# Patient Record
Sex: Female | Born: 1960
Health system: Southern US, Community
[De-identification: ages and names within clinical notes are randomized; demographics above are authoritative.]

## PROBLEM LIST (undated history)

## (undated) DIAGNOSIS — F419 Anxiety disorder, unspecified: Secondary | ICD-10-CM

## (undated) DIAGNOSIS — J45909 Unspecified asthma, uncomplicated: Secondary | ICD-10-CM

## (undated) DIAGNOSIS — I6350 Cerebral infarction due to unspecified occlusion or stenosis of unspecified cerebral artery: Secondary | ICD-10-CM

## (undated) DIAGNOSIS — E559 Vitamin D deficiency, unspecified: Secondary | ICD-10-CM

## (undated) DIAGNOSIS — I63239 Cerebral infarction due to unspecified occlusion or stenosis of unspecified carotid arteries: Secondary | ICD-10-CM

## (undated) DIAGNOSIS — I10 Essential (primary) hypertension: Secondary | ICD-10-CM

## (undated) DIAGNOSIS — R51 Headache: Secondary | ICD-10-CM

## (undated) DIAGNOSIS — D649 Anemia, unspecified: Secondary | ICD-10-CM

## (undated) DIAGNOSIS — M62838 Other muscle spasm: Secondary | ICD-10-CM

## (undated) DIAGNOSIS — F329 Major depressive disorder, single episode, unspecified: Secondary | ICD-10-CM

## (undated) DIAGNOSIS — R12 Heartburn: Secondary | ICD-10-CM

## (undated) DIAGNOSIS — E039 Hypothyroidism, unspecified: Secondary | ICD-10-CM

## (undated) DIAGNOSIS — F32A Depression, unspecified: Secondary | ICD-10-CM

## (undated) DIAGNOSIS — G43909 Migraine, unspecified, not intractable, without status migrainosus: Secondary | ICD-10-CM

## (undated) DIAGNOSIS — E7800 Pure hypercholesterolemia, unspecified: Secondary | ICD-10-CM

## (undated) HISTORY — DX: Other muscle spasm: M62.838

## (undated) HISTORY — DX: Cerebral infarction due to unspecified occlusion or stenosis of unspecified carotid artery: I63.239

## (undated) HISTORY — DX: Migraine, unspecified, not intractable, without status migrainosus: G43.909

## (undated) HISTORY — PX: VAGINAL HYSTERECTOMY: SUR661

## (undated) HISTORY — DX: Anxiety disorder, unspecified: F41.9

## (undated) HISTORY — DX: Cerebral infarction due to unspecified occlusion or stenosis of unspecified cerebral artery: I63.50

## (undated) HISTORY — PX: COSMETIC SURGERY: SHX468

## (undated) HISTORY — DX: Essential (primary) hypertension: I10

## (undated) HISTORY — DX: Vitamin D deficiency, unspecified: E55.9

## (undated) HISTORY — PX: NASAL SINUS SURGERY: SHX719

## (undated) HISTORY — DX: Anemia, unspecified: D64.9

---

## 1997-06-24 ENCOUNTER — Other Ambulatory Visit: Admission: RE | Admit: 1997-06-24 | Discharge: 1997-06-24 | Payer: Self-pay | Admitting: Obstetrics and Gynecology

## 1998-03-29 HISTORY — PX: AUGMENTATION MAMMAPLASTY: SUR837

## 1998-08-20 ENCOUNTER — Encounter: Payer: Self-pay | Admitting: Internal Medicine

## 1998-08-20 ENCOUNTER — Ambulatory Visit (HOSPITAL_COMMUNITY): Admission: RE | Admit: 1998-08-20 | Discharge: 1998-08-20 | Payer: Self-pay | Admitting: Internal Medicine

## 2000-01-08 ENCOUNTER — Other Ambulatory Visit: Admission: RE | Admit: 2000-01-08 | Discharge: 2000-01-08 | Payer: Self-pay | Admitting: Obstetrics and Gynecology

## 2000-02-17 ENCOUNTER — Ambulatory Visit (HOSPITAL_COMMUNITY): Admission: RE | Admit: 2000-02-17 | Discharge: 2000-02-17 | Payer: Self-pay | Admitting: Internal Medicine

## 2000-02-17 ENCOUNTER — Encounter: Payer: Self-pay | Admitting: Internal Medicine

## 2000-06-10 ENCOUNTER — Encounter: Payer: Self-pay | Admitting: Internal Medicine

## 2000-06-10 ENCOUNTER — Ambulatory Visit (HOSPITAL_COMMUNITY): Admission: RE | Admit: 2000-06-10 | Discharge: 2000-06-10 | Payer: Self-pay | Admitting: Internal Medicine

## 2000-06-13 ENCOUNTER — Encounter: Admission: RE | Admit: 2000-06-13 | Discharge: 2000-06-13 | Payer: Self-pay | Admitting: Internal Medicine

## 2000-06-13 ENCOUNTER — Encounter: Payer: Self-pay | Admitting: Internal Medicine

## 2000-08-31 ENCOUNTER — Ambulatory Visit (HOSPITAL_COMMUNITY): Admission: RE | Admit: 2000-08-31 | Discharge: 2000-08-31 | Payer: Self-pay | Admitting: Internal Medicine

## 2000-08-31 ENCOUNTER — Encounter: Payer: Self-pay | Admitting: Internal Medicine

## 2001-06-15 ENCOUNTER — Encounter: Payer: Self-pay | Admitting: Obstetrics and Gynecology

## 2001-06-15 ENCOUNTER — Ambulatory Visit (HOSPITAL_COMMUNITY): Admission: RE | Admit: 2001-06-15 | Discharge: 2001-06-15 | Payer: Self-pay | Admitting: Obstetrics and Gynecology

## 2001-06-20 ENCOUNTER — Encounter
Admission: RE | Admit: 2001-06-20 | Discharge: 2001-06-20 | Payer: Self-pay | Admitting: Physical Medicine & Rehabilitation

## 2001-06-20 ENCOUNTER — Encounter: Payer: Self-pay | Admitting: Physical Medicine & Rehabilitation

## 2002-01-23 ENCOUNTER — Other Ambulatory Visit: Admission: RE | Admit: 2002-01-23 | Discharge: 2002-01-23 | Payer: Self-pay | Admitting: Internal Medicine

## 2002-02-20 ENCOUNTER — Ambulatory Visit (HOSPITAL_BASED_OUTPATIENT_CLINIC_OR_DEPARTMENT_OTHER): Admission: RE | Admit: 2002-02-20 | Discharge: 2002-02-20 | Payer: Self-pay | Admitting: Orthopedic Surgery

## 2002-10-03 ENCOUNTER — Ambulatory Visit (HOSPITAL_COMMUNITY): Admission: RE | Admit: 2002-10-03 | Discharge: 2002-10-03 | Payer: Self-pay | Admitting: Obstetrics and Gynecology

## 2002-10-03 ENCOUNTER — Encounter: Payer: Self-pay | Admitting: Obstetrics and Gynecology

## 2002-11-08 ENCOUNTER — Emergency Department (HOSPITAL_COMMUNITY): Admission: EM | Admit: 2002-11-08 | Discharge: 2002-11-08 | Payer: Self-pay | Admitting: Emergency Medicine

## 2002-11-08 ENCOUNTER — Encounter: Payer: Self-pay | Admitting: Emergency Medicine

## 2002-11-20 ENCOUNTER — Encounter: Admission: RE | Admit: 2002-11-20 | Discharge: 2002-11-20 | Payer: Self-pay | Admitting: *Deleted

## 2002-11-20 ENCOUNTER — Encounter: Payer: Self-pay | Admitting: *Deleted

## 2002-12-27 ENCOUNTER — Other Ambulatory Visit: Admission: RE | Admit: 2002-12-27 | Discharge: 2002-12-27 | Payer: Self-pay | Admitting: Internal Medicine

## 2003-09-19 ENCOUNTER — Encounter: Admission: RE | Admit: 2003-09-19 | Discharge: 2003-09-19 | Payer: Self-pay | Admitting: Orthopedic Surgery

## 2003-10-11 ENCOUNTER — Encounter: Admission: RE | Admit: 2003-10-11 | Discharge: 2003-10-11 | Payer: Self-pay | Admitting: Orthopedic Surgery

## 2004-03-31 ENCOUNTER — Ambulatory Visit (HOSPITAL_COMMUNITY): Admission: RE | Admit: 2004-03-31 | Discharge: 2004-03-31 | Payer: Self-pay | Admitting: Internal Medicine

## 2004-11-09 ENCOUNTER — Ambulatory Visit (HOSPITAL_COMMUNITY): Admission: RE | Admit: 2004-11-09 | Discharge: 2004-11-09 | Payer: Self-pay | Admitting: Internal Medicine

## 2005-01-06 ENCOUNTER — Other Ambulatory Visit: Admission: RE | Admit: 2005-01-06 | Discharge: 2005-01-06 | Payer: Self-pay | Admitting: Internal Medicine

## 2005-02-25 ENCOUNTER — Ambulatory Visit (HOSPITAL_COMMUNITY): Admission: RE | Admit: 2005-02-25 | Discharge: 2005-02-25 | Payer: Self-pay | Admitting: Internal Medicine

## 2005-05-05 ENCOUNTER — Ambulatory Visit (HOSPITAL_COMMUNITY): Admission: RE | Admit: 2005-05-05 | Discharge: 2005-05-06 | Payer: Self-pay | Admitting: Surgery

## 2005-05-05 ENCOUNTER — Encounter (INDEPENDENT_AMBULATORY_CARE_PROVIDER_SITE_OTHER): Payer: Self-pay | Admitting: *Deleted

## 2005-05-31 ENCOUNTER — Ambulatory Visit (HOSPITAL_COMMUNITY): Admission: RE | Admit: 2005-05-31 | Discharge: 2005-05-31 | Payer: Self-pay | Admitting: Internal Medicine

## 2005-09-14 ENCOUNTER — Ambulatory Visit (HOSPITAL_COMMUNITY): Admission: RE | Admit: 2005-09-14 | Discharge: 2005-09-14 | Payer: Self-pay | Admitting: Internal Medicine

## 2007-06-23 ENCOUNTER — Ambulatory Visit (HOSPITAL_COMMUNITY): Admission: RE | Admit: 2007-06-23 | Discharge: 2007-06-23 | Payer: Self-pay | Admitting: Internal Medicine

## 2007-07-20 ENCOUNTER — Other Ambulatory Visit: Admission: RE | Admit: 2007-07-20 | Discharge: 2007-07-20 | Payer: Self-pay | Admitting: Obstetrics and Gynecology

## 2007-08-29 ENCOUNTER — Ambulatory Visit: Payer: Self-pay | Admitting: Internal Medicine

## 2007-08-29 ENCOUNTER — Inpatient Hospital Stay (HOSPITAL_COMMUNITY): Admission: EM | Admit: 2007-08-29 | Discharge: 2007-09-04 | Payer: Self-pay | Admitting: Emergency Medicine

## 2007-08-29 ENCOUNTER — Encounter (INDEPENDENT_AMBULATORY_CARE_PROVIDER_SITE_OTHER): Payer: Self-pay | Admitting: Pediatrics

## 2007-09-01 ENCOUNTER — Encounter (INDEPENDENT_AMBULATORY_CARE_PROVIDER_SITE_OTHER): Payer: Self-pay | Admitting: Pediatrics

## 2007-09-01 ENCOUNTER — Ambulatory Visit: Payer: Self-pay | Admitting: Physical Medicine & Rehabilitation

## 2007-09-04 ENCOUNTER — Inpatient Hospital Stay (HOSPITAL_COMMUNITY)
Admission: RE | Admit: 2007-09-04 | Discharge: 2007-09-21 | Payer: Self-pay | Admitting: Physical Medicine & Rehabilitation

## 2007-09-25 ENCOUNTER — Encounter
Admission: RE | Admit: 2007-09-25 | Discharge: 2007-12-24 | Payer: Self-pay | Admitting: Physical Medicine & Rehabilitation

## 2007-10-24 ENCOUNTER — Encounter: Admission: RE | Admit: 2007-10-24 | Discharge: 2007-10-24 | Payer: Self-pay | Admitting: Orthopedic Surgery

## 2007-10-26 ENCOUNTER — Ambulatory Visit: Payer: Self-pay | Admitting: Physical Medicine & Rehabilitation

## 2007-10-26 ENCOUNTER — Encounter
Admission: RE | Admit: 2007-10-26 | Discharge: 2008-01-01 | Payer: Self-pay | Admitting: Physical Medicine & Rehabilitation

## 2007-11-09 ENCOUNTER — Encounter: Admission: RE | Admit: 2007-11-09 | Discharge: 2007-11-09 | Payer: Self-pay | Admitting: Orthopedic Surgery

## 2007-12-01 ENCOUNTER — Encounter: Admission: RE | Admit: 2007-12-01 | Discharge: 2007-12-01 | Payer: Self-pay | Admitting: Orthopedic Surgery

## 2007-12-25 ENCOUNTER — Encounter
Admission: RE | Admit: 2007-12-25 | Discharge: 2008-02-19 | Payer: Self-pay | Admitting: Physical Medicine & Rehabilitation

## 2008-01-03 ENCOUNTER — Encounter
Admission: RE | Admit: 2008-01-03 | Discharge: 2008-01-10 | Payer: Self-pay | Admitting: Physical Medicine & Rehabilitation

## 2008-01-04 ENCOUNTER — Ambulatory Visit: Payer: Self-pay | Admitting: Physical Medicine & Rehabilitation

## 2008-01-04 ENCOUNTER — Encounter
Admission: RE | Admit: 2008-01-04 | Discharge: 2008-01-04 | Payer: Self-pay | Admitting: Physical Medicine & Rehabilitation

## 2008-01-16 ENCOUNTER — Encounter: Admission: RE | Admit: 2008-01-16 | Discharge: 2008-02-13 | Payer: Self-pay | Admitting: Psychology

## 2008-04-19 ENCOUNTER — Encounter
Admission: RE | Admit: 2008-04-19 | Discharge: 2008-07-18 | Payer: Self-pay | Admitting: Physical Medicine & Rehabilitation

## 2008-04-25 ENCOUNTER — Ambulatory Visit: Payer: Self-pay | Admitting: Physical Medicine & Rehabilitation

## 2008-07-22 ENCOUNTER — Encounter
Admission: RE | Admit: 2008-07-22 | Discharge: 2008-07-24 | Payer: Self-pay | Admitting: Physical Medicine & Rehabilitation

## 2008-07-24 ENCOUNTER — Ambulatory Visit: Payer: Self-pay | Admitting: Physical Medicine & Rehabilitation

## 2008-11-13 ENCOUNTER — Ambulatory Visit: Payer: Self-pay | Admitting: Physical Medicine & Rehabilitation

## 2008-11-13 ENCOUNTER — Encounter
Admission: RE | Admit: 2008-11-13 | Discharge: 2009-02-11 | Payer: Self-pay | Admitting: Physical Medicine & Rehabilitation

## 2008-11-27 ENCOUNTER — Ambulatory Visit (HOSPITAL_COMMUNITY): Admission: RE | Admit: 2008-11-27 | Discharge: 2008-11-27 | Payer: Self-pay | Admitting: Obstetrics and Gynecology

## 2009-01-17 ENCOUNTER — Ambulatory Visit: Payer: Self-pay | Admitting: Physical Medicine & Rehabilitation

## 2009-07-11 ENCOUNTER — Encounter
Admission: RE | Admit: 2009-07-11 | Discharge: 2009-07-15 | Payer: Self-pay | Admitting: Physical Medicine & Rehabilitation

## 2009-07-15 ENCOUNTER — Ambulatory Visit: Payer: Self-pay | Admitting: Physical Medicine & Rehabilitation

## 2009-10-28 ENCOUNTER — Other Ambulatory Visit: Admission: RE | Admit: 2009-10-28 | Discharge: 2009-10-28 | Payer: Self-pay | Admitting: Internal Medicine

## 2009-11-03 ENCOUNTER — Encounter (INDEPENDENT_AMBULATORY_CARE_PROVIDER_SITE_OTHER): Payer: Self-pay | Admitting: *Deleted

## 2009-11-28 ENCOUNTER — Ambulatory Visit (HOSPITAL_COMMUNITY): Admission: RE | Admit: 2009-11-28 | Discharge: 2009-11-28 | Payer: Self-pay | Admitting: Internal Medicine

## 2010-03-04 ENCOUNTER — Encounter (INDEPENDENT_AMBULATORY_CARE_PROVIDER_SITE_OTHER): Payer: Self-pay | Admitting: Obstetrics and Gynecology

## 2010-03-04 ENCOUNTER — Ambulatory Visit (HOSPITAL_COMMUNITY)
Admission: RE | Admit: 2010-03-04 | Discharge: 2010-03-05 | Payer: Self-pay | Source: Home / Self Care | Attending: Obstetrics and Gynecology | Admitting: Obstetrics and Gynecology

## 2010-04-30 NOTE — Letter (Signed)
Summary: Previsit letter  [facility]  [address] [place], [place] [address]   Phone: [phone]  Fax: [phone]       11/03/2009 MRN: [id number]  Ruth Gregory [address] [place], [place]  [address]  Dear Ms. Burrous,  Welcome to the Gastroenterology Division at [facility].    You are scheduled to see a nurse for your pre-procedure visit on 12-03-09 at 11AM on the 3rd floor at [facility], [address].  We ask that you try to arrive at our office 15 minutes prior to your appointment time to allow for check-in.  Your nurse visit will consist of discussing your medical and surgical history, your immediate family medical history, and your medications.    Please bring a complete list of all your medications or, if you prefer, bring the medication bottles and we will list them.  We will need to be aware of both prescribed and over the counter drugs.  We will need to know exact dosage information as well.  If you are on blood thinners (Coumadin, Plavix, Aggrenox, Ticlid, etc.) please call our office today/prior to your appointment, as we need to consult with your physician about holding your medication.   Please be prepared to read and sign documents such as consent forms, a financial agreement, and acknowledgement forms.  If necessary, and with your consent, a friend or relative is welcome to sit-in on the nurse visit with you.  Please bring your insurance card so that we may make a copy of it.  If your insurance requires a referral to see a specialist, please bring your referral form from your primary care physician.  No co-pay is required for this nurse visit.     If you cannot keep your appointment, please call [phone] to cancel or reschedule prior to your appointment date.  This allows [place] the opportunity to schedule an appointment for another patient in need of care.    Thank you for choosing [facility] for your medical needs.  We  appreciate the opportunity to care for you.  Please visit [place] at our website  to learn more about our practice.                     Sincerely.                                                                                                                   The Gastroenterology Division

## 2010-06-09 LAB — PREGNANCY, URINE: Preg Test, Ur: NEGATIVE

## 2010-06-09 LAB — BASIC METABOLIC PANEL
BUN: 19 mg/dL (ref 6–23)
CO2: 26 mEq/L (ref 19–32)
Calcium: 9.7 mg/dL (ref 8.4–10.5)
Chloride: 106 mEq/L (ref 96–112)
Creatinine, Ser: 1.12 mg/dL (ref 0.4–1.2)
GFR calc Af Amer: >60 mL/min (ref >60)
GFR calc non Af Amer: 51 mL/min — ABNORMAL LOW (ref >60)
Glucose, Bld: 66 mg/dL — ABNORMAL LOW (ref 70–99)
Potassium: 4.2 mEq/L (ref 3.5–5.1)
Sodium: 139 mEq/L (ref 135–145)

## 2010-06-09 LAB — CBC
HCT: 32.2 % — ABNORMAL LOW (ref 36.0–46.0)
HCT: 37.8 % (ref 36.0–46.0)
Hemoglobin: 11.4 g/dL — ABNORMAL LOW (ref 12.0–15.0)
Hemoglobin: 13.2 g/dL (ref 12.0–15.0)
MCH: 33.3 pg (ref 26.0–34.0)
MCH: 33.8 pg (ref 26.0–34.0)
MCHC: 34.8 g/dL (ref 30.0–36.0)
MCHC: 35.3 g/dL (ref 30.0–36.0)
MCV: 95.6 fL (ref 78.0–100.0)
MCV: 95.7 fL (ref 78.0–100.0)
Platelets: 164 10*3/uL (ref 150–400)
Platelets: 176 10*3/uL (ref 150–400)
RBC: 3.36 MIL/uL — ABNORMAL LOW (ref 3.87–5.11)
RBC: 3.96 MIL/uL (ref 3.87–5.11)
RDW: 12.3 % (ref 11.5–15.5)
RDW: 12.4 % (ref 11.5–15.5)
WBC: 10 10*3/uL (ref 4.0–10.5)
WBC: 4.6 10*3/uL (ref 4.0–10.5)

## 2010-06-09 LAB — SURGICAL PCR SCREEN
MRSA, PCR: NEGATIVE
Staphylococcus aureus: NEGATIVE

## 2010-08-11 NOTE — H&P (Signed)
NAME:  Ruth Gregory, Ruth Gregory NO.:  [id number]   MEDICAL RECORD NO.:  [id number]          PATIENT TYPE:  INP   LOCATION:  3107                         FACILITY:  [facility]   PHYSICIAN:  Deanna Artis. Hickling, M.D.DATE OF BIRTH:  Dec 20, 1960   DATE OF ADMISSION:  08/28/2007  DATE OF DISCHARGE:                              HISTORY & PHYSICAL   CHIEF COMPLAINT:  Left hemiparesis.   HISTORY OF PRESENT CONDITION:  This is a 50 year old woman last known  normal at 2145 hours.  Presented for her fall when she got out of her  chair around at 2230 hours.  She arrived as a code stroke, time of onset  unknown (at 2345 hours).  She had a CT scan at 1231 and this was  reported at 1250 to Dr. Rubin Payor who called me at 1254.  The patient  had a dense right middle cerebral artery sign that was evident without  evidence of cerebral edema.   PAST MEDICAL HISTORY:  Migraine headaches.  The patient had a migraine  this morning and required treatment at [facility].  She  recovered completely.   PAST SURGICAL HISTORY:  Breast implants and goiter removed.   PAST MEDICAL HISTORY:  Also remarkable for hypothyroidism.   CURRENT MEDICATIONS:  Synthroid.  The patient was on prophylactic  migraine medicine.   DRUG ALLERGIES:  PENICILLIN.   FAMILY HISTORY:  Negative for migraines and stroke.   SOCIAL HISTORY:  The patient has been married for 18 years.  She has 2  children.  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  She has had 1 year of college.  She does not use  tobacco.  Drinks alcohol sparingly.  Does not use drugs.   REVIEW OF SYSTEMS:  Twelve systems review is remarkable only for her  migraine and her hypothyroidism.   PHYSICAL EXAMINATION:  VITAL SIGNS:  Temperature 97.1, blood pressure  119/77, heart rate 77, respirations 20, oxygen saturation 97%, height 5  feet 10 inches, weight 157 pounds.  The patient has no cranial or  cervical bruits.   No signs of infection.  LUNGS:  Clear.  HEART:  No murmurs.  Pulses normal.  ABDOMEN:  Bowel sounds normal.  No hepatosplenomegaly.  NEUROLOGIC:  The patient is awake and alert.  She has no aphasia.  She  has dysarthria.  She has a right gaze preference and left gaze paresis,  but not forced duction of her eyes.  She has a dense left homonymous  hemianopsia.  She has a dense left central seventh.  She can protrude  her tongue at midline.  She is plegic on the left side.  She has a  flaccid left arm and an increased tone in the left leg.  She has a left  extensor plantar response.  Reflexes show a left reflex predominance.  She does not have dense neglect of the left arm but neglected to double  simultaneous stimuli.   Her NIH stroke scale was 19.  Her premorbid modified Rankin score was  0.   TEST RESULTS:  Sodium 139, potassium 3.9, chloride 106, CO2 22, BUN 13,  creatinine 1.0, glucose 130.  Hemoglobin 14.1, hematocrit 40.3, white  count 8400, platelets 141,000, MCV 95.3.  PT/PTT is drawn and is pending  at this time.   IMPRESSION:  Embolic infarction to the Right Middle Cerebral Artery  evaluate for right internal carotid disease such as fibromuscular  dysplasia or carotid dissection.  (434.11)   PLAN:  The patient received a bolus dose of t-PA at 1:50 a.m. and two-  third t-PA is being drifting over an hour.  She will have an angiogram  and then proceed to either into extraction device, either Merci or  Penumbra.  Her condition is serious at this time.  I have informed her  husband that the risks of bleeding and possible death as a result of  progression of her stroke with herniation.  She will be placed in  intensive care unit after her procedure.      Deanna Artis. Sharene Skeans, M.D.  Electronically Signed     [facility]/MEDQ  D:  08/29/2007  T:  08/29/2007  Job:  [id number]

## 2010-08-11 NOTE — Discharge Summary (Signed)
NAME:  Ruth Gregory, Ruth Gregory NO.:  [id number]   MEDICAL RECORD NO.:  [id number]          PATIENT TYPE:  IPS   LOCATION:  4033                         FACILITY:  [facility]   PHYSICIAN:  Ellwood Dense, M.D.   DATE OF BIRTH:  September 08, 1960   DATE OF ADMISSION:  09/04/2007  DATE OF DISCHARGE:  09/21/2007                               DISCHARGE SUMMARY   DISCHARGE DIAGNOSES:  1. 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  2. Dysphagia.  3. History of migraine headaches.  4. Hyperlipidemia.  5. Hypothyroidism.  6. Subcutaneous Lovenox for deep vein thrombosis prophylaxis.   HISTORY OF PRESENT ILLNESS:  This is a 50 year old female with a history  of migraine headaches, admitted on August 29, 2007 with left-sided  weakness.  MRI showed right middle cerebral artery infarction.  Angiogram showed occluded right internal carotid artery and large clot  to the right internal carotid artery.  Underwent stenting and  revascularization per Dr. Corliss Skains.  Neurology services, Dr. Sharene Skeans.  The patient did receive TPA.  Remained intubated with a slow wean.  Maintained on aspirin and subcutaneous Lovenox.  No Plavix or Coumadin  secondary to a small hemorrhagic bleed identified on follow up cranial  CT scan after stenting on August 31, 2007.  Modified barium swallow placed  on a dysphagia one nectar thick liquid diet.  Depakote added for severe  migraine headaches.   PAST MEDICAL HISTORY:  See discharge diagnoses.  Occasional alcohol.  No  tobacco.   ALLERGIES:  PENICILLIN AND SULFA.   SOCIAL HISTORY:  Married.  She works as a Contractor at [employer].  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  Spouse can assist on discharge in a one  level home with two steps to entry.   MEDICATIONS PRIOR TO ADMISSION:  1. Synthroid.  2. Zomig for migraine headaches.  3. Lexapro.  4. Xanax.  5. Wellbutrin.  6. Birth control pills.  7. Oxycodone immediate release  as needed for migraine headaches.   REHABILITATION HOSPITAL COURSE:  The patient was admitted to Inpatient  Rehab Services with therapies initiated on a 3-hour daily basis  consisting of physical therapy, occupational therapy, speech therapy,  and rehabilitation nursing.  The following issues were addressed during  the patient's rehabilitation stay.  Pertaining to Ms. Macha's right  middle cerebral artery infarction with right internal carotid artery  dissection.  She remained stable participating in therapy.  She would  follow up with Dr. Sharene Skeans of neurology services as well as Dr.  Corliss Skains on discharge.  She was minimal assist for transfers, minimum-  to-moderate assist for ambulation, minimal assist with standing balance,  minimum-to-moderate assist for stairs, minimal assist for bathing,  minimal assist upper body dressing as well as lower body dressing.  Home  health therapies would be ongoing as per [employer].  She was  continent of bowel and bladder.  She was followed by speech therapy with  her diet status slowly advanced to a regular consistency, which she  tolerated well.  She remained on  Depakote at bedtime as well as Percocet  as needed for migraine headaches.  TriCor for hyperlipidemia with latest  cholesterol of 158 and triglycerides of 572.  She remained on Synthroid  for hypothyroidism.  Subcutaneous Lovenox for deep vein thrombosis  prophylaxis was discontinued time of discharge.  She would remain on  aspirin therapy for cerebrovascular accident prophylaxis.   Latest labs showed hemoglobin 14, hematocrit of 40, and platelet  243,000.  Sodium 140, potassium 4.3, BUN 6, and creatinine 0.8.  Liver  function studies were within normal limits.  Full family teaching was  completed and she was discharged to home with her family.   DISCHARGE MEDICATIONS AT TIME OF DICTATION:  1. TriCor 145 mg daily.  2. Synthroid 100 mcg daily.  3. Protonix 40 mg daily.  4. Aspirin  325 mg daily.  5. Depakote 500 mg at bedtime.  6. Magnesium oxide 400 mg daily.  7. Vitamin D 2000 international units daily.  8. Wellbutrin XL 300 mg daily.  9. Percocet 5/325 mg 1 or 2 tablets every 4 hours as needed pain      dispense of 60 tablets.   DIET:  Regular.   SPECIAL INSTRUCTIONS:  No smoking, no driving, and no alcohol.  Follow  up with Dr. Ellwood Dense at the Outpatient Rehab Service Office as  advised, Dr. Sharene Skeans neurology services [phone] call for appointment,  Dr. Corliss Skains interventional radiology, and Dr. Oneta Rack Medical  Management.      Mariam Dollar, P.A.    ______________________________  Ellwood Dense, M.D.    DA/MEDQ  D:  09/20/2007  T:  09/21/2007  Job:  [id number]   cc:   Ellwood Dense, M.D.  Lucky Cowboy, M.D.  Deanna Artis. Sharene Skeans, M.D.  Sanjeev K. Corliss Skains, M.D.

## 2010-08-11 NOTE — Assessment & Plan Note (Signed)
[name] returns to the clinic today for followup evaluation  accompanied by her husband.  The patient is a 50 year old female with  history of migraine headaches.  She was admitted on August 29, 2007, with  left-sided weakness.  MRI study showed a right middle cerebral artery  infarction.  Angiography showed an occluded right internal carotid  artery and a large clot in the right internal carotid artery.  She  underwent stenting and revascularization by Dr. Corliss Skains.  Dr. Sharene Skeans  saw the patient and she received TPA.  She remained intubated with a  slow gradual wean from the ventilator.   The patient eventually stabilized and was moved to the rehabilitation on  September 04, 2007.  She remained there through discharge on September 21, 2007.   Since discharge, the patient has been attending outpatient physical,  occupational, and speech therapy 3 times per week at the [facility]  address.  She has been making good progress and actually has already met  her 8-week goals at a 4 week duration.  She ambulates with a single-  point cane only when she is in crowds and otherwise is without any  assistive device.  She is independent with bathing and dressing,  incontinent of bowel and bladder.  She does report occasional nausea and  occasional headaches.  She does have some pain in the left shoulder and  arm along with her wrist and takes Percocet for that along with her  headache pain.   The patient did undergo epidural steroid injection in her lumbar spine  with Dr. Rose Fillers this week.  She is hoping for improvement in her chronic  back pain.   The patient has not returned to work and is doing officially no work at  home at this point.  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   MEDICATIONS:  1. TriCor 134 mg daily.  2. Synthroid 100 mcg p.o. daily.  3. Depakote 250 mg b.i.d.  4. Wellbutrin XL 300 mg  daily.  5. Oxycodone 5/325 mg one to two tablets b.i.d. p.r.n.  6. Aspirin 325 mg daily.  7. Magnesium oxide 400 mg daily.  8. Vitamin D 6000 mg daily.  9. Alprazolam 1 mg one quarter to one half tablets p.r.n.   REVIEW OF SYSTEMS:  Positive for limb swelling over her left arm.   PHYSICAL EXAMINATION:  A well-appearing adult female in mild acute  discomfort involving her left upper extremity.  Blood pressure and  vitals were not obtained in the office today.  She has 5/5 strength in  the right upper and right lower extremity.  Left upper extremity  strength was 4-/5 in shoulder abduction and 4-/5 in elbow flexion and  3+/5 in wrist extension with grip of 3/5.  Left lower extremity strength  otherwise generally 4-/5 throughout.  Sensation was intact to light  touch throughout the left upper and left lower extremity.   IMPRESSION:  1. Status post right middle cerebral artery infarct and right internal      carotid artery dissection with subsequent stenting.  2. Left hemiparesis related to status post right middle cerebral      artery infarct and right internal carotid artery dissection with      subsequent stenting.  3. Dyslipidemia.  4. Hypothyroidism.  5. Chronic migraine headache pain.  6. Left shoulder pain related to stroke.   In  the office today, no refill on medication is necessary.  We will plan  on seeing the patient in followup in approximately 2 months' time to see  how she is progressing in therapy.  At this point, she is not interested  in starting any official work, but I have encouraged her to go onto the  website at her employer to at least keep in contact with what is going  on in the industry.  In the meantime, we will continue to fill the  Percocet for her as necessary for her left shoulder pain.  I have asked  her to use that only if absolutely necessary, and she is already doing  so.  We will plan seeing her in followup as noted above.            ______________________________  Ellwood Dense, M.D.     DC/MedQ  D:  10/27/2007 11:33:14  T:  10/28/2007 02:46:07  Job #:  [id number]

## 2010-08-11 NOTE — Assessment & Plan Note (Signed)
HISTORY OF PRESENT ILLNESS:  Ruth Gregory is back regarding her right MCA stroke.  She is still having some issues with her left hand and anything she  feels that she is regressed since I last saw her.  She was getting bored  with home exercises.  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  Husband states that she is really not putting in the  time that she once did.  She is not taking any medications.  She is  wearing a splint at night for her hand.  She asks me if there are any  other options for her hands in general.  Her gait is generally stable.  She has denies any other complaints.  She denies depression and anxiety.  Husband does note that she is having more problems with attention and  focus than she did prior to the stroke which was an issue premorbidly as  well.   REVIEW OF SYSTEMS:  Notable for the above.  Full 14 point review is in  the written health and history section of the chart.   SOCIAL HISTORY:  The patient is married and husband is with her today.   PHYSICAL EXAMINATION:  VITAL SIGNS:  Blood pressure is 139/88, pulse is  83, respiratory rate 16.  She is sating 99% on room air.  GENERAL:  The patient is pleasant, alert and oriented x3.  NEURO:  She is still a bit flat, has a mild left central VII but speech  is clear with minimal to no dysarthria.  Intention is fair to the left.  She has 1/4 tone in the right thumb flexors as well as the finger  flexors of the left hand.  She has difficulty with abduction of the  fingers and intrinsic movements in a fluid manner.  Wrist extension is a  little weak as well.  I grade her intrinsic muscle movement at 2-2+/5,  at the wrist at 3/5.  Biceps, triceps as well as shoulder strength is  4/5 if more.  Sensation still may be a bit decreased in the left upper  extremity 1+/2.  Cognitively, she has good insight and awareness.  Memory was functional.   ASSESSMENT:  1. Right middle cerebral artery stroke with left  spastic hemiparesis,      hemisensory loss with inattention.  2. History of migraine headaches.   PLAN:  1. We will start a trial of baclofen 5 mg b.i.d. titrating up to 10 mg      t.i.d. over 3 weeks' time.  If she has any intolerance or any lack      of progress with the baclofen, we can look at Botox to her hand      intrinsic muscles.  2. Discussed particular exercises that she may employ to work on range      of motion and use of the left hand.  She will need to also come up      with some on her own.  3. We will schedule followup with her in about 6 weeks' time.      Ranelle Oyster, M.D.  Electronically Signed     ZTS/MedQ  D:  11/13/2008 15:16:00  T:  11/14/2008 06:26:19  Job #:  [id number]

## 2010-08-11 NOTE — Assessment & Plan Note (Signed)
Ruth Gregory returns to the clinic today for followup evaluation.  We last  saw her in this office on January 04, 2008, after a right middle cerebral  artery infarct with right internal carotid artery dissection and  subsequent stenting.  She has completed all therapy back in December,  but now is attending outpatient therapy [facility] approximately  2 times per week for her left upper extremity.  She has started back to  work in January 2010 and is now working approximately one-half days.  She reports that her motor skills had not completely returned in her  left hand.  She does have trouble fully extending her left thumb, and  her left index finger, but can make a reasonable fist with strength of  approximately 3+/5.  Otherwise, the patient is doing well and reports  minimal pain and is not taking oxycodone at all at this point.   MEDICATIONS:  1. TriCor 134 mg daily.  2. Synthroid 100 mcg p.o. daily.  3. Wellbutrin XL 300 mg daily.  4. Aspirin 325 mg daily.  5. Magnesium oxide 400 mg daily.  6. Vitamin D 6000 mg daily.  7. Alprazolam 1 mg one-quarter to one-half tablet daily p.r.n.   REVIEW OF SYSTEMS:  Noncontributory.   The patient reports that pain is interfering with traveling, social  life, and homemaking along with some personal care.   PHYSICAL EXAMINATION:  GENERAL:  Well-appearing adult female in mild-to-  no acute discomfort.  VITAL SIGNS:  Blood pressure 122/79 with pulse of 100, respiratory rate  18, and O2 saturation 100% on room air.  EXTREMITIES:  Right upper and right lower extremity strength were 5/5.  Left lower extremity strength was 5-/5, and left upper extremity  strength was generally 4-/5 throughout.  The patient has slightly  decreased full extension of her left distal thumb and left index finger.   IMPRESSION:  1. Status post right middle cerebral artery infarct and right internal      carotid artery dissection with subsequent stenting.  2.  Dyslipidemia.  3. Hypertension.  4. Chronic migraine headaches.   In the office today, we did discuss at her request of possible Botox  injections of her left medial forearm.  We will be seeking approval for  that injection of 200 IU through her insurance company.  Ideally, at  this point, we would aim for the flexor pollicis longus and flexor  indices to try to weaken those, so that she can fully extend those 2  fingers.  Hopefully, we will get approval for the Botox injections over  the next several days.  We will call in for an appointment for the  injections.  She will be continuing therapy at this point and has made  excellent progress overall.           ______________________________  Ellwood Dense, M.D.     DC/MedQ  D:  04/25/2008 11:30:18  T:  04/26/2008 01:15:44  Job #:  [id number]

## 2010-08-11 NOTE — Assessment & Plan Note (Signed)
Ruth Gregory returns to the clinic today for followup evaluation.  She is a  50 year old female with history of migraine headaches, who was admitted  with left-sided weakness in June 2009.  MRI study showed an acute right  middle cerebral artery infarct.  She subsequently underwent stenting of  the right internal carotid artery.  She received tPA during the  procedure.  She was eventually moved to Rehabilitation Unit on September 04, 2007 and remained there until discharge on September 21, 2007.   At this point, the patient continues in occupational therapy for the  left upper extremity.  She has completed physical therapy.  She feels  she may have plateaued in terms of cognitive progress with speech  therapy, but there is a cognitive test planned with Dr. Leonides Cave for  January 17, 2008.  They may resume therapy after that testing is  completed.   The patient reports that she continue to use the Percocet approximately  3-4 tablets per week in 1/2-tablet doses.  She reports that the pain is  essentially less in her left upper extremity, where she had extreme  sensitivity to even light touch.   In terms of work, she went into the office last Friday, but generally  works from home.  She plans to start going to the office possibly 2 days  per week in December 2009.  She is concerned about her driving ability  and plans to see an ophthalmologist for evaluation of visual fields.  She reports that therapist had told her that they no longer have the  driving simulator in the therapy office.  I wonder where the simulation  driving abouts are done at this time.   The patient did see Dr. Pearlean Brownie, who apparently stopped her Depakote,  which she was using for headaches.  She has been using Xanax along with  Trileptal and occasional doses of Prednisone with good relief.  She  still reports decreased sensation of her left face and her entire left  arm.  She complains of a sore throat, which she feels may have been  related to vocal cord trauma during intubation after her surgery.  She  plans to see an Ear, Nose, and Throat specialist on January 28, 2008.   MEDICATIONS:  1. TriCor 134 mg daily.  2. Synthroid 100 mcg p.o. daily.  3. Wellbutrin XL 300 mg daily.  4. Oxycodone 5/325 one to two tablets p.o. b.i.d. p.r.n. (3-4 tablets      per week).  5. Aspirin 325 mg daily.  6. Magnesium oxide 400 mg daily.  7. Vitamin D 6000 mg daily.  8. Alprazolam 1 mg one-quarter to one-half tablet daily p.r.n.   REVIEW OF SYSTEMS:  Positive for weight gain.   PHYSICAL EXAMINATION:  GENERAL:  Well-appearing adult female in mild  acute discomfort.  VITAL SIGNS:  Blood pressure is 135/87 with a pulse of 80, respiratory  rate, and O2 saturation 98% on room air.  MUSCULOSKELETAL:  She has 5/5 strength in the right upper and right  lower extremity.  Left upper extremity strength was 4-/5 in shoulder  abduction and elbow flexion and extension, and 3+/5 in wrist extension  with a grip of 3/5.  Left lower extremity strength was generally 4-/5 in  hip flexion and knee extension.   IMPRESSION:  1. Status post right middle cerebral artery infarct and right internal      carotid artery dissection with subsequent stenting.  2. Left hemiparesis related to right middle cerebral  artery infarct      and right internal carotid artery dissection with stenting.  3. Dyslipidemia.  4. Hypertension.  5. Chronic migraine headaches.  6. Left shoulder pain related to stroke.   In the office today, no adjustment of medication is necessary.  We did  ask her to ask the therapist about the location of the driving simulator  as that may be beneficial for her.  She plans to see an Ear, Nose, and  Throat doctor for her vocal cord weakness and occasionally hoarse  speech.  She will be continuing in occupational therapy with possible  continuation of speech therapy for cognition improvement depending on  the results of the above-noted  exam.  We will plan on seeing the patient  in followup in approximately 4 months' time.  She will be contacting us  if she needs a referal to a plastic surgeon related to prior breast  augmentation surgery.  We will plan on seeing the patient in followup as  noted above.           ______________________________  Ellwood Dense, M.D.     DC/MedQ  D:  01/04/2008 10:23:23  T:  01/05/2008 00:19:40  Job #:  [id number]

## 2010-08-11 NOTE — Discharge Summary (Signed)
NAME:  Ruth, Gregory NO.:  [id number]   MEDICAL RECORD NO.:  [id number]           PATIENT TYPE:   LOCATION:                                 FACILITY:   PHYSICIAN:  Casimiro Needle L. Reynolds, M.D.DATE OF BIRTH:  [birth year]   DATE OF ADMISSION:  DATE OF DISCHARGE:                               DISCHARGE SUMMARY   DIAGNOSES AT THE TIME OF DISCHARGE:  1. Right internal carotid artery dissection secondary to vomiting      after migraine status post intravenous tissue plasminogen      activator, cerebral angiogram, and 2 right internal carotid artery      stents.  2. Headache, severe post stroke with history of migraine headaches      with overall poor control.  3. Breast implants.  4. Goiter removed.  5. Hypothyroidism.   MEDICINES AT THE TIME OF DISCHARGE:  1. Synthroid 100 mcg a day.  2. Lovenox 40 mg subcu a day.  3. Depakote 500 mg ER nightly.   STUDIES PERFORMED:  1. CT of the brain on admission showed dense right MCA sign.  Follow      up CT shows a right basal ganglia hyperdensity following angiogram      and stents, question contrast versus hemorrhage.  2  Chest x-ray verifies ET tube placement and NG tube placement.  No  acute disease.  1. MRI of the brain shows right MCA anterior sylvian fissure and      lenticulostriate territory infarcts.  Small volume of hemorrhage      associated with a right basal ganglia and also small volume of      subarachnoid hemorrhage suspected in the right sylvian fissure.      Associated mass effect of the right hemisphere with left forehead      midline shift to 6-7 mm.  No ventriculomegaly.  2. MRA of the brain study degraded by motion, artifact, antegrade flow      and signal in all major vessels of the circle of Willis including      the right MCA suggesting interval recannulization of the proximal      right MCA occlusion.  Susceptibility artifact from a distal      cervical right ICA stent which appears patent.   Follow up CT in 24      hours shows evolutionary changes of moderate to large right MCA      infarct with increasing edema and mass effect and a new 5-mm right-      to-left midline shift.  3. Cervical spine x-ray shows no acute abnormality.  CT of the head 48      hours unchanged.  No hydrocephalus.  Last chest x-ray showed stable      mild bibasilar atelectasis.  4. Carotid Doppler shows no ICA stenosis.  ICA stent appears to be      patent.  Vertebral artery flow was antegrade.  No significant left      ICA stenosis.  A 2-D echocardiogram shows EF of 55%-65% with no      diagnostic  left ventricular regional wall motion abnormalities.      PFO cannot be excluded.  5. EKG shows normal sinus rhythm.  6. Transcranial Doppler completed, results pending.   LABORATORY STUDIES:  CBC with hemoglobin 11.1, hematocrit 31.1, and  platelets 121,000, otherwise normal chemistry with 0.5, creatinine 0.75,  and glucose 107.  Coags normal.  Liver function tests with alkaline  phosphatase 33 and albumin 3.2, otherwise normal.  Cholesterol 158,  triglycerides 572, HDL 26, and LDL unable to be calculated.  Calcium was  8.2.  Urinalysis was negative.  Hemoglobin A1c was 5.3 and homocystine  was 8.8   HISTORY OF PRESENT ILLNESS:  Ruth Gregory is a 50 year old right-handed  Caucasian female with a history of hypothyroidism following goiter  removal.  She presented to [facility] with a dense left  homonymous hemianopsia and right gaze preference and left hemiparesis.  Hemiparesis had suddenly started when she fell out of her chair around  10:30 p.m.  She was picked up by EMS and brought to [facility] has a  code stroke.  CT in the emergency room showed a dense right middle  cerebral sign that was evident without evidence of cerebral edema.  She  was given two-third IV t-PA and sent to the angiogram suite for  potential clot extraction.  She was found to have a dissected right  internal carotid  artery which was opened utilizing 2 stents.  By the  time they got to the MCA, the clot had dissipated and the procedure was  stopped.  She was then admitted to the Neuro ICU for further stroke  evaluation.   HOSPITAL COURSE:  The patient was intubated post stenting.  MRI  confirmed presence of a right MCA stroke and also presence of hemorrhage  following ICA stenting and attempt at Merci Retriever device.  She was  started on aspirin secondary to the stent and dissection.  Both Coumadin  and/or Plavix are not acceptable at this time secondary to presence of  hemorrhage.  The patient was eventually extubated after she was removed.  A swallow evaluation was done which allowed her to take a dysphasia one  nectar-thick liquid diet.  She was evaluated by PT and OT after she was  cleared from her C-spine standpoint secondary to fall prior to admission  and was felt to be a good rehab candidate.  Arrangements were made, and  the patient is to be transferred to rehab for continuation of therapies.  Of note, she continues with significant migraine in hospital.  She was  given Tylenol with Codeine No. 3 without much success.  They have  discharged.  She was started on Depakene for a prophylactic treatment as  well as headache as well as Percocet p.r.n.   CONDITION ON DISCHARGE:  The patient was alert in no acute distress,  normal speech.  Chest was clear to auscultation.  Heart rate is regular.  Extraocular movements are full.  Visual fields are full.  She has a left  upper extremity that is flaccid.  Left lower extremity is 2-3/5.   DISCHARGE PLAN:  1. Transfer to rehab for continuation of PT, OT, and speech therapy.  2. Aspirin for stroke prevention as well as post stent past treatment.      Preference would be to have aspirin and Plavix with Coumadin though      this is not possible secondary to hemorrhagic transformation.      Consider treatment adjustments at follow up with  neurologist.   3. Follow up primary care physician within 1 month of discharge from      rehab.  4. Follow up Dr. Delia Heady in 2-3 months.  5. Follow migraine progression, adjust meds as needed.      Ruth Gregory, N.P.      Marolyn Hammock. Thad Ranger, M.D.  Electronically Signed    SB/MEDQ  D:  09/04/2007  T:  09/05/2007  Job:  [id number]   cc:   Pramod P. Pearlean Brownie, MD

## 2010-08-11 NOTE — Assessment & Plan Note (Signed)
Ruth Gregory is back regarding her right MCA stroke.  She is still having some  frustration over lack of progress in her left hand and some issues with  occasional spasm.  She had seen Dr. Thomasena Edis most recently in January and  they were looking at the possibility of Botox injections for the left  wrist and fingers.  She was not real sure that she wanted to proceed  with this, although she did have approval for the injections.  She has  been receiving some extra therapy through [facility], which is  apparently associated with her work place.  She has been encouraged by  some of the progress there.  She tries using left hand as much as she  can.  She is driving,  but occasionally notes some issues with alertness  while on the road.  There is some associated anxiety as well.   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  She is married and has husband and 2  children.   REVIEW OF SYSTEMS:  Notable for some numbness in the left hand and some  pain in the left shoulder.  Otherwise pertinent positives are above and  full review is in the written health and history section of the chart.   PHYSICAL EXAMINATION:  Blood pressure is 130/78, pulse is 92,  respiratory rate 18, and she is sating 98% on room air.  The patient is  pleasant, alert and oriented x3.  She has mild left central VII that  tends fairly well over the left foot.  There is a bit slower with left  forward attention than into the right.  Strength in the left upper  extremity is 3+ to 4/5 proximal, 3+/5 distally at the hand.  She has  occasional catch in the thumb and index finger and flexion, particularly  when she is bearing down and trying to push herself with an activity.  Gait is notable for rightward elevation of the pelvis.  She is able to  correct herself somewhat with cuing.  Left leg is quite strong at 5/5.  She might have mildly decreased sensation in the left upper and  lower  extremity at 1+/2.  Cognitively, she is appropriate with good insight  and awareness.   ASSESSMENT:  1. Right middle cerebral artery infarct with associated right internal      carotid artery dissection.  2. Mild spastic left hemiparesis.  3. History of left inattention  4. History of migraine headaches.   PLAN:  1. I do not think the patient needs Botox at this point.  If anything,      we would weaken her flexors which are very functional at this      point.  I did recommend PIP/DIP splints for the first and second      fingers of the left hand.  She can use it at night and p.r.n.      during the day.  Continue physical therapy as tolerated for range      of motion.  We discussed integration of activities using the left      hand as much as possible.  She has really done quite a good job      overall.  We also discussed working on posture with gait.  She      might do well with a treadmill and a meter to help give her self      visual  cues with appropriate technique.  2. I will see her back in about 6 months' time.  If she has any      problems, she is welcome to call us or to see [place] here earlier.      Ranelle Oyster, M.D.  Electronically Signed     ZTS/MedQ  D:  07/24/2008 14:51:28  T:  07/25/2008 02:52:03  Job #:  [id number]

## 2010-08-11 NOTE — H&P (Signed)
NAME:  Ruth Gregory, Ruth Gregory NO.:  [id number]   MEDICAL RECORD NO.:  [id number]          PATIENT TYPE:  IPS   LOCATION:  4033                         FACILITY:  [facility]   PHYSICIAN:  Ranelle Oyster, M.D.DATE OF BIRTH:  03-30-1960   DATE OF ADMISSION:  09/04/2007  DATE OF DISCHARGE:                              HISTORY & PHYSICAL   CHIEF COMPLAINT:  Left-sided weakness after stroke.   HISTORY OF PRESENT ILLNESS:  This is a 50 year old white female with  history of migraine headaches who was admitted on August 29, 2007, with  left-sided weakness.  MRI was positive for right MCA infarct.  Angiogram  showed occluded right ICA with large clots.  The patient underwent  stenting and revascularization by Dr. Corliss Skains.  The patient had  received tPA as well.  She remain intubated for a period of time and  slow to wean.  The patient was also on aspirin and subcu Lovenox for  anticoagulation.  No aspirin or Coumadin ultimately were recommended due  to the fact that a small hemorrhagic lesion was identified on CT, post  stenting August 31, 2007.  The patient continues to have dysphagia and most  recent MBS cleared her for D1 nectar-thick liquid diet.  Depakote was  added for migraine headache treatment, as the headache had been  persistent during this stay.  Because of the above medical and physical  issues, the patient was admitted to the inpatient rehab unit today.   REVIEW OF SYSTEMS:  Notable for depression and migraine headaches.  Bowel and bladder functions have been fair.  Appetite has been  reasonable.  She has been drinking quite a bit.  Mood has been a bit  flat.  Otherwise, pertinent positives are as listed above.  Full review  is in the written H&P.   PAST MEDICAL HISTORY:  Positive for hypothyroidism, depression, breast  implants, and migraine headaches.  She occasionally drinks but does not  smoke.   FAMILY HISTORY:  Positive for CAD.   SOCIAL HISTORY:  The patient  is married and works as a Contractor at [employer].  She has 52 year old and 64 year old children.  Spouse  can assist upon discharge.  They have 1-level house with 2 steps to  enter.   FUNCTIONAL HISTORY:  The patient was independent prior to her arrival.  Currently, she is max total assist for transfers, mobility, and self-  care.   ALLERGIES:  None.   HOME MEDICATIONS:  Synthroid, Zomig, Lexapro, Xanax, birth control  pills, and oxycodone immediate release p.r.n. migraine headache.   LABORATORY DATA:  Hemoglobin 11.1, white count 7.4, and platelets 121.  Sodium 141, potassium 3.5, BUN and creatinine 5 and 0.7.   PHYSICAL EXAMINATION:  VITAL SIGNS:  Blood pressure is 120/80, pulse 80,  respiratory rate 18, and temperature 99.  GENERAL:  The patient is generally pleasant.  Mood is flat.  No acute  distress.  HEENT:  Pupils are equally round and reactive to light.  Ear, nose, and  throat exam is notable for fair to good dentition.  Oral mucosa is pink  and moist.  NECK:  Supple without JVD or lymphadenopathy.  CHEST:  Clear to auscultation bilaterally without wheezes, rales, or  rhonchi.  HEART:  Regular rate and rhythm without murmurs, rubs, or gallops.  ABDOMEN:  Soft, nontender.  Bowel sounds are positive.  SKIN:  Intact throughout.  No signs of breakdown noted.  NEUROLOGIC:  Cranial nerves II through XII revealed left central VII and  tongue deviation.  She had good visual tracking to all fields.  Reflexes  are 1+ in all 4 limbs.  Sensation was 1+/2 on the left side.  She had a  mild inattention to left.  Motor function is notable for trace to 1/5  movement to shoulder and biceps, 0 to trace distally.  Left lower  extremity is 1+ to 2- proximal to 1+ distally.  Judgment and orientation  were fair.  Memory and mood were appropriate although she was flat as  stated above.   ASSESSMENT AND PLAN:  1. Functional deficits secondary to right middle cerebral artery       stroke/right internal carotid artery dissection.  The patient is      with left hemiparesis, mild hemisensory loss, inattention as well      as dysphagia.  The patient is admitted to inpatient rehab unit here      to receive interdisciplinary medical care and rehab, which cannot      be provided at lesser intensity in service.  The patient will      receive 3 hours to 4 hours a day of therapy at least 5 days a week      to consist of physical therapy, occupational therapy, speech      language pathology, 24-hour rehab nursing, and appropriate case      management/social work involvement.  Physical therapy will assess      and treat the patient for range of motion, appropriate      transferring, gait, and equipment as needed.  Occupational therapy      will assess and treat for range of motion, strengthening/perceptual      training and appropriate equipment.  Speech language pathology will      assess and treat for significant dysphagia.  Rehab nurse will      follow on 24-hour basis for bowel, bladder, skin, medication      safety, and skin care issues.  Rehab case manager/social worker      will assess for psychosocial needs and discharge planning.      Estimated length of stay is 2 weeks.  Goals:  Supervision to      modified independent for ambulation and mobility and supervision to      minimal assist for basic self-care needs.  Prognosis is fair to      good.  2. Migraine headaches:  Continue Percocet and Depakote.  The patient      seems to have benefited from Depakote trial thus far.  Observe      impact of pain as it translates to her therapy, performance, etc.  3. Anticoagulation with subcutaneous Lovenox and aspirin.  Consider      followup CT imaging based on clinical course.  4. Hyperlipidemia, on Tricor.  5. Hypothyroid, continue Synthroid.      Ranelle Oyster, M.D.  Electronically Signed     ZTS/MEDQ  D:  09/04/2007  T:  09/05/2007  Job:  [id number]

## 2010-08-14 NOTE — Op Note (Signed)
NAME:  CORIANA, ANGELLO NO.:  [id number]   MEDICAL RECORD NO.:  [id number]          PATIENT TYPE:  OIB   LOCATION:  0098                         FACILITY:  [facility]   PHYSICIAN:  Thornton Park. Daphine Deutscher, MD  DATE OF BIRTH:  09/10/1960   DATE OF PROCEDURE:  DATE OF DISCHARGE:                                 OPERATIVE REPORT   PREOPERATIVE DIAGNOSIS:  Right thyroid nodule.   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   PROCEDURE:  Right thyroid lobectomy.   SURGEON:  Thornton Park. Daphine Deutscher, MD.   ASSISTANTWilmon Arms. Tsuei, MD.   ANESTHESIA:  General endotracheal.   DESCRIPTION OF PROCEDURE:  Morris Markham was taken to room 11 on May 05, 2005, given general anesthesia. The neck was prepped with and chlorhexidine  and draped sterilely. A transverse incision was made about two  fingerbreadth's from the sternal notch between the strap muscles favoring  the right side since this is where the mass was. After the platysma were  divided, I elevated the flaps underneath the platysma with scissors and then  put in the [place] retractor. I then cut beneath the strap muscles on the  right side, retracting the those laterally. I mobilized the right thyroid  lobe staying right on the lobe itself and teasing it away from the  surrounding tissue. I stayed right on the thyroid lobe and then went up to  the superior pole and divided those using ties and clips to ligate the  vascular supply of the thyroid right superior pole. I then was able to  deliver the thyroid into the wound. I then stayed right on the capsule  leaving the right parathyroid glands and the inferior thyroid artery taking  the branches as they went up onto the gland. I was able to get through this  and completely mobilize this. I stayed away from recurrent nerve which was  undisturbed. The specimen was sent along with a little nodule from the  thyroid isthmus. These were sent  for frozen section which returned a  diagnosis by Dr. Jonny Ruiz __________ of benign papillary neoplasm. I then  irrigated. No bleeding was noted. I did put a piece of Surgicel in that area  on the right side and closed the midline with 4-0 Vicryl's. The wound was  then closed in layers with 4-0 Vicryl in the platysma and a running  subcuticular 5-0 Vicryl with Benzoin and Steri-Strips on the skin. The  patient tolerated the procedure well and was taken to the recovery room in  satisfactory condition.      Thornton Park Daphine Deutscher, MD  Electronically Signed    MBM/MEDQ  D:  05/05/2005  T:  05/06/2005  Job:  [id number]   cc:   Lucky Cowboy, M.D.  Fax: [phone]

## 2010-11-18 ENCOUNTER — Other Ambulatory Visit (HOSPITAL_COMMUNITY): Payer: Self-pay | Admitting: Internal Medicine

## 2010-11-18 DIAGNOSIS — Z1231 Encounter for screening mammogram for malignant neoplasm of breast: Secondary | ICD-10-CM

## 2010-12-03 ENCOUNTER — Ambulatory Visit (HOSPITAL_COMMUNITY)
Admission: RE | Admit: 2010-12-03 | Discharge: 2010-12-03 | Disposition: A | Discharge status: Home / Self Care | Payer: BC Managed Care – PPO | Source: Ambulatory Visit | Attending: Internal Medicine | Admitting: Internal Medicine

## 2010-12-03 DIAGNOSIS — Z1231 Encounter for screening mammogram for malignant neoplasm of breast: Secondary | ICD-10-CM | POA: Insufficient documentation

## 2010-12-24 LAB — COMPREHENSIVE METABOLIC PANEL
ALT: 15
ALT: 20
AST: 25
AST: 26
Albumin: 3.2 — ABNORMAL LOW
Albumin: 3.5
Alkaline Phosphatase: 33 — ABNORMAL LOW
Alkaline Phosphatase: 39
BUN: 6
BUN: 8
CO2: 21
CO2: 25
Calcium: 8.4
Calcium: 9.6
Chloride: 104
Chloride: 108
Creatinine, Ser: 0.82
Creatinine, Ser: 0.86
GFR calc Af Amer: >60
GFR calc Af Amer: >60
GFR calc non Af Amer: >60
GFR calc non Af Amer: >60
Glucose, Bld: 109 — ABNORMAL HIGH
Glucose, Bld: 148 — ABNORMAL HIGH
Potassium: 3.8
Potassium: 4.3
Sodium: 140
Sodium: 141
Total Bilirubin: 0.7
Total Bilirubin: 0.9
Total Protein: 5.5 — ABNORMAL LOW
Total Protein: 6.4

## 2010-12-24 LAB — CBC
HCT: 31.1 — ABNORMAL LOW
HCT: 32 — ABNORMAL LOW
HCT: 35.7 — ABNORMAL LOW
HCT: 36.5
HCT: 40
HCT: 40.3
HCT: 35.8 — ABNORMAL LOW
Hemoglobin: 11.1 — ABNORMAL LOW
Hemoglobin: 11.4 — ABNORMAL LOW
Hemoglobin: 12.8
Hemoglobin: 13
Hemoglobin: 14
Hemoglobin: 14.1
Hemoglobin: 12.8
MCHC: 34.9
MCHC: 35
MCHC: 35.5
MCHC: 35.7
MCHC: 35.7
MCHC: 35.8
MCHC: 35.8
MCV: 93.7
MCV: 93.8
MCV: 94.7
MCV: 94.8
MCV: 95
MCV: 95.3
MCV: 94.5
Platelets: 121 — ABNORMAL LOW
Platelets: 126 — ABNORMAL LOW
Platelets: 141 — ABNORMAL LOW
Platelets: 210
Platelets: 243
Platelets: 98 — ABNORMAL LOW
Platelets: 191
RBC: 3.32 — ABNORMAL LOW
RBC: 3.42 — ABNORMAL LOW
RBC: 3.76 — ABNORMAL LOW
RBC: 3.85 — ABNORMAL LOW
RBC: 4.22
RBC: 4.24
RBC: 3.79 — ABNORMAL LOW
RDW: 11.7
RDW: 11.7
RDW: 12
RDW: 12.1
RDW: 12.1
RDW: 12.1
RDW: 12.3
WBC: 5.4
WBC: 5.6
WBC: 7.4
WBC: 8.4
WBC: 8.5
WBC: 8.6
WBC: 3.5 — ABNORMAL LOW

## 2010-12-24 LAB — BASIC METABOLIC PANEL
BUN: 5 — ABNORMAL LOW
BUN: 5 — ABNORMAL LOW
BUN: 7
BUN: 13
CO2: 24
CO2: 24
CO2: 25
CO2: 29
Calcium: 7.7 — ABNORMAL LOW
Calcium: 8 — ABNORMAL LOW
Calcium: 8.2 — ABNORMAL LOW
Calcium: 9.1
Chloride: 105
Chloride: 114 — ABNORMAL HIGH
Chloride: 116 — ABNORMAL HIGH
Chloride: 105
Creatinine, Ser: 0.75
Creatinine, Ser: 0.82
Creatinine, Ser: 0.88
Creatinine, Ser: 0.92
GFR calc Af Amer: >60
GFR calc Af Amer: >60
GFR calc Af Amer: >60
GFR calc Af Amer: >60
GFR calc non Af Amer: >60
GFR calc non Af Amer: >60
GFR calc non Af Amer: >60
GFR calc non Af Amer: >60
Glucose, Bld: 106 — ABNORMAL HIGH
Glucose, Bld: 107 — ABNORMAL HIGH
Glucose, Bld: 112 — ABNORMAL HIGH
Glucose, Bld: 111 — ABNORMAL HIGH
Potassium: 3.1 — ABNORMAL LOW
Potassium: 3.3 — ABNORMAL LOW
Potassium: 3.5
Potassium: 4
Sodium: 141
Sodium: 144
Sodium: 144
Sodium: 139

## 2010-12-24 LAB — CULTURE, BLOOD (ROUTINE X 2)
Culture: NO GROWTH
Culture: NO GROWTH

## 2010-12-24 LAB — DIFFERENTIAL
Basophils Absolute: 0
Basophils Absolute: 0
Basophils Relative: 0
Basophils Relative: 0
Eosinophils Absolute: 0
Eosinophils Absolute: 0.2
Eosinophils Relative: 0
Eosinophils Relative: 3
Lymphocytes Relative: 10 — ABNORMAL LOW
Lymphocytes Relative: 21
Lymphs Abs: 0.9
Lymphs Abs: 1.2
Monocytes Absolute: 0.3
Monocytes Absolute: 0.5
Monocytes Relative: 4
Monocytes Relative: 8
Neutro Abs: 3.8
Neutro Abs: 7.2
Neutrophils Relative %: 67
Neutrophils Relative %: 86 — ABNORMAL HIGH

## 2010-12-24 LAB — BLOOD GAS, ARTERIAL
Acid-base deficit: 0.1
Acid-base deficit: 4 — ABNORMAL HIGH
Bicarbonate: 18.7 — ABNORMAL LOW
Bicarbonate: 23.3
Drawn by: 296031
FIO2: 0.3
FIO2: 0.5
MECHVT: 500
MECHVT: 600
O2 Saturation: 97.8
O2 Saturation: 99.8
PEEP: 5
PEEP: 5
Patient temperature: 98.1
Patient temperature: 98.6
RATE: 16
RATE: 16
TCO2: 19.4
TCO2: 24.4
pCO2 arterial: 23.6 — ABNORMAL LOW
pCO2 arterial: 33.9 — ABNORMAL LOW
pH, Arterial: 7.453 — ABNORMAL HIGH
pH, Arterial: 7.509 — ABNORMAL HIGH
pO2, Arterial: 195 — ABNORMAL HIGH
pO2, Arterial: 90.9

## 2010-12-24 LAB — PHOSPHORUS
Phosphorus: 1.8 — ABNORMAL LOW
Phosphorus: 2.3

## 2010-12-24 LAB — LIPID PANEL
Cholesterol: 158
HDL: 26 — ABNORMAL LOW
LDL Cholesterol: UNDETERMINED
Total CHOL/HDL Ratio: 6.1
Triglycerides: 572 — ABNORMAL HIGH
VLDL: UNDETERMINED

## 2010-12-24 LAB — HEMOGLOBIN A1C
Hgb A1c MFr Bld: 5.3
Mean Plasma Glucose: 111

## 2010-12-24 LAB — MAGNESIUM
Magnesium: 2.1
Magnesium: 2.2

## 2010-12-24 LAB — URINALYSIS, ROUTINE W REFLEX MICROSCOPIC
Bilirubin Urine: NEGATIVE
Glucose, UA: NEGATIVE
Hgb urine dipstick: NEGATIVE
Ketones, ur: NEGATIVE
Nitrite: NEGATIVE
Protein, ur: NEGATIVE
Specific Gravity, Urine: 1.017
Urobilinogen, UA: 0.2
pH: 5.5

## 2010-12-24 LAB — POCT I-STAT, CHEM 8
BUN: 13
Calcium, Ion: 1.16
Chloride: 106
Creatinine, Ser: 1
Glucose, Bld: 130 — ABNORMAL HIGH
HCT: 41
Hemoglobin: 13.9
Potassium: 3.9
Sodium: 139
TCO2: 22

## 2010-12-24 LAB — PROTIME-INR
INR: 1
Prothrombin Time: 13.8

## 2010-12-24 LAB — CULTURE, BAL-QUANTITATIVE W GRAM STAIN
Colony Count: NO GROWTH
Culture: NO GROWTH
Gram Stain: NONE SEEN

## 2010-12-24 LAB — HEPARIN LEVEL (UNFRACTIONATED): Heparin Unfractionated: <0.1 — ABNORMAL LOW

## 2010-12-24 LAB — HOMOCYSTEINE: Homocysteine: 8.8

## 2010-12-24 LAB — APTT: aPTT: 31

## 2011-11-15 ENCOUNTER — Other Ambulatory Visit (HOSPITAL_COMMUNITY): Payer: Self-pay | Admitting: Internal Medicine

## 2011-11-15 DIAGNOSIS — Z1231 Encounter for screening mammogram for malignant neoplasm of breast: Secondary | ICD-10-CM

## 2011-11-15 DIAGNOSIS — Z1239 Encounter for other screening for malignant neoplasm of breast: Secondary | ICD-10-CM

## 2011-12-02 ENCOUNTER — Other Ambulatory Visit: Payer: Self-pay | Admitting: Internal Medicine

## 2011-12-02 DIAGNOSIS — R1011 Right upper quadrant pain: Secondary | ICD-10-CM

## 2011-12-02 DIAGNOSIS — R110 Nausea: Secondary | ICD-10-CM

## 2011-12-07 ENCOUNTER — Ambulatory Visit
Admission: RE | Admit: 2011-12-07 | Discharge: 2011-12-07 | Disposition: A | Discharge status: Home / Self Care | Payer: BC Managed Care – PPO | Source: Ambulatory Visit | Attending: Internal Medicine | Admitting: Internal Medicine

## 2011-12-07 DIAGNOSIS — R1011 Right upper quadrant pain: Secondary | ICD-10-CM

## 2011-12-07 DIAGNOSIS — R110 Nausea: Secondary | ICD-10-CM

## 2011-12-21 ENCOUNTER — Ambulatory Visit (HOSPITAL_COMMUNITY)
Admission: RE | Admit: 2011-12-21 | Discharge: 2011-12-21 | Disposition: A | Discharge status: Home / Self Care | Payer: BC Managed Care – PPO | Source: Ambulatory Visit | Attending: Internal Medicine | Admitting: Internal Medicine

## 2011-12-21 DIAGNOSIS — Z1231 Encounter for screening mammogram for malignant neoplasm of breast: Secondary | ICD-10-CM | POA: Insufficient documentation

## 2012-09-05 ENCOUNTER — Encounter: Payer: Self-pay | Admitting: Neurology

## 2012-09-05 DIAGNOSIS — I63239 Cerebral infarction due to unspecified occlusion or stenosis of unspecified carotid arteries: Secondary | ICD-10-CM

## 2012-09-05 DIAGNOSIS — M62838 Other muscle spasm: Secondary | ICD-10-CM

## 2012-09-05 DIAGNOSIS — Z8673 Personal history of transient ischemic attack (TIA), and cerebral infarction without residual deficits: Secondary | ICD-10-CM | POA: Insufficient documentation

## 2012-09-06 ENCOUNTER — Ambulatory Visit (INDEPENDENT_AMBULATORY_CARE_PROVIDER_SITE_OTHER): Payer: BC Managed Care – PPO | Admitting: Neurology

## 2012-09-06 ENCOUNTER — Encounter: Payer: Self-pay | Admitting: Neurology

## 2012-09-06 VITALS — BP 124/86 | HR 94 | Ht 70.0 in | Wt 152.0 lb

## 2012-09-06 DIAGNOSIS — G8110 Spastic hemiplegia affecting unspecified side: Secondary | ICD-10-CM

## 2012-09-06 DIAGNOSIS — G8114 Spastic hemiplegia affecting left nondominant side: Secondary | ICD-10-CM | POA: Insufficient documentation

## 2012-09-06 DIAGNOSIS — G43909 Migraine, unspecified, not intractable, without status migrainosus: Secondary | ICD-10-CM | POA: Insufficient documentation

## 2012-09-06 DIAGNOSIS — I639 Cerebral infarction, unspecified: Secondary | ICD-10-CM

## 2012-09-06 DIAGNOSIS — IMO0002 Reserved for concepts with insufficient information to code with codable children: Secondary | ICD-10-CM | POA: Insufficient documentation

## 2012-09-06 DIAGNOSIS — G43709 Chronic migraine without aura, not intractable, without status migrainosus: Secondary | ICD-10-CM | POA: Insufficient documentation

## 2012-09-06 MED ORDER — ONABOTULINUMTOXINA 100 UNITS IJ SOLR
300.0000 [IU] | Freq: Once | INTRAMUSCULAR | Status: AC
  Administered 2012-09-06: 300 [IU] via INTRAMUSCULAR

## 2012-09-06 MED ORDER — DICLOFENAC POTASSIUM(MIGRAINE) 50 MG PO PACK: 50.0000 mg | PACK | Freq: Every day | ORAL | Status: DC

## 2012-09-06 NOTE — Progress Notes (Signed)
History of Present Illness: [name] is here for EMG guided BOTOX injection.  She has history of right internal carotid artery dissection following an episode of vomiting from migraine headache in mid June 2009. She was given IV TPA and found to have right MCA occlusion. She underwent emergent right carotid artery stent with distal endovascular recanalization of the middle cerebral artery.  She had small hemorrhagic transformation of the right basal ganglia with mild left hemiparesis, but has done well since then and has been independent.  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  She has been receiving BOTOX injection since 01/2009 q3 months, to her left upper extremity and also left lower extremity, responded well.  Repeat US carotid were normal in October 2013  UPDATE June 11th 2014: Last EMG guided injection was in March 2014, she responded very well, but complains of  left hand weakness, no pain at his left ankle, shoulder, elbow, or wrist,  She also complains of increased a migraine headache over the past few months, about couple times a week, she been taking prednisone as needed for abortive treatment, in addition to Ultram, she is taking topiramate for preventive medications, Physical Exam  General: Pleasant Caucasian lady , in no distress.  Afebrile.   Head: nontraumatic Musculoskeletal: no deformity Skin: no rash, petechiae  Neurologic Exam  Mental Status: pleasant, awake,  alert, cooperative to history, talking, and casual conversation. Cranial Nerves: CN II-XII pupils were equal round reactive to light.   Extraocular movements were full.  Visual fields were full on confrontational test.  slight shallow left nasolabial fold.  Hearing was intact to finger rubbing bilaterally.  Uvula tongue were midline.  Head turning and shoulder shrugging were normal and symmetric.  Tongue protrusion into the cheeks strength were normal.  Motor: mild spastic hemiparesis of left arm, tends to stay at left elbow flexion, pronation, mild left shoulder droop, decreased arm swing when ambulating. Mild left finger flexion, weak grip 4, left thumb tends to stay in flexed position.  Left ankle slight limited ROM, tends to stay in left ankle plantar inversion. Sensory: Normal to light touch, pinprick, proprioception, and vibratory sensation. Coordination: There was no dysmetria noticed. Gait and Station: Narrow based and steady, mild left ankle plantar inversion, decreased left arm swing, slight left shoulder droop Reflexes: Deep tendon reflexes are 2+, hyper reflexia on left side  Plantars are downgoing.     Assessment and Plan:   Spastic left hemiparesis, due to right MCA stroke in June 2009, right internal carotid artery dissection, s/p right IC stent.   under EMG guidance 300units of botox (Lot No.  [id number] exp Dec 2016), 100 units dissolved in 2cc of NS.  Left brachialis 50 Left biceps 25   Left flexor digitorum profundi 12.5 Left pronator 12.5 Left flexor digitorum superficialis 12.5 Left palmaris longus 12. 5  Left Levator scapular 25 Left teres major 25 Left pectoralis major 25  Left tibialis posterior 50 units left medial gastrocnemius 50 units   She will  return in 3 months for repeat injection, She also complains of frequent migraine headaches, I have suggested cambian as needed. Keep current preventive medication topiramate, and also suggested magnesium oxide, and  riboflavin

## 2012-09-06 NOTE — Addendum Note (Signed)
Addended by: Levert Feinstein on: 09/06/2012 05:38 PM   Modules accepted: Orders

## 2012-09-11 ENCOUNTER — Other Ambulatory Visit: Payer: Self-pay

## 2012-09-11 MED ORDER — DICLOFENAC POTASSIUM(MIGRAINE) 50 MG PO PACK: 50.0000 mg | PACK | Freq: Every day | ORAL | Status: DC

## 2012-12-13 ENCOUNTER — Encounter: Payer: Self-pay | Admitting: Neurology

## 2012-12-13 ENCOUNTER — Ambulatory Visit (INDEPENDENT_AMBULATORY_CARE_PROVIDER_SITE_OTHER): Payer: BC Managed Care – PPO | Admitting: Neurology

## 2012-12-13 VITALS — BP 129/89 | HR 93 | Ht 68.0 in | Wt 152.0 lb

## 2012-12-13 DIAGNOSIS — M62838 Other muscle spasm: Secondary | ICD-10-CM

## 2012-12-13 DIAGNOSIS — G8114 Spastic hemiplegia affecting left nondominant side: Secondary | ICD-10-CM

## 2012-12-13 DIAGNOSIS — I6350 Cerebral infarction due to unspecified occlusion or stenosis of unspecified cerebral artery: Secondary | ICD-10-CM

## 2012-12-13 DIAGNOSIS — G43909 Migraine, unspecified, not intractable, without status migrainosus: Secondary | ICD-10-CM

## 2012-12-13 DIAGNOSIS — G8110 Spastic hemiplegia affecting unspecified side: Secondary | ICD-10-CM

## 2012-12-13 DIAGNOSIS — I639 Cerebral infarction, unspecified: Secondary | ICD-10-CM

## 2012-12-13 DIAGNOSIS — I63239 Cerebral infarction due to unspecified occlusion or stenosis of unspecified carotid arteries: Secondary | ICD-10-CM

## 2012-12-13 MED ORDER — ONABOTULINUMTOXINA 100 UNITS IJ SOLR
300.0000 [IU] | Freq: Once | INTRAMUSCULAR | Status: AC
  Administered 2012-12-13: 300 [IU] via INTRAMUSCULAR

## 2012-12-13 NOTE — Progress Notes (Signed)
  History of Present Illness: Ruth Gregory is here for EMG guided BOTOX injection.  She has history of right internal carotid artery dissection following an episode of vomiting from migraine headache in mid June 2009. She was given IV TPA and found to have right MCA occlusion. She underwent emergent right carotid artery stent with distal endovascular recanalization of the middle cerebral artery.  She had small hemorrhagic transformation of the right basal ganglia with mild left hemiparesis, but has done well since then and has been independent.  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  She has been receiving BOTOX injection since 01/2009 q3 months, to her left upper extremity and also left lower extremity, responded well.  Repeat US carotid were normal in October 2013  UPDATE September seventeenth 2014: Last EMG guided injection was in June 2014, she responded very well, especially her left leg, her mother passed away in 2012-11-21, she is dealing with the grieving. There is no significant left hand weakness, no left shoulder pain  Physical Exam  General: Pleasant Caucasian lady , in no distress.  Afebrile.   Head: nontraumatic Musculoskeletal: no deformity Skin: no rash, petechiae  Neurologic Exam  Mental Status: pleasant, awake, alert, cooperative to history, talking, and casual conversation. Cranial Nerves: CN II-XII pupils were equal round reactive to light.   Extraocular movements  were full.  Visual fields were full on confrontational test.  slight shallow left nasolabial fold.  Hearing was intact to finger rubbing bilaterally.  Uvula tongue were midline.  Head turning and shoulder shrugging were normal and symmetric.  Tongue protrusion into the cheeks strength were normal.  Motor: mild spastic hemiparesis of left arm, tends to stay at left elbow flexion, pronation, mild left shoulder droop, decreased arm swing when ambulating. Mild left finger flexion, weak grip 4, left thumb tends to stay in flexed position.  Left ankle slight limited ROM, tends to stay in left ankle plantar inversion. Sensory: Normal to light touch, pinprick, proprioception, and vibratory sensation. Coordination: There was no dysmetria noticed. Gait and Station: Narrow based and steady, mild left ankle plantar inversion, decreased left arm swing, slight left shoulder droop Reflexes: Deep tendon reflexes are 2+, hyper reflexia on left side  Plantars are downgoing.     Assessment and Plan:  Spastic left hemiparesis, due to right MCA stroke in June 2009, right internal carotid artery dissection, s/p right IC stent.   under EMG guidance 300units of botox (Lot No.  [id number]  exp Feb 2017), 100 units dissolved in 2cc of NS.  Left brachialis 50 Left biceps 25   Left flexor digitorum profundi 25 Left pronator 25 Left flexor digitorum superficialis 12.5 Left palmaris longus 12.5     Left flexor digitorum longus 25 units Left tibialis posterior 75 units left medial gastrocnemius 50 units   She will return in 3 months for repeat injection,

## 2012-12-22 ENCOUNTER — Encounter (HOSPITAL_COMMUNITY): Payer: Self-pay | Admitting: Pharmacist

## 2012-12-22 NOTE — Progress Notes (Signed)
Heather called from Dr [facility] office.  Per Dr Marcelle Overlie, patient needs to see anesthesia during PAT appt.

## 2012-12-22 NOTE — Progress Notes (Signed)
Patient has history of stroke and stents in carotid artery per Dr Marcelle Overlie.

## 2012-12-26 ENCOUNTER — Other Ambulatory Visit (HOSPITAL_COMMUNITY): Payer: Self-pay | Admitting: Internal Medicine

## 2012-12-26 DIAGNOSIS — Z1231 Encounter for screening mammogram for malignant neoplasm of breast: Secondary | ICD-10-CM

## 2013-01-01 ENCOUNTER — Encounter (HOSPITAL_COMMUNITY): Payer: Self-pay

## 2013-01-01 ENCOUNTER — Encounter (HOSPITAL_COMMUNITY)
Admission: RE | Admit: 2013-01-01 | Discharge: 2013-01-01 | Disposition: A | Discharge status: Home / Self Care | Payer: BC Managed Care – PPO | Source: Ambulatory Visit | Attending: Obstetrics and Gynecology | Admitting: Obstetrics and Gynecology

## 2013-01-01 HISTORY — DX: Unspecified asthma, uncomplicated: J45.909

## 2013-01-01 HISTORY — DX: Headache: R51

## 2013-01-01 HISTORY — DX: Pure hypercholesterolemia, unspecified: E78.00

## 2013-01-01 HISTORY — DX: Hypothyroidism, unspecified: E03.9

## 2013-01-01 HISTORY — DX: Heartburn: R12

## 2013-01-01 HISTORY — DX: Depression, unspecified: F32.A

## 2013-01-01 HISTORY — DX: Major depressive disorder, single episode, unspecified: F32.9

## 2013-01-01 LAB — CBC
HCT: 40.5 % (ref 36.0–46.0)
Hemoglobin: 13.8 g/dL (ref 12.0–15.0)
MCH: 30.7 pg (ref 26.0–34.0)
MCHC: 34.1 g/dL (ref 30.0–36.0)
MCV: 90.2 fL (ref 78.0–100.0)
Platelets: 204 10*3/uL (ref 150–400)
RBC: 4.49 MIL/uL (ref 3.87–5.11)
RDW: 12.4 % (ref 11.5–15.5)
WBC: 5.6 10*3/uL (ref 4.0–10.5)

## 2013-01-01 NOTE — Patient Instructions (Addendum)
Your procedure is scheduled on: 01/08/13  Enter through the Main Entrance at :6am Pick up desk phone and dial [phone] and inform us of your arrival.  Please call [phone] if you have any problems the morning of surgery.  Remember: Do not eat food or drink liquids, including water, after midnight:SUNDAY   You may brush your teeth the morning of surgery.  Take these meds the morning of surgery with a sip of water:Synthroid- bring inhaler to hospital  DO NOT wear jewelry, eye make-up, lipstick,body lotion, or dark fingernail polish.  (Polished toes are ok) You may wear deodorant.  If you are to be admitted after surgery, leave suitcase in car until your room has been assigned. Patients discharged on the day of surgery will not be allowed to drive home. Wear loose fitting, comfortable clothes for your ride home.

## 2013-01-01 NOTE — Pre-Procedure Instructions (Signed)
Dr. Sherron Ales made aware of pt's medical history, ie stroke.

## 2013-01-01 NOTE — H&P (Signed)
Ruth Gregory  DICTATION # [id number] CSN# [id number]   Meriel Pica, MD 01/01/2013 1:47 PM

## 2013-01-03 ENCOUNTER — Ambulatory Visit (HOSPITAL_COMMUNITY)
Admission: RE | Admit: 2013-01-03 | Discharge: 2013-01-03 | Disposition: A | Discharge status: Home / Self Care | Payer: BC Managed Care – PPO | Source: Ambulatory Visit | Attending: Internal Medicine | Admitting: Internal Medicine

## 2013-01-03 DIAGNOSIS — Z01812 Encounter for preprocedural laboratory examination: Secondary | ICD-10-CM | POA: Insufficient documentation

## 2013-01-03 DIAGNOSIS — Z1231 Encounter for screening mammogram for malignant neoplasm of breast: Secondary | ICD-10-CM

## 2013-01-07 MED ORDER — GENTAMICIN SULFATE 40 MG/ML IJ SOLN
INTRAVENOUS | Status: AC
  Administered 2013-01-08: 100 mL via INTRAVENOUS
  Filled 2013-01-07: qty 8.75

## 2013-01-08 ENCOUNTER — Encounter (HOSPITAL_COMMUNITY): Payer: Self-pay | Admitting: Anesthesiology

## 2013-01-08 ENCOUNTER — Encounter (HOSPITAL_COMMUNITY): Payer: BC Managed Care – PPO | Admitting: Anesthesiology

## 2013-01-08 ENCOUNTER — Encounter (HOSPITAL_COMMUNITY)
Admission: RE | Disposition: A | Discharge status: Home / Self Care | Payer: Self-pay | Source: Ambulatory Visit | Attending: Obstetrics and Gynecology

## 2013-01-08 ENCOUNTER — Observation Stay (HOSPITAL_COMMUNITY)
Admission: RE | Admit: 2013-01-08 | Discharge: 2013-01-08 | Disposition: A | Discharge status: Home / Self Care | Payer: BC Managed Care – PPO | Source: Ambulatory Visit | Attending: Obstetrics and Gynecology | Admitting: Obstetrics and Gynecology

## 2013-01-08 ENCOUNTER — Ambulatory Visit (HOSPITAL_COMMUNITY): Payer: BC Managed Care – PPO | Admitting: Anesthesiology

## 2013-01-08 DIAGNOSIS — E039 Hypothyroidism, unspecified: Secondary | ICD-10-CM | POA: Insufficient documentation

## 2013-01-08 DIAGNOSIS — Z8673 Personal history of transient ischemic attack (TIA), and cerebral infarction without residual deficits: Secondary | ICD-10-CM | POA: Insufficient documentation

## 2013-01-08 DIAGNOSIS — N993 Prolapse of vaginal vault after hysterectomy: Principal | ICD-10-CM | POA: Insufficient documentation

## 2013-01-08 HISTORY — PX: CYSTOCELE REPAIR: SHX163

## 2013-01-08 SURGERY — COLPORRHAPHY, ANTERIOR, FOR CYSTOCELE REPAIR
Anesthesia: General | Site: Vagina | Wound class: Clean Contaminated

## 2013-01-08 MED ORDER — ESTRADIOL 0.1 MG/GM VA CREA
TOPICAL_CREAM | VAGINAL | Status: AC
  Filled 2013-01-08: qty 42.5

## 2013-01-08 MED ORDER — BUTORPHANOL TARTRATE 1 MG/ML IJ SOLN: 1.0000 mg | INTRAMUSCULAR | Status: DC | PRN

## 2013-01-08 MED ORDER — MIDAZOLAM HCL 2 MG/2ML IJ SOLN
INTRAMUSCULAR | Status: AC
  Filled 2013-01-08: qty 2

## 2013-01-08 MED ORDER — FAMOTIDINE 20 MG PO TABS: 20.0000 mg | ORAL_TABLET | Freq: Two times a day (BID) | ORAL | Status: DC

## 2013-01-08 MED ORDER — LEVOTHYROXINE SODIUM 100 MCG PO TABS
100.0000 ug | ORAL_TABLET | Freq: Every day | ORAL | Status: DC
  Filled 2013-01-08: qty 1

## 2013-01-08 MED ORDER — LORATADINE 10 MG PO TABS
10.0000 mg | ORAL_TABLET | Freq: Every day | ORAL | Status: DC
  Filled 2013-01-08 (×2): qty 1

## 2013-01-08 MED ORDER — ZOLPIDEM TARTRATE 5 MG PO TABS: 5.0000 mg | ORAL_TABLET | Freq: Every evening | ORAL | Status: DC | PRN

## 2013-01-08 MED ORDER — FENOFIBRATE 160 MG PO TABS
160.0000 mg | ORAL_TABLET | Freq: Every day | ORAL | Status: DC
  Filled 2013-01-08 (×2): qty 1

## 2013-01-08 MED ORDER — DEXAMETHASONE SODIUM PHOSPHATE 10 MG/ML IJ SOLN
INTRAMUSCULAR | Status: AC
  Filled 2013-01-08: qty 1

## 2013-01-08 MED ORDER — FENTANYL CITRATE 0.05 MG/ML IJ SOLN: 25.0000 ug | INTRAMUSCULAR | Status: DC | PRN

## 2013-01-08 MED ORDER — LACTATED RINGERS IV SOLN
INTRAVENOUS | Status: DC
  Administered 2013-01-08: 08:00:00 via INTRAVENOUS

## 2013-01-08 MED ORDER — LIDOCAINE-EPINEPHRINE 1 %-1:100000 IJ SOLN
INTRAMUSCULAR | Status: AC
  Filled 2013-01-08: qty 1

## 2013-01-08 MED ORDER — PROPOFOL 10 MG/ML IV BOLUS
INTRAVENOUS | Status: DC | PRN
  Administered 2013-01-08: 150 mg via INTRAVENOUS
  Administered 2013-01-08: 50 mg via INTRAVENOUS

## 2013-01-08 MED ORDER — ALPRAZOLAM 0.5 MG PO TABS: 0.5000 mg | ORAL_TABLET | Freq: Every day | ORAL | Status: DC | PRN

## 2013-01-08 MED ORDER — SODIUM CHLORIDE 0.9 % IJ SOLN
INTRAMUSCULAR | Status: AC
  Filled 2013-01-08: qty 50

## 2013-01-08 MED ORDER — LIDOCAINE HCL (CARDIAC) 20 MG/ML IV SOLN
INTRAVENOUS | Status: DC | PRN
  Administered 2013-01-08: 50 mg via INTRAVENOUS

## 2013-01-08 MED ORDER — ESTRADIOL 0.1 MG/GM VA CREA
TOPICAL_CREAM | VAGINAL | Status: DC | PRN
  Administered 2013-01-08: 1 via VAGINAL

## 2013-01-08 MED ORDER — PHENYLEPHRINE HCL 10 MG/ML IJ SOLN
10.0000 mg | INTRAVENOUS | Status: DC | PRN
  Administered 2013-01-08: 20 ug/min via INTRAVENOUS

## 2013-01-08 MED ORDER — MEPERIDINE HCL 25 MG/ML IJ SOLN: 6.2500 mg | INTRAMUSCULAR | Status: DC | PRN

## 2013-01-08 MED ORDER — ONDANSETRON HCL 4 MG/2ML IJ SOLN
INTRAMUSCULAR | Status: AC
  Filled 2013-01-08: qty 2

## 2013-01-08 MED ORDER — FENTANYL CITRATE 0.05 MG/ML IJ SOLN
INTRAMUSCULAR | Status: DC | PRN
  Administered 2013-01-08: 100 ug via INTRAVENOUS

## 2013-01-08 MED ORDER — OXYCODONE-ACETAMINOPHEN 5-325 MG PO TABS
1.0000 | ORAL_TABLET | ORAL | Status: DC | PRN
  Administered 2013-01-08 (×2): 1 via ORAL
  Filled 2013-01-08 (×2): qty 1

## 2013-01-08 MED ORDER — IBUPROFEN 800 MG PO TABS
800.0000 mg | ORAL_TABLET | Freq: Three times a day (TID) | ORAL | Status: DC | PRN
  Administered 2013-01-08: 800 mg via ORAL
  Filled 2013-01-08: qty 1

## 2013-01-08 MED ORDER — MENTHOL 3 MG MT LOZG: 1.0000 | LOZENGE | OROMUCOSAL | Status: DC | PRN

## 2013-01-08 MED ORDER — ASPIRIN EC 325 MG PO TBEC
325.0000 mg | DELAYED_RELEASE_TABLET | Freq: Every day | ORAL | Status: DC
  Filled 2013-01-08 (×2): qty 1

## 2013-01-08 MED ORDER — SCOPOLAMINE 1 MG/3DAYS TD PT72
MEDICATED_PATCH | TRANSDERMAL | Status: AC
  Administered 2013-01-08: 1.5 mg via TRANSDERMAL
  Filled 2013-01-08: qty 1

## 2013-01-08 MED ORDER — PROPOFOL 10 MG/ML IV EMUL
INTRAVENOUS | Status: AC
  Filled 2013-01-08: qty 20

## 2013-01-08 MED ORDER — LIDOCAINE-EPINEPHRINE 1 %-1:100000 IJ SOLN
INTRAMUSCULAR | Status: DC | PRN
  Administered 2013-01-08: 8 mL

## 2013-01-08 MED ORDER — ALBUTEROL SULFATE HFA 108 (90 BASE) MCG/ACT IN AERS: 2.0000 | INHALATION_SPRAY | Freq: Four times a day (QID) | RESPIRATORY_TRACT | Status: DC | PRN

## 2013-01-08 MED ORDER — ONDANSETRON HCL 4 MG/2ML IJ SOLN
INTRAMUSCULAR | Status: DC | PRN
  Administered 2013-01-08: 4 mg via INTRAMUSCULAR

## 2013-01-08 MED ORDER — KETOROLAC TROMETHAMINE 30 MG/ML IJ SOLN: 15.0000 mg | Freq: Once | INTRAMUSCULAR | Status: DC | PRN

## 2013-01-08 MED ORDER — SCOPOLAMINE 1 MG/3DAYS TD PT72
1.0000 | MEDICATED_PATCH | TRANSDERMAL | Status: DC
  Administered 2013-01-08: 1.5 mg via TRANSDERMAL

## 2013-01-08 MED ORDER — TOPIRAMATE 25 MG PO TABS
150.0000 mg | ORAL_TABLET | Freq: Every day | ORAL | Status: DC
  Filled 2013-01-08 (×2): qty 2

## 2013-01-08 MED ORDER — LIDOCAINE HCL (CARDIAC) 20 MG/ML IV SOLN
INTRAVENOUS | Status: AC
  Filled 2013-01-08: qty 5

## 2013-01-08 MED ORDER — BUPROPION HCL ER (XL) 300 MG PO TB24
300.0000 mg | ORAL_TABLET | Freq: Every day | ORAL | Status: DC
  Filled 2013-01-08 (×2): qty 1

## 2013-01-08 MED ORDER — SODIUM CHLORIDE 0.9 % IJ SOLN
INTRAMUSCULAR | Status: DC | PRN
  Administered 2013-01-08: 50 mL

## 2013-01-08 MED ORDER — MIDAZOLAM HCL 2 MG/2ML IJ SOLN
INTRAMUSCULAR | Status: DC | PRN
  Administered 2013-01-08: 1 mg via INTRAVENOUS

## 2013-01-08 MED ORDER — ONDANSETRON HCL 4 MG PO TABS: 4.0000 mg | ORAL_TABLET | Freq: Four times a day (QID) | ORAL | Status: DC | PRN

## 2013-01-08 MED ORDER — DEXTROSE IN LACTATED RINGERS 5 % IV SOLN
INTRAVENOUS | Status: DC
  Administered 2013-01-08: 09:00:00 via INTRAVENOUS

## 2013-01-08 MED ORDER — FENTANYL CITRATE 0.05 MG/ML IJ SOLN
INTRAMUSCULAR | Status: AC
  Filled 2013-01-08: qty 5

## 2013-01-08 MED ORDER — PROMETHAZINE HCL 25 MG/ML IJ SOLN: 6.2500 mg | INTRAMUSCULAR | Status: DC | PRN

## 2013-01-08 MED ORDER — DEXAMETHASONE SODIUM PHOSPHATE 10 MG/ML IJ SOLN
INTRAMUSCULAR | Status: DC | PRN
  Administered 2013-01-08: 10 mg via INTRAVENOUS

## 2013-01-08 MED ORDER — PHENYLEPHRINE HCL 10 MG/ML IJ SOLN
INTRAMUSCULAR | Status: AC
  Filled 2013-01-08: qty 1

## 2013-01-08 MED ORDER — ONDANSETRON HCL 4 MG/2ML IJ SOLN: 4.0000 mg | Freq: Four times a day (QID) | INTRAMUSCULAR | Status: DC | PRN

## 2013-01-08 MED ORDER — INDIGOTINDISULFONATE SODIUM 8 MG/ML IJ SOLN
INTRAMUSCULAR | Status: AC
  Filled 2013-01-08: qty 5

## 2013-01-08 SURGICAL SUPPLY — 25 items
CANISTER SUCTION 2500CC (MISCELLANEOUS) ×2 IMPLANT
CLOTH BEACON ORANGE TIMEOUT ST (SAFETY) ×2 IMPLANT
CONT PATH 16OZ SNAP LID 3702 (MISCELLANEOUS) IMPLANT
DECANTER SPIKE VIAL GLASS SM (MISCELLANEOUS) ×2 IMPLANT
GAUZE PACKING 1 X5 YD ST (GAUZE/BANDAGES/DRESSINGS) ×1 IMPLANT
GLOVE BIO SURGEON STRL SZ7 (GLOVE) ×2 IMPLANT
GOWN PREVENTION PLUS LG XLONG (DISPOSABLE) ×9 IMPLANT
NDL SPNL 22GX3.5 QUINCKE BK (NEEDLE) IMPLANT
NEEDLE HYPO 22GX1.5 SAFETY (NEEDLE) ×1 IMPLANT
NEEDLE SPNL 22GX3.5 QUINCKE BK (NEEDLE) IMPLANT
NS IRRIG 1000ML POUR BTL (IV SOLUTION) ×2 IMPLANT
PACK VAGINAL WOMENS (CUSTOM PROCEDURE TRAY) ×2 IMPLANT
SET CYSTO W/LG BORE CLAMP LF (SET/KITS/TRAYS/PACK) IMPLANT
SUT CHROMIC 2 0 CT 1 (SUTURE) IMPLANT
SUT SILK 2 0 FSL 18 (SUTURE) IMPLANT
SUT VIC AB 2-0 CT1 27 (SUTURE)
SUT VIC AB 2-0 CT1 TAPERPNT 27 (SUTURE) IMPLANT
SUT VIC AB 2-0 SH 27 (SUTURE)
SUT VIC AB 2-0 SH 27XBRD (SUTURE) IMPLANT
SUT VIC AB 2-0 UR6 27 (SUTURE) ×6 IMPLANT
SUT VICRYL RAPIDE 3-0 36IN (SUTURE) ×4 IMPLANT
SYR CONTROL 10ML LL (SYRINGE) ×2 IMPLANT
TOWEL OR 17X24 6PK STRL BLUE (TOWEL DISPOSABLE) ×4 IMPLANT
TRAY FOLEY CATH 14FR (SET/KITS/TRAYS/PACK) ×2 IMPLANT
WATER STERILE IRR 1000ML POUR (IV SOLUTION) ×1 IMPLANT

## 2013-01-08 NOTE — Progress Notes (Signed)
The patient was re-examined with no change in status 

## 2013-01-08 NOTE — Anesthesia Postprocedure Evaluation (Signed)
  Anesthesia Post-op Note  Patient: Ruth Gregory  Procedure(s) Performed: Procedure(s): ANTERIOR REPAIR (CYSTOCELE) (N/A)  Patient Location: [facility]  Anesthesia Type:General  Level of Consciousness: awake, alert  and oriented  Airway and Oxygen Therapy: Patient Spontanous Breathing  Post-op Pain: mild  Post-op Assessment: Post-op Vital signs reviewed and Patient's Cardiovascular Status Stable  Post-op Vital Signs: Reviewed and stable  Complications: No apparent anesthesia complications

## 2013-01-08 NOTE — H&P (Signed)
NAME:  Ruth Gregory, PRISK NO.:  [id number]  MEDICAL RECORD NO.:  [id number]  LOCATION:                                 FACILITY:  PHYSICIAN:  Duke Salvia. Marcelle Overlie, M.D.DATE OF BIRTH:  [birth year]  DATE OF ADMISSION: DATE OF DISCHARGE:                             HISTORY & PHYSICAL   CHIEF COMPLAINT:  Cystocele, vaginal pressure.  HISTORY OF PRESENT ILLNESS:  This is a 52 year old, G3, P2.  In 2012, this patient had a vaginal hysterectomy with mid urethral sling done by Dr. Morrell Riddle at [facility].  I first met her 7/14 with concerns about pelvic pressure from cystocele noted.  At that time, we performed [facility], which did return in the menopausal range.  She has a history of a placement of a carotid stent, and her neurologist had recommended again systemic ERT, although they did approve vaginal estrogen for local symptoms.  She has had increasing problems related to discomfort during sex and pelvic pressure related to the cystocele and now requests surgical repair.  Her exam showed normal cuff support with normal posterior wall support and she has remained continent.  The specific risks related to the procedure including risk of bleeding, infection, other complications that may require additional surgery along with her expected recovery time reviewed with her, which she understands and accepts.  She has met with anesthesia preoperatively and has been off of her daily adult baby aspirin in preparation for surgery.  PAST MEDICAL HISTORY:  ALLERGIES:  Penicillin and sulfa.  CURRENT MEDICATIONS:  Synthroid, Wellbutrin, fenofibrate, vitamin D, adult aspirin daily which is currently on hold, Zyrtec p.r.n., she is also getting Botox treatments by her neurologist.  PAST SURGICAL HISTORY:  Vaginal hysterectomy with mid urethral sling in 2012, thyroid surgery in 2008, stent placement for carotid partial occlusion in 2009.  REVIEW OF SYSTEMS:  Significant for history of  headache, thyroid disease.  FAMILY HISTORY:  Otherwise unremarkable.  Her mother died recently of pancreatic and gallbladder cancer.  Also, history of heart disease, osteoporosis, and diverticulosis.  SOCIAL HISTORY:  Denies alcohol, tobacco, or drug use.  She is married. Dr. Yates Decamp is her medical doctor.  She also has a cardiologist and a neurologist that she sees.  PHYSICAL EXAMINATION:  VITAL SIGNS:  Temp 98.2, blood pressure 120/78. HEENT:  Unremarkable. NECK:  Supple without masses. LUNGS:  Cleared. CARDIOVASCULAR:  Regular rhythm without murmurs, rubs, gallops. BREASTS:  Without masses.  She does have implants. ABDOMEN:  Soft, flat, nontender. GU:  Vulva vagina reveals a moderate cystocele.  Cuff support is good. Posterior support is normal.  Bimanual otherwise negative.  IMPRESSION:  Symptomatic cystocele.  PLAN:  Anterior repair.  Procedure and risks discussed as above.     Gila Lauf M. Marcelle Overlie, M.D.     [facility]/MEDQ  D:  01/01/2013  T:  01/02/2013  Job:  [id number]

## 2013-01-08 NOTE — Progress Notes (Signed)
Pt asking about going home this evening.  VSS, tolerating PO fluids, UO in 3 hours.  Phoned Dr. Marcelle Overlie with above information.  See orders to remove Foley catheter and vaginal packing.  He requested that RN phone in a few hours with update of patient's progress.

## 2013-01-08 NOTE — Op Note (Signed)
Preoperative diagnosis: Symptomatic cystocele  Postoperative diagnosis: Same  Procedure: Cystocele repair  Surgeon: Marcelle Overlie  Anesthesia: Gen.  EBL: Less than 50 cc  Procedure and findings:  Patient taken the operating room after an adequate level of general anesthesia was obtained with the patient's legs in stirrups the perineum and vagina were prepped and draped in usual fashion for vaginal procedures. Foley catheter was positioned. Appropriate timeout for taken at that point. Weighted speculum was positioned the UV angle was grasped with an Allis clamp, the vaginal cuff was identified in the midline was infiltrated with dilute 1% Xylocaine with epi. A midline incision was made from the cuff to just below the UV angle, sharp and blunt dissection was then used to separate the perivesical fascia on each side. The fascia was then plicated in the midline with 2-0 Vicryl interrupted sutures. Excess vaginal mucosa was trimmed and then reapproximated with 2-0 Vicryl interrupted sutures. There was minimal bleeding she tolerated this well clear urine noted at the end of the case went to PACU in good condition.  Dictated with [facility]  Evee Liska M. Milana Obey.D.

## 2013-01-08 NOTE — Progress Notes (Signed)
Pt discharged to home with son, Alycia Rossetti.  Condition stable.  Pt to car via wheelchair with RN.  No equipment for home ordered at discharge.

## 2013-01-08 NOTE — Progress Notes (Signed)
Pt has eaten a meal. Ambulated in the hall twice. Voided twice 200 ml and then 400 ml.  Pain well controlled with Ibuprofen and Percocet.  Phoned Dr. Marcelle Overlie with this update.  He stated he would write a discharge from the office.

## 2013-01-08 NOTE — Transfer of Care (Signed)
Immediate Anesthesia Transfer of Care Note  Patient: Ruth Gregory  Procedure(s) Performed: Procedure(s): ANTERIOR REPAIR (CYSTOCELE) (N/A)  Patient Location: PACU  Anesthesia Type:General  Level of Consciousness: awake, alert  and oriented  Airway & Oxygen Therapy: Patient Spontanous Breathing and Patient connected to nasal cannula oxygen  Post-op Assessment: Report given to PACU RN and Post -op Vital signs reviewed and stable  Post vital signs: Reviewed and stable  Complications: No apparent anesthesia complications

## 2013-01-08 NOTE — Anesthesia Postprocedure Evaluation (Signed)
  Anesthesia Post-op Note  Anesthesia Post Note  Patient: Ruth Gregory  Procedure(s) Performed: Procedure(s) (LRB): ANTERIOR REPAIR (CYSTOCELE) (N/A)  Anesthesia type: General  Patient location: PACU  Post pain: Pain level controlled  Post assessment: Post-op Vital signs reviewed  Last Vitals:  Filed Vitals:   01/08/13 0915  BP: 121/79  Pulse: 83  Temp:   Resp: 14    Post vital signs: Reviewed  Level of consciousness: sedated  Complications: No apparent anesthesia complications

## 2013-01-08 NOTE — Anesthesia Preprocedure Evaluation (Signed)
Anesthesia Evaluation  Patient identified by MRN, date of birth, ID band Patient awake    Reviewed: Allergy & Precautions, H&P , NPO status , Patient's Chart, lab work & pertinent test results, reviewed documented beta blocker date and time   History of Anesthesia Complications Negative for: history of anesthetic complications  Airway Mallampati: III TM Distance: >3 FB Neck ROM: full    Dental  (+) Teeth Intact   Pulmonary asthma (uses inhaler in hot and humid weather) ,  breath sounds clear to auscultation        Cardiovascular + Peripheral Vascular Disease (carotid artery dissection 2009, residual left hemiparesis) Rhythm:regular Rate:Normal     Neuro/Psych  Headaches (weekly migraines - on topamax daily for prevention, takes vicodin and sometimes prednisone with HAs), PSYCHIATRIC DISORDERS (anxiety, depression - reports recent social stressors) CVA (carotid artery dissection in 2009, residual left hemiparesis), Residual Symptoms    GI/Hepatic Neg liver ROS, GERD-  Medicated,  Endo/Other  Hypothyroidism   Renal/GU negative Renal ROS Bladder dysfunction Female GU complaint     Musculoskeletal   Abdominal   Peds  Hematology negative hematology ROS (+)   Anesthesia Other Findings   Reproductive/Obstetrics (+) Pregnancy                           Anesthesia Physical Anesthesia Plan  ASA: III  Anesthesia Plan: General LMA   Post-op Pain Management:    Induction:   Airway Management Planned:   Additional Equipment:   Intra-op Plan:   Post-operative Plan:   Informed Consent: I have reviewed the patients History and Physical, chart, labs and discussed the procedure including the risks, benefits and alternatives for the proposed anesthesia with the patient or authorized representative who has indicated his/her understanding and acceptance.     Plan Discussed with: Surgeon and  CRNA  Anesthesia Plan Comments: (Patient very concerned about PONV (has not had with preventative treatment with last two surgeries).  Discussed options of spinal vs. GA - pt refuses spinal.)        Anesthesia Quick Evaluation

## 2013-01-09 ENCOUNTER — Encounter (HOSPITAL_COMMUNITY): Payer: Self-pay | Admitting: Obstetrics and Gynecology

## 2013-01-16 ENCOUNTER — Telehealth: Payer: Self-pay | Admitting: *Deleted

## 2013-01-16 NOTE — Telephone Encounter (Signed)
I have called her, she can take magnesium oxide 400mg  bid, riboflavin 100mg  bid.  She has  Bladder tack surgery a week ago.  She is taking cambien as needed for migraine abortion.

## 2013-01-18 NOTE — Discharge Summary (Signed)
Physician Discharge Summary  Patient ID: GIULLIANA MCROBERTS MRN: [id number] DOB/AGE: 09-27-1960 52 y.o.  Admit date: 01/08/2013 Discharge date: 01/18/2013  Admission Diagnoses:  Discharge Diagnoses:  Active Problems:   * No active hospital problems. *   Discharged Condition: good  Hospital Course: adm for ant repair, obsv overnight , cath DC, able to void OK, afeb, ready for DC  Consults: None  Significant Diagnostic Studies: labs:  CBC    Component Value Date/Time   WBC 5.6 01/01/2013 0940   RBC 4.49 01/01/2013 0940   HGB 13.8 01/01/2013 0940   HCT 40.5 01/01/2013 0940   PLT 204 01/01/2013 0940   MCV 90.2 01/01/2013 0940   MCH 30.7 01/01/2013 0940   MCHC 34.1 01/01/2013 0940   RDW 12.4 01/01/2013 0940   LYMPHSABS 1.2 09/05/2007 0812   MONOABS 0.5 09/05/2007 0812   EOSABS 0.2 09/05/2007 0812   BASOSABS 0.0 09/05/2007 0812      Treatments: surgery: cystocele repair  Discharge Exam: Blood pressure 117/72, pulse 88, temperature 98.1 F (36.7 C), temperature source Oral, resp. rate 17, height 5' 8.5" (1.74 m), weight 155 lb (70.308 kg), SpO2 98.00%. abd soft, + BS  Disposition: 01-Home or Self Care     Medication List         ALPRAZolam 1 MG tablet  Commonly known as:  XANAX  Take 0.5 mg by mouth daily as needed for anxiety (took 0.5 mgm at 0115 this Am).     aspirin 325 MG EC tablet  Take 325 mg by mouth daily.     buPROPion 300 MG 24 hr tablet  Commonly known as:  WELLBUTRIN XL  Take 300 mg by mouth daily.     cetirizine 10 MG tablet  Commonly known as:  ZYRTEC  Take 10 mg by mouth daily.     Diclofenac Potassium 50 MG Pack  Commonly known as:  CAMBIA  Take 50 mg by mouth daily.     fenofibrate micronized 134 MG capsule  Commonly known as:  LOFIBRA  Take 134 mg by mouth daily.     FLAX SEEDS PO  Take 1 capsule by mouth daily.     HYDROcodone-acetaminophen 5-325 MG per tablet  Commonly known as:  NORCO/VICODIN  Take 325 tablets by mouth every 6 (six) hours as  needed (migraine).     levothyroxine 100 MCG tablet  Commonly known as:  SYNTHROID, LEVOTHROID  Take 100 mcg by mouth daily before breakfast.     multivitamin with minerals Tabs tablet  Take 1 tablet by mouth daily.     predniSONE 20 MG tablet  Commonly known as:  DELTASONE  Take 20 mg by mouth daily as needed (migraine).     prochlorperazine 5 MG tablet  Commonly known as:  COMPAZINE  Take 5 mg by mouth daily as needed for nausea.     PROVENTIL HFA 108 (90 BASE) MCG/ACT inhaler  Generic drug:  albuterol  Inhale 2 puffs into the lungs every 6 (six) hours as needed for wheezing or shortness of breath.     ranitidine 300 MG tablet  Commonly known as:  ZANTAC  Take 300 mg by mouth daily as needed for heartburn.     rosuvastatin 20 MG tablet  Commonly known as:  CRESTOR  Take 20 mg by mouth every other day.     topiramate 50 MG tablet  Commonly known as:  TOPAMAX  Take 150 mg by mouth daily.     traMADol 50 MG tablet  Commonly known as:  ULTRAM  Take 50 mg by mouth every 6 (six) hours as needed for pain.     vitamin B-12 1000 MCG tablet  Commonly known as:  CYANOCOBALAMIN  Take 1,000 mcg by mouth daily.     Vitamin D (Ergocalciferol) 50000 UNITS Caps capsule  Commonly known as:  DRISDOL  Take 50,000 Units by mouth every other day. 1.25 mg           Follow-up Information   Follow up with Meriel Pica, MD. Schedule an appointment as soon as possible for a visit in 10 days.   Specialty:  Obstetrics and Gynecology   Contact information:   [address] [place] [address] [phone]       Signed: Meriel Pica 01/18/2013, 7:12 AM

## 2013-01-31 ENCOUNTER — Encounter: Payer: Self-pay | Admitting: Internal Medicine

## 2013-02-01 ENCOUNTER — Ambulatory Visit: Payer: BC Managed Care – PPO | Admitting: Emergency Medicine

## 2013-02-01 ENCOUNTER — Encounter: Payer: Self-pay | Admitting: Emergency Medicine

## 2013-02-01 ENCOUNTER — Ambulatory Visit (HOSPITAL_COMMUNITY)
Admission: RE | Admit: 2013-02-01 | Discharge: 2013-02-01 | Disposition: A | Discharge status: Home / Self Care | Payer: BC Managed Care – PPO | Source: Ambulatory Visit | Attending: Emergency Medicine | Admitting: Emergency Medicine

## 2013-02-01 VITALS — BP 116/80 | HR 96 | Temp 98.0°F | Resp 20 | Ht 68.25 in | Wt 158.0 lb

## 2013-02-01 DIAGNOSIS — J209 Acute bronchitis, unspecified: Secondary | ICD-10-CM

## 2013-02-01 DIAGNOSIS — R0602 Shortness of breath: Secondary | ICD-10-CM

## 2013-02-01 DIAGNOSIS — J309 Allergic rhinitis, unspecified: Secondary | ICD-10-CM

## 2013-02-01 DIAGNOSIS — I10 Essential (primary) hypertension: Secondary | ICD-10-CM | POA: Insufficient documentation

## 2013-02-01 DIAGNOSIS — G43909 Migraine, unspecified, not intractable, without status migrainosus: Secondary | ICD-10-CM

## 2013-02-01 LAB — CBC WITH DIFFERENTIAL/PLATELET
Basophils Absolute: 0 10*3/uL (ref 0.0–0.1)
Basophils Relative: 0 % (ref 0–1)
Eosinophils Absolute: 0.2 10*3/uL (ref 0.0–0.7)
Eosinophils Relative: 3 % (ref 0–5)
HCT: 36.8 % (ref 36.0–46.0)
Hemoglobin: 13.3 g/dL (ref 12.0–15.0)
Lymphocytes Relative: 25 % (ref 12–46)
Lymphs Abs: 1.4 10*3/uL (ref 0.7–4.0)
MCH: 33.1 pg (ref 26.0–34.0)
MCHC: 36.1 g/dL — ABNORMAL HIGH (ref 30.0–36.0)
MCV: 91.5 fL (ref 78.0–100.0)
Monocytes Absolute: 0.4 10*3/uL (ref 0.1–1.0)
Monocytes Relative: 7 % (ref 3–12)
Neutro Abs: 3.6 10*3/uL (ref 1.7–7.7)
Neutrophils Relative %: 65 % (ref 43–77)
Platelets: 225 10*3/uL (ref 150–400)
RBC: 4.02 MIL/uL (ref 3.87–5.11)
RDW: 13.5 % (ref 11.5–15.5)
WBC: 5.6 10*3/uL (ref 4.0–10.5)

## 2013-02-01 MED ORDER — TRAMADOL HCL 50 MG PO TABS: 50.0000 mg | ORAL_TABLET | Freq: Four times a day (QID) | ORAL | Status: DC | PRN

## 2013-02-01 MED ORDER — BENZONATATE 100 MG PO CAPS: 100.0000 mg | ORAL_CAPSULE | Freq: Three times a day (TID) | ORAL | Status: DC | PRN

## 2013-02-01 MED ORDER — PREDNISONE 10 MG PO TABS: ORAL_TABLET | ORAL | Status: DC

## 2013-02-01 MED ORDER — AZITHROMYCIN 250 MG PO TABS: ORAL_TABLET | ORAL | Status: DC

## 2013-02-01 NOTE — Patient Instructions (Signed)
Allergic Rhinitis Allergic rhinitis is when the mucous membranes in the nose respond to allergens. Allergens are particles in the air that cause your body to have an allergic reaction. This causes you to release allergic antibodies. Through a chain of events, these eventually cause you to release histamine into the blood stream (hence the use of antihistamines). Although meant to be protective to the body, it is this release that causes your discomfort, such as frequent sneezing, congestion and an itchy runny nose.  CAUSES  The pollen allergens may come from grasses, trees, and weeds. This is seasonal allergic rhinitis, or "hay fever." Other allergens cause year-round allergic rhinitis (perennial allergic rhinitis) such as house dust mite allergen, pet dander and mold spores.  SYMPTOMS   Nasal stuffiness (congestion).  Runny, itchy nose with sneezing and tearing of the eyes.  There is often an itching of the mouth, eyes and ears. It cannot be cured, but it can be controlled with medications. DIAGNOSIS  If you are unable to determine the offending allergen, skin or blood testing may find it. TREATMENT   Avoid the allergen.  Medications and allergy shots (immunotherapy) can help.  Hay fever may often be treated with antihistamines in pill or nasal spray forms. Antihistamines block the effects of histamine. There are over-the-counter medicines that may help with nasal congestion and swelling around the eyes. Check with your caregiver before taking or giving this medicine. If the treatment above does not work, there are many new medications your caregiver can prescribe. Stronger medications may be used if initial measures are ineffective. Desensitizing injections can be used if medications and avoidance fails. Desensitization is when a patient is given ongoing shots until the body becomes less sensitive to the allergen. Make sure you follow up with your caregiver if problems continue. SEEK MEDICAL  CARE IF:   You develop fever (more than 100.5 F (38.1 C).  You develop a cough that does not stop easily (persistent).  You have shortness of breath.  You start wheezing.  Symptoms interfere with normal daily activities. Document Released: 12/08/2000 Document Revised: 06/07/2011 Document Reviewed: 06/19/2008 [facility] Patient Information 2014 [place], [place]. Acute Bronchitis Bronchitis is when the airways that extend from the windpipe into the lungs get red, puffy, and painful (inflamed). Bronchitis often causes thick spit (mucus) to develop. This leads to a cough. A cough is the most common symptom of bronchitis. In acute bronchitis, the condition usually begins suddenly and goes away over time (usually in 2 weeks). Smoking, allergies, and asthma can make bronchitis worse. Repeated episodes of bronchitis may cause more lung problems. HOME CARE  Rest.  Drink enough fluids to keep your pee (urine) clear or pale yellow (unless you need to limit fluids as told by your doctor).  Only take over-the-counter or prescription medicines as told by your doctor.  Avoid smoking and secondhand smoke. These can make bronchitis worse. If you are a smoker, think about using nicotine gum or skin patches. Quitting smoking will help your lungs heal faster.  Reduce the chance of getting bronchitis again by:  Washing your hands often.  Avoiding people with cold symptoms.  Trying not to touch your hands to your mouth, nose, or eyes.  Follow up with your doctor as told. GET HELP IF: Your symptoms do not improve after 1 week of treatment. Symptoms include:  Cough.  Fever.  Coughing up thick spit.  Body aches.  Chest congestion.  Chills.  Shortness of breath.  Sore throat. GET HELP RIGHT  AWAY IF:   You have an increased fever.  You have chills.  You have severe shortness of breath.  You have bloody thick spit (sputum).  You throw up (vomit) often.  You lose too much body  fluid (dehydration).  You have a severe headache.  You faint. MAKE SURE YOU:   Understand these instructions.  Will watch your condition.  Will get help right away if you are not doing well or get worse. Document Released: 09/01/2007 Document Revised: 11/15/2012 Document Reviewed: 09/05/2012 [facility] Patient Information 2014 [place], [place].

## 2013-02-02 ENCOUNTER — Telehealth: Payer: Self-pay | Admitting: *Deleted

## 2013-02-02 LAB — BRAIN NATRIURETIC PEPTIDE: Brain Natriuretic Peptide: 6.4 pg/mL (ref 0.0–100.0)

## 2013-02-02 NOTE — Telephone Encounter (Signed)
Spoke with pt.  Informed her Chest Xray WNL per Loree Fee, PA-C.  Pt to call if no relief with SOB symptoms.

## 2013-02-02 NOTE — Telephone Encounter (Signed)
Spoke with pt about lab results from 02/01/13. Advised pt to call if no relief with SOB symptoms.  Pt states feeling better today and positive improvement with Tessalon Perles Rx for cough.  WCB if no relief with symptoms.

## 2013-02-02 NOTE — Progress Notes (Signed)
Subjective:    Patient ID: Ruth Gregory, female    DOB: [birth year], 52 y.o.   MRN: [id number]  HPI Comments: 52 yo female with history of recent surgery 3 weeks ago to repair bladder positioning presents with 2-3 weeks of mild SOB, tight chest, and cough that is now productive green. She has tried using her inhaler with minimal relief. She also wants to D/C Topamax due to questionable mental fog with medicine. She has not tried any OTC.  Asthma She complains of cough and shortness of breath. Associated symptoms include postnasal drip. Her past medical history is significant for asthma.    Current Outpatient Prescriptions on File Prior to Visit  Medication Sig Dispense Refill  . ALPRAZolam (XANAX) 1 MG tablet Take 0.5 mg by mouth daily as needed for anxiety (took 0.5 mgm at 0115 this Am).       Marland Kitchen aspirin 325 MG EC tablet Take 325 mg by mouth daily.      Marland Kitchen buPROPion (WELLBUTRIN XL) 300 MG 24 hr tablet Take 300 mg by mouth daily.      . cetirizine (ZYRTEC) 10 MG tablet Take 10 mg by mouth daily.      . fenofibrate micronized (LOFIBRA) 134 MG capsule Take 134 mg by mouth daily.      . Flaxseed, Linseed, (FLAX SEEDS PO) Take 1 capsule by mouth daily.      Marland Kitchen HYDROcodone-acetaminophen (NORCO/VICODIN) 5-325 MG per tablet Take 325 tablets by mouth every 6 (six) hours as needed (migraine).       Marland Kitchen levothyroxine (SYNTHROID, LEVOTHROID) 100 MCG tablet Take 100 mcg by mouth daily before breakfast.      . Multiple Vitamin (MULTIVITAMIN WITH MINERALS) TABS tablet Take 1 tablet by mouth daily.      . prochlorperazine (COMPAZINE) 5 MG tablet Take 5 mg by mouth daily as needed for nausea.       Marland Kitchen PROVENTIL HFA 108 (90 BASE) MCG/ACT inhaler Inhale 2 puffs into the lungs every 6 (six) hours as needed for wheezing or shortness of breath.       . ranitidine (ZANTAC) 300 MG tablet Take 300 mg by mouth daily as needed for heartburn.       . rosuvastatin (CRESTOR) 20 MG tablet Take 20 mg by mouth every other day.        . topiramate (TOPAMAX) 50 MG tablet Take 150 mg by mouth daily.       . vitamin B-12 (CYANOCOBALAMIN) 1000 MCG tablet Take 1,000 mcg by mouth daily.      . Vitamin D, Ergocalciferol, (DRISDOL) 50000 UNITS CAPS capsule Take 50,000 Units by mouth every other day. 1.25 mg      . predniSONE (DELTASONE) 20 MG tablet Take 20 mg by mouth daily as needed (migraine).        No current facility-administered medications on file prior to visit.  ALLergies Penicillins; Imitrex; Other; Sulfa antibiotics; and Zomig  Past Medical History  Diagnosis Date  . Anxiety   . Spasm of muscle   . Occlusion and stenosis of carotid artery with cerebral infarction   . Headache(784.0)   . Unspecified cerebral artery occlusion with cerebral infarction   . Elevated cholesterol     on Crestor  . Depression   . Hypothyroidism     S/P thyroidectomy  . Asthma   . Chronic heartburn   . Hypertension   . Anemia   . Migraine   . Vitamin D deficiency     Review of  Systems  Constitutional: Positive for fatigue.  HENT: Positive for postnasal drip.   Eyes: Negative.   Respiratory: Positive for cough, chest tightness and shortness of breath.   Cardiovascular: Negative.   Gastrointestinal: Negative.   Endocrine: Negative.   Genitourinary: Negative.   Musculoskeletal: Negative.   Allergic/Immunologic: Negative.   Neurological: Negative.   Hematological: Negative.   Psychiatric/Behavioral: Positive for confusion.      BP 116/80  Pulse 96  Temp(Src) 98 F (36.7 C) (Temporal)  Resp 20  Ht 5' 8.25" (1.734 m)  Wt 158 lb (71.668 kg)  BMI 23.84 kg/m2   Objective:   Physical Exam  Nursing note and vitals reviewed. Constitutional: She appears well-developed and well-nourished.  HENT:  Head: Normocephalic and atraumatic.  Right Ear: External ear normal.  Left Ear: External ear normal.  Mouth/Throat: Oropharynx is clear and moist.  Bilateral TM yellow   Eyes: Pupils are equal, round, and reactive to light.   Neck: Normal range of motion. Neck supple.  Cardiovascular: Normal rate, regular rhythm, normal heart sounds and intact distal pulses.   Pulmonary/Chest: Effort normal.  Barky cough  Abdominal: Soft.  Musculoskeletal: Normal range of motion.  Neurological: She is alert.  Skin: Skin is warm and dry.  Psychiatric: She has a normal mood and affect. Judgment normal.          Assessment & Plan:  Cough probable Bronchitis but with PMH and recent surgery will get CXR and Labs. Advised PT ER if any increased symptoms. Zpak#1 AD, T. Perles 100mg  AD, increase inhaler to TID x 3days, Pred 10 mg DP AD. Topamax can try slowly to decrease dosage and monitor for any migraine increase and mental status change.

## 2013-02-02 NOTE — Telephone Encounter (Signed)
Message copied by Nicholaus Corolla A on Fri Feb 02, 2013 11:16 AM ------      Message from: [place], [place] R      Created: Fri Feb 02, 2013 10:55 AM       CXR WNL please advise patient ------

## 2013-02-02 NOTE — Telephone Encounter (Signed)
Message copied by Nicholaus Corolla A on Fri Feb 02, 2013  1:56 PM ------      Message from: [place], [place] R      Created: Fri Feb 02, 2013  1:44 PM       No reason for SOB probable bronchitis, no sign of PE with CXR or labs. ER if symptoms increase. Call if no change with current treatment. ------

## 2013-02-12 ENCOUNTER — Other Ambulatory Visit: Payer: Self-pay | Admitting: Emergency Medicine

## 2013-02-12 ENCOUNTER — Telehealth: Payer: Self-pay | Admitting: *Deleted

## 2013-02-12 MED ORDER — AZITHROMYCIN 250 MG PO TABS: ORAL_TABLET | ORAL | Status: AC

## 2013-02-12 NOTE — Telephone Encounter (Signed)
PT HAS FINISHED ANTIBIOTICS FEELS  BETTER  BREATHING &  ECT.  BUT STILL HAVING DISCOLORED MUCUS . SAID SHE WAS TOLD TO CALL IF NOT COMPLETELY CLEARED FOR A REFILL. [facility] [address]   [id number] FAX [phone]

## 2013-03-14 ENCOUNTER — Encounter: Payer: Self-pay | Admitting: Neurology

## 2013-03-14 ENCOUNTER — Encounter (INDEPENDENT_AMBULATORY_CARE_PROVIDER_SITE_OTHER): Payer: Self-pay

## 2013-03-14 ENCOUNTER — Ambulatory Visit (INDEPENDENT_AMBULATORY_CARE_PROVIDER_SITE_OTHER): Payer: BC Managed Care – PPO | Admitting: Neurology

## 2013-03-14 ENCOUNTER — Other Ambulatory Visit: Payer: Self-pay | Admitting: Internal Medicine

## 2013-03-14 VITALS — BP 139/87 | HR 103 | Ht 67.75 in | Wt 157.0 lb

## 2013-03-14 DIAGNOSIS — G8110 Spastic hemiplegia affecting unspecified side: Secondary | ICD-10-CM

## 2013-03-14 DIAGNOSIS — G8114 Spastic hemiplegia affecting left nondominant side: Secondary | ICD-10-CM

## 2013-03-15 MED ORDER — ONABOTULINUMTOXINA 100 UNITS IJ SOLR
300.0000 [IU] | Freq: Once | INTRAMUSCULAR | Status: AC
  Administered 2013-03-15: 300 [IU] via INTRAMUSCULAR

## 2013-03-15 NOTE — Progress Notes (Signed)
  History of Present Illness: [name] is here for EMG guided BOTOX injection.  She has history of right internal carotid artery dissection following an episode of vomiting from migraine headache in mid June 2009. She was given IV TPA and found to have right MCA occlusion. She underwent emergent right carotid artery stent with distal endovascular recanalization of the middle cerebral artery.  She had small hemorrhagic transformation of the right basal ganglia with mild left hemiparesis, but has done well since then and has been independent.  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  She has been receiving BOTOX injection since 01/2009 q3 months, to her left upper extremity and also left lower extremity, responded well.  Repeat US carotid were normal in October 2013  UPDATE 03/15/2013:  Last EMG guided injection was in Sep 2014, she responded very well, especially her left leg, she complains of left hand finger stiffness and flexion, especially left thumb.   Physical Exam  General: Pleasant Caucasian lady , in no distress.  Afebrile.   Head: nontraumatic Musculoskeletal: no deformity Skin: no rash, petechiae  Neurologic Exam  Mental Status: pleasant, awake, alert, cooperative to history, talking, and casual conversation. Cranial Nerves: CN II-XII pupils were equal round reactive to light.   Extraocular movements were full.  Visual fields were full on confrontational test.  slight shallow  left nasolabial fold.  Hearing was intact to finger rubbing bilaterally.  Uvula tongue were midline.  Head turning and shoulder shrugging were normal and symmetric.  Tongue protrusion into the cheeks strength were normal.  Motor: mild spastic hemiparesis of left arm, tends to stay at left elbow flexion, pronation, mild left shoulder droop, decreased arm swing when ambulating. Mild left finger flexion, weak grip 4, left thumb tends to stay in flexed position.  Left ankle slight limited range of motion, tends to stay in left ankle plantar inversion. Sensory: Normal to light touch, pinprick, proprioception, and vibratory sensation. Coordination: There was no dysmetria noticed. Gait and Station: Narrow based and steady, mild left ankle plantar inversion, decreased left arm swing, slight left shoulder droop Reflexes: Deep tendon reflexes are 2+, hyper reflexia on left side  Plantars are downgoing.     Assessment and Plan:  Spastic left hemiparesis, due to right MCA stroke in June 2009, right internal carotid artery dissection, s/p right IC stent.   under EMG guidance 300units of botox (Lot No.  [id number]  Exp May 2017), 100 units dissolved in 2cc of NS.  Left brachialis 50  Left flexor digitorum profundi 25 Left pronator 25 Left flexor digitorum superficialis 25 Left palmaris longus  25 Left flexor carpi ulnaris 25  Left pectoralis major 25 units Left teres major 25 units   Left flexor digitorum longus 25 units Left tibialis posterior 50 units  She will return in 3 months for repeat injection,

## 2013-03-23 ENCOUNTER — Other Ambulatory Visit: Payer: Self-pay | Admitting: Internal Medicine

## 2013-04-04 ENCOUNTER — Encounter: Payer: Self-pay | Admitting: Internal Medicine

## 2013-04-04 ENCOUNTER — Ambulatory Visit (INDEPENDENT_AMBULATORY_CARE_PROVIDER_SITE_OTHER): Payer: BC Managed Care – PPO | Admitting: Internal Medicine

## 2013-04-04 VITALS — BP 134/86 | HR 88 | Temp 97.5°F | Resp 16 | Ht 68.5 in | Wt 157.6 lb

## 2013-04-04 DIAGNOSIS — R740 Nonspecific elevation of levels of transaminase and lactic acid dehydrogenase [LDH]: Secondary | ICD-10-CM

## 2013-04-04 DIAGNOSIS — R7402 Elevation of levels of lactic acid dehydrogenase (LDH): Secondary | ICD-10-CM

## 2013-04-04 DIAGNOSIS — Z113 Encounter for screening for infections with a predominantly sexual mode of transmission: Secondary | ICD-10-CM

## 2013-04-04 DIAGNOSIS — R7401 Elevation of levels of liver transaminase levels: Secondary | ICD-10-CM

## 2013-04-04 DIAGNOSIS — R51 Headache: Secondary | ICD-10-CM

## 2013-04-04 DIAGNOSIS — Z1212 Encounter for screening for malignant neoplasm of rectum: Secondary | ICD-10-CM

## 2013-04-04 DIAGNOSIS — I10 Essential (primary) hypertension: Secondary | ICD-10-CM

## 2013-04-04 DIAGNOSIS — K2100 Gastro-esophageal reflux disease with esophagitis, without bleeding: Secondary | ICD-10-CM

## 2013-04-04 DIAGNOSIS — E559 Vitamin D deficiency, unspecified: Secondary | ICD-10-CM

## 2013-04-04 DIAGNOSIS — Z0000 Encounter for general adult medical examination without abnormal findings: Secondary | ICD-10-CM

## 2013-04-04 DIAGNOSIS — Z111 Encounter for screening for respiratory tuberculosis: Secondary | ICD-10-CM

## 2013-04-04 DIAGNOSIS — E782 Mixed hyperlipidemia: Secondary | ICD-10-CM | POA: Insufficient documentation

## 2013-04-04 DIAGNOSIS — Z79899 Other long term (current) drug therapy: Secondary | ICD-10-CM

## 2013-04-04 DIAGNOSIS — J329 Chronic sinusitis, unspecified: Secondary | ICD-10-CM | POA: Insufficient documentation

## 2013-04-04 LAB — CBC WITH DIFFERENTIAL/PLATELET
Basophils Absolute: 0 10*3/uL (ref 0.0–0.1)
Basophils Relative: 1 % (ref 0–1)
Eosinophils Absolute: 0.1 10*3/uL (ref 0.0–0.7)
Eosinophils Relative: 2 % (ref 0–5)
HCT: 40.8 % (ref 36.0–46.0)
Hemoglobin: 14.4 g/dL (ref 12.0–15.0)
Lymphocytes Relative: 34 % (ref 12–46)
Lymphs Abs: 1.8 10*3/uL (ref 0.7–4.0)
MCH: 31.6 pg (ref 26.0–34.0)
MCHC: 35.3 g/dL (ref 30.0–36.0)
MCV: 89.7 fL (ref 78.0–100.0)
Monocytes Absolute: 0.4 10*3/uL (ref 0.1–1.0)
Monocytes Relative: 6 % (ref 3–12)
Neutro Abs: 3.1 10*3/uL (ref 1.7–7.7)
Neutrophils Relative %: 57 % (ref 43–77)
Platelets: 211 10*3/uL (ref 150–400)
RBC: 4.55 MIL/uL (ref 3.87–5.11)
RDW: 13.3 % (ref 11.5–15.5)
WBC: 5.5 10*3/uL (ref 4.0–10.5)

## 2013-04-04 MED ORDER — OMEPRAZOLE 40 MG PO CPDR: 40.0000 mg | DELAYED_RELEASE_CAPSULE | Freq: Every day | ORAL | Status: DC

## 2013-04-04 MED ORDER — PREDNISONE 20 MG PO TABS: ORAL_TABLET | ORAL | Status: DC

## 2013-04-04 NOTE — Progress Notes (Signed)
Patient ID: Ruth Gregory, female   DOB: [birth year], 53 y.o.   MRN: [id number]  Annual Screening Comprehensive Examination  This very nice 53 y.o. MWF presents for screening physical.  Patient has been followed for Migraine and Rt Brain CVA attributed to Rt Carotid Artery dissection with consequent mild Lt spastic hemi-paresis, compensated Hypothyroidism, Hyperlipidemia, and Vitamin D Deficiency.    Labile HTN predates since 2008 and is monitored expectantly. Patient's BP has been normal to high normal at [facility] over the last year. Today's BP: 134/86 mmHg. Patient denies any cardiac symptoms as chest pain, palpitations, shortness of breath, dizziness or ankle swelling.Her stroke in June 2009 was felt resultant from severe vomiting/retching associated with a migraine and she underwent stenting of the Rt Internal Carotid Artery. It was speculated this dissection may be due to fibromuscular dysplasia. Nonetheless, she was left with a mildly spastic Lt hemiparesis affecting upper greater than lower extremities. Sh undergoes quarterly Botox injections by Dr Terrace Arabia to help with the spasticity in her LUE and hand. She also has had cognitive deficits with decreaced short term memory and poor recall as well as difficulties with insight and judgement. She has been unable to work and on disability.   Patient's hyperlipidemia is controlled with diet and supplements. Patient denies myalgias or other medication SE's. Last cholesterol last visit was  153, triglycerides 59, HDL 65 and LDL 76 - all at goal.     Patient is being monitored for abnormal glucose and prediabetes with last A1c 5.4% in Mar 2014 abd 5.6% in Dec 2013. Patient denies reactive hypoglycemic symptoms, visual blurring, diabetic polys, or paresthesias. She also has history of chronic and recurrent sinusitis, asthma, and mood disorders with both anxiety and depression - the latter two worse since her stroke.   Patient has Hypothyroidism related to multinodular  goiter and in 2007 had excision of a large Rt thyroid nodule.    Finally, patient has history of Vitamin D Deficiency of 23 in 2008 and with last Vitamin D 85 in Oct 2014.     Current Outpatient Prescriptions on File Prior to Visit  Medication Sig Dispense Refill  . ALPRAZolam (XANAX) 1 MG tablet TAKE 1/2 TO 1 TABLET BY MOUTH 3 TIMES A DAY AS NEEDED FOR ANXIETY  90 tablet  0  . aspirin 325 MG EC tablet Take 325 mg by mouth daily.      Marland Kitchen buPROPion (WELLBUTRIN XL) 300 MG 24 hr tablet Take 300 mg by mouth daily.      . cetirizine (ZYRTEC) 10 MG tablet Take 10 mg by mouth daily.      Marland Kitchen ESTRING 2 MG vaginal ring Place 2 mg vaginally every 3 (three) months.       . fenofibrate micronized (LOFIBRA) 134 MG capsule Take 134 mg by mouth daily.      . Flaxseed, Linseed, (FLAX SEEDS PO) Take 1 capsule by mouth daily.      Marland Kitchen HYDROcodone-acetaminophen (NORCO/VICODIN) 5-325 MG per tablet Take 325 tablets by mouth every 6 (six) hours as needed (migraine).       Marland Kitchen levothyroxine (SYNTHROID, LEVOTHROID) 100 MCG tablet Take 100 mcg by mouth daily before breakfast.      . magnesium oxide (MAG-OX) 400 MG tablet Take 400 mg by mouth 2 (two) times daily.       . Multiple Vitamin (MULTIVITAMIN WITH MINERALS) TABS tablet Take 1 tablet by mouth daily.      . prochlorperazine (COMPAZINE) 5 MG tablet Take 5 mg by  mouth daily as needed for nausea.       Marland Kitchen PROVENTIL HFA 108 (90 BASE) MCG/ACT inhaler Inhale 2 puffs into the lungs every 6 (six) hours as needed for wheezing or shortness of breath.       . ranitidine (ZANTAC) 300 MG tablet Take 300 mg by mouth daily as needed for heartburn.       . rosuvastatin (CRESTOR) 20 MG tablet Take 20 mg by mouth every other day.       . topiramate (TOPAMAX) 50 MG tablet Take 150 mg by mouth daily.       . traMADol (ULTRAM) 50 MG tablet Take 1 tablet (50 mg total) by mouth every 6 (six) hours as needed.  50 tablet  0  . vitamin B-12 (CYANOCOBALAMIN) 1000 MCG tablet Take 1,000 mcg by  mouth daily.      . Vitamin D, Ergocalciferol, (DRISDOL) 50000 UNITS CAPS capsule Take 50,000 Units by mouth every other day. 1.25 mg       No current facility-administered medications on file prior to visit.    Allergies  Allergen Reactions  . Penicillins Anaphylaxis and Hives  . Imitrex [Sumatriptan]   . Other Swelling    kiwi  . Sulfa Antibiotics Hives  . Zomig [Zolmitriptan]     Past Medical History  Diagnosis Date  . Anxiety   . Spasm of muscle   . Occlusion and stenosis of carotid artery with cerebral infarction   . Headache(784.0)   . Unspecified cerebral artery occlusion with cerebral infarction   . Elevated cholesterol     on Crestor  . Depression   . Hypothyroidism     S/P thyroidectomy  . Asthma   . Chronic heartburn   . Hypertension   . Anemia   . Migraine   . Vitamin D deficiency     Past Surgical History  Procedure Laterality Date  . Vaginal hysterectomy    . Nasal sinus surgery    . Cystocele repair N/A 01/08/2013    Procedure: ANTERIOR REPAIR (CYSTOCELE);  Surgeon: Meriel Pica, MD;  Location: [facility];  Service: Gynecology;  Laterality: N/A;  . Cosmetic surgery      Family History  Problem Relation Age of Onset  . Heart disease Mother   . Peptic Ulcer Disease Mother   . Heart disease Father   . Parkinson's disease Father     History  Substance Use Topics  . Smoking status: Never Smoker   . Smokeless tobacco: Not on file  . Alcohol Use: 0.0 oz/week     Comment: occassionally drinks a glass of wine    ROS Constitutional: Denies fever, chills, weight loss/gain, headaches, insomnia, fatigue, night sweats, and change in appetite. Eyes: Denies redness, blurred vision, diplopia, discharge, itchy, watery eyes.  ENT: Denies discharge, congestion, post nasal drip, epistaxis, sore throat, earache, hearing loss, dental pain, Tinnitus, Vertigo, Sinus pain, snoring.  Cardio: Denies chest pain, palpitations, irregular heartbeat, syncope, dyspnea,  diaphoresis, orthopnea, PND, claudication, edema Respiratory: denies cough, dyspnea, DOE, pleurisy, hoarseness, laryngitis, wheezing.  Gastrointestinal: Denies dysphagia, heartburn, reflux, water brash, pain, cramps, nausea, vomiting, bloating, diarrhea, constipation, hematemesis, melena, hematochezia, jaundice, hemorrhoids Genitourinary: Denies dysuria, frequency, urgency, nocturia, hesitancy, discharge, hematuria, flank pain Breast:Breast lumps, nipple discharge, bleeding.  Musculoskeletal: Denies arthralgia, myalgia, stiffness, Jt. Swelling, pain, limp, and strain/sprain. Skin: Denies puritis, rash, hives, warts, acne, eczema, changing in skin lesion Neuro: No weakness, tremor, incoordination, spasms, paresthesia, pain Psychiatric: Denies confusion, memory loss, sensory loss Endocrine: Denies change  in weight, skin, hair change, nocturia, and paresthesia, diabetic polys, visual blurring, hyper / hypo glycemic episodes.  Heme/Lymph: No excessive bleeding, bruising, enlarged lymph nodes.  Filed Vitals:   04/04/13 1539  BP: 134/86  Pulse: 88  Temp: 97.5 F (36.4 C)  Resp: 16    Estimated body mass index is 23.61 kg/(m^2) as calculated from the following:   Height as of this encounter: 5' 8.5" (1.74 m).   Weight as of this encounter: 157 lb 9.6 oz (71.487 kg).  Physical Exam General Appearance: Well nourished, in no apparent distress. Eyes: PERRLA, EOMs, conjunctiva no swelling or erythema, normal fundi and vessels. Sinuses: No frontal/maxillary tenderness ENT/Mouth: EACs patent / TMs  nl. Nares clear without erythema, swelling, mucoid exudates. Oral hygiene is good. No erythema, swelling, or exudate. Tongue normal, non-obstructing. Tonsils not swollen or erythematous. Hearing normal.  Neck: Supple, thyroid normal. No bruits, nodes or JVD. Respiratory: Respiratory effort normal.  BS equal and clear bilateral without rales, rhonci, wheezing or stridor. Cardio: Heart sounds are normal  with regular rate and rhythm and no murmurs, rubs or gallops. Peripheral pulses are normal and equal bilaterally without edema. No aortic or femoral bruits. Chest: symmetric with normal excursions and percussion. Breasts: Symmetric, without lumps, nipple discharge, retractions, or fibrocystic changes.  Abdomen: Flat, soft, with bowl sounds. Nontender, no guarding, rebound, hernias, masses, or organomegaly.  Lymphatics: Non tender without lymphadenopathy.  Musculoskeletal:  Mild spastic Lt HP and flexion type contracturing of the Lt wrist and hand. Otherwise full ROM of the Rt extremities, with normal joint stability, strength on the Rt  and  gait is normal . Skin: Warm and dry without rashes, lesions, cyanosis, clubbing or  ecchymosis.  Neuro: Cranial nerves intact, reflexes slightly increased on the Left and muscle tone likewise sl. Increased on the Left. No cerebellar symptoms. Sensation intact.  Pysch: Alert and oriented X 3, normal affect.   Assessment and Plan  1. Annual Screening Examination 2. Hypertension, labile - monitoring expectantly  3. Hyperlipidemia - controlled with diet and supplements 4. Pre Diabetes, monitoring 5. Vitamin D Deficiency - corrected 6. Migraine 7. Rt Brain CVA 8. Recurrent Sinusitis 9. Depression   Continue prudent diet as discussed, weight control, BP monitoring, regular exercise, and medications. Discussed med's effects and SE's. Screening labs and tests as requested with regular follow-up as recommended.

## 2013-04-04 NOTE — Patient Instructions (Addendum)
Diet for Gastroesophageal Reflux Disease, Adult Reflux is when stomach acid flows up into the esophagus. The esophagus becomes irritated and sore (inflammation). When reflux happens often and is severe, it is called gastroesophageal reflux disease (GERD). What you eat can help ease any discomfort caused by GERD. FOODS OR DRINKS TO AVOID OR LIMIT  Coffee and black tea, with or without caffeine.  Bubbly (carbonated) drinks with caffeine or energy drinks.  Strong spices, such as pepper, cayenne pepper, curry, or chili powder.  Peppermint or spearmint.  Chocolate.  High-fat foods, such as meats, fried food, oils, butter, or nuts.  Fruits and vegetables that cause discomfort. This includes citrus fruits and tomatoes.  Alcohol. If a certain food or drink irritates your GERD, avoid eating or drinking it. THINGS THAT MAY HELP GERD INCLUDE:  Eat meals slowly.  Eat 5 to 6 small meals a day, not 3 large meals.  Do not eat food for a certain amount of time if it causes discomfort.  Wait 3 hours after eating before lying down.  Keep the head of your bed raised 6 to 9 inches (15 23 centimeters). Put a foam wedge or blocks under the legs of the bed.  Stay active. Weight loss, if needed, may help ease your discomfort.  Wear loose-fitting clothing.  Do not smoke or chew tobacco. Document Released: 09/14/2011 Document Reviewed: 09/14/2011 [facility] Patient Information 2014 [place], [place].  Diet for Gastroesophageal Reflux Disease, Adult Reflux (acid reflux) is when acid from your stomach flows up into the esophagus. When acid comes in contact with the esophagus, the acid causes irritation and soreness (inflammation) in the esophagus. When reflux happens often or so severely that it causes damage to the esophagus, it is called gastroesophageal reflux disease (GERD). Nutrition therapy can help ease the discomfort of GERD. FOODS OR DRINKS TO AVOID OR LIMIT  Smoking or chewing tobacco. Nicotine  is one of the most potent stimulants to acid production in the gastrointestinal tract.  Caffeinated and decaffeinated coffee and black tea.  Regular or low-calorie carbonated beverages or energy drinks (caffeine-free carbonated beverages are allowed).   Strong spices, such as black pepper, white pepper, red pepper, cayenne, curry powder, and chili powder.  Peppermint or spearmint.  Chocolate.  High-fat foods, including meats and fried foods. Extra added fats including oils, butter, salad dressings, and nuts. Limit these to less than 8 tsp per day.  Fruits and vegetables if they are not tolerated, such as citrus fruits or tomatoes.  Alcohol.  Any food that seems to aggravate your condition. If you have questions regarding your diet, call your caregiver or a registered dietitian. OTHER THINGS THAT MAY HELP GERD INCLUDE:   Eating your meals slowly, in a relaxed setting.  Eating 5 to 6 small meals per day instead of 3 large meals.  Eliminating food for a period of time if it causes distress.  Not lying down until 3 hours after eating a meal.  Keeping the head of your bed raised 6 to 9 inches (15 to 23 cm) by using a foam wedge or blocks under the legs of the bed. Lying flat may make symptoms worse.  Being physically active. Weight loss may be helpful in reducing reflux in overweight or obese adults.  Wear loose fitting clothing EXAMPLE MEAL PLAN This meal plan is approximately 2,000 calories based on https://www.bernard.org/ meal planning guidelines. Breakfast   cup cooked oatmeal.  1 cup strawberries.  1 cup low-fat milk.  1 oz almonds. Snack  1  cup cucumber slices.  6 oz yogurt (made from low-fat or fat-free milk). Lunch  2 slice whole-wheat bread.  2 oz sliced [place].  2 tsp mayonnaise.  1 cup blueberries.  1 cup snap peas. Snack  6 whole-wheat crackers.  1 oz string cheese. Dinner   cup brown rice.  1 cup mixed veggies.  1 tsp olive oil.  3 oz  grilled fish. Document Released: 03/15/2005 Document Revised: 06/07/2011 Document Reviewed: 01/29/2011 [facility] Patient Information 2014 [place], [place].  Migraine Headache A migraine headache is an intense, throbbing pain on one or both sides of your head. A migraine can last for 30 minutes to several hours. CAUSES  The exact cause of a migraine headache is not always known. However, a migraine may be caused when nerves in the brain become irritated and release chemicals that cause inflammation. This causes pain. SYMPTOMS  Pain on one or both sides of your head.  Pulsating or throbbing pain.  Severe pain that prevents daily activities.  Pain that is aggravated by any physical activity.  Nausea, vomiting, or both.  Dizziness.  Pain with exposure to bright lights, loud noises, or activity.  General sensitivity to bright lights, loud noises, or smells. Before you get a migraine, you may get warning signs that a migraine is coming (aura). An aura may include:  Seeing flashing lights.  Seeing bright spots, halos, or zig-zag lines.  Having tunnel vision or blurred vision.  Having feelings of numbness or tingling.  Having trouble talking.  Having muscle weakness. MIGRAINE TRIGGERS  Alcohol.  Smoking.  Stress.  Menstruation.  Aged cheeses.  Foods or drinks that contain nitrates, glutamate, aspartame, or tyramine.  Lack of sleep.  Chocolate.  Caffeine.  Hunger.  Physical exertion.  Fatigue.  Medicines used to treat chest pain (nitroglycerine), birth control pills, estrogen, and some blood pressure medicines. DIAGNOSIS  A migraine headache is often diagnosed based on:  Symptoms.  Physical examination.  A CT scan or MRI of your head. TREATMENT Medicines may be given for pain and nausea. Medicines can also be given to help prevent recurrent migraines.  HOME CARE INSTRUCTIONS  Only take over-the-counter or prescription medicines for pain or discomfort  as directed by your caregiver. The use of long-term narcotics is not recommended.  Lie down in a dark, quiet room when you have a migraine.  Keep a journal to find out what may trigger your migraine headaches. For example, write down:  What you eat and drink.  How much sleep you get.  Any change to your diet or medicines.  Limit alcohol consumption.  Quit smoking if you smoke.  Get 7 to 9 hours of sleep, or as recommended by your caregiver.  Limit stress.  Keep lights dim if bright lights bother you and make your migraines worse. SEEK IMMEDIATE MEDICAL CARE IF:   Your migraine becomes severe.  You have a fever.  You have a stiff neck.  You have vision loss.  You have muscular weakness or loss of muscle control.  You start losing your balance or have trouble walking.  You feel faint or pass out.  You have severe symptoms that are different from your first symptoms. MAKE SURE YOU:   Understand these instructions.  Will watch your condition.  Will get help right away if you are not doing well or get worse. Document Released: 03/15/2005 Document Revised: 06/07/2011 Document Reviewed: 03/05/2011 [facility] Patient Information 2014 [place], [place].  Hypothyroidism The thyroid is a large gland located in  the lower front of your neck. The thyroid gland helps control metabolism. Metabolism is how your body handles food. It controls metabolism with the hormone thyroxine. When this gland is underactive (hypothyroid), it produces too little hormone.  CAUSES These include:   Absence or destruction of thyroid tissue.  Goiter due to iodine deficiency.  Goiter due to medications.  Congenital defects (since birth).  Problems with the pituitary. This causes a lack of TSH (thyroid stimulating hormone). This hormone tells the thyroid to turn out more hormone. SYMPTOMS  Lethargy (feeling as though you have no energy)  Cold intolerance  Weight gain (in spite of normal food  intake)  Dry skin  Coarse hair  Menstrual irregularity (if severe, may lead to infertility)  Slowing of thought processes Cardiac problems are also caused by insufficient amounts of thyroid hormone. Hypothyroidism in the newborn is cretinism, and is an extreme form. It is important that this form be treated adequately and immediately or it will lead rapidly to retarded physical and mental development. DIAGNOSIS  To prove hypothyroidism, your caregiver may do blood tests and ultrasound tests. Sometimes the signs are hidden. It may be necessary for your caregiver to watch this illness with blood tests either before or after diagnosis and treatment. TREATMENT  Low levels of thyroid hormone are increased by using synthetic thyroid hormone. This is a safe, effective treatment. It usually takes about four weeks to gain the full effects of the medication. After you have the full effect of the medication, it will generally take another four weeks for problems to leave. Your caregiver may start you on low doses. If you have had heart problems the dose may be gradually increased. It is generally not an emergency to get rapidly to normal. HOME CARE INSTRUCTIONS   Take your medications as your caregiver suggests. Let your caregiver know of any medications you are taking or start taking. Your caregiver will help you with dosage schedules.  As your condition improves, your dosage needs may increase. It will be necessary to have continuing blood tests as suggested by your caregiver.  Report all suspected medication side effects to your caregiver. SEEK MEDICAL CARE IF: Seek medical care if you develop:  Sweating.  Tremulousness (tremors).  Anxiety.  Rapid weight loss.  Heat intolerance.  Emotional swings.  Diarrhea.  Weakness. SEEK IMMEDIATE MEDICAL CARE IF:  You develop chest pain, an irregular heart beat (palpitations), or a rapid heart beat. MAKE SURE YOU:   Understand these  instructions.  Will watch your condition.  Will get help right away if you are not doing well or get worse. Document Released: 03/15/2005 Document Revised: 06/07/2011 Document Reviewed: 11/03/2007 [facility] Patient Information 2014 [place], [place].  Hypertension As your heart beats, it forces blood through your arteries. This force is your blood pressure. If the pressure is too high, it is called hypertension (HTN) or high blood pressure. HTN is dangerous because you may have it and not know it. High blood pressure may mean that your heart has to work harder to pump blood. Your arteries may be narrow or stiff. The extra work puts you at risk for heart disease, stroke, and other problems.  Blood pressure consists of two numbers, a higher number over a lower, 110/72, for example. It is stated as "110 over 72." The ideal is below 120 for the top number (systolic) and under 80 for the bottom (diastolic). Write down your blood pressure today. You should pay close attention to your blood pressure if  you have certain conditions such as:  Heart failure.  Prior heart attack.  Diabetes  Chronic kidney disease.  Prior stroke.  Multiple risk factors for heart disease. To see if you have HTN, your blood pressure should be measured while you are seated with your arm held at the level of the heart. It should be measured at least twice. A one-time elevated blood pressure reading (especially in the Emergency Department) does not mean that you need treatment. There may be conditions in which the blood pressure is different between your right and left arms. It is important to see your caregiver soon for a recheck. Most people have essential hypertension which means that there is not a specific cause. This type of high blood pressure may be lowered by changing lifestyle factors such as:  Stress.  Smoking.  Lack of exercise.  Excessive weight.  Drug/tobacco/alcohol use.  Eating less salt. Most people  do not have symptoms from high blood pressure until it has caused damage to the body. Effective treatment can often prevent, delay or reduce that damage. TREATMENT  When a cause has been identified, treatment for high blood pressure is directed at the cause. There are a large number of medications to treat HTN. These fall into several categories, and your caregiver will help you select the medicines that are best for you. Medications may have side effects. You should review side effects with your caregiver. If your blood pressure stays high after you have made lifestyle changes or started on medicines,   Your medication(s) may need to be changed.  Other problems may need to be addressed.  Be certain you understand your prescriptions, and know how and when to take your medicine.  Be sure to follow up with your caregiver within the time frame advised (usually within two weeks) to have your blood pressure rechecked and to review your medications.  If you are taking more than one medicine to lower your blood pressure, make sure you know how and at what times they should be taken. Taking two medicines at the same time can result in blood pressure that is too low. SEEK IMMEDIATE MEDICAL CARE IF:  You develop a severe headache, blurred or changing vision, or confusion.  You have unusual weakness or numbness, or a faint feeling.  You have severe chest or abdominal pain, vomiting, or breathing problems. MAKE SURE YOU:   Understand these instructions.  Will watch your condition.  Will get help right away if you are not doing well or get worse. Document Released: 03/15/2005 Document Revised: 06/07/2011 Document Reviewed: 11/03/2007 [facility] Patient Information 2014 [place], [place].  Diabetes and Exercise Exercising regularly is important. It is not just about losing weight. It has many health benefits, such as:  Improving your overall fitness, flexibility, and endurance.  Increasing your  bone density.  Helping with weight control.  Decreasing your body fat.  Increasing your muscle strength.  Reducing stress and tension.  Improving your overall health. People with diabetes who exercise gain additional benefits because exercise:  Reduces appetite.  Improves the body's use of blood sugar (glucose).  Helps lower or control blood glucose.  Decreases blood pressure.  Helps control blood lipids (such as cholesterol and triglycerides).  Improves the body's use of the hormone insulin by:  Increasing the body's insulin sensitivity.  Reducing the body's insulin needs.  Decreases the risk for heart disease because exercising:  Lowers cholesterol and triglycerides levels.  Increases the levels of good cholesterol (such as high-density lipoproteins [  HDL]) in the body.  Lowers blood glucose levels. YOUR ACTIVITY PLAN  Choose an activity that you enjoy and set realistic goals. Your health care provider or diabetes educator can help you make an activity plan that works for you. You can break activities into 2 or 3 sessions throughout the day. Doing so is as good as one long session. Exercise ideas include:  Taking the dog for a walk.  Taking the stairs instead of the elevator.  Dancing to your favorite song.  Doing your favorite exercise with a friend. RECOMMENDATIONS FOR EXERCISING WITH TYPE 1 OR TYPE 2 DIABETES   Check your blood glucose before exercising. If blood glucose levels are greater than 240 mg/dL, check for urine ketones. Do not exercise if ketones are present.  Avoid injecting insulin into areas of the body that are going to be exercised. For example, avoid injecting insulin into:  The arms when playing tennis.  The legs when jogging.  Keep a record of:  Food intake before and after you exercise.  Expected peak times of insulin action.  Blood glucose levels before and after you exercise.  The type and amount of exercise you have  done.  Review your records with your health care provider. Your health care provider will help you to develop guidelines for adjusting food intake and insulin amounts before and after exercising.  If you take insulin or oral hypoglycemic agents, watch for signs and symptoms of hypoglycemia. They include:  Dizziness.  Shaking.  Sweating.  Chills.  Confusion.  Drink plenty of water while you exercise to prevent dehydration or heat stroke. Body water is lost during exercise and must be replaced.  Talk to your health care provider before starting an exercise program to make sure it is safe for you. Remember, almost any type of activity is better than none. Document Released: 06/05/2003 Document Revised: 11/15/2012 Document Reviewed: 08/22/2012 [facility] Patient Information 2014 [place], [place].  Cholesterol Cholesterol is a white, waxy, fat-like protein needed by your body in small amounts. The liver makes all the cholesterol you need. It is carried from the liver by the blood through the blood vessels. Deposits (plaque) may build up on blood vessel walls. This makes the arteries narrower and stiffer. Plaque increases the risk for heart attack and stroke. You cannot feel your cholesterol level even if it is very high. The only way to know is by a blood test to check your lipid (fats) levels. Once you know your cholesterol levels, you should keep a record of the test results. Work with your caregiver to to keep your levels in the desired range. WHAT THE RESULTS MEAN:  Total cholesterol is a rough measure of all the cholesterol in your blood.  LDL is the so-called bad cholesterol. This is the type that deposits cholesterol in the walls of the arteries. You want this level to be low.  HDL is the good cholesterol because it cleans the arteries and carries the LDL away. You want this level to be high.  Triglycerides are fat that the body can either burn for energy or store. High levels are  closely linked to heart disease. DESIRED LEVELS:  Total cholesterol below 200.  LDL below 100 for people at risk, below 70 for very high risk.  HDL above 50 is good, above 60 is best.  Triglycerides below 150. HOW TO LOWER YOUR CHOLESTEROL:  Diet.  Choose fish or white meat chicken and [place], roasted or baked. Limit fatty cuts of red meat,  fried foods, and processed meats, such as sausage and lunch meat.  Eat lots of fresh fruits and vegetables. Choose whole grains, beans, pasta, potatoes and cereals.  Use only small amounts of olive, corn or canola oils. Avoid butter, mayonnaise, shortening or palm kernel oils. Avoid foods with trans-fats.  Use skim/nonfat milk and low-fat/nonfat yogurt and cheeses. Avoid whole milk, cream, ice cream, egg yolks and cheeses. Healthy desserts include angel food cake, ginger snaps, animal crackers, hard candy, popsicles, and low-fat/nonfat frozen yogurt. Avoid pastries, cakes, pies and cookies.  Exercise.  A regular program helps decrease LDL and raises HDL.  Helps with weight control.  Do things that increase your activity level like gardening, walking, or taking the stairs.  Medication.  May be prescribed by your caregiver to help lowering cholesterol and the risk for heart disease.  You may need medicine even if your levels are normal if you have several risk factors. HOME CARE INSTRUCTIONS   Follow your diet and exercise programs as suggested by your caregiver.  Take medications as directed.  Have blood work done when your caregiver feels it is necessary. MAKE SURE YOU:   Understand these instructions.  Will watch your condition.  Will get help right away if you are not doing well or get worse. Document Released: 12/08/2000 Document Revised: 06/07/2011 Document Reviewed: 05/31/2007 [facility] Patient Information 2014 [place], [place].  Vitamin D Deficiency Vitamin D is an important vitamin that your body needs. Having too little  of it in your body is called a deficiency. A very bad deficiency can make your bones soft and can cause a condition called rickets.  Vitamin D is important to your body for different reasons, such as:   It helps your body absorb 2 minerals called calcium and phosphorus.  It helps make your bones healthy.  It may prevent some diseases, such as diabetes and multiple sclerosis.  It helps your muscles and heart. You can get vitamin D in several ways. It is a natural part of some foods. The vitamin is also added to some dairy products and cereals. Some people take vitamin D supplements. Also, your body makes vitamin D when you are in the sun. It changes the sun's rays into a form of the vitamin that your body can use. CAUSES   Not eating enough foods that contain vitamin D.  Not getting enough sunlight.  Having certain digestive system diseases that make it hard to absorb vitamin D. These diseases include Crohn's disease, chronic pancreatitis, and cystic fibrosis.  Having a surgery in which part of the stomach or small intestine is removed.  Being obese. Fat cells pull vitamin D out of your blood. That means that obese people may not have enough vitamin D left in their blood and in other body tissues.  Having chronic kidney or liver disease. RISK FACTORS Risk factors are things that make you more likely to develop a vitamin D deficiency. They include:  Being older.  Not being able to get outside very much.  Living in a nursing home.  Having had broken bones.  Having weak or thin bones (osteoporosis).  Having a disease or condition that changes how your body absorbs vitamin D.  Having dark skin.  Some medicines such as seizure medicines or steroids.  Being overweight or obese. SYMPTOMS Mild cases of vitamin D deficiency may not have any symptoms. If you have a very bad case, symptoms may include:  Bone pain.  Muscle pain.  Falling often.  Broken  bones caused by a minor  injury, due to osteoporosis. DIAGNOSIS A blood test is the best way to tell if you have a vitamin D deficiency. TREATMENT Vitamin D deficiency can be treated in different ways. Treatment for vitamin D deficiency depends on what is causing it. Options include:  Taking vitamin D supplements.  Taking a calcium supplement. Your caregiver will suggest what dose is best for you. HOME CARE INSTRUCTIONS  Take any supplements that your caregiver prescribes. Follow the directions carefully. Take only the suggested amount.  Have your blood tested 2 months after you start taking supplements.  Eat foods that contain vitamin D. Healthy choices include:  Fortified dairy products, cereals, or juices. Fortified means vitamin D has been added to the food. Check the label on the package to be sure.  Fatty fish like salmon or trout.  Eggs.  Oysters.  Do not use a tanning bed.  Keep your weight at a healthy level. Lose weight if you need to.  Keep all follow-up appointments. Your caregiver will need to perform blood tests to make sure your vitamin D deficiency is going away. SEEK MEDICAL CARE IF:  You have any questions about your treatment.  You continue to have symptoms of vitamin D deficiency.  You have nausea or vomiting.  You are constipated.  You feel confused.  You have severe abdominal or back pain. MAKE SURE YOU:  Understand these instructions.  Will watch your condition.  Will get help right away if you are not doing well or get worse. Document Released: 06/07/2011 Document Revised: 07/10/2012 Document Reviewed: 06/07/2011 [facility] Patient Information 2014 [place], [place].

## 2013-04-05 LAB — MICROALBUMIN / CREATININE URINE RATIO
Creatinine, Urine: 66.6 mg/dL
Microalb Creat Ratio: 7.5 mg/g (ref 0.0–30.0)
Microalb, Ur: 0.5 mg/dL (ref 0.00–1.89)

## 2013-04-05 LAB — HEPATITIS C ANTIBODY: HCV Ab: NEGATIVE

## 2013-04-05 LAB — HEPATIC FUNCTION PANEL
ALT: 35 U/L (ref 0–35)
AST: 25 U/L (ref 0–37)
Albumin: 4.9 g/dL (ref 3.5–5.2)
Alkaline Phosphatase: 61 U/L (ref 39–117)
Bilirubin, Direct: 0.1 mg/dL (ref 0.0–0.3)
Indirect Bilirubin: 0.5 mg/dL (ref 0.0–0.9)
Total Bilirubin: 0.6 mg/dL (ref 0.3–1.2)
Total Protein: 7.6 g/dL (ref 6.0–8.3)

## 2013-04-05 LAB — URINALYSIS, MICROSCOPIC ONLY
Bacteria, UA: NONE SEEN
Casts: NONE SEEN
Crystals: NONE SEEN
Squamous Epithelial / LPF: NONE SEEN

## 2013-04-05 LAB — BASIC METABOLIC PANEL WITH GFR
BUN: 17 mg/dL (ref 6–23)
CO2: 26 mEq/L (ref 19–32)
Calcium: 9.8 mg/dL (ref 8.4–10.5)
Chloride: 106 mEq/L (ref 96–112)
Creat: 1.14 mg/dL — ABNORMAL HIGH (ref 0.50–1.10)
GFR, Est African American: 63 mL/min
GFR, Est Non African American: 55 mL/min — ABNORMAL LOW
Glucose, Bld: 77 mg/dL (ref 70–99)
Potassium: 3.9 mEq/L (ref 3.5–5.3)
Sodium: 140 mEq/L (ref 135–145)

## 2013-04-05 LAB — LIPID PANEL
Cholesterol: 153 mg/dL (ref 0–200)
HDL: 65 mg/dL (ref >39)
LDL Cholesterol: 74 mg/dL (ref 0–99)
Total CHOL/HDL Ratio: 2.4 Ratio
Triglycerides: 72 mg/dL (ref <150)
VLDL: 14 mg/dL (ref 0–40)

## 2013-04-05 LAB — HIV ANTIBODY (ROUTINE TESTING W REFLEX): HIV: NONREACTIVE

## 2013-04-05 LAB — HEMOGLOBIN A1C
Hgb A1c MFr Bld: 5.6 % (ref <5.7)
Mean Plasma Glucose: 114 mg/dL (ref <117)

## 2013-04-05 LAB — HEPATITIS B CORE ANTIBODY, TOTAL: Hep B Core Total Ab: NONREACTIVE

## 2013-04-05 LAB — MAGNESIUM: Magnesium: 2 mg/dL (ref 1.5–2.5)

## 2013-04-05 LAB — RPR

## 2013-04-05 LAB — VITAMIN D 25 HYDROXY (VIT D DEFICIENCY, FRACTURES): Vit D, 25-Hydroxy: 101 ng/mL — ABNORMAL HIGH (ref 30–89)

## 2013-04-05 LAB — INSULIN, FASTING: Insulin fasting, serum: 15 u[IU]/mL (ref 3–28)

## 2013-04-05 LAB — HEPATITIS B E ANTIBODY: Hepatitis Be Antibody: NEGATIVE

## 2013-04-05 LAB — HEPATITIS A ANTIBODY, TOTAL: Hep A Total Ab: NONREACTIVE

## 2013-04-05 LAB — TSH: TSH: 0.545 u[IU]/mL (ref 0.350–4.500)

## 2013-04-05 LAB — HEPATITIS B SURFACE ANTIBODY,QUALITATIVE: Hep B S Ab: NEGATIVE

## 2013-04-05 LAB — VITAMIN B12: Vitamin B-12: 484 pg/mL (ref 211–911)

## 2013-04-06 LAB — TB SKIN TEST
Induration: 0 mm
TB Skin Test: NEGATIVE

## 2013-04-27 ENCOUNTER — Other Ambulatory Visit (INDEPENDENT_AMBULATORY_CARE_PROVIDER_SITE_OTHER): Payer: BC Managed Care – PPO | Admitting: *Deleted

## 2013-04-27 DIAGNOSIS — Z1212 Encounter for screening for malignant neoplasm of rectum: Secondary | ICD-10-CM

## 2013-04-27 LAB — POC HEMOCCULT BLD/STL (HOME/3-CARD/SCREEN)
Card #2 Fecal Occult Blod, POC: NEGATIVE
Card #3 Fecal Occult Blood, POC: NEGATIVE
Fecal Occult Blood, POC: NEGATIVE

## 2013-06-10 ENCOUNTER — Other Ambulatory Visit: Payer: Self-pay | Admitting: Physician Assistant

## 2013-06-13 ENCOUNTER — Encounter: Payer: Self-pay | Admitting: Neurology

## 2013-06-13 ENCOUNTER — Ambulatory Visit (INDEPENDENT_AMBULATORY_CARE_PROVIDER_SITE_OTHER): Payer: BC Managed Care – PPO | Admitting: Neurology

## 2013-06-13 VITALS — BP 117/75 | HR 87 | Ht 65.5 in | Wt 158.0 lb

## 2013-06-13 DIAGNOSIS — G8114 Spastic hemiplegia affecting left nondominant side: Secondary | ICD-10-CM

## 2013-06-13 DIAGNOSIS — G8110 Spastic hemiplegia affecting unspecified side: Secondary | ICD-10-CM

## 2013-06-14 MED ORDER — ONABOTULINUMTOXINA 100 UNITS IJ SOLR
300.0000 [IU] | Freq: Once | INTRAMUSCULAR | Status: AC
  Administered 2013-06-14: 300 [IU] via INTRAMUSCULAR

## 2013-06-14 NOTE — Progress Notes (Signed)
History of Present Illness: [name] is here for EMG guided BOTOX injection.  She has history of right internal carotid artery dissection following an episode of vomiting from migraine headache in mid June 2009. She was given IV TPA and found to have right MCA occlusion. She underwent emergent right carotid artery stent with distal endovascular recanalization of the middle cerebral artery.  She had small hemorrhagic transformation of the right basal ganglia with mild left hemiparesis, but has done well since then and has been independent.  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  She has made marked recovery, ambulating only with very mild difficulty,  maintain majority of her left arm function,  there is mild limitation in the range of motion in her left shoulder,  mild left shoulder stiffness and pain, most bothersome symptoms is her left elbow discomfort, left wrist achy pain, difficulty releasing left hand flexion, left arm persistent flexion,  left ankle plantar inversion, increased gait difficulty after prolonged walking, This has all been helped by Botox injection.  She has been receiving BOTOX injection since 01/2009 q3 months, to her left upper extremity and also left lower extremity, responded well.  Repeat US carotid were normal in October 2013  UPDATE March 19th 2015:  Last EMG guided injection was in Dec 2014, she responded very well, she received total of 300 units, there was no significant side effect  Physical Exam  General: Pleasant Caucasian lady , in no distress.  Afebrile.   Head: nontraumatic Musculoskeletal: no deformity Skin: no rash, petechiae  Neurologic Exam  Mental Status: pleasant, awake, alert, cooperative to history, talking, and casual conversation. Cranial Nerves: CN II-XII pupils were equal round reactive to light.   Extraocular movements were full.  Visual fields were full on confrontational test.  slight shallow left nasolabial fold.  Hearing was  intact to finger rubbing bilaterally.  Uvula tongue were midline.  Head turning and shoulder shrugging were normal and symmetric.  Tongue protrusion into the cheeks strength were normal.  Motor: mild spastic hemiparesis of left arm, tends to stay at left elbow flexion, pronation, mild left shoulder droop, decreased arm swing when ambulating. Mild left finger flexion, weak grip 4, left thumb tends to stay in flexed position.  Left ankle slight limited range of motion, tends to stay in left ankle plantar inversion. Sensory: Normal to light touch, pinprick, proprioception, and vibratory sensation. Coordination: There was no dysmetria noticed. Gait and Station: Narrow based and steady, mild left ankle plantar inversion, decreased left arm swing, slight left shoulder droop Reflexes: Deep tendon reflexes are 2+, hyper reflexia on left side  Plantars are downgoing.     Assessment and Plan:  Spastic left hemiparesis, due to right MCA stroke in June 2009, right internal carotid artery dissection, s/p right IC stent.   under EMG guidance 300units of botox (Lot No.  [id number]  Exp March 2017), 100 units dissolved in 2cc of NS.  Left brachialis 25 Left biceps 25  Left flexor digitorum profundi 12.5 Left pronator 25 Left flexor digitorum superficialis 12.5  Left flexor carpi ulnaris 25  Left pectoralis major 25 units Left teres major 25 units Left latissimus dorsi 25   Left flexor digitorum longus 50 units Left tibialis posterior 50 units  She will return in 3 months for repeat injection,

## 2013-06-18 ENCOUNTER — Encounter: Payer: Self-pay | Admitting: Physician Assistant

## 2013-06-18 ENCOUNTER — Ambulatory Visit (INDEPENDENT_AMBULATORY_CARE_PROVIDER_SITE_OTHER): Payer: BC Managed Care – PPO | Admitting: Physician Assistant

## 2013-06-18 VITALS — BP 112/68 | HR 88 | Temp 97.7°F | Resp 16 | Ht 68.0 in | Wt 158.0 lb

## 2013-06-18 DIAGNOSIS — R51 Headache: Secondary | ICD-10-CM

## 2013-06-18 DIAGNOSIS — G43909 Migraine, unspecified, not intractable, without status migrainosus: Secondary | ICD-10-CM

## 2013-06-18 NOTE — Patient Instructions (Signed)
Trigeminal Neuralgia Trigeminal neuralgia is a nerve disorder that causes sudden attacks of severe facial pain. It is caused by damage to the trigeminal nerve, a major nerve in the face. It is more common in women and in the elderly, although it can also happen in younger patients. Attacks last from a few seconds to several minutes and can occur from a couple of times per year to several times per day. Trigeminal neuralgia can be a very distressing and disabling condition. Surgery may be needed in very severe cases if medical treatment does not give relief. HOME CARE INSTRUCTIONS   If your caregiver prescribed medication to help prevent attacks, take as directed.  To help prevent attacks:  Chew on the unaffected side of the mouth.  Avoid touching your face.  Avoid blasts of hot or cold air.  Men may wish to grow a beard to avoid having to shave. SEEK IMMEDIATE MEDICAL CARE IF:  Pain is unbearable and your medicine does not help.  You develop new, unexplained symptoms (problems).  You have problems that may be related to a medication you are taking. Document Released: 03/12/2000 Document Revised: 06/07/2011 Document Reviewed: 01/10/2009 [employer] Patient Information 2014 [employer].  

## 2013-06-18 NOTE — Progress Notes (Signed)
   Subjective:    Patient ID: Ruth Gregory, female    DOB: 12-20-1960, 53 y.o.   MRN: [id number]  HPI 53 y.o. female with history of stoke in 2009, and shingles presents for possible shingles. She states she has had shingles on her right leg and right face before. This past week, Wednesday, Thursday and friday she had a very bad headache, and felt along hair line burning/sore sensation. Denies changes in vision, speech, weakness   Review of Systems  Respiratory: Negative.   Cardiovascular: Negative.   Gastrointestinal: Negative.   Genitourinary: Negative.   Musculoskeletal: Negative.   Neurological: Positive for numbness and headaches. Negative for dizziness, tremors, seizures, syncope, facial asymmetry, speech difficulty, weakness and light-headedness.       Objective:   Physical Exam  Constitutional: She is oriented to person, place, and time. She appears well-developed and well-nourished.  HENT:  Head: Normocephalic and atraumatic.  Right Ear: External ear normal.  Left Ear: External ear normal.  Eyes: Conjunctivae and EOM are normal. Pupils are equal, round, and reactive to light.  Neck: Normal range of motion. Neck supple.  Cardiovascular: Normal rate and regular rhythm.   Pulmonary/Chest: Effort normal and breath sounds normal.  Abdominal: Soft. Bowel sounds are normal.  Musculoskeletal: Normal range of motion.  Lymphadenopathy:    She has no cervical adenopathy.  Neurological: She is alert and oriented to person, place, and time. She has normal reflexes. No cranial nerve deficit.  Skin: Skin is warm and dry.  On center of chest, erythematous scaly 0.5x 0.6 inch area getting larger      Assessment & Plan:  Right sided headache, likely not shingles, ? TMJ, vs migraine, vs trigeminal neuralgia- check ESR, declines lyrica, continue topamax Shingles- check titer Area on chest- schedule appointment for removal

## 2013-06-19 LAB — SEDIMENTATION RATE: Sed Rate: 1 mm/hr (ref 0–22)

## 2013-06-19 LAB — VARICELLA ZOSTER ANTIBODY, IGG: Varicella IgG: 1354 Index — ABNORMAL HIGH (ref <135.00)

## 2013-06-28 ENCOUNTER — Other Ambulatory Visit: Payer: Self-pay | Admitting: Internal Medicine

## 2013-07-02 ENCOUNTER — Telehealth: Payer: Self-pay | Admitting: Neurology

## 2013-07-02 NOTE — Telephone Encounter (Signed)
Patient f/u on paperwork she left here to be filled out--please call.

## 2013-07-02 NOTE — Telephone Encounter (Signed)
Patient said that she brought paperwork in about 2 wks ago, paid a fee, wants to check the status and will pick up when ready, does not want it mailed.

## 2013-07-04 ENCOUNTER — Ambulatory Visit: Payer: Self-pay | Admitting: Physician Assistant

## 2013-07-13 ENCOUNTER — Encounter: Payer: Self-pay | Admitting: Physician Assistant

## 2013-07-13 ENCOUNTER — Ambulatory Visit (INDEPENDENT_AMBULATORY_CARE_PROVIDER_SITE_OTHER): Payer: BC Managed Care – PPO | Admitting: Physician Assistant

## 2013-07-13 VITALS — BP 120/80 | HR 84 | Temp 98.0°F | Resp 16 | Wt 161.0 lb

## 2013-07-13 DIAGNOSIS — E039 Hypothyroidism, unspecified: Secondary | ICD-10-CM

## 2013-07-13 DIAGNOSIS — I10 Essential (primary) hypertension: Secondary | ICD-10-CM

## 2013-07-13 LAB — BASIC METABOLIC PANEL WITH GFR
BUN: 16 mg/dL (ref 6–23)
CO2: 23 mEq/L (ref 19–32)
Calcium: 9.6 mg/dL (ref 8.4–10.5)
Chloride: 107 mEq/L (ref 96–112)
Creat: 1.13 mg/dL — ABNORMAL HIGH (ref 0.50–1.10)
GFR, Est African American: 64 mL/min
GFR, Est Non African American: 55 mL/min — ABNORMAL LOW
Glucose, Bld: 90 mg/dL (ref 70–99)
Potassium: 4.2 mEq/L (ref 3.5–5.3)
Sodium: 141 mEq/L (ref 135–145)

## 2013-07-13 LAB — HEPATIC FUNCTION PANEL
ALT: 54 U/L — ABNORMAL HIGH (ref 0–35)
AST: 31 U/L (ref 0–37)
Albumin: 4.5 g/dL (ref 3.5–5.2)
Alkaline Phosphatase: 66 U/L (ref 39–117)
Bilirubin, Direct: 0.1 mg/dL (ref 0.0–0.3)
Indirect Bilirubin: 0.5 mg/dL (ref 0.2–1.2)
Total Bilirubin: 0.6 mg/dL (ref 0.2–1.2)
Total Protein: 7.1 g/dL (ref 6.0–8.3)

## 2013-07-13 LAB — CBC WITH DIFFERENTIAL/PLATELET
Basophils Absolute: 0 10*3/uL (ref 0.0–0.1)
Basophils Relative: 0 % (ref 0–1)
Eosinophils Absolute: 0.1 10*3/uL (ref 0.0–0.7)
Eosinophils Relative: 3 % (ref 0–5)
HCT: 41.8 % (ref 36.0–46.0)
Hemoglobin: 14.5 g/dL (ref 12.0–15.0)
Lymphocytes Relative: 40 % (ref 12–46)
Lymphs Abs: 1.4 10*3/uL (ref 0.7–4.0)
MCH: 31 pg (ref 26.0–34.0)
MCHC: 34.7 g/dL (ref 30.0–36.0)
MCV: 89.3 fL (ref 78.0–100.0)
Monocytes Absolute: 0.3 10*3/uL (ref 0.1–1.0)
Monocytes Relative: 9 % (ref 3–12)
Neutro Abs: 1.7 10*3/uL (ref 1.7–7.7)
Neutrophils Relative %: 48 % (ref 43–77)
Platelets: 223 10*3/uL (ref 150–400)
RBC: 4.68 MIL/uL (ref 3.87–5.11)
RDW: 13.6 % (ref 11.5–15.5)
WBC: 3.6 10*3/uL — ABNORMAL LOW (ref 4.0–10.5)

## 2013-07-13 MED ORDER — AMPHETAMINE-DEXTROAMPHETAMINE 10 MG PO TABS: 10.0000 mg | ORAL_TABLET | Freq: Two times a day (BID) | ORAL | Status: DC

## 2013-07-13 MED ORDER — DICLOFENAC 35 MG PO CAPS: ORAL_CAPSULE | ORAL | Status: DC

## 2013-07-13 NOTE — Patient Instructions (Signed)

## 2013-07-13 NOTE — Progress Notes (Signed)
   Subjective:    Patient ID: Ruth Gregory, female    DOB: 11/26/1960, 53 y.o.   MRN: [id number]  HPI 53 y.o. female presents for follow up.  Patient has been followed for Migraine and Rt Brain CVA attributed to Rt Carotid Artery dissection with consequent mild Lt spastic hemi-paresis, she sees Dr. Terrace ArabiaYan and is in study currently for pain and is getting botox injection in her left hand and left leg. Currently she is complaining of left leg pain more so than her left hand. The botox is helps significantly with her pain. She is not on a regular antiinflammatory and only takes aleve PRN. She states that she has a very difficult time concentrating and after preforming a test about focus, it is possible that she has had ADD for a long time. Her son was also diagnosed and especially with her stroke and possible ADD diagnosis we are going to try a very low dose of adderall to take as needed.   She states that she is gaining weight, she is taking 1 pill and 1/2 pill every other day and she would like this tested. She states she has hot flashes during the day and a night from menopause, she is on estring for vaginal dryness.     Review of Systems  Respiratory: Negative.   Cardiovascular: Negative.   Gastrointestinal: Negative.   Genitourinary: Negative.   Musculoskeletal: Positive for arthralgias, gait problem and myalgias.  Skin: Negative.   Neurological: Positive for facial asymmetry, speech difficulty, weakness, numbness and headaches.  Psychiatric/Behavioral: Positive for decreased concentration. Negative for suicidal ideas, sleep disturbance and self-injury. The patient is nervous/anxious.        Objective:   Physical Exam General Appearance: Well nourished, in no apparent distress. Eyes: PERRLA, EOMs, conjunctiva no swelling or erythema, normal fundi and vessels. Sinuses: No frontal/maxillary tenderness ENT/Mouth: EACs patent / TMs  nl. Nares clear without erythema, swelling, mucoid exudates. Oral  hygiene is good. No erythema, swelling, or exudate. Tongue normal, non-obstructing. Tonsils not swollen or erythematous. Hearing normal.  Neck: Supple, thyroid normal. No bruits, nodes or JVD. Respiratory: Respiratory effort normal.  BS equal and clear bilateral without rales, rhonci, wheezing or stridor. Cardio: Heart sounds are normal with regular rate and rhythm and no murmurs, rubs or gallops. Peripheral pulses are normal and equal bilaterally without edema. No aortic or femoral bruits. Chest: symmetric with normal excursions and percussion. Breasts: Symmetric, without lumps, nipple discharge, retractions, or fibrocystic changes.  Abdomen: Flat, soft, with bowl sounds. Nontender, no guarding, rebound, hernias, masses, or organomegaly.  Lymphatics: Non tender without lymphadenopathy.  Musculoskeletal:  Mild spastic Lt HP and flexion type contracturing of the Lt wrist and hand. Otherwise full ROM of the Rt extremities, with normal joint stability, strength on the Rt  and  gait is normal . Skin: Warm and dry without rashes, lesions, cyanosis, clubbing or  ecchymosis.  Neuro: Cranial nerves intact, reflexes slightly increased on the Left and muscle tone likewise sl. Increased on the Left. No cerebellar symptoms. Sensation intact.  Pysch: Alert and oriented X 3, normal affect.      Assessment & Plan:   Migraines- cont magnesium, stress reduction, continue to follow Dr. Terrace ArabiaYan Arm/leg pain- she is hesistant to be on full dose NSAID, will try new zovirax ADD/trouble focus- Adderall 10mg  1/2-1 pill BID PRN.

## 2013-07-14 LAB — TSH: TSH: 1.899 u[IU]/mL (ref 0.350–4.500)

## 2013-07-16 ENCOUNTER — Ambulatory Visit: Payer: Self-pay | Admitting: Physician Assistant

## 2013-07-25 ENCOUNTER — Emergency Department (HOSPITAL_BASED_OUTPATIENT_CLINIC_OR_DEPARTMENT_OTHER)
Admission: EM | Admit: 2013-07-25 | Discharge: 2013-07-25 | Disposition: A | Discharge status: Home / Self Care | Payer: BC Managed Care – PPO | Attending: Emergency Medicine | Admitting: Emergency Medicine

## 2013-07-25 ENCOUNTER — Encounter (HOSPITAL_BASED_OUTPATIENT_CLINIC_OR_DEPARTMENT_OTHER): Payer: Self-pay | Admitting: Emergency Medicine

## 2013-07-25 ENCOUNTER — Encounter: Payer: Self-pay | Admitting: Emergency Medicine

## 2013-07-25 ENCOUNTER — Ambulatory Visit (INDEPENDENT_AMBULATORY_CARE_PROVIDER_SITE_OTHER): Payer: BC Managed Care – PPO | Admitting: Emergency Medicine

## 2013-07-25 VITALS — BP 108/66 | HR 120 | Temp 99.2°F | Resp 18 | Ht 68.25 in | Wt 158.0 lb

## 2013-07-25 DIAGNOSIS — Z7982 Long term (current) use of aspirin: Secondary | ICD-10-CM | POA: Insufficient documentation

## 2013-07-25 DIAGNOSIS — R509 Fever, unspecified: Secondary | ICD-10-CM

## 2013-07-25 DIAGNOSIS — R110 Nausea: Secondary | ICD-10-CM | POA: Insufficient documentation

## 2013-07-25 DIAGNOSIS — Z8673 Personal history of transient ischemic attack (TIA), and cerebral infarction without residual deficits: Secondary | ICD-10-CM | POA: Insufficient documentation

## 2013-07-25 DIAGNOSIS — F3289 Other specified depressive episodes: Secondary | ICD-10-CM | POA: Insufficient documentation

## 2013-07-25 DIAGNOSIS — M62838 Other muscle spasm: Secondary | ICD-10-CM | POA: Insufficient documentation

## 2013-07-25 DIAGNOSIS — R109 Unspecified abdominal pain: Secondary | ICD-10-CM | POA: Insufficient documentation

## 2013-07-25 DIAGNOSIS — R197 Diarrhea, unspecified: Secondary | ICD-10-CM | POA: Insufficient documentation

## 2013-07-25 DIAGNOSIS — Z9089 Acquired absence of other organs: Secondary | ICD-10-CM | POA: Insufficient documentation

## 2013-07-25 DIAGNOSIS — F329 Major depressive disorder, single episode, unspecified: Secondary | ICD-10-CM | POA: Insufficient documentation

## 2013-07-25 DIAGNOSIS — E039 Hypothyroidism, unspecified: Secondary | ICD-10-CM | POA: Insufficient documentation

## 2013-07-25 DIAGNOSIS — R5381 Other malaise: Secondary | ICD-10-CM

## 2013-07-25 DIAGNOSIS — R5383 Other fatigue: Secondary | ICD-10-CM

## 2013-07-25 DIAGNOSIS — G43909 Migraine, unspecified, not intractable, without status migrainosus: Secondary | ICD-10-CM | POA: Insufficient documentation

## 2013-07-25 DIAGNOSIS — F411 Generalized anxiety disorder: Secondary | ICD-10-CM | POA: Insufficient documentation

## 2013-07-25 DIAGNOSIS — E559 Vitamin D deficiency, unspecified: Secondary | ICD-10-CM | POA: Insufficient documentation

## 2013-07-25 DIAGNOSIS — E860 Dehydration: Secondary | ICD-10-CM | POA: Insufficient documentation

## 2013-07-25 DIAGNOSIS — IMO0001 Reserved for inherently not codable concepts without codable children: Secondary | ICD-10-CM | POA: Insufficient documentation

## 2013-07-25 DIAGNOSIS — Z880 Allergy status to penicillin: Secondary | ICD-10-CM | POA: Insufficient documentation

## 2013-07-25 DIAGNOSIS — J45909 Unspecified asthma, uncomplicated: Secondary | ICD-10-CM | POA: Insufficient documentation

## 2013-07-25 DIAGNOSIS — E7800 Pure hypercholesterolemia, unspecified: Secondary | ICD-10-CM | POA: Insufficient documentation

## 2013-07-25 DIAGNOSIS — Z862 Personal history of diseases of the blood and blood-forming organs and certain disorders involving the immune mechanism: Secondary | ICD-10-CM | POA: Insufficient documentation

## 2013-07-25 DIAGNOSIS — Z79899 Other long term (current) drug therapy: Secondary | ICD-10-CM | POA: Insufficient documentation

## 2013-07-25 DIAGNOSIS — I10 Essential (primary) hypertension: Secondary | ICD-10-CM | POA: Insufficient documentation

## 2013-07-25 LAB — CBC WITH DIFFERENTIAL/PLATELET
Basophils Absolute: 0 10*3/uL (ref 0.0–0.1)
Basophils Relative: 0 % (ref 0–1)
Eosinophils Absolute: 0 10*3/uL (ref 0.0–0.7)
Eosinophils Relative: 0 % (ref 0–5)
HCT: 40 % (ref 36.0–46.0)
Hemoglobin: 13.9 g/dL (ref 12.0–15.0)
Lymphocytes Relative: 10 % — ABNORMAL LOW (ref 12–46)
Lymphs Abs: 0.9 10*3/uL (ref 0.7–4.0)
MCH: 32.4 pg (ref 26.0–34.0)
MCHC: 34.8 g/dL (ref 30.0–36.0)
MCV: 93.2 fL (ref 78.0–100.0)
Monocytes Absolute: 0.8 10*3/uL (ref 0.1–1.0)
Monocytes Relative: 9 % (ref 3–12)
Neutro Abs: 7 10*3/uL (ref 1.7–7.7)
Neutrophils Relative %: 81 % — ABNORMAL HIGH (ref 43–77)
Platelets: 151 10*3/uL (ref 150–400)
RBC: 4.29 MIL/uL (ref 3.87–5.11)
RDW: 12.5 % (ref 11.5–15.5)
WBC: 8.7 10*3/uL (ref 4.0–10.5)

## 2013-07-25 LAB — COMPREHENSIVE METABOLIC PANEL
ALT: 30 U/L (ref 0–35)
AST: 21 U/L (ref 0–37)
Albumin: 3.9 g/dL (ref 3.5–5.2)
Alkaline Phosphatase: 80 U/L (ref 39–117)
BUN: 21 mg/dL (ref 6–23)
CO2: 23 mEq/L (ref 19–32)
Calcium: 9.6 mg/dL (ref 8.4–10.5)
Chloride: 101 mEq/L (ref 96–112)
Creatinine, Ser: 1.3 mg/dL — ABNORMAL HIGH (ref 0.50–1.10)
GFR calc Af Amer: 53 mL/min — ABNORMAL LOW (ref >90)
GFR calc non Af Amer: 46 mL/min — ABNORMAL LOW (ref >90)
Glucose, Bld: 115 mg/dL — ABNORMAL HIGH (ref 70–99)
Potassium: 4.3 mEq/L (ref 3.7–5.3)
Sodium: 138 mEq/L (ref 137–147)
Total Bilirubin: 0.6 mg/dL (ref 0.3–1.2)
Total Protein: 7 g/dL (ref 6.0–8.3)

## 2013-07-25 LAB — URINALYSIS, ROUTINE W REFLEX MICROSCOPIC
Bilirubin Urine: NEGATIVE
Glucose, UA: NEGATIVE mg/dL
Hgb urine dipstick: NEGATIVE
Ketones, ur: NEGATIVE mg/dL
Leukocytes, UA: NEGATIVE
Nitrite: NEGATIVE
Protein, ur: NEGATIVE mg/dL
Specific Gravity, Urine: 1.005 (ref 1.005–1.030)
Urobilinogen, UA: 0.2 mg/dL (ref 0.0–1.0)
pH: 6 (ref 5.0–8.0)

## 2013-07-25 MED ORDER — SODIUM CHLORIDE 0.9 % IV BOLUS (SEPSIS)
1000.0000 mL | Freq: Once | INTRAVENOUS | Status: AC
  Administered 2013-07-25: 1000 mL via INTRAVENOUS

## 2013-07-25 MED ORDER — ONDANSETRON HCL 4 MG/2ML IJ SOLN
4.0000 mg | Freq: Once | INTRAMUSCULAR | Status: AC
  Administered 2013-07-25: 4 mg via INTRAVENOUS

## 2013-07-25 MED ORDER — KETOROLAC TROMETHAMINE 30 MG/ML IJ SOLN
30.0000 mg | Freq: Once | INTRAMUSCULAR | Status: AC
  Administered 2013-07-25: 30 mg via INTRAVENOUS

## 2013-07-25 MED ORDER — ONDANSETRON 8 MG PO TBDP: ORAL_TABLET | ORAL | Status: DC

## 2013-07-25 MED ORDER — KETOROLAC TROMETHAMINE 30 MG/ML IJ SOLN
INTRAMUSCULAR | Status: AC
  Administered 2013-07-25: 30 mg via INTRAVENOUS
  Filled 2013-07-25: qty 1

## 2013-07-25 MED ORDER — ONDANSETRON HCL 4 MG/2ML IJ SOLN
INTRAMUSCULAR | Status: AC
  Administered 2013-07-25: 4 mg via INTRAVENOUS
  Filled 2013-07-25: qty 2

## 2013-07-25 NOTE — Patient Instructions (Signed)
Dehydration, Adult Dehydration means your body does not have as much fluid as it needs. Your kidneys, brain, and heart will not work properly without the right amount of fluids and salt.  HOME CARE  Ask your doctor how to replace body fluid losses (rehydrate).  Drink enough fluids to keep your pee (urine) clear or pale yellow.  Drink small amounts of fluids often if you feel sick to your stomach (nauseous) or throw up (vomit).  Eat like you normally do.  Avoid:  Foods or drinks high in sugar.  Bubbly (carbonated) drinks.  Juice.  Very hot or cold fluids.  Drinks with caffeine.  Fatty, greasy foods.  Alcohol.  Tobacco.  Eating too much.  Gelatin desserts.  Wash your hands to avoid spreading germs (bacteria, viruses).  Only take medicine as told by your doctor.  Keep all doctor visits as told. GET HELP RIGHT AWAY IF:   You cannot drink something without throwing up.  You get worse even with treatment.  Your vomit has blood in it or looks greenish.  Your poop (stool) has blood in it or looks black and tarry.  You have not peed in 6 to 8 hours.  You pee a small amount of very dark pee.  You have a fever.  You pass out (faint).  You have belly (abdominal) pain that gets worse or stays in one spot (localizes).  You have a rash, stiff neck, or bad headache.  You get easily annoyed, sleepy, or are hard to wake up.  You feel weak, dizzy, or very thirsty. MAKE SURE YOU:   Understand these instructions.  Will watch your condition.  Will get help right away if you are not doing well or get worse. Document Released: 01/09/2009 Document Revised: 06/07/2011 Document Reviewed: 11/02/2010 [employer] Patient Information 2014 [employer]. Diarrhea Diarrhea is watery poop (stool). It can make you feel weak, tired, thirsty, or give you a dry mouth (signs of dehydration). Watery poop is a sign of another problem, most often an infection. It often lasts 2 3 days.  It can last longer if it is a sign of something serious. Take care of yourself as told by your doctor. HOME CARE   Drink 1 cup (8 ounces) of fluid each time you have watery poop.  Do not drink the following fluids:  Those that contain simple sugars (fructose, glucose, galactose, lactose, sucrose, maltose).  Sports drinks.  Fruit juices.  Whole milk products.  Sodas.  Drinks with caffeine (coffee, tea, soda) or alcohol.  Oral rehydration solution may be used if the doctor says it is okay. You may make your own solution. Follow this recipe:    teaspoon table salt.   teaspoon baking soda.   teaspoon salt substitute containing potassium chloride.  1 tablespoons sugar.  1 liter (34 ounces) of water.  Avoid the following foods:  High fiber foods, such as raw fruits and vegetables.  Nuts, seeds, and whole grain breads and cereals.   Those that are sweetened with sugar alcohols (xylitol, sorbitol, mannitol).  Try eating the following foods:  Starchy foods, such as rice, toast, pasta, low-sugar cereal, oatmeal, baked potatoes, crackers, and bagels.  Bananas.  Applesauce.  Eat probiotic-rich foods, such as yogurt and milk products that are fermented.  Wash your hands well after each time you have watery poop.  Only take medicine as told by your doctor.  Take a warm bath to help lessen burning or pain from having watery poop. GET HELP RIGHT AWAY   IF:   You cannot drink fluids without throwing up (vomiting).  You keep throwing up.  You have blood in your poop, or your poop looks black and tarry.  You do not pee (urinate) in 6 8 hours, or there is only a small amount of very dark pee.  You have belly (abdominal) pain that gets worse or stays in the same spot (localizes).  You are weak, dizzy, confused, or lightheaded.  You have a very bad headache.  Your watery poop gets worse or does not get better.  You have a fever or lasting symptoms for more than 2 3  days.  You have a fever and your symptoms suddenly get worse. MAKE SURE YOU:   Understand these instructions.  Will watch your condition.  Will get help right away if you are not doing well or get worse. Document Released: 09/01/2007 Document Revised: 12/08/2011 Document Reviewed: 11/21/2011 [employer] Patient Information 2014 [employer].  

## 2013-07-25 NOTE — Progress Notes (Signed)
Subjective:    Patient ID: Ruth Gregory, female    DOB: 03/02/1961, 53 y.o.   MRN: [id number]  HPI Comments: 53 yo with fever and diarrhea x 1 day with increased body aches. She notes temp was 103 this a.m. She is feeling nauseated. Her last Diarrhea was this a.m. She has had left abdomen pain on/ off x several months. She notes pulse is high and mild SOB.      Medication List       This list is accurate as of: 07/25/13  5:03 PM.  Always use your most recent med list.               ALPRAZolam 1 MG tablet  Commonly known as:  XANAX  TAKE 1/2 TO 1 TABLET BY MOUTH 3 TIMES A DAY AS NEEDED FOR ANXIETY     amphetamine-dextroamphetamine 10 MG tablet  Commonly known as:  ADDERALL  Take 1 tablet (10 mg total) by mouth 2 (two) times daily with a meal.     aspirin 325 MG EC tablet  Take 325 mg by mouth daily.     buPROPion 300 MG 24 hr tablet  Commonly known as:  WELLBUTRIN XL  TAKE 1 TABLET BY MOUTH EVERY DAY WITH FOOD     cetirizine 10 MG tablet  Commonly known as:  ZYRTEC  Take 10 mg by mouth daily.     Diclofenac 35 MG Caps  Commonly known as:  ZORVOLEX  1 pill three times daily as needed for pain     ESTRING 2 MG vaginal ring  Generic drug:  estradiol  Place 2 mg vaginally every 3 (three) months.     fenofibrate micronized 134 MG capsule  Commonly known as:  LOFIBRA  Take 134 mg by mouth daily.     FLAX SEEDS PO  Take 1 capsule by mouth daily.     HYDROcodone-acetaminophen 5-325 MG per tablet  Commonly known as:  NORCO/VICODIN  Take 325 tablets by mouth every 6 (six) hours as needed (migraine).     levothyroxine 100 MCG tablet  Commonly known as:  SYNTHROID, LEVOTHROID  Take 100 mcg by mouth daily before breakfast.     magnesium oxide 400 MG tablet  Commonly known as:  MAG-OX  Take 400 mg by mouth 2 (two) times daily.     multivitamin with minerals Tabs tablet  Take 1 tablet by mouth daily.     omeprazole 40 MG capsule  Commonly known as:  PRILOSEC  Take 1  capsule (40 mg total) by mouth daily. For acid indigestion and reflux     predniSONE 20 MG tablet  Commonly known as:  DELTASONE  TAKE 1 TABLET BY MOUTH EVERY DAY AS NEEDED     prochlorperazine 5 MG tablet  Commonly known as:  COMPAZINE  Take 5 mg by mouth daily as needed for nausea.     PROVENTIL HFA 108 (90 BASE) MCG/ACT inhaler  Generic drug:  albuterol  Inhale 2 puffs into the lungs every 6 (six) hours as needed for wheezing or shortness of breath.     rosuvastatin 20 MG tablet  Commonly known as:  CRESTOR  Take 20 mg by mouth every other day.     topiramate 50 MG tablet  Commonly known as:  TOPAMAX  Take 150 mg by mouth daily.     traMADol 50 MG tablet  Commonly known as:  ULTRAM  Take 1 tablet (50 mg total) by mouth every 6 (six) hours as  needed.     vitamin B-12 1000 MCG tablet  Commonly known as:  CYANOCOBALAMIN  Take 1,000 mcg by mouth daily.     Vitamin D (Ergocalciferol) 50000 UNITS Caps capsule  Commonly known as:  DRISDOL  Take 50,000 Units by mouth every other day. 1.25 mg       Allergies  Allergen Reactions  . Penicillins Anaphylaxis and Hives  . Imitrex [Sumatriptan]   . Other Swelling    kiwi  . Sulfa Antibiotics Hives  . Zomig [Zolmitriptan]    Past Medical History  Diagnosis Date  . Anxiety   . Spasm of muscle   . Occlusion and stenosis of carotid artery with cerebral infarction   . Headache(784.0)   . Unspecified cerebral artery occlusion with cerebral infarction   . Elevated cholesterol     on Crestor  . Depression   . Hypothyroidism     S/P thyroidectomy  . Asthma   . Chronic heartburn   . Hypertension   . Anemia   . Migraine   . Vitamin D deficiency      Review of Systems  Constitutional: Positive for fever and fatigue.  Respiratory: Positive for shortness of breath.   Gastrointestinal: Positive for nausea and diarrhea.  All other systems reviewed and are negative.  BP 108/66  Pulse 120  Temp(Src) 99.2 F (37.3 C)  (Oral)  Resp 18  Ht 5' 8.25" (1.734 m)  Wt 158 lb (71.668 kg)  BMI 23.84 kg/m2     Objective:   Physical Exam  Nursing note and vitals reviewed. Constitutional: She is oriented to person, place, and time. She appears well-developed and well-nourished. No distress.  HENT:  Head: Normocephalic and atraumatic.  Right Ear: External ear normal.  Left Ear: External ear normal.  Nose: Nose normal.  Mouth/Throat: Oropharynx is clear and moist.  Eyes: Conjunctivae and EOM are normal.  Neck: Normal range of motion. Neck supple. No JVD present. No thyromegaly present.  Cardiovascular: Normal rate, regular rhythm, normal heart sounds and intact distal pulses.   Pulmonary/Chest: Effort normal and breath sounds normal.  Abdominal: Soft. Bowel sounds are normal. She exhibits no distension. There is no tenderness.  Musculoskeletal: Normal range of motion. She exhibits no edema and no tenderness.  Lymphadenopathy:    She has no cervical adenopathy.  Neurological: She is alert and oriented to person, place, and time. No cranial nerve deficit.  Skin: Skin is warm and dry. No rash noted. No erythema. No pallor.  Psychiatric: She has a normal mood and affect. Her behavior is normal. Judgment and thought content normal.          Assessment & Plan:  Probable dehydration vs viral colitis vs bacterial- Refer to ER for further evaluation

## 2013-07-25 NOTE — Discharge Instructions (Signed)
Zofran as prescribed as needed for nausea.  Clear liquids as tolerated for the next 12 hours, then slowly advance your diet.  Return to the emergency department if you develop bloody stool, severe abdominal pain, or any new or concerning symptoms.   Dehydration, Adult Dehydration is when you lose more fluids from the body than you take in. Vital organs like the kidneys, brain, and heart cannot function without a proper amount of fluids and salt. Any loss of fluids from the body can cause dehydration.  CAUSES   Vomiting.  Diarrhea.  Excessive sweating.  Excessive urine output.  Fever. SYMPTOMS  Mild dehydration  Thirst.  Dry lips.  Slightly dry mouth. Moderate dehydration  Very dry mouth.  Sunken eyes.  Skin does not bounce back quickly when lightly pinched and released.  Dark urine and decreased urine production.  Decreased tear production.  Headache. Severe dehydration  Very dry mouth.  Extreme thirst.  Rapid, weak pulse (more than 100 beats per minute at rest).  Cold hands and feet.  Not able to sweat in spite of heat and temperature.  Rapid breathing.  Blue lips.  Confusion and lethargy.  Difficulty being awakened.  Minimal urine production.  No tears. DIAGNOSIS  Your caregiver will diagnose dehydration based on your symptoms and your exam. Blood and urine tests will help confirm the diagnosis. The diagnostic evaluation should also identify the cause of dehydration. TREATMENT  Treatment of mild or moderate dehydration can often be done at home by increasing the amount of fluids that you drink. It is best to drink small amounts of fluid more often. Drinking too much at one time can make vomiting worse. Refer to the home care instructions below. Severe dehydration needs to be treated at the hospital where you will probably be given intravenous (IV) fluids that contain water and electrolytes. HOME CARE INSTRUCTIONS   Ask your caregiver about  specific rehydration instructions.  Drink enough fluids to keep your urine clear or pale yellow.  Drink small amounts frequently if you have nausea and vomiting.  Eat as you normally do.  Avoid:  Foods or drinks high in sugar.  Carbonated drinks.  Juice.  Extremely hot or cold fluids.  Drinks with caffeine.  Fatty, greasy foods.  Alcohol.  Tobacco.  Overeating.  Gelatin desserts.  Wash your hands well to avoid spreading bacteria and viruses.  Only take over-the-counter or prescription medicines for pain, discomfort, or fever as directed by your caregiver.  Ask your caregiver if you should continue all prescribed and over-the-counter medicines.  Keep all follow-up appointments with your caregiver. SEEK MEDICAL CARE IF:  You have abdominal pain and it increases or stays in one area (localizes).  You have a rash, stiff neck, or severe headache.  You are irritable, sleepy, or difficult to awaken.  You are weak, dizzy, or extremely thirsty. SEEK IMMEDIATE MEDICAL CARE IF:   You are unable to keep fluids down or you get worse despite treatment.  You have frequent episodes of vomiting or diarrhea.  You have blood or green matter (bile) in your vomit.  You have blood in your stool or your stool looks black and tarry.  You have not urinated in 6 to 8 hours, or you have only urinated a small amount of very dark urine.  You have a fever.  You faint. MAKE SURE YOU:   Understand these instructions.  Will watch your condition.  Will get help right away if you are not doing well or get  worse. Document Released: 03/15/2005 Document Revised: 06/07/2011 Document Reviewed: 11/02/2010 [facility] Patient Information 2014 [place], [place].  Diarrhea Diarrhea is frequent loose and watery bowel movements. It can cause you to feel weak and dehydrated. Dehydration can cause you to become tired and thirsty, have a dry mouth, and have decreased urination that often is dark  yellow. Diarrhea is a sign of another problem, most often an infection that will not last long. In most cases, diarrhea typically lasts 2 3 days. However, it can last longer if it is a sign of something more serious. It is important to treat your diarrhea as directed by your caregive to lessen or prevent future episodes of diarrhea. CAUSES  Some common causes include:  Gastrointestinal infections caused by viruses, bacteria, or parasites.  Food poisoning or food allergies.  Certain medicines, such as antibiotics, chemotherapy, and laxatives.  Artificial sweeteners and fructose.  Digestive disorders. HOME CARE INSTRUCTIONS  Ensure adequate fluid intake (hydration): have 1 cup (8 oz) of fluid for each diarrhea episode. Avoid fluids that contain simple sugars or sports drinks, fruit juices, whole milk products, and sodas. Your urine should be clear or pale yellow if you are drinking enough fluids. Hydrate with an oral rehydration solution that you can purchase at pharmacies, retail stores, and online. You can prepare an oral rehydration solution at home by mixing the following ingredients together:    tsp table salt.   tsp baking soda.   tsp salt substitute containing potassium chloride.  1  tablespoons sugar.  1 L (34 oz) of water.  Certain foods and beverages may increase the speed at which food moves through the gastrointestinal (GI) tract. These foods and beverages should be avoided and include:  Caffeinated and alcoholic beverages.  High-fiber foods, such as raw fruits and vegetables, nuts, seeds, and whole grain breads and cereals.  Foods and beverages sweetened with sugar alcohols, such as xylitol, sorbitol, and mannitol.  Some foods may be well tolerated and may help thicken stool including:  Starchy foods, such as rice, toast, pasta, low-sugar cereal, oatmeal, grits, baked potatoes, crackers, and bagels.  Bananas.  Applesauce.  Add probiotic-rich foods to help  increase healthy bacteria in the GI tract, such as yogurt and fermented milk products.  Wash your hands well after each diarrhea episode.  Only take over-the-counter or prescription medicines as directed by your caregiver.  Take a warm bath to relieve any burning or pain from frequent diarrhea episodes. SEEK IMMEDIATE MEDICAL CARE IF:   You are unable to keep fluids down.  You have persistent vomiting.  You have blood in your stool, or your stools are black and tarry.  You do not urinate in 6 8 hours, or there is only a small amount of very dark urine.  You have abdominal pain that increases or localizes.  You have weakness, dizziness, confusion, or lightheadedness.  You have a severe headache.  Your diarrhea gets worse or does not get better.  You have a fever or persistent symptoms for more than 2 3 days.  You have a fever and your symptoms suddenly get worse. MAKE SURE YOU:   Understand these instructions.  Will watch your condition.  Will get help right away if you are not doing well or get worse. Document Released: 03/05/2002 Document Revised: 03/01/2012 Document Reviewed: 11/21/2011 [facility] Patient Information 2014 [place]ExitCare, [place].

## 2013-07-25 NOTE — ED Provider Notes (Signed)
CSN: [id number]     Arrival date & time 07/25/13  1810 History  This chart was scribed for Geoffery Lyonsouglas Chester Sibert, MD by Luisa DagoPriscilla Tutu, ED Scribe. This patient was seen in room MH04/MH04 and the patient's care was started at 6:41 PM.    Chief Complaint  Patient presents with  . Diarrhea   The history is provided by the patient. No language interpreter was used.   HPI Comments: Ruth Gregory is a 53 y.o. female who presents to the Emergency Department complaining of diarrhea that started yesterday. She states that when the symptoms started she was experiencing nausea, chills, abdominal pain, fever, and left sided myalgias. Pt states that she had multiple diarrhea episodes since this morning. She states that last night her measured TMAX was 102. Current ED temperature is 99.2. Pt reports a history of hysterectomy. Pt also has a history of CVA in 2009. She states that the neurologist believes that the stroke may have been caused by her throwing up. She denies being on any blood thinners. Pt states that she is supposed to take an Asprin everyday but she hasn't been doing so in about a week. She denies any back pain or taking any antibiotics.    Pt states that the CVA left her paralyzed on her left side, left foot nimbleness, and limited sensation to there left hand.   Past Medical History  Diagnosis Date  . Anxiety   . Spasm of muscle   . Occlusion and stenosis of carotid artery with cerebral infarction   . Headache(784.0)   . Unspecified cerebral artery occlusion with cerebral infarction   . Elevated cholesterol     on Crestor  . Depression   . Hypothyroidism     S/P thyroidectomy  . Asthma   . Chronic heartburn   . Hypertension   . Anemia   . Migraine   . Vitamin D deficiency    Past Surgical History  Procedure Laterality Date  . Vaginal hysterectomy    . Nasal sinus surgery    . Cystocele repair N/A 01/08/2013    Procedure: ANTERIOR REPAIR (CYSTOCELE);  Surgeon: Meriel Picaichard M Holland, MD;   Location: [facility];  Service: Gynecology;  Laterality: N/A;  . Cosmetic surgery     Family History  Problem Relation Age of Onset  . Heart disease Mother   . Peptic Ulcer Disease Mother   . Heart disease Father   . Parkinson's disease Father    History  Substance Use Topics  . Smoking status: Never Smoker   . Smokeless tobacco: Never Used  . Alcohol Use: 0.0 oz/week     Comment: occassionally drinks a glass of wine   OB History   Grav Para Term Preterm Abortions TAB SAB Ect Mult Living                 Review of Systems A complete 10 system review of systems was obtained and all systems are negative except as noted in the HPI and PMH.     Allergies  Penicillins; Imitrex; Other; Sulfa antibiotics; and Zomig  Home Medications   Prior to Admission medications   Medication Sig Start Date End Date Taking? Authorizing Provider  ALPRAZolam (XANAX) 1 MG tablet TAKE 1/2 TO 1 TABLET BY MOUTH 3 TIMES A DAY AS NEEDED FOR ANXIETY    Melissa R Shepperson, PA-C  amphetamine-dextroamphetamine (ADDERALL) 10 MG tablet Take 1 tablet (10 mg total) by mouth 2 (two) times daily with a meal. 07/13/13 07/13/14  Quentin MullingAmanda Collier,  PA-C  aspirin 325 MG EC tablet Take 325 mg by mouth daily.    Historical Provider, MD  buPROPion (WELLBUTRIN XL) 300 MG 24 hr tablet TAKE 1 TABLET BY MOUTH EVERY DAY WITH FOOD 06/28/13   Melissa R Graca, PA-C  cetirizine (ZYRTEC) 10 MG tablet Take 10 mg by mouth daily.    Historical Provider, MD  Diclofenac (ZORVOLEX) 35 MG CAPS 1 pill three times daily as needed for pain 07/13/13   Quentin MullingAmanda Collier, PA-C  ESTRING 2 MG vaginal ring Place 2 mg vaginally every 3 (three) months.  02/08/13   Historical Provider, MD  fenofibrate micronized (LOFIBRA) 134 MG capsule Take 134 mg by mouth daily. 12/06/12   Historical Provider, MD  Flaxseed, Linseed, (FLAX SEEDS PO) Take 1 capsule by mouth daily.    Historical Provider, MD  HYDROcodone-acetaminophen (NORCO/VICODIN) 5-325 MG per tablet Take 325  tablets by mouth every 6 (six) hours as needed (migraine).  10/11/12   Historical Provider, MD  levothyroxine (SYNTHROID, LEVOTHROID) 100 MCG tablet Take 100 mcg by mouth daily before breakfast.    Historical Provider, MD  magnesium oxide (MAG-OX) 400 MG tablet Take 400 mg by mouth 2 (two) times daily.     Historical Provider, MD  Multiple Vitamin (MULTIVITAMIN WITH MINERALS) TABS tablet Take 1 tablet by mouth daily.    Historical Provider, MD  omeprazole (PRILOSEC) 40 MG capsule Take 1 capsule (40 mg total) by mouth daily. For acid indigestion and reflux 04/04/13 04/04/14  Lucky CowboyWilliam McKeown, MD  predniSONE (DELTASONE) 20 MG tablet TAKE 1 TABLET BY MOUTH EVERY DAY AS NEEDED 04/04/13   Lucky CowboyWilliam McKeown, MD  prochlorperazine (COMPAZINE) 5 MG tablet Take 5 mg by mouth daily as needed for nausea.  11/30/12   Historical Provider, MD  PROVENTIL HFA 108 (90 BASE) MCG/ACT inhaler Inhale 2 puffs into the lungs every 6 (six) hours as needed for wheezing or shortness of breath.  09/27/12   Historical Provider, MD  rosuvastatin (CRESTOR) 20 MG tablet Take 20 mg by mouth every other day.     Historical Provider, MD  topiramate (TOPAMAX) 50 MG tablet Take 150 mg by mouth daily.     Historical Provider, MD  traMADol (ULTRAM) 50 MG tablet Take 1 tablet (50 mg total) by mouth every 6 (six) hours as needed. 02/01/13   Melissa R Shimkus, PA-C  vitamin B-12 (CYANOCOBALAMIN) 1000 MCG tablet Take 1,000 mcg by mouth daily.    Historical Provider, MD  Vitamin D, Ergocalciferol, (DRISDOL) 50000 UNITS CAPS capsule Take 50,000 Units by mouth every other day. 1.25 mg    Historical Provider, MD   Pulse 96  Resp 16  SpO2 98%  Physical Exam  Nursing note and vitals reviewed. Constitutional: She appears well-developed and well-nourished. No distress.  HENT:  Head: Normocephalic and atraumatic.  Mouth/Throat: Oropharynx is clear and moist.  Eyes: Conjunctivae are normal. Right eye exhibits no discharge. Left eye exhibits no discharge.   Neck: Neck supple.  Cardiovascular: Regular rhythm and normal heart sounds.  Exam reveals no gallop and no friction rub.   No murmur heard. HR is somewhat rapid but regular.   Pulmonary/Chest: Effort normal and breath sounds normal. No respiratory distress.  Abdominal: Soft. Bowel sounds are normal. She exhibits no distension and no mass. There is no tenderness. There is no rebound and no guarding.  Musculoskeletal: She exhibits no edema and no tenderness.  Neurological: She is alert.  Skin: Skin is warm and dry.  Psychiatric: She has a normal mood  and affect. Her behavior is normal. Thought content normal.    ED Course  Procedures (including critical care time)  DIAGNOSTIC STUDIES: Oxygen Saturation is 98% on RA, normal by my interpretation.    COORDINATION OF CARE: 6:57 PM- Will order fluids and nausea medication. Pt advised of plan for treatment and pt agrees.  Medications  sodium chloride 0.9 % bolus 1,000 mL (1,000 mLs Intravenous New Bag/Given 07/25/13 1906)  sodium chloride 0.9 % bolus 1,000 mL (not administered)  ondansetron (ZOFRAN) injection 4 mg (4 mg Intravenous Given 07/25/13 1907)  ketorolac (TORADOL) 30 MG/ML injection 30 mg (30 mg Intravenous Given 07/25/13 1906)   Labs Review Labs Reviewed - No data to display  Imaging Review No results found.   EKG Interpretation None      MDM   Final diagnoses:  None    Patient is a 53 year old female with history of CVA, hypothyroidism, hypertension. She presents today with complaints of nausea and diarrhea since yesterday. She was seen at the primary care office and sent here for hydration as her heart rate was 120. Her abdominal exam is benign, she is afebrile, and has no white count. She was given 2 L of normal saline along with Toradol and Zofran and is feeling markedly improved. Her tachycardia has resolved and she appears appropriate for discharge. She will be prescribed ODT Zofran and advised to return if her  symptoms substantially worsen or change.  I personally performed the services described in this documentation, which was scribed in my presence. The recorded information has been reviewed and is accurate.    Geoffery Lyons, MD 07/25/13 2118

## 2013-07-25 NOTE — ED Notes (Addendum)
Pt c/o generalized weakness with diarrhea x 2 days   Sent here by PMD for ? dehydration

## 2013-07-27 ENCOUNTER — Other Ambulatory Visit: Payer: Self-pay | Admitting: Internal Medicine

## 2013-08-02 ENCOUNTER — Other Ambulatory Visit: Payer: Self-pay | Admitting: Internal Medicine

## 2013-08-02 MED ORDER — HYDROCODONE-ACETAMINOPHEN 5-325 MG PO TABS: 1.0000 | ORAL_TABLET | ORAL | Status: AC | PRN

## 2013-08-28 ENCOUNTER — Telehealth: Payer: Self-pay | Admitting: Neurology

## 2013-08-28 DIAGNOSIS — G43909 Migraine, unspecified, not intractable, without status migrainosus: Secondary | ICD-10-CM

## 2013-08-28 DIAGNOSIS — I639 Cerebral infarction, unspecified: Secondary | ICD-10-CM

## 2013-08-28 NOTE — Telephone Encounter (Signed)
Calling to see if she is due for her Ultrasound on her Stent

## 2013-08-28 NOTE — Telephone Encounter (Signed)
Last carotid doppler study in centricity 01-05-12.  (normal study).  Stent in R ICA from the bulb to the distal portions.  The L ICA was tortuous.

## 2013-09-07 ENCOUNTER — Ambulatory Visit (INDEPENDENT_AMBULATORY_CARE_PROVIDER_SITE_OTHER): Payer: BC Managed Care – PPO

## 2013-09-07 DIAGNOSIS — I639 Cerebral infarction, unspecified: Secondary | ICD-10-CM

## 2013-09-07 DIAGNOSIS — G43909 Migraine, unspecified, not intractable, without status migrainosus: Secondary | ICD-10-CM

## 2013-09-07 DIAGNOSIS — I6350 Cerebral infarction due to unspecified occlusion or stenosis of unspecified cerebral artery: Secondary | ICD-10-CM

## 2013-09-14 ENCOUNTER — Telehealth: Payer: Self-pay | Admitting: Radiology

## 2013-09-17 ENCOUNTER — Other Ambulatory Visit: Payer: Self-pay | Admitting: Emergency Medicine

## 2013-10-03 ENCOUNTER — Encounter: Payer: Self-pay | Admitting: Internal Medicine

## 2013-10-03 ENCOUNTER — Encounter: Payer: Self-pay | Admitting: Neurology

## 2013-10-03 ENCOUNTER — Encounter (INDEPENDENT_AMBULATORY_CARE_PROVIDER_SITE_OTHER): Payer: Self-pay

## 2013-10-03 ENCOUNTER — Ambulatory Visit (INDEPENDENT_AMBULATORY_CARE_PROVIDER_SITE_OTHER): Payer: BC Managed Care – PPO | Admitting: Internal Medicine

## 2013-10-03 ENCOUNTER — Other Ambulatory Visit: Payer: Self-pay | Admitting: Internal Medicine

## 2013-10-03 ENCOUNTER — Ambulatory Visit (INDEPENDENT_AMBULATORY_CARE_PROVIDER_SITE_OTHER): Payer: BC Managed Care – PPO | Admitting: Neurology

## 2013-10-03 VITALS — BP 118/84 | HR 76 | Temp 97.9°F | Resp 16 | Ht 68.5 in | Wt 160.4 lb

## 2013-10-03 DIAGNOSIS — I63239 Cerebral infarction due to unspecified occlusion or stenosis of unspecified carotid arteries: Secondary | ICD-10-CM

## 2013-10-03 DIAGNOSIS — F988 Other specified behavioral and emotional disorders with onset usually occurring in childhood and adolescence: Secondary | ICD-10-CM

## 2013-10-03 DIAGNOSIS — N3000 Acute cystitis without hematuria: Secondary | ICD-10-CM

## 2013-10-03 DIAGNOSIS — M62838 Other muscle spasm: Secondary | ICD-10-CM

## 2013-10-03 DIAGNOSIS — E782 Mixed hyperlipidemia: Secondary | ICD-10-CM

## 2013-10-03 DIAGNOSIS — G8114 Spastic hemiplegia affecting left nondominant side: Secondary | ICD-10-CM

## 2013-10-03 DIAGNOSIS — R7309 Other abnormal glucose: Secondary | ICD-10-CM

## 2013-10-03 DIAGNOSIS — G8110 Spastic hemiplegia affecting unspecified side: Secondary | ICD-10-CM

## 2013-10-03 DIAGNOSIS — R7303 Prediabetes: Secondary | ICD-10-CM

## 2013-10-03 DIAGNOSIS — R030 Elevated blood-pressure reading, without diagnosis of hypertension: Secondary | ICD-10-CM

## 2013-10-03 DIAGNOSIS — G43909 Migraine, unspecified, not intractable, without status migrainosus: Secondary | ICD-10-CM

## 2013-10-03 DIAGNOSIS — I639 Cerebral infarction, unspecified: Secondary | ICD-10-CM

## 2013-10-03 DIAGNOSIS — E559 Vitamin D deficiency, unspecified: Secondary | ICD-10-CM

## 2013-10-03 DIAGNOSIS — Z87898 Personal history of other specified conditions: Secondary | ICD-10-CM | POA: Insufficient documentation

## 2013-10-03 DIAGNOSIS — Z79899 Other long term (current) drug therapy: Secondary | ICD-10-CM | POA: Insufficient documentation

## 2013-10-03 LAB — CBC WITH DIFFERENTIAL/PLATELET
Basophils Absolute: 0 10*3/uL (ref 0.0–0.1)
Basophils Relative: 0 % (ref 0–1)
Eosinophils Absolute: 0.3 10*3/uL (ref 0.0–0.7)
Eosinophils Relative: 6 % — ABNORMAL HIGH (ref 0–5)
HCT: 41.8 % (ref 36.0–46.0)
Hemoglobin: 14.8 g/dL (ref 12.0–15.0)
Lymphocytes Relative: 28 % (ref 12–46)
Lymphs Abs: 1.5 10*3/uL (ref 0.7–4.0)
MCH: 31.2 pg (ref 26.0–34.0)
MCHC: 35.4 g/dL (ref 30.0–36.0)
MCV: 88.2 fL (ref 78.0–100.0)
Monocytes Absolute: 0.4 10*3/uL (ref 0.1–1.0)
Monocytes Relative: 7 % (ref 3–12)
Neutro Abs: 3.2 10*3/uL (ref 1.7–7.7)
Neutrophils Relative %: 59 % (ref 43–77)
Platelets: 218 10*3/uL (ref 150–400)
RBC: 4.74 MIL/uL (ref 3.87–5.11)
RDW: 13.4 % (ref 11.5–15.5)
WBC: 5.5 10*3/uL (ref 4.0–10.5)

## 2013-10-03 LAB — HEMOGLOBIN A1C
Hgb A1c MFr Bld: 5.7 % — ABNORMAL HIGH (ref <5.7)
Mean Plasma Glucose: 117 mg/dL — ABNORMAL HIGH (ref <117)

## 2013-10-03 MED ORDER — AMPHETAMINE-DEXTROAMPHETAMINE 10 MG PO TABS: ORAL_TABLET | ORAL | Status: DC

## 2013-10-03 MED ORDER — ONABOTULINUMTOXINA 100 UNITS IJ SOLR
300.0000 [IU] | Freq: Once | INTRAMUSCULAR | Status: AC
  Administered 2013-10-03: 300 [IU] via INTRAMUSCULAR

## 2013-10-03 MED ORDER — BUTALBITAL-APAP-CAFFEINE 50-325-40 MG PO TABS: 1.0000 | ORAL_TABLET | Freq: Four times a day (QID) | ORAL | Status: DC | PRN

## 2013-10-03 NOTE — Progress Notes (Signed)
History of Present Illness: [name] is here for EMG guided BOTOX injection.  She has history of right internal carotid artery dissection following an episode of vomiting from migraine headache in mid June 2009. She was given IV TPA and found to have right MCA occlusion. She underwent emergent right carotid artery stent with distal endovascular recanalization of the middle cerebral artery.  She had small hemorrhagic transformation of the right basal ganglia with mild left hemiparesis, but has done well since then and has been independent.  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  She has made marked recovery, ambulating only with very mild difficulty,  maintain majority of her left arm function,  there is mild limitation in the range of motion in her left shoulder,  mild left shoulder stiffness and pain, most bothersome symptoms is her left elbow discomfort, left wrist achy pain, difficulty releasing left hand flexion, left arm persistent flexion,  left ankle plantar inversion, increased gait difficulty after prolonged walking, This has all been helped by Botox injection.  She has been receiving BOTOX injection since 01/2009 q3 months, to her left upper extremity and also left lower extremity, responded well.  Repeat US carotid were normal in October 2013  UPDATE March 19th 2015:  Last EMG guided injection was in Dec 2014, she responded very well, she received total of 300 units, there was no significant side effect  Physical Exam  General: Pleasant Caucasian lady , in no distress.  Afebrile.   Head: nontraumatic Musculoskeletal: no deformity Skin: no rash, petechiae  Neurologic Exam  Mental Status: pleasant, awake, alert, cooperative to history, talking, and casual conversation. Cranial Nerves: CN II-XII pupils were equal round reactive to light.   Extraocular movements were full.  Visual fields were full on confrontational test.  slight shallow left nasolabial fold.  Hearing was  intact to finger rubbing bilaterally.  Uvula tongue were midline.  Head turning and shoulder shrugging were normal and symmetric.  Tongue protrusion into the cheeks strength were normal.  Motor: mild spastic hemiparesis of left arm, tends to stay at left elbow flexion, pronation, mild left shoulder droop, decreased arm swing when ambulating. Mild left finger flexion, weak grip 4, left thumb tends to stay in flexed position.  Left ankle slight limited range of motion, tends to stay in left ankle plantar inversion. Sensory: Normal to light touch, pinprick, proprioception, and vibratory sensation. Coordination: There was no dysmetria noticed. Gait and Station: Narrow based and steady, mild left ankle plantar inversion, decreased left arm swing, slight left shoulder droop Reflexes: Deep tendon reflexes are 2+, hyper reflexia on left side  Plantars are downgoing.     Assessment and Plan:  Spastic left hemiparesis, due to right MCA stroke in June 2009, right internal carotid artery dissection, s/p right IC stent.   Under electrical stimulations,  300units of BOTOX was used.  Left brachialis 25 Left biceps 25  Left flexor digitorum profundi 12.5 Left pronator 25 Left flexor digitorum superficialis 12.5   Left pectoralis major 25 units Left teres major 25 units Left latissimus dorsi 25 Left levator scapula 25 untis   Left flexor digitorum longus 50 units Left tibialis posterior 50 units  She will return in 3 months for repeat injection,

## 2013-10-03 NOTE — Progress Notes (Signed)
Patient ID: Ruth Gregory, female   DOB: 08/31/1960, 53 y.o.   MRN: [id number]   This very nice 52 y.o.MWF presents for 3 month follow up with Hypertension, Hyperlipidemia, Pre-Diabetes and Vitamin D Deficiency.  Patient was rrecenrly started on Adderall for ADD Sx's and she's taking a very low dose and report marked improvement in her concentration, focusing and executive thought processes.   HTN predates since 2008 . BP has been controlled and today's BP: 118/84 mmHg. Patient denies any cardiac type chest pain, palpitations, dyspnea/orthopnea/PND, dizziness, claudication, or dependent edema.   In 2009 patient had a Right MCA dissection felt due to fibromuscular Dysplasia causing CVA with a mild residual Left HP and is followed by Dr Terrace ArabiaYan who's currently treating her with Botox injections with improvement in her spasticity.   Hyperlipidemia is controlled with diet & meds. Patient denies myalgias or other med SE's. Last Lipids were at goal. In Jan 2015. Lab Results  Component Value Date   CHOL 153 04/04/2013   HDL 65 04/04/2013   LDLCALC 74 04/04/2013   TRIG 72 04/04/2013   CHOLHDL 2.4 04/04/2013     Also, the patient has history of PreDiabetes since     and last A1c was     Lab Results  Component Value Date   HGBA1C 5.6 04/04/2013   Patient denies any symptoms of reactive hypoglycemia, diabetic polys, paresthesias or visual blurring.   Further, Patient has history of Vitamin D Deficiency   and last vitamin D was Lab Results  Component Value Date   VD25OH 101* 04/04/2013   Patient supplements vitamin D without any suspected side-effects.    Medication List       This list is accurate as of: 10/03/13  8:01 PM.  Always use your most recent med list.               ALPRAZolam 1 MG tablet  Commonly known as:  XANAX  TAKE 1/2 - 1 TABLET BY MOUTH 3 TIMES DAILY AS NEEDED FOR ANXIETY     amphetamine-dextroamphetamine 10 MG tablet  Commonly known as:  ADDERALL  Take 1/2 to 1 tablet 1 or 2 x daily for ADD      aspirin 325 MG EC tablet  Take 325 mg by mouth daily.     buPROPion 300 MG 24 hr tablet  Commonly known as:  WELLBUTRIN XL  TAKE 1 TABLET BY MOUTH EVERY DAY WITH FOOD     butalbital-acetaminophen-caffeine 50-325-40 MG per tablet  Commonly known as:  FIORICET, ESGIC  Take 1 tablet by mouth every 6 (six) hours as needed for headache.     cetirizine 10 MG tablet  Commonly known as:  ZYRTEC  Take 10 mg by mouth daily.     Diclofenac 35 MG Caps  Commonly known as:  ZORVOLEX  1 pill three times daily as needed for pain     ESTRING 2 MG vaginal ring  Generic drug:  estradiol  Place 2 mg vaginally every 3 (three) months.     fenofibrate micronized 134 MG capsule  Commonly known as:  LOFIBRA  Take 134 mg by mouth daily.     FLAX SEEDS PO  Take 1 capsule by mouth daily.     HYDROcodone-acetaminophen 5-325 MG per tablet  Commonly known as:  NORCO/VICODIN  325 tablets daily.     levothyroxine 100 MCG tablet  Commonly known as:  SYNTHROID, LEVOTHROID  Take 100 mcg by mouth daily before breakfast.     magnesium  oxide 400 MG tablet  Commonly known as:  MAG-OX  Take 400 mg by mouth 2 (two) times daily.     multivitamin with minerals Tabs tablet  Take 1 tablet by mouth daily.     omeprazole 40 MG capsule  Commonly known as:  PRILOSEC  Take 1 capsule (40 mg total) by mouth daily. For acid indigestion and reflux     predniSONE 20 MG tablet  Commonly known as:  DELTASONE  TAKE 1 TABLET BY MOUTH EVERY DAY AS NEEDED     prochlorperazine 5 MG tablet  Commonly known as:  COMPAZINE  TAKE 1 TABLET 3 TIMES A DAY AS NEEDED FOR NAUSEA     PROVENTIL HFA 108 (90 BASE) MCG/ACT inhaler  Generic drug:  albuterol  Inhale 2 puffs into the lungs every 6 (six) hours as needed for wheezing or shortness of breath.     rosuvastatin 20 MG tablet  Commonly known as:  CRESTOR  Take 20 mg by mouth every other day.     topiramate 50 MG tablet  Commonly known as:  TOPAMAX  Take 150 mg by  mouth daily.     traMADol 50 MG tablet  Commonly known as:  ULTRAM  Take 1 tablet (50 mg total) by mouth every 6 (six) hours as needed.     vitamin B-12 1000 MCG tablet  Commonly known as:  CYANOCOBALAMIN  Take 1,000 mcg by mouth daily.     Vitamin D (Ergocalciferol) 50000 UNITS Caps capsule  Commonly known as:  DRISDOL  TAKE ONE CAPSULE BY MOUTH DAILY       Allergies  Allergen Reactions  . Penicillins Anaphylaxis and Hives  . Imitrex [Sumatriptan]   . Other Swelling    kiwi  . Sulfa Antibiotics Hives  . Zomig [Zolmitriptan]    PMHx:   Past Medical History  Diagnosis Date  . Anxiety   . Spasm of muscle   . Occlusion and stenosis of carotid artery with cerebral infarction   . Headache(784.0)   . Unspecified cerebral artery occlusion with cerebral infarction   . Elevated cholesterol     on Crestor  . Depression   . Hypothyroidism     S/P thyroidectomy  . Asthma   . Chronic heartburn   . Hypertension   . Anemia   . Migraine   . Vitamin D deficiency    FHx:    Reviewed / unchanged  SHx:    Reviewed / unchanged  Systems Review:  Constitutional: Denies fever, chills, wt changes, headaches, insomnia, fatigue, night sweats, change in appetite. Eyes: Denies redness, blurred vision, diplopia, discharge, itchy, watery eyes.  ENT: Denies discharge, congestion, post nasal drip, epistaxis, sore throat, earache, hearing loss, dental pain, tinnitus, vertigo, sinus pain, snoring.  CV: Denies chest pain, palpitations, irregular heartbeat, syncope, dyspnea, diaphoresis, orthopnea, PND, claudication or edema. Respiratory: denies cough, dyspnea, DOE, pleurisy, hoarseness, laryngitis, wheezing.  Gastrointestinal: Denies dysphagia, odynophagia, heartburn, reflux, water brash, abdominal pain or cramps, nausea, vomiting, bloating, diarrhea, constipation, hematemesis, melena, hematochezia  or hemorrhoids. Genitourinary: Denies dysuria, frequency, urgency, nocturia, hesitancy, discharge,  hematuria or flank pain. Musculoskeletal: Denies arthralgias, myalgias, stiffness, jt. swelling, pain, limping or strain/sprain.  Skin: Denies pruritus, rash, hives, warts, acne, eczema or change in skin lesion(s). Neuro: No weakness, tremor, incoordination, spasms, paresthesia or pain. Psychiatric: Denies confusion, memory loss or sensory loss. Endo: Denies change in weight, skin or hair change.  Heme/Lymph: No excessive bleeding, bruising or enlarged lymph nodes.  Exam:  BP 118/84  Pulse 76  Temp 97.9 F  Resp 16  Ht 5' 8.5"   Wt 160 lb 6.4 oz   BMI 24.03 kg/m2  Appears well nourished and in no distress. Eyes: PERRLA, EOMs, conjunctiva no swelling or erythema. Sinuses: No frontal/maxillary tenderness ENT/Mouth: EAC's clear, TM's nl w/o erythema, bulging. Nares clear w/o erythema, swelling, exudates. Oropharynx clear without erythema or exudates. Oral hygiene is good. Tongue normal, non obstructing. Hearing intact.  Neck: Supple. Thyroid nl. Car 2+/2+ without bruits, nodes or JVD. Chest: Respirations nl with BS clear & equal w/o rales, rhonchi, wheezing or stridor.  Cor: Heart sounds normal w/ regular rate and rhythm without sig. murmurs, gallops, clicks, or rubs. Peripheral pulses normal and equal  without edema.  Abdomen: Soft & bowel sounds normal. Non-tender w/o guarding, rebound, hernias, masses, or organomegaly.  Lymphatics: Unremarkable.  Musculoskeletal:Very mild spastic left HP with mild flexion contracturing of the left hand. Gait appears normal. Skin: Warm, dry without exposed rashes, lesions or ecchymosis apparent.  Neuro: Cranial nerves intact, reflexes equal bilaterally. Sensory-motor testing grossly intact. Tendon reflexes grossly intact.  Pysch: Alert & oriented x 3. Insight and judgement nl & appropriate. No ideations.  Assessment and Plan:  1. Hypertension - Continue monitor blood pressure at home. Continue diet/meds same.  2. Hyperlipidemia - Continue  diet/meds, exercise,& lifestyle modifications. Continue monitor periodic cholesterol/liver & renal functions   3. Pre-Diabetes - Continue diet, exercise, lifestyle modifications. Monitor appropriate labs.  4. Vitamin D Deficiency - Continue supplementation.  5. Migraine  6. ADD  7. Right Brain CVA suspect due to fibromuscular dysplasia -     Recommended regular exercise, BP monitoring, weight control, and discussed med and SE's. Recommended labs to assess and monitor clinical status. Further disposition pending results of labs.

## 2013-10-04 LAB — LIPID PANEL
Cholesterol: 181 mg/dL (ref 0–200)
HDL: 66 mg/dL (ref >39)
LDL Cholesterol: 91 mg/dL (ref 0–99)
Total CHOL/HDL Ratio: 2.7 Ratio
Triglycerides: 120 mg/dL (ref <150)
VLDL: 24 mg/dL (ref 0–40)

## 2013-10-04 LAB — URINE CULTURE: Colony Count: 40000

## 2013-10-04 LAB — URINALYSIS, MICROSCOPIC ONLY
Bacteria, UA: NONE SEEN
Casts: NONE SEEN

## 2013-10-04 LAB — HEPATIC FUNCTION PANEL
ALT: 55 U/L — ABNORMAL HIGH (ref 0–35)
AST: 33 U/L (ref 0–37)
Albumin: 4.5 g/dL (ref 3.5–5.2)
Alkaline Phosphatase: 81 U/L (ref 39–117)
Bilirubin, Direct: 0.1 mg/dL (ref 0.0–0.3)
Indirect Bilirubin: 0.4 mg/dL (ref 0.2–1.2)
Total Bilirubin: 0.5 mg/dL (ref 0.2–1.2)
Total Protein: 6.8 g/dL (ref 6.0–8.3)

## 2013-10-04 LAB — BASIC METABOLIC PANEL WITH GFR
BUN: 21 mg/dL (ref 6–23)
CO2: 27 mEq/L (ref 19–32)
Calcium: 9.7 mg/dL (ref 8.4–10.5)
Chloride: 103 mEq/L (ref 96–112)
Creat: 0.83 mg/dL (ref 0.50–1.10)
GFR, Est African American: >89 mL/min
GFR, Est Non African American: 80 mL/min
Glucose, Bld: 90 mg/dL (ref 70–99)
Potassium: 4.3 mEq/L (ref 3.5–5.3)
Sodium: 138 mEq/L (ref 135–145)

## 2013-10-04 LAB — TSH: TSH: 0.289 u[IU]/mL — ABNORMAL LOW (ref 0.350–4.500)

## 2013-10-04 LAB — INSULIN, FASTING: Insulin fasting, serum: 22 u[IU]/mL (ref 3–28)

## 2013-10-04 LAB — MAGNESIUM: Magnesium: 2.1 mg/dL (ref 1.5–2.5)

## 2013-10-04 LAB — VITAMIN D 25 HYDROXY (VIT D DEFICIENCY, FRACTURES): Vit D, 25-Hydroxy: 74 ng/mL (ref 30–89)

## 2013-10-25 ENCOUNTER — Other Ambulatory Visit: Payer: Self-pay | Admitting: Internal Medicine

## 2013-10-25 MED ORDER — HYDROCODONE-ACETAMINOPHEN 5-325 MG PO TABS: ORAL_TABLET | ORAL | Status: AC

## 2013-11-22 ENCOUNTER — Other Ambulatory Visit: Payer: Self-pay | Admitting: Physician Assistant

## 2013-11-25 ENCOUNTER — Other Ambulatory Visit: Payer: Self-pay | Admitting: Emergency Medicine

## 2013-11-26 ENCOUNTER — Telehealth: Payer: Self-pay | Admitting: Neurology

## 2013-11-26 NOTE — Telephone Encounter (Signed)
Patient calling wanting to set up an appointment with Dr. Terrace Arabia to discuss her migraines worsening and coming frequently. Please return call and advise.

## 2013-11-26 NOTE — Telephone Encounter (Signed)
Called patient and left her message asking her to call me back Dr.Yan has opening 11-27-2013 she can go in any slot we have open on 11-27-2013.

## 2013-11-26 NOTE — Telephone Encounter (Signed)
Patient called me back she is on Dr.Yan schedule for 11-27-2013.

## 2013-11-27 ENCOUNTER — Encounter: Payer: Self-pay | Admitting: Neurology

## 2013-11-27 ENCOUNTER — Ambulatory Visit (INDEPENDENT_AMBULATORY_CARE_PROVIDER_SITE_OTHER): Payer: BC Managed Care – PPO | Admitting: Neurology

## 2013-11-27 VITALS — Ht 70.0 in | Wt 160.0 lb

## 2013-11-27 DIAGNOSIS — G8110 Spastic hemiplegia affecting unspecified side: Secondary | ICD-10-CM

## 2013-11-27 DIAGNOSIS — G8114 Spastic hemiplegia affecting left nondominant side: Secondary | ICD-10-CM

## 2013-11-27 DIAGNOSIS — G43909 Migraine, unspecified, not intractable, without status migrainosus: Secondary | ICD-10-CM

## 2013-11-27 MED ORDER — TOPIRAMATE ER 100 MG PO CAP24: 100.0000 mg | ORAL_CAPSULE | Freq: Every day | ORAL | Status: DC

## 2013-11-27 MED ORDER — TROKENDI XR 50 MG PO CP24: 50.0000 mg | ORAL_CAPSULE | Freq: Every day | ORAL | Status: DC

## 2013-11-27 MED ORDER — DICLOFENAC POTASSIUM(MIGRAINE) 50 MG PO PACK: 50.0000 mg | PACK | ORAL | Status: DC | PRN

## 2013-11-27 NOTE — Progress Notes (Signed)
History of Present Illness: [name] is here for EMG guided BOTOX injection.  She has history of right internal carotid artery dissection following an episode of vomiting from migraine headache in mid June 2009. She was given IV TPA and found to have right MCA occlusion. She underwent emergent right carotid artery stent with distal endovascular recanalization of the middle cerebral artery.  She had small hemorrhagic transformation of the right basal ganglia with mild left hemiparesis, but has done well since then and has been independent.  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  She has been receiving BOTOX injection since 01/2009 q3 months, to her left upper extremity and also left lower extremity, responded well.  Repeat US carotid were normal in October 2013  UPDATE Sep 1st 2015: Previous Botox injection to her left upper, and lower extremity has been very helpful in July 2015,  she came in early today, for evaluation of more frequent headaches, she used to have about one severe migraine each month, in July 2015, she had some major medication changes, she was put on Adderall, which has helped her focusing, she take 10 mg one to one and half tablets each day, in the meantime, she has weaned herself off Topamax 150 mg daily, which did help her headache, but  feeling sluggish  Over the past couple months, she has been having 2-3 major migraine each month, lasting 2-3 days, she has tried  tramadol, Fioricet, Xanax, Vicodin, extra dose of magnesium oxide, riboflavin, even prednisone 20 mg one to one and half tablets as needed for headaches,  Physical Exam  General: Pleasant Caucasian lady , in no distress.  Afebrile.   Head: nontraumatic Musculoskeletal: no deformity Skin: no rash, petechiae  Neurologic Exam  Mental Status: pleasant, awake, alert, cooperative to history, talking, and casual conversation. Cranial Nerves: CN II-XII pupils were equal round reactive to light.   Extraocular movements were full.  Visual fields were full on confrontational test.  slight shallow left nasolabial fold.  Hearing was intact to finger rubbing bilaterally.  Uvula tongue were midline.  Head turning and shoulder shrugging were normal and symmetric.  Tongue protrusion into the cheeks strength were normal.  Motor: mild spastic hemiparesis of left arm, tends to stay at left elbow flexion, pronation, mild left shoulder droop, decreased arm swing when ambulating. Mild left finger flexion, weak grip 4, left thumb tends to stay in flexed position.  Left ankle slight limited range of motion, tends to stay in left ankle plantar inversion. Sensory: Normal to light touch, pinprick, proprioception, and vibratory sensation. Coordination: There was no dysmetria noticed. Gait and Station: Narrow based and steady, mild left ankle plantar inversion, decreased left arm swing, slight left shoulder droop Reflexes: Deep tendon reflexes are 2+, hyper reflexia on left side  Plantars are downgoing.     Assessment and Plan:  Spastic left hemiparesis, due to right MCA stroke in June 2009, right internal carotid artery dissection, from violent vomiting from her migraine, s/p right IC stent.  Continued to suffer from frequent migraine headaches,  Try preventive medications, Trokendi 100 mg daily, 50 mg samples given As needed Fioricet, Cambia, Compazine Return to clinic for repeat EMG guided Botox injection

## 2013-11-29 ENCOUNTER — Telehealth: Payer: Self-pay | Admitting: Neurology

## 2013-11-29 NOTE — Telephone Encounter (Signed)
I called back.

## 2013-11-29 NOTE — Telephone Encounter (Signed)
(  Previous portion of this note closed in error) I spoke with Ruth Gregory.  They reprocessed Rx, and it went through without any issues.  Nothing further is needed at this time.

## 2013-11-29 NOTE — Telephone Encounter (Signed)
Pharmacist Temika at [facility] @ [phone], inquiring about authorization for Cambia.  Please call and advise.

## 2013-12-11 ENCOUNTER — Other Ambulatory Visit: Payer: Self-pay | Admitting: Internal Medicine

## 2013-12-18 ENCOUNTER — Other Ambulatory Visit: Payer: Self-pay | Admitting: Internal Medicine

## 2013-12-18 ENCOUNTER — Telehealth: Payer: Self-pay | Admitting: Neurology

## 2013-12-18 NOTE — Telephone Encounter (Signed)
Patient broke a bone in L hand after fall and may have to get a cast.  Scheduled for Botox injection on 10/7 and gets botox on Left arm.   Questioning if she should get cast.  Please call and advise.

## 2013-12-18 NOTE — Telephone Encounter (Signed)
I have called Ruth Gregory, she has left hand small bone fracture, it is ok for her to keep appt in Oct 7th, 2015,

## 2013-12-18 NOTE — Telephone Encounter (Signed)
Dr. Terrace Arabia please advise about the cast and Botox appointment.

## 2014-01-01 ENCOUNTER — Telehealth: Payer: Self-pay | Admitting: Neurology

## 2014-01-01 ENCOUNTER — Other Ambulatory Visit: Payer: Self-pay | Admitting: Internal Medicine

## 2014-01-01 DIAGNOSIS — Z1231 Encounter for screening mammogram for malignant neoplasm of breast: Secondary | ICD-10-CM

## 2014-01-02 ENCOUNTER — Ambulatory Visit (INDEPENDENT_AMBULATORY_CARE_PROVIDER_SITE_OTHER): Payer: BC Managed Care – PPO | Admitting: Neurology

## 2014-01-02 DIAGNOSIS — G8114 Spastic hemiplegia affecting left nondominant side: Secondary | ICD-10-CM

## 2014-01-02 DIAGNOSIS — G8110 Spastic hemiplegia affecting unspecified side: Secondary | ICD-10-CM

## 2014-01-02 DIAGNOSIS — G43009 Migraine without aura, not intractable, without status migrainosus: Secondary | ICD-10-CM

## 2014-01-02 MED ORDER — ONABOTULINUMTOXINA 100 UNITS IJ SOLR
300.0000 [IU] | Freq: Once | INTRAMUSCULAR | Status: AC
  Administered 2014-01-02: 300 [IU] via INTRAMUSCULAR

## 2014-01-02 NOTE — Progress Notes (Signed)
History of Present Illness: Ruth Gregory is here for EMG guided BOTOX injection.  She has history of right internal carotid artery dissection following an episode of vomiting from migraine headache in mid June 2009. She was given IV TPA and found to have right MCA occlusion. She underwent emergent right carotid artery stent with distal endovascular recanalization of the middle cerebral artery.  She had small hemorrhagic transformation of the right basal ganglia with mild left hemiparesis, but has done well since then and has been independent.  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  She has made marked recovery, ambulating only with very mild difficulty,  maintain majority of her left arm function,  there is mild limitation in the range of motion in her left shoulder,  mild left shoulder stiffness and pain, most bothersome symptoms is her left elbow discomfort, left wrist achy pain, difficulty releasing left hand flexion, left arm persistent flexion,  left ankle plantar inversion, increased gait difficulty after prolonged walking, This has all been helped by Botox injection.  She has been receiving BOTOX injection since 01/2009 q3 months, to her left upper extremity and also left lower extremity, responded well.  Repeat US carotid were normal in October 2013  UPDATE Oct 7th 2015: Previous Botox injection to her left upper, and lower extremity has been very helpful in July 8th 2015,  She responded very well to last BOTOX injection in July 2015, but she fell in December 16 2013, with left hand fracture,require left hand, wrist splint, now has recovered, she also had left ankle sprain, now is recovering,  Her headache has much improved, she responded very well to [employer]  Physical Exam  General: Pleasant Caucasian lady , in no distress.  Afebrile.   Head: nontraumatic Musculoskeletal: no deformity Skin: no rash, petechiae  Neurologic Exam  Mental Status: pleasant, awake, alert,  cooperative to history, talking, and casual conversation. Cranial Nerves: CN II-XII pupils were equal round reactive to light.   Extraocular movements were full.  Visual fields were full on confrontational test.  slight shallow left nasolabial fold.  Hearing was intact to finger rubbing bilaterally.  Uvula tongue were midline.  Head turning and shoulder shrugging were normal and symmetric.  Tongue protrusion into the cheeks strength were normal.  Motor: mild spastic hemiparesis of left arm, tends to stay at left elbow flexion, pronation, mild left shoulder droop, decreased arm swing when ambulating. Mild left finger flexion, weak grip 4, left thumb tends to stay in flexed position.  Left ankle slight limited range of motion, tends to stay in left ankle plantar inversion. Sensory: Normal to light touch, pinprick, proprioception, and vibratory sensation. Coordination: There was no dysmetria noticed. Gait and Station: Narrow based and steady, mild left ankle plantar inversion, decreased left arm swing, slight left shoulder droop Reflexes: Deep tendon reflexes are 2+, hyper reflexia on left side  Plantars are downgoing.     Assessment and Plan:  53 years old female, Spastic left hemiparesis, due to right MCA stroke in June 2009, right internal carotid artery dissection, from violent vomiting from her migraine, s/p right IC stent.   Under electrical stimulations, 300units of BOTOX was used.   Left brachialis 50 Left biceps 50   Left flexor digitorum profundi 12.5  Left pronator 25  Left flexor digitorum superficialis 12.5   Left pectoralis major 25 units  Left teres major 25 units    Left flexor digitorum longus 50 units  Left tibialis posterior 50 units  She will return in 3 months for repeat injection,

## 2014-01-04 ENCOUNTER — Ambulatory Visit: Payer: Self-pay | Admitting: Physician Assistant

## 2014-01-07 ENCOUNTER — Ambulatory Visit: Payer: Self-pay | Admitting: Physician Assistant

## 2014-01-09 ENCOUNTER — Other Ambulatory Visit: Payer: Self-pay | Admitting: *Deleted

## 2014-01-09 MED ORDER — HYDROCODONE-ACETAMINOPHEN 5-325 MG PO TABS: 1.0000 | ORAL_TABLET | ORAL | Status: DC | PRN

## 2014-01-17 ENCOUNTER — Ambulatory Visit (HOSPITAL_COMMUNITY)
Admission: RE | Admit: 2014-01-17 | Discharge: 2014-01-17 | Disposition: A | Discharge status: Home / Self Care | Payer: BC Managed Care – PPO | Source: Ambulatory Visit | Attending: Internal Medicine | Admitting: Internal Medicine

## 2014-01-17 ENCOUNTER — Other Ambulatory Visit: Payer: Self-pay | Admitting: Internal Medicine

## 2014-01-17 DIAGNOSIS — Z1231 Encounter for screening mammogram for malignant neoplasm of breast: Secondary | ICD-10-CM | POA: Diagnosis present

## 2014-01-17 DIAGNOSIS — Z9882 Breast implant status: Secondary | ICD-10-CM | POA: Insufficient documentation

## 2014-01-24 ENCOUNTER — Ambulatory Visit (INDEPENDENT_AMBULATORY_CARE_PROVIDER_SITE_OTHER): Payer: BC Managed Care – PPO | Admitting: Physician Assistant

## 2014-01-24 ENCOUNTER — Encounter: Payer: Self-pay | Admitting: Physician Assistant

## 2014-01-24 VITALS — BP 130/78 | HR 76 | Temp 98.1°F | Resp 16 | Ht 68.5 in | Wt 160.0 lb

## 2014-01-24 DIAGNOSIS — I639 Cerebral infarction, unspecified: Secondary | ICD-10-CM

## 2014-01-24 DIAGNOSIS — E559 Vitamin D deficiency, unspecified: Secondary | ICD-10-CM

## 2014-01-24 DIAGNOSIS — R030 Elevated blood-pressure reading, without diagnosis of hypertension: Secondary | ICD-10-CM

## 2014-01-24 DIAGNOSIS — F909 Attention-deficit hyperactivity disorder, unspecified type: Secondary | ICD-10-CM

## 2014-01-24 DIAGNOSIS — R7303 Prediabetes: Secondary | ICD-10-CM

## 2014-01-24 DIAGNOSIS — Z79899 Other long term (current) drug therapy: Secondary | ICD-10-CM

## 2014-01-24 DIAGNOSIS — Z23 Encounter for immunization: Secondary | ICD-10-CM

## 2014-01-24 DIAGNOSIS — R7309 Other abnormal glucose: Secondary | ICD-10-CM

## 2014-01-24 DIAGNOSIS — E782 Mixed hyperlipidemia: Secondary | ICD-10-CM

## 2014-01-24 DIAGNOSIS — F988 Other specified behavioral and emotional disorders with onset usually occurring in childhood and adolescence: Secondary | ICD-10-CM

## 2014-01-24 LAB — BASIC METABOLIC PANEL WITH GFR
BUN: 17 mg/dL (ref 6–23)
CO2: 22 mEq/L (ref 19–32)
Calcium: 9.3 mg/dL (ref 8.4–10.5)
Chloride: 110 mEq/L (ref 96–112)
Creat: 1.04 mg/dL (ref 0.50–1.10)
GFR, Est African American: 70 mL/min
GFR, Est Non African American: 61 mL/min
Glucose, Bld: 94 mg/dL (ref 70–99)
Potassium: 4.2 mEq/L (ref 3.5–5.3)
Sodium: 142 mEq/L (ref 135–145)

## 2014-01-24 LAB — CBC WITH DIFFERENTIAL/PLATELET
Basophils Absolute: 0 10*3/uL (ref 0.0–0.1)
Basophils Relative: 0 % (ref 0–1)
Eosinophils Absolute: 0.1 10*3/uL (ref 0.0–0.7)
Eosinophils Relative: 2 % (ref 0–5)
HCT: 41.7 % (ref 36.0–46.0)
Hemoglobin: 14.7 g/dL (ref 12.0–15.0)
Lymphocytes Relative: 42 % (ref 12–46)
Lymphs Abs: 1.6 10*3/uL (ref 0.7–4.0)
MCH: 32.2 pg (ref 26.0–34.0)
MCHC: 35.3 g/dL (ref 30.0–36.0)
MCV: 91.2 fL (ref 78.0–100.0)
Monocytes Absolute: 0.3 10*3/uL (ref 0.1–1.0)
Monocytes Relative: 8 % (ref 3–12)
Neutro Abs: 1.8 10*3/uL (ref 1.7–7.7)
Neutrophils Relative %: 48 % (ref 43–77)
Platelets: 204 10*3/uL (ref 150–400)
RBC: 4.57 MIL/uL (ref 3.87–5.11)
RDW: 13.2 % (ref 11.5–15.5)
WBC: 3.7 10*3/uL — ABNORMAL LOW (ref 4.0–10.5)

## 2014-01-24 LAB — LIPID PANEL
Cholesterol: 143 mg/dL (ref 0–200)
HDL: 44 mg/dL (ref >39)
LDL Cholesterol: 62 mg/dL (ref 0–99)
Total CHOL/HDL Ratio: 3.3 Ratio
Triglycerides: 185 mg/dL — ABNORMAL HIGH (ref <150)
VLDL: 37 mg/dL (ref 0–40)

## 2014-01-24 LAB — HEPATIC FUNCTION PANEL
ALT: 40 U/L — ABNORMAL HIGH (ref 0–35)
AST: 29 U/L (ref 0–37)
Albumin: 4.5 g/dL (ref 3.5–5.2)
Alkaline Phosphatase: 85 U/L (ref 39–117)
Bilirubin, Direct: 0.1 mg/dL (ref 0.0–0.3)
Indirect Bilirubin: 0.2 mg/dL (ref 0.2–1.2)
Total Bilirubin: 0.3 mg/dL (ref 0.2–1.2)
Total Protein: 6.7 g/dL (ref 6.0–8.3)

## 2014-01-24 LAB — MAGNESIUM: Magnesium: 1.9 mg/dL (ref 1.5–2.5)

## 2014-01-24 LAB — HEMOGLOBIN A1C
Hgb A1c MFr Bld: 5.3 % (ref <5.7)
Mean Plasma Glucose: 105 mg/dL (ref <117)

## 2014-01-24 LAB — TSH: TSH: 0.55 u[IU]/mL (ref 0.350–4.500)

## 2014-01-24 MED ORDER — AMPHETAMINE-DEXTROAMPHETAMINE 10 MG PO TABS: ORAL_TABLET | ORAL | Status: DC

## 2014-01-24 NOTE — Patient Instructions (Signed)
   Benefiber is good for constipation/diarrhea/irritable bowel syndrome, it helps with weight loss and can help lower your bad cholesterol. Please do 1-2 TBSP in the morning in water, coffee, or tea. It can take up to a month before you can see a difference with your bowel movements. It is cheapest from [employer], [employer], [employer].    

## 2014-01-24 NOTE — Progress Notes (Signed)
Assessment and Plan:  Hypertension: Continue medication, monitor blood pressure at home. Continue DASH diet.  Reminder to go to the ER if any CP, SOB, nausea, dizziness, severe HA, changes vision/speech, left arm numbness and tingling, and jaw pain. Cholesterol: Continue diet and exercise. Check cholesterol.  Pre-diabetes-Continue diet and exercise. Check A1C Vitamin D Def- check level and continue medications. Hypogonadism- continue replacement therapy, check testosterone levels as needed.  ADD-  Continue ADD medication, helps with focus, no AE's. The patient was counseled on the addictive nature of the medication and was encouraged to take drug holidays when not needed.  CVA- Control blood pressure, cholesterol, glucose, increase exercise. Continue follow up with Dr. Terrace ArabiaYan Fracture: last DEXA 2007, suggest getting DEXA, declines at this time, [facility]  Continue diet and meds as discussed. Further disposition pending results of labs.  HPI 53 y.o. female  presents for 3 month follow up with hypertension, hyperlipidemia, prediabetes and vitamin D. Patient has been followed for Migraine and Rt Brain CVA attributed to Rt Carotid Artery dissection with consequent mild Lt spastic hemi-paresis, she sees Dr. Terrace ArabiaYan and is in study currently for pain and continue to get botox injection in her left hand and left leg which she states is helping. She was switched from topamax to topamax ER and she states she is doing better with this, she uses the adderall 10mg  and takes 1-2 a day and it helps with energy/concentration.  She reports that she fell the end of Sept and had a fracture of left MCP, last DEXA was 2007.  Her blood pressure has been controlled at home, today their BP is BP: 130/78 mmHg She does workout. She denies chest pain, shortness of breath, dizziness.  She is on cholesterol medication, crestor 1 tablet every other day and denies myalgias. Her cholesterol is at goal. The  cholesterol last visit was:   Lab Results  Component Value Date   CHOL 181 10/03/2013   HDL 66 10/03/2013   LDLCALC 91 10/03/2013   TRIG 120 10/03/2013   CHOLHDL 2.7 10/03/2013   She has been working on diet and exercise for prediabetes, and denies paresthesia of the feet, polydipsia and polyuria. Last A1C in the office was:  Lab Results  Component Value Date   HGBA1C 5.7* 10/03/2013   Patient is on Vitamin D supplement.   Lab Results  Component Value Date   VD25OH 74 10/03/2013     Wt Readings from Last 3 Encounters:  01/24/14 160 lb (72.576 kg)  11/27/13 160 lb (72.576 kg)  10/03/13 160 lb 6.4 oz (72.757 kg)    Current Medications:  Current Outpatient Prescriptions on File Prior to Visit  Medication Sig Dispense Refill  . ALPRAZolam (XANAX) 1 MG tablet TAKE 1/2 - 1 TABLET BY MOUTH 3 TIMES DAILY AS NEEDED FOR ANXIETY  90 tablet  0  . amphetamine-dextroamphetamine (ADDERALL) 10 MG tablet Take 1/2 to 1 tablet 1 or 2 x daily for ADD  90 tablet  0  . aspirin 325 MG EC tablet Take 325 mg by mouth daily.      Marland Kitchen. buPROPion (WELLBUTRIN XL) 300 MG 24 hr tablet TAKE 1 TABLET BY MOUTH EVERY DAY WITH FOOD  90 tablet  1  . butalbital-acetaminophen-caffeine (FIORICET, ESGIC) 50-325-40 MG per tablet Take 1 tablet by mouth every 6 (six) hours as needed for headache.  30 tablet  3  . cetirizine (ZYRTEC) 10 MG tablet Take 10 mg by mouth daily.      .Marland Kitchen  clindamycin (CLEOCIN) 300 MG capsule 300 mg daily.      . Diclofenac Potassium 50 MG PACK Take 50 mg by mouth as needed.  30 each  11  . ESTRING 2 MG vaginal ring Place 2 mg vaginally every 3 (three) months.       . fenofibrate micronized (LOFIBRA) 134 MG capsule Take 134 mg by mouth daily.      . Flaxseed, Linseed, (FLAX SEEDS PO) Take 1 capsule by mouth daily.      Marland Kitchen. HYDROcodone-acetaminophen (NORCO/VICODIN) 5-325 MG per tablet Take 1 tablet by mouth every 4 (four) hours as needed for moderate pain.  30 tablet  0  . levothyroxine (SYNTHROID, LEVOTHROID) 100  MCG tablet TAKE 1 TABLET BY MOUTH EVERY DAY  90 tablet  3  . magnesium oxide (MAG-OX) 400 MG tablet Take 400 mg by mouth 2 (two) times daily.       . Multiple Vitamin (MULTIVITAMIN WITH MINERALS) TABS tablet Take 1 tablet by mouth daily.      Marland Kitchen. omeprazole (PRILOSEC) 40 MG capsule Take 1 capsule (40 mg total) by mouth daily. For acid indigestion and reflux  30 capsule  99  . predniSONE (DELTASONE) 20 MG tablet TAKE 1 TABLET BY MOUTH EVERY DAY AS NEEDED  30 tablet  1  . prochlorperazine (COMPAZINE) 5 MG tablet TAKE 1 TABLET 3 TIMES A DAY AS NEEDED FOR NAUSEA  50 tablet  1  . PROVENTIL HFA 108 (90 BASE) MCG/ACT inhaler Inhale 2 puffs into the lungs every 6 (six) hours as needed for wheezing or shortness of breath.       . rosuvastatin (CRESTOR) 20 MG tablet Take 20 mg by mouth every other day.       . topiramate (TOPAMAX) 50 MG tablet Take 150 mg by mouth daily.       . Topiramate ER (TROKENDI XR) 100 MG CP24 Take 100 mg by mouth at bedtime.  30 capsule  11  . traMADol (ULTRAM) 50 MG tablet Take 1 tablet (50 mg total) by mouth every 6 (six) hours as needed.  50 tablet  0  . TROKENDI XR 50 MG CP24 Take 50 mg by mouth at bedtime.  7 capsule  0  . vitamin B-12 (CYANOCOBALAMIN) 1000 MCG tablet Take 1,000 mcg by mouth daily.      . Vitamin D, Ergocalciferol, (DRISDOL) 50000 UNITS CAPS capsule TAKE ONE CAPSULE BY MOUTH DAILY  30 capsule  PRN   No current facility-administered medications on file prior to visit.   Medical History:  Past Medical History  Diagnosis Date  . Anxiety   . Spasm of muscle   . Occlusion and stenosis of carotid artery with cerebral infarction   . Headache(784.0)   . Unspecified cerebral artery occlusion with cerebral infarction   . Elevated cholesterol     on Crestor  . Depression   . Hypothyroidism     S/P thyroidectomy  . Asthma   . Chronic heartburn   . Hypertension   . Anemia   . Migraine   . Vitamin D deficiency    Allergies:  Allergies  Allergen Reactions   . Penicillins Anaphylaxis and Hives  . Imitrex [Sumatriptan]   . Other Swelling    kiwi  . Sulfa Antibiotics Hives  . Zomig [Zolmitriptan]      Review of Systems: see HPI Family history- Review and unchanged Social history- Review and unchanged Physical Exam: BP 130/78  Pulse 76  Temp(Src) 98.1 F (36.7 C)  Resp  16  Wt 160 lb (72.576 kg) Wt Readings from Last 3 Encounters:  01/24/14 160 lb (72.576 kg)  11/27/13 160 lb (72.576 kg)  10/03/13 160 lb 6.4 oz (72.757 kg)   General Appearance: Well nourished, in no apparent distress. Eyes: PERRLA, EOMs, conjunctiva no swelling or erythema, normal fundi and vessels. Sinuses: No frontal/maxillary tenderness ENT/Mouth: EACs patent / TMs  nl. Nares clear without erythema, swelling, mucoid exudates. Oral hygiene is good. No erythema, swelling, or exudate. Tongue normal, non-obstructing. Tonsils not swollen or erythematous. Hearing normal.  Neck: Supple, thyroid normal. No bruits, nodes or JVD. Respiratory: Respiratory effort normal.  BS equal and clear bilateral without rales, rhonci, wheezing or stridor. Cardio: Heart sounds are normal with regular rate and rhythm and no murmurs, rubs or gallops. Peripheral pulses are normal and equal bilaterally without edema. No aortic or femoral bruits. Chest: symmetric with normal excursions and percussion. Breasts: Symmetric, without lumps, nipple discharge, retractions, or fibrocystic changes.  Abdomen: Flat, soft, with bowl sounds. Nontender, no guarding, rebound, hernias, masses, or organomegaly.  Lymphatics: Non tender without lymphadenopathy.  Musculoskeletal:  Mild spastic Lt HP and flexion type contracturing of the Lt wrist and hand. Otherwise full ROM of the Rt extremities, with normal joint stability, strength on the Rt  and  gait is normal  Skin: Warm and dry without rashes, lesions, cyanosis, clubbing or  ecchymosis.  Neuro: Cranial nerves intact, reflexes slightly increased on the Left and  muscle tone likewise sl. Increased on the Left. No cerebellar symptoms. Sensation intact.  Pysch: Alert and oriented X 3, normal affect.    Quentin Mulling, PA-C 11:07 AM [facility]

## 2014-01-25 LAB — VITAMIN D 25 HYDROXY (VIT D DEFICIENCY, FRACTURES): Vit D, 25-Hydroxy: 116 ng/mL — ABNORMAL HIGH (ref 30–89)

## 2014-01-29 ENCOUNTER — Other Ambulatory Visit: Payer: Self-pay | Admitting: Physician Assistant

## 2014-02-26 ENCOUNTER — Other Ambulatory Visit: Payer: Self-pay

## 2014-02-26 MED ORDER — BUPROPION HCL ER (XL) 300 MG PO TB24: 300.0000 mg | ORAL_TABLET | Freq: Every day | ORAL | Status: DC

## 2014-03-11 ENCOUNTER — Encounter: Payer: Self-pay | Admitting: Physician Assistant

## 2014-03-11 ENCOUNTER — Ambulatory Visit (INDEPENDENT_AMBULATORY_CARE_PROVIDER_SITE_OTHER): Payer: BC Managed Care – PPO | Admitting: Physician Assistant

## 2014-03-11 VITALS — BP 140/88 | HR 72 | Temp 98.2°F | Resp 16 | Ht 68.5 in | Wt 155.0 lb

## 2014-03-11 DIAGNOSIS — G43009 Migraine without aura, not intractable, without status migrainosus: Secondary | ICD-10-CM

## 2014-03-11 DIAGNOSIS — G4700 Insomnia, unspecified: Secondary | ICD-10-CM

## 2014-03-11 DIAGNOSIS — R0683 Snoring: Secondary | ICD-10-CM

## 2014-03-11 DIAGNOSIS — J320 Chronic maxillary sinusitis: Secondary | ICD-10-CM

## 2014-03-11 MED ORDER — PREDNISONE 20 MG PO TABS: ORAL_TABLET | ORAL | Status: DC

## 2014-03-11 MED ORDER — HYDROCODONE-ACETAMINOPHEN 5-325 MG PO TABS: 1.0000 | ORAL_TABLET | ORAL | Status: DC | PRN

## 2014-03-11 NOTE — Progress Notes (Signed)
Subjective:    Patient ID: Ruth Gregory, female    DOB: 1960-08-26, 53 y.o.   MRN: [id number]  HPI  A Caucasian 53yo female presents to office due to going to ENT doctor recently and them prescribing Clindamycin 300mg - 1 tablet PO TID for 10 days and take with food for chronic sinusitis.  Patient states last Friday she was having migraine and dark green mucus and sinus pressure.  She states she also went to the eye doctor and per patient "has scratches on both corneas from allergies" and is taking prednisone drops for her eyes.  She states she only took 11 out of 30 capsules of Clindamycin.  She states she wanted our opinion and if she should be taking Clindamycin.  She states she has been rubbing her nose especially the left nostril due to the ENT putting a hole in her left maxillary sinus to help it drain.  She currently has a sore on her left outer nostril and she has been putting lotion on it.  She states she was never told about the side effects of Clindamycin with CDiff.  She has been eating yogurt with honey every day.  Patient states that her neurologist told her to not take prednisone for headaches and sinuses.  Patient was given Cambia 50mg  (diclofenac potassium for oral solution) for headaches.  Patient states this does not work for severe headaches.  She states Dr. Kathryne Sharper treatment works the best for severe headaches.  Patient states she only take Xanax, Adderall, Prednisone and Vicodin when needed.  She states the Adderall is helping her focus since her stroke.  She states she only needs a refill of Vicodin.   Last seen by Neurologist on 11/27/13 and was given Botox injection for spastic left hemiparesis due to stroke in June 2009.  Also taking Fioricet, Cambia and Compazine per Dr. Zannie Cove note.  GFR= 61 on 01/24/14 Review of Systems  Constitutional: Negative.   HENT: Positive for congestion, postnasal drip, rhinorrhea and sinus pressure. Negative for ear discharge, ear pain, sore throat,  trouble swallowing and voice change.   Eyes: Negative.   Respiratory: Negative.   Cardiovascular: Negative.   Gastrointestinal: Negative.   Genitourinary: Negative.   Skin: Negative.        Except for sore on left nostril due to using tissues.  Allergic/Immunologic: Positive for environmental allergies.  Neurological: Positive for headaches. Negative for dizziness and light-headedness.  Psychiatric/Behavioral: Positive for sleep disturbance. The patient is nervous/anxious.        Stress due to mother-in-law in hospital.   Past Medical History  Diagnosis Date  . Anxiety   . Spasm of muscle   . Occlusion and stenosis of carotid artery with cerebral infarction   . Headache(784.0)   . Unspecified cerebral artery occlusion with cerebral infarction   . Elevated cholesterol     on Crestor  . Depression   . Hypothyroidism     S/P thyroidectomy  . Asthma   . Chronic heartburn   . Hypertension   . Anemia   . Migraine   . Vitamin D deficiency    Current Outpatient Prescriptions on File Prior to Visit  Medication Sig Dispense Refill  . ALPRAZolam (XANAX) 1 MG tablet TAKE 1/2 - 1 TABLET BY MOUTH 3 TIMES DAILY AS NEEDED FOR ANXIETY 90 tablet 0  . amphetamine-dextroamphetamine (ADDERALL) 10 MG tablet Take 1/2 to 1 tablet 1 or 2 x daily for ADD 90 tablet 0  . aspirin 325 MG EC tablet Take  325 mg by mouth daily.    Marland Kitchen. buPROPion (WELLBUTRIN XL) 300 MG 24 hr tablet Take 1 tablet (300 mg total) by mouth daily. with food 90 tablet 3  . butalbital-acetaminophen-caffeine (FIORICET, ESGIC) 50-325-40 MG per tablet Take 1 tablet by mouth every 6 (six) hours as needed for headache. 30 tablet 3  . cetirizine (ZYRTEC) 10 MG tablet Take 10 mg by mouth daily.    . Diclofenac Potassium 50 MG PACK Take 50 mg by mouth as needed. 30 each 11  . ESTRING 2 MG vaginal ring Place 2 mg vaginally every 3 (three) months.     . fenofibrate micronized (LOFIBRA) 134 MG capsule Take 134 mg by mouth daily.    . Flaxseed,  Linseed, (FLAX SEEDS PO) Take 1 capsule by mouth daily.    Marland Kitchen. HYDROcodone-acetaminophen (NORCO/VICODIN) 5-325 MG per tablet Take 1 tablet by mouth every 4 (four) hours as needed for moderate pain. 30 tablet 0  . levothyroxine (SYNTHROID, LEVOTHROID) 100 MCG tablet TAKE 1 TABLET BY MOUTH EVERY DAY 90 tablet 3  . magnesium oxide (MAG-OX) 400 MG tablet Take 400 mg by mouth 2 (two) times daily.     . Multiple Vitamin (MULTIVITAMIN WITH MINERALS) TABS tablet Take 1 tablet by mouth daily.    Marland Kitchen. omeprazole (PRILOSEC) 40 MG capsule Take 1 capsule (40 mg total) by mouth daily. For acid indigestion and reflux 30 capsule 99  . predniSONE (DELTASONE) 20 MG tablet TAKE 1 TABLET BY MOUTH EVERY DAY AS NEEDED 30 tablet 1  . prochlorperazine (COMPAZINE) 5 MG tablet TAKE 1 TABLET 3 TIMES A DAY AS NEEDED FOR NAUSEA 50 tablet 1  . PROVENTIL HFA 108 (90 BASE) MCG/ACT inhaler Inhale 2 puffs into the lungs every 6 (six) hours as needed for wheezing or shortness of breath.     . rosuvastatin (CRESTOR) 20 MG tablet Take 20 mg by mouth every other day.     . Topiramate ER (TROKENDI XR) 100 MG CP24 Take 100 mg by mouth at bedtime. 30 capsule 11  . traMADol (ULTRAM) 50 MG tablet Take 1 tablet (50 mg total) by mouth every 6 (six) hours as needed. 50 tablet 0  . vitamin B-12 (CYANOCOBALAMIN) 1000 MCG tablet Take 1,000 mcg by mouth daily.    . Vitamin D, Ergocalciferol, (DRISDOL) 50000 UNITS CAPS capsule TAKE ONE CAPSULE BY MOUTH DAILY 30 capsule PRN  . clindamycin (CLEOCIN) 300 MG capsule 300 mg daily.     No current facility-administered medications on file prior to visit.   Allergies  Allergen Reactions  . Penicillins Anaphylaxis and Hives  . Imitrex [Sumatriptan]   . Other Swelling    kiwi  . Sulfa Antibiotics Hives  . Zomig [Zolmitriptan]      BP 140/88 mmHg  Pulse 72  Temp(Src) 98.2 F (36.8 C) (Temporal)  Resp 16  Ht 5' 8.5" (1.74 m)  Wt 155 lb (70.308 kg)  BMI 23.22 kg/m2  SpO2 98% Wt Readings from Last 3  Encounters:  03/11/14 155 lb (70.308 kg)  01/24/14 160 lb (72.576 kg)  11/27/13 160 lb (72.576 kg)   Objective:   Physical Exam  Constitutional: She is oriented to person, place, and time. She appears well-developed and well-nourished. She has a sickly appearance. No distress.  HENT:  Head: Normocephalic.  Right Ear: Tympanic membrane, external ear and ear canal normal.  Left Ear: Tympanic membrane, external ear and ear canal normal.  Nose: Mucosal edema and rhinorrhea present. Right sinus exhibits maxillary sinus tenderness and  frontal sinus tenderness. Left sinus exhibits maxillary sinus tenderness and frontal sinus tenderness.  Mouth/Throat: Uvula is midline, oropharynx is clear and moist and mucous membranes are normal. Mucous membranes are not pale and not dry. No trismus in the jaw. No uvula swelling. No oropharyngeal exudate, posterior oropharyngeal edema, posterior oropharyngeal erythema or tonsillar abscesses.    Turbinates erythematous bilaterally.   Eyes: Conjunctivae and lids are normal. Pupils are equal, round, and reactive to light. Right eye exhibits no discharge. Left eye exhibits no discharge. No scleral icterus.  Neck: Trachea normal, normal range of motion and phonation normal. Neck supple. No tracheal tenderness present. No tracheal deviation present.  Cardiovascular: Normal rate, regular rhythm, S1 normal, S2 normal, normal heart sounds and normal pulses.  Exam reveals no gallop, no distant heart sounds and no friction rub.   No murmur heard. Pulmonary/Chest: Effort normal and breath sounds normal. No stridor. No respiratory distress. She has no decreased breath sounds. She has no wheezes. She has no rhonchi. She has no rales. She exhibits no tenderness.  Abdominal: Soft. Bowel sounds are normal. There is no tenderness. There is no rebound and no guarding.  Lymphadenopathy:  No tenderness or LAD.  Neurological: She is alert and oriented to person, place, and time. Gait  normal.  Skin: Skin is warm, dry and intact. No rash noted. She is not diaphoretic.  Psychiatric: She has a normal mood and affect. Her speech is normal and behavior is normal. Judgment and thought content normal. Cognition and memory are normal.  Vitals reviewed.  Assessment & Plan:  1. Nonintractable migraine, unspecified migraine type Gave patient refill of Norco/Vicodin today as needed for chronic migraines.  2. Chronic maxillary sinusitis -Continue Clindamycin 300mg  - 1 tablet TID for 10 days as prescribed by ENT doctor.  Take with Probiotic and yogurt daily.  Told patient to go to ED immediately if she starts having diarrhea. -Take Prednisone as prescribed for inflammation- predniSONE (DELTASONE) 20 MG tablet; Take 3 tablets PO QDaily for 3 days, then take 2 tablets PO QDaily for 3 days, then take 1 tablet PO QDaily for 3 days  Dispense: 18 tablet; Refill: 1 -Use Vaseline for nose sore daily.  3. Insomnia and Snoring - Ambulatory referral to Sleep Studies   Discussed medication effects and SE's.  Pt agreed to treatment plan. If you are not feeling better in 10-14 days, then please call the office. Please keep your physical appt on 04/08/14.  Setareh Rom, Lise AuerJennifer L, PA-C 9:51 AM [facility]

## 2014-03-11 NOTE — Patient Instructions (Signed)
-Take clindamycin as prescribed for sinus infection.  Take with food and take probiotic OTC daily and yogurt. -Continue Zyrtec daily. -Use Vaseline on left nostril.  If you are not feeling better in 10-14 days, then please call office.  Please keep your physical on 04/08/14.   Clostridium Difficile Infection Clostridium difficile (C. difficile) is a bacteria found in the intestinal tract or colon. Under certain conditions, it causes diarrhea and sometimes severe disease. The severe form of the disease is known as pseudomembranous colitis (often called C. difficile colitis). This disease can damage the lining of the colon or cause the colon to become enlarged (toxic megacolon). CAUSES Your colon normally contains many different bacteria, including C. difficile. The balance of bacteria in your colon can change during illness. This is especially true when you take antibiotic medicine. Taking antibiotics may allow the C. difficile to grow, multiply excessively, and make a toxin that then causes illness. The elderly and people with certain medical conditions have a greater risk of getting C. difficile infections. SYMPTOMS  Watery diarrhea.  Fever.  Fatigue.  Loss of appetite.  Nausea.  Abdominal swelling, pain, or tenderness.  Dehydration. DIAGNOSIS Your symptoms may make your caregiver suspect a C. difficile infection, especially if you have used antibiotics in the preceding weeks. However, there are only 2 ways to know for certain whether you have a C. difficile infection:  A lab test that finds the toxin in your stool.  The specific appearance of an abnormality (pseudomembrane) in your colon. This can only be seen by doing a sigmoidoscopy or colonoscopy. These procedures involve passing an instrument through your rectum to look at the inside of your colon. Your caregiver will help determine if these tests are necessary. TREATMENT  Most people are successfully treated with one of two  specific antibiotics, usually given by mouth. Other antibiotics you are receiving are stopped if possible.  Intravenous (IV) fluids and correction of electrolyte imbalance may be necessary.  Rarely, surgery may be needed to remove the infected part of the intestines.  Careful hand washing by you and your caregivers is important to prevent the spread of infection. In the hospital, your caregivers may also put on gowns and gloves to prevent the spread of the C. difficile bacteria. Your room is also cleaned regularly with a solution containing bleach or a product that is known to kill C. difficile. HOME CARE INSTRUCTIONS  Drink enough fluids to keep your urine clear or pale yellow. Avoid milk, caffeine, and alcohol.  Ask your caregiver for specific rehydration instructions.  Try eating small, frequent meals rather than large meals.  Take your antibiotics as directed. Finish them even if you start to feel better.  Do not use medicines to slow diarrhea. This could delay healing or cause complications.  Wash your hands thoroughly after using the bathroom and before preparing food.  Make sure people who live with you wash their hands often, too.  Carefully disinfect all surfaces with a product that contains chlorine bleach. SEEK MEDICAL CARE IF:  Diarrhea persists longer than expected or recurs after completing your course of antibiotic treatment for the C. difficile infection.  You have trouble staying hydrated. SEEK IMMEDIATE MEDICAL CARE IF:  You develop a new fever.  You have increasing abdominal pain or tenderness.  There is blood in your stools, or your stools are dark black and tarry.  You cannot hold down food or liquids. MAKE SURE YOU:  Understand these instructions.  Will watch your condition.  Will get help right away if you are not doing well or get worse. Document Released: 12/23/2004 Document Revised: 07/30/2013 Document Reviewed: 08/21/2010 [facility] Patient  Information 2015 [place], [place]. This information is not intended to replace advice given to you by your health care provider. Make sure you discuss any questions you have with your health care provider.    Sinusitis Sinusitis is redness, soreness, and inflammation of the paranasal sinuses. Paranasal sinuses are air pockets within the bones of your face (beneath the eyes, the middle of the forehead, or above the eyes). In healthy paranasal sinuses, mucus is able to drain out, and air is able to circulate through them by way of your nose. However, when your paranasal sinuses are inflamed, mucus and air can become trapped. This can allow bacteria and other germs to grow and cause infection. Sinusitis can develop quickly and last only a short time (acute) or continue over a long period (chronic). Sinusitis that lasts for more than 12 weeks is considered chronic.  CAUSES  Causes of sinusitis include:  Allergies.  Structural abnormalities, such as displacement of the cartilage that separates your nostrils (deviated septum), which can decrease the air flow through your nose and sinuses and affect sinus drainage.  Functional abnormalities, such as when the small hairs (cilia) that line your sinuses and help remove mucus do not work properly or are not present. SIGNS AND SYMPTOMS  Symptoms of acute and chronic sinusitis are the same. The primary symptoms are pain and pressure around the affected sinuses. Other symptoms include:  Upper toothache.  Earache.  Headache.  Bad breath.  Decreased sense of smell and taste.  A cough, which worsens when you are lying flat.  Fatigue.  Fever.  Thick drainage from your nose, which often is green and may contain pus (purulent).  Swelling and warmth over the affected sinuses. DIAGNOSIS  Your health care provider will perform a physical exam. During the exam, your health care provider may:  Look in your nose for signs of abnormal growths in your  nostrils (nasal polyps).  Tap over the affected sinus to check for signs of infection.  View the inside of your sinuses (endoscopy) using an imaging device that has a light attached (endoscope). If your health care provider suspects that you have chronic sinusitis, one or more of the following tests may be recommended:  Allergy tests.  Nasal culture. A sample of mucus is taken from your nose, sent to a lab, and screened for bacteria.  Nasal cytology. A sample of mucus is taken from your nose and examined by your health care provider to determine if your sinusitis is related to an allergy. TREATMENT  Most cases of acute sinusitis are related to a viral infection and will resolve on their own within 10 days. Sometimes medicines are prescribed to help relieve symptoms (pain medicine, decongestants, nasal steroid sprays, or saline sprays).  However, for sinusitis related to a bacterial infection, your health care provider will prescribe antibiotic medicines. These are medicines that will help kill the bacteria causing the infection.  Rarely, sinusitis is caused by a fungal infection. In theses cases, your health care provider will prescribe antifungal medicine. For some cases of chronic sinusitis, surgery is needed. Generally, these are cases in which sinusitis recurs more than 3 times per year, despite other treatments. HOME CARE INSTRUCTIONS   Drink plenty of water. Water helps thin the mucus so your sinuses can drain more easily.  Use a humidifier.  Inhale steam 3  to 4 times a day (for example, sit in the bathroom with the shower running).  Apply a warm, moist washcloth to your face 3 to 4 times a day, or as directed by your health care provider.  Use saline nasal sprays to help moisten and clean your sinuses.  Take medicines only as directed by your health care provider.  If you were prescribed either an antibiotic or antifungal medicine, finish it all even if you start to feel  better. SEEK IMMEDIATE MEDICAL CARE IF:  You have increasing pain or severe headaches.  You have nausea, vomiting, or drowsiness.  You have swelling around your face.  You have vision problems.  You have a stiff neck.  You have difficulty breathing. MAKE SURE YOU:   Understand these instructions.  Will watch your condition.  Will get help right away if you are not doing well or get worse. Document Released: 03/15/2005 Document Revised: 07/30/2013 Document Reviewed: 03/30/2011 [facility] Patient Information 2015 [place], [place]. This information is not intended to replace advice given to you by your health care provider. Make sure you discuss any questions you have with your health care provider.

## 2014-03-26 ENCOUNTER — Other Ambulatory Visit: Payer: Self-pay | Admitting: Internal Medicine

## 2014-03-26 ENCOUNTER — Other Ambulatory Visit: Payer: Self-pay | Admitting: Physician Assistant

## 2014-04-01 ENCOUNTER — Other Ambulatory Visit: Payer: Self-pay | Admitting: Internal Medicine

## 2014-04-03 ENCOUNTER — Ambulatory Visit: Payer: BC Managed Care – PPO | Admitting: Neurology

## 2014-04-05 ENCOUNTER — Telehealth: Payer: Self-pay | Admitting: *Deleted

## 2014-04-05 NOTE — Telephone Encounter (Signed)
Patient would like to schedule a Botox appt.

## 2014-04-05 NOTE — Telephone Encounter (Signed)
LMVM pt needs to contact billing and pay No Show fee before rescheduling appointment

## 2014-04-06 ENCOUNTER — Other Ambulatory Visit: Payer: Self-pay | Admitting: Internal Medicine

## 2014-04-08 ENCOUNTER — Encounter: Payer: Self-pay | Admitting: Internal Medicine

## 2014-04-08 ENCOUNTER — Ambulatory Visit (INDEPENDENT_AMBULATORY_CARE_PROVIDER_SITE_OTHER): Payer: BLUE CROSS/BLUE SHIELD | Admitting: Internal Medicine

## 2014-04-08 ENCOUNTER — Other Ambulatory Visit: Payer: Self-pay | Admitting: Internal Medicine

## 2014-04-08 VITALS — BP 126/84 | HR 88 | Temp 97.7°F | Resp 16 | Ht 68.75 in | Wt 156.8 lb

## 2014-04-08 DIAGNOSIS — I639 Cerebral infarction, unspecified: Secondary | ICD-10-CM

## 2014-04-08 DIAGNOSIS — Z79899 Other long term (current) drug therapy: Secondary | ICD-10-CM

## 2014-04-08 DIAGNOSIS — Z1212 Encounter for screening for malignant neoplasm of rectum: Secondary | ICD-10-CM

## 2014-04-08 DIAGNOSIS — G8114 Spastic hemiplegia affecting left nondominant side: Secondary | ICD-10-CM

## 2014-04-08 DIAGNOSIS — R5383 Other fatigue: Secondary | ICD-10-CM

## 2014-04-08 DIAGNOSIS — G8110 Spastic hemiplegia affecting unspecified side: Secondary | ICD-10-CM

## 2014-04-08 DIAGNOSIS — E782 Mixed hyperlipidemia: Secondary | ICD-10-CM

## 2014-04-08 DIAGNOSIS — G43101 Migraine with aura, not intractable, with status migrainosus: Secondary | ICD-10-CM

## 2014-04-08 DIAGNOSIS — R7309 Other abnormal glucose: Secondary | ICD-10-CM

## 2014-04-08 DIAGNOSIS — F988 Other specified behavioral and emotional disorders with onset usually occurring in childhood and adolescence: Secondary | ICD-10-CM

## 2014-04-08 DIAGNOSIS — E559 Vitamin D deficiency, unspecified: Secondary | ICD-10-CM

## 2014-04-08 DIAGNOSIS — R7303 Prediabetes: Secondary | ICD-10-CM

## 2014-04-08 DIAGNOSIS — F909 Attention-deficit hyperactivity disorder, unspecified type: Secondary | ICD-10-CM

## 2014-04-08 DIAGNOSIS — R030 Elevated blood-pressure reading, without diagnosis of hypertension: Secondary | ICD-10-CM

## 2014-04-08 LAB — CBC WITH DIFFERENTIAL/PLATELET
Basophils Absolute: 0 10*3/uL (ref 0.0–0.1)
Basophils Relative: 0 % (ref 0–1)
Eosinophils Absolute: 0.1 10*3/uL (ref 0.0–0.7)
Eosinophils Relative: 2 % (ref 0–5)
HCT: 44.9 % (ref 36.0–46.0)
Hemoglobin: 15.2 g/dL — ABNORMAL HIGH (ref 12.0–15.0)
Lymphocytes Relative: 35 % (ref 12–46)
Lymphs Abs: 1.9 10*3/uL (ref 0.7–4.0)
MCH: 31.5 pg (ref 26.0–34.0)
MCHC: 33.9 g/dL (ref 30.0–36.0)
MCV: 93.2 fL (ref 78.0–100.0)
MPV: 10.6 fL (ref 8.6–12.4)
Monocytes Absolute: 0.4 10*3/uL (ref 0.1–1.0)
Monocytes Relative: 7 % (ref 3–12)
Neutro Abs: 3 10*3/uL (ref 1.7–7.7)
Neutrophils Relative %: 56 % (ref 43–77)
Platelets: 228 10*3/uL (ref 150–400)
RBC: 4.82 MIL/uL (ref 3.87–5.11)
RDW: 13.2 % (ref 11.5–15.5)
WBC: 5.3 10*3/uL (ref 4.0–10.5)

## 2014-04-08 NOTE — Progress Notes (Signed)
Patient ID: Ruth Gregory, female   DOB: 06/24/1960, 54 y.o.   MRN: [id number]  Annual Comprehensive Examination  This very nice 54 y.o.MWF presents for complete physical.  Patient has been followed for HTN, T2_NIDDM  Prediabetes, Hyperlipidemia, and Vitamin D Deficiency.   Labile  HTN predates since 2008. In June 2009 , she had a Right MCA CVA attributed to a ? 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 Marland Kitchen. Patient's BP has been controlled at home and she denies any cardiac symptoms as chest pain, palpitations, shortness of breath, dizziness or ankle swelling. Today's BP: 126/84 mmHg    Patient's hyperlipidemia has been controlled with diet and medications, but she relates being off of her Crestor x 3 weeks. Patient denies myalgias or other medication SE's. Last lipids were at goal - Total Chol 143; HDL  44; LDL  62; Trig 185 on 01/24/2014.   Patient is screened for prediabetes predating since July 2015 when her A1c was 5.7% in early prediabetic range and patient denies reactive hypoglycemic symptoms, visual blurring, diabetic polys, or paresthesias. Last A1c was 5.3% on 01/24/2014.   Finally, patient has history of Vitamin D Deficiency of 23 in July 2008 and last Vitamin D was 116 on 01/24/2014 and dose was tapered.  Medication Sig  . ALPRAZolam (XANAX) 1 MG tablet TAKE 1 TABLET BY MOUTH 3 TIMES A DAY AS NEEDED  . amphetamine-dextroamphetamine (ADDERALL) 10 MG tablet Take 1/2 to 1 tablet 1 or 2 x daily for ADD  . aspirin 325 MG EC tablet Take 325 mg by mouth daily.  Marland Kitchen. buPROPion (WELLBUTRIN XL) 300 MG 24 hr tablet Take 1 tablet (300 mg total) by mouth daily. with food  . butalbital-acetaminophen-caffeine (FIORICET, ESGIC) 50-325-40 MG per tablet Take 1 tablet by mouth every 6 (six) hours as needed for headache.  .  cetirizine (ZYRTEC) 10 MG tablet Take 10 mg by mouth daily.  . clindamycin (CLEOCIN) 300 MG capsule 300 mg daily.  . CRESTOR 40 MG tablet TAKE 1 TABLET BY MOUTH EVERY DAY  . Diclofenac Potassium 50 MG PACK Take 50 mg by mouth as needed.  Marland Kitchen. ESTRING 2 MG vaginal ring Place 2 mg vaginally every 3 (three) months.   . fenofibrate micronized (LOFIBRA) 134 MG capsule TAKE 1 CAPSULE BY MOUTH EVERY DAY  . Flaxseed, Linseed, (FLAX SEEDS PO) Take 1 capsule by mouth daily.  Marland Kitchen. HYDROcodone-acetaminophen (NORCO/VICODIN) 5-325 MG per tablet Take 1 tablet by mouth every 4 (four) hours as needed for moderate pain.  Marland Kitchen. levothyroxine (SYNTHROID, LEVOTHROID) 100 MCG tablet TAKE 1 TABLET BY MOUTH EVERY DAY  . magnesium oxide (MAG-OX) 400 MG tablet Take 400 mg by mouth 2 (two) times daily.   . Multiple Vitamin (MULTIVITAMIN WITH MINERALS) TABS tablet Take 1 tablet by mouth daily.  Marland Kitchen. omeprazole (PRILOSEC) 40 MG capsule TAKE 1 CAPSULE (40 MG TOTAL) BY MOUTH DAILY. FOR ACID INDIGESTION AND REFLUX  . prednisoLONE acetate (PRED FORTE) 1 % ophthalmic suspension   . predniSONE (DELTASONE) 20 MG tablet Take 3 tablets PO QDaily for 3 days, then take 2 tablets PO QDaily for 3 days, then take 1 tablet PO QDaily for 3 days  . prochlorperazine (COMPAZINE) 5 MG tablet TAKE 1 TABLET 3 TIMES A DAY AS NEEDED FOR NAUSEA  . PROVENTIL HFA 108 (90 BASE) MCG/ACT inhaler  Inhale 2 puffs into the lungs every 6 (six) hours as needed for wheezing or shortness of breath.   . rosuvastatin (CRESTOR) 20 MG tablet Take 20 mg by mouth every other day.   . Topiramate ER (TROKENDI XR) 100 MG CP24 Take 100 mg by mouth at bedtime.  . traMADol (ULTRAM) 50 MG tablet Take 1 tablet (50 mg total) by mouth every 6 (six) hours as needed.  . vitamin B-12 (CYANOCOBALAMIN) 1000 MCG tablet Take 1,000 mcg by mouth daily.  . Vitamin D, Ergocalciferol, (DRISDOL) 50000 UNITS CAPS capsule TAKE ONE CAPSULE BY MOUTH DAILY   Allergies  Allergen Reactions  . Penicillins  Anaphylaxis and Hives  . Imitrex [Sumatriptan]   . Other Swelling    kiwi  . Sulfa Antibiotics Hives  . Zomig [Zolmitriptan]    Past Medical History  Diagnosis Date  . Anxiety   . Spasm of muscle   . Occlusion and stenosis of carotid artery with cerebral infarction   . Headache(784.0)   . Unspecified cerebral artery occlusion with cerebral infarction   . Elevated cholesterol     on Crestor  . Depression   . Hypothyroidism     S/P thyroidectomy  . Asthma   . Chronic heartburn   . Hypertension   . Anemia   . Migraine   . Vitamin D deficiency    Health Maintenance  Topic Date Due  . PAP SMEAR  11/02/1976  . COLONOSCOPY  11/02/2008  . TETANUS/TDAP  03/29/2012  . INFLUENZA VACCINE  10/28/2014  . MAMMOGRAM  01/04/2015   Immunization History  Administered Date(s) Administered  . Influenza Split 01/24/2014  . Influenza-Unspecified 11/28/2011  . PPD Test 04/04/2013  . Pneumococcal Polysaccharide-23 03/29/1998  . Td 03/29/2002   Past Surgical History  Procedure Laterality Date  . Vaginal hysterectomy    . Nasal sinus surgery    . Cystocele repair N/A 01/08/2013    Procedure: ANTERIOR REPAIR (CYSTOCELE);  Surgeon: Meriel Pica, MD;  Location: [facility];  Service: Gynecology;  Laterality: N/A;  . Cosmetic surgery     Family History  Problem Relation Age of Onset  . Heart disease Mother   . Peptic Ulcer Disease Mother   . Heart disease Father   . Parkinson's disease Father    History  Substance Use Topics  . Smoking status: Never Smoker   . Smokeless tobacco: Never Used  . Alcohol Use: 0.0 oz/week     Comment: occassionally drinks a glass of wine    ROS Constitutional: Denies fever, chills, weight loss/gain, headaches, insomnia, fatigue, night sweats, and change in appetite. Eyes: Denies redness, blurred vision, diplopia, discharge, itchy, watery eyes.  ENT: Denies discharge, congestion, post nasal drip, epistaxis, sore throat, earache, hearing loss, dental  pain, Tinnitus, Vertigo, Sinus pain, snoring.  Cardio: Denies chest pain, palpitations, irregular heartbeat, syncope, dyspnea, diaphoresis, orthopnea, PND, claudication, edema Respiratory: denies cough, dyspnea, DOE, pleurisy, hoarseness, laryngitis, wheezing.  Gastrointestinal: Denies dysphagia, heartburn, reflux, water brash, pain, cramps, nausea, vomiting, bloating, diarrhea, constipation, hematemesis, melena, hematochezia, jaundice, hemorrhoids Genitourinary: Denies dysuria, frequency, urgency, nocturia, hesitancy, discharge, hematuria, flank pain Breast: Breast lumps, nipple discharge, bleeding.  Musculoskeletal: Denies arthralgia, myalgia, stiffness, Jt. Swelling, pain, limp, and strain/sprain. Denies falls. Skin: Denies puritis, rash, hives, warts, acne, eczema, changing in skin lesion Neuro: No weakness, tremor, incoordination, spasms, paresthesia, pain Psychiatric: Denies confusion, memory loss, sensory loss. Denies Depression. Endocrine: Denies change in weight, skin, hair change, nocturia, and paresthesia, diabetic polys, visual blurring, hyper / hypo  glycemic episodes.  Heme/Lymph: No excessive bleeding, bruising, enlarged lymph nodes.  Physical Exam  BP 126/84   Pulse 88  Temp 97.7 F   Resp 16  Ht 5' 8.75"   Wt 156 lb 12.8 oz         BMI 23.33   General Appearance: Well nourished and in no apparent distress. Eyes: PERRLA, EOMs, conjunctiva no swelling or erythema, normal fundi and vessels. Sinuses: No frontal/maxillary tenderness ENT/Mouth: EACs patent / TMs  nl. Nares clear without erythema, swelling, mucoid exudates. Oral hygiene is good. No erythema, swelling, or exudate. Tongue normal, non-obstructing. Tonsils not swollen or erythematous. Hearing normal.  Neck: Supple, thyroid normal. No bruits, nodes or JVD. Respiratory: Respiratory effort normal.  BS equal and clear bilateral without rales, rhonci, wheezing or stridor. Cardio: Heart sounds are normal with regular rate  and rhythm and no murmurs, rubs or gallops. Peripheral pulses are normal and equal bilaterally without edema. No aortic or femoral bruits. Chest: symmetric with normal excursions and percussion. Breasts: Deferred to GYN. Abdomen: Flat, soft, with bowl sounds. Nontender, no guarding, rebound, hernias, masses, or organomegaly.  Lymphatics: Non tender without lymphadenopathy.  Genitourinary: deferred to GYN. Musculoskeletal: Full ROM all peripheral extremities, joint stability, 5/5 strength and normal gait. Skin: Warm and dry without rashes, lesions, cyanosis, clubbing or  ecchymosis.  Neuro: Cranial nerves intact, mild LUE hyperreflexia and increased tone. No cerebellar symptoms. Sensation intact.  Pysch: Awake and oriented X 3, normal affect, Insight and Judgment appropriate.   Assessment and Plan  1. Elevated BP, hx/o 2. CVA w/spastic left HP 3. Migraine 4. Hyperlipidemia 5. Pre Diabetes 6. Vitamin D Deficiency   Continue prudent diet as discussed, weight control, BP monitoring, regular exercise, and medications. Discussed med's effects and SE's. Screening labs and tests as requested with regular follow-up as recommended.

## 2014-04-08 NOTE — Patient Instructions (Signed)
Recommend the book "The END of DIETING" by Dr Baker Janus   and the book "The END of DIABETES " by Dr Excell Seltzer  At [email or web address] - get book & Audio CD's      Being diabetic has a  300% increased risk for heart attack, stroke, cancer, and alzheimer- type vascular dementia. It is very important that you work harder with diet by avoiding all foods that are white except chicken & fish. Avoid white rice (brown & wild rice is OK), white potatoes (sweetpotatoes in moderation is OK), White bread or wheat bread or anything made out of white flour like bagels, donuts, rolls, buns, biscuits, cakes, pastries, cookies, pizza crust, and pasta (made from white flour & egg whites) - vegetarian pasta or spinach or wheat pasta is OK. Multigrain breads like Arnold's or [employer], or multigrain sandwich thins or flatbreads.  Diet, exercise and weight loss can reverse and cure diabetes in the early stages.  Diet, exercise and weight loss is very important in the control and prevention of complications of diabetes which affects every system in your body, ie. Brain - dementia/stroke, eyes - glaucoma/blindness, heart - heart attack/heart failure, kidneys - dialysis, stomach - gastric paralysis, intestines - malabsorption, nerves - severe painful neuritis, circulation - gangrene & loss of a leg(s), and finally cancer and Alzheimers.    I recommend avoid fried & greasy foods,  sweets/candy, white rice (brown or wild rice or Quinoa is OK), white potatoes (sweet potatoes are OK) - anything made from white flour - bagels, doughnuts, rolls, buns, biscuits,white and wheat breads, pizza crust and traditional pasta made of white flour & egg white(vegetarian pasta or spinach or wheat pasta is OK).  Multi-grain bread is OK - like multi-grain flat bread or sandwich thins. Avoid alcohol in excess. Exercise is also important.    Eat all the vegetables you want - avoid meat, especially red meat and dairy - especially cheese.  Cheese  is the most concentrated form of trans-fats which is the worst thing to clog up our arteries. Veggie cheese is OK which can be found in the fresh produce section at [employer] or [employer] or [employer]  Preventive Care for Adults A healthy lifestyle and preventive care can promote health and wellness. Preventive health guidelines for women include the following key practices.  A routine yearly physical is a good way to check with your health care provider about your health and preventive screening. It is a chance to share any concerns and updates on your health and to receive a thorough exam.  Visit your dentist for a routine exam and preventive care every 6 months. Brush your teeth twice a day and floss once a day. Good oral hygiene prevents tooth decay and gum disease.  The frequency of eye exams is based on your age, health, family medical history, use of contact lenses, and other factors. Follow your health care provider's recommendations for frequency of eye exams.  Eat a healthy diet. Foods like vegetables, fruits, whole grains, low-fat dairy products, and lean protein foods contain the nutrients you need without too many calories. Decrease your intake of foods high in solid fats, added sugars, and salt. Eat the right amount of calories for you.Get information about a proper diet from your health care provider, if necessary.  Regular physical exercise is one of the most important things you can do for your health. Most adults should get at least 150 minutes of moderate-intensity exercise (any activity that increases  your heart rate and causes you to sweat) each week. In addition, most adults need muscle-strengthening exercises on 2 or more days a week.  Maintain a healthy weight. The body mass index (BMI) is a screening tool to identify possible weight problems. It provides an estimate of body fat based on height and weight. Your health care provider can find your BMI and can help you  achieve or maintain a healthy weight.For adults 20 years and older:  A BMI below 18.5 is considered underweight.  A BMI of 18.5 to 24.9 is normal.  A BMI of 25 to 29.9 is considered overweight.  A BMI of 30 and above is considered obese.  Maintain normal blood lipids and cholesterol levels by exercising and minimizing your intake of saturated fat. Eat a balanced diet with plenty of fruit and vegetables. Blood tests for lipids and cholesterol should begin at age 38 and be repeated every 5 years. If your lipid or cholesterol levels are high, you are over 50, or you are at high risk for heart disease, you may need your cholesterol levels checked more frequently.Ongoing high lipid and cholesterol levels should be treated with medicines if diet and exercise are not working.  If you smoke, find out from your health care provider how to quit. If you do not use tobacco, do not start.  Lung cancer screening is recommended for adults aged 64-80 years who are at high risk for developing lung cancer because of a history of smoking. A yearly low-dose CT scan of the lungs is recommended for people who have at least a 30-pack-year history of smoking and are a current smoker or have quit within the past 15 years. A pack year of smoking is smoking an average of 1 pack of cigarettes a day for 1 year (for example: 1 pack a day for 30 years or 2 packs a day for 15 years). Yearly screening should continue until the smoker has stopped smoking for at least 15 years. Yearly screening should be stopped for people who develop a health problem that would prevent them from having lung cancer treatment.  High blood pressure causes heart disease and increases the risk of stroke. Your blood pressure should be checked at least every 1 to 2 years. Ongoing high blood pressure should be treated with medicines if weight loss and exercise do not work.  If you are 79-57 years old, ask your health care provider if you should take  aspirin to prevent strokes.  Diabetes screening involves taking a blood sample to check your fasting blood sugar level. This should be done once every 3 years, after age 37, if you are within normal weight and without risk factors for diabetes. Testing should be considered at a younger age or be carried out more frequently if you are overweight and have at least 1 risk factor for diabetes.  Breast cancer screening is essential preventive care for women. You should practice "breast self-awareness." This means understanding the normal appearance and feel of your breasts and may include breast self-examination. Any changes detected, no matter how small, should be reported to a health care provider. Women in their 76s and 30s should have a clinical breast exam (CBE) by a health care provider as part of a regular health exam every 1 to 3 years. After age 55, women should have a CBE every year. Starting at age 79, women should consider having a mammogram (breast X-ray test) every year. Women who have a family history of breast  cancer should talk to their health care provider about genetic screening. Women at a high risk of breast cancer should talk to their health care providers about having an MRI and a mammogram every year.  Breast cancer gene (BRCA)-related cancer risk assessment is recommended for women who have family members with BRCA-related cancers. BRCA-related cancers include breast, ovarian, tubal, and peritoneal cancers. Having family members with these cancers may be associated with an increased risk for harmful changes (mutations) in the breast cancer genes BRCA1 and BRCA2. Results of the assessment will determine the need for genetic counseling and BRCA1 and BRCA2 testing.  Routine pelvic exams to screen for cancer are no longer recommended for nonpregnant women who are considered low risk for cancer of the pelvic organs (ovaries, uterus, and vagina) and who do not have symptoms. Ask your health  care provider if a screening pelvic exam is right for you.  If you have had past treatment for cervical cancer or a condition that could lead to cancer, you need Pap tests and screening for cancer for at least 20 years after your treatment. If Pap tests have been discontinued, your risk factors (such as having a new sexual partner) need to be reassessed to determine if screening should be resumed. Some women have medical problems that increase the chance of getting cervical cancer. In these cases, your health care provider may recommend more frequent screening and Pap tests.  Colorectal cancer can be detected and often prevented. Most routine colorectal cancer screening begins at the age of 58 years and continues through age 44 years. However, your health care provider may recommend screening at an earlier age if you have risk factors for colon cancer. On a yearly basis, your health care provider may provide home test kits to check for hidden blood in the stool. Use of a small camera at the end of a tube, to directly examine the colon (sigmoidoscopy or colonoscopy), can detect the earliest forms of colorectal cancer. Talk to your health care provider about this at age 84, when routine screening begins. Direct exam of the colon should be repeated every 5-10 years through age 33 years, unless early forms of pre-cancerous polyps or small growths are found.  Hepatitis C blood testing is recommended for all people born from [birth year] through 1965 and any individual with known risks for hepatitis C.  Pra  Osteoporosis is a disease in which the bones lose minerals and strength with aging. This can result in serious bone fractures or breaks. The risk of osteoporosis can be identified using a bone density scan. Women ages 36 years and over and women at risk for fractures or osteoporosis should discuss screening with their health care providers. Ask your health care provider whether you should take a calcium supplement  or vitamin D to reduce the rate of osteoporosis.  Menopause can be associated with physical symptoms and risks. Hormone replacement therapy is available to decrease symptoms and risks. You should talk to your health care provider about whether hormone replacement therapy is right for you.  Use sunscreen. Apply sunscreen liberally and repeatedly throughout the day. You should seek shade when your shadow is shorter than you. Protect yourself by wearing long sleeves, pants, a wide-brimmed hat, and sunglasses year round, whenever you are outdoors.  Once a month, do a whole body skin exam, using a mirror to look at the skin on your back. Tell your health care provider of new moles, moles that have irregular borders, moles that are  larger than a pencil eraser, or moles that have changed in shape or color.  Stay current with required vaccines (immunizations).  Influenza vaccine. All adults should be immunized every year.  Tetanus, diphtheria, and acellular pertussis (Td, Tdap) vaccine. Pregnant women should receive 1 dose of Tdap vaccine during each pregnancy. The dose should be obtained regardless of the length of time since the last dose. Immunization is preferred during the 27th-36th week of gestation. An adult who has not previously received Tdap or who does not know her vaccine status should receive 1 dose of Tdap. This initial dose should be followed by tetanus and diphtheria toxoids (Td) booster doses every 10 years. Adults with an unknown or incomplete history of completing a 3-dose immunization series with Td-containing vaccines should begin or complete a primary immunization series including a Tdap dose. Adults should receive a Td booster every 10 years.  Varicella vaccine. An adult without evidence of immunity to varicella should receive 2 doses or a second dose if she has previously received 1 dose. Pregnant females who do not have evidence of immunity should receive the first dose after  pregnancy. This first dose should be obtained before leaving the health care facility. The second dose should be obtained 4-8 weeks after the first dose.  Human papillomavirus (HPV) vaccine. Females aged 13-26 years who have not received the vaccine previously should obtain the 3-dose series. The vaccine is not recommended for use in pregnant females. However, pregnancy testing is not needed before receiving a dose. If a female is found to be pregnant after receiving a dose, no treatment is needed. In that case, the remaining doses should be delayed until after the pregnancy. Immunization is recommended for any person with an immunocompromised condition through the age of 26 years if she did not get any or all doses earlier. During the 3-dose series, the second dose should be obtained 4-8 weeks after the first dose. The third dose should be obtained 24 weeks after the first dose and 16 weeks after the second dose.  Zoster vaccine. One dose is recommended for adults aged 60 years or older unless certain conditions are present.  Measles, mumps, and rubella (MMR) vaccine. Adults born before 1957 generally are considered immune to measles and mumps. Adults born in 1957 or later should have 1 or more doses of MMR vaccine unless there is a contraindication to the vaccine or there is laboratory evidence of immunity to each of the three diseases. A routine second dose of MMR vaccine should be obtained at least 28 days after the first dose for students attending postsecondary schools, health care workers, or international travelers. People who received inactivated measles vaccine or an unknown type of measles vaccine during 1963-1967 should receive 2 doses of MMR vaccine. People who received inactivated mumps vaccine or an unknown type of mumps vaccine before 1979 and are at high risk for mumps infection should consider immunization with 2 doses of MMR vaccine. For females of childbearing age, rubella immunity should  be determined. If there is no evidence of immunity, females who are not pregnant should be vaccinated. If there is no evidence of immunity, females who are pregnant should delay immunization until after pregnancy. Unvaccinated health care workers born before 1957 who lack laboratory evidence of measles, mumps, or rubella immunity or laboratory confirmation of disease should consider measles and mumps immunization with 2 doses of MMR vaccine or rubella immunization with 1 dose of MMR vaccine.  Pneumococcal 13-valent conjugate (PCV13) vaccine. When   indicated, a person who is uncertain of her immunization history and has no record of immunization should receive the PCV13 vaccine. An adult aged 73 years or older who has certain medical conditions and has not been previously immunized should receive 1 dose of PCV13 vaccine. This PCV13 should be followed with a dose of pneumococcal polysaccharide (PPSV23) vaccine. The PPSV23 vaccine dose should be obtained at least 8 weeks after the dose of PCV13 vaccine. An adult aged 81 years or older who has certain medical conditions and previously received 1 or more doses of PPSV23 vaccine should receive 1 dose of PCV13. The PCV13 vaccine dose should be obtained 1 or more years after the last PPSV23 vaccine dose.    Pneumococcal polysaccharide (PPSV23) vaccine. When PCV13 is also indicated, PCV13 should be obtained first. All adults aged 69 years and older should be immunized. An adult younger than age 35 years who has certain medical conditions should be immunized. Any person who resides in a nursing home or long-term care facility should be immunized. An adult smoker should be immunized. People with an immunocompromised condition and certain other conditions should receive both PCV13 and PPSV23 vaccines. People with human immunodeficiency virus (HIV) infection should be immunized as soon as possible after diagnosis. Immunization during chemotherapy or radiation therapy should  be avoided. Routine use of PPSV23 vaccine is not recommended for American Indians, [place] Natives, or people younger than 65 years unless there are medical conditions that require PPSV23 vaccine. When indicated, people who have unknown immunization and have no record of immunization should receive PPSV23 vaccine. One-time revaccination 5 years after the first dose of PPSV23 is recommended for people aged 19-64 years who have chronic kidney failure, nephrotic syndrome, asplenia, or immunocompromised conditions. People who received 1-2 doses of PPSV23 before age 79 years should receive another dose of PPSV23 vaccine at age 73 years or later if at least 5 years have passed since the previous dose. Doses of PPSV23 are not needed for people immunized with PPSV23 at or after age 19 years.  Preventive Services / Frequency   Ages 25 to 65 years  Blood pressure check.  Lipid and cholesterol check.  Lung cancer screening. / Every year if you are aged 8-80 years and have a 30-pack-year history of smoking and currently smoke or have quit within the past 15 years. Yearly screening is stopped once you have quit smoking for at least 15 years or develop a health problem that would prevent you from having lung cancer treatment.  Clinical breast exam.** / Every year after age 28 years.  BRCA-related cancer risk assessment.** / For women who have family members with a BRCA-related cancer (breast, ovarian, tubal, or peritoneal cancers).  Mammogram.** / Every year beginning at age 57 years and continuing for as long as you are in good health. Consult with your health care provider.  Pap test.** / Every 3 years starting at age 57 years through age 25 or 13 years with a history of 3 consecutive normal Pap tests.  HPV screening.** / Every 3 years from ages 21 years through ages 60 to 48 years with a history of 3 consecutive normal Pap tests.  Fecal occult blood test (FOBT) of stool. / Every year beginning at age 77  years and continuing until age 57 years. You may not need to do this test if you get a colonoscopy every 10 years.  Flexible sigmoidoscopy or colonoscopy.** / Every 5 years for a flexible sigmoidoscopy or every 10 years  for a colonoscopy beginning at age 104 years and continuing until age 39 years.  Hepatitis C blood test.** / For all people born from [birth year] through 1965 and any individual with known risks for hepatitis C.  Skin self-exam. / Monthly.  Influenza vaccine. / Every year.  Tetanus, diphtheria, and acellular pertussis (Tdap/Td) vaccine.** / Consult your health care provider. Pregnant women should receive 1 dose of Tdap vaccine during each pregnancy. 1 dose of Td every 10 years.  Varicella vaccine.** / Consult your health care provider. Pregnant females who do not have evidence of immunity should receive the first dose after pregnancy.  Zoster vaccine.** / 1 dose for adults aged 47 years or older.  Pneumococcal 13-valent conjugate (PCV13) vaccine.** / Consult your health care provider.  Pneumococcal polysaccharide (PPSV23) vaccine.** / 1 to 2 doses if you smoke cigarettes or if you have certain conditions.  Meningococcal vaccine.** / Consult your health care provider.  Hepatitis A vaccine.** / Consult your health care provider.  Hepatitis B vaccine.** / Consult your health care provider. Screening for abdominal aortic aneurysm (AAA)  by ultrasound is recommended for people over 50 who have history of high blood pressure or who are current or former smokers.

## 2014-04-09 DIAGNOSIS — Z0289 Encounter for other administrative examinations: Secondary | ICD-10-CM

## 2014-04-09 LAB — BASIC METABOLIC PANEL WITH GFR
BUN: 21 mg/dL (ref 6–23)
CO2: 26 mEq/L (ref 19–32)
Calcium: 10 mg/dL (ref 8.4–10.5)
Chloride: 106 mEq/L (ref 96–112)
Creat: 1.11 mg/dL — ABNORMAL HIGH (ref 0.50–1.10)
GFR, Est African American: 65 mL/min
GFR, Est Non African American: 56 mL/min — ABNORMAL LOW
Glucose, Bld: 78 mg/dL (ref 70–99)
Potassium: 4.1 mEq/L (ref 3.5–5.3)
Sodium: 140 mEq/L (ref 135–145)

## 2014-04-09 LAB — URINALYSIS, ROUTINE W REFLEX MICROSCOPIC
Bilirubin Urine: NEGATIVE
Glucose, UA: NEGATIVE mg/dL
Hgb urine dipstick: NEGATIVE
Ketones, ur: NEGATIVE mg/dL
Leukocytes, UA: NEGATIVE
Nitrite: NEGATIVE
Protein, ur: NEGATIVE mg/dL
Specific Gravity, Urine: 1.014 (ref 1.005–1.030)
Urobilinogen, UA: 0.2 mg/dL (ref 0.0–1.0)
pH: 7 (ref 5.0–8.0)

## 2014-04-09 LAB — VITAMIN B12: Vitamin B-12: 548 pg/mL (ref 211–911)

## 2014-04-09 LAB — HEPATIC FUNCTION PANEL
ALT: 26 U/L (ref 0–35)
AST: 19 U/L (ref 0–37)
Albumin: 4.3 g/dL (ref 3.5–5.2)
Alkaline Phosphatase: 73 U/L (ref 39–117)
Bilirubin, Direct: 0.1 mg/dL (ref 0.0–0.3)
Indirect Bilirubin: 0.5 mg/dL (ref 0.2–1.2)
Total Bilirubin: 0.6 mg/dL (ref 0.2–1.2)
Total Protein: 6.9 g/dL (ref 6.0–8.3)

## 2014-04-09 LAB — HEMOGLOBIN A1C
Hgb A1c MFr Bld: 5.7 % — ABNORMAL HIGH (ref <5.7)
Mean Plasma Glucose: 117 mg/dL — ABNORMAL HIGH (ref <117)

## 2014-04-09 LAB — MICROALBUMIN / CREATININE URINE RATIO
Creatinine, Urine: 78.8 mg/dL
Microalb Creat Ratio: 2.5 mg/g (ref 0.0–30.0)
Microalb, Ur: 0.2 mg/dL (ref <2.0)

## 2014-04-09 LAB — IRON AND TIBC
%SAT: 62 % — ABNORMAL HIGH (ref 20–55)
Iron: 176 ug/dL — ABNORMAL HIGH (ref 42–145)
TIBC: 285 ug/dL (ref 250–470)
UIBC: 109 ug/dL — ABNORMAL LOW (ref 125–400)

## 2014-04-09 LAB — LIPID PANEL
Cholesterol: 226 mg/dL — ABNORMAL HIGH (ref 0–200)
HDL: 57 mg/dL (ref >39)
LDL Cholesterol: 120 mg/dL — ABNORMAL HIGH (ref 0–99)
Total CHOL/HDL Ratio: 4 Ratio
Triglycerides: 243 mg/dL — ABNORMAL HIGH (ref <150)
VLDL: 49 mg/dL — ABNORMAL HIGH (ref 0–40)

## 2014-04-09 LAB — MAGNESIUM: Magnesium: 2 mg/dL (ref 1.5–2.5)

## 2014-04-09 LAB — VITAMIN D 25 HYDROXY (VIT D DEFICIENCY, FRACTURES): Vit D, 25-Hydroxy: 99 ng/mL (ref 30–100)

## 2014-04-09 LAB — INSULIN, FASTING: Insulin fasting, serum: 15.1 u[IU]/mL (ref 2.0–19.6)

## 2014-04-09 LAB — TSH: TSH: 1.581 u[IU]/mL (ref 0.350–4.500)

## 2014-04-10 ENCOUNTER — Encounter: Payer: Self-pay | Admitting: Neurology

## 2014-04-10 ENCOUNTER — Ambulatory Visit (INDEPENDENT_AMBULATORY_CARE_PROVIDER_SITE_OTHER): Payer: BLUE CROSS/BLUE SHIELD | Admitting: Neurology

## 2014-04-10 DIAGNOSIS — G8110 Spastic hemiplegia affecting unspecified side: Secondary | ICD-10-CM

## 2014-04-10 DIAGNOSIS — G8114 Spastic hemiplegia affecting left nondominant side: Secondary | ICD-10-CM

## 2014-04-10 DIAGNOSIS — G43101 Migraine with aura, not intractable, with status migrainosus: Secondary | ICD-10-CM

## 2014-04-10 NOTE — Progress Notes (Signed)
History of Present Illness: Ms Hiltz is here for EMG guided BOTOX injection.  She has history of right internal carotid artery dissection following an episode of vomiting from migraine headache in mid June 2009. She was given IV TPA and found to have right MCA occlusion. She underwent emergent right carotid artery stent with distal endovascular recanalization of the middle cerebral artery.  She had small hemorrhagic transformation of the right basal ganglia with mild left hemiparesis, but has done well since then and has been independent.  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  She has been receiving BOTOX injection since 01/2009 q3 months, to her left upper extremity and also left lower extremity, responded well.  Repeat US carotid were normal in October 2013  UPDATE January 2016: She responded very well to last BOTOX injection in October 2015, she now complains of increased left foot discomfort, left ankle plantar flexion, also left anterior shin tenderness upon deep palpation   Physical Exam  General: Pleasant Caucasian lady , in no distress.  Afebrile.   Head: nontraumatic Musculoskeletal: no deformity Skin: no rash, petechiae  Neurologic Exam  Mental Status: pleasant, awake, alert, cooperative to history, talking, and casual conversation. Cranial Nerves: CN II-XII pupils were equal round reactive to light.   Extraocular movements were full.  Visual fields were full on  confrontational test.  slight shallow left nasolabial fold.  Hearing was intact to finger rubbing bilaterally.  Uvula tongue were midline.  Head turning and shoulder shrugging were normal and symmetric.  Tongue protrusion into the cheeks strength were normal.  Motor: mild spastic hemiparesis of left arm, tends to stay at left elbow flexion, pronation, mild left shoulder droop, decreased arm swing when ambulating. Mild left finger flexion, weak grip 4, left thumb tends to stay in flexed position.  Left ankle slight limited range of motion, tends to stay in left ankle plantar inversion, with left toe flexion. Sensory: Normal to light touch, pinprick, proprioception, and vibratory sensation. Coordination: There was no dysmetria noticed. Gait and Station: Narrow based and steady, mild left ankle plantar inversion, decreased left arm swing, slight left shoulder droop Reflexes: Deep tendon reflexes are 2+, hyper reflexia on left side  Plantars are downgoing.     Assessment and Plan:  54 years old female, Spastic left hemiparesis, due to right MCA stroke in June 2009, right internal carotid artery dissection, from violent vomiting from her migraine, s/p right IC stent.   Under electrical stimulations, 300units of BOTOX was used.   Left brachialis 25  Left flexor digitorum profundi 12.5  Left pronator 25  Left flexor digitorum superficialis 12.5   Left pectoralis major 25 units  Left teres major 25 units  Left levator scapular 25 units   Left flexor digitorum longus 50 units  Left tibialis posterior 50 units  Left flexor digitorum brevis 25 units Left medial gastrocnemius 25 units  She will return in 3 months for repeat injection, she has mild tenderness alone left tibialis bone, I have advised her  hot compression, massage  Levert FeinsteinYijun Makeila Yamaguchi, M.D. Ph.D.  [facility] [address] [place], [place] [address] Phone: [phone] Fax:      [phone]

## 2014-04-11 MED ORDER — ONABOTULINUMTOXINA 100 UNITS IJ SOLR
300.0000 [IU] | Freq: Once | INTRAMUSCULAR | Status: AC
  Administered 2014-04-11: 300 [IU] via INTRAMUSCULAR

## 2014-05-07 ENCOUNTER — Other Ambulatory Visit: Payer: Self-pay | Admitting: Obstetrics and Gynecology

## 2014-05-08 ENCOUNTER — Other Ambulatory Visit: Payer: Self-pay | Admitting: *Deleted

## 2014-05-08 DIAGNOSIS — Z1212 Encounter for screening for malignant neoplasm of rectum: Secondary | ICD-10-CM

## 2014-05-08 LAB — POC HEMOCCULT BLD/STL (HOME/3-CARD/SCREEN)
Card #2 Fecal Occult Blod, POC: NEGATIVE
Card #3 Fecal Occult Blood, POC: NEGATIVE
Fecal Occult Blood, POC: NEGATIVE

## 2014-05-08 LAB — CYTOLOGY - PAP

## 2014-05-09 ENCOUNTER — Other Ambulatory Visit: Payer: Self-pay | Admitting: Physician Assistant

## 2014-05-09 MED ORDER — AMPHETAMINE-DEXTROAMPHETAMINE 10 MG PO TABS: ORAL_TABLET | ORAL | Status: DC

## 2014-05-10 ENCOUNTER — Other Ambulatory Visit: Payer: Self-pay | Admitting: Physician Assistant

## 2014-06-05 ENCOUNTER — Other Ambulatory Visit: Payer: Self-pay | Admitting: Internal Medicine

## 2014-06-05 MED ORDER — HYDROCODONE-ACETAMINOPHEN 5-325 MG PO TABS: 1.0000 | ORAL_TABLET | ORAL | Status: DC | PRN

## 2014-06-20 ENCOUNTER — Telehealth: Payer: Self-pay | Admitting: *Deleted

## 2014-06-20 DIAGNOSIS — Z0289 Encounter for other administrative examinations: Secondary | ICD-10-CM

## 2014-06-20 NOTE — Telephone Encounter (Signed)
[place] form from [employer] [place] [facility] [address].

## 2014-06-21 ENCOUNTER — Other Ambulatory Visit: Payer: Self-pay | Admitting: Physician Assistant

## 2014-07-03 ENCOUNTER — Telehealth: Payer: Self-pay | Admitting: *Deleted

## 2014-07-03 NOTE — Telephone Encounter (Signed)
Patient checking status of disability form. Please call and advise.

## 2014-07-03 NOTE — Telephone Encounter (Signed)
This form was just completed and returned to medical records in the afternoon on 07/02/14.  Need to make sure medical records has had the time to document and scan to chart.

## 2014-07-03 NOTE — Telephone Encounter (Signed)
R

## 2014-07-03 NOTE — Telephone Encounter (Signed)
Patient form from [employer] [place]  at the front desk ready for pickup.

## 2014-07-12 ENCOUNTER — Ambulatory Visit (INDEPENDENT_AMBULATORY_CARE_PROVIDER_SITE_OTHER): Payer: BLUE CROSS/BLUE SHIELD | Admitting: *Deleted

## 2014-07-12 DIAGNOSIS — R300 Dysuria: Secondary | ICD-10-CM

## 2014-07-12 DIAGNOSIS — R350 Frequency of micturition: Secondary | ICD-10-CM

## 2014-07-12 MED ORDER — CIPROFLOXACIN HCL 250 MG PO TABS: 250.0000 mg | ORAL_TABLET | Freq: Two times a day (BID) | ORAL | Status: AC

## 2014-07-12 NOTE — Progress Notes (Signed)
Patient ID: Ruth Gregory, female   DOB: 06/25/1960, 54 y.o.   MRN: [id number] Patient presents today with c/o dysuria, burning with urination and increased urine frequency times 1 day.  Per Dr. Kathryne SharperMckeown's orders UA and C&S will be checked today and Cipro rx sent into pharmacy for patient to start.  Advised we may need to change rx after receiving UC results.

## 2014-07-13 LAB — URINALYSIS, ROUTINE W REFLEX MICROSCOPIC
Bilirubin Urine: NEGATIVE
Glucose, UA: NEGATIVE mg/dL
Hgb urine dipstick: NEGATIVE
Ketones, ur: NEGATIVE mg/dL
Nitrite: NEGATIVE
Protein, ur: NEGATIVE mg/dL
Specific Gravity, Urine: 1.012 (ref 1.005–1.030)
Urobilinogen, UA: 0.2 mg/dL (ref 0.0–1.0)
pH: 7 (ref 5.0–8.0)

## 2014-07-13 LAB — URINALYSIS, MICROSCOPIC ONLY
Bacteria, UA: NONE SEEN
Casts: NONE SEEN
Crystals: NONE SEEN
Squamous Epithelial / LPF: NONE SEEN

## 2014-07-14 ENCOUNTER — Other Ambulatory Visit: Payer: Self-pay | Admitting: Internal Medicine

## 2014-07-14 LAB — URINE CULTURE: Colony Count: 35000

## 2014-07-22 ENCOUNTER — Encounter: Payer: Self-pay | Admitting: Physician Assistant

## 2014-07-22 ENCOUNTER — Ambulatory Visit (INDEPENDENT_AMBULATORY_CARE_PROVIDER_SITE_OTHER): Payer: BLUE CROSS/BLUE SHIELD | Admitting: Physician Assistant

## 2014-07-22 VITALS — BP 128/78 | HR 76 | Temp 97.9°F | Resp 16 | Ht 68.75 in | Wt 162.0 lb

## 2014-07-22 DIAGNOSIS — G43809 Other migraine, not intractable, without status migrainosus: Secondary | ICD-10-CM

## 2014-07-22 DIAGNOSIS — G43101 Migraine with aura, not intractable, with status migrainosus: Secondary | ICD-10-CM

## 2014-07-22 DIAGNOSIS — R7303 Prediabetes: Secondary | ICD-10-CM

## 2014-07-22 DIAGNOSIS — E039 Hypothyroidism, unspecified: Secondary | ICD-10-CM

## 2014-07-22 DIAGNOSIS — E782 Mixed hyperlipidemia: Secondary | ICD-10-CM

## 2014-07-22 DIAGNOSIS — R7309 Other abnormal glucose: Secondary | ICD-10-CM

## 2014-07-22 DIAGNOSIS — R030 Elevated blood-pressure reading, without diagnosis of hypertension: Secondary | ICD-10-CM

## 2014-07-22 DIAGNOSIS — J309 Allergic rhinitis, unspecified: Secondary | ICD-10-CM

## 2014-07-22 DIAGNOSIS — E559 Vitamin D deficiency, unspecified: Secondary | ICD-10-CM

## 2014-07-22 DIAGNOSIS — Z79899 Other long term (current) drug therapy: Secondary | ICD-10-CM

## 2014-07-22 LAB — CBC WITH DIFFERENTIAL/PLATELET
Basophils Absolute: 0 10*3/uL (ref 0.0–0.1)
Basophils Relative: 0 % (ref 0–1)
Eosinophils Absolute: 0.1 10*3/uL (ref 0.0–0.7)
Eosinophils Relative: 2 % (ref 0–5)
HCT: 40.2 % (ref 36.0–46.0)
Hemoglobin: 13.6 g/dL (ref 12.0–15.0)
Lymphocytes Relative: 35 % (ref 12–46)
Lymphs Abs: 2.1 10*3/uL (ref 0.7–4.0)
MCH: 31.4 pg (ref 26.0–34.0)
MCHC: 33.8 g/dL (ref 30.0–36.0)
MCV: 92.8 fL (ref 78.0–100.0)
MPV: 10.8 fL (ref 8.6–12.4)
Monocytes Absolute: 0.4 10*3/uL (ref 0.1–1.0)
Monocytes Relative: 7 % (ref 3–12)
Neutro Abs: 3.3 10*3/uL (ref 1.7–7.7)
Neutrophils Relative %: 56 % (ref 43–77)
Platelets: 183 10*3/uL (ref 150–400)
RBC: 4.33 MIL/uL (ref 3.87–5.11)
RDW: 13.3 % (ref 11.5–15.5)
WBC: 5.9 10*3/uL (ref 4.0–10.5)

## 2014-07-22 LAB — HEMOGLOBIN A1C
Hgb A1c MFr Bld: 5.8 % — ABNORMAL HIGH (ref <5.7)
Mean Plasma Glucose: 120 mg/dL — ABNORMAL HIGH (ref <117)

## 2014-07-22 MED ORDER — AMPHETAMINE-DEXTROAMPHETAMINE 10 MG PO TABS: ORAL_TABLET | ORAL | Status: DC

## 2014-07-22 MED ORDER — PROCHLORPERAZINE MALEATE 5 MG PO TABS: ORAL_TABLET | ORAL | Status: DC

## 2014-07-22 MED ORDER — MONTELUKAST SODIUM 10 MG PO TABS: 10.0000 mg | ORAL_TABLET | Freq: Every day | ORAL | Status: DC

## 2014-07-22 NOTE — Progress Notes (Signed)
Assessment and Plan:  1. Hypertension -Continue medication, monitor blood pressure at home. Continue DASH diet.  Reminder to go to the ER if any CP, SOB, nausea, dizziness, severe HA, changes vision/speech, left arm numbness and tingling and jaw pain.  2. Cholesterol -Continue diet and exercise. Check cholesterol.   3. Prediabetes  -Continue diet and exercise. Check A1C  4. Vitamin D Def - check level and continue medications.   5. Migraine ? Allergy related/TMJ- continue meds PRN, if continues ? Refer for botox/preventative  6. Hypothyroidism- check TSH level, continue medications the same, reminded to take on an empty stomach 30-81mins before food.   7. Allergies/asthma Continue zyrtec and start on Singulair. Continue albuterol PRN.   8. UTI Recheck 1 month  Continue diet and meds as discussed. Further disposition pending results of labs. Over 30 minutes of exam, counseling, chart review, and critical decision making was performed  HPI 54 y.o. female  presents for 3 month follow up on hypertension, cholesterol, prediabetes, and vitamin D deficiency.   Her blood pressure has been controlled at home, today their BP is BP: 128/78 mmHg  She does workout. She denies chest pain, shortness of breath, dizziness.  She had a Right MCA CVA attributed to a ? Rt Int Carotid Art Dissection suspect for Fibromuscular dysplasia and manifest at that time with a left spastic HP not totally resolved for which she still receives Botox injections by Dr Terrace Arabia preventing flexion contractures of the LUE. She does have migraines as well and has had headache for several days.   She is on cholesterol medication and denies myalgias. Her cholesterol is at goal. The cholesterol last visit was:   Lab Results  Component Value Date   CHOL 226* 04/08/2014   HDL 57 04/08/2014   LDLCALC 120* 04/08/2014   TRIG 243* 04/08/2014   CHOLHDL 4.0 04/08/2014    She has been working on diet and exercise for prediabetes,  and denies paresthesia of the feet, polydipsia, polyuria and visual disturbances. Last A1C in the office was:  Lab Results  Component Value Date   HGBA1C 5.7* 04/08/2014  Patient is on Vitamin D supplement.   Lab Results  Component Value Date   VD25OH 99 04/08/2014   Patient also complains of UTI symptoms. She has had symptoms for 1 week. complains of dysuria and urinary frequency. Patient denies chills. Patient does not have a history of recurrent UTI. Patient does not have a history of pyelonephritis. She came in for a lab only, had + UTI and started on cipro, has 2 pills left and is feeling better.  She is on thyroid medication. Her medication was not changed last visit.   Lab Results  Component Value Date   TSH 1.581 04/08/2014  .    Current Medications:  Current Outpatient Prescriptions on File Prior to Visit  Medication Sig Dispense Refill  . ALPRAZolam (XANAX) 1 MG tablet TAKE 1 TABLET BY MOUTH 3 TIMES A DAY AS NEEDED 90 tablet 0  . amphetamine-dextroamphetamine (ADDERALL) 10 MG tablet Take 1/2 to 1 tablet 1 or 2 x daily for ADD 90 tablet 0  . aspirin 325 MG EC tablet Take 325 mg by mouth daily.    Marland Kitchen buPROPion (WELLBUTRIN XL) 300 MG 24 hr tablet Take 1 tablet (300 mg total) by mouth daily. with food 90 tablet 3  . butalbital-acetaminophen-caffeine (FIORICET, ESGIC) 50-325-40 MG per tablet Take 1 tablet by mouth every 6 (six) hours as needed for headache. 30 tablet 3  .  cetirizine (ZYRTEC) 10 MG tablet Take 10 mg by mouth daily.    . ciprofloxacin (CIPRO) 250 MG tablet Take 1 tablet (250 mg total) by mouth 2 (two) times daily. 14 tablet 0  . clindamycin (CLEOCIN) 300 MG capsule 300 mg daily.    . CRESTOR 40 MG tablet TAKE 1 TABLET BY MOUTH EVERY DAY 30 tablet 3  . Diclofenac Potassium 50 MG PACK Take 50 mg by mouth as needed. 30 each 11  . ESTRING 2 MG vaginal ring Place 2 mg vaginally every 3 (three) months.     . fenofibrate micronized (LOFIBRA) 134 MG capsule TAKE 1 CAPSULE BY  MOUTH EVERY DAY 30 capsule 5  . Flaxseed, Linseed, (FLAX SEEDS PO) Take 1 capsule by mouth daily.    Marland Kitchen. HYDROcodone-acetaminophen (NORCO/VICODIN) 5-325 MG per tablet Take 1 tablet by mouth every 4 (four) hours as needed for moderate pain. 30 tablet 0  . levothyroxine (SYNTHROID, LEVOTHROID) 100 MCG tablet TAKE 1 TABLET BY MOUTH EVERY DAY 90 tablet 3  . magnesium oxide (MAG-OX) 400 MG tablet Take 400 mg by mouth 2 (two) times daily.     . Multiple Vitamin (MULTIVITAMIN WITH MINERALS) TABS tablet Take 1 tablet by mouth daily.    Marland Kitchen. omeprazole (PRILOSEC) 40 MG capsule TAKE 1 CAPSULE (40 MG TOTAL) BY MOUTH DAILY. FOR ACID INDIGESTION AND REFLUX 30 capsule 3  . prednisoLONE acetate (PRED FORTE) 1 % ophthalmic suspension   0  . PREDNISONE PO Take 20 mg by mouth. PRN for migraine headache    . prochlorperazine (COMPAZINE) 5 MG tablet TAKE 1 TABLET 3 TIMES A DAY AS NEEDED FOR NAUSEA 50 tablet 1  . PROVENTIL HFA 108 (90 BASE) MCG/ACT inhaler Inhale 2 puffs into the lungs every 6 (six) hours as needed for wheezing or shortness of breath.     . Topiramate ER 100 MG CS24 Take 1 capsule by mouth at bedtime.  11  . traMADol (ULTRAM) 50 MG tablet Take 1 tablet (50 mg total) by mouth every 6 (six) hours as needed. 50 tablet 0  . vitamin B-12 (CYANOCOBALAMIN) 1000 MCG tablet Take 1,000 mcg by mouth daily.    . Vitamin D, Ergocalciferol, (DRISDOL) 50000 UNITS CAPS capsule TAKE ONE CAPSULE BY MOUTH DAILY 30 capsule PRN   No current facility-administered medications on file prior to visit.   Medical History:  Past Medical History  Diagnosis Date  . Anxiety   . Spasm of muscle   . Occlusion and stenosis of carotid artery with cerebral infarction   . Headache(784.0)   . Unspecified cerebral artery occlusion with cerebral infarction   . Elevated cholesterol     on Crestor  . Depression   . Hypothyroidism     S/P thyroidectomy  . Asthma   . Chronic heartburn   . Hypertension   . Anemia   . Migraine   .  Vitamin D deficiency    Allergies:  Allergies  Allergen Reactions  . Penicillins Anaphylaxis and Hives  . Imitrex [Sumatriptan]   . Other Swelling    kiwi  . Sulfa Antibiotics Hives  . Zomig [Zolmitriptan]      Review of Systems:  Review of Systems  Constitutional: Positive for malaise/fatigue. Negative for fever, chills, weight loss and diaphoresis.  HENT: Positive for congestion. Negative for ear discharge, ear pain, hearing loss, nosebleeds, sore throat and tinnitus.   Eyes: Negative.   Respiratory: Positive for cough and wheezing. Negative for hemoptysis, sputum production, shortness of breath and stridor.  Cardiovascular: Negative.  Negative for chest pain and leg swelling.  Gastrointestinal: Positive for blood in stool.  Genitourinary: Negative.  Negative for dysuria, urgency and frequency.  Musculoskeletal: Positive for back pain, joint pain and neck pain. Negative for myalgias and falls.  Skin: Negative.   Neurological: Positive for headaches. Negative for dizziness, tingling, tremors, sensory change, speech change, focal weakness, seizures, loss of consciousness and weakness.  Psychiatric/Behavioral: Positive for depression and memory loss. Negative for suicidal ideas, hallucinations and substance abuse. The patient is nervous/anxious and has insomnia.     Family history- Review and unchanged Social history- Review and unchanged Physical Exam: BP 128/78 mmHg  Pulse 76  Temp(Src) 97.9 F (36.6 C)  Resp 16  Ht 5' 8.75" (1.746 m)  Wt 162 lb (73.483 kg)  BMI 24.10 kg/m2 Wt Readings from Last 3 Encounters:  07/22/14 162 lb (73.483 kg)  04/08/14 156 lb 12.8 oz (71.124 kg)  03/11/14 155 lb (70.308 kg)   General Appearance: Well nourished, in no apparent distress. Eyes: PERRLA, EOMs, conjunctiva no swelling or erythema, normal fundi and vessels. Sinuses: No frontal/maxillary tenderness ENT/Mouth: EACs patent / TMs  nl. Nares clear without erythema, swelling, mucoid  exudates. Oral hygiene is good. No erythema, swelling, or exudate. Tongue normal, non-obstructing. Tonsils not swollen or erythematous. Hearing normal. + TMJ tenderness Neck: Supple, thyroid normal. No bruits, nodes or JVD. Respiratory: Respiratory effort normal.  BS equal and clear bilateral without rales, rhonci, wheezing or stridor. Cardio: Heart sounds are normal with regular rate and rhythm and no murmurs, rubs or gallops. Peripheral pulses are normal and equal bilaterally without edema. No aortic or femoral bruits. Chest: symmetric with normal excursions and percussion. Breasts: Symmetric, without lumps, nipple discharge, retractions, or fibrocystic changes.  Abdomen: Flat, soft, with bowl sounds. Nontender, no guarding, rebound, hernias, masses, or organomegaly.  Lymphatics: Non tender without lymphadenopathy.  Musculoskeletal:  Mild spastic Lt HP and flexion type contracturing of the Lt wrist and hand. Otherwise full ROM of the Rt extremities, with normal joint stability, strength on the Rt  and  gait is normal  Skin: Warm and dry without rashes, lesions, cyanosis, clubbing or  ecchymosis.  Neuro: Cranial nerves intact, reflexes slightly increased on the Left and muscle tone likewise sl. Increased on the Left. No cerebellar symptoms. Sensation intact.  Pysch: Alert and oriented X 3, normal affect.    Quentin Mulling, PA-C 11:38 AM [facility]

## 2014-07-22 NOTE — Patient Instructions (Signed)
Allergic Rhinitis Allergic rhinitis is when the mucous membranes in the nose respond to allergens. Allergens are particles in the air that cause your body to have an allergic reaction. This causes you to release allergic antibodies. Through a chain of events, these eventually cause you to release histamine into the blood stream. Although meant to protect the body, it is this release of histamine that causes your discomfort, such as frequent sneezing, congestion, and an itchy, runny nose.  CAUSES  Seasonal allergic rhinitis (hay fever) is caused by pollen allergens that may come from grasses, trees, and weeds. Year-round allergic rhinitis (perennial allergic rhinitis) is caused by allergens such as house dust mites, pet dander, and mold spores.  SYMPTOMS   Nasal stuffiness (congestion).  Itchy, runny nose with sneezing and tearing of the eyes. DIAGNOSIS  Your health care provider can help you determine the allergen or allergens that trigger your symptoms. If you and your health care provider are unable to determine the allergen, skin or blood testing may be used. TREATMENT  Allergic rhinitis does not have a cure, but it can be controlled by:  Medicines and allergy shots (immunotherapy).  Avoiding the allergen. Hay fever may often be treated with antihistamines in pill or nasal spray forms. Antihistamines block the effects of histamine. There are over-the-counter medicines that may help with nasal congestion and swelling around the eyes. Check with your health care provider before taking or giving this medicine.  If avoiding the allergen or the medicine prescribed do not work, there are many new medicines your health care provider can prescribe. Stronger medicine may be used if initial measures are ineffective. Desensitizing injections can be used if medicine and avoidance does not work. Desensitization is when a patient is given ongoing shots until the body becomes less sensitive to the allergen.  Make sure you follow up with your health care provider if problems continue. HOME CARE INSTRUCTIONS It is not possible to completely avoid allergens, but you can reduce your symptoms by taking steps to limit your exposure to them. It helps to know exactly what you are allergic to so that you can avoid your specific triggers. SEEK MEDICAL CARE IF:   You have a fever.  You develop a cough that does not stop easily (persistent).  You have shortness of breath.  You start wheezing.  Symptoms interfere with normal daily activities. Document Released: 12/08/2000 Document Revised: 03/20/2013 Document Reviewed: 11/20/2012 [facility] Patient Information 2015 [place], [place]. This information is not intended to replace advice given to you by your health care provider. Make sure you discuss any questions you have with your health care provider.   What is the TMJ? The temporomandibular (tem-PUH-ro-man-DIB-yoo-ler) joint, or the TMJ, connects the upper and lower jawbones. This joint allows the jaw to open wide and move back and forth when you chew, talk, or yawn.There are also several muscles that help this joint move. There can be muscle tightness and pain in the muscle that can cause several symptoms.  What causes TMJ pain? There are many causes of TMJ pain. Repeated chewing (for example, chewing gum) and clenching your teeth can cause pain in the joint. Some TMJ pain has no obvious cause. What can I do to ease the pain? There are many things you can do to help your pain get better. When you have pain:  Eat soft foods and stay away from chewy foods (for example, taffy) Try to use both sides of your mouth to chew Don't chew gum Massage  Don't open your mouth wide (for example, during yawning or singing) Don't bite your cheeks or fingernails Lower your amount of stress and worry Applying a warm, damp washcloth to the joint may help. Over-the-counter pain medicines such as ibuprofen (one brand: Advil)  or acetaminophen (one brand: Tylenol) might also help. Do not use these medicines if you are allergic to them or if your doctor told you not to use them. How can I stop the pain from coming back? When your pain is better, you can do these exercises to make your muscles stronger and to keep the pain from coming back:  Resisted mouth opening: Place your thumb or two fingers under your chin and open your mouth slowly, pushing up lightly on your chin with your thumb. Hold for three to six seconds. Close your mouth slowly. Resisted mouth closing: Place your thumbs under your chin and your two index fingers on the ridge between your mouth and the bottom of your chin. Push down lightly on your chin as you close your mouth. Tongue up: Slowly open and close your mouth while keeping the tongue touching the roof of the mouth. Side-to-side jaw movement: Place an object about one fourth of an inch thick (for example, two tongue depressors) between your front teeth. Slowly move your jaw from side to side. Increase the thickness of the object as the exercise becomes easier Forward jaw movement: Place an object about one fourth of an inch thick between your front teeth and move the bottom jaw forward so that the bottom teeth are in front of the top teeth. Increase the thickness of the object as the exercise becomes easier. These exercises should not be painful. If it hurts to do these exercises, stop doing them and talk to your family doctor.

## 2014-07-23 LAB — VITAMIN D 25 HYDROXY (VIT D DEFICIENCY, FRACTURES): Vit D, 25-Hydroxy: 95 ng/mL (ref 30–100)

## 2014-07-23 LAB — INSULIN, FASTING: Insulin fasting, serum: 8.6 u[IU]/mL (ref 2.0–19.6)

## 2014-07-23 LAB — LIPID PANEL
Cholesterol: 120 mg/dL (ref 0–200)
HDL: 49 mg/dL (ref >=46)
LDL Cholesterol: 41 mg/dL (ref 0–99)
Total CHOL/HDL Ratio: 2.4 Ratio
Triglycerides: 150 mg/dL — ABNORMAL HIGH (ref <150)
VLDL: 30 mg/dL (ref 0–40)

## 2014-07-23 LAB — HEPATIC FUNCTION PANEL
ALT: 15 U/L (ref 0–35)
AST: 14 U/L (ref 0–37)
Albumin: 3.8 g/dL (ref 3.5–5.2)
Alkaline Phosphatase: 57 U/L (ref 39–117)
Bilirubin, Direct: 0.1 mg/dL (ref 0.0–0.3)
Indirect Bilirubin: 0.2 mg/dL (ref 0.2–1.2)
Total Bilirubin: 0.3 mg/dL (ref 0.2–1.2)
Total Protein: 6.3 g/dL (ref 6.0–8.3)

## 2014-07-23 LAB — BASIC METABOLIC PANEL WITH GFR
BUN: 20 mg/dL (ref 6–23)
CO2: 24 mEq/L (ref 19–32)
Calcium: 9.2 mg/dL (ref 8.4–10.5)
Chloride: 113 mEq/L — ABNORMAL HIGH (ref 96–112)
Creat: 1.3 mg/dL — ABNORMAL HIGH (ref 0.50–1.10)
GFR, Est African American: 53 mL/min — ABNORMAL LOW
GFR, Est Non African American: 46 mL/min — ABNORMAL LOW
Glucose, Bld: 80 mg/dL (ref 70–99)
Potassium: 4 mEq/L (ref 3.5–5.3)
Sodium: 146 mEq/L — ABNORMAL HIGH (ref 135–145)

## 2014-07-23 LAB — MAGNESIUM: Magnesium: 1.8 mg/dL (ref 1.5–2.5)

## 2014-07-23 LAB — TSH: TSH: 0.855 u[IU]/mL (ref 0.350–4.500)

## 2014-08-13 ENCOUNTER — Other Ambulatory Visit: Payer: Self-pay | Admitting: Internal Medicine

## 2014-08-14 ENCOUNTER — Ambulatory Visit: Payer: Self-pay

## 2014-08-15 ENCOUNTER — Telehealth: Payer: Self-pay | Admitting: *Deleted

## 2014-08-15 ENCOUNTER — Other Ambulatory Visit: Payer: Self-pay | Admitting: Neurology

## 2014-08-15 NOTE — Telephone Encounter (Signed)
RX for Fioricet faxed and confirmed to [facility] at [phone].

## 2014-08-19 ENCOUNTER — Ambulatory Visit (INDEPENDENT_AMBULATORY_CARE_PROVIDER_SITE_OTHER): Payer: BLUE CROSS/BLUE SHIELD

## 2014-08-19 DIAGNOSIS — N390 Urinary tract infection, site not specified: Secondary | ICD-10-CM

## 2014-08-19 NOTE — Progress Notes (Signed)
Patient ID: Ruth Gregory, female   DOB: 12/09/1960, 54 y.o.   MRN: [id number] Pt presents for 1 month f/u nurse visit to recheck urine.

## 2014-08-20 ENCOUNTER — Ambulatory Visit: Payer: Self-pay

## 2014-08-20 LAB — URINALYSIS, ROUTINE W REFLEX MICROSCOPIC
Bilirubin Urine: NEGATIVE
Glucose, UA: NEGATIVE mg/dL
Hgb urine dipstick: NEGATIVE
Ketones, ur: NEGATIVE mg/dL
Leukocytes, UA: NEGATIVE
Nitrite: NEGATIVE
Protein, ur: NEGATIVE mg/dL
Specific Gravity, Urine: <1.005 — ABNORMAL LOW (ref 1.005–1.030)
Urobilinogen, UA: 0.2 mg/dL (ref 0.0–1.0)
pH: 7 (ref 5.0–8.0)

## 2014-08-21 ENCOUNTER — Other Ambulatory Visit: Payer: Self-pay | Admitting: Internal Medicine

## 2014-08-21 DIAGNOSIS — G44039 Episodic paroxysmal hemicrania, not intractable: Secondary | ICD-10-CM

## 2014-08-21 LAB — URINE CULTURE
Colony Count: NO GROWTH
Organism ID, Bacteria: NO GROWTH

## 2014-08-21 MED ORDER — HYDROCODONE-ACETAMINOPHEN 5-325 MG PO TABS: 1.0000 | ORAL_TABLET | ORAL | Status: DC | PRN

## 2014-08-27 ENCOUNTER — Other Ambulatory Visit: Payer: Self-pay | Admitting: Internal Medicine

## 2014-09-02 ENCOUNTER — Telehealth: Payer: Self-pay | Admitting: Neurology

## 2014-09-02 NOTE — Telephone Encounter (Signed)
Daniel with [facility] called and stated after many failed attempts to contact the patient they were going to have to put her BOTOX injections on hold.

## 2014-09-02 NOTE — Telephone Encounter (Signed)
Spoke to Sprint Nextel CorporationKim - she did not return the pharmacy's call because she thought it was a Designer, multimediatelemarketer - she will call them today to request shipment.  She is not scheduled yet. I told her we would call her back once her insurance information has been checked.  Thanks.

## 2014-09-03 ENCOUNTER — Other Ambulatory Visit: Payer: Self-pay | Admitting: Physician Assistant

## 2014-09-05 NOTE — Progress Notes (Deleted)
Subjective:    Patient ID: Ruth Gregory is a 54 y.o. female.  HPI {Common ambulatory SmartLinks:[id number]}  Review of Systems  Objective:  Neurologic Exam  Physical Exam  Assessment:   ***  Plan:   ***  ***test***

## 2014-09-11 ENCOUNTER — Other Ambulatory Visit: Payer: Self-pay | Admitting: Internal Medicine

## 2014-09-17 ENCOUNTER — Encounter: Payer: Self-pay | Admitting: *Deleted

## 2014-09-17 DIAGNOSIS — G8114 Spastic hemiplegia affecting left nondominant side: Secondary | ICD-10-CM

## 2014-09-18 ENCOUNTER — Encounter: Payer: Self-pay | Admitting: Neurology

## 2014-09-18 ENCOUNTER — Ambulatory Visit (INDEPENDENT_AMBULATORY_CARE_PROVIDER_SITE_OTHER): Payer: BLUE CROSS/BLUE SHIELD | Admitting: Neurology

## 2014-09-18 VITALS — BP 122/76 | HR 76 | Ht 68.75 in | Wt 161.0 lb

## 2014-09-18 DIAGNOSIS — G8114 Spastic hemiplegia affecting left nondominant side: Secondary | ICD-10-CM | POA: Diagnosis not present

## 2014-09-18 NOTE — Progress Notes (Signed)
History of Present Illness: [name] is here for EMG guided BOTOX injection.  She has history of right internal carotid artery dissection following an episode of vomiting from migraine headache in mid June 2009. She was given IV TPA and found to have right MCA occlusion. She underwent emergent right carotid artery stent with distal endovascular recanalization of the middle cerebral artery.  She had small hemorrhagic transformation of the right basal ganglia with mild left hemiparesis, but has done well since then and has been independent.  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  She has been receiving BOTOX injection since 01/2009 q3 months, to her left upper extremity and also left lower extremity, responded well.  Repeat US carotid were normal in October 2013  UPDATE January 2016: She responded very well to last BOTOX injection in October 2015, she now complains of increased left foot discomfort, left ankle plantar flexion, also left anterior shin tenderness upon deep palpation  UPDATE June 22nd 2016:  She responded very well to previous injection in January 2016, she received 300 units of Botox total, injection was placed to her left arm, left lower extremity, this is longer stretch in between injection,she noticed wearing off of the benefit of Botox, increased left hand finger flexion, left elbow flexion pronation, left foot ankle plantarflexion, stiff gait  Physical Exam  General:  Pleasant Caucasian lady , in no distress.  Afebrile.   Head: nontraumatic Musculoskeletal: no deformity Skin: no rash, petechiae  Neurologic Exam  Mental Status: pleasant, awake, alert, cooperative to history, talking, and casual conversation. Cranial Nerves: CN II-XII pupils were equal round reactive to light.   Extraocular movements were full.  Visual fields were full on confrontational test.  slight shallow left nasolabial fold.  Hearing was intact to finger rubbing bilaterally.  Uvula tongue were midline.  Head turning and shoulder shrugging were normal and symmetric.  Tongue protrusion into the cheeks strength were normal.  Motor: mild spastic hemiparesis of left arm, tends to stay at left elbow flexion, pronation, mild left shoulder droop, decreased arm swing when ambulating. Mild left finger flexion, weak grip 4, left thumb tends to stay in distal flexed position.  Left ankle slight limited range of motion, tends to stay in left ankle plantar inversion, with left toe flexion. Sensory: Normal to light touch, pinprick, proprioception, and vibratory sensation. Coordination: There was no dysmetria noticed. Gait and Station: Narrow based and steady, mild left ankle plantar inversion, decreased left arm swing, slight left shoulder droop Reflexes: Deep tendon reflexes are 2+, hyper reflexia on left side  Plantars are downgoing.    Assessment and Plan:  54 years old female, Spastic left hemiparesis, due to right MCA stroke in June 2009, right internal carotid artery dissection, from violent vomiting during her migraine, s/p right IC stent.   Under electrical stimulations, 200units of BOTOX was used.   Left brachialis 25  Left flexor digitorum profundi 12.5  Left pronator 25  Left flexor digitorum superficialis 12.5 Left palmaris longus 12.5 Left  flexor carpi ulnaris 12.5   Left flexor digitorum longus 25x2=50 units  Left tibialis posterior 25 units  Left flexor digitorum brevis 25  units  She will return in 3 months for repeat injection  Levert Feinstein, M.D. Ph.D.  [facility] [address] [place], [place] [address] Phone: [phone] Fax:      [phone]

## 2014-09-18 NOTE — Progress Notes (Signed)
**  Botox - Lot [id number], Exp 03/2017**mck,rn

## 2014-10-02 ENCOUNTER — Other Ambulatory Visit: Payer: Self-pay | Admitting: Physician Assistant

## 2014-10-02 MED ORDER — AMPHETAMINE-DEXTROAMPHETAMINE 10 MG PO TABS: ORAL_TABLET | ORAL | Status: DC

## 2014-10-13 ENCOUNTER — Other Ambulatory Visit: Payer: Self-pay | Admitting: Physician Assistant

## 2014-10-24 ENCOUNTER — Ambulatory Visit (INDEPENDENT_AMBULATORY_CARE_PROVIDER_SITE_OTHER): Payer: BLUE CROSS/BLUE SHIELD | Admitting: Physician Assistant

## 2014-10-24 ENCOUNTER — Encounter: Payer: Self-pay | Admitting: Internal Medicine

## 2014-10-24 VITALS — BP 122/76 | HR 76 | Temp 97.3°F | Resp 16 | Ht 68.75 in | Wt 160.8 lb

## 2014-10-24 DIAGNOSIS — Z79899 Other long term (current) drug therapy: Secondary | ICD-10-CM

## 2014-10-24 DIAGNOSIS — E559 Vitamin D deficiency, unspecified: Secondary | ICD-10-CM

## 2014-10-24 DIAGNOSIS — R7309 Other abnormal glucose: Secondary | ICD-10-CM

## 2014-10-24 DIAGNOSIS — E782 Mixed hyperlipidemia: Secondary | ICD-10-CM

## 2014-10-24 DIAGNOSIS — E039 Hypothyroidism, unspecified: Secondary | ICD-10-CM

## 2014-10-24 DIAGNOSIS — R7303 Prediabetes: Secondary | ICD-10-CM

## 2014-10-24 DIAGNOSIS — R030 Elevated blood-pressure reading, without diagnosis of hypertension: Secondary | ICD-10-CM

## 2014-10-24 LAB — CBC WITH DIFFERENTIAL/PLATELET
Basophils Absolute: 0 10*3/uL (ref 0.0–0.1)
Basophils Relative: 1 % (ref 0–1)
Eosinophils Absolute: 0.1 10*3/uL (ref 0.0–0.7)
Eosinophils Relative: 2 % (ref 0–5)
HCT: 42.2 % (ref 36.0–46.0)
Hemoglobin: 14.4 g/dL (ref 12.0–15.0)
Lymphocytes Relative: 39 % (ref 12–46)
Lymphs Abs: 1.4 10*3/uL (ref 0.7–4.0)
MCH: 31.6 pg (ref 26.0–34.0)
MCHC: 34.1 g/dL (ref 30.0–36.0)
MCV: 92.7 fL (ref 78.0–100.0)
MPV: 10.8 fL (ref 8.6–12.4)
Monocytes Absolute: 0.4 10*3/uL (ref 0.1–1.0)
Monocytes Relative: 10 % (ref 3–12)
Neutro Abs: 1.8 10*3/uL (ref 1.7–7.7)
Neutrophils Relative %: 48 % (ref 43–77)
Platelets: 204 10*3/uL (ref 150–400)
RBC: 4.55 MIL/uL (ref 3.87–5.11)
RDW: 13.2 % (ref 11.5–15.5)
WBC: 3.7 10*3/uL — ABNORMAL LOW (ref 4.0–10.5)

## 2014-10-24 LAB — HEMOGLOBIN A1C
Hgb A1c MFr Bld: 5.8 % — ABNORMAL HIGH (ref <5.7)
Mean Plasma Glucose: 120 mg/dL — ABNORMAL HIGH (ref <117)

## 2014-10-24 MED ORDER — ALPRAZOLAM 1 MG PO TABS: ORAL_TABLET | ORAL | Status: DC

## 2014-10-24 NOTE — Progress Notes (Signed)
Assessment and Plan:  1. Hypertension -Continue medication, monitor blood pressure at home. Continue DASH diet.  Reminder to go to the ER if any CP, SOB, nausea, dizziness, severe HA, changes vision/speech, left arm numbness and tingling and jaw pain.  2. Cholesterol -Continue diet and exercise. Check cholesterol.   3. Prediabetes  -Continue diet and exercise. Check A1C  4. Vitamin D Def - check level and continue medications.   5. Migraine ? Allergy related/TMJ- continue meds PRN, discuss with Dr. Terrace Arabia  6. Hypothyroidism- check TSH level, continue medications the same, reminded to take on an empty stomach 30-4mins before food.   Continue diet and meds as discussed. Further disposition pending results of labs. Over 30 minutes of exam, counseling, chart review, and critical decision making was performed  HPI 54 y.o. female  presents for 3 month follow up on hypertension, cholesterol, prediabetes, and vitamin D deficiency.   Her blood pressure has been controlled at home, today their BP is BP: 122/76 mmHg  She does workout. She denies chest pain, shortness of breath, dizziness.  She had a Right MCA CVA attributed to a ? Rt Int Carotid Art Dissection suspect for Fibromuscular dysplasia and manifest at that time with a left spastic HP not totally resolved for which she still receives Botox injections by Dr Terrace Arabia preventing flexion contractures of the LUE, had recent injections 09/02/2014.Marland Kitchen She does have migraines as well.  She is on adderall which helps with concentration.   She is on cholesterol medication and denies myalgias. Her cholesterol is at goal. The cholesterol last visit was:   Lab Results  Component Value Date   CHOL 120 07/22/2014   HDL 49 07/22/2014   LDLCALC 41 07/22/2014   TRIG 150* 07/22/2014   CHOLHDL 2.4 07/22/2014    She has been working on diet and exercise for prediabetes, and denies paresthesia of the feet, polydipsia, polyuria and visual disturbances. Last A1C  in the office was:  Lab Results  Component Value Date   HGBA1C 5.8* 07/22/2014  Patient is on Vitamin D supplement.   Lab Results  Component Value Date   VD25OH 95 07/22/2014   She is on thyroid medication. Her medication was not changed last visit.   Lab Results  Component Value Date   TSH 0.855 07/22/2014  .    Current Medications:  Current Outpatient Prescriptions on File Prior to Visit  Medication Sig Dispense Refill  . ALPRAZolam (XANAX) 1 MG tablet TAKE 1 TABLET 3 TIMES A DAY AS NEEDED 90 tablet 1  . amphetamine-dextroamphetamine (ADDERALL) 10 MG tablet Take 1/2 to 1 tablet 1 or 2 x daily for ADD 60 tablet 0  . aspirin 325 MG EC tablet Take 325 mg by mouth daily.    . Botulinum Toxin Type A (BOTOX) 200 UNITS SOLR Inject as directed.    Marland Kitchen buPROPion (WELLBUTRIN XL) 300 MG 24 hr tablet Take 1 tablet (300 mg total) by mouth daily. with food 90 tablet 3  . butalbital-acetaminophen-caffeine (FIORICET, ESGIC) 50-325-40 MG per tablet TAKE 1 TABLET BY MOUTH EVERY 6 HOURS AS NEEDED FOR HEADACHE 30 tablet 1  . cetirizine (ZYRTEC) 10 MG tablet Take 10 mg by mouth daily.    . CRESTOR 40 MG tablet TAKE 1 TABLET BY MOUTH EVERY DAY (Patient taking differently: TAKE 1 TABLET BY MOUTH EVERY OTHER DAY) 30 tablet 2  . Diclofenac Potassium 50 MG PACK Take 50 mg by mouth as needed. 30 each 11  . ESTRING 2 MG vaginal ring  Place 2 mg vaginally every 3 (three) months.     . fenofibrate micronized (LOFIBRA) 134 MG capsule TAKE 1 CAPSULE BY MOUTH EVERY DAY 30 capsule 5  . Flaxseed, Linseed, (FLAX SEEDS PO) Take 1 capsule by mouth daily.    Marland Kitchen HYDROcodone-acetaminophen (NORCO/VICODIN) 5-325 MG per tablet Take 1 tablet by mouth every 4 (four) hours as needed for moderate pain. 30 tablet 0  . levothyroxine (SYNTHROID, LEVOTHROID) 100 MCG tablet TAKE 1 TABLET BY MOUTH EVERY DAY 90 tablet 3  . magnesium oxide (MAG-OX) 400 MG tablet Take 400 mg by mouth 2 (two) times daily.     . montelukast (SINGULAIR) 10 MG  tablet TAKE 1 TABLET BY MOUTH AT BEDTIME 30 tablet 99  . Multiple Vitamin (MULTIVITAMIN WITH MINERALS) TABS tablet Take 1 tablet by mouth daily.    Marland Kitchen omeprazole (PRILOSEC) 40 MG capsule TAKE 1 CAPSULE (40 MG TOTAL) BY MOUTH DAILY. FOR ACID INDIGESTION AND REFLUX 30 capsule 5  . prochlorperazine (COMPAZINE) 5 MG tablet TAKE 1 TABLET 3 TIMES A DAY AS NEEDED FOR NAUSEA 50 tablet 1  . PROVENTIL HFA 108 (90 BASE) MCG/ACT inhaler Inhale 2 puffs into the lungs every 6 (six) hours as needed for wheezing or shortness of breath.     . Topiramate ER 100 MG CS24 Take 1 capsule by mouth at bedtime.  11  . vitamin B-12 (CYANOCOBALAMIN) 1000 MCG tablet Take 1,000 mcg by mouth daily.     No current facility-administered medications on file prior to visit.   Medical History:  Past Medical History  Diagnosis Date  . Anxiety   . Spasm of muscle   . Occlusion and stenosis of carotid artery with cerebral infarction   . Headache(784.0)   . Unspecified cerebral artery occlusion with cerebral infarction   . Elevated cholesterol     on Crestor  . Depression   . Hypothyroidism     S/P thyroidectomy  . Asthma   . Chronic heartburn   . Hypertension   . Anemia   . Migraine   . Vitamin D deficiency    Allergies:  Allergies  Allergen Reactions  . Penicillins Anaphylaxis and Hives  . Imitrex [Sumatriptan]   . Other Swelling    kiwi  . Sulfa Antibiotics Hives  . Zomig [Zolmitriptan]      Review of Systems:  Review of Systems  Constitutional: Positive for malaise/fatigue. Negative for fever, chills, weight loss and diaphoresis.  HENT: Negative for congestion, ear discharge, ear pain, hearing loss, nosebleeds, sore throat and tinnitus.   Eyes: Negative.   Respiratory: Negative for cough, hemoptysis, sputum production, shortness of breath, wheezing and stridor.   Cardiovascular: Negative.  Negative for chest pain and leg swelling.  Gastrointestinal: Negative.  Negative for blood in stool.   Genitourinary: Negative.  Negative for dysuria, urgency and frequency.  Musculoskeletal: Positive for back pain, joint pain and neck pain. Negative for myalgias and falls.  Skin: Negative.   Neurological: Positive for headaches. Negative for dizziness, tingling, tremors, sensory change, speech change, focal weakness, seizures, loss of consciousness and weakness.  Psychiatric/Behavioral: Positive for depression and memory loss. Negative for suicidal ideas, hallucinations and substance abuse. The patient is nervous/anxious and has insomnia.     Family history- Review and unchanged Social history- Review and unchanged Physical Exam: BP 122/76 mmHg  Pulse 76  Temp(Src) 97.3 F (36.3 C)  Resp 16  Ht 5' 8.75" (1.746 m)  Wt 160 lb 12.8 oz (72.938 kg)  BMI 23.93 kg/m2 Wt  Readings from Last 3 Encounters:  10/24/14 160 lb 12.8 oz (72.938 kg)  09/18/14 161 lb (73.029 kg)  07/22/14 162 lb (73.483 kg)   General Appearance: Well nourished, in no apparent distress. Eyes: PERRLA, EOMs, conjunctiva no swelling or erythema, normal fundi and vessels. Sinuses: No frontal/maxillary tenderness ENT/Mouth: EACs patent / TMs  nl. Nares clear without erythema, swelling, mucoid exudates. Oral hygiene is good. No erythema, swelling, or exudate. Tongue normal, non-obstructing. Tonsils not swollen or erythematous. Hearing normal. + TMJ tenderness Neck: Supple, thyroid normal. No bruits, nodes or JVD. Respiratory: Respiratory effort normal.  BS equal and clear bilateral without rales, rhonci, wheezing or stridor. Cardio: Heart sounds are normal with regular rate and rhythm and no murmurs, rubs or gallops. Peripheral pulses are normal and equal bilaterally without edema. No aortic or femoral bruits. Chest: symmetric with normal excursions and percussion. Breasts: Symmetric, without lumps, nipple discharge, retractions, or fibrocystic changes.  Abdomen: Flat, soft, with bowl sounds. Nontender, no guarding, rebound,  hernias, masses, or organomegaly.  Lymphatics: Non tender without lymphadenopathy.  Musculoskeletal:  Mild spastic Lt HP and flexion type contracturing of the Lt wrist and hand. Otherwise full ROM of the Rt extremities, with normal joint stability, strength on the Rt  and  gait is normal  Skin: Warm and dry without rashes, lesions, cyanosis, clubbing or  ecchymosis.  Neuro: Cranial nerves intact, reflexes slightly increased on the Left and muscle tone likewise sl. Increased on the Left. No cerebellar symptoms. Sensation intact.  Pysch: Alert and oriented X 3, normal affect.    Quentin Mulling, PA-C 11:36 AM [facility]

## 2014-10-24 NOTE — Patient Instructions (Signed)
  Vitamin D goal is between 60-80  Please make sure that you are taking your Vitamin D as directed.   It is very important as a natural anti-inflammatory   helping hair, skin, and nails, as well as reducing stroke and heart attack risk.   It helps your bones and helps with mood.  It also decreases numerous cancer risks so please take it as directed.   Low Vit D is associated with a 200-300% higher risk for CANCER   and 200-300% higher risk for HEART   ATTACK  &  STROKE.    ......................................  It is also associated with higher death rate at younger ages,   autoimmune diseases like Rheumatoid arthritis, Lupus, Multiple Sclerosis.     Also many other serious conditions, like depression, Alzheimer's  Dementia, infertility, muscle aches, fatigue, fibromyalgia - just to name a few.  +++++++++++++++++++    

## 2014-10-25 LAB — BASIC METABOLIC PANEL WITH GFR
BUN: 17 mg/dL (ref 7–25)
CO2: 25 mEq/L (ref 20–31)
Calcium: 10.1 mg/dL (ref 8.6–10.4)
Chloride: 108 mEq/L (ref 98–110)
Creat: 1.15 mg/dL — ABNORMAL HIGH (ref 0.50–1.05)
GFR, Est African American: 62 mL/min (ref >=60)
GFR, Est Non African American: 54 mL/min — ABNORMAL LOW (ref >=60)
Glucose, Bld: 86 mg/dL (ref 65–99)
Potassium: 4.2 mEq/L (ref 3.5–5.3)
Sodium: 142 mEq/L (ref 135–146)

## 2014-10-25 LAB — LIPID PANEL
Cholesterol: 165 mg/dL (ref 125–200)
HDL: 52 mg/dL (ref >=46)
LDL Cholesterol: 79 mg/dL (ref <130)
Total CHOL/HDL Ratio: 3.2 Ratio (ref <=5.0)
Triglycerides: 168 mg/dL — ABNORMAL HIGH (ref <150)
VLDL: 34 mg/dL — ABNORMAL HIGH (ref <30)

## 2014-10-25 LAB — HEPATIC FUNCTION PANEL
ALT: 32 U/L — ABNORMAL HIGH (ref 6–29)
AST: 23 U/L (ref 10–35)
Albumin: 4.5 g/dL (ref 3.6–5.1)
Alkaline Phosphatase: 54 U/L (ref 33–130)
Bilirubin, Direct: 0.1 mg/dL (ref <=0.2)
Indirect Bilirubin: 0.4 mg/dL (ref 0.2–1.2)
Total Bilirubin: 0.5 mg/dL (ref 0.2–1.2)
Total Protein: 6.8 g/dL (ref 6.1–8.1)

## 2014-10-25 LAB — VITAMIN D 25 HYDROXY (VIT D DEFICIENCY, FRACTURES): Vit D, 25-Hydroxy: 122 ng/mL — ABNORMAL HIGH (ref 30–100)

## 2014-10-25 LAB — INSULIN, FASTING: Insulin fasting, serum: 14.7 u[IU]/mL (ref 2.0–19.6)

## 2014-10-25 LAB — TSH: TSH: 1.193 u[IU]/mL (ref 0.350–4.500)

## 2014-10-25 LAB — MAGNESIUM: Magnesium: 2 mg/dL (ref 1.5–2.5)

## 2014-10-31 ENCOUNTER — Other Ambulatory Visit: Payer: Self-pay | Admitting: Internal Medicine

## 2014-10-31 DIAGNOSIS — G44039 Episodic paroxysmal hemicrania, not intractable: Secondary | ICD-10-CM

## 2014-10-31 MED ORDER — HYDROCODONE-ACETAMINOPHEN 5-325 MG PO TABS: 1.0000 | ORAL_TABLET | ORAL | Status: DC | PRN

## 2014-11-05 ENCOUNTER — Encounter: Payer: Self-pay | Admitting: Internal Medicine

## 2014-11-05 ENCOUNTER — Ambulatory Visit (INDEPENDENT_AMBULATORY_CARE_PROVIDER_SITE_OTHER): Payer: BLUE CROSS/BLUE SHIELD | Admitting: Physician Assistant

## 2014-11-05 VITALS — BP 130/86 | HR 98 | Temp 98.0°F | Resp 18 | Ht 68.75 in

## 2014-11-05 DIAGNOSIS — J0100 Acute maxillary sinusitis, unspecified: Secondary | ICD-10-CM | POA: Diagnosis not present

## 2014-11-05 MED ORDER — TOPIRAMATE ER 150 MG PO SPRINKLE CAP24: EXTENDED_RELEASE_CAPSULE | ORAL | Status: DC

## 2014-11-05 MED ORDER — LEVOFLOXACIN 500 MG PO TABS: 500.0000 mg | ORAL_TABLET | Freq: Every day | ORAL | Status: DC

## 2014-11-05 NOTE — Patient Instructions (Signed)
Please take the prednisone to help decrease inflammation and therefore decrease symptoms. Take it it with food to avoid GI upset. It can cause increased energy but on the other hand it can make it hard to sleep at night so please take it AT NIGHT WITH DINNER, it takes 8-12 hours to start working so it will NOT affect your sleeping if you take it at night with your food!!  If you are diabetic it will increase your sugars so decrease carbs and monitor your sugars closely.    Sinusitis can be uncomfortable. People with sinusitis have congestion with yellow/green/gray discharge, sinus pain/pressure, pain around the eyes. Sinus infections almost ALWAYS stem from a viral infection and antibiotics don't work against a virus. Even when bacteria is responsible, the infections usually clear up on their own in a week or so.   PLEASE TRY TO DO OVER THE COUNTER TREATMENT AND PREDNISONE FOR 5-7 DAYS AND IF YOU ARE NOT GETTING BETTER OR GETTING WORSE THEN YOU CAN START ON AN ANTIBIOTIC GIVEN.  Can take the prednisone AT NIGHT WITH DINNER, it take 8-12 hours to start working so it will NOT affect your sleeping if you take it at night with your food!! Take two pills the first night and 1 or two pill the second night and then 1 pill the other nights.   Risk of antibiotic use: About 1 in 4 people who take antibiotics have side effects including stomach problems, dizziness, or rashes. Those problems clear up soon after stopping the drugs, but in rare cases antibiotics can cause severe allergic reaction. Over use of antibiotics also encourages the growth of bacteria that can't be controlled easily with drugs. That makes you more vunerable to antibiotic-resistant infections and undermines the benefits of antibiotics for others.   Waste of Money: Antibiotics often aren't very expensive, but any money spent on unnecessary drugs is money down the drain.   When are antibiotics needed? Only when symptoms last longer than a week.   Start to improve but then worsen again  -It can take up to 2 weeks to feel better.   -If you do not get better in 7-10 days (Have fever, facial pain, dental pain and swelling), then please call the office and it is now appropriate to start an antibiotic.   -Please take Tylenol or Ibuprofen for pain. -Acetaminiphen 325mg orally every 4-6 hours for pain.  Max: 10 per day -Ibuprofen 200mg orally every 6-8 hours for pain.  Take with food to avoid ulcers.   Max 10 per day  Please pick one of the over the counter allergy medications below and take it once daily for allergies.  Claritin or loratadine cheapest but likely the weakest  Zyrtec or certizine at night because it can make you sleepy The strongest is allegra or fexafinadine  Cheapest at [employer], [employer], [employer]  -While drinking fluids, pinch and hold nose close and swallow.  This will help open up your eustachian tubes to drain the fluid behind your ear drums. -Try steam showers to open your nasal passages.   Drink lots of water to stay hydrated and to thin mucous.  Flonase/Nasonex is to help the inflammation.  Take 2 sprays in each nostril at bedtime.  Make sure you spray towards the outside of each nostril towards the outer corner of your eye, hold nose close and tilt head back.  This will help the medication get into your sinuses.  If you do not like this medication, then use saline   nasal sprays same directions as above for Flonase. Stop the medication right away if you get blurring of your vision or nose bleeds.  Sinusitis Sinusitis is redness, soreness, and inflammation of the paranasal sinuses. Paranasal sinuses are air pockets within the bones of your face (beneath the eyes, the middle of the forehead, or above the eyes). In healthy paranasal sinuses, mucus is able to drain out, and air is able to circulate through them by way of your nose. However, when your paranasal sinuses are inflamed, mucus and air can become trapped. This can  allow bacteria and other germs to grow and cause infection. Sinusitis can develop quickly and last only a short time (acute) or continue over a long period (chronic). Sinusitis that lasts for more than 12 weeks is considered chronic.  CAUSES  Causes of sinusitis include: Allergies. Structural abnormalities, such as displacement of the cartilage that separates your nostrils (deviated septum), which can decrease the air flow through your nose and sinuses and affect sinus drainage. Functional abnormalities, such as when the small hairs (cilia) that line your sinuses and help remove mucus do not work properly or are not present. SIGNS AND SYMPTOMS  Symptoms of acute and chronic sinusitis are the same. The primary symptoms are pain and pressure around the affected sinuses. Other symptoms include: Upper toothache. Earache. Headache. Bad breath. Decreased sense of smell and taste. A cough, which worsens when you are lying flat. Fatigue. Fever. Thick drainage from your nose, which often is green and may contain pus (purulent). Swelling and warmth over the affected sinuses. DIAGNOSIS  Your health care provider will perform a physical exam. During the exam, your health care provider may: Look in your nose for signs of abnormal growths in your nostrils (nasal polyps).  Tap over the affected sinus to check for signs of infection. View the inside of your sinuses (endoscopy) using an imaging device that has a light attached (endoscope). If your health care provider suspects that you have chronic sinusitis, one or more of the following tests may be recommended: Allergy tests. Nasal culture. A sample of mucus is taken from your nose, sent to a lab, and screened for bacteria. Nasal cytology. A sample of mucus is taken from your nose and examined by your health care provider to determine if your sinusitis is related to an allergy. TREATMENT  Most cases of acute sinusitis are related to a viral infection  and will resolve on their own within 10 days. Sometimes medicines are prescribed to help relieve symptoms (pain medicine, decongestants, nasal steroid sprays, or saline sprays).  However, for sinusitis related to a bacterial infection, your health care provider will prescribe antibiotic medicines. These are medicines that will help kill the bacteria causing the infection.  Rarely, sinusitis is caused by a fungal infection. In theses cases, your health care provider will prescribe antifungal medicine. For some cases of chronic sinusitis, surgery is needed. Generally, these are cases in which sinusitis recurs more than 3 times per year, despite other treatments. HOME CARE INSTRUCTIONS  Drink plenty of water. Water helps thin the mucus so your sinuses can drain more easily. Use a humidifier. Inhale steam 3 to 4 times a day (for example, sit in the bathroom with the shower running). Apply a warm, moist washcloth to your face 3 to 4 times a day, or as directed by your health care provider. Use saline nasal sprays to help moisten and clean your sinuses. Take medicines only as directed by your health care provider.   If you were prescribed either an antibiotic or antifungal medicine, finish it all even if you start to feel better. SEEK IMMEDIATE MEDICAL CARE IF: You have increasing pain or severe headaches. You have nausea, vomiting, or drowsiness. You have swelling around your face. You have vision problems. You have a stiff neck. You have difficulty breathing. MAKE SURE YOU:  Understand these instructions. Will watch your condition. Will get help right away if you are not doing well or get worse. Document Released: 03/15/2005 Document Revised: 07/30/2013 Document Reviewed: 03/30/2011 Exit[employer] Patient Information 2015 [employer]. This information is not intended to replace advice given to you by your health care provider. Make sure you discuss any questions you have with your health care  provider.   

## 2014-11-05 NOTE — Progress Notes (Signed)
HPI  Patient presents with sinusitis symptoms for 5 days, states she has had sinus surgery on her right nostril for polyp and has ran out of her allergy med for a week and started to have sinus symptoms. Patient has congestion, post nasal drip, sinus pressure, achiness and low grade fevers Denies ear pressure and productive cough.  Patient has tried antihistamine-decongestant of choice without relief. Smoking status: never smoked.  Past Medical History  Diagnosis Date  . Anxiety   . Spasm of muscle   . Occlusion and stenosis of carotid artery with cerebral infarction   . Headache(784.0)   . Unspecified cerebral artery occlusion with cerebral infarction   . Elevated cholesterol     on Crestor  . Depression   . Hypothyroidism     S/P thyroidectomy  . Asthma   . Chronic heartburn   . Hypertension   . Anemia   . Migraine   . Vitamin D deficiency     Current Outpatient Prescriptions on File Prior to Visit  Medication Sig Dispense Refill  . ALPRAZolam (XANAX) 1 MG tablet TAKE 1 TABLET 3 TIMES A DAY AS NEEDED 90 tablet 1  . amphetamine-dextroamphetamine (ADDERALL) 10 MG tablet Take 1/2 to 1 tablet 1 or 2 x daily for ADD 60 tablet 0  . aspirin 325 MG EC tablet Take 325 mg by mouth daily.    . Botulinum Toxin Type A (BOTOX) 200 UNITS SOLR Inject as directed.    Marland Kitchen buPROPion (WELLBUTRIN XL) 300 MG 24 hr tablet Take 1 tablet (300 mg total) by mouth daily. with food 90 tablet 3  . butalbital-acetaminophen-caffeine (FIORICET, ESGIC) 50-325-40 MG per tablet TAKE 1 TABLET BY MOUTH EVERY 6 HOURS AS NEEDED FOR HEADACHE 30 tablet 1  . cetirizine (ZYRTEC) 10 MG tablet Take 10 mg by mouth daily.    . CRESTOR 40 MG tablet TAKE 1 TABLET BY MOUTH EVERY DAY (Patient taking differently: TAKE 1 TABLET BY MOUTH EVERY OTHER DAY) 30 tablet 2  . Diclofenac Potassium 50 MG PACK Take 50 mg by mouth as needed. 30 each 11  . ESTRING 2 MG vaginal ring Place 2 mg vaginally every 3 (three) months.     . fenofibrate  micronized (LOFIBRA) 134 MG capsule TAKE 1 CAPSULE BY MOUTH EVERY DAY 30 capsule 5  . Flaxseed, Linseed, (FLAX SEEDS PO) Take 1 capsule by mouth daily.    Marland Kitchen HYDROcodone-acetaminophen (NORCO/VICODIN) 5-325 MG per tablet Take 1 tablet by mouth every 4 (four) hours as needed for moderate pain. 30 tablet 0  . levothyroxine (SYNTHROID, LEVOTHROID) 100 MCG tablet TAKE 1 TABLET BY MOUTH EVERY DAY 90 tablet 3  . magnesium oxide (MAG-OX) 400 MG tablet Take 400 mg by mouth 2 (two) times daily.     . montelukast (SINGULAIR) 10 MG tablet TAKE 1 TABLET BY MOUTH AT BEDTIME 30 tablet 99  . Multiple Vitamin (MULTIVITAMIN WITH MINERALS) TABS tablet Take 1 tablet by mouth daily.    Marland Kitchen omeprazole (PRILOSEC) 40 MG capsule TAKE 1 CAPSULE (40 MG TOTAL) BY MOUTH DAILY. FOR ACID INDIGESTION AND REFLUX 30 capsule 5  . prochlorperazine (COMPAZINE) 5 MG tablet TAKE 1 TABLET 3 TIMES A DAY AS NEEDED FOR NAUSEA 50 tablet 1  . PROVENTIL HFA 108 (90 BASE) MCG/ACT inhaler Inhale 2 puffs into the lungs every 6 (six) hours as needed for wheezing or shortness of breath.     . Topiramate ER 100 MG CS24 Take 1 capsule by mouth at bedtime.  11  .  vitamin B-12 (CYANOCOBALAMIN) 1000 MCG tablet Take 1,000 mcg by mouth daily.     No current facility-administered medications on file prior to visit.   Allergies  Allergen Reactions  . Penicillins Anaphylaxis and Hives  . Imitrex [Sumatriptan]   . Other Swelling    kiwi  . Sulfa Antibiotics Hives  . Zomig [Zolmitriptan]     ROS- Review of systems negative expect for the pertinent information above.  Physical-  Filed Vitals:   11/05/14 1645  BP: 130/86  Pulse: 98  Temp: 98 F (36.7 C)  Resp: 18    General Appearance: Patient in no distress.  Eyes: PERRLA, EOMS, conjunctiva pink without drainage or nodules.  Sinuses: + tender left  maxillary and nontender frontal sinus tenderness.  ENT/Mouth: External auditory canals clear, no swelling, erythema, drainage. TMs normal  light reflex without erythema, bulging. Normal nasal mucosa and turbinates. Good denition. Post pharynx without erythematous, swelling or exudate. Tonsils without swelling, erythema or exudate.  Neck: Supple.  Respiratory: CTAB.  Carviovascular: RRR, no murmurs, rubs or gallops.  Assessment and plan  1. Acute maxillary sinusitis, recurrence not specified - levofloxacin (LEVAQUIN) 500 MG tablet; Take 1 tablet (500 mg total) by mouth daily.  Dispense: 10 tablet; Refill: 0

## 2014-11-13 ENCOUNTER — Other Ambulatory Visit: Payer: Self-pay | Admitting: Internal Medicine

## 2014-12-06 ENCOUNTER — Other Ambulatory Visit: Payer: Self-pay | Admitting: Neurology

## 2014-12-10 ENCOUNTER — Other Ambulatory Visit: Payer: Self-pay

## 2014-12-10 MED ORDER — TOPIRAMATE ER 150 MG PO SPRINKLE CAP24: EXTENDED_RELEASE_CAPSULE | ORAL | Status: DC

## 2014-12-16 ENCOUNTER — Telehealth: Payer: Self-pay | Admitting: Neurology

## 2014-12-16 NOTE — Telephone Encounter (Signed)
Chart reviewed, she received 200 units of electronic stimulation guided Botox injection to her spastic right upper and lower extremity in June 22nd 2016,  She will need 300 units of Botox for next injection,. New Rx was written

## 2014-12-16 NOTE — Telephone Encounter (Signed)
Called patient to let her know the increased dose of Botox will be delivered in time for her appt.

## 2014-12-16 NOTE — Telephone Encounter (Signed)
She received 300 units on 04/10/14 then 200 units on 09/18/14.  What dose would you like for her next visit?

## 2014-12-16 NOTE — Telephone Encounter (Signed)
Pt called and is wondering if the rx that was wrote for botox was the right one. Pt  States that Dr. Terrace Arabia and her had a conversation stating the pt needed more than what was given the last time. If so she needs a new rx so she can order the medication. Please call and advise [phone]

## 2014-12-16 NOTE — Telephone Encounter (Signed)
Called [facility] gave verbal Rx to increase units to 300. Botox will be shipped on today for delivery tomorrow 12/17/2014.

## 2014-12-16 NOTE — Telephone Encounter (Signed)
Patient will need 300 units at her next injection.  She has an appt pending on 12/25/14.  Says she would rather not reschedule this appt. She uses [facility] for [facility] specialty [facility].  Dr. Terrace Arabia provided new rx for Cassandra.

## 2014-12-17 ENCOUNTER — Other Ambulatory Visit: Payer: Self-pay | Admitting: Physician Assistant

## 2014-12-17 MED ORDER — AMPHETAMINE-DEXTROAMPHETAMINE 10 MG PO TABS: ORAL_TABLET | ORAL | Status: DC

## 2014-12-18 ENCOUNTER — Telehealth: Payer: Self-pay

## 2014-12-18 NOTE — Telephone Encounter (Signed)
I left a message for the patient to return my call.

## 2014-12-19 NOTE — Telephone Encounter (Signed)
Melanie/[facility] [phone] called to set up delivery of Botox. They spoke with patient who gave consent to ship. Please call back.

## 2014-12-19 NOTE — Telephone Encounter (Signed)
Returned call. Confirmed shipment. Botox TBD 12/24/2014.

## 2014-12-19 NOTE — Telephone Encounter (Signed)
Called patient left a vmail explaining she would need to contact specialty pharmacy so they can ship Botox. 3 attempts have been made by specialty pharm to reach patient.

## 2014-12-20 ENCOUNTER — Telehealth: Payer: Self-pay | Admitting: Neurology

## 2014-12-20 NOTE — Telephone Encounter (Signed)
I spoke to the patient in regards to the [employer] research trial. I performed the Final Assessment telephone call, and I thanked the patient for her participation in the study.

## 2014-12-25 ENCOUNTER — Encounter: Payer: Self-pay | Admitting: Neurology

## 2014-12-25 ENCOUNTER — Ambulatory Visit (INDEPENDENT_AMBULATORY_CARE_PROVIDER_SITE_OTHER): Payer: BLUE CROSS/BLUE SHIELD | Admitting: Neurology

## 2014-12-25 VITALS — BP 142/89 | HR 95 | Ht 68.75 in | Wt 160.0 lb

## 2014-12-25 DIAGNOSIS — G43101 Migraine with aura, not intractable, with status migrainosus: Secondary | ICD-10-CM | POA: Diagnosis not present

## 2014-12-25 DIAGNOSIS — G8114 Spastic hemiplegia affecting left nondominant side: Secondary | ICD-10-CM | POA: Diagnosis not present

## 2014-12-25 NOTE — Progress Notes (Signed)
**  Botox 100units x 3, Lot [id number], exp 07/2017, NDC 4540-9811-91, [facility]**mck,rn

## 2014-12-25 NOTE — Progress Notes (Signed)
History of Present Illness: Ruth Gregory is here for EMG guided BOTOX injection.  She has history of right internal carotid artery dissection following an episode of vomiting from migraine headache in mid June 2009. She was given IV TPA and found to have right MCA occlusion. She underwent emergent right carotid artery stent with distal endovascular recanalization of the middle cerebral artery.  She had small hemorrhagic transformation of the right basal ganglia with mild left hemiparesis, but has done well since then and has been independent.  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  She has been receiving BOTOX injection since 01/2009 q3 months, to her left upper extremity and also left lower extremity, responded well.  Repeat US carotid were normal in October 2013  UPDATE January 2016: She responded very well to last BOTOX injection in October 2015, she now complains of increased left foot discomfort, left ankle plantar flexion, also left anterior shin tenderness upon deep palpation  UPDATE June 22nd 2016: She responded very well to previous injection in January 2016, she received 300 units of Botox total, injection was placed to her left arm, left lower extremity, this is longer stretch in between injection,she noticed wearing off of the benefit of Botox, increased left hand finger flexion, left elbow flexion pronation, left foot ankle plantarflexion, stiff gait  UPDATE Sep 28th 2016: She  complains of increased migraine over the past few weeks, each headache last about 2-3 days, she is on higher dose of Topamax ER 150 mg every night, also taking magnesium oxide 400 mg twice a day, her headache usually increased with weather changes, bright light, stress, exertion.  She usually takes cambia, which works for her headache most of the time, but it takes 3-4 hours to take effect, sometimes she has incomplete relief of her headaches, for headache lasting longer than 2 days, she sometimes resolved to oxycodone, and prednisone.   Chief Complaint  Patient presents with  . Spastic Hemiparesis    Botox 300 units - [facility]  She woke up with a migraine today and had to take a Cambia prior to coming in today.    Physical Exam  General: Pleasant Caucasian lady , in no distress.  Afebrile.   Head: nontraumatic Musculoskeletal: no deformity Skin: no rash, petechiae  Neurologic Exam  Mental Status: pleasant, awake, alert, cooperative to history, talking, and casual conversation. Cranial Nerves: CN II-XII pupils were equal round reactive to light.   Extraocular movements were full.  Visual fields were full on confrontational test.  slight shallow left nasolabial fold.  Hearing was intact to finger rubbing bilaterally.  Uvula tongue were midline.  Head turning and shoulder shrugging were normal and symmetric.  Tongue protrusion into the cheeks strength were normal.  Motor: mild spastic hemiparesis of left arm, tends to stay at left elbow flexion, pronation, mild left shoulder droop, decreased arm swing when ambulating. Mild left finger flexion, weak grip 4, left thumb tends to stay in distal flexed position.  Left ankle slight limited range  of motion, tends to stay in left ankle plantar inversion, with left toe flexion. Sensory: Normal to light touch, pinprick, proprioception, and vibratory sensation. Coordination: There was no dysmetria noticed. Gait and Station: Narrow based and  steady, mild left ankle plantar inversion, decreased left arm swing, slight left shoulder droop Reflexes: Deep tendon reflexes are 2+, hyper reflexia on left side  Plantars are downgoing.    Assessment and Plan:  54 years old female, Spastic left hemiparesis, due to right MCA stroke in June 2009, right internal carotid artery dissection, from violent vomiting during her migraine, s/p right IC stent.   Under electrical stimulations, 300units of BOTOX was used.   Left brachialis 25 x2=50 Left flexor digitorum profundi 12.5  Left pronator 25  Left flexor digitorum superficialis 12.5 Left palmaris longus 12.5 Left flexor carpi ulnaris 12.5 Left pectoralis major 25 x2= 50 Left teres major 25 units    Left flexor digitorum longus 25x2=50 units  Left tibialis posterior 25 units x2=50    She will return in 3 months for repeat injection  Worsening chronic migraine headaches   Increased the dose to Topamax ER 150 mg every night   Continue magnesium oxide 400 mg twice a day, riboflavin 100 mg twice a day   Cambia and Compazine as needed for severe headaches  Levert Feinstein, M.D. Ph.D.  [facility] [address] [place], [place] [address] Phone: [phone] Fax:      [phone]

## 2014-12-25 NOTE — Patient Instructions (Signed)
Riboflavin 100 mg twice a day

## 2014-12-29 ENCOUNTER — Other Ambulatory Visit: Payer: Self-pay | Admitting: Neurology

## 2014-12-30 ENCOUNTER — Other Ambulatory Visit: Payer: Self-pay | Admitting: *Deleted

## 2014-12-30 MED ORDER — VITAMIN D (ERGOCALCIFEROL) 1.25 MG (50000 UNIT) PO CAPS: ORAL_CAPSULE | ORAL | Status: DC

## 2014-12-31 ENCOUNTER — Telehealth: Payer: Self-pay | Admitting: Neurology

## 2014-12-31 NOTE — Telephone Encounter (Signed)
Pt called stating CAMBIA 50 MG PACK she took last one this morning and it is due to be filled 01/01/15. She is confused and has received text from [facility] but did not check it. She will check with them and if she needs [place] she will call back.

## 2015-01-01 NOTE — Telephone Encounter (Signed)
Patient called to advise [facility] won't fill Cambia Rx due to needing PA. Patient states, she needs her medication, [facility] told her it usually takes 3-5 days to get this done. Patient states there is a storm coming and she needs her medication.

## 2015-01-01 NOTE — Telephone Encounter (Addendum)
Ins has been contacted and provided with clinical info.  Request is currently under review.  Ref # [id number]  Ins has approved coverage for Cambia effective until 01/01/2016.

## 2015-01-16 ENCOUNTER — Other Ambulatory Visit: Payer: Self-pay | Admitting: Neurology

## 2015-01-18 ENCOUNTER — Other Ambulatory Visit: Payer: Self-pay | Admitting: Internal Medicine

## 2015-01-20 ENCOUNTER — Telehealth: Payer: Self-pay | Admitting: *Deleted

## 2015-01-20 ENCOUNTER — Other Ambulatory Visit: Payer: Self-pay

## 2015-01-20 MED ORDER — BUTALBITAL-APAP-CAFFEINE 50-325-40 MG PO TABS: ORAL_TABLET | ORAL | Status: DC

## 2015-01-20 NOTE — Telephone Encounter (Signed)
Rx for generic Fioricet faxed and confirmed to [facility] at [phone].

## 2015-01-21 ENCOUNTER — Telehealth: Payer: Self-pay | Admitting: Neurology

## 2015-01-21 NOTE — Telephone Encounter (Signed)
Cambia was sent in on 12/29/14 with 11 additional refills.  Patient aware.

## 2015-01-21 NOTE — Telephone Encounter (Signed)
Pt called sts she has one packet of [phone] left. She picked medication on 01/02/15. She is requesting to get more. She sts with the storm and allergy season she has take more. Please call and advise at [phone].

## 2015-01-27 ENCOUNTER — Encounter: Payer: Self-pay | Admitting: Physician Assistant

## 2015-01-27 ENCOUNTER — Other Ambulatory Visit: Payer: Self-pay | Admitting: Internal Medicine

## 2015-01-27 MED ORDER — PREDNISONE 20 MG PO TABS: ORAL_TABLET | ORAL | Status: DC

## 2015-01-29 ENCOUNTER — Ambulatory Visit (INDEPENDENT_AMBULATORY_CARE_PROVIDER_SITE_OTHER): Payer: BLUE CROSS/BLUE SHIELD | Admitting: Neurology

## 2015-01-29 ENCOUNTER — Encounter: Payer: Self-pay | Admitting: Neurology

## 2015-01-29 VITALS — BP 136/87 | HR 82 | Ht 68.75 in | Wt 157.0 lb

## 2015-01-29 DIAGNOSIS — G43101 Migraine with aura, not intractable, with status migrainosus: Secondary | ICD-10-CM | POA: Diagnosis not present

## 2015-01-29 DIAGNOSIS — G8114 Spastic hemiplegia affecting left nondominant side: Secondary | ICD-10-CM | POA: Diagnosis not present

## 2015-01-29 DIAGNOSIS — IMO0002 Reserved for concepts with insufficient information to code with codable children: Secondary | ICD-10-CM

## 2015-01-29 DIAGNOSIS — G43709 Chronic migraine without aura, not intractable, without status migrainosus: Secondary | ICD-10-CM | POA: Diagnosis not present

## 2015-01-29 MED ORDER — HYDROCODONE-ACETAMINOPHEN 5-325 MG PO TABS: 1.0000 | ORAL_TABLET | ORAL | Status: DC | PRN

## 2015-01-29 NOTE — Progress Notes (Signed)
PATIENT: Ruth Gregory DOB: 17-Nov-1960  Chief Complaint  Patient presents with  . Migraine    She is here with her husband, Ruth Gregory, to discuss her increased frequency of migraines.  She is only allowed 9 packets of Cambia per month and the rx does not last 30 days.  She uses her Fioricet which is helpful.  She no longer has hydrocodone and her PCP will not provide her further refills.  Over the weekend, she used Prednisone and an oxycodone tablet leftover from another rx.     HISTORICAL  Ruth Gregory is a 54 year old right-handed female  History of right internal carotid section with spastic left hemiparesis, Botox injection every 3 months  She has history of right internal carotid artery dissection following an episode of vomiting from migraine headache in mid June 2009. She was given IV TPA and found to have right MCA occlusion. She underwent emergent right carotid artery stent with distal endovascular recanalization of the middle cerebral artery.  She had small hemorrhagic transformation of the right basal ganglia with mild left hemiparesis, but has done well since then and has been independent.  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  She has been receiving BOTOX injection since 01/2009 every 3 months, to her left upper extremity and also left lower extremity, responded well.  Repeat US carotid were normal in October 2013  Chronic migraine headaches: She had long-standing history of  chronic migraine, average 2-4 headaches each months, she has increased frequency headaches around fall, and spring, she is now taking Topamax ER 150 mg every night as preventive medication, magnesium oxide 400 mg, riboflavin 100 mg twice a day,  She is not candidate for triptan because previous history of stroke, Cambia works well for her, she her insurance only allow her to have 9 tablets each months. Occasionally she take Fioricet, hydrocodone as needed, even prednisone 20 mg as needed as rescue therapy  Over the past few weeks, she been having to 3 headaches each week, used out her cambia, she also complains of neck muscle tightness, receiving chiropractor sometimes   Last Botox injection to spastic left upper extremity works well, but she noticed left hand muscle weakness.  Review of system: Pertinent as above   ALLERGIES: Allergies  Allergen Reactions  . Penicillins Anaphylaxis and Hives  . Imitrex [Sumatriptan]   . Other Swelling    kiwi  . Sulfa Antibiotics Hives  . Zomig [Zolmitriptan]     HOME MEDICATIONS: Current Outpatient Prescriptions  Medication Sig Dispense Refill  . ALPRAZolam (XANAX) 1 MG tablet TAKE 1 TABLET 3 TIMES A DAY AS NEEDED 90 tablet 1  . amphetamine-dextroamphetamine (ADDERALL) 10 MG tablet Take 1/2 to 1 tablet 1 or 2 x daily for ADD 60 tablet 0  . aspirin 325 MG EC tablet Take 325 mg by mouth daily.    . Botulinum Toxin Type A (BOTOX) 200 UNITS SOLR Inject as directed. Change to BOTOX A 100  units    . buPROPion (WELLBUTRIN XL)  300 MG 24 hr tablet Take 1 tablet (300 mg total) by mouth daily. with food 90 tablet 3  . butalbital-acetaminophen-caffeine (FIORICET, ESGIC) 50-325-40 MG tablet TAKE 1 TABLET BY MOUTH EVERY 6 HOURS AS NEEDED FOR HEADACHE 30 tablet 1  . CAMBIA 50 MG PACK TAKE 1 PACKET BY MOUTH AS NEEDED. 30 each 11  . cetirizine (ZYRTEC) 10 MG tablet Take 10 mg by mouth daily.    . CRESTOR 40 MG tablet TAKE 1 TABLET BY MOUTH EVERY DAY (Patient taking  differently: TAKE 1 TABLET BY MOUTH EVERY OTHER DAY) 30 tablet 2  . ESTRING 2 MG vaginal ring Place 2 mg vaginally every 3 (three) months.     . fenofibrate micronized (LOFIBRA) 134 MG capsule TAKE 1 CAPSULE BY MOUTH EVERY DAY 30 capsule 5  . HYDROcodone-acetaminophen (NORCO/VICODIN) 5-325 MG tablet Take 1 tablet by mouth every 4 (four) hours as needed for moderate pain. 30 tablet 0  . magnesium oxide (MAG-OX) 400 MG tablet Take 400 mg by mouth 2 (two) times daily.     . montelukast (SINGULAIR) 10 MG tablet TAKE 1 TABLET BY MOUTH AT BEDTIME 30 tablet 99  . Multiple Vitamin (MULTIVITAMIN WITH MINERALS) TABS tablet Take 1 tablet by mouth daily.    Marland Kitchen omeprazole (PRILOSEC) 40 MG capsule TAKE 1 CAPSULE (40 MG TOTAL) BY MOUTH DAILY. FOR ACID INDIGESTION AND REFLUX 30 capsule 5  . predniSONE (DELTASONE) 20 MG tablet Take 1 tablet per day as needed for headaches. 90 tablet 1  . prochlorperazine (COMPAZINE) 5 MG tablet TAKE 1 TABLET 3 TIMES A DAY AS NEEDED FOR NAUSEA 50 tablet 1  . PROVENTIL HFA 108 (90 BASE) MCG/ACT inhaler Inhale 2 puffs into the lungs every 6 (six) hours as needed for wheezing or shortness of breath.     . Riboflavin 100 MG TABS Take 100 mg by mouth. bid    . Topiramate ER 150 MG CS24 1 pill daily 30 each 3  . vitamin B-12 (CYANOCOBALAMIN) 1000 MCG tablet Take 1,000 mcg by mouth daily.    . Vitamin D, Ergocalciferol, (DRISDOL) 50000 UNITS CAPS capsule Take 1 capsule daily 30 capsule 1   No current facility-administered medications for this visit.    PAST MEDICAL HISTORY: Past Medical History  Diagnosis Date  . Anxiety   . Spasm of muscle   . Occlusion and stenosis of carotid artery with cerebral infarction   . Headache(784.0)   . Unspecified cerebral artery occlusion with cerebral infarction   . Elevated cholesterol     on Crestor  . Depression   . Hypothyroidism     S/P thyroidectomy  . Asthma   . Chronic heartburn   . Hypertension   . Anemia   . Migraine   . Vitamin  D deficiency     PAST SURGICAL HISTORY: Past Surgical History  Procedure Laterality Date  . Vaginal hysterectomy    . Nasal sinus surgery    . Cystocele repair N/A 01/08/2013    Procedure: ANTERIOR REPAIR (CYSTOCELE);  Surgeon: Meriel Pica, MD;  Location: [facility];  Service: Gynecology;  Laterality: N/A;  . Cosmetic surgery      FAMILY HISTORY: Family History  Problem Relation Age of Onset  . Heart disease Mother   . Peptic Ulcer Disease Mother   . Heart disease Father   . Parkinson's disease Father     SOCIAL HISTORY:  Social History   Social History  . Marital Status: Married    Spouse Name: Ruth Gregory  .  Number of Children: 2  . Years of Education: college   Occupational History  . DESIGNER    Social History Main Topics  . Smoking status: Never Smoker   . Smokeless tobacco: Never Used  . Alcohol Use: 0.0 oz/week     Comment: occassionally drinks a glass of wine  . Drug Use: No  . Sexual Activity: Not on file   Other Topics Concern  . 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 Patient is married and lives with her husband. Patient has college education.   Right handed.   Caffeine- two cups daily.     PHYSICAL EXAM   Filed Vitals:   01/29/15 0942  BP: 136/87  Pulse: 82  Height: 5' 8.75" (1.746 m)  Weight: 157 lb (71.215 kg)    Not recorded      Body mass index is 23.36 kg/(m^2).  PHYSICAL EXAMNIATION:  Gen: NAD, conversant, well nourised, obese, well groomed                     Cardiovascular: Regular rate rhythm, no peripheral edema, warm, nontender. Eyes: Conjunctivae clear without exudates or hemorrhage Neck: Supple, no carotid bruise. Pulmonary: Clear to auscultation bilaterally   NEUROLOGICAL EXAM:  MENTAL STATUS: Speech:    Speech is normal; fluent and spontaneous with normal comprehension.  Cognition:     Orientation to time, place and person     Normal recent and remote  memory     Normal Attention span and concentration     Normal Language, naming, repeating,spontaneous speech     Fund of knowledge   CRANIAL NERVES: CN II: Visual fields are full to confrontation. Fundoscopic exam is normal with sharp discs and no vascular changes. Pupils are round equal and briskly reactive to light. CN III, IV, VI: extraocular movement are normal. No ptosis. CN V: Facial sensation is intact to pinprick in all 3 divisions bilaterally. Corneal responses are intact.  CN VII: Face is symmetric with normal eye closure and smile. CN VIII: Hearing is normal to rubbing fingers CN IX, X: Palate elevates symmetrically. Phonation is normal. CN XI: Head turning and shoulder shrug are intact CN XII: Tongue is midline with normal movements and no atrophy.  MOTOR: Mild spastic left hemiparesis, left upper extremity proximal and distal 4/5, mild left hip flexion and left ankle dorsiflexion weakness  REFLEXES: Reflexes are 2+ and symmetric at the biceps, triceps, knees, and ankles. Plantar responses are flexor.  SENSORY: Intact to light touch, pinprick, position sense, and vibration sense are intact in fingers and toes.  COORDINATION: Rapid alternating movements and fine finger movements are intact. There is no dysmetria on finger-to-nose and heel-knee-shin.    GAIT/STANCE: Mildly unsteady gait, checking her left foot across the floor  Romberg is absent.   DIAGNOSTIC DATA (LABS, IMAGING, TESTING) - I reviewed patient records, labs, notes, testing and imaging myself where available.   ASSESSMENT AND PLAN  Suzann Lazaro is a 55 y.o. female    Chronic migraine  Continue preventive medications Topamax ER 150 mg every night  Magnesium oxide 400 mg, riboflavin 100 mg twice a day  Cambia, Compazine as needed, may consider Fioricet, and hydrocodone as rescue therapy  I have suggested Botox injection as migraine prevention Spastic left hemiparesis  electric stimulation guided Botox  injection every 3 months  Levert Feinstein, M.D. Ph.D.  [facility] [address], [address] [place], [place]  [id number] Ph: [phone] Fax: [phone]  CC: Referring Provider

## 2015-02-13 ENCOUNTER — Other Ambulatory Visit: Payer: Self-pay

## 2015-02-13 DIAGNOSIS — Z1231 Encounter for screening mammogram for malignant neoplasm of breast: Secondary | ICD-10-CM

## 2015-02-17 ENCOUNTER — Other Ambulatory Visit: Payer: Self-pay | Admitting: Internal Medicine

## 2015-02-18 ENCOUNTER — Other Ambulatory Visit: Payer: Self-pay | Admitting: Internal Medicine

## 2015-02-18 ENCOUNTER — Other Ambulatory Visit: Payer: Self-pay | Admitting: Physician Assistant

## 2015-02-19 ENCOUNTER — Other Ambulatory Visit: Payer: Self-pay | Admitting: Physician Assistant

## 2015-02-19 MED ORDER — AMPHETAMINE-DEXTROAMPHETAMINE 10 MG PO TABS: ORAL_TABLET | ORAL | Status: DC

## 2015-02-26 ENCOUNTER — Other Ambulatory Visit: Payer: Self-pay | Admitting: Physician Assistant

## 2015-02-26 ENCOUNTER — Other Ambulatory Visit: Payer: Self-pay | Admitting: Internal Medicine

## 2015-02-27 ENCOUNTER — Telehealth: Payer: Self-pay | Admitting: Neurology

## 2015-02-27 NOTE — Telephone Encounter (Signed)
Called patient and left a VM requesting her to call me back to schedule her next BOTOX inj.

## 2015-03-10 NOTE — Telephone Encounter (Signed)
Patient returned Danielle's call °

## 2015-03-11 NOTE — Telephone Encounter (Signed)
Patient would like to know if she can be worked in for botox before the end of the year. She would also like to cancel her follow up apt because she may be out of town but she requested that I keep it on the schedule until i talked to [place] RN regarding her botox apt. I am calling [facility] today, her medication should be here by tomorrow.

## 2015-03-11 NOTE — Telephone Encounter (Signed)
Pt sts she ordered botox yesterday and needs an appt for December. She will have different insurance effective 03/30/15. Please call at [phone]

## 2015-03-12 NOTE — Telephone Encounter (Signed)
Pt called inquiring if appt was setup for botox. She can be reached after 10:30 on 03/13/15.

## 2015-03-17 ENCOUNTER — Other Ambulatory Visit: Payer: Self-pay | Admitting: Internal Medicine

## 2015-03-17 NOTE — Telephone Encounter (Signed)
Called patient and left a message asking her to call me. Also spoke with [facility] and they stated that they were waiting on the patient to call them back and give consent to ship medication.

## 2015-03-17 NOTE — Telephone Encounter (Signed)
Ruth C. Spoke with the patient and asked her is the 28th would be ok for her injection. She agreed and I changed her FU to a BOTOX apt.

## 2015-03-17 NOTE — Telephone Encounter (Signed)
Pt called requesting to schedule appt

## 2015-03-20 ENCOUNTER — Ambulatory Visit
Admission: RE | Admit: 2015-03-20 | Discharge: 2015-03-20 | Disposition: A | Discharge status: Home / Self Care | Payer: BLUE CROSS/BLUE SHIELD | Source: Ambulatory Visit

## 2015-03-20 DIAGNOSIS — Z1231 Encounter for screening mammogram for malignant neoplasm of breast: Secondary | ICD-10-CM

## 2015-03-26 ENCOUNTER — Ambulatory Visit (INDEPENDENT_AMBULATORY_CARE_PROVIDER_SITE_OTHER): Payer: BLUE CROSS/BLUE SHIELD | Admitting: Neurology

## 2015-03-26 ENCOUNTER — Encounter: Payer: Self-pay | Admitting: Neurology

## 2015-03-26 VITALS — BP 149/100 | HR 88 | Resp 16 | Ht 68.75 in | Wt 157.0 lb

## 2015-03-26 DIAGNOSIS — IMO0002 Reserved for concepts with insufficient information to code with codable children: Secondary | ICD-10-CM

## 2015-03-26 DIAGNOSIS — G8114 Spastic hemiplegia affecting left nondominant side: Secondary | ICD-10-CM | POA: Diagnosis not present

## 2015-03-26 DIAGNOSIS — G43709 Chronic migraine without aura, not intractable, without status migrainosus: Secondary | ICD-10-CM

## 2015-03-26 MED ORDER — HYDROCODONE-ACETAMINOPHEN 5-325 MG PO TABS: 1.0000 | ORAL_TABLET | ORAL | Status: DC | PRN

## 2015-03-26 NOTE — Progress Notes (Signed)
Chief Complaint  Patient presents with  . L spastic hemiparesis    Rm 4. Botox injections. Botox 100U x3. LOT [id number], Exp 8/19, NDC [address]-14, Spec Pharm: [facility]      PATIENT: Ruth Gregory DOB: 12-01-1960  Chief Complaint  Patient presents with  . L spastic hemiparesis    Rm 4. Botox injections. Botox 100U x3. LOT [id number], Exp 8/19, NDC [address]-69, Spec Pharm: [facility]     HISTORICAL  Ruth Gregory is a 54 year old right-handed female  History of right internal carotid section with spastic left hemiparesis, Botox injection every 3 months  She has history of right internal carotid artery dissection following an episode of vomiting from migraine headache in mid June 2009. She was given IV TPA and found to have right MCA occlusion. She underwent emergent right carotid artery stent with distal endovascular recanalization of the middle cerebral artery.  She had small hemorrhagic transformation of the right basal ganglia with mild left hemiparesis, but has done well since then and has been independent.  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  She has been receiving BOTOX injection since 01/2009 every 3 months, to her left upper extremity and also left lower extremity, responded well.  Repeat US carotid were normal in October 2013  Chronic migraine headaches: She had long-standing history of chronic migraine, average 2-4 headaches each months, she has increased  frequency headaches around fall, and spring, she is now taking Topamax ER 150 mg every night as preventive medication, magnesium oxide 400 mg, riboflavin 100 mg twice a day,  She is not candidate for triptan because previous history of stroke, Cambia works well for her, she her insurance only allow her to have 9 tablets each months. Occasionally she take Fioricet, hydrocodone as needed, even prednisone 20 mg as needed as rescue therapy  Over the past few weeks, she been having to 3 headaches each week, used out her cambia, she also complains of neck muscle tightness, receiving chiropractor sometimes   Last Botox injection to spastic left upper extremity works well, but she noticed left hand muscle weakness.  UPDATE Mar 26 2015: She responded very well to previous Botox injection in September 2016, but noticed increased left hand weakness, gradually recovered over the past few weeks  Review of system: Pertinent as above   ALLERGIES: Allergies  Allergen Reactions  . Penicillins Anaphylaxis and Hives  . Imitrex [Sumatriptan]   . Other Swelling    kiwi  . Sulfa Antibiotics Hives  . Zomig [Zolmitriptan]     HOME MEDICATIONS: Current Outpatient Prescriptions  Medication Sig Dispense Refill  . ALPRAZolam (XANAX) 1 MG tablet TAKE 1 TABLET BY MOUTH 3 TIMES A DAY AS NEEDED 90 tablet 1  . amphetamine-dextroamphetamine (ADDERALL) 10 MG tablet Take 1/2 to 1 tablet 1 or 2 x daily for ADD 60 tablet 0  . aspirin 325 MG EC tablet Take 325 mg by mouth daily.    . Botulinum Toxin Type A (BOTOX) 200 UNITS SOLR Inject as directed. Change  to BOTOX A 100  units    . buPROPion (WELLBUTRIN XL) 300 MG 24 hr tablet Take 1 tablet (300 mg total) by mouth daily. with food 90 tablet 3  . butalbital-acetaminophen-caffeine (FIORICET, ESGIC) 50-325-40 MG tablet TAKE 1 TABLET BY MOUTH EVERY 6 HOURS AS NEEDED FOR HEADACHE 30 tablet 1  . CAMBIA 50 MG PACK TAKE 1 PACKET BY MOUTH AS NEEDED. 30 each 11  . cetirizine  (ZYRTEC) 10 MG tablet Take 10 mg by mouth daily.    . CRESTOR 40 MG tablet TAKE 1 TABLET BY MOUTH EVERY DAY (Patient taking differently: TAKE 1 TABLET BY MOUTH EVERY OTHER DAY) 30 tablet 2  . ESTRING 2 MG vaginal ring Place 2 mg vaginally every 3 (three) months.     . fenofibrate micronized (LOFIBRA) 134 MG capsule TAKE 1 CAPSULE BY MOUTH EVERY DAY 30 capsule 5  . HYDROcodone-acetaminophen (NORCO/VICODIN) 5-325 MG tablet Take 1 tablet by mouth every 4 (four) hours as needed for moderate pain. 30 tablet 0  . levothyroxine (SYNTHROID, LEVOTHROID) 100 MCG tablet TAKE 1 TABLET BY MOUTH EVERY DAY 90 tablet 3  . magnesium oxide (MAG-OX) 400 MG tablet Take 400 mg by mouth 2 (two) times daily.     . montelukast (SINGULAIR) 10 MG tablet TAKE 1 TABLET BY MOUTH AT BEDTIME 30 tablet 99  . Multiple Vitamin (MULTIVITAMIN WITH MINERALS) TABS tablet Take 1 tablet by mouth daily.    Marland Kitchen omeprazole (PRILOSEC) 40 MG capsule TAKE 1 CAPSULE (40 MG TOTAL) BY MOUTH DAILY. FOR ACID INDIGESTION AND REFLUX 90 capsule 1  . predniSONE (DELTASONE) 20 MG tablet Take 1 tablet per day as needed for headaches. 90 tablet 1  . prochlorperazine (COMPAZINE) 5 MG tablet TAKE 1 TABLET BY MOUTH 3 TIMES A DAY AS NEEDED FOR NAUSEA 50 tablet 1  . PROVENTIL HFA 108 (90 BASE) MCG/ACT inhaler INHALE 2 PUFFS EVERY 4 HOURS AS NEEDED FOR ASTHMA RESCUE 6.7 Inhaler 0  . Riboflavin 100 MG TABS Take 100 mg by mouth. bid    . Topiramate ER 150 MG CS24 1 pill daily 30 each 3  . vitamin B-12 (CYANOCOBALAMIN) 1000 MCG tablet Take 1,000 mcg by mouth daily.    . Vitamin D, Ergocalciferol, (DRISDOL) 50000 UNITS CAPS capsule TAKE 1 CAPSULE DAILY 30 capsule 1   No current facility-administered medications for this visit.    PAST MEDICAL HISTORY: Past Medical History  Diagnosis Date  . Anxiety   . Spasm of muscle   . Occlusion and stenosis of carotid artery with cerebral infarction   . Headache(784.0)   . Unspecified cerebral artery occlusion with  cerebral infarction   . Elevated cholesterol     on Crestor  . Depression   . Hypothyroidism     S/P thyroidectomy  . Asthma   . Chronic heartburn   . Hypertension   . Anemia   . Migraine   . Vitamin D deficiency     PAST SURGICAL HISTORY: Past Surgical History  Procedure Laterality Date  . Vaginal hysterectomy    . Nasal sinus surgery    . Cystocele repair N/A 01/08/2013    Procedure: ANTERIOR REPAIR (CYSTOCELE);  Surgeon: Meriel Pica, MD;  Location: [facility];  Service: Gynecology;  Laterality: N/A;  . Cosmetic surgery      FAMILY HISTORY: Family History  Problem Relation Age of Onset  . Heart disease Mother   . Peptic Ulcer Disease Mother   . Heart disease Father   . Parkinson's disease  Father     SOCIAL HISTORY:  Social History   Social History  . Marital Status: Married    Spouse Name: Brett CanalesSteve  . Number of Children: 2  . Years of Education: college   Occupational History  . DESIGNER    Social History Main Topics  . Smoking status: Never Smoker   . Smokeless tobacco: Never Used  . Alcohol Use: 0.0 oz/week     Comment: occassionally drinks a glass of wine  . Drug Use: No  . Sexual Activity: Not on file   Other Topics Concern  . Not on file   Social History Narrative   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 Patient is married and lives with her husband. Patient has college education.   Right handed.   Caffeine- two cups daily.     PHYSICAL EXAM   Filed Vitals:   03/26/15 1228  BP: 149/100  Pulse: 88  Resp: 16  Height: 5' 8.75" (1.746 m)  Weight: 157 lb (71.215 kg)    Not recorded      Body mass index is 23.36 kg/(m^2).  PHYSICAL EXAMNIATION:  Gen: NAD, conversant, well nourised, obese, well groomed                     Cardiovascular: Regular rate rhythm, no peripheral edema, warm, nontender. Eyes: Conjunctivae clear without exudates or hemorrhage Neck: Supple, no carotid bruise. Pulmonary: Clear to auscultation  bilaterally   NEUROLOGICAL EXAM:  MENTAL STATUS: Speech:    Speech is normal; fluent and spontaneous with normal comprehension.  Cognition:     Orientation to time, place and person     Normal recent and remote memory     Normal Attention span and concentration     Normal Language, naming, repeating,spontaneous speech     Fund of knowledge   CRANIAL NERVES: CN II: Visual fields are full to confrontation. Fundoscopic exam is normal with sharp discs and no vascular changes. Pupils are round equal and briskly reactive to light. CN III, IV, VI: extraocular movement are normal. No ptosis. CN V: Facial sensation is intact to pinprick in all 3 divisions bilaterally. Corneal responses are intact.  CN VII: Face is symmetric with normal eye closure and smile. CN VIII: Hearing is normal to rubbing fingers CN IX, X: Palate elevates symmetrically. Phonation is normal. CN XI: Head turning and shoulder shrug are intact CN XII: Tongue is midline with normal movements and no atrophy.  MOTOR: Mild spastic left hemiparesis, left upper extremity proximal and distal 4/5, mild left hip flexion and left ankle dorsiflexion weakness  REFLEXES: Reflexes are 2+ and symmetric at the biceps, triceps, knees, and ankles. Plantar responses are flexor.  SENSORY: Intact to light touch, pinprick, position sense, and vibration sense are intact in fingers and toes.  COORDINATION: Rapid alternating movements and fine finger movements are intact. There is no dysmetria on finger-to-nose and heel-knee-shin.    GAIT/STANCE: Mildly unsteady gait, checking her left foot across the floor  Romberg is absent.   DIAGNOSTIC DATA (LABS, IMAGING, TESTING) - I reviewed patient records, labs, notes, testing and imaging myself where available.   ASSESSMENT AND PLAN  Ruth CornKim Loll is a 54 y.o. female   Spastic left hemiparesis following right internal carotid artery dissection, right MCA stroke  Electric stimulation guided  Botox injection, used a 300 units of Botox A  Left pronator teres 25 units Left flexor carpi ulnaris 25 units Left flexor digitorum profoundi 25 units Left  flexor carpi radialis 25 units  Left brachialis 50 units Left pectoralis major 25 units Left teres major 25 units Left upper trapezius 25 units Left levator scapula 25 unit  Left tibialis posterior 25 units Left flexor digitorum longus 25 units  Levert Feinstein, M.D. Ph.D.  [facility] [address], [address] [place], [place] [address] Ph: [phone] Fax: [phone]  CC: Referring Provider

## 2015-03-29 ENCOUNTER — Other Ambulatory Visit: Payer: Self-pay | Admitting: Internal Medicine

## 2015-04-09 ENCOUNTER — Ambulatory Visit (INDEPENDENT_AMBULATORY_CARE_PROVIDER_SITE_OTHER): Payer: BLUE CROSS/BLUE SHIELD | Admitting: Internal Medicine

## 2015-04-09 ENCOUNTER — Encounter: Payer: Self-pay | Admitting: Internal Medicine

## 2015-04-09 VITALS — BP 126/68 | HR 90 | Temp 98.2°F | Resp 18 | Ht 68.75 in

## 2015-04-09 DIAGNOSIS — J069 Acute upper respiratory infection, unspecified: Secondary | ICD-10-CM

## 2015-04-09 MED ORDER — AZITHROMYCIN 250 MG PO TABS: ORAL_TABLET | ORAL | Status: DC

## 2015-04-09 MED ORDER — FLUTICASONE PROPIONATE 50 MCG/ACT NA SUSP: 2.0000 | Freq: Every day | NASAL | Status: DC

## 2015-04-09 MED ORDER — PREDNISONE 20 MG PO TABS: ORAL_TABLET | ORAL | Status: DC

## 2015-04-09 NOTE — Patient Instructions (Signed)
Please take prednisone 2 tablets per day for 3 days.  Take 1 tablet a day for 3 days.  Please take 1/2 tablet for 3 days.  Please use saline as often as tolerated.   Please call the neurology office and see if it is okay for you to use flonase.  If you get a green light 2 sprays per nostril.  Please continue to take the antihistamine.  Please take the zpak until it is gone.

## 2015-04-09 NOTE — Progress Notes (Signed)
Patient ID: Ruth Gregory, female   DOB: 02/21/1961, 55 y.o.   MRN: [id number]  HPI  Patient presents to the office for evaluation of cough.  It has been going on for 1 weeks.  Patient reports night > day, dry, barky, worse with lying down.  They also endorse change in voice, postnasal drip and sinus congestion, sinus pressure, ear pain.  .  They have tried antihistamines or prednisone, mucinex dm.  They report that nothing has worked.  They denies other sick contacts.  She does tend to have a bad reaction to allergies.  She reports that her nose is producing green nasal sputum.  She has been taking prednisone 60 mg 3 days, 40 mg currently.  She reports that this is helping.   Review of Systems  Constitutional: Positive for chills and malaise/fatigue. Negative for fever.  HENT: Positive for congestion. Negative for ear pain, nosebleeds and sore throat.   Respiratory: Positive for cough and sputum production. Negative for shortness of breath and wheezing.   Cardiovascular: Negative for chest pain, palpitations and leg swelling.  Neurological: Negative for headaches.    PE:  Filed Vitals:   04/09/15 1122  BP: 126/68  Pulse: 90  Temp: 98.2 F (36.8 C)  Resp: 18     General:  Alert and non-toxic, WDWN, NAD HEENT: NCAT, PERLA, EOM normal, no occular discharge or erythema.  Nasal mucosal edema with sinus tenderness to palpation.  Oropharynx clear with minimal oropharyngeal edema and erythema.  Mucous membranes moist and pink. Neck:  Cervical adenopathy Chest:  RRR no MRGs.  Lungs clear to auscultation A&P with no wheezes rhonchi or rales.   Abdomen: +BS x 4 quadrants, soft, non-tender, no guarding, rigidity, or rebound. Skin: warm and dry no rash Neuro: A&Ox4, CN II-XII grossly intact  Assessment and Plan:   1. Acute URI - predniSONE (DELTASONE) 20 MG tablet; Take 1 tablet per day as needed for headaches.  Dispense: 90 tablet; Refill: 1 - azithromycin (ZITHROMAX Z-PAK) 250 MG tablet; 2 po  day one, then 1 daily x 4 days  Dispense: 6 tablet; Refill: 0 - fluticasone (FLONASE) 50 MCG/ACT nasal spray; Place 2 sprays into both nostrils daily.  Dispense: 16 g; Refill: 0 -daily antihistamine -tylenol or advil prn

## 2015-04-14 ENCOUNTER — Other Ambulatory Visit: Payer: Self-pay | Admitting: Physician Assistant

## 2015-04-21 ENCOUNTER — Encounter: Payer: Self-pay | Admitting: Internal Medicine

## 2015-04-21 ENCOUNTER — Other Ambulatory Visit: Payer: Self-pay | Admitting: *Deleted

## 2015-04-21 ENCOUNTER — Ambulatory Visit (INDEPENDENT_AMBULATORY_CARE_PROVIDER_SITE_OTHER): Payer: BLUE CROSS/BLUE SHIELD | Admitting: Internal Medicine

## 2015-04-21 VITALS — BP 112/78 | HR 88 | Temp 97.5°F | Resp 16 | Ht 68.5 in | Wt 156.2 lb

## 2015-04-21 DIAGNOSIS — Z111 Encounter for screening for respiratory tuberculosis: Secondary | ICD-10-CM

## 2015-04-21 DIAGNOSIS — Z0000 Encounter for general adult medical examination without abnormal findings: Secondary | ICD-10-CM

## 2015-04-21 DIAGNOSIS — R5383 Other fatigue: Secondary | ICD-10-CM

## 2015-04-21 DIAGNOSIS — G43019 Migraine without aura, intractable, without status migrainosus: Secondary | ICD-10-CM

## 2015-04-21 DIAGNOSIS — Z79899 Other long term (current) drug therapy: Secondary | ICD-10-CM | POA: Diagnosis not present

## 2015-04-21 DIAGNOSIS — R7303 Prediabetes: Secondary | ICD-10-CM

## 2015-04-21 DIAGNOSIS — Z1212 Encounter for screening for malignant neoplasm of rectum: Secondary | ICD-10-CM

## 2015-04-21 DIAGNOSIS — I10 Essential (primary) hypertension: Secondary | ICD-10-CM | POA: Diagnosis not present

## 2015-04-21 DIAGNOSIS — Z0001 Encounter for general adult medical examination with abnormal findings: Secondary | ICD-10-CM

## 2015-04-21 DIAGNOSIS — R030 Elevated blood-pressure reading, without diagnosis of hypertension: Secondary | ICD-10-CM

## 2015-04-21 DIAGNOSIS — I63031 Cerebral infarction due to thrombosis of right carotid artery: Secondary | ICD-10-CM

## 2015-04-21 DIAGNOSIS — E559 Vitamin D deficiency, unspecified: Secondary | ICD-10-CM

## 2015-04-21 DIAGNOSIS — E782 Mixed hyperlipidemia: Secondary | ICD-10-CM

## 2015-04-21 DIAGNOSIS — F988 Other specified behavioral and emotional disorders with onset usually occurring in childhood and adolescence: Secondary | ICD-10-CM

## 2015-04-21 LAB — CBC WITH DIFFERENTIAL/PLATELET
Basophils Absolute: 0 10*3/uL (ref 0.0–0.1)
Basophils Relative: 0 % (ref 0–1)
Eosinophils Absolute: 0.1 10*3/uL (ref 0.0–0.7)
Eosinophils Relative: 1 % (ref 0–5)
HCT: 44 % (ref 36.0–46.0)
Hemoglobin: 14.8 g/dL (ref 12.0–15.0)
Lymphocytes Relative: 36 % (ref 12–46)
Lymphs Abs: 2.4 10*3/uL (ref 0.7–4.0)
MCH: 32.2 pg (ref 26.0–34.0)
MCHC: 33.6 g/dL (ref 30.0–36.0)
MCV: 95.7 fL (ref 78.0–100.0)
MPV: 10.5 fL (ref 8.6–12.4)
Monocytes Absolute: 0.6 10*3/uL (ref 0.1–1.0)
Monocytes Relative: 9 % (ref 3–12)
Neutro Abs: 3.6 10*3/uL (ref 1.7–7.7)
Neutrophils Relative %: 54 % (ref 43–77)
Platelets: 225 10*3/uL (ref 150–400)
RBC: 4.6 MIL/uL (ref 3.87–5.11)
RDW: 13.3 % (ref 11.5–15.5)
WBC: 6.7 10*3/uL (ref 4.0–10.5)

## 2015-04-21 MED ORDER — AMPHETAMINE-DEXTROAMPHETAMINE 10 MG PO TABS: ORAL_TABLET | ORAL | Status: DC

## 2015-04-21 NOTE — Progress Notes (Signed)
Patient ID: Ruth Gregory, female   DOB: Oct 18, 1960, 55 y.o.   MRN: [id number]  Annual Screening/Preventative Visit And Comprehensive Evaluation &  Examination  This very nice 55 y.o. MWF  presents for presents for a Wellness/Preventative Visit & comprehensive evaluation and management of multiple medical co-morbidities.  Patient has been followed for HTN, Prediabetes, Hyperlipidemia and Vitamin D Deficiency.    HTN predates since 2008 . Patient's BP has been controlled at home and patient denies any cardiac symptoms as chest pain, palpitations, shortness of breath, dizziness or ankle swelling.  In June 2009, she had a R MCA /CVA/thrombosis thought due to a dissection from suspected fibromuscular dysplasia. She had a mild L HP with sequelae of mild contracturing of the RUE for which she receives Botox injections by Dr Terrace Arabia. Today's BP: 112/78 mmHg. Patient has also had difficulty focusing and concentrating and staying on task since her CVA and does seem to improve in these sx's on Adderall.    Patient's hyperlipidemia is controlled with diet and medications. Patient denies myalgias or other medication SE's. Last lipids were at goal with Cholesterol 165; HDL 52; LDL 79; but sl elevated Triglycerides 168 on 10/24/2014:   Patient is screened for prediabetes and patient denies reactive hypoglycemic symptoms, visual blurring, diabetic polys, or paresthesias. Last A1c was 5.8% on  10/24/2014.   Finally, patient has history of Vitamin D Deficiency of "23" in 2008 and last Vitamin D was 122 on 10/24/2014 and she was advised to taper her dose.   Medication Sig  . ALPRAZolam  1 MG  Take 1/2 to 1 tablet 3 x day if needed for anxiety  . aspirin 325 MG EC  Take 325 mg by mouth daily.  . Botulinum Toxin Type A (BOTOX) 200 UNITS  Inject as directed. Change to BOTOX A 100  units  . buPROPion- XL 300 MG  TAKE 1 TABLET (300 MG TOTAL) BY MOUTH DAILY. WITH FOOD  . FIORICET 50-325-40 MG TAKE 1 TABLET BY MOUTH EVERY 6 HOURS AS  NEEDED FOR HEADACHE  . CAMBIA 50 MG PACK TAKE 1 PACKET BY MOUTH AS NEEDED.  Marland Kitchen cetirizine  10 MG tablet Take 10 mg by mouth daily.  . CRESTOR 40 MG  TAKE 1 TABLET BY MOUTH EVERY DAY (Patient taking differently: TAKE 1 TABLET BY MOUTH EVERY OTHER DAY)  . ESTRING 2 MG vaginal ring Place 2 mg vaginally every 3 (three) months.   . fenofibrate  134 MG cap TAKE 1 CAPSULE BY MOUTH EVERY DAY  . fFLONASE 5 nasal spray Place 2 sprays into both nostrils daily.  Marland Kitchen VICODIN5-325 MG Take 1 tablet by mouth every 4 (four) hours as needed for moderate pain.  Marland Kitchen levothyroxine  100 MCG  TAKE 1 TABLET BY MOUTH EVERY DAY  . magnesium  400 MG  Take 400 mg by mouth 2 (two) times daily.   Marland Kitchen SINGULAIR 10 MG  TAKE 1 TABLET BY MOUTH AT BEDTIME  . MULTIVITAMIN WITH MIN Take 1 tablet by mouth daily.  Marland Kitchen omeprazole  40 MG cap TAKE 1 CAPSULE (40 MG TOTAL) BY MOUTH DAILY. FOR ACID INDIGESTION AND REFLUX  . predniSONE  20 MG  Take 1 tablet per day as needed for headaches.  . prochlorperazine  5 MG tab TAKE 1 TABLET BY MOUTH 3 TIMES A DAY AS NEEDED FOR NAUSEA  . PROVENTIL HFA  inhaler INHALE 2 PUFFS EVERY 4 HOURS AS NEEDED FOR ASTHMA RESCUE  . Riboflavin 100 MG TABS Take 100 mg by  mouth. bid  . Topiramate ER 150 MG CS24 1 pill daily  . vitamin B-12  1000 MCG Take 1,000 mcg by mouth daily.  . Vitamin D 50,000 UNITS  TAKE 1 CAPSULE DAILY   Allergies  Allergen Reactions  . Penicillins Anaphylaxis and Hives  . Imitrex [Sumatriptan]   . Other Swelling    kiwi  . Sulfa Antibiotics Hives  . Zomig [Zolmitriptan]    Past Medical History  Diagnosis Date  . Anxiety   . Spasm of muscle   . Occlusion and stenosis of carotid artery with cerebral infarction   . Headache(784.0)   . Unspecified cerebral artery occlusion with cerebral infarction   . Elevated cholesterol     on Crestor  . Depression   . Hypothyroidism     S/P thyroidectomy  . Asthma   . Chronic heartburn   . Hypertension   . Anemia   . Migraine   . Vitamin D  deficiency    Health Maintenance  Topic Date Due  . COLONOSCOPY  11/02/2008  . TETANUS/TDAP  03/29/2012  . INFLUENZA VACCINE  10/28/2014  . MAMMOGRAM  03/19/2017  . PAP SMEAR  05/07/2017  . Hepatitis C Screening  Completed  . HIV Screening  Completed   Immunization History  Administered Date(s) Administered  . Influenza Split 01/24/2014  . Influenza-Unspecified 11/28/2011  . PPD Test 04/04/2013  . Pneumococcal Polysaccharide-23 03/29/1998  . Td 03/29/2002   Past Surgical History  Procedure Laterality Date  . Vaginal hysterectomy    . Nasal sinus surgery    . Cystocele repair N/A 01/08/2013    Procedure: ANTERIOR REPAIR (CYSTOCELE);  Surgeon: Meriel Pica, MD;  Location: [facility];  Service: Gynecology;  Laterality: N/A;  . Cosmetic surgery     Family History  Problem Relation Age of Onset  . Heart disease Mother   . Peptic Ulcer Disease Mother   . Heart disease Father   . Parkinson's disease Father    Social History  Substance Use Topics  . Smoking status: Never Smoker   . Smokeless tobacco: Never Used  . Alcohol Use: 0.0 oz/week     Comment: occassionally drinks a glass of wine    ROS Constitutional: Denies fever, chills, weight loss/gain, headaches, insomnia,  night sweats, and change in appetite. Does c/o fatigue. Eyes: Denies redness, blurred vision, diplopia, discharge, itchy, watery eyes.  ENT: Denies discharge, congestion, post nasal drip, epistaxis, sore throat, earache, hearing loss, dental pain, Tinnitus, Vertigo, Sinus pain, snoring.  Cardio: Denies chest pain, palpitations, irregular heartbeat, syncope, dyspnea, diaphoresis, orthopnea, PND, claudication, edema Respiratory: denies cough, dyspnea, DOE, pleurisy, hoarseness, laryngitis, wheezing.  Gastrointestinal: Denies dysphagia, heartburn, reflux, water brash, pain, cramps, nausea, vomiting, bloating, diarrhea, constipation, hematemesis, melena, hematochezia, jaundice, hemorrhoids Genitourinary: Denies  dysuria, frequency, urgency, nocturia, hesitancy, discharge, hematuria, flank pain Breast: Breast lumps, nipple discharge, bleeding.  Musculoskeletal: Denies arthralgia, myalgia, stiffness, Jt. Swelling, pain, limp, and strain/sprain. Denies falls. Skin: Denies puritis, rash, hives, warts, acne, eczema, changing in skin lesion Neuro: No weakness, tremor, incoordination, spasms, paresthesia, pain Psychiatric: Denies confusion, memory loss, sensory loss. Denies Depression. Endocrine: Denies change in weight, skin, hair change, nocturia, and paresthesia, diabetic polys, visual blurring, hyper / hypo glycemic episodes.  Heme/Lymph: No excessive bleeding, bruising, enlarged lymph nodes.  Physical Exam  BP 112/78 mmHg  Pulse 88  Temp(Src) 97.5 F (36.4 C)  Resp 16  Ht 5' 8.5" (1.74 m)  Wt 156 lb 3.2 oz (70.852 kg)  BMI 23.40 kg/m2  General Appearance: Well nourished and in no apparent distress. Eyes: PERRLA, EOMs, conjunctiva no swelling or erythema, normal fundi and vessels. Sinuses: No frontal/maxillary tenderness ENT/Mouth: EACs patent / TMs  nl. Nares clear without erythema, swelling, mucoid exudates. Oral hygiene is good. No erythema, swelling, or exudate. Tongue normal, non-obstructing. Tonsils not swollen or erythematous. Hearing normal.  Neck: Supple, thyroid normal. No bruits, nodes or JVD. Respiratory: Respiratory effort normal.  BS equal and clear bilateral without rales, rhonci, wheezing or stridor. Cardio: Heart sounds are normal with regular rate and rhythm and no murmurs, rubs or gallops. Peripheral pulses are normal and equal bilaterally without edema. No aortic or femoral bruits. Chest: symmetric with normal excursions and percussion. Breasts: Symmetric, without lumps, nipple discharge, retractions, or fibrocystic changes.  Abdomen: Flat, soft, with bowl sounds. Nontender, no guarding, rebound, hernias, masses, or organomegaly.  Lymphatics: Non tender without lymphadenopathy.   Genitourinary:  Musculoskeletal: Mild contracturing of the LUE & hyperactive LUE DTR's. Gait is normal.  Skin: Warm and dry without rashes, lesions, cyanosis, clubbing or  ecchymosis.  Neuro: Cranial nerves intact, reflexes equal bilaterally. Normal muscle tone, no cerebellar symptoms. Sensation intact.  Pysch: Alert and oriented X 3, normal affect, Insight and Judgment appropriate.   Assessment and Plan  1. Annual Preventative Screening Examination  - Microalbumin / creatinine urine ratio - EKG 12-Lead - [place], RETROPERITNL ABD,  LTD - POC Hemoccult Bld/Stl (3-Cd Home Screen); Future - Urinalysis, Routine w reflex microscopic (not at [facility]) - Vitamin B12 - Iron and TIBC - CBC with Differential/Platelet - BASIC METABOLIC PANEL WITH GFR - Hepatic function panel - Magnesium - Lipid panel - TSH - Hemoglobin A1c - Insulin, random - VITAMIN D 25 Hydroxy (V  2. Elevated blood pressure reading without diagnosis of hypertension  - Microalbumin / creatinine urine ratio - EKG 12-Lead - [place], RETROPERITNL ABD,  LTD - TSH  3. Mixed hyperlipidemia  - Lipid panel - TSH  4. Prediabetes  - Hemoglobin A1c - Insulin, random  5. Vitamin D deficiency  - VITAMIN D 25 Hydroxy   6. Cerebrovascular accident (CVA) due to thrombosis of right carotid artery (HCC)   7. Intractable migraine without aura and without status migrainosus   8. ADD (attention deficit disorder)   9. Screening for rectal cancer  - POC Hemoccult Bld/Stl   10. Other fatigue  - Vitamin B12 - Iron and TIBC  11. Medication management  - Urinalysis, Routine w reflex microscopic  - CBC with Differential/Platelet - BASIC METABOLIC PANEL WITH GFR - Hepatic function panel - Magnesium   Continue prudent diet as discussed, weight control, BP monitoring, regular exercise, and medications. Discussed med's effects and SE's. Screening labs and tests as requested with regular follow-up as recommended. Over 40 minutes  of exam, counseling, chart review and high complex critical decision making was performed.

## 2015-04-21 NOTE — Patient Instructions (Signed)
Recommend Adult Low Dose Aspirin or   coated  Aspirin 81 mg daily   To reduce risk of Colon Cancer 20 %,   Skin Cancer 26 % ,   Melanoma 46%   and   Pancreatic cancer 60%   ++++++++++++++++++++++++++++++++++++++++++++++++++++++  Vitamin D goal   is between 70-100.   Please make sure that you are taking your Vitamin D as directed.   It is very important as a natural anti-inflammatory   helping hair, skin, and nails, as well as reducing stroke and heart attack risk.   It helps your bones and helps with mood.  It also decreases numerous cancer risks so please take it as directed.   Low Vit D is associated with a 200-300% higher risk for CANCER   and 200-300% higher risk for HEART   ATTACK  &  STROKE.   ......................................  It is also associated with higher death rate at younger ages,   autoimmune diseases like Rheumatoid arthritis, Lupus, Multiple Sclerosis.     Also many other serious conditions, like depression, Alzheimer's  Dementia, infertility, muscle aches, fatigue, fibromyalgia - just to name a few.  ++++++++++++++++++++++++++++++++++++++++++++++++  Recommend the book "The END of DIETING" by Dr Joel Fuhrman   & the book "The END of DIABETES " by Dr Joel Fuhrman  At [employer] - get book & Audio CD's     Being diabetic has a  300% increased risk for heart attack, stroke, cancer, and alzheimer- type vascular dementia. It is very important that you work harder with diet by avoiding all foods that are white. Avoid white rice (brown & wild rice is OK), white potatoes (sweetpotatoes in moderation is OK), White bread or wheat bread or anything made out of white flour like bagels, donuts, rolls, buns, biscuits, cakes, pastries, cookies, pizza crust, and pasta (made from white flour & egg whites) - vegetarian pasta or spinach or wheat pasta is OK. Multigrain breads like Arnold's or [employer], or multigrain sandwich thins or flatbreads.  Diet,  exercise and weight loss can reverse and cure diabetes in the early stages.  Diet, exercise and weight loss is very important in the control and prevention of complications of diabetes which affects every system in your body, ie. Brain - dementia/stroke, eyes - glaucoma/blindness, heart - heart attack/heart failure, kidneys - dialysis, stomach - gastric paralysis, intestines - malabsorption, nerves - severe painful neuritis, circulation - gangrene & loss of a leg(s), and finally cancer and Alzheimers.    I recommend avoid fried & greasy foods,  sweets/candy, white rice (brown or wild rice or Quinoa is OK), white potatoes (sweet potatoes are OK) - anything made from white flour - bagels, doughnuts, rolls, buns, biscuits,white and wheat breads, pizza crust and traditional pasta made of white flour & egg white(vegetarian pasta or spinach or wheat pasta is OK).  Multi-grain bread is OK - like multi-grain flat bread or sandwich thins. Avoid alcohol in excess. Exercise is also important.    Eat all the vegetables you want - avoid meat, especially red meat and dairy - especially cheese.  Cheese is the most concentrated form of trans-fats which is the worst thing to clog up our arteries. Veggie cheese is OK which can be found in the fresh produce section at [employer] or [employer] or [employer]  ++++++++++++++++++++++++++++++++++++++++++++++++++ DASH Eating Plan  DASH stands for "Dietary Approaches to Stop Hypertension."   The DASH eating plan is a healthy eating plan that has been shown to reduce high   blood pressure (hypertension). Additional health benefits may include reducing the risk of type 2 diabetes mellitus, heart disease, and stroke. The DASH eating plan may also help with weight loss.  WHAT DO I NEED TO KNOW ABOUT THE DASH EATING PLAN? For the DASH eating plan, you will follow these general guidelines:  Choose foods with a percent daily value for sodium of less than 5% (as listed on the food  label).  Use salt-free seasonings or herbs instead of table salt or sea salt.  Check with your health care provider or pharmacist before using salt substitutes.  Eat lower-sodium products, often labeled as "lower sodium" or "no salt added."  Eat fresh foods.  Eat more vegetables, fruits, and low-fat dairy products.    Choose whole grains. Look for the word "whole" as the first word in the ingredient list.  Choose fish   Limit sweets, desserts, sugars, and sugary drinks.  Choose heart-healthy fats.  Eat veggie cheese   Eat more home-cooked food and less restaurant, buffet, and fast food.  Limit fried foods.  Cook foods using methods other than frying.  Limit canned vegetables. If you do use them, rinse them well to decrease the sodium.  When eating at a restaurant, ask that your food be prepared with less salt, or no salt if possible.                      WHAT FOODS CAN I EAT?  Seek help from a dietitian for individual calorie needs.  Grains Whole grain or whole wheat bread. Brown rice. Whole grain or whole wheat pasta. Quinoa, bulgur, and whole grain cereals. Low-sodium cereals. Corn or whole wheat flour tortillas. Whole grain cornbread. Whole grain crackers. Low-sodium crackers.  Vegetables Fresh or frozen vegetables (raw, steamed, roasted, or grilled). Low-sodium or reduced-sodium tomato and vegetable juices. Low-sodium or reduced-sodium tomato sauce and paste. Low-sodium or reduced-sodium canned vegetables.   Fruits All fresh, canned (in natural juice), or frozen fruits.  Protein Products  All fish and seafood.  Dried beans, peas, or lentils. Unsalted nuts and seeds. Unsalted canned beans.  Dairy Low-fat dairy products, such as skim or 1% milk, 2% or reduced-fat cheeses, low-fat ricotta or cottage cheese, or plain low-fat yogurt. Low-sodium or reduced-sodium cheeses.  Fats and Oils Tub margarines without trans fats. Light or reduced-fat mayonnaise and salad  dressings (reduced sodium). Avocado. Safflower, olive, or canola oils. Natural peanut or almond butter.  Other Unsalted popcorn and pretzels. The items listed above may not be a complete list of recommended foods or beverages. Contact your dietitian for more options.  +++++++++++++++++++++++++++++++++++++++++++  WHAT FOODS ARE NOT RECOMMENDED?  Grains/ White flour or wheat flour  White bread. White pasta. White rice. Refined cornbread. Bagels and croissants. Crackers that contain trans fat.  Vegetables  Creamed or fried vegetables. Vegetables in a . Regular canned vegetables. Regular canned tomato sauce and paste. Regular tomato and vegetable juices.  Fruits Dried fruits. Canned fruit in light or heavy syrup. Fruit juice.  Meat and Other Protein Products Meat in general. Fatty cuts of meat. Ribs, chicken wings, bacon, sausage, bologna, salami, chitterlings, fatback, hot dogs, bratwurst, and packaged luncheon meats.  Dairy Whole or 2% milk, cream, half-and-half, and cream cheese. Whole-fat or sweetened yogurt. Full-fat cheeses or blue cheese. Nondairy creamers and whipped toppings. Processed cheese, cheese spreads, or cheese curds.  Condiments Onion and garlic salt, seasoned salt, table salt, and sea salt. Canned and packaged gravies. Worcestershire sauce. Tartar sauce.  Barbecue sauce. Teriyaki sauce. Soy sauce, including reduced sodium. Steak sauce. Fish sauce. Oyster sauce. Cocktail sauce. Horseradish. Ketchup and mustard. Meat flavorings and tenderizers. Bouillon cubes. Hot sauce. Tabasco sauce. Marinades. Taco seasonings. Relishes.  Fats and Oils Butter, stick margarine, lard, shortening, ghee, and bacon fat. Coconut, palm kernel, or palm oils. Regular salad dressings.  Pickles and olives. Salted popcorn and pretzels. The items listed above may not be a complete list of foods and beverages to avoid.   Preventive Care for Adults  A healthy lifestyle and preventive care can  promote health and wellness. Preventive health guidelines for women include the following key practices.  A routine yearly physical is a good way to check with your health care provider about your health and preventive screening. It is a chance to share any concerns and updates on your health and to receive a thorough exam.  Visit your dentist for a routine exam and preventive care every 6 months. Brush your teeth twice a day and floss once a day. Good oral hygiene prevents tooth decay and gum disease.  The frequency of eye exams is based on your age, health, family medical history, use of contact lenses, and other factors. Follow your health care provider's recommendations for frequency of eye exams.  Eat a healthy diet. Foods like vegetables, fruits, whole grains, low-fat dairy products, and lean protein foods contain the nutrients you need without too many calories. Decrease your intake of foods high in solid fats, added sugars, and salt. Eat the right amount of calories for you.Get information about a proper diet from your health care provider, if necessary.  Regular physical exercise is one of the most important things you can do for your health. Most adults should get at least 150 minutes of moderate-intensity exercise (any activity that increases your heart rate and causes you to sweat) each week. In addition, most adults need muscle-strengthening exercises on 2 or more days a week.  Maintain a healthy weight. The body mass index (BMI) is a screening tool to identify possible weight problems. It provides an estimate of body fat based on height and weight. Your health care provider can find your BMI and can help you achieve or maintain a healthy weight.For adults 20 years and older:  A BMI below 18.5 is considered underweight.  A BMI of 18.5 to 24.9 is normal.  A BMI of 25 to 29.9 is considered overweight.  A BMI of 30 and above is considered obese.  Maintain normal blood lipids and  cholesterol levels by exercising and minimizing your intake of saturated fat. Eat a balanced diet with plenty of fruit and vegetables. Blood tests for lipids and cholesterol should begin at age 31 and be repeated every 5 years. If your lipid or cholesterol levels are high, you are over 50, or you are at high risk for heart disease, you may need your cholesterol levels checked more frequently.Ongoing high lipid and cholesterol levels should be treated with medicines if diet and exercise are not working.  If you smoke, find out from your health care provider how to quit. If you do not use tobacco, do not start.  Lung cancer screening is recommended for adults aged 83-80 years who are at high risk for developing lung cancer because of a history of smoking. A yearly low-dose CT scan of the lungs is recommended for people who have at least a 30-pack-year history of smoking and are a current smoker or have quit within the past 15 years.  A pack year of smoking is smoking an average of 1 pack of cigarettes a day for 1 year (for example: 1 pack a day for 30 years or 2 packs a day for 15 years). Yearly screening should continue until the smoker has stopped smoking for at least 15 years. Yearly screening should be stopped for people who develop a health problem that would prevent them from having lung cancer treatment.  High blood pressure causes heart disease and increases the risk of stroke. Your blood pressure should be checked at least every 1 to 2 years. Ongoing high blood pressure should be treated with medicines if weight loss and exercise do not work.  If you are 61-30 years old, ask your health care provider if you should take aspirin to prevent strokes.  Diabetes screening involves taking a blood sample to check your fasting blood sugar level. This should be done once every 3 years, after age 9, if you are within normal weight and without risk factors for diabetes. Testing should be considered at a  younger age or be carried out more frequently if you are overweight and have at least 1 risk factor for diabetes.  Breast cancer screening is essential preventive care for women. You should practice "breast self-awareness." This means understanding the normal appearance and feel of your breasts and may include breast self-examination. Any changes detected, no matter how small, should be reported to a health care provider. Women in their 74s and 30s should have a clinical breast exam (CBE) by a health care provider as part of a regular health exam every 1 to 3 years. After age 75, women should have a CBE every year. Starting at age 19, women should consider having a mammogram (breast X-ray test) every year. Women who have a family history of breast cancer should talk to their health care provider about genetic screening. Women at a high risk of breast cancer should talk to their health care providers about having an MRI and a mammogram every year.  Breast cancer gene (BRCA)-related cancer risk assessment is recommended for women who have family members with BRCA-related cancers. BRCA-related cancers include breast, ovarian, tubal, and peritoneal cancers. Having family members with these cancers may be associated with an increased risk for harmful changes (mutations) in the breast cancer genes BRCA1 and BRCA2. Results of the assessment will determine the need for genetic counseling and BRCA1 and BRCA2 testing.  Routine pelvic exams to screen for cancer are no longer recommended for nonpregnant women who are considered low risk for cancer of the pelvic organs (ovaries, uterus, and vagina) and who do not have symptoms. Ask your health care provider if a screening pelvic exam is right for you.  If you have had past treatment for cervical cancer or a condition that could lead to cancer, you need Pap tests and screening for cancer for at least 20 years after your treatment. If Pap tests have been discontinued, your  risk factors (such as having a new sexual partner) need to be reassessed to determine if screening should be resumed. Some women have medical problems that increase the chance of getting cervical cancer. In these cases, your health care provider may recommend more frequent screening and Pap tests.  Colorectal cancer can be detected and often prevented. Most routine colorectal cancer screening begins at the age of 23 years and continues through age 42 years. However, your health care provider may recommend screening at an earlier age if you have risk factors for colon  cancer. On a yearly basis, your health care provider may provide home test kits to check for hidden blood in the stool. Use of a small camera at the end of a tube, to directly examine the colon (sigmoidoscopy or colonoscopy), can detect the earliest forms of colorectal cancer. Talk to your health care provider about this at age 50, when routine screening begins. Direct exam of the colon should be repeated every 5-10 years through age 75 years, unless early forms of pre-cancerous polyps or small growths are found.  Hepatitis C blood testing is recommended for all people born from 1945 through 1965 and any individual with known risks for hepatitis C.  Pra  Osteoporosis is a disease in which the bones lose minerals and strength with aging. This can result in serious bone fractures or breaks. The risk of osteoporosis can be identified using a bone density scan. Women ages 65 years and over and women at risk for fractures or osteoporosis should discuss screening with their health care providers. Ask your health care provider whether you should take a calcium supplement or vitamin D to reduce the rate of osteoporosis.  Menopause can be associated with physical symptoms and risks. Hormone replacement therapy is available to decrease symptoms and risks. You should talk to your health care provider about whether hormone replacement therapy is right  for you.  Use sunscreen. Apply sunscreen liberally and repeatedly throughout the day. You should seek shade when your shadow is shorter than you. Protect yourself by wearing long sleeves, pants, a wide-brimmed hat, and sunglasses year round, whenever you are outdoors.  Once a month, do a whole body skin exam, using a mirror to look at the skin on your back. Tell your health care provider of new moles, moles that have irregular borders, moles that are larger than a pencil eraser, or moles that have changed in shape or color.  Stay current with required vaccines (immunizations).  Influenza vaccine. All adults should be immunized every year.  Tetanus, diphtheria, and acellular pertussis (Td, Tdap) vaccine. Pregnant women should receive 1 dose of Tdap vaccine during each pregnancy. The dose should be obtained regardless of the length of time since the last dose. Immunization is preferred during the 27th-36th week of gestation. An adult who has not previously received Tdap or who does not know her vaccine status should receive 1 dose of Tdap. This initial dose should be followed by tetanus and diphtheria toxoids (Td) booster doses every 10 years. Adults with an unknown or incomplete history of completing a 3-dose immunization series with Td-containing vaccines should begin or complete a primary immunization series including a Tdap dose. Adults should receive a Td booster every 10 years.  Varicella vaccine. An adult without evidence of immunity to varicella should receive 2 doses or a second dose if she has previously received 1 dose. Pregnant females who do not have evidence of immunity should receive the first dose after pregnancy. This first dose should be obtained before leaving the health care facility. The second dose should be obtained 4-8 weeks after the first dose.  Human papillomavirus (HPV) vaccine. Females aged 13-26 years who have not received the vaccine previously should obtain the 3-dose  series. The vaccine is not recommended for use in pregnant females. However, pregnancy testing is not needed before receiving a dose. If a female is found to be pregnant after receiving a dose, no treatment is needed. In that case, the remaining doses should be delayed until after the pregnancy. Immunization   is recommended for any person with an immunocompromised condition through the age of 26 years if she did not get any or all doses earlier. During the 3-dose series, the second dose should be obtained 4-8 weeks after the first dose. The third dose should be obtained 24 weeks after the first dose and 16 weeks after the second dose.  Zoster vaccine. One dose is recommended for adults aged 60 years or older unless certain conditions are present.  Measles, mumps, and rubella (MMR) vaccine. Adults born before 1957 generally are considered immune to measles and mumps. Adults born in 1957 or later should have 1 or more doses of MMR vaccine unless there is a contraindication to the vaccine or there is laboratory evidence of immunity to each of the three diseases. A routine second dose of MMR vaccine should be obtained at least 28 days after the first dose for students attending postsecondary schools, health care workers, or international travelers. People who received inactivated measles vaccine or an unknown type of measles vaccine during 1963-1967 should receive 2 doses of MMR vaccine. People who received inactivated mumps vaccine or an unknown type of mumps vaccine before 1979 and are at high risk for mumps infection should consider immunization with 2 doses of MMR vaccine. For females of childbearing age, rubella immunity should be determined. If there is no evidence of immunity, females who are not pregnant should be vaccinated. If there is no evidence of immunity, females who are pregnant should delay immunization until after pregnancy. Unvaccinated health care workers born before 1957 who lack laboratory  evidence of measles, mumps, or rubella immunity or laboratory confirmation of disease should consider measles and mumps immunization with 2 doses of MMR vaccine or rubella immunization with 1 dose of MMR vaccine.  Pneumococcal 13-valent conjugate (PCV13) vaccine. When indicated, a person who is uncertain of her immunization history and has no record of immunization should receive the PCV13 vaccine. An adult aged 19 years or older who has certain medical conditions and has not been previously immunized should receive 1 dose of PCV13 vaccine. This PCV13 should be followed with a dose of pneumococcal polysaccharide (PPSV23) vaccine. The PPSV23 vaccine dose should be obtained at least 8 weeks after the dose of PCV13 vaccine. An adult aged 19 years or older who has certain medical conditions and previously received 1 or more doses of PPSV23 vaccine should receive 1 dose of PCV13. The PCV13 vaccine dose should be obtained 1 or more years after the last PPSV23 vaccine dose.    Pneumococcal polysaccharide (PPSV23) vaccine. When PCV13 is also indicated, PCV13 should be obtained first. All adults aged 65 years and older should be immunized. An adult younger than age 65 years who has certain medical conditions should be immunized. Any person who resides in a nursing home or long-term care facility should be immunized. An adult smoker should be immunized. People with an immunocompromised condition and certain other conditions should receive both PCV13 and PPSV23 vaccines. People with human immunodeficiency virus (HIV) infection should be immunized as soon as possible after diagnosis. Immunization during chemotherapy or radiation therapy should be avoided. Routine use of PPSV23 vaccine is not recommended for American Indians, [place] Natives, or people younger than 65 years unless there are medical conditions that require PPSV23 vaccine. When indicated, people who have unknown immunization and have no record of immunization  should receive PPSV23 vaccine. One-time revaccination 5 years after the first dose of PPSV23 is recommended for people aged 19-64 years who have   chronic kidney failure, nephrotic syndrome, asplenia, or immunocompromised conditions. People who received 1-2 doses of PPSV23 before age 28 years should receive another dose of PPSV23 vaccine at age 64 years or later if at least 5 years have passed since the previous dose. Doses of PPSV23 are not needed for people immunized with PPSV23 at or after age 65 years.  Preventive Services / Frequency   Ages 52 to 76 years  Blood pressure check.  Lipid and cholesterol check.  Lung cancer screening. / Every year if you are aged 62-80 years and have a 30-pack-year history of smoking and currently smoke or have quit within the past 15 years. Yearly screening is stopped once you have quit smoking for at least 15 years or develop a health problem that would prevent you from having lung cancer treatment.  Clinical breast exam.** / Every year after age 38 years.  BRCA-related cancer risk assessment.** / For women who have family members with a BRCA-related cancer (breast, ovarian, tubal, or peritoneal cancers).  Mammogram.** / Every year beginning at age 69 years and continuing for as long as you are in good health. Consult with your health care provider.  Pap test.** / Every 3 years starting at age 29 years through age 47 or 54 years with a history of 3 consecutive normal Pap tests.  HPV screening.** / Every 3 years from ages 1 years through ages 108 to 42 years with a history of 3 consecutive normal Pap tests.  Fecal occult blood test (FOBT) of stool. / Every year beginning at age 36 years and continuing until age 71 years. You may not need to do this test if you get a colonoscopy every 10 years.  Flexible sigmoidoscopy or colonoscopy.** / Every 5 years for a flexible sigmoidoscopy or every 10 years for a colonoscopy beginning at age 65 years and continuing  until age 21 years.  Hepatitis C blood test.** / For all people born from [birth year] through 1965 and any individual with known risks for hepatitis C.  Skin self-exam. / Monthly.  Influenza vaccine. / Every year.  Tetanus, diphtheria, and acellular pertussis (Tdap/Td) vaccine.** / Consult your health care provider. Pregnant women should receive 1 dose of Tdap vaccine during each pregnancy. 1 dose of Td every 10 years.  Varicella vaccine.** / Consult your health care provider. Pregnant females who do not have evidence of immunity should receive the first dose after pregnancy.  Zoster vaccine.** / 1 dose for adults aged 69 years or older.  Pneumococcal 13-valent conjugate (PCV13) vaccine.** / Consult your health care provider.  Pneumococcal polysaccharide (PPSV23) vaccine.** / 1 to 2 doses if you smoke cigarettes or if you have certain conditions.  Meningococcal vaccine.** / Consult your health care provider.  Hepatitis A vaccine.** / Consult your health care provider.  Hepatitis B vaccine.** / Consult your health care provider. Screening for abdominal aortic aneurysm (AAA)  by ultrasound is recommended for people over 50 who have history of high blood pressure or who are current or former smokers.

## 2015-04-22 DIAGNOSIS — Z111 Encounter for screening for respiratory tuberculosis: Secondary | ICD-10-CM

## 2015-04-22 LAB — HEPATIC FUNCTION PANEL
ALT: 32 U/L — ABNORMAL HIGH (ref 6–29)
AST: 13 U/L (ref 10–35)
Albumin: 4.3 g/dL (ref 3.6–5.1)
Alkaline Phosphatase: 43 U/L (ref 33–130)
Bilirubin, Direct: 0.1 mg/dL (ref <=0.2)
Indirect Bilirubin: 0.4 mg/dL (ref 0.2–1.2)
Total Bilirubin: 0.5 mg/dL (ref 0.2–1.2)
Total Protein: 6.4 g/dL (ref 6.1–8.1)

## 2015-04-22 LAB — BASIC METABOLIC PANEL WITH GFR
BUN: 21 mg/dL (ref 7–25)
CO2: 25 mmol/L (ref 20–31)
Calcium: 9.3 mg/dL (ref 8.6–10.4)
Chloride: 111 mmol/L — ABNORMAL HIGH (ref 98–110)
Creat: 1.16 mg/dL — ABNORMAL HIGH (ref 0.50–1.05)
GFR, Est African American: 61 mL/min (ref >=60)
GFR, Est Non African American: 53 mL/min — ABNORMAL LOW (ref >=60)
Glucose, Bld: 87 mg/dL (ref 65–99)
Potassium: 3.9 mmol/L (ref 3.5–5.3)
Sodium: 147 mmol/L — ABNORMAL HIGH (ref 135–146)

## 2015-04-22 LAB — HEMOGLOBIN A1C
Hgb A1c MFr Bld: 5.8 % — ABNORMAL HIGH (ref <5.7)
Mean Plasma Glucose: 120 mg/dL — ABNORMAL HIGH (ref <117)

## 2015-04-22 LAB — TSH: TSH: 3.035 u[IU]/mL (ref 0.350–4.500)

## 2015-04-22 LAB — URINALYSIS, ROUTINE W REFLEX MICROSCOPIC
Bilirubin Urine: NEGATIVE
Glucose, UA: NEGATIVE
Hgb urine dipstick: NEGATIVE
Ketones, ur: NEGATIVE
Leukocytes, UA: NEGATIVE
Nitrite: NEGATIVE
Protein, ur: NEGATIVE
Specific Gravity, Urine: 1.016 (ref 1.001–1.035)
pH: 7 (ref 5.0–8.0)

## 2015-04-22 LAB — LIPID PANEL
Cholesterol: 191 mg/dL (ref 125–200)
HDL: 60 mg/dL (ref >=46)
LDL Cholesterol: 93 mg/dL (ref <130)
Total CHOL/HDL Ratio: 3.2 Ratio (ref <=5.0)
Triglycerides: 189 mg/dL — ABNORMAL HIGH (ref <150)
VLDL: 38 mg/dL — ABNORMAL HIGH (ref <30)

## 2015-04-22 LAB — IRON AND TIBC
%SAT: 55 % — ABNORMAL HIGH (ref 11–50)
Iron: 189 ug/dL — ABNORMAL HIGH (ref 45–160)
TIBC: 341 ug/dL (ref 250–450)
UIBC: 152 ug/dL (ref 125–400)

## 2015-04-22 LAB — MAGNESIUM: Magnesium: 1.8 mg/dL (ref 1.5–2.5)

## 2015-04-22 LAB — VITAMIN D 25 HYDROXY (VIT D DEFICIENCY, FRACTURES): Vit D, 25-Hydroxy: 117 ng/mL — ABNORMAL HIGH (ref 30–100)

## 2015-04-22 LAB — INSULIN, RANDOM: Insulin: 49.3 u[IU]/mL — ABNORMAL HIGH (ref 2.0–19.6)

## 2015-04-22 LAB — MICROALBUMIN / CREATININE URINE RATIO
Creatinine, Urine: 111 mg/dL (ref 20–320)
Microalb Creat Ratio: 4 mcg/mg creat (ref <30)
Microalb, Ur: 0.4 mg/dL

## 2015-04-22 LAB — VITAMIN B12: Vitamin B-12: 449 pg/mL (ref 211–911)

## 2015-04-22 NOTE — Addendum Note (Signed)
Addended by: Valrie Hart C on: 04/22/2015 08:10 AM   Modules accepted: Orders

## 2015-05-24 ENCOUNTER — Other Ambulatory Visit: Payer: Self-pay | Admitting: Internal Medicine

## 2015-06-11 ENCOUNTER — Other Ambulatory Visit: Payer: Self-pay | Admitting: *Deleted

## 2015-06-11 ENCOUNTER — Other Ambulatory Visit: Payer: Self-pay | Admitting: Physician Assistant

## 2015-06-11 DIAGNOSIS — Z0001 Encounter for general adult medical examination with abnormal findings: Secondary | ICD-10-CM

## 2015-06-11 DIAGNOSIS — Z1212 Encounter for screening for malignant neoplasm of rectum: Secondary | ICD-10-CM

## 2015-06-11 LAB — POC HEMOCCULT BLD/STL (HOME/3-CARD/SCREEN)
Card #2 Fecal Occult Blod, POC: NEGATIVE
Card #3 Fecal Occult Blood, POC: NEGATIVE
Fecal Occult Blood, POC: NEGATIVE

## 2015-06-17 ENCOUNTER — Telehealth: Payer: Self-pay | Admitting: Neurology

## 2015-06-17 NOTE — Telephone Encounter (Signed)
Returned call and left a VM asking the patient to call me back.

## 2015-06-17 NOTE — Telephone Encounter (Signed)
Pt called and appt scheduled for 4/4 @ 1:30. Patient is requesting a call from [place]. She has questions about the shipment of botox.  She does not know how much it will cost.

## 2015-06-17 NOTE — Telephone Encounter (Signed)
Pt called to schedule appt for botox

## 2015-06-19 ENCOUNTER — Telehealth: Payer: Self-pay | Admitting: Neurology

## 2015-06-19 DIAGNOSIS — G43709 Chronic migraine without aura, not intractable, without status migrainosus: Secondary | ICD-10-CM

## 2015-06-19 DIAGNOSIS — IMO0002 Reserved for concepts with insufficient information to code with codable children: Secondary | ICD-10-CM

## 2015-06-19 MED ORDER — HYDROCODONE-ACETAMINOPHEN 5-325 MG PO TABS: 1.0000 | ORAL_TABLET | ORAL | Status: DC | PRN

## 2015-06-19 NOTE — Telephone Encounter (Signed)
Rx placed up front for pick up. 

## 2015-06-19 NOTE — Telephone Encounter (Signed)
Pt called requesting refill for HYDROcodone-acetaminophen (NORCO/VICODIN) 5-325 MG tablet . °

## 2015-06-26 DIAGNOSIS — Z0289 Encounter for other administrative examinations: Secondary | ICD-10-CM

## 2015-06-26 NOTE — Telephone Encounter (Signed)
Called patient and left a VM asking her to return my call.  °

## 2015-07-01 ENCOUNTER — Telehealth: Payer: Self-pay | Admitting: Neurology

## 2015-07-01 ENCOUNTER — Other Ambulatory Visit: Payer: Self-pay | Admitting: *Deleted

## 2015-07-01 ENCOUNTER — Ambulatory Visit: Payer: BLUE CROSS/BLUE SHIELD | Admitting: Neurology

## 2015-07-01 NOTE — Telephone Encounter (Signed)
Left message for Ruth BattenKim letting her know we received her paperwork and would do our best to have it completed by her appt.

## 2015-07-01 NOTE — Telephone Encounter (Signed)
Pt said she dropped off [employer] disability form last week, said she pd $25 also. She is inquiring if the form could be ready for her appt on Thursday 07/03/15

## 2015-07-02 ENCOUNTER — Telehealth: Payer: Self-pay | Admitting: Neurology

## 2015-07-02 NOTE — Telephone Encounter (Signed)
Spoke with the patient regarding her botox injections. Informed her that pharmacy was refusing payment and the patient informed me she has a new insurance policy affective 06/28/15. Will start PA and pharmacy process over again and schedule the patient once she has been approved. Patient has requested to speak with someone regarding her disability papers and would like to know when they will be ready to pick up. Please call and advise.

## 2015-07-02 NOTE — Telephone Encounter (Signed)
Insurance : New [employer] Member Name Ruth Gregory,  Member ID: [id number], Group # [id number], BC/BF plan: 363/865 , RX Group: Lorrin [id number] , RXBin [id number]  May call pt if you need more info. [phone] Pt is supposed to come in for botox

## 2015-07-02 NOTE — Telephone Encounter (Signed)
Left Ruth Gregory a message letting her know her paperwork has been completed and returned to [place] in medical records.

## 2015-07-03 ENCOUNTER — Ambulatory Visit: Payer: BLUE CROSS/BLUE SHIELD | Admitting: Neurology

## 2015-07-09 DIAGNOSIS — G8114 Spastic hemiplegia affecting left nondominant side: Secondary | ICD-10-CM | POA: Diagnosis not present

## 2015-07-14 ENCOUNTER — Telehealth: Payer: Self-pay | Admitting: Neurology

## 2015-07-14 NOTE — Telephone Encounter (Signed)
Ruth Gregory states she woke up on 07/13/15 with a migraine.  She has taken the following medications with limited relief: Xanax, Fioricet, Hydrocodone, Cambia and a total dosage of Prednisone 30mg .  She is requesting treatment with IV medications (allergies verified in chart).  She has never had IV treatment in the past for her migraines.  States her husband will be able to drive her.

## 2015-07-14 NOTE — Telephone Encounter (Signed)
Dr. Terrace ArabiaYan spoke directly with the patient.  Ruth Gregory has decided to not come in today - feels migraine is starting to improve.  She will keep her pending appt on 07/15/15 for Botox injections.

## 2015-07-14 NOTE — Telephone Encounter (Signed)
Patient is calling and states that she has had a migraine for a couple of days and that she would like to have information about having an infusion today.  Please call.

## 2015-07-15 ENCOUNTER — Encounter: Payer: Self-pay | Admitting: Neurology

## 2015-07-15 ENCOUNTER — Ambulatory Visit (INDEPENDENT_AMBULATORY_CARE_PROVIDER_SITE_OTHER): Payer: BLUE CROSS/BLUE SHIELD | Admitting: Neurology

## 2015-07-15 VITALS — BP 135/92 | HR 83 | Ht 68.5 in | Wt 157.0 lb

## 2015-07-15 DIAGNOSIS — G43019 Migraine without aura, intractable, without status migrainosus: Secondary | ICD-10-CM | POA: Diagnosis not present

## 2015-07-15 DIAGNOSIS — G8114 Spastic hemiplegia affecting left nondominant side: Secondary | ICD-10-CM | POA: Diagnosis not present

## 2015-07-15 MED ORDER — TOPIRAMATE ER 150 MG PO SPRINKLE CAP24: 1.0000 | EXTENDED_RELEASE_CAPSULE | Freq: Every day | ORAL | Status: DC

## 2015-07-15 MED ORDER — BUTALBITAL-APAP-CAFFEINE 50-325-40 MG PO TABS: ORAL_TABLET | ORAL | Status: DC

## 2015-07-15 NOTE — Patient Instructions (Signed)
[email or web address]

## 2015-07-15 NOTE — Progress Notes (Signed)
Chief Complaint  Patient presents with  . Spastic Hemiplegia    Botox 100 units x 3 vials - specialty pharmacy. States she has had a migraine for three days but it is starting to improve with her home medications.      PATIENT: Ruth Gregory DOB: 05-Dec-1960  Chief Complaint  Patient presents with  . Spastic Hemiplegia    Botox 100 units x 3 vials - specialty pharmacy. States she has had a migraine for three days but it is starting to improve with her home medications.     HISTORICAL  Ruth Gregory is a 55 year old right-handed female  History of right internal carotid section with spastic left hemiparesis, Botox injection every 3 months  She has history of right internal carotid artery dissection following an episode of vomiting from migraine headache in mid June 2009. She was given IV TPA and found to have right MCA occlusion. She underwent emergent right carotid artery stent with distal endovascular recanalization of the middle cerebral artery.  She had small hemorrhagic transformation of the right basal ganglia with mild left hemiparesis, but has done well since then and has been independent.  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  She has been receiving BOTOX injection since 01/2009 every 3 months, to her left upper extremity and also left lower extremity, responded well.  Repeat US carotid were normal in October 2013  Chronic migraine headaches: She had long-standing history of  chronic migraine, average 2-4 headaches each months, she has increased frequency headaches around fall, and spring, she is now taking Topamax ER 150 mg every night as preventive medication, magnesium oxide 400 mg, riboflavin 100 mg twice a day,  She is not candidate for triptan because previous history of stroke, Cambia works well for her, she her insurance only allow her to have 9 tablets each months. Occasionally she take Fioricet, hydrocodone as needed, even prednisone 20 mg as needed as rescue therapy  Over the past few weeks, she been having to 3 headaches each week, used out her cambia, she also complains of neck muscle tightness, receiving chiropractor sometimes   Last Botox injection to spastic left upper extremity works well, but she noticed left hand muscle weakness.  UPDATE Mar 26 2015: She responded very well to previous Botox injection in September 2016, but noticed increased left hand weakness, gradually recovered over the past few weeks  Update July 15 2015: She responded very well to previous Botox injection in December 20 eighth 2016, noticed returning of left finger flexion spasticity, left ankle plantarflexion, want today's injection emphasize on above abnormal posturing.  She still has frequent migraine headaches, 1-2 headache each week, some headache was protracted, lasting 1-2 days, require multiple dose of cambia and Fioricet   Review of system: Pertinent as above   ALLERGIES: Allergies  Allergen Reactions  . Penicillins Anaphylaxis and Hives  . Imitrex [Sumatriptan]   . Other Swelling    kiwi  . Sulfa Antibiotics Hives  . Zomig [Zolmitriptan]  HOME MEDICATIONS: Current Outpatient Prescriptions  Medication Sig Dispense Refill  . ALPRAZolam (XANAX) 1 MG tablet Take 1/2 to 1 tablet 3 x day if needed for anxiety 270 tablet 1  . aspirin 325 MG EC tablet Take 325 mg by mouth daily.    . Botulinum Toxin Type A (BOTOX) 200 UNITS SOLR Inject as directed. Change  to BOTOX A 100  units    . buPROPion (WELLBUTRIN XL) 300 MG 24 hr tablet TAKE 1 TABLET (300 MG TOTAL) BY MOUTH DAILY. WITH FOOD 90 tablet 3  . butalbital-acetaminophen-caffeine (FIORICET, ESGIC) 50-325-40 MG tablet TAKE 1 TABLET BY MOUTH EVERY 6 HOURS AS NEEDED FOR HEADACHE 30 tablet 1  . CAMBIA 50 MG PACK TAKE 1 PACKET BY MOUTH AS NEEDED. 30 each 11  . cetirizine (ZYRTEC) 10 MG tablet Take 10 mg by mouth daily.    . CRESTOR 40 MG tablet TAKE 1 TABLET BY MOUTH EVERY DAY (Patient taking differently: TAKE 1 TABLET BY MOUTH EVERY OTHER DAY) 30 tablet 2  . ESTRING 2 MG vaginal ring Place 2 mg vaginally every 3 (three) months.     . fenofibrate micronized (LOFIBRA) 134 MG capsule TAKE 1 CAPSULE BY MOUTH EVERY DAY 30 capsule 5  . fluticasone (FLONASE) 50 MCG/ACT nasal spray PLACE 2 SPRAYS INTO BOTH NOSTRILS DAILY. 48 g 1  . HYDROcodone-acetaminophen (NORCO/VICODIN) 5-325 MG tablet Take 1 tablet by mouth every 4 (four) hours as needed for moderate pain. 30 tablet 0  . levothyroxine (SYNTHROID, LEVOTHROID) 100 MCG tablet TAKE 1 TABLET BY MOUTH EVERY DAY 90 tablet 3  . magnesium oxide (MAG-OX) 400 MG tablet Take 400 mg by mouth 2 (two) times daily.     . montelukast (SINGULAIR) 10 MG tablet TAKE 1 TABLET BY MOUTH AT BEDTIME 30 tablet 99  . Multiple Vitamin (MULTIVITAMIN WITH MINERALS) TABS tablet Take 1 tablet by mouth daily.    Marland Kitchen omeprazole (PRILOSEC) 40 MG capsule TAKE 1 CAPSULE (40 MG TOTAL) BY MOUTH DAILY. FOR ACID INDIGESTION AND REFLUX 90 capsule 1  . predniSONE (DELTASONE) 20 MG tablet Take 1 tablet per day as needed for headaches. 90 tablet 1  . prochlorperazine (COMPAZINE) 5 MG tablet TAKE 1 TABLET BY MOUTH 3 TIMES A DAY AS NEEDED FOR NAUSEA 50 tablet 1  . PROVENTIL HFA 108 (90 BASE) MCG/ACT inhaler INHALE 2 PUFFS EVERY 4 HOURS AS NEEDED FOR ASTHMA RESCUE 6.7 Inhaler 0  . QUDEXY XR 150 MG CS24 TAKE 1 CAPSULE EVERY DAY 30 each 3  . Riboflavin 100 MG TABS Take 100 mg by mouth. bid    . vitamin B-12  (CYANOCOBALAMIN) 1000 MCG tablet Take 1,000 mcg by mouth daily.    . Vitamin D, Ergocalciferol, (DRISDOL) 50000 UNITS CAPS capsule TAKE 1 CAPSULE DAILY 30 capsule 1  . amphetamine-dextroamphetamine (ADDERALL) 10 MG tablet Take 1/2 to 1 tablet 1 or 2 x daily for ADD 60 tablet 0   No current facility-administered medications for this visit.    PAST MEDICAL HISTORY: Past Medical History  Diagnosis Date  . Anxiety   . Spasm of muscle   . Occlusion and stenosis of carotid artery with cerebral infarction   . Headache(784.0)   . Unspecified cerebral artery occlusion with cerebral infarction   . Elevated cholesterol     on Crestor  . Depression   . Hypothyroidism     S/P thyroidectomy  . Asthma   . Chronic heartburn   . Hypertension   . Anemia   . Migraine   .  Vitamin D deficiency     PAST SURGICAL HISTORY: Past Surgical History  Procedure Laterality Date  . Vaginal hysterectomy    . Nasal sinus surgery    . Cystocele repair N/A 01/08/2013    Procedure: ANTERIOR REPAIR (CYSTOCELE);  Surgeon: Meriel Pica, MD;  Location: [facility];  Service: Gynecology;  Laterality: N/A;  . Cosmetic surgery      FAMILY HISTORY: Family History  Problem Relation Age of Onset  . Heart disease Mother   . Peptic Ulcer Disease Mother   . Heart disease Father   . Parkinson's disease Father     SOCIAL HISTORY:  Social History   Social History  . Marital Status: Married    Spouse Name: Brett Canales  . Number of Children: 2  . Years of Education: college   Occupational History  . DESIGNER    Social History Main Topics  . Smoking status: Never Smoker   . Smokeless tobacco: Never Used  . Alcohol Use: 0.0 oz/week     Comment: occassionally drinks a glass of wine  . Drug Use: No  . Sexual Activity: Not on file   Other Topics Concern  . 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 Patient is married and lives with her husband. Patient has  college education.   Right handed.   Caffeine- two cups daily.     PHYSICAL EXAM   Filed Vitals:   07/15/15 1540  BP: 135/92  Pulse: 83  Height: 5' 8.5" (1.74 m)  Weight: 157 lb (71.215 kg)    Not recorded      Body mass index is 23.52 kg/(m^2).  PHYSICAL EXAMNIATION:  Gen: NAD, conversant, well nourised, obese, well groomed                     Cardiovascular: Regular rate rhythm, no peripheral edema, warm, nontender. Eyes: Conjunctivae clear without exudates or hemorrhage Neck: Supple, no carotid bruise. Pulmonary: Clear to auscultation bilaterally   NEUROLOGICAL EXAM:  MENTAL STATUS: Speech:    Speech is normal; fluent and spontaneous with normal comprehension.  Cognition:     Orientation to time, place and person     Normal recent and remote memory     Normal Attention span and concentration     Normal Language, naming, repeating,spontaneous speech     Fund of knowledge   CRANIAL NERVES: CN II: Visual fields are full to confrontation. Fundoscopic exam is normal with sharp discs and no vascular changes. Pupils are round equal and briskly reactive to light. CN III, IV, VI: extraocular movement are normal. No ptosis. CN V: Facial sensation is intact to pinprick in all 3 divisions bilaterally. Corneal responses are intact.  CN VII: Face is symmetric with normal eye closure and smile. CN VIII: Hearing is normal to rubbing fingers CN IX, X: Palate elevates symmetrically. Phonation is normal. CN XI: Head turning and shoulder shrug are intact CN XII: Tongue is midline with normal movements and no atrophy.  MOTOR: Mild spastic left hemiparesis, left upper extremity proximal and distal 4/5, mild left hip flexion and left ankle dorsiflexion weakness  REFLEXES: Reflexes are 2+ and symmetric at the biceps, triceps, knees, and ankles. Plantar responses are flexor.  SENSORY: Intact to light touch, pinprick, position sense, and vibration sense are intact in fingers and  toes.  COORDINATION: Rapid alternating movements and fine finger movements are intact. There is no dysmetria on finger-to-nose and  heel-knee-shin.    GAIT/STANCE: Mildly unsteady gait, checking her left foot across the floor  Romberg is absent.   DIAGNOSTIC DATA (LABS, IMAGING, TESTING) - I reviewed patient records, labs, notes, testing and imaging myself where available.   ASSESSMENT AND PLAN  Creola CornKim Strothman is a 55 y.o. female  Chronic migraine headaches  I went over her preventive medications, extended release topiramate 150 mg every day  Cambia, Fioricet, Compazine as needed,  Continue magnesium oxide, riboflavin as preventive medications,  Also discuss with her the choice of separately   Spastic left hemiparesis following right internal carotid artery dissection, right MCA stroke  Electric stimulation guided Botox injection, used a 300 units of Botox A  Left pronator teres 25 units Left flexor carpi ulnaris 12.5 units Left flexor digitorum profoundi 25 units Left flexor carpi radialis 25 units Left flexor hallucis longus 12.5 units  Left pectoralis major 25 units Left teres major 25 units Left levator scapula 25 unit Left iliocostalis 25 units  Left tibialis posterior 25 unitsx2= 50 units Left flexor digitorum longus 25 unitsx2= 50 units  Levert FeinsteinYijun Shannie Kontos, M.D. Ph.D.  [facility] [address], [address] [place], [place] [address] Ph: [phone] Fax: [phone]  CC: Referring Provider

## 2015-07-15 NOTE — Progress Notes (Signed)
**  Botox 100 units x 3 vials, Lot [id number], Exp 12/2017, [facility] [id number], specialty pharmacy**mck,rn.

## 2015-07-23 ENCOUNTER — Encounter: Payer: Self-pay | Admitting: Physician Assistant

## 2015-07-23 ENCOUNTER — Ambulatory Visit: Payer: Self-pay | Admitting: Internal Medicine

## 2015-07-23 ENCOUNTER — Ambulatory Visit (INDEPENDENT_AMBULATORY_CARE_PROVIDER_SITE_OTHER): Payer: BLUE CROSS/BLUE SHIELD | Admitting: Physician Assistant

## 2015-07-23 VITALS — BP 120/66 | HR 82 | Temp 97.7°F | Resp 16 | Ht 68.5 in | Wt 157.6 lb

## 2015-07-23 DIAGNOSIS — Z79899 Other long term (current) drug therapy: Secondary | ICD-10-CM | POA: Diagnosis not present

## 2015-07-23 DIAGNOSIS — E782 Mixed hyperlipidemia: Secondary | ICD-10-CM

## 2015-07-23 DIAGNOSIS — E039 Hypothyroidism, unspecified: Secondary | ICD-10-CM | POA: Diagnosis not present

## 2015-07-23 DIAGNOSIS — I63031 Cerebral infarction due to thrombosis of right carotid artery: Secondary | ICD-10-CM | POA: Diagnosis not present

## 2015-07-23 DIAGNOSIS — R7303 Prediabetes: Secondary | ICD-10-CM

## 2015-07-23 DIAGNOSIS — E559 Vitamin D deficiency, unspecified: Secondary | ICD-10-CM | POA: Diagnosis not present

## 2015-07-23 DIAGNOSIS — R030 Elevated blood-pressure reading, without diagnosis of hypertension: Secondary | ICD-10-CM

## 2015-07-23 LAB — CBC WITH DIFFERENTIAL/PLATELET
Basophils Absolute: 0 cells/uL (ref 0–200)
Basophils Relative: 0 %
Eosinophils Absolute: 102 cells/uL (ref 15–500)
Eosinophils Relative: 2 %
HCT: 43.6 % (ref 35.0–45.0)
Hemoglobin: 14.6 g/dL (ref 11.7–15.5)
Lymphocytes Relative: 40 %
Lymphs Abs: 2040 cells/uL (ref 850–3900)
MCH: 32 pg (ref 27.0–33.0)
MCHC: 33.5 g/dL (ref 32.0–36.0)
MCV: 95.6 fL (ref 80.0–100.0)
MPV: 10.5 fL (ref 7.5–12.5)
Monocytes Absolute: 459 cells/uL (ref 200–950)
Monocytes Relative: 9 %
Neutro Abs: 2499 cells/uL (ref 1500–7800)
Neutrophils Relative %: 49 %
Platelets: 206 10*3/uL (ref 140–400)
RBC: 4.56 MIL/uL (ref 3.80–5.10)
RDW: 13.5 % (ref 11.0–15.0)
WBC: 5.1 10*3/uL (ref 3.8–10.8)

## 2015-07-23 MED ORDER — FLUTICASONE FUROATE-VILANTEROL 100-25 MCG/INH IN AEPB: 1.0000 | INHALATION_SPRAY | Freq: Every day | RESPIRATORY_TRACT | Status: DC

## 2015-07-23 MED ORDER — AMPHETAMINE-DEXTROAMPHETAMINE 10 MG PO TABS: ORAL_TABLET | ORAL | Status: DC

## 2015-07-23 NOTE — Patient Instructions (Addendum)
Please call Dr. Yetta BarreJones for your right forehead Please put hydrocortisone and heating pad on area  Take xanax 3-5 days before big event.   Can do samples steroid inhaler if do not tolerate oral steroids, do 1 puff once a day and wash mouth out afterwards to avoid yeast.

## 2015-07-23 NOTE — Progress Notes (Signed)
Assessment and Plan:  1. Hypertension -Continue medication, monitor blood pressure at home. Continue DASH diet.  Reminder to go to the ER if any CP, SOB, nausea, dizziness, severe HA, changes vision/speech, left arm numbness and tingling and jaw pain.  2. Cholesterol -Continue diet and exercise. Check cholesterol.   3. Prediabetes  -Continue diet and exercise. Check A1C  4. Vitamin D Def - check level and continue medications.   5. Migraine ? Allergy related/TMJ- continue meds PRN, discuss with Dr. Terrace Arabia, take xanx PRN  6. Hypothyroidism- check TSH level, continue medications the same, reminded to take on an empty stomach 30-88mins before food.   7. Asthma-  Will give inhaler. Breo  Continue diet and meds as discussed. Further disposition pending results of labs. Over 30 minutes of exam, counseling, chart review, and critical decision making was performed  HPI 55 y.o. female  presents for 3 month follow up on hypertension, cholesterol, prediabetes, and vitamin D deficiency.   Her blood pressure has been controlled at home, today their BP is BP: 120/66 mmHg  She does workout. She denies chest pain, shortness of breath, dizziness.  She had a Right MCA CVA attributed to a ? Rt Int Carotid Art Dissection suspect for Fibromuscular dysplasia and manifest at that time with a left spastic HP not totally resolved for which she still receives Botox injections by Dr Terrace Arabia preventing flexion contractures of the LUE, had recent injections 09/02/2014.Marland Kitchen  She has high anxiety for social situations and had a lot of anxiety on easter which worsens her migraines.  She is on adderall which helps with concentration, and follows with Dr. Terrace Arabia.   She is on cholesterol medication and denies myalgias. Her cholesterol is at goal. The cholesterol last visit was:   Lab Results  Component Value Date   CHOL 191 04/21/2015   HDL 60 04/21/2015   LDLCALC 93 04/21/2015   TRIG 189* 04/21/2015   CHOLHDL 3.2 04/21/2015     She has been working on diet and exercise for prediabetes, and denies paresthesia of the feet, polydipsia, polyuria and visual disturbances. Last A1C in the office was:  Lab Results  Component Value Date   HGBA1C 5.8* 04/21/2015  Patient is on Vitamin D supplement.   Lab Results  Component Value Date   VD25OH 117* 04/21/2015   She is on thyroid medication. Her medication was not changed last visit.   Lab Results  Component Value Date   TSH 3.035 04/21/2015  .    Current Medications:  Current Outpatient Prescriptions on File Prior to Visit  Medication Sig Dispense Refill  . ALPRAZolam (XANAX) 1 MG tablet Take 1/2 to 1 tablet 3 x day if needed for anxiety 270 tablet 1  . aspirin 325 MG EC tablet Take 325 mg by mouth daily.    . Botulinum Toxin Type A (BOTOX) 200 UNITS SOLR Inject as directed. Change to BOTOX A 100  units    . buPROPion (WELLBUTRIN XL) 300 MG 24 hr tablet TAKE 1 TABLET (300 MG TOTAL) BY MOUTH DAILY. WITH FOOD 90 tablet 3  . butalbital-acetaminophen-caffeine (FIORICET, ESGIC) 50-325-40 MG tablet TAKE 1 TABLET BY MOUTH EVERY 6 HOURS AS NEEDED FOR HEADACHE 30 tablet 5  . CAMBIA 50 MG PACK TAKE 1 PACKET BY MOUTH AS NEEDED. 30 each 11  . cetirizine (ZYRTEC) 10 MG tablet Take 10 mg by mouth daily.    . CRESTOR 40 MG tablet TAKE 1 TABLET BY MOUTH EVERY DAY (Patient taking differently: TAKE 1  TABLET BY MOUTH EVERY OTHER DAY) 30 tablet 2  . ESTRING 2 MG vaginal ring Place 2 mg vaginally every 3 (three) months.     . fenofibrate micronized (LOFIBRA) 134 MG capsule TAKE 1 CAPSULE BY MOUTH EVERY DAY 30 capsule 5  . fluticasone (FLONASE) 50 MCG/ACT nasal spray PLACE 2 SPRAYS INTO BOTH NOSTRILS DAILY. 48 g 1  . HYDROcodone-acetaminophen (NORCO/VICODIN) 5-325 MG tablet Take 1 tablet by mouth every 4 (four) hours as needed for moderate pain. 30 tablet 0  . levothyroxine (SYNTHROID, LEVOTHROID) 100 MCG tablet TAKE 1 TABLET BY MOUTH EVERY DAY 90 tablet 3  . magnesium oxide (MAG-OX)  400 MG tablet Take 400 mg by mouth 2 (two) times daily.     . montelukast (SINGULAIR) 10 MG tablet TAKE 1 TABLET BY MOUTH AT BEDTIME 30 tablet 99  . Multiple Vitamin (MULTIVITAMIN WITH MINERALS) TABS tablet Take 1 tablet by mouth daily.    Marland Kitchen omeprazole (PRILOSEC) 40 MG capsule TAKE 1 CAPSULE (40 MG TOTAL) BY MOUTH DAILY. FOR ACID INDIGESTION AND REFLUX 90 capsule 1  . predniSONE (DELTASONE) 20 MG tablet Take 1 tablet per day as needed for headaches. 90 tablet 1  . prochlorperazine (COMPAZINE) 5 MG tablet TAKE 1 TABLET BY MOUTH 3 TIMES A DAY AS NEEDED FOR NAUSEA 50 tablet 1  . PROVENTIL HFA 108 (90 BASE) MCG/ACT inhaler INHALE 2 PUFFS EVERY 4 HOURS AS NEEDED FOR ASTHMA RESCUE 6.7 Inhaler 0  . Riboflavin 100 MG TABS Take 100 mg by mouth. bid    . Topiramate ER (QUDEXY XR) 150 MG CS24 Take 1 capsule by mouth daily. 30 each 11  . vitamin B-12 (CYANOCOBALAMIN) 1000 MCG tablet Take 1,000 mcg by mouth daily.    . Vitamin D, Ergocalciferol, (DRISDOL) 50000 UNITS CAPS capsule TAKE 1 CAPSULE DAILY 30 capsule 1  . amphetamine-dextroamphetamine (ADDERALL) 10 MG tablet Take 1/2 to 1 tablet 1 or 2 x daily for ADD 60 tablet 0   No current facility-administered medications on file prior to visit.   Medical History:  Past Medical History  Diagnosis Date  . Anxiety   . Spasm of muscle   . Occlusion and stenosis of carotid artery with cerebral infarction   . Headache(784.0)   . Unspecified cerebral artery occlusion with cerebral infarction   . Elevated cholesterol     on Crestor  . Depression   . Hypothyroidism     S/P thyroidectomy  . Asthma   . Chronic heartburn   . Hypertension   . Anemia   . Migraine   . Vitamin D deficiency    Allergies:  Allergies  Allergen Reactions  . Penicillins Anaphylaxis and Hives  . Imitrex [Sumatriptan]   . Other Swelling    kiwi  . Sulfa Antibiotics Hives  . Zomig [Zolmitriptan]      Review of Systems:  Review of Systems  Constitutional: Positive for  malaise/fatigue. Negative for fever, chills, weight loss and diaphoresis.  HENT: Negative for congestion, ear discharge, ear pain, hearing loss, nosebleeds, sore throat and tinnitus.   Eyes: Negative.   Respiratory: Negative for cough, hemoptysis, sputum production, shortness of breath, wheezing and stridor.   Cardiovascular: Negative.  Negative for chest pain and leg swelling.  Gastrointestinal: Negative.  Negative for blood in stool.  Genitourinary: Negative.  Negative for dysuria, urgency and frequency.  Musculoskeletal: Positive for back pain, joint pain and neck pain. Negative for myalgias and falls.  Skin: Negative.   Neurological: Positive for headaches. Negative for dizziness,  tingling, tremors, sensory change, speech change, focal weakness, seizures, loss of consciousness and weakness.  Psychiatric/Behavioral: Positive for depression and memory loss. Negative for suicidal ideas, hallucinations and substance abuse. The patient is nervous/anxious and has insomnia.     Family history- Review and unchanged Social history- Review and unchanged Physical Exam: BP 120/66 mmHg  Pulse 82  Temp(Src) 97.7 F (36.5 C) (Temporal)  Resp 16  Ht 5' 8.5" (1.74 m)  Wt 157 lb 9.6 oz (71.487 kg)  BMI 23.61 kg/m2  SpO2 98% Wt Readings from Last 3 Encounters:  07/23/15 157 lb 9.6 oz (71.487 kg)  07/15/15 157 lb (71.215 kg)  04/21/15 156 lb 3.2 oz (70.852 kg)   General Appearance: Well nourished, in no apparent distress. Eyes: PERRLA, EOMs, conjunctiva no swelling or erythema, normal fundi and vessels. Sinuses: No frontal/maxillary tenderness ENT/Mouth: EACs patent / TMs  nl. Nares clear without erythema, swelling, mucoid exudates. Oral hygiene is good. No erythema, swelling, or exudate. Tongue normal, non-obstructing. Tonsils not swollen or erythematous. Hearing normal. + TMJ tenderness Neck: Supple, thyroid normal. No bruits, nodes or JVD. Respiratory: Respiratory effort normal.  BS equal and  clear bilateral without rales, rhonci, wheezing or stridor. Cardio: Heart sounds are normal with regular rate and rhythm and no murmurs, rubs or gallops. Peripheral pulses are normal and equal bilaterally without edema. No aortic or femoral bruits. Chest: symmetric with normal excursions and percussion. Breasts: Symmetric, without lumps, nipple discharge, retractions, or fibrocystic changes.  Abdomen: Flat, soft, with bowl sounds. Nontender, no guarding, rebound, hernias, masses, or organomegaly.  Lymphatics: Non tender without lymphadenopathy.  Musculoskeletal:  Mild spastic Lt HP and flexion type contracturing of the Lt wrist and hand. Otherwise full ROM of the Rt extremities, with normal joint stability, strength on the Rt  and  gait is normal  Skin: Warm and dry without rashes, lesions, cyanosis, clubbing or  ecchymosis.  Neuro: Cranial nerves intact, reflexes slightly increased on the Left and muscle tone likewise sl. Increased on the Left. No cerebellar symptoms. Sensation intact.  Pysch: Alert and oriented X 3, normal affect.    Quentin MullingAmanda Tinisha Etzkorn, PA-C 10:04 AM [facility]

## 2015-07-24 LAB — HEMOGLOBIN A1C
Hgb A1c MFr Bld: 5.5 % (ref <5.7)
Mean Plasma Glucose: 111 mg/dL

## 2015-07-24 LAB — HEPATIC FUNCTION PANEL
ALT: 29 U/L (ref 6–29)
AST: 18 U/L (ref 10–35)
Albumin: 4.2 g/dL (ref 3.6–5.1)
Alkaline Phosphatase: 48 U/L (ref 33–130)
Bilirubin, Direct: 0.1 mg/dL (ref <=0.2)
Indirect Bilirubin: 0.4 mg/dL (ref 0.2–1.2)
Total Bilirubin: 0.5 mg/dL (ref 0.2–1.2)
Total Protein: 7 g/dL (ref 6.1–8.1)

## 2015-07-24 LAB — LIPID PANEL
Cholesterol: 184 mg/dL (ref 125–200)
HDL: 60 mg/dL (ref >=46)
LDL Cholesterol: 86 mg/dL (ref <130)
Total CHOL/HDL Ratio: 3.1 Ratio (ref <=5.0)
Triglycerides: 189 mg/dL — ABNORMAL HIGH (ref <150)
VLDL: 38 mg/dL — ABNORMAL HIGH (ref <30)

## 2015-07-24 LAB — TSH: TSH: 4.54 mIU/L — ABNORMAL HIGH

## 2015-07-24 LAB — BASIC METABOLIC PANEL WITH GFR
BUN: 18 mg/dL (ref 7–25)
CO2: 22 mmol/L (ref 20–31)
Calcium: 9.7 mg/dL (ref 8.6–10.4)
Chloride: 107 mmol/L (ref 98–110)
Creat: 1.17 mg/dL — ABNORMAL HIGH (ref 0.50–1.05)
GFR, Est African American: 60 mL/min (ref >=60)
GFR, Est Non African American: 52 mL/min — ABNORMAL LOW (ref >=60)
Glucose, Bld: 83 mg/dL (ref 65–99)
Potassium: 3.8 mmol/L (ref 3.5–5.3)
Sodium: 142 mmol/L (ref 135–146)

## 2015-07-24 LAB — MAGNESIUM: Magnesium: 2 mg/dL (ref 1.5–2.5)

## 2015-07-24 LAB — VITAMIN D 25 HYDROXY (VIT D DEFICIENCY, FRACTURES): Vit D, 25-Hydroxy: 138 ng/mL — ABNORMAL HIGH (ref 30–100)

## 2015-07-28 ENCOUNTER — Other Ambulatory Visit: Payer: Self-pay | Admitting: Internal Medicine

## 2015-07-29 ENCOUNTER — Other Ambulatory Visit: Payer: Self-pay | Admitting: Physician Assistant

## 2015-08-18 ENCOUNTER — Telehealth: Payer: Self-pay | Admitting: Neurology

## 2015-08-18 DIAGNOSIS — IMO0002 Reserved for concepts with insufficient information to code with codable children: Secondary | ICD-10-CM

## 2015-08-18 DIAGNOSIS — G43709 Chronic migraine without aura, not intractable, without status migrainosus: Secondary | ICD-10-CM

## 2015-08-18 MED ORDER — HYDROCODONE-ACETAMINOPHEN 5-325 MG PO TABS: 1.0000 | ORAL_TABLET | ORAL | Status: DC | PRN

## 2015-08-18 NOTE — Telephone Encounter (Signed)
Patient requesting refill of HYDROcodone-acetaminophen (NORCO/VICODIN) 5-325 MG tablet. ° ° °

## 2015-08-18 NOTE — Telephone Encounter (Signed)
Rx printed, signed and placed up front for pick up. 

## 2015-09-09 ENCOUNTER — Telehealth: Payer: Self-pay | Admitting: Neurology

## 2015-09-09 NOTE — Telephone Encounter (Signed)
Left message for Ruth Gregory to return my call

## 2015-09-09 NOTE — Telephone Encounter (Signed)
Pt called to confirm next appt. She also said the left hand is very weak- 12,3 digits > 4,5. She said last OV she rec'd botox in wrist. She sts she may not be able to get botox next month if she is not feeling better. Please call, she said it is not an emergency, just when RN has extra time.

## 2015-09-10 NOTE — Telephone Encounter (Signed)
Spoke to Dr. Terrace ArabiaYan - she has a pending appointment on 10/09/15 for her next Botox injection.  Dr. Terrace ArabiaYan stated she could change the injection pattern at her next visit.  Also, if the weakness is not getting better prior to next injection, we can push her appointment out another month.  Returned call to Selena BattenKim - she is agreeable to this plan and will call back if she feels her appointment needs to be changed.

## 2015-09-11 ENCOUNTER — Other Ambulatory Visit: Payer: Self-pay | Admitting: Physician Assistant

## 2015-09-11 MED ORDER — AMPHETAMINE-DEXTROAMPHETAMINE 10 MG PO TABS: ORAL_TABLET | ORAL | Status: DC

## 2015-09-12 ENCOUNTER — Other Ambulatory Visit: Payer: Self-pay | Admitting: *Deleted

## 2015-09-12 MED ORDER — ROSUVASTATIN CALCIUM 40 MG PO TABS: 40.0000 mg | ORAL_TABLET | Freq: Every day | ORAL | Status: DC

## 2015-09-12 MED ORDER — OMEPRAZOLE 40 MG PO CPDR: DELAYED_RELEASE_CAPSULE | ORAL | Status: DC

## 2015-09-12 MED ORDER — BUPROPION HCL ER (XL) 300 MG PO TB24: ORAL_TABLET | ORAL | Status: DC

## 2015-09-12 MED ORDER — FENOFIBRATE MICRONIZED 134 MG PO CAPS: ORAL_CAPSULE | ORAL | Status: DC

## 2015-09-12 MED ORDER — LEVOTHYROXINE SODIUM 100 MCG PO TABS: 100.0000 ug | ORAL_TABLET | Freq: Every day | ORAL | Status: DC

## 2015-09-12 MED ORDER — MONTELUKAST SODIUM 10 MG PO TABS: 10.0000 mg | ORAL_TABLET | Freq: Every day | ORAL | Status: DC

## 2015-09-25 ENCOUNTER — Other Ambulatory Visit: Payer: Self-pay

## 2015-09-25 MED ORDER — TOPIRAMATE ER 150 MG PO SPRINKLE CAP24: 1.0000 | EXTENDED_RELEASE_CAPSULE | Freq: Every day | ORAL | Status: DC

## 2015-09-29 ENCOUNTER — Telehealth: Payer: Self-pay | Admitting: *Deleted

## 2015-09-29 ENCOUNTER — Other Ambulatory Visit: Payer: Self-pay | Admitting: *Deleted

## 2015-09-29 MED ORDER — DICLOFENAC POTASSIUM(MIGRAINE) 50 MG PO PACK: PACK | ORAL | Status: DC

## 2015-09-29 NOTE — Telephone Encounter (Signed)
Left message for a return to discuss medication refill request ([place]).

## 2015-09-29 NOTE — Telephone Encounter (Signed)
She needs a 90-day rx sent to [facility].  Completed.

## 2015-10-06 ENCOUNTER — Telehealth: Payer: Self-pay | Admitting: Neurology

## 2015-10-06 NOTE — Telephone Encounter (Signed)
Patient called, would like to speak to someone regarding BOTOX appointment July 13th. Patient doesn't think she needs right now. Would like to consult with Dr. Terrace ArabiaYan about this. Please call [phone].

## 2015-10-06 NOTE — Telephone Encounter (Signed)
Spoke to patient - she would like to reschedule her Botox to August.  Her new appt is 11/20/15.

## 2015-10-09 ENCOUNTER — Ambulatory Visit: Payer: BLUE CROSS/BLUE SHIELD | Admitting: Neurology

## 2015-10-14 ENCOUNTER — Other Ambulatory Visit: Payer: Self-pay | Admitting: *Deleted

## 2015-10-14 MED ORDER — MONTELUKAST SODIUM 10 MG PO TABS: 10.0000 mg | ORAL_TABLET | Freq: Every day | ORAL | Status: DC

## 2015-10-15 ENCOUNTER — Ambulatory Visit (INDEPENDENT_AMBULATORY_CARE_PROVIDER_SITE_OTHER): Payer: BLUE CROSS/BLUE SHIELD | Admitting: Physician Assistant

## 2015-10-15 ENCOUNTER — Encounter: Payer: Self-pay | Admitting: Physician Assistant

## 2015-10-15 VITALS — HR 98 | Temp 97.5°F | Resp 16 | Ht 68.5 in | Wt 158.8 lb

## 2015-10-15 DIAGNOSIS — R350 Frequency of micturition: Secondary | ICD-10-CM | POA: Diagnosis not present

## 2015-10-15 DIAGNOSIS — E039 Hypothyroidism, unspecified: Secondary | ICD-10-CM | POA: Diagnosis not present

## 2015-10-15 DIAGNOSIS — Z79899 Other long term (current) drug therapy: Secondary | ICD-10-CM

## 2015-10-15 DIAGNOSIS — K5900 Constipation, unspecified: Secondary | ICD-10-CM

## 2015-10-15 LAB — CBC WITH DIFFERENTIAL/PLATELET
Basophils Absolute: 0 cells/uL (ref 0–200)
Basophils Relative: 0 %
Eosinophils Absolute: 0 cells/uL — ABNORMAL LOW (ref 15–500)
Eosinophils Relative: 0 %
HCT: 42.7 % (ref 35.0–45.0)
Hemoglobin: 14.6 g/dL (ref 11.7–15.5)
Lymphocytes Relative: 18 %
Lymphs Abs: 504 cells/uL — ABNORMAL LOW (ref 850–3900)
MCH: 32.2 pg (ref 27.0–33.0)
MCHC: 34.2 g/dL (ref 32.0–36.0)
MCV: 94.1 fL (ref 80.0–100.0)
MPV: 10.7 fL (ref 7.5–12.5)
Monocytes Absolute: 28 cells/uL — ABNORMAL LOW (ref 200–950)
Monocytes Relative: 1 %
Neutro Abs: 2268 cells/uL (ref 1500–7800)
Neutrophils Relative %: 81 %
Platelets: 236 10*3/uL (ref 140–400)
RBC: 4.54 MIL/uL (ref 3.80–5.10)
RDW: 13 % (ref 11.0–15.0)
WBC: 2.8 10*3/uL — ABNORMAL LOW (ref 3.8–10.8)

## 2015-10-15 LAB — BASIC METABOLIC PANEL WITH GFR
BUN: 13 mg/dL (ref 7–25)
CO2: 20 mmol/L (ref 20–31)
Calcium: 9.4 mg/dL (ref 8.6–10.4)
Chloride: 109 mmol/L (ref 98–110)
Creat: 1.17 mg/dL — ABNORMAL HIGH (ref 0.50–1.05)
GFR, Est African American: 60 mL/min (ref >=60)
GFR, Est Non African American: 52 mL/min — ABNORMAL LOW (ref >=60)
Glucose, Bld: 121 mg/dL — ABNORMAL HIGH (ref 65–99)
Potassium: 4.2 mmol/L (ref 3.5–5.3)
Sodium: 139 mmol/L (ref 135–146)

## 2015-10-15 LAB — TSH: TSH: 0.96 mIU/L

## 2015-10-15 LAB — HEPATIC FUNCTION PANEL
ALT: 36 U/L — ABNORMAL HIGH (ref 6–29)
AST: 24 U/L (ref 10–35)
Albumin: 4.6 g/dL (ref 3.6–5.1)
Alkaline Phosphatase: 67 U/L (ref 33–130)
Bilirubin, Direct: 0.1 mg/dL (ref <=0.2)
Indirect Bilirubin: 0.3 mg/dL (ref 0.2–1.2)
Total Bilirubin: 0.4 mg/dL (ref 0.2–1.2)
Total Protein: 7.2 g/dL (ref 6.1–8.1)

## 2015-10-15 LAB — MAGNESIUM: Magnesium: 2.1 mg/dL (ref 1.5–2.5)

## 2015-10-15 MED ORDER — FLUTICASONE FUROATE-VILANTEROL 100-25 MCG/INH IN AEPB: 1.0000 | INHALATION_SPRAY | Freq: Every day | RESPIRATORY_TRACT | Status: DC

## 2015-10-15 NOTE — Patient Instructions (Signed)
About Constipation  Constipation Overview Constipation is the most common gastrointestinal complaint - about 4 million Americans experience constipation and make 2.5 million physician visits a year to get help for the problem.  Constipation can occur when the colon absorbs too much water, the colon's muscle contraction is slow or sluggish, and/or there is delayed transit time through the colon.  The result is stool that is hard and dry.  Indicators of constipation include straining during bowel movements greater than 25% of the time, having fewer than three bowel movements per week, and/or the feeling of incomplete evacuation.  There are established guidelines ([place] II ) for defining constipation. A person needs to have two or more of the following symptoms for at least 12 weeks (not necessarily consecutive) in the preceding 12 months: . Straining in  greater than 25% of bowel movements . Lumpy or hard stools in greater than 25% of bowel movements . Sensation of incomplete emptying in greater than 25% of bowel movements . Sensation of anorectal obstruction/blockade in greater than 25% of bowel movements . Manual maneuvers to help empty greater than 25% of bowel movements (e.g., digital evacuation, support of the pelvic floor)  . Less than  3 bowel movements/week . Loose stools are not present, and criteria for irritable bowel syndrome are insufficient  Common Causes of Constipation . Lack of fiber in your diet . Lack of physical activity . Medications, including iron and calcium supplements  . Dairy intake . Dehydration . Abuse of laxatives  Travel  Irritable Bowel Syndrome  Pregnancy  Luteal phase of menstruation (after ovulation and before menses)  Colorectal problems  Intestinal Dysfunction  Treating Constipation  There are several ways of treating constipation, including changes to diet and exercise, use of laxatives, adjustments to the pelvic floor, and scheduled toileting.   These treatments include: . increasing fiber and fluids in the diet  . increasing physical activity . learning muscle coordination   learning proper toileting techniques and toileting modifications   designing and sticking  to a toileting schedule     2007, [employer] Doc.22 

## 2015-10-15 NOTE — Progress Notes (Signed)
Subjective:    Patient ID: Ruth Gregory, female    DOB: 08/08/1960, 55 y.o.   MRN: [id number]  HPI 55 y.o. WF with history of CVA with subsequent spastic hemiplegia left side, chol, HTN, preDM presents with stomach pain x 3-4 days. She states for July 12-15th she started to have lower AB pain, soreness, hard to walk, she had constipation x 3-4 days, was passed gas, has some chills but no fever. She has had urinary frequency during the time as well. She took adderall, drank coffee/water, and then had diarrhea and this helped her feel better. States has history of IBS with increase nervousness. Prior to this episode she had migraine and has taken hydrocodone and fiorcet.   She is tearful, her dog died on the 5th, and she states this was her best friend. Admits that she is very depressed about this.   She has history of bilateral knee pain she relates to the crestor. Normal colonoscopy at age 55, 7 years ago.   Pulse 98, temperature 97.5 F (36.4 C), temperature source Temporal, resp. rate 16, height 5' 8.5" (1.74 m), weight 158 lb 12.8 oz (72.031 kg), SpO2 97 %.  Current Outpatient Prescriptions on File Prior to Visit  Medication Sig Dispense Refill  . aspirin 325 MG EC tablet Take 325 mg by mouth daily.    . Botulinum Toxin Type A (BOTOX) 200 UNITS SOLR Inject as directed. Change to BOTOX A 100  units    . buPROPion (WELLBUTRIN XL) 300 MG 24 hr tablet TAKE 1 TABLET (300 MG TOTAL) BY MOUTH DAILY. WITH FOOD 90 tablet 3  . butalbital-acetaminophen-caffeine (FIORICET, ESGIC) 50-325-40 MG tablet TAKE 1 TABLET BY MOUTH EVERY 6 HOURS AS NEEDED FOR HEADACHE 30 tablet 5  . cetirizine (ZYRTEC) 10 MG tablet Take 10 mg by mouth daily.    . Diclofenac Potassium (CAMBIA) 50 MG PACK Take one packet at onset of migraine.  May repeat once in two hours, if needed.  Max dose 2 packets per day.  #27/90days 27 each 3  . ESTRING 2 MG vaginal ring Place 2 mg vaginally every 3 (three) months.     . fenofibrate  micronized (LOFIBRA) 134 MG capsule TAKE 1 CAPSULE BY MOUTH EVERY DAY 90 capsule 3  . fluticasone (FLONASE) 50 MCG/ACT nasal spray PLACE 2 SPRAYS INTO BOTH NOSTRILS DAILY. 48 g 1  . fluticasone furoate-vilanterol (BREO ELLIPTA) 100-25 MCG/INH AEPB Inhale 1 puff into the lungs daily. Rinse mouth with water after each use 1 each 3  . HYDROcodone-acetaminophen (NORCO/VICODIN) 5-325 MG tablet Take 1 tablet by mouth every 4 (four) hours as needed for moderate pain. 30 tablet 0  . levothyroxine (SYNTHROID, LEVOTHROID) 100 MCG tablet Take 1 tablet (100 mcg total) by mouth daily. 90 tablet 3  . magnesium oxide (MAG-OX) 400 MG tablet Take 400 mg by mouth 2 (two) times daily.     . montelukast (SINGULAIR) 10 MG tablet Take 1 tablet (10 mg total) by mouth at bedtime. 90 tablet 3  . Multiple Vitamin (MULTIVITAMIN WITH MINERALS) TABS tablet Take 1 tablet by mouth daily.    Marland Kitchen. omeprazole (PRILOSEC) 40 MG capsule TAKE 1 CAPSULE (40 MG TOTAL) BY MOUTH DAILY. FOR ACID INDIGESTION AND REFLUX 90 capsule 3  . predniSONE (DELTASONE) 20 MG tablet Take 1 tablet per day as needed for headaches. 90 tablet 1  . prochlorperazine (COMPAZINE) 5 MG tablet TAKE 1 TABLET BY MOUTH 3 TIMES A DAY AS NEEDED FOR NAUSEA 50 tablet 1  .  PROVENTIL HFA 108 (90 BASE) MCG/ACT inhaler INHALE 2 PUFFS EVERY 4 HOURS AS NEEDED FOR ASTHMA RESCUE 6.7 Inhaler 0  . Riboflavin 100 MG TABS Take 100 mg by mouth. bid    . rosuvastatin (CRESTOR) 40 MG tablet Take 1 tablet (40 mg total) by mouth daily. 90 tablet 3  . Topiramate ER (QUDEXY XR) 150 MG CS24 Take 1 capsule by mouth daily. 90 each 1  . vitamin B-12 (CYANOCOBALAMIN) 1000 MCG tablet Take 1,000 mcg by mouth daily.    . Vitamin D, Ergocalciferol, (DRISDOL) 50000 UNITS CAPS capsule TAKE 1 CAPSULE DAILY 30 capsule 1  . amphetamine-dextroamphetamine (ADDERALL) 10 MG tablet Take 1/2 to 1 tablet 1 or 2 x daily for ADD 60 tablet 0   No current facility-administered medications on file prior to visit.    Past Medical History  Diagnosis Date  . Anxiety   . Spasm of muscle   . Occlusion and stenosis of carotid artery with cerebral infarction   . Headache(784.0)   . Unspecified cerebral artery occlusion with cerebral infarction   . Elevated cholesterol     on Crestor  . Depression   . Hypothyroidism     S/P thyroidectomy  . Asthma   . Chronic heartburn   . Hypertension   . Anemia   . Migraine   . Vitamin D deficiency    Review of Systems  Constitutional: Negative for fever, chills and diaphoresis.  HENT: Negative for congestion, ear discharge, ear pain, hearing loss, nosebleeds, sore throat and tinnitus.   Eyes: Negative.   Respiratory: Negative for cough, shortness of breath, wheezing and stridor.   Cardiovascular: Negative.  Negative for chest pain and leg swelling.  Gastrointestinal: Positive for abdominal pain, diarrhea and constipation. Negative for nausea, vomiting, blood in stool, abdominal distention, anal bleeding and rectal pain.  Genitourinary: Positive for frequency. Negative for dysuria and urgency.  Musculoskeletal: Positive for back pain and neck pain. Negative for myalgias.  Skin: Negative.   Neurological: Positive for headaches. Negative for dizziness, tremors, seizures and weakness.  Psychiatric/Behavioral: Negative for suicidal ideas and hallucinations. The patient is nervous/anxious.        Objective:   Physical Exam  Constitutional: She appears well-developed and well-nourished.  HENT:  Head: Normocephalic and atraumatic.  Eyes: Conjunctivae are normal. Pupils are equal, round, and reactive to light.  Neck: Normal range of motion. Neck supple.  Cardiovascular: Normal rate and regular rhythm.   Pulmonary/Chest: Effort normal and breath sounds normal.  Abdominal: Soft. Bowel sounds are normal. She exhibits no distension and no mass. There is tenderness (very mild RLQ tenderness, no peritoneal signs). There is no rebound and no guarding.        Assessment & Plan:  Constipation with AB cramping Normal AB exam, no peritoneal signs Likely due to opioid use, increase water, fiber, check labs.  The patient was advised to call immediately if she has any concerning symptoms in the interval. All questions were answered. The patient knows to call the clinic with any problems, questions or concerns or go to the ER if any further progression of symptoms.   Urinary frequency Check urine

## 2015-10-16 ENCOUNTER — Encounter: Payer: Self-pay | Admitting: Physician Assistant

## 2015-10-16 LAB — URINALYSIS, ROUTINE W REFLEX MICROSCOPIC
Bilirubin Urine: NEGATIVE
Glucose, UA: NEGATIVE
Hgb urine dipstick: NEGATIVE
Ketones, ur: NEGATIVE
Leukocytes, UA: NEGATIVE
Nitrite: NEGATIVE
Protein, ur: NEGATIVE
Specific Gravity, Urine: 1.011 (ref 1.001–1.035)
pH: 7.5 (ref 5.0–8.0)

## 2015-10-17 DIAGNOSIS — D485 Neoplasm of uncertain behavior of skin: Secondary | ICD-10-CM | POA: Diagnosis not present

## 2015-10-17 LAB — URINE CULTURE: Organism ID, Bacteria: NO GROWTH

## 2015-10-21 ENCOUNTER — Encounter: Payer: Self-pay | Admitting: Physician Assistant

## 2015-10-23 DIAGNOSIS — N816 Rectocele: Secondary | ICD-10-CM | POA: Diagnosis not present

## 2015-10-23 DIAGNOSIS — N952 Postmenopausal atrophic vaginitis: Secondary | ICD-10-CM | POA: Diagnosis not present

## 2015-10-23 DIAGNOSIS — N8111 Cystocele, midline: Secondary | ICD-10-CM | POA: Diagnosis not present

## 2015-10-24 ENCOUNTER — Ambulatory Visit (INDEPENDENT_AMBULATORY_CARE_PROVIDER_SITE_OTHER): Payer: BLUE CROSS/BLUE SHIELD | Admitting: Internal Medicine

## 2015-10-24 ENCOUNTER — Encounter: Payer: Self-pay | Admitting: Internal Medicine

## 2015-10-24 VITALS — BP 122/78 | HR 88 | Temp 97.7°F | Resp 16 | Ht 68.5 in | Wt 158.2 lb

## 2015-10-24 DIAGNOSIS — K219 Gastro-esophageal reflux disease without esophagitis: Secondary | ICD-10-CM

## 2015-10-24 DIAGNOSIS — E782 Mixed hyperlipidemia: Secondary | ICD-10-CM

## 2015-10-24 DIAGNOSIS — E559 Vitamin D deficiency, unspecified: Secondary | ICD-10-CM | POA: Diagnosis not present

## 2015-10-24 DIAGNOSIS — Z79899 Other long term (current) drug therapy: Secondary | ICD-10-CM

## 2015-10-24 DIAGNOSIS — R7303 Prediabetes: Secondary | ICD-10-CM | POA: Diagnosis not present

## 2015-10-24 DIAGNOSIS — R030 Elevated blood-pressure reading, without diagnosis of hypertension: Secondary | ICD-10-CM | POA: Diagnosis not present

## 2015-10-24 DIAGNOSIS — E890 Postprocedural hypothyroidism: Secondary | ICD-10-CM

## 2015-10-24 DIAGNOSIS — F411 Generalized anxiety disorder: Secondary | ICD-10-CM | POA: Diagnosis not present

## 2015-10-24 LAB — CBC WITH DIFFERENTIAL/PLATELET
Basophils Absolute: 0 cells/uL (ref 0–200)
Basophils Relative: 0 %
Eosinophils Absolute: 141 cells/uL (ref 15–500)
Eosinophils Relative: 3 %
HCT: 42.3 % (ref 35.0–45.0)
Hemoglobin: 14.3 g/dL (ref 11.7–15.5)
Lymphocytes Relative: 33 %
Lymphs Abs: 1551 cells/uL (ref 850–3900)
MCH: 32 pg (ref 27.0–33.0)
MCHC: 33.8 g/dL (ref 32.0–36.0)
MCV: 94.6 fL (ref 80.0–100.0)
MPV: 10.4 fL (ref 7.5–12.5)
Monocytes Absolute: 423 cells/uL (ref 200–950)
Monocytes Relative: 9 %
Neutro Abs: 2585 cells/uL (ref 1500–7800)
Neutrophils Relative %: 55 %
Platelets: 221 10*3/uL (ref 140–400)
RBC: 4.47 MIL/uL (ref 3.80–5.10)
RDW: 12.8 % (ref 11.0–15.0)
WBC: 4.7 10*3/uL (ref 3.8–10.8)

## 2015-10-24 LAB — HEMOGLOBIN A1C
Hgb A1c MFr Bld: 5.6 % (ref <5.7)
Mean Plasma Glucose: 114 mg/dL

## 2015-10-24 LAB — LIPID PANEL
Cholesterol: 208 mg/dL — ABNORMAL HIGH (ref 125–200)
HDL: 57 mg/dL (ref >=46)
LDL Cholesterol: 114 mg/dL (ref <130)
Total CHOL/HDL Ratio: 3.6 Ratio (ref <=5.0)
Triglycerides: 186 mg/dL — ABNORMAL HIGH (ref <150)
VLDL: 37 mg/dL — ABNORMAL HIGH (ref <30)

## 2015-10-24 MED ORDER — RANITIDINE HCL 300 MG PO TABS: ORAL_TABLET | ORAL | 1 refills | Status: DC

## 2015-10-24 NOTE — Patient Instructions (Addendum)
GETTING OFF OF PPI's    Nexium/protonix/prilosec/Omeprazole/Dexilant/Aciphex are called PPI's, they are great at healing your stomach but should only be taken for a short period of time.     Recent studies have shown that taken for a long time they  can increase the risk of osteoporosis (weakening of your bones), pneumonia, low magnesium, restless legs, Cdiff (infection that causes diarrhea), DEMENTIA and most recently kidney damage / disease / insufficiency.     Due to this information we want to try to stop the PPI but if you try to stop it abruptly this can cause rebound acid and worsening symptoms.   So this is how we want you to get off the PPI:  - Start taking the nexium/protonix/prilosec/PPI  every other day with  zantac (ranitidine) 2 x a day for 2-4 weeks - some people stay on this dosage and can not taper off further. Our main goal is to limit the dosage and amount you are taking so if you need to stay on this dose.   - then decrease the PPI to every 3 days while taking the zantac (ranitidine) 300mg  twice a day the other  days for 2-4  Weeks  - then you can try the zantac (ranitidine) 300mg  once at night or up to 2 x day as needed.  - you can continue on this once at night or stop all together  - Avoid alcohol, spicy foods, NSAIDS (aleve, ibuprofen) at this time. See foods below.   +++++++++++++++++++++++++++++++++++++++++++  Food Choices for Gastroesophageal Reflux Disease  When you have gastroesophageal reflux disease (GERD), the foods you eat and your eating habits are very important. Choosing the right foods can help ease the discomfort of GERD. WHAT GENERAL GUIDELINES DO I NEED TO FOLLOW?  Choose fruits, vegetables, whole grains, low-fat dairy products, and low-fat meat, fish, and poultry.  Limit fats such as oils, salad dressings, butter, nuts, and avocado.  Keep a food diary to identify foods that cause symptoms.  Avoid foods that cause reflux. These may be  different for different people.  Eat frequent small meals instead of three large meals each day.  Eat your meals slowly, in a relaxed setting.  Limit fried foods.  Cook foods using methods other than frying.  Avoid drinking alcohol.  Avoid drinking large amounts of liquids with your meals.  Avoid bending over or lying down until 2-3 hours after eating.   WHAT FOODS ARE NOT RECOMMENDED? The following are some foods and drinks that may worsen your symptoms:  Vegetables Tomatoes. Tomato juice. Tomato and spaghetti sauce. Chili peppers. Onion and garlic. Horseradish. Fruits Oranges, grapefruit, and lemon (fruit and juice). Meats High-fat meats, fish, and poultry. This includes hot dogs, ribs, ham, sausage, salami, and bacon. Dairy Whole milk and chocolate milk. Sour cream. Cream. Butter. Ice cream. Cream cheese.  Beverages Coffee and tea, with or without caffeine. Carbonated beverages or energy drinks. Condiments Hot sauce. Barbecue sauce.  Sweets/Desserts Chocolate and cocoa. Donuts. Peppermint and spearmint. Fats and Oils High-fat foods, including [place] fries and potato chips. Other Vinegar. Strong spices, such as black pepper, white pepper, red pepper, cayenne, curry powder, cloves, ginger, and chili powder. Nexium/protonix/prilosec are called PPI's, they are great at healing your stomach but should only be taken for a short period of time.    ++++++++++++++++++++++++++++++++++++++++++++++++++++ Recommend Adult Low Dose Aspirin or  coated  Aspirin 81 mg daily  To reduce risk of Colon Cancer 20 %,  Skin Cancer 26 % ,  Melanoma 46%  and  Pancreatic cancer 60% ++++++++++++++++++++++++++++++++++++++++++++++++++++++ Vitamin D goal  is between 70-100.  Please make sure that you are taking your Vitamin D as directed.  It is very important as a natural anti-inflammatory  helping hair, skin, and nails, as well as reducing stroke and heart attack risk.  It helps your  bones and helps with mood. It also decreases numerous cancer risks so please take it as directed.  Low Vit D is associated with a 200-300% higher risk for CANCER  and 200-300% higher risk for HEART   ATTACK  &  STROKE.   .....................................Marland Kitchen It is also associated with higher death rate at younger ages,  autoimmune diseases like Rheumatoid arthritis, Lupus, Multiple Sclerosis.    Also many other serious conditions, like depression, Alzheimer's Dementia, infertility, muscle aches, fatigue, fibromyalgia - just to name a few. ++++++++++++++++++++++++++++++++++++++++++++++++ Recommend the book "The END of DIETING" by Dr Monico Hoar  & the book "The END of DIABETES " by Dr Monico Hoar At [email or web address] - get book & Audio CD's    Being diabetic has a  300% increased risk for heart attack, stroke, cancer, and alzheimer- type vascular dementia. It is very important that you work harder with diet by avoiding all foods that are white. Avoid white rice (brown & wild rice is OK), white potatoes (sweetpotatoes in moderation is OK), White bread or wheat bread or anything made out of white flour like bagels, donuts, rolls, buns, biscuits, cakes, pastries, cookies, pizza crust, and pasta (made from white flour & egg whites) - vegetarian pasta or spinach or wheat pasta is OK. Multigrain breads like Arnold's or Pepperidge Farm, or multigrain sandwich thins or flatbreads.  Diet, exercise and weight loss can reverse and cure diabetes in the early stages.  Diet, exercise and weight loss is very important in the control and prevention of complications of diabetes which affects every system in your body, ie. Brain - dementia/stroke, eyes - glaucoma/blindness, heart - heart attack/heart failure, kidneys - dialysis, stomach - gastric paralysis, intestines - malabsorption, nerves - severe painful neuritis, circulation - gangrene & loss of a leg(s), and finally cancer and Alzheimers.    I recommend avoid fried &  greasy foods,  sweets/candy, white rice (brown or wild rice or Quinoa is OK), white potatoes (sweet potatoes are OK) - anything made from white flour - bagels, doughnuts, rolls, buns, biscuits,white and wheat breads, pizza crust and traditional pasta made of white flour & egg white(vegetarian pasta or spinach or wheat pasta is OK).  Multi-grain bread is OK - like multi-grain flat bread or sandwich thins. Avoid alcohol in excess. Exercise is also important.    Eat all the vegetables you want - avoid meat, especially red meat and dairy - especially cheese.  Cheese is the most concentrated form of trans-fats which is the worst thing to clog up our arteries. Veggie cheese is OK which can be found in the fresh produce section at [employer] or [employer] or [employer]  ++++++++++++++++++++++++++++++++++++++++++++++++++ DASH Eating Plan  DASH stands for "Dietary Approaches to Stop Hypertension."   The DASH eating plan is a healthy eating plan that has been shown to reduce high blood pressure (hypertension). Additional health benefits may include reducing the risk of type 2 diabetes mellitus, heart disease, and stroke. The DASH eating plan may also help with weight loss. WHAT DO I NEED TO KNOW ABOUT THE DASH EATING PLAN? For the DASH eating plan, you will follow these general guidelines:  Choose  foods with a percent daily value for sodium of less than 5% (as listed on the food label).  Use salt-free seasonings or herbs instead of table salt or sea salt.  Check with your health care provider or pharmacist before using salt substitutes.  Eat lower-sodium products, often labeled as "lower sodium" or "no salt added."  Eat fresh foods.  Eat more vegetables, fruits, and low-fat dairy products.  Choose whole grains. Look for the word "whole" as the first word in the ingredient list.  Choose fish   Limit sweets, desserts, sugars, and sugary drinks.  Choose heart-healthy fats.  Eat veggie cheese    Eat more home-cooked food and less restaurant, buffet, and fast food.  Limit fried foods.  Cook foods using methods other than frying.  Limit canned vegetables. If you do use them, rinse them well to decrease the sodium.  When eating at a restaurant, ask that your food be prepared with less salt, or no salt if possible.                      WHAT FOODS CAN I EAT? Read Dr Francis Dowse Fuhrman's books on The End of Dieting & The End of Diabetes  Grains Whole grain or whole wheat bread. Brown rice. Whole grain or whole wheat pasta. Quinoa, bulgur, and whole grain cereals. Low-sodium cereals. Corn or whole wheat flour tortillas. Whole grain cornbread. Whole grain crackers. Low-sodium crackers.  Vegetables Fresh or frozen vegetables (raw, steamed, roasted, or grilled). Low-sodium or reduced-sodium tomato and vegetable juices. Low-sodium or reduced-sodium tomato sauce and paste. Low-sodium or reduced-sodium canned vegetables.   Fruits All fresh, canned (in natural juice), or frozen fruits.  Protein Products  All fish and seafood.  Dried beans, peas, or lentils. Unsalted nuts and seeds. Unsalted canned beans.  Dairy Low-fat dairy products, such as skim or 1% milk, 2% or reduced-fat cheeses, low-fat ricotta or cottage cheese, or plain low-fat yogurt. Low-sodium or reduced-sodium cheeses.  Fats and Oils Tub margarines without trans fats. Light or reduced-fat mayonnaise and salad dressings (reduced sodium). Avocado. Safflower, olive, or canola oils. Natural peanut or almond butter.  Other Unsalted popcorn and pretzels. The items listed above may not be a complete list of recommended foods or beverages. Contact your dietitian for more options.  +++++++++++++++++++++++++++++++++++++++++++  WHAT FOODS ARE NOT RECOMMENDED? Grains/ White flour or wheat flour White bread. White pasta. White rice. Refined cornbread. Bagels and croissants. Crackers that contain trans fat.  Vegetables  Creamed or  fried vegetables. Vegetables in a . Regular canned vegetables. Regular canned tomato sauce and paste. Regular tomato and vegetable juices.  Fruits Dried fruits. Canned fruit in light or heavy syrup. Fruit juice.  Meat and Other Protein Products Meat in general - RED meat & White meat.  Fatty cuts of meat. Ribs, chicken wings, all processed meats as bacon, sausage, bologna, salami, fatback, hot dogs, bratwurst and packaged luncheon meats.  Dairy Whole or 2% milk, cream, half-and-half, and cream cheese. Whole-fat or sweetened yogurt. Full-fat cheeses or blue cheese. Non-dairy creamers and whipped toppings. Processed cheese, cheese spreads, or cheese curds.  Condiments Onion and garlic salt, seasoned salt, table salt, and sea salt. Canned and packaged gravies. Worcestershire sauce. Tartar sauce. Barbecue sauce. Teriyaki sauce. Soy sauce, including reduced sodium. Steak sauce. Fish sauce. Oyster sauce. Cocktail sauce. Horseradish. Ketchup and mustard. Meat flavorings and tenderizers. Bouillon cubes. Hot sauce. Tabasco sauce. Marinades. Taco seasonings. Relishes.  Fats and Oils Butter, stick margarine, lard, shortening  and bacon fat. Coconut, palm kernel, or palm oils. Regular salad dressings.  Pickles and olives. Salted popcorn and pretzels.  The items listed above may not be a complete list of foods and beverages to avoid.

## 2015-10-25 ENCOUNTER — Encounter: Payer: Self-pay | Admitting: Internal Medicine

## 2015-10-25 LAB — INSULIN, RANDOM: Insulin: 14.2 u[IU]/mL (ref 2.0–19.6)

## 2015-10-25 LAB — VITAMIN D 25 HYDROXY (VIT D DEFICIENCY, FRACTURES): Vit D, 25-Hydroxy: 96 ng/mL (ref 30–100)

## 2015-10-25 NOTE — Progress Notes (Signed)
[facility]                       Ruth Gregory, M.D.        Dyanne Carrel. Steffanie Dunn, P.A.-C       Terri Piedra, P.A.-C   [facility]                [address]                [place], [place]. [address] Telephone [phone] Telefax [phone] ______________________________________________________________________     This very nice 55 y.o. MWF presents for 6 month follow up with Hypertension, Hyperlipidemia, Pre-Diabetes and Vitamin D Deficiency.      Patient has hx/o labile HTN and is monitored expectantly & BP has been controlled at home. Today's BP: 122/78. Patient has had no complaints of any cardiac type chest pain, palpitations, dyspnea/orthopnea/PND, dizziness, claudication, or dependent edema.Patient does have hx/o of a R MCA CVA in 2009 suspected due to dissection of fibromuscular dysplasiaand does have residua of a mild spastic L HP and is still followed by Neurology for Botox injections in the LUE. She also has residual Cognitive dysfunction consequent from the CVA. Other problems include chronic recurrent sinusitis and Migraine HA.      Hyperlipidemia is not controlled with diet. Last Lipids were not at goal: Lab Results  Component Value Date   CHOL 208 (H) 10/24/2015   HDL 57 10/24/2015   LDLCALC 114 10/24/2015   TRIG 186 (H) 10/24/2015   CHOLHDL 3.6 10/24/2015      Also, the patient is monitored expectantly for PreDiabetes and has had no symptoms of reactive hypoglycemia, diabetic polys, paresthesias or visual blurring.  Last A1c was at goal: Lab Results  Component Value Date   HGBA1C 5.6 10/24/2015      Patient has been on supressive thyroid replacement since excision of a thyroid nodule in 2007. Further, the patient also has history of Vitamin D Deficiency of "23" in 2008 and supplements vitamin D without any suspected side-effects. Last vitamin D was   Lab Results  Component Value Date   VD25OH 96 10/24/2015   Current Outpatient Prescriptions on File Prior to Visit  Medication Sig  . aspirin 325 MG EC tablet Take 325 mg by mouth daily.  . Botulinum Toxin Type A (BOTOX) 200 UNITS SOLR Inject as directed. Change to BOTOX A 100  units  . buPROPion (WELLBUTRIN XL) 300 MG 24 hr tablet TAKE 1 TABLET (300 MG TOTAL) BY MOUTH DAILY. WITH FOOD  . butalbital-acetaminophen-caffeine (FIORICET, ESGIC) 50-325-40 MG tablet TAKE 1 TABLET BY MOUTH EVERY 6 HOURS AS NEEDED FOR HEADACHE  . cetirizine (ZYRTEC) 10 MG tablet Take 10 mg by mouth daily.  . Diclofenac Potassium (CAMBIA) 50 MG PACK Take one packet at onset of migraine.  May repeat once in two hours, if needed.  Max dose 2 packets per day.  #27/90days  . ESTRING 2 MG vaginal ring Place 2 mg vaginally every 3 (three) months.   . fenofibrate micronized (LOFIBRA) 134 MG capsule TAKE 1 CAPSULE BY MOUTH EVERY DAY  . fluticasone (FLONASE) 50 MCG/ACT nasal spray PLACE 2 SPRAYS INTO BOTH NOSTRILS DAILY.  . fluticasone furoate-vilanterol (BREO ELLIPTA) 100-25 MCG/INH AEPB Inhale 1 puff into the lungs daily. Rinse mouth with water after each use  . HYDROcodone-acetaminophen (NORCO/VICODIN) 5-325 MG tablet Take 1 tablet by mouth every 4 (four) hours as needed for moderate pain.  Marland Kitchen  levothyroxine (SYNTHROID, LEVOTHROID) 100 MCG tablet Take 1 tablet (100 mcg total) by mouth daily.  . magnesium oxide (MAG-OX) 400 MG tablet Take 400 mg by mouth 2 (two) times daily.   . montelukast (SINGULAIR) 10 MG tablet Take 1 tablet (10 mg total) by mouth at bedtime.  . Multiple Vitamin (MULTIVITAMIN WITH MINERALS) TABS tablet Take 1 tablet by mouth daily.  Marland Kitchen omeprazole (PRILOSEC) 40 MG capsule TAKE 1 CAPSULE (40 MG TOTAL) BY MOUTH DAILY. FOR ACID INDIGESTION AND REFLUX  . predniSONE (DELTASONE) 20 MG tablet Take 1 tablet per day as needed for headaches.  . prochlorperazine (COMPAZINE) 5 MG tablet TAKE 1 TABLET BY MOUTH 3 TIMES A DAY AS NEEDED FOR NAUSEA  . PROVENTIL  HFA 108 (90 BASE) MCG/ACT inhaler INHALE 2 PUFFS EVERY 4 HOURS AS NEEDED FOR ASTHMA RESCUE  . Riboflavin 100 MG TABS Take 100 mg by mouth. bid  . rosuvastatin (CRESTOR) 40 MG tablet Take 1 tablet (40 mg total) by mouth daily.  . Topiramate ER (QUDEXY XR) 150 MG CS24 Take 1 capsule by mouth daily.  . vitamin B-12 (CYANOCOBALAMIN) 1000 MCG tablet Take 1,000 mcg by mouth daily.  . Vitamin D, Ergocalciferol, (DRISDOL) 50000 UNITS CAPS capsule TAKE 1 CAPSULE DAILY  . amphetamine-dextroamphetamine (ADDERALL) 10 MG tablet Take 1/2 to 1 tablet 1 or 2 x daily for ADD   No current facility-administered medications on file prior to visit.    Allergies  Allergen Reactions  . Penicillins Anaphylaxis and Hives  . Imitrex [Sumatriptan]   . Other Swelling    kiwi  . Sulfa Antibiotics Hives  . Zomig [Zolmitriptan]    PMHx:   Past Medical History:  Diagnosis Date  . Anemia   . Anxiety   . Asthma   . Chronic heartburn   . Depression   . Elevated cholesterol    on Crestor  . Headache(784.0)   . Hypertension   . Hypothyroidism    S/P thyroidectomy  . Migraine   . Occlusion and stenosis of carotid artery with cerebral infarction   . Spasm of muscle   . Unspecified cerebral artery occlusion with cerebral infarction   . Vitamin D deficiency    Immunization History  Administered Date(s) Administered  . Influenza Split 01/24/2014  . Influenza-Unspecified 11/28/2011  . PPD Test 04/04/2013, 04/22/2015  . Pneumococcal Polysaccharide-23 03/29/1998  . Td 03/29/2002   Past Surgical History:  Procedure Laterality Date  . COSMETIC SURGERY    . CYSTOCELE REPAIR N/A 01/08/2013   Procedure: ANTERIOR REPAIR (CYSTOCELE);  Surgeon: Meriel Pica, MD;  Location: [facility];  Service: Gynecology;  Laterality: N/A;  . NASAL SINUS SURGERY    . VAGINAL HYSTERECTOMY     FHx:    Reviewed / unchanged  SHx:    Reviewed / unchanged  Systems Review:  Constitutional: Denies fever, chills, wt changes,  headaches, insomnia, fatigue, night sweats, change in appetite. Eyes: Denies redness, blurred vision, diplopia, discharge, itchy, watery eyes.  ENT: Denies discharge, congestion, post nasal drip, epistaxis, sore throat, earache, hearing loss, dental pain, tinnitus, vertigo, sinus pain, snoring.  CV: Denies chest pain, palpitations, irregular heartbeat, syncope, dyspnea, diaphoresis, orthopnea, PND, claudication or edema. Respiratory: denies cough, dyspnea, DOE, pleurisy, hoarseness, laryngitis, wheezing.  Gastrointestinal: Denies dysphagia, odynophagia, heartburn, reflux, water brash, abdominal pain or cramps, nausea, vomiting, bloating, diarrhea, constipation, hematemesis, melena, hematochezia  or hemorrhoids. Genitourinary: Denies dysuria, frequency, urgency, nocturia, hesitancy, discharge, hematuria or flank pain. Musculoskeletal: Denies arthralgias, myalgias, stiffness, jt. swelling, pain, limping  or strain/sprain.  Skin: Denies pruritus, rash, hives, warts, acne, eczema or change in skin lesion(s). Neuro: No weakness, tremor, incoordination, spasms, paresthesia or pain. Psychiatric: Denies confusion, memory loss or sensory loss. Endo: Denies change in weight, skin or hair change.  Heme/Lymph: No excessive bleeding, bruising or enlarged lymph nodes.  Physical Exam  BP 122/78   Pulse 88   Temp 97.7 F (36.5 C)   Resp 16   Ht 5' 8.5" (1.74 m)   Wt 158 lb 3.2 oz (71.8 kg)   BMI 23.70 kg/m   Appears well nourished and in no distress. Eyes: PERRLA, EOMs, conjunctiva no swelling or erythema. Sinuses: No frontal/maxillary tenderness ENT/Mouth: EAC's clear, TM's nl w/o erythema, bulging. Nares clear w/o erythema, swelling, exudates. Oropharynx clear without erythema or exudates. Oral hygiene is good. Tongue normal, non obstructing. Hearing intact.  Neck: Supple. Thyroid nl. Car 2+/2+ without bruits, nodes or JVD. Chest: Respirations nl with BS clear & equal w/o rales, rhonchi, wheezing or  stridor.  Cor: Heart sounds normal w/ regular rate and rhythm without sig. murmurs, gallops, clicks, or rubs. Peripheral pulses normal and equal  without edema.  Abdomen: Soft & bowel sounds normal. Non-tender w/o guarding, rebound, hernias, masses, or organomegaly.  Lymphatics: Unremarkable.  Musculoskeletal: Full ROM all peripheral extremities, joint stability, 5/5 strength, and normal gait.  Skin: Warm, dry without exposed rashes, lesions or ecchymosis apparent.  Neuro: Cranial nerves intact, reflexes equal bilaterally. Sensory-motor testing grossly intact. Tendon reflexes grossly intact.  Pysch: Alert & oriented x 3.  Insight and judgement nl & appropriate. No ideations.  Assessment and Plan:   1. Elevated blood pressure reading without diagnosis of hypertension  - Continue medication, monitor blood pressure at home. Continue DASH diet. Reminder to go to the ER if any CP, SOB, nausea, dizziness, severe HA, changes vision/speech, left arm numbness and tingling and jaw pain.  2. Mixed hyperlipidemia  - Continue diet/meds, exercise,& lifestyle modifications. Continue monitor periodic cholesterol/liver & renal functions  - Lipid panel  3. Prediabetes  - Continue diet, exercise, lifestyle modifications. Monitor appropriate labs. - Hemoglobin A1c - Insulin, random  4. Vitamin D deficiency  - Continue supplementation. - VITAMIN D 25 Hydroxy   5. Postoperative hypothyroidism   6. Medication management  - CBC with Differential/Platelet   Recommended regular exercise, BP monitoring, weight control, and discussed med and SE's. Recommended labs to assess and monitor clinical status. Further disposition pending results of labs. Over 30 minutes of exam, counseling, chart review was performed

## 2015-11-07 ENCOUNTER — Other Ambulatory Visit: Payer: Self-pay | Admitting: Neurology

## 2015-11-07 DIAGNOSIS — IMO0002 Reserved for concepts with insufficient information to code with codable children: Secondary | ICD-10-CM

## 2015-11-07 DIAGNOSIS — G43709 Chronic migraine without aura, not intractable, without status migrainosus: Secondary | ICD-10-CM

## 2015-11-07 NOTE — Telephone Encounter (Signed)
Patient called to request refill of HYDROcodone-acetaminophen (NORCO/VICODIN) 5-325 MG tablet, states she's had migraine, took Cambia all day yesterday and last night, took Prednisone this morning.

## 2015-11-07 NOTE — Telephone Encounter (Signed)
LMVM for pt that will send to Dr. Terrace ArabiaYan for refill, this on Monday.  Unfortunately, we close at 1200 on Friday.  May need to check with urgent care, pcp for the weekend.

## 2015-11-10 ENCOUNTER — Other Ambulatory Visit: Payer: Self-pay | Admitting: Physician Assistant

## 2015-11-10 ENCOUNTER — Other Ambulatory Visit: Payer: Self-pay | Admitting: *Deleted

## 2015-11-10 MED ORDER — HYDROCODONE-ACETAMINOPHEN 5-325 MG PO TABS: 1.0000 | ORAL_TABLET | ORAL | 0 refills | Status: DC | PRN

## 2015-11-10 MED ORDER — AMPHETAMINE-DEXTROAMPHETAMINE 10 MG PO TABS: ORAL_TABLET | ORAL | 0 refills | Status: DC

## 2015-11-10 NOTE — Telephone Encounter (Signed)
Rx printed, signed and placed up front for pick up. Patient aware.

## 2015-11-13 ENCOUNTER — Other Ambulatory Visit: Payer: Self-pay | Admitting: Physician Assistant

## 2015-11-17 ENCOUNTER — Telehealth: Payer: Self-pay | Admitting: Neurology

## 2015-11-17 NOTE — Telephone Encounter (Signed)
Called and left patient a message to Please call [facility] at [phone] today if she could so I can get her Botox Shipped.

## 2015-11-18 DIAGNOSIS — G8114 Spastic hemiplegia affecting left nondominant side: Secondary | ICD-10-CM | POA: Diagnosis not present

## 2015-11-18 NOTE — Telephone Encounter (Signed)
Patient called and gave consent TBD 11/19/2015 apt 11/20/2015.

## 2015-11-20 ENCOUNTER — Ambulatory Visit (INDEPENDENT_AMBULATORY_CARE_PROVIDER_SITE_OTHER): Payer: BLUE CROSS/BLUE SHIELD | Admitting: Neurology

## 2015-11-20 ENCOUNTER — Encounter: Payer: Self-pay | Admitting: Neurology

## 2015-11-20 VITALS — BP 151/93 | HR 89 | Ht 68.5 in | Wt 154.8 lb

## 2015-11-20 DIAGNOSIS — G8114 Spastic hemiplegia affecting left nondominant side: Secondary | ICD-10-CM

## 2015-11-20 DIAGNOSIS — G43019 Migraine without aura, intractable, without status migrainosus: Secondary | ICD-10-CM

## 2015-11-20 NOTE — Progress Notes (Signed)
**  Botox 100 units x 3 vials, Lot [id number], Exp 06/2018, NDC [id number], specialty pharmacy. Mck, rn.**

## 2015-11-20 NOTE — Progress Notes (Signed)
Chief Complaint  Patient presents with  . Left Spastic Hemiplegia    Botox 300 units - specialty pharmacy      PATIENT: Ruth Gregory DOB: [birth year]  Chief Complaint  Patient presents with  . Left Spastic Hemiplegia    Botox 300 units - specialty pharmacy     HISTORICAL  Ann Bohne is a 55 year old right-handed female  History of right internal carotid section with spastic left hemiparesis, Botox injection every 3 months  She has history of right internal carotid artery dissection following an episode of vomiting from migraine headache in mid June 2009. She was given IV TPA and found to have right MCA occlusion. She underwent emergent right carotid artery stent with distal endovascular recanalization of the middle cerebral artery.  She had small hemorrhagic transformation of the right basal ganglia with mild left hemiparesis, but has done well since then and has been independent.  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  She has been receiving BOTOX injection since 01/2009 every 3 months, to her left upper extremity and also left lower extremity, responded well.  Repeat US carotid were normal in October 2013  Chronic migraine headaches: She had long-standing history of chronic migraine, average 2-4 headaches each months, she has increased frequency headaches around fall, and spring, she is now taking Topamax ER 150 mg every night as preventive medication, magnesium oxide 400 mg,  riboflavin 100 mg twice a day,  She is not candidate for triptan because previous history of stroke, Cambia works well for her, she her insurance only allow her to have 9 tablets each months. Occasionally she take Fioricet, hydrocodone as needed, even prednisone 20 mg as needed as rescue therapy  Over the past few weeks, she been having to 3 headaches each week, used out her cambia, she also complains of neck muscle tightness, receiving chiropractor sometimes   Last Botox injection to spastic left upper extremity works well, but she noticed left hand muscle weakness.  UPDATE Mar 26 2015: She responded very well to previous Botox injection in September 2016, but noticed increased left hand weakness, gradually recovered over the past few weeks  Update July 15 2015: She responded very well to previous Botox injection in December 20 eighth 2016, noticed returning of left finger flexion spasticity, left ankle plantarflexion, want today's injection emphasize on above abnormal posturing.  She still has frequent migraine headaches, 1-2 headache each week, some headache was protracted, lasting 1-2 days, require multiple dose of cambia and Fioricet  Update November 20 2015: Patient called in September 09 2015 "She also said the left hand is very weak- 12,3 digits > 4,5. She said last OV she rec'd botox in wrist. She sts she may not be able to get botox next month if she is not feeling better"  She has a lot of pain on her left leg, mainly her left ankle from knee down, throbbing, tooth ache, she noticed more tendency of left ankle plantar flexion.  She continue have intermittent migraine headaches, more  at the left retro-orbital  area  Review of system: Pertinent as above   ALLERGIES: Allergies  Allergen Reactions  . Penicillins Anaphylaxis and Hives  . Imitrex [Sumatriptan]   . Other Swelling    kiwi  . Sulfa Antibiotics Hives  . Zomig [Zolmitriptan]     HOME MEDICATIONS: Current Outpatient  Prescriptions  Medication Sig Dispense Refill  . ALPRAZolam (XANAX) 1 MG tablet     . amphetamine-dextroamphetamine (ADDERALL) 10 MG tablet Take 1/2 to 1 tablet 1 or 2 x daily for ADD 60 tablet 0  . aspirin 325 MG EC tablet Take 325 mg by mouth daily.    . Botulinum Toxin Type A (BOTOX) 200 UNITS SOLR Inject as directed. Change to BOTOX A 100  units    . buPROPion (WELLBUTRIN XL) 300 MG 24 hr tablet TAKE 1 TABLET (300 MG TOTAL) BY MOUTH DAILY. WITH FOOD 90 tablet 3  . butalbital-acetaminophen-caffeine (FIORICET, ESGIC) 50-325-40 MG tablet TAKE 1 TABLET BY MOUTH EVERY 6 HOURS AS NEEDED FOR HEADACHE 30 tablet 5  . cetirizine (ZYRTEC) 10 MG tablet Take 10 mg by mouth daily.    . Diclofenac Potassium (CAMBIA) 50 MG PACK Take one packet at onset of migraine.  May repeat once in two hours, if needed.  Max dose 2 packets per day.  #27/90days 27 each 3  . ESTRING 2 MG vaginal ring Place 2 mg vaginally every 3 (three) months.     . fenofibrate micronized (LOFIBRA) 134 MG capsule TAKE 1 CAPSULE BY MOUTH EVERY DAY 90 capsule 3  . fluticasone (FLONASE) 50 MCG/ACT nasal spray PLACE 2 SPRAYS INTO BOTH NOSTRILS DAILY. 48 g 1  . fluticasone furoate-vilanterol (BREO ELLIPTA) 100-25 MCG/INH AEPB Inhale 1 puff into the lungs daily. Rinse mouth with water after each use 3 each 3  . HYDROcodone-acetaminophen (NORCO/VICODIN) 5-325 MG tablet Take 1 tablet by mouth every 4 (four) hours as needed for moderate pain. 30 tablet 0  . levothyroxine (SYNTHROID, LEVOTHROID) 100 MCG tablet Take 1 tablet (100 mcg total) by mouth daily. 90 tablet 3  . magnesium oxide (MAG-OX) 400 MG tablet Take 400 mg by mouth 2 (two) times daily.     . montelukast (SINGULAIR) 10 MG tablet Take 1 tablet (10 mg total) by mouth at bedtime. 90 tablet 3  . Multiple Vitamin (MULTIVITAMIN WITH MINERALS) TABS tablet Take 1 tablet by mouth daily.    Marland Kitchen omeprazole (PRILOSEC) 40 MG capsule TAKE 1 CAPSULE (40 MG TOTAL) BY MOUTH DAILY. FOR ACID INDIGESTION AND  REFLUX 90 capsule 3  . predniSONE (DELTASONE) 20 MG tablet Take 1 tablet per day as needed for headaches. 90 tablet 1  . prochlorperazine (COMPAZINE) 5 MG tablet TAKE 1 TABLET BY MOUTH 3 TIMES A DAY AS NEEDED FOR NAUSEA 50 tablet 1  . PROVENTIL HFA 108 (90 BASE) MCG/ACT inhaler INHALE 2 PUFFS EVERY 4 HOURS AS NEEDED FOR ASTHMA RESCUE 6.7 Inhaler 0  . ranitidine (ZANTAC) 300 MG tablet Take 1 to 2 tablets daily for heartburn & reflux to allow wean and transition from PPI / Omeprazole 180 tablet 1  . Riboflavin 100 MG TABS Take 100 mg by mouth. bid    . rosuvastatin (CRESTOR) 40 MG tablet Take 1 tablet (40 mg total) by mouth daily. 90 tablet 3  . Topiramate ER (QUDEXY XR) 150 MG CS24 Take 1 capsule by mouth daily. 90 each 1  . vitamin B-12 (CYANOCOBALAMIN) 1000 MCG tablet Take 1,000 mcg by mouth daily.    . Vitamin  D, Ergocalciferol, (DRISDOL) 50000 units CAPS capsule TAKE 1 CAPSULE DAILY 30 capsule 1   No current facility-administered medications for this visit.     PAST MEDICAL HISTORY: Past Medical History:  Diagnosis Date  . Anemia   . Anxiety   . Asthma   . Chronic heartburn   . Depression   . Elevated cholesterol    on Crestor  . Headache(784.0)   . Hypertension   . Hypothyroidism    S/P thyroidectomy  . Migraine   . Occlusion and stenosis of carotid artery with cerebral infarction   . Spasm of muscle   . Unspecified cerebral artery occlusion with cerebral infarction   . Vitamin D deficiency     PAST SURGICAL HISTORY: Past Surgical History:  Procedure Laterality Date  . COSMETIC SURGERY    . CYSTOCELE REPAIR N/A 01/08/2013   Procedure: ANTERIOR REPAIR (CYSTOCELE);  Surgeon: Meriel Picaichard M Holland, MD;  Location: [facility];  Service: Gynecology;  Laterality: N/A;  . NASAL SINUS SURGERY    . VAGINAL HYSTERECTOMY      FAMILY HISTORY: Family History  Problem Relation Age of Onset  . Heart disease Mother   . Peptic Ulcer Disease Mother   . Heart disease Father   . Parkinson's  disease Father     SOCIAL HISTORY:  Social History   Social History  . Marital status: Married    Spouse name: Brett CanalesSteve  . Number of children: 2  . Years of education: college   Occupational History  . DESIGNER Self   Social History Main Topics  . Smoking status: Never Smoker  . Smokeless tobacco: Never Used  . Alcohol use 0.0 oz/week     Comment: occassionally drinks a glass of wine  . Drug use: No  . Sexual activity: Not on file   Other Topics Concern  . Not on file   Social History Narrative   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 Patient is married and lives with her husband. Patient has college education.   Right handed.   Caffeine- two cups daily.     PHYSICAL EXAM   Vitals:   11/20/15 1549  BP: (!) 151/93  Pulse: 89  Weight: 154 lb 12 oz (70.2 kg)  Height: 5' 8.5" (1.74 m)    Not recorded      Body mass index is 23.19 kg/m.  PHYSICAL EXAMNIATION:  Gen: NAD, conversant, well nourised, obese, well groomed                     Cardiovascular: Regular rate rhythm, no peripheral edema, warm, nontender. Eyes: Conjunctivae clear without exudates or hemorrhage Neck: Supple, no carotid bruise. Pulmonary: Clear to auscultation bilaterally   NEUROLOGICAL EXAM:  MENTAL STATUS: Speech:    Speech is normal; fluent and spontaneous with normal comprehension.  Cognition:     Orientation to time, place and person     Normal recent and remote memory     Normal Attention span and concentration     Normal Language, naming, repeating,spontaneous speech     Fund of knowledge   CRANIAL NERVES: CN II: Visual fields are full to confrontation. Fundoscopic exam is normal with sharp discs and no vascular changes. Pupils are round equal and briskly reactive to light. CN III, IV, VI: extraocular movement are normal. No ptosis. CN V: Facial sensation is intact to pinprick in all 3 divisions bilaterally. Corneal responses are intact.  CN VII: Face is  symmetric with normal eye  closure and smile. CN VIII: Hearing is normal to rubbing fingers CN IX, X: Palate elevates symmetrically. Phonation is normal. CN XI: Head turning and shoulder shrug are intact CN XII: Tongue is midline with normal movements and no atrophy.  MOTOR: Mild spastic left hemiparesis, left upper extremity proximal and distal 4/5, mild left hip flexion and left ankle dorsiflexion weakness  REFLEXES: Reflexes are 2+ and symmetric at the biceps, triceps, knees, and ankles. Plantar responses are flexor.  SENSORY: Intact to light touch, pinprick, position sense, and vibration sense are intact in fingers and toes.  COORDINATION: Rapid alternating movements and fine finger movements are intact. There is no dysmetria on finger-to-nose and heel-knee-shin.    GAIT/STANCE: Mildly unsteady gait, checking her left foot across the floor  Romberg is absent.   DIAGNOSTIC DATA (LABS, IMAGING, TESTING) - I reviewed patient records, labs, notes, testing and imaging myself where available.   ASSESSMENT AND PLAN  Ruth CornKim Gregory is a 55 y.o. female  Chronic migraine headaches  Continue preventive medications, extended release topiramate 150 mg every day  Cambia, Fioricet, Compazine as needed,  Continue magnesium oxide, riboflavin as preventive medications,     Spastic left hemiparesis following right internal carotid artery dissection, right MCA stroke  Electric stimulation guided Botox injection, used 300 units of Botox A  Left pronator teres 25 units Left flexor carpi ulnaris 25 units Left flexor digitorum profoundi 25 units Left flexor digitorum superficialis 25 units  Left pectoralis major 25 units Left teres major 25 units Left levator scapula 25 unit Left splenius cervix 25 units  Left tibialis posterior 25 unitsx2= 50 units Left flexor digitorum longus 25 unitsx2= 50 units  Levert FeinsteinYijun Gladyes Kudo, M.D. Ph.D.  [facility] [address], [address] [place], [place] [address] Ph: [phone] Fax: [phone]  CC: Referring Provider

## 2015-12-16 ENCOUNTER — Other Ambulatory Visit: Payer: Self-pay | Admitting: Internal Medicine

## 2015-12-16 DIAGNOSIS — F411 Generalized anxiety disorder: Secondary | ICD-10-CM

## 2016-01-19 ENCOUNTER — Other Ambulatory Visit: Payer: Self-pay | Admitting: Physician Assistant

## 2016-01-26 ENCOUNTER — Other Ambulatory Visit: Payer: Self-pay | Admitting: Physician Assistant

## 2016-01-26 MED ORDER — AMPHETAMINE-DEXTROAMPHETAMINE 10 MG PO TABS: ORAL_TABLET | ORAL | 0 refills | Status: DC

## 2016-02-09 ENCOUNTER — Ambulatory Visit (INDEPENDENT_AMBULATORY_CARE_PROVIDER_SITE_OTHER): Payer: BLUE CROSS/BLUE SHIELD | Admitting: Physician Assistant

## 2016-02-09 ENCOUNTER — Encounter: Payer: Self-pay | Admitting: Physician Assistant

## 2016-02-09 VITALS — BP 130/80 | HR 78 | Temp 97.6°F | Resp 14 | Ht 68.5 in | Wt 161.6 lb

## 2016-02-09 DIAGNOSIS — I63031 Cerebral infarction due to thrombosis of right carotid artery: Secondary | ICD-10-CM | POA: Diagnosis not present

## 2016-02-09 DIAGNOSIS — R7303 Prediabetes: Secondary | ICD-10-CM

## 2016-02-09 DIAGNOSIS — E559 Vitamin D deficiency, unspecified: Secondary | ICD-10-CM | POA: Diagnosis not present

## 2016-02-09 DIAGNOSIS — Z23 Encounter for immunization: Secondary | ICD-10-CM

## 2016-02-09 DIAGNOSIS — G8114 Spastic hemiplegia affecting left nondominant side: Secondary | ICD-10-CM

## 2016-02-09 DIAGNOSIS — I679 Cerebrovascular disease, unspecified: Secondary | ICD-10-CM

## 2016-02-09 DIAGNOSIS — E782 Mixed hyperlipidemia: Secondary | ICD-10-CM | POA: Diagnosis not present

## 2016-02-09 DIAGNOSIS — Z79899 Other long term (current) drug therapy: Secondary | ICD-10-CM | POA: Diagnosis not present

## 2016-02-09 DIAGNOSIS — R030 Elevated blood-pressure reading, without diagnosis of hypertension: Secondary | ICD-10-CM | POA: Diagnosis not present

## 2016-02-09 DIAGNOSIS — E890 Postprocedural hypothyroidism: Secondary | ICD-10-CM | POA: Diagnosis not present

## 2016-02-09 LAB — CBC WITH DIFFERENTIAL/PLATELET
Basophils Absolute: 0 cells/uL (ref 0–200)
Basophils Relative: 0 %
Eosinophils Absolute: 150 cells/uL (ref 15–500)
Eosinophils Relative: 3 %
HCT: 43.1 % (ref 35.0–45.0)
Hemoglobin: 14.9 g/dL (ref 11.7–15.5)
Lymphocytes Relative: 40 %
Lymphs Abs: 2000 cells/uL (ref 850–3900)
MCH: 32.5 pg (ref 27.0–33.0)
MCHC: 34.6 g/dL (ref 32.0–36.0)
MCV: 94.1 fL (ref 80.0–100.0)
MPV: 10.6 fL (ref 7.5–12.5)
Monocytes Absolute: 350 cells/uL (ref 200–950)
Monocytes Relative: 7 %
Neutro Abs: 2500 cells/uL (ref 1500–7800)
Neutrophils Relative %: 50 %
Platelets: 206 10*3/uL (ref 140–400)
RBC: 4.58 MIL/uL (ref 3.80–5.10)
RDW: 13.8 % (ref 11.0–15.0)
WBC: 5 10*3/uL (ref 3.8–10.8)

## 2016-02-09 LAB — TSH: TSH: 0.45 mIU/L

## 2016-02-09 NOTE — Patient Instructions (Signed)
GETTING OFF OF PPI's    Nexium/protonix/prilosec/Omeprazole/Dexilant/Aciphex are called PPI's, they are great at healing your stomach but should only be taken for a short period of time.     Recent studies have shown that taken for a long time they  can increase the risk of osteoporosis (weakening of your bones), pneumonia, low magnesium, restless legs, Cdiff (infection that causes diarrhea), DEMENTIA and most recently kidney damage / disease / insufficiency.     Due to this information we want to try to stop the PPI but if you try to stop it abruptly this can cause rebound acid and worsening symptoms.   So this is how we want you to get off the PPI:  - Start taking the nexium/protonix/prilosec/PPI  every other day with  zantac (ranitidine) 2 x a day for 2-4 weeks - some people stay on this dosage and can not taper off further. Our main goal is to limit the dosage and amount you are taking so if you need to stay on this dose.   - then decrease the PPI to every 3 days while taking the zantac (ranitidine) 300mg twice a day the other  days for 2-4  Weeks  - then you can try the zantac (ranitidine) 300mg once at night or up to 2 x day as needed.  - you can continue on this once at night or stop all together  - Avoid alcohol, spicy foods, NSAIDS (aleve, ibuprofen) at this time. See foods below.   +++++++++++++++++++++++++++++++++++++++++++  Food Choices for Gastroesophageal Reflux Disease  When you have gastroesophageal reflux disease (GERD), the foods you eat and your eating habits are very important. Choosing the right foods can help ease the discomfort of GERD. WHAT GENERAL GUIDELINES DO I NEED TO FOLLOW?  Choose fruits, vegetables, whole grains, low-fat dairy products, and low-fat meat, fish, and poultry.  Limit fats such as oils, salad dressings, butter, nuts, and avocado.  Keep a food diary to identify foods that cause symptoms.  Avoid foods that cause reflux. These may be  different for different people.  Eat frequent small meals instead of three large meals each day.  Eat your meals slowly, in a relaxed setting.  Limit fried foods.  Cook foods using methods other than frying.  Avoid drinking alcohol.  Avoid drinking large amounts of liquids with your meals.  Avoid bending over or lying down until 2-3 hours after eating.   WHAT FOODS ARE NOT RECOMMENDED? The following are some foods and drinks that may worsen your symptoms:  Vegetables Tomatoes. Tomato juice. Tomato and spaghetti sauce. Chili peppers. Onion and garlic. Horseradish. Fruits Oranges, grapefruit, and lemon (fruit and juice). Meats High-fat meats, fish, and poultry. This includes hot dogs, ribs, ham, sausage, salami, and bacon. Dairy Whole milk and chocolate milk. Sour cream. Cream. Butter. Ice cream. Cream cheese.  Beverages Coffee and tea, with or without caffeine. Carbonated beverages or energy drinks. Condiments Hot sauce. Barbecue sauce.  Sweets/Desserts Chocolate and cocoa. Donuts. Peppermint and spearmint. Fats and Oils High-fat foods, including French fries and potato chips. Other Vinegar. Strong spices, such as black pepper, white pepper, red pepper, cayenne, curry powder, cloves, ginger, and chili powder. Nexium/protonix/prilosec are called PPI's, they are great at healing your stomach but should only be taken for a short period of time.    

## 2016-02-09 NOTE — Progress Notes (Signed)
Assessment and Plan:   Hypertension -Continue medication, monitor blood pressure at home. Continue DASH diet.  Reminder to go to the ER if any CP, SOB, nausea, dizziness, severe HA, changes vision/speech, left arm numbness and tingling and jaw pain.   Cholesterol -Continue diet and exercise. Check cholesterol since off crestor   Prediabetes  -Continue diet and exercise. Check A1C   Vitamin D Def - check level and continue medications.    Migraine ? Allergy related/TMJ- continue meds PRN, discuss with Dr. Terrace ArabiaYan, take xanx PRN  Hypothyroidism- check TSH level, continue medications the same, reminded to take on an empty stomach 30-2360mins before food.   Continue diet and meds as discussed. Further disposition pending results of labs. Over 30 minutes of exam, counseling, chart review, and critical decision making was performed  HPI 55 y.o. female  presents for 3 month follow up on hypertension, cholesterol, prediabetes, and vitamin D deficiency.   Her blood pressure has been controlled at home, today their BP is BP: 130/80  She does workout. She denies chest pain, shortness of breath, dizziness.  She had a Right MCA CVA attributed to a Rt Int Carotid Art Dissection suspect for Fibromuscular dysplasia and manifest at that time with a left spastic HP not totally resolved for which she still receives Botox injections by Dr Terrace ArabiaYan preventing flexion contractures of the LUE.  She has high anxiety for social situations and had a lot of anxiety on easter which worsens her migraines.  She is on adderall which helps with concentration, and follows with Dr. Terrace ArabiaYan. She is interested in trying adding on testosterone to her estrogen.     She is on cholesterol medication, she is off crestor due to fatigue/myalgias, states she thinks it is the generic. Her cholesterol is at goal. The cholesterol last visit was:   Lab Results  Component Value Date   CHOL 208 (H) 10/24/2015   HDL 57 10/24/2015   LDLCALC 114  10/24/2015   TRIG 186 (H) 10/24/2015   CHOLHDL 3.6 10/24/2015    She has been working on diet and exercise for prediabetes, and denies paresthesia of the feet, polydipsia, polyuria and visual disturbances. Last A1C in the office was:  Lab Results  Component Value Date   HGBA1C 5.6 10/24/2015  Patient is on Vitamin D supplement.   Lab Results  Component Value Date   VD25OH 96 10/24/2015   She is on thyroid medication. Her medication was not changed last visit.   Lab Results  Component Value Date   TSH 0.96 10/15/2015    Current Medications:  Current Outpatient Prescriptions on File Prior to Visit  Medication Sig Dispense Refill  . ALPRAZolam (XANAX) 1 MG tablet TAKE 1/2 TO 1 TABLET BY MOUTH 3 TIMES A DAY AS NEEDED FOR ANXIETY 270 tablet 1  . amphetamine-dextroamphetamine (ADDERALL) 10 MG tablet Take 1/2 to 1 tablet 1 or 2 x daily for ADD 60 tablet 0  . aspirin 325 MG EC tablet Take 325 mg by mouth daily.    . Botulinum Toxin Type A (BOTOX) 200 UNITS SOLR Inject as directed. Change to BOTOX A 100  units    . buPROPion (WELLBUTRIN XL) 300 MG 24 hr tablet TAKE 1 TABLET (300 MG TOTAL) BY MOUTH DAILY. WITH FOOD 90 tablet 3  . butalbital-acetaminophen-caffeine (FIORICET, ESGIC) 50-325-40 MG tablet TAKE 1 TABLET BY MOUTH EVERY 6 HOURS AS NEEDED FOR HEADACHE 30 tablet 5  . cetirizine (ZYRTEC) 10 MG tablet Take 10 mg by mouth daily.    .Marland Kitchen  Diclofenac Potassium (CAMBIA) 50 MG PACK Take one packet at onset of migraine.  May repeat once in two hours, if needed.  Max dose 2 packets per day.  #27/90days 27 each 3  . ESTRING 2 MG vaginal ring Place 2 mg vaginally every 3 (three) months.     . fenofibrate micronized (LOFIBRA) 134 MG capsule TAKE 1 CAPSULE BY MOUTH EVERY DAY 90 capsule 3  . fluticasone (FLONASE) 50 MCG/ACT nasal spray PLACE 2 SPRAYS INTO BOTH NOSTRILS DAILY. 48 g 1  . fluticasone furoate-vilanterol (BREO ELLIPTA) 100-25 MCG/INH AEPB Inhale 1 puff into the lungs daily. Rinse mouth with  water after each use 3 each 3  . HYDROcodone-acetaminophen (NORCO/VICODIN) 5-325 MG tablet Take 1 tablet by mouth every 4 (four) hours as needed for moderate pain. 30 tablet 0  . levothyroxine (SYNTHROID, LEVOTHROID) 100 MCG tablet Take 1 tablet (100 mcg total) by mouth daily. 90 tablet 3  . magnesium oxide (MAG-OX) 400 MG tablet Take 400 mg by mouth 2 (two) times daily.     . montelukast (SINGULAIR) 10 MG tablet Take 1 tablet (10 mg total) by mouth at bedtime. 90 tablet 3  . Multiple Vitamin (MULTIVITAMIN WITH MINERALS) TABS tablet Take 1 tablet by mouth daily.    Marland Kitchen omeprazole (PRILOSEC) 40 MG capsule TAKE 1 CAPSULE (40 MG TOTAL) BY MOUTH DAILY. FOR ACID INDIGESTION AND REFLUX 90 capsule 3  . prochlorperazine (COMPAZINE) 5 MG tablet TAKE 1 TABLET BY MOUTH 3 TIMES A DAY AS NEEDED FOR NAUSEA 50 tablet 1  . PROVENTIL HFA 108 (90 BASE) MCG/ACT inhaler INHALE 2 PUFFS EVERY 4 HOURS AS NEEDED FOR ASTHMA RESCUE 6.7 Inhaler 0  . ranitidine (ZANTAC) 300 MG tablet Take 1 to 2 tablets daily for heartburn & reflux to allow wean and transition from PPI / Omeprazole 180 tablet 1  . Riboflavin 100 MG TABS Take 100 mg by mouth. bid    . rosuvastatin (CRESTOR) 40 MG tablet Take 1 tablet (40 mg total) by mouth daily. 90 tablet 3  . Topiramate ER (QUDEXY XR) 150 MG CS24 Take 1 capsule by mouth daily. 90 each 1  . vitamin B-12 (CYANOCOBALAMIN) 1000 MCG tablet Take 1,000 mcg by mouth daily.    . Vitamin D, Ergocalciferol, (DRISDOL) 50000 units CAPS capsule TAKE 1 CAPSULE DAILY 30 capsule 1   No current facility-administered medications on file prior to visit.    Medical History:  Past Medical History:  Diagnosis Date  . Anemia   . Anxiety   . Asthma   . Chronic heartburn   . Depression   . Elevated cholesterol    on Crestor  . Headache(784.0)   . Hypertension   . Hypothyroidism    S/P thyroidectomy  . Migraine   . Occlusion and stenosis of carotid artery with cerebral infarction   . Spasm of muscle   .  Unspecified cerebral artery occlusion with cerebral infarction   . Vitamin D deficiency    Allergies:  Allergies  Allergen Reactions  . Penicillins Anaphylaxis and Hives  . Imitrex [Sumatriptan]   . Other Swelling    kiwi  . Sulfa Antibiotics Hives  . Zomig [Zolmitriptan]      Review of Systems:  Review of Systems  Constitutional: Positive for malaise/fatigue. Negative for chills, diaphoresis, fever and weight loss.  HENT: Negative for congestion, ear discharge, ear pain, hearing loss, nosebleeds, sore throat and tinnitus.   Eyes: Negative.   Respiratory: Negative for cough, hemoptysis, sputum production, shortness of breath,  wheezing and stridor.   Cardiovascular: Negative.  Negative for chest pain and leg swelling.  Gastrointestinal: Negative.  Negative for blood in stool.  Genitourinary: Negative.  Negative for dysuria, frequency and urgency.  Musculoskeletal: Positive for back pain, joint pain and neck pain. Negative for falls and myalgias.  Skin: Negative.   Neurological: Positive for headaches. Negative for dizziness, tingling, tremors, sensory change, speech change, focal weakness, seizures, loss of consciousness and weakness.  Psychiatric/Behavioral: Positive for depression and memory loss. Negative for hallucinations, substance abuse and suicidal ideas. The patient is nervous/anxious and has insomnia.     Family history- Review and unchanged Social history- Review and unchanged Physical Exam: BP 130/80   Pulse 78   Temp 97.6 F (36.4 C)   Resp 14   Ht 5' 8.5" (1.74 m)   Wt 161 lb 9.6 oz (73.3 kg)   SpO2 99%   BMI 24.21 kg/m  Wt Readings from Last 3 Encounters:  02/09/16 161 lb 9.6 oz (73.3 kg)  11/20/15 154 lb 12 oz (70.2 kg)  10/24/15 158 lb 3.2 oz (71.8 kg)   General Appearance: Well nourished, in no apparent distress. Eyes: PERRLA, EOMs, conjunctiva no swelling or erythema, normal fundi and vessels. Sinuses: No frontal/maxillary tenderness ENT/Mouth: EACs  patent / TMs  nl. Nares clear without erythema, swelling, mucoid exudates. Oral hygiene is good. No erythema, swelling, or exudate. Tongue normal, non-obstructing. Tonsils not swollen or erythematous. Hearing normal. + TMJ tenderness Neck: Supple, thyroid normal. No bruits, nodes or JVD. Respiratory: Respiratory effort normal.  BS equal and clear bilateral without rales, rhonci, wheezing or stridor. Cardio: Heart sounds are normal with regular rate and rhythm and no murmurs, rubs or gallops. Peripheral pulses are normal and equal bilaterally without edema. No aortic or femoral bruits. Chest: symmetric with normal excursions and percussion. Breasts: Symmetric, without lumps, nipple discharge, retractions, or fibrocystic changes.  Abdomen: Flat, soft, with bowl sounds. Nontender, no guarding, rebound, hernias, masses, or organomegaly.  Lymphatics: Non tender without lymphadenopathy.  Musculoskeletal:  Mild spastic Lt HP and flexion type contracturing of the Lt wrist and hand. Otherwise full ROM of the Rt extremities, with normal joint stability, strength on the Rt  and  gait is normal  Skin: Warm and dry without rashes, lesions, cyanosis, clubbing or  ecchymosis.  Neuro: Cranial nerves intact, reflexes slightly increased on the Left and muscle tone likewise sl. Increased on the Left. No cerebellar symptoms. Sensation intact.  Pysch: Alert and oriented X 3, normal affect.    Quentin MullingAmanda Lovelee Forner, PA-C 10:51 AM [facility]

## 2016-02-10 LAB — VITAMIN D 25 HYDROXY (VIT D DEFICIENCY, FRACTURES): Vit D, 25-Hydroxy: 54 ng/mL (ref 30–100)

## 2016-02-10 LAB — BASIC METABOLIC PANEL WITHOUT GFR
BUN: 15 mg/dL (ref 7–25)
CO2: 23 mmol/L (ref 20–31)
Calcium: 9.4 mg/dL (ref 8.6–10.4)
Chloride: 111 mmol/L — ABNORMAL HIGH (ref 98–110)
Creat: 1.17 mg/dL — ABNORMAL HIGH (ref 0.50–1.05)
GFR, Est African American: 60 mL/min
GFR, Est Non African American: 52 mL/min — ABNORMAL LOW
Glucose, Bld: 91 mg/dL (ref 65–99)
Potassium: 4 mmol/L (ref 3.5–5.3)
Sodium: 143 mmol/L (ref 135–146)

## 2016-02-10 LAB — LIPID PANEL
Cholesterol: 215 mg/dL — ABNORMAL HIGH
HDL: 68 mg/dL
LDL Cholesterol: 119 mg/dL — ABNORMAL HIGH
Total CHOL/HDL Ratio: 3.2 ratio
Triglycerides: 142 mg/dL
VLDL: 28 mg/dL

## 2016-02-10 LAB — HEPATIC FUNCTION PANEL
ALT: 19 U/L (ref 6–29)
AST: 14 U/L (ref 10–35)
Albumin: 4.3 g/dL (ref 3.6–5.1)
Alkaline Phosphatase: 58 U/L (ref 33–130)
Bilirubin, Direct: 0.1 mg/dL
Indirect Bilirubin: 0.4 mg/dL (ref 0.2–1.2)
Total Bilirubin: 0.5 mg/dL (ref 0.2–1.2)
Total Protein: 6.6 g/dL (ref 6.1–8.1)

## 2016-02-10 LAB — HEMOGLOBIN A1C
Hgb A1c MFr Bld: 5.4 %
Mean Plasma Glucose: 108 mg/dL

## 2016-02-10 LAB — MAGNESIUM: Magnesium: 2.4 mg/dL (ref 1.5–2.5)

## 2016-02-12 ENCOUNTER — Telehealth: Payer: Self-pay | Admitting: Neurology

## 2016-02-12 ENCOUNTER — Other Ambulatory Visit: Payer: Self-pay | Admitting: *Deleted

## 2016-02-12 DIAGNOSIS — G43709 Chronic migraine without aura, not intractable, without status migrainosus: Secondary | ICD-10-CM

## 2016-02-12 DIAGNOSIS — IMO0002 Reserved for concepts with insufficient information to code with codable children: Secondary | ICD-10-CM

## 2016-02-12 MED ORDER — HYDROCODONE-ACETAMINOPHEN 5-325 MG PO TABS: 1.0000 | ORAL_TABLET | ORAL | 0 refills | Status: DC | PRN

## 2016-02-12 NOTE — Telephone Encounter (Signed)
Pt request refill for HYDROcodone-acetaminophen (NORCO/VICODIN) 5-325 MG tablet  Pt aware the clinic closes at noon on Friday

## 2016-02-12 NOTE — Telephone Encounter (Signed)
Printed, signed and placed up front for pick up.  Pt aware.

## 2016-02-12 NOTE — Telephone Encounter (Signed)
Pt's appt was r/s to 12/6. Please call to let her know when botox arrives

## 2016-02-21 ENCOUNTER — Other Ambulatory Visit: Payer: Self-pay | Admitting: Physician Assistant

## 2016-02-26 ENCOUNTER — Ambulatory Visit: Payer: BLUE CROSS/BLUE SHIELD | Admitting: Neurology

## 2016-03-02 DIAGNOSIS — G8114 Spastic hemiplegia affecting left nondominant side: Secondary | ICD-10-CM | POA: Diagnosis not present

## 2016-03-03 ENCOUNTER — Telehealth: Payer: Self-pay | Admitting: Neurology

## 2016-03-03 ENCOUNTER — Encounter: Payer: Self-pay | Admitting: Neurology

## 2016-03-03 ENCOUNTER — Ambulatory Visit (INDEPENDENT_AMBULATORY_CARE_PROVIDER_SITE_OTHER): Payer: BLUE CROSS/BLUE SHIELD | Admitting: Neurology

## 2016-03-03 VITALS — BP 132/88 | HR 94 | Ht 68.5 in | Wt 156.2 lb

## 2016-03-03 DIAGNOSIS — I679 Cerebrovascular disease, unspecified: Secondary | ICD-10-CM

## 2016-03-03 DIAGNOSIS — I63031 Cerebral infarction due to thrombosis of right carotid artery: Secondary | ICD-10-CM | POA: Diagnosis not present

## 2016-03-03 DIAGNOSIS — G8114 Spastic hemiplegia affecting left nondominant side: Secondary | ICD-10-CM

## 2016-03-03 DIAGNOSIS — G43019 Migraine without aura, intractable, without status migrainosus: Secondary | ICD-10-CM

## 2016-03-03 MED ORDER — NORTRIPTYLINE HCL 25 MG PO CAPS: 50.0000 mg | ORAL_CAPSULE | Freq: Every day | ORAL | 11 refills | Status: DC

## 2016-03-03 MED ORDER — TIZANIDINE HCL 2 MG PO TABS: 2.0000 mg | ORAL_TABLET | Freq: Four times a day (QID) | ORAL | 6 refills | Status: DC | PRN

## 2016-03-03 NOTE — Telephone Encounter (Signed)
PT NEEDS 90 DAY BOTOX APPT.

## 2016-03-03 NOTE — Progress Notes (Signed)
Chief Complaint  Patient presents with  . Left spastic hemiparesis    Botox 300 units - specialty pharmacy      PATIENT: Ruth Gregory DOB: February 02, 1961  Chief Complaint  Patient presents with  . Left spastic hemiparesis    Botox 300 units - specialty pharmacy     HISTORICAL  Ruth Gregory is a 55 year old right-handed female  History of right internal carotid section with spastic left hemiparesis, Botox injection every 3 months  She has history of right internal carotid artery dissection following an episode of vomiting from migraine headache in mid June 2009. She was given IV TPA and found to have right MCA occlusion. She underwent emergent right carotid artery stent with distal endovascular recanalization of the middle cerebral artery.  She had small hemorrhagic transformation of the right basal ganglia with mild left hemiparesis, but has done well since then and has been independent.  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  She has been receiving BOTOX injection since 01/2009 every 3 months, to her left upper extremity and also left lower extremity, responded well.  Repeat US carotid were normal in October 2013  Chronic migraine headaches: She had long-standing history of chronic migraine, average 2-4 headaches each months, she has increased frequency headaches around fall, and spring, she is now taking Topamax ER 150 mg every night as preventive medication, magnesium oxide 400 mg,  riboflavin 100 mg twice a day,  She is not candidate for triptan because previous history of stroke, Cambia works well for her, she her insurance only allow her to have 9 tablets each months. Occasionally she take Fioricet, hydrocodone as needed, even prednisone 20 mg as needed as rescue therapy  Over the past few weeks, she been having to 3 headaches each week, used out her cambia, she also complains of neck muscle tightness, receiving chiropractor sometimes   Last Botox injection to spastic left upper extremity works well, but she noticed left hand muscle weakness.  UPDATE Mar 26 2015: She responded very well to previous Botox injection in September 2016, but noticed increased left hand weakness, gradually recovered over the past few weeks  Update July 15 2015: She responded very well to previous Botox injection in December 20 eighth 2016, noticed returning of left finger flexion spasticity, left ankle plantarflexion, want today's injection emphasize on above abnormal posturing.  She still has frequent migraine headaches, 1-2 headache each week, some headache was protracted, lasting 1-2 days, require multiple dose of cambia and Fioricet  Update November 20 2015: Patient called in September 09 2015 "She also said the left hand is very weak- 12,3 digits > 4,5. She said last OV she rec'd botox in wrist. She sts she may not be able to get botox next month if she is not feeling better"  She has a lot of pain on her left leg, mainly her left ankle from knee down, throbbing, tooth ache, she noticed more tendency of left ankle plantar flexion.  She continue have intermittent migraine headaches, more  at the left retro-orbital  Area  UPDATE Dec 6th 2017: She responded very well to previous Botox injection, complains frequent migraine headaches, once a week, lasting 1-2 days, partial response to combination of cambia, Fioricet, Compazine, she is taking Topamax ER 150 mg as preventive medications  Review  of system: Pertinent as above   ALLERGIES: Allergies  Allergen Reactions  . Penicillins Anaphylaxis and Hives  . Imitrex [Sumatriptan]   . Other Swelling    kiwi  . Sulfa Antibiotics Hives  . Zomig [Zolmitriptan]     HOME MEDICATIONS: Current Outpatient Prescriptions  Medication Sig Dispense Refill  . ALPRAZolam (XANAX) 1 MG tablet TAKE 1/2 TO 1 TABLET BY MOUTH 3 TIMES A DAY AS NEEDED FOR ANXIETY 270 tablet 1  . aspirin 325 MG EC tablet Take 325 mg by mouth daily.    . Botulinum Toxin Type A (BOTOX) 200 UNITS SOLR Inject as directed. Change to BOTOX A 100  units    . buPROPion (WELLBUTRIN XL) 300 MG 24 hr tablet TAKE 1 TABLET (300 MG TOTAL) BY MOUTH DAILY. WITH FOOD 90 tablet 3  . butalbital-acetaminophen-caffeine (FIORICET, ESGIC) 50-325-40 MG tablet TAKE 1 TABLET BY MOUTH EVERY 6 HOURS AS NEEDED FOR HEADACHE 30 tablet 5  . cetirizine (ZYRTEC) 10 MG tablet Take 10 mg by mouth daily.    . Diclofenac Potassium (CAMBIA) 50 MG PACK Take one packet at onset of migraine.  May repeat once in two hours, if needed.  Max dose 2 packets per day.  #27/90days 27 each 3  . ESTRING 2 MG vaginal ring Place 2 mg vaginally every 3 (three) months.     . fenofibrate micronized (LOFIBRA) 134 MG capsule TAKE 1 CAPSULE BY MOUTH EVERY DAY 90 capsule 3  . fluticasone (FLONASE) 50 MCG/ACT nasal spray PLACE 2 SPRAYS INTO BOTH NOSTRILS DAILY. 48 g 1  . fluticasone furoate-vilanterol (BREO ELLIPTA) 100-25 MCG/INH AEPB Inhale 1 puff into the lungs daily. Rinse mouth with water after each use 3 each 3  . HYDROcodone-acetaminophen (NORCO/VICODIN) 5-325 MG tablet Take 1 tablet by mouth every 4 (four) hours as needed for moderate pain. 30 tablet 0  . levothyroxine (SYNTHROID, LEVOTHROID) 100 MCG tablet Take 1 tablet (100 mcg total) by mouth daily. 90 tablet 3  . magnesium oxide (MAG-OX) 400 MG tablet Take 400 mg by mouth 2 (two) times daily.     . montelukast (SINGULAIR) 10 MG tablet Take 1 tablet (10 mg total) by  mouth at bedtime. 90 tablet 3  . Multiple Vitamin (MULTIVITAMIN WITH MINERALS) TABS tablet Take 1 tablet by mouth daily.    Marland Kitchen. omeprazole (PRILOSEC) 40 MG capsule TAKE 1 CAPSULE (40 MG TOTAL) BY MOUTH DAILY. FOR ACID INDIGESTION AND REFLUX 90 capsule 3  . prochlorperazine (COMPAZINE) 5 MG tablet TAKE 1 TABLET BY MOUTH 3 TIMES A DAY AS NEEDED FOR NAUSEA 50 tablet 1  . PROVENTIL HFA 108 (90 BASE) MCG/ACT inhaler INHALE 2 PUFFS EVERY 4 HOURS AS NEEDED FOR ASTHMA RESCUE 6.7 Inhaler 0  . QUDEXY XR 150 MG CS24 TAKE 1 CAPSULE DAILY 90 each 1  . ranitidine (ZANTAC) 300 MG tablet Take 1 to 2 tablets daily for heartburn & reflux to allow wean and transition from PPI / Omeprazole 180 tablet 1  . Riboflavin 100 MG TABS Take 100 mg by mouth. bid    . vitamin B-12 (CYANOCOBALAMIN) 1000 MCG tablet Take 1,000 mcg by mouth daily.    . Vitamin D, Ergocalciferol, (DRISDOL) 50000 units CAPS capsule TAKE  1 CAPSULE DAILY 30 capsule 1  . amphetamine-dextroamphetamine (ADDERALL) 10 MG tablet Take 1/2 to 1 tablet 1 or 2 x daily for ADD 60 tablet 0   No current facility-administered medications for this visit.     PAST MEDICAL HISTORY: Past Medical History:  Diagnosis Date  . Anemia   . Anxiety   . Asthma   . Chronic heartburn   . Depression   . Elevated cholesterol    on Crestor  . Headache(784.0)   . Hypertension   . Hypothyroidism    S/P thyroidectomy  . Migraine   . Occlusion and stenosis of carotid artery with cerebral infarction   . Spasm of muscle   . Unspecified cerebral artery occlusion with cerebral infarction   . Vitamin D deficiency     PAST SURGICAL HISTORY: Past Surgical History:  Procedure Laterality Date  . COSMETIC SURGERY    . CYSTOCELE REPAIR N/A 01/08/2013   Procedure: ANTERIOR REPAIR (CYSTOCELE);  Surgeon: Meriel Picaichard M Holland, MD;  Location: [facility];  Service: Gynecology;  Laterality: N/A;  . NASAL SINUS SURGERY    . VAGINAL HYSTERECTOMY      FAMILY HISTORY: Family History    Problem Relation Age of Onset  . Heart disease Mother   . Peptic Ulcer Disease Mother   . Heart disease Father   . Parkinson's disease Father     SOCIAL HISTORY:  Social History   Social History  . Marital status: Married    Spouse name: Brett CanalesSteve  . Number of children: 2  . Years of education: college   Occupational History  . DESIGNER Self   Social History Main Topics  . Smoking status: Never Smoker  . Smokeless tobacco: Never Used  . Alcohol use 0.0 oz/week     Comment: occassionally drinks a glass of wine  . Drug use: No  . Sexual activity: Not on file   Other Topics Concern  . Not on file   Social History Narrative   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 Patient is married and lives with her husband. Patient has college education.   Right handed.   Caffeine- two cups daily.     PHYSICAL EXAM   Vitals:   03/03/16 1551  BP: 132/88  Pulse: 94  Weight: 156 lb 4 oz (70.9 kg)  Height: 5' 8.5" (1.74 m)    Not recorded      Body mass index is 23.41 kg/m.  PHYSICAL EXAMNIATION:  Gen: NAD, conversant, well nourised, obese, well groomed                     Cardiovascular: Regular rate rhythm, no peripheral edema, warm, nontender. Eyes: Conjunctivae clear without exudates or hemorrhage Neck: Supple, no carotid bruise. Pulmonary: Clear to auscultation bilaterally   NEUROLOGICAL EXAM:  MENTAL STATUS: Speech:    Speech is normal; fluent and spontaneous with normal comprehension.  Cognition:     Orientation to time, place and person     Normal recent and remote memory     Normal Attention span and concentration     Normal Language, naming, repeating,spontaneous speech     Fund of knowledge   CRANIAL NERVES: CN II: Visual fields are full to confrontation. Fundoscopic exam is normal with sharp discs and no vascular changes. Pupils are round equal and briskly reactive to light. CN III, IV, VI: extraocular movement are normal. No  ptosis. CN V: Facial sensation is intact to pinprick in all 3 divisions  bilaterally. Corneal responses are intact.  CN VII: Face is symmetric with normal eye closure and smile. CN VIII: Hearing is normal to rubbing fingers CN IX, X: Palate elevates symmetrically. Phonation is normal. CN XI: Head turning and shoulder shrug are intact CN XII: Tongue is midline with normal movements and no atrophy.  MOTOR: Mild spastic left hemiparesis, left upper extremity proximal and distal 4/5, mild left hip flexion and left ankle dorsiflexion weakness  REFLEXES: Reflexes are 2+ and symmetric at the biceps, triceps, knees, and ankles. Plantar responses are flexor.  SENSORY: Intact to light touch, pinprick, position sense, and vibration sense are intact in fingers and toes.  COORDINATION: Rapid alternating movements and fine finger movements are intact. There is no dysmetria on finger-to-nose and heel-knee-shin.    GAIT/STANCE: Mildly unsteady gait, checking her left foot across the floor  Romberg is absent.   DIAGNOSTIC DATA (LABS, IMAGING, TESTING) - I reviewed patient records, labs, notes, testing and imaging myself where available.   ASSESSMENT AND PLAN  Rayley Gao is a 55 y.o. female   Chronic migraine headaches  Continue preventive medications, extended release topiramate 150 mg every day  Cambia, Fioricet, Compazine as needed,+ tizanidine prn  Continue magnesium oxide, riboflavin as preventive medications,  Add on nortriptyline 25 mg titrating to 50 every night as migraine prevention   Spastic left hemiparesis following right internal carotid artery dissection, right MCA stroke  Electrical stimulation guided Botox injection, used 300 units of Botox A  Left pronator teres 25 units Left flexor carpi ulnaris 25 units Left flexor digitorum profoundi 25 units Left flexor digitorum superficialis 25 units  Left pectoralis major 25 units Left teres major 25 units  Left tibialis  posterior 25 units Left flexor digitorum longus 25 unitsx2= 50 units  I also injected 75 units to   Frontalis 40 Procerus 10 Corrugate 10 Temporalis 15 units  Levert Feinstein, M.D. Ph.D.  [facility] [address], [address] [place], [place] [address] Ph: [phone] Fax: [phone]  CC: Referring Provider

## 2016-03-03 NOTE — Patient Instructions (Addendum)
You may take Cambia and, together with Fioricet, Compazine, and tizanidine as needed for severe protracted migraine  Continue Topamax ER 150 mg every night Plus new prescriptions nortriptyline 25 mg titrating to 2 tablets every night as migraine prevention

## 2016-03-03 NOTE — Progress Notes (Signed)
**  Botox 100 units x 3 vials, Lot [id number], Exp 08/2018, [facility] [id number], specialty pharmacy.//mck,rn**

## 2016-03-05 NOTE — Telephone Encounter (Signed)
Called to schedule patient she did not answer so I left a VM.

## 2016-04-13 ENCOUNTER — Encounter: Payer: Self-pay | Admitting: Internal Medicine

## 2016-04-13 ENCOUNTER — Ambulatory Visit (INDEPENDENT_AMBULATORY_CARE_PROVIDER_SITE_OTHER): Payer: BLUE CROSS/BLUE SHIELD | Admitting: Internal Medicine

## 2016-04-13 VITALS — BP 128/80 | HR 100 | Temp 97.8°F | Resp 16 | Ht 68.5 in | Wt 158.0 lb

## 2016-04-13 DIAGNOSIS — R300 Dysuria: Secondary | ICD-10-CM

## 2016-04-13 DIAGNOSIS — N390 Urinary tract infection, site not specified: Secondary | ICD-10-CM | POA: Diagnosis not present

## 2016-04-13 MED ORDER — AMPHETAMINE-DEXTROAMPHETAMINE 10 MG PO TABS: ORAL_TABLET | ORAL | 0 refills | Status: DC

## 2016-04-13 MED ORDER — CIPROFLOXACIN HCL 500 MG PO TABS: 500.0000 mg | ORAL_TABLET | Freq: Two times a day (BID) | ORAL | 0 refills | Status: AC

## 2016-04-13 NOTE — Progress Notes (Signed)
Assessment and Plan:   1. Dysuria  - ciprofloxacin (CIPRO) 500 MG tablet; Take 1 tablet (500 mg total) by mouth 2 (two) times daily.  Dispense: 20 tablet; Refill: 0 - Urinalysis, Routine w reflex microscopic - Culture, Urine  2.  ADD -adderall refilled  -[facility] database also checked and was not filled since 01/29/16.   -patient doing well will continue current therpay -also encouraged time off from medications   HPI 56 y.o.female presents for urinary frequency, urgency, and some dysuria.  She notes that over the weekend she started having some difficulty with nausea and fatigue.  She notes that after that she reports that she started taking azo.  She reports that this helped a little bit.  She reports that she had some diarrhea this morning.  She reports that she went to the movies.   She has not been taking tylenol, advil or ibuprofen.  She reports that she has not been taking the fioricet recently.  She has had no flank pain, fevers, chills, nausea, vomiting, or abdominal pain.  No vaginal discharge rash or vaginal bleeding.    She is also requesting a refill of her adderall.  She is taking this to help her with focus.  She reports that she is doing well with the medicaiton.  Last refill was in October 2016.    Past Medical History:  Diagnosis Date  . Anemia   . Anxiety   . Asthma   . Chronic heartburn   . Depression   . Elevated cholesterol    on Crestor  . Headache(784.0)   . Hypertension   . Hypothyroidism    S/P thyroidectomy  . Migraine   . Occlusion and stenosis of carotid artery with cerebral infarction   . Spasm of muscle   . Unspecified cerebral artery occlusion with cerebral infarction   . Vitamin D deficiency      Allergies  Allergen Reactions  . Penicillins Anaphylaxis and Hives  . Imitrex [Sumatriptan]   . Other Swelling    kiwi  . Sulfa Antibiotics Hives  . Zomig [Zolmitriptan]       Current Outpatient Prescriptions on File Prior to Visit   Medication Sig Dispense Refill  . ALPRAZolam (XANAX) 1 MG tablet TAKE 1/2 TO 1 TABLET BY MOUTH 3 TIMES A DAY AS NEEDED FOR ANXIETY 270 tablet 1  . aspirin 325 MG EC tablet Take 325 mg by mouth daily.    . Botulinum Toxin Type A (BOTOX) 200 UNITS SOLR Inject as directed. Change to BOTOX A 100  units    . buPROPion (WELLBUTRIN XL) 300 MG 24 hr tablet TAKE 1 TABLET (300 MG TOTAL) BY MOUTH DAILY. WITH FOOD 90 tablet 3  . butalbital-acetaminophen-caffeine (FIORICET, ESGIC) 50-325-40 MG tablet TAKE 1 TABLET BY MOUTH EVERY 6 HOURS AS NEEDED FOR HEADACHE 30 tablet 5  . cetirizine (ZYRTEC) 10 MG tablet Take 10 mg by mouth daily.    . Diclofenac Potassium (CAMBIA) 50 MG PACK Take one packet at onset of migraine.  May repeat once in two hours, if needed.  Max dose 2 packets per day.  #27/90days 27 each 3  . ESTRING 2 MG vaginal ring Place 2 mg vaginally every 3 (three) months.     . fenofibrate micronized (LOFIBRA) 134 MG capsule TAKE 1 CAPSULE BY MOUTH EVERY DAY 90 capsule 3  . fluticasone (FLONASE) 50 MCG/ACT nasal spray PLACE 2 SPRAYS INTO BOTH NOSTRILS DAILY. 48 g 1  . fluticasone furoate-vilanterol (BREO ELLIPTA) 100-25 MCG/INH  AEPB Inhale 1 puff into the lungs daily. Rinse mouth with water after each use 3 each 3  . HYDROcodone-acetaminophen (NORCO/VICODIN) 5-325 MG tablet Take 1 tablet by mouth every 4 (four) hours as needed for moderate pain. 30 tablet 0  . levothyroxine (SYNTHROID, LEVOTHROID) 100 MCG tablet Take 1 tablet (100 mcg total) by mouth daily. 90 tablet 3  . magnesium oxide (MAG-OX) 400 MG tablet Take 400 mg by mouth 2 (two) times daily.     . montelukast (SINGULAIR) 10 MG tablet Take 1 tablet (10 mg total) by mouth at bedtime. 90 tablet 3  . Multiple Vitamin (MULTIVITAMIN WITH MINERALS) TABS tablet Take 1 tablet by mouth daily.    . nortriptyline (PAMELOR) 25 MG capsule Take 2 capsules (50 mg total) by mouth at bedtime. 60 capsule 11  . omeprazole (PRILOSEC) 40 MG capsule TAKE 1 CAPSULE  (40 MG TOTAL) BY MOUTH DAILY. FOR ACID INDIGESTION AND REFLUX 90 capsule 3  . prochlorperazine (COMPAZINE) 5 MG tablet TAKE 1 TABLET BY MOUTH 3 TIMES A DAY AS NEEDED FOR NAUSEA 50 tablet 1  . PROVENTIL HFA 108 (90 BASE) MCG/ACT inhaler INHALE 2 PUFFS EVERY 4 HOURS AS NEEDED FOR ASTHMA RESCUE 6.7 Inhaler 0  . QUDEXY XR 150 MG CS24 TAKE 1 CAPSULE DAILY 90 each 1  . ranitidine (ZANTAC) 300 MG tablet Take 1 to 2 tablets daily for heartburn & reflux to allow wean and transition from PPI / Omeprazole 180 tablet 1  . Riboflavin 100 MG TABS Take 100 mg by mouth. bid    . tiZANidine (ZANAFLEX) 2 MG tablet Take 1 tablet (2 mg total) by mouth every 6 (six) hours as needed for muscle spasms. 30 tablet 6  . vitamin B-12 (CYANOCOBALAMIN) 1000 MCG tablet Take 1,000 mcg by mouth daily.    . Vitamin D, Ergocalciferol, (DRISDOL) 50000 units CAPS capsule TAKE 1 CAPSULE DAILY 30 capsule 1  . amphetamine-dextroamphetamine (ADDERALL) 10 MG tablet Take 1/2 to 1 tablet 1 or 2 x daily for ADD 60 tablet 0   No current facility-administered medications on file prior to visit.     ROS: all negative except above.   Physical Exam: Filed Weights   04/13/16 1004  Weight: 158 lb (71.7 kg)   BP 128/80   Pulse 100   Temp 97.8 F (36.6 C) (Temporal)   Resp 16   Ht 5' 8.5" (1.74 m)   Wt 158 lb (71.7 kg)   BMI 23.67 kg/m  General Appearance: Well developed well nourished, non-toxic appearing in no apparent distress. Eyes: PERRLA, EOMs, conjunctiva w/ no swelling or erythema or discharge Sinuses: No Frontal/maxillary tenderness ENT/Mouth: Ear canals clear without swelling or erythema.  TM's normal bilaterally with no retractions, bulging, or loss of landmarks.   Neck: Supple, thyroid normal, no notable JVD  Respiratory: Respiratory effort normal, Clear breath sounds anteriorly and posteriorly bilaterally without rales, rhonchi, wheezing or stridor. No retractions or accessory muscle usage. Cardio: RRR with no MRGs.    Abdomen: Soft, + BS.  Non tender, no guarding, rebound, hernias, masses.  Musculoskeletal: Full ROM, 5/5 strength, normal gait.  Skin: Warm, dry without rashes  Neuro: Awake and oriented X 3, Cranial nerves intact. Normal muscle tone, no cerebellar symptoms. Sensation intact.  Psych: normal affect, Insight and Judgment appropriate.     Terri Piedra, PA-C 10:29 AM [facility]

## 2016-04-20 ENCOUNTER — Other Ambulatory Visit: Payer: Self-pay

## 2016-04-21 LAB — URINALYSIS, ROUTINE W REFLEX MICROSCOPIC
Bilirubin Urine: NEGATIVE
Glucose, UA: NEGATIVE
Ketones, ur: NEGATIVE
Leukocytes, UA: NEGATIVE
Nitrite: POSITIVE — AB
Protein, ur: NEGATIVE
Specific Gravity, Urine: 1.019 (ref 1.001–1.035)
pH: 6.5 (ref 5.0–8.0)

## 2016-04-21 LAB — URINALYSIS, MICROSCOPIC ONLY
Casts: NONE SEEN [LPF]
RBC / HPF: NONE SEEN RBC/HPF (ref <=2)
WBC, UA: NONE SEEN WBC/HPF (ref <=5)
Yeast: NONE SEEN [HPF]

## 2016-04-21 LAB — URINE CULTURE

## 2016-04-22 ENCOUNTER — Encounter: Payer: Self-pay | Admitting: Internal Medicine

## 2016-04-26 ENCOUNTER — Telehealth: Payer: Self-pay | Admitting: Neurology

## 2016-04-26 ENCOUNTER — Other Ambulatory Visit: Payer: Self-pay | Admitting: *Deleted

## 2016-04-26 DIAGNOSIS — G43709 Chronic migraine without aura, not intractable, without status migrainosus: Secondary | ICD-10-CM

## 2016-04-26 DIAGNOSIS — IMO0002 Reserved for concepts with insufficient information to code with codable children: Secondary | ICD-10-CM

## 2016-04-26 MED ORDER — HYDROCODONE-ACETAMINOPHEN 5-325 MG PO TABS: 1.0000 | ORAL_TABLET | ORAL | 0 refills | Status: DC | PRN

## 2016-04-26 NOTE — Telephone Encounter (Signed)
Refill printed - waiting for MD signature.

## 2016-04-26 NOTE — Telephone Encounter (Signed)
Patient called requesting refill for HYDROcodone-acetaminophen (NORCO/VICODIN) 5-325 MG tablet °

## 2016-04-26 NOTE — Telephone Encounter (Signed)
Prescription signed and placed up front for pick up. 

## 2016-04-26 NOTE — Telephone Encounter (Signed)
Patient called in reference to scheduling her BOTOX.  Please call

## 2016-05-05 NOTE — Telephone Encounter (Signed)
Called and spoke with the patient. We scheduled her next injection.

## 2016-05-06 ENCOUNTER — Encounter: Payer: Self-pay | Admitting: Internal Medicine

## 2016-05-07 DIAGNOSIS — H1851 Endothelial corneal dystrophy: Secondary | ICD-10-CM | POA: Diagnosis not present

## 2016-05-18 ENCOUNTER — Ambulatory Visit (INDEPENDENT_AMBULATORY_CARE_PROVIDER_SITE_OTHER): Payer: BLUE CROSS/BLUE SHIELD | Admitting: Internal Medicine

## 2016-05-18 ENCOUNTER — Encounter: Payer: Self-pay | Admitting: Internal Medicine

## 2016-05-18 VITALS — BP 130/84 | HR 92 | Temp 97.3°F | Resp 16 | Ht 68.75 in | Wt 156.6 lb

## 2016-05-18 DIAGNOSIS — R5383 Other fatigue: Secondary | ICD-10-CM

## 2016-05-18 DIAGNOSIS — Z1211 Encounter for screening for malignant neoplasm of colon: Secondary | ICD-10-CM

## 2016-05-18 DIAGNOSIS — Z79899 Other long term (current) drug therapy: Secondary | ICD-10-CM | POA: Diagnosis not present

## 2016-05-18 DIAGNOSIS — Z23 Encounter for immunization: Secondary | ICD-10-CM | POA: Diagnosis not present

## 2016-05-18 DIAGNOSIS — Z0000 Encounter for general adult medical examination without abnormal findings: Secondary | ICD-10-CM

## 2016-05-18 DIAGNOSIS — G43809 Other migraine, not intractable, without status migrainosus: Secondary | ICD-10-CM

## 2016-05-18 DIAGNOSIS — Z0001 Encounter for general adult medical examination with abnormal findings: Secondary | ICD-10-CM

## 2016-05-18 DIAGNOSIS — Z136 Encounter for screening for cardiovascular disorders: Secondary | ICD-10-CM

## 2016-05-18 DIAGNOSIS — Z111 Encounter for screening for respiratory tuberculosis: Secondary | ICD-10-CM

## 2016-05-18 DIAGNOSIS — K219 Gastro-esophageal reflux disease without esophagitis: Secondary | ICD-10-CM

## 2016-05-18 DIAGNOSIS — I679 Cerebrovascular disease, unspecified: Secondary | ICD-10-CM

## 2016-05-18 DIAGNOSIS — I63031 Cerebral infarction due to thrombosis of right carotid artery: Secondary | ICD-10-CM

## 2016-05-18 DIAGNOSIS — E559 Vitamin D deficiency, unspecified: Secondary | ICD-10-CM | POA: Diagnosis not present

## 2016-05-18 DIAGNOSIS — R030 Elevated blood-pressure reading, without diagnosis of hypertension: Secondary | ICD-10-CM

## 2016-05-18 DIAGNOSIS — E782 Mixed hyperlipidemia: Secondary | ICD-10-CM

## 2016-05-18 DIAGNOSIS — R7303 Prediabetes: Secondary | ICD-10-CM

## 2016-05-18 DIAGNOSIS — E890 Postprocedural hypothyroidism: Secondary | ICD-10-CM

## 2016-05-18 DIAGNOSIS — G8114 Spastic hemiplegia affecting left nondominant side: Secondary | ICD-10-CM

## 2016-05-18 DIAGNOSIS — Z1212 Encounter for screening for malignant neoplasm of rectum: Secondary | ICD-10-CM

## 2016-05-18 LAB — CBC WITH DIFFERENTIAL/PLATELET
Basophils Absolute: 42 cells/uL (ref 0–200)
Basophils Relative: 1 %
Eosinophils Absolute: 210 cells/uL (ref 15–500)
Eosinophils Relative: 5 %
HCT: 42 % (ref 35.0–45.0)
Hemoglobin: 14.1 g/dL (ref 11.7–15.5)
Lymphocytes Relative: 45 %
Lymphs Abs: 1890 cells/uL (ref 850–3900)
MCH: 31.4 pg (ref 27.0–33.0)
MCHC: 33.6 g/dL (ref 32.0–36.0)
MCV: 93.5 fL (ref 80.0–100.0)
MPV: 10.7 fL (ref 7.5–12.5)
Monocytes Absolute: 378 cells/uL (ref 200–950)
Monocytes Relative: 9 %
Neutro Abs: 1680 cells/uL (ref 1500–7800)
Neutrophils Relative %: 40 %
Platelets: 192 10*3/uL (ref 140–400)
RBC: 4.49 MIL/uL (ref 3.80–5.10)
RDW: 13.5 % (ref 11.0–15.0)
WBC: 4.2 10*3/uL (ref 3.8–10.8)

## 2016-05-18 LAB — HEPATIC FUNCTION PANEL
ALT: 26 U/L (ref 6–29)
AST: 28 U/L (ref 10–35)
Albumin: 4.3 g/dL (ref 3.6–5.1)
Alkaline Phosphatase: 63 U/L (ref 33–130)
Bilirubin, Direct: 0.1 mg/dL (ref <=0.2)
Indirect Bilirubin: 0.5 mg/dL (ref 0.2–1.2)
Total Bilirubin: 0.6 mg/dL (ref 0.2–1.2)
Total Protein: 6.7 g/dL (ref 6.1–8.1)

## 2016-05-18 LAB — BASIC METABOLIC PANEL WITH GFR
BUN: 16 mg/dL (ref 7–25)
CO2: 23 mmol/L (ref 20–31)
Calcium: 9.3 mg/dL (ref 8.6–10.4)
Chloride: 111 mmol/L — ABNORMAL HIGH (ref 98–110)
Creat: 1.13 mg/dL — ABNORMAL HIGH (ref 0.50–1.05)
GFR, Est African American: 62 mL/min (ref >=60)
GFR, Est Non African American: 54 mL/min — ABNORMAL LOW (ref >=60)
Glucose, Bld: 89 mg/dL (ref 65–99)
Potassium: 4.2 mmol/L (ref 3.5–5.3)
Sodium: 141 mmol/L (ref 135–146)

## 2016-05-18 LAB — LIPID PANEL
Cholesterol: 162 mg/dL (ref <200)
HDL: 59 mg/dL (ref >50)
LDL Cholesterol: 76 mg/dL (ref <100)
Total CHOL/HDL Ratio: 2.7 Ratio (ref <5.0)
Triglycerides: 136 mg/dL (ref <150)
VLDL: 27 mg/dL (ref <30)

## 2016-05-18 LAB — IRON AND TIBC
%SAT: 48 % (ref 11–50)
Iron: 133 ug/dL (ref 45–160)
TIBC: 277 ug/dL (ref 250–450)
UIBC: 144 ug/dL (ref 125–400)

## 2016-05-18 LAB — TSH: TSH: 0.35 mIU/L — ABNORMAL LOW

## 2016-05-18 LAB — VITAMIN B12: Vitamin B-12: 695 pg/mL (ref 200–1100)

## 2016-05-18 NOTE — Progress Notes (Signed)
[facility] Lucky Cowboy, M.D.    Dyanne Carrel. Steffanie Dunn, P.A.-C      Terri Piedra, P.A.-C  [facility]                [address]                [place], [place]. [address] Telephone [phone] Telefax [phone]  Annual Screening/Preventative Visit & Comprehensive Evaluation &  Examination     This very nice 56 y.o. MWF presents for a Screening/Preventative Visit & comprehensive evaluation and management of multiple medical co-morbidities.  Patient has been followed for HTN, Prediabetes, Hyperlipidemia, Hypothyroidism  and Vitamin D Deficiency.     In 2009, patient  a R MCA CVA  Attributed to dissection of fibromuscular dysplasia & dresidua residua of a mild spastic L HP and is followed by Dr Terrace Arabia, Neurology for Botox injections in the LUE. She has residual Cognitive dysfunction from the CVA. Patient does take Adderall and reports improved cognitive function and ability to focus.  She is also treated for chronic recurrent sinusitis and Migraine HA.       Patient has hx/o labile HTN and is monitored expectantly.  Patient's BP has been controlled at home and patient denies any cardiac symptoms as chest pain, palpitations, shortness of breath, dizziness or ankle swelling. Today's BP: is at goal -130/84.      Patient is on thyroid replacemnt since thyroid surgery in 2007. Patient's hyperlipidemia is controlled with diet and medications. Patient denies myalgias or other medication SE's. Last lipids were not at goal: Lab Results  Component Value Date   CHOL 215 (H) 02/09/2016   HDL 68 02/09/2016   LDLCALC 119 (H) 02/09/2016   TRIG 142 02/09/2016   CHOLHDL 3.2 02/09/2016      Patient is monitored proactively for prediabetes (A1c 5.8% in 2008) and patient denies reactive hypoglycemic symptoms, visual blurring, diabetic polys, or paresthesias. Last A1c was normal and at goal: Lab Results  Component Value Date   HGBA1C  5.4 02/09/2016      Finally, patient has history of Vitamin D Deficiency ("23" in 2008) and last Vitamin D was near goal (70-100): Lab Results  Component Value Date   VD25OH 54 02/09/2016   Current Outpatient Prescriptions on File Prior to Visit  Medication Sig  . ALPRAZolam1 MG tablet TAKE 1/2 TO 1 TABL 3 TIMES A DAY AS NEEDED  . BOTOX 200 UNITS SOLR Inject as directed. Change to BOTOX A 100  units  . buPROPion-XL 300 MG  TAKE 1 TAB DAILY. WITH FOOD  . FIORICET TAKE 1 TAB EVERY 6 HRS AS NEEDED   . cetirizine  10 MG tablet Take  daily.  . Diclofenac Potassium (CAMBIA) 50 MG PACK Take one packet at onset of migraine.  May repeat once in two hours, if needed.   Marland Kitchen ESTRING 2 MG vaginal ring Place 2 mg vaginally every 3 months.   . fenofibrate  134 MG cap TAKE 1 CAPS EVERY DAY  . BREO ELLIPTA 100-25 1 puff into the lungs daily  . NORCO 5-325 MG  Take 1 tab every 4  hours as needed for moderate pain.  Marland Kitchen levothyroxine  100 MCG  Take 1 tab daily.  . magnesium  400 MG  Take  2  times daily.   . Montelukast 10 MG  Take 1 tab at bedtime.  . Multi-Vit w/Min Take 1 tab daily.  . nortriptyline  25 MG  Take 2 caps at bedtime.  . prochlorperazine  5 MG  TAKE 1 TAB 3 TIMES A DAY FOR NAUSEA  . PROVENTIL HFA  inhaler 2 PUFFS EVERY 4 HOURS AS NEEDED FOR ASTHMA RESCUE  . QUDEXY XR 150 MG CS24 TAKE 1 CAP DAILY  . Riboflavin 100 MG  Takebid  . tiZANidine  2 MG  Take 1 tab every 6  hours as needed for muscle spasms.  . Vitamin D  50,00 units  TAKE 1 CAPSULE DAILY  . ADDERALL 10 MG Take 1/2 to 1 tab 1 or 2 x daily for ADD  . aspirin 325 MG EC Take  daily.  . ranitidine  300 MG  Take 1 to 2 tab daily for heartburn & reflux  . vitamin B-12 1000 MCG  Take 1,000 mcg  daily.    Allergies  Allergen Reactions  . Penicillins Anaphylaxis and Hives  . Imitrex [Sumatriptan]   . Other Swelling    kiwi  . Sulfa Antibiotics Hives  . Zomig [Zolmitriptan]    Past Medical History:  Diagnosis Date  . Anemia   .  Anxiety   . Asthma   . Chronic heartburn   . Depression   . Elevated cholesterol    on Crestor  . Headache(784.0)   . Hypertension   . Hypothyroidism    S/P thyroidectomy  . Migraine   . Occlusion and stenosis of carotid artery with cerebral infarction   . Spasm of muscle   . Unspecified cerebral artery occlusion with cerebral infarction   . Vitamin D deficiency    Health Maintenance  Topic Date Due  . COLONOSCOPY  11/02/2008  . TETANUS/TDAP  03/29/2012  . MAMMOGRAM  03/19/2017  . PAP SMEAR  05/07/2017  . INFLUENZA VACCINE  Completed  . Hepatitis C Screening  Completed  . HIV Screening  Completed   Immunization History  Administered Date(s) Administered  . Influenza Split 01/24/2014  . Influenza, Seasonal, Injecte, Preservative Fre 02/09/2016  . Influenza-Unspecified 11/28/2011  . PPD Test 04/04/2013, 04/22/2015, 05/18/2016  . Pneumococcal Polysaccharide-23 03/29/1998  . Td 03/29/2002  . Tdap 05/18/2016   Past Surgical History:  Procedure Laterality Date  . COSMETIC SURGERY    . CYSTOCELE REPAIR N/A 01/08/2013   Procedure: ANTERIOR REPAIR (CYSTOCELE);  Surgeon: Meriel Pica, MD;  Location: [facility];  Service: Gynecology;  Laterality: N/A;  . NASAL SINUS SURGERY    . VAGINAL HYSTERECTOMY     Family History  Problem Relation Age of Onset  . Heart disease Mother   . Peptic Ulcer Disease Mother   . Heart disease Father   . Parkinson's disease Father    Social History  Substance Use Topics  . Smoking status: Never Smoker  . Smokeless tobacco: Never Used  . Alcohol use 0.0 oz/week     Comment: occassionally drinks a glass of wine    ROS Constitutional: Denies fever, chills, weight loss/gain, headaches, insomnia,  night sweats, and change in appetite. Does c/o fatigue. Eyes: Denies redness, blurred vision, diplopia, discharge, itchy, watery eyes.  ENT: Denies discharge, congestion, post nasal drip, epistaxis, sore throat, earache, hearing loss, dental pain,  Tinnitus, Vertigo, Sinus pain, snoring.  Cardio: Denies chest pain, palpitations, irregular heartbeat, syncope, dyspnea, diaphoresis, orthopnea, PND, claudication, edema Respiratory: denies cough, dyspnea, DOE, pleurisy, hoarseness, laryngitis, wheezing.  Gastrointestinal: Denies dysphagia, heartburn, reflux, water brash, pain, cramps, nausea, vomiting, bloating, diarrhea, constipation, hematemesis, melena, hematochezia, jaundice, hemorrhoids Genitourinary: Denies dysuria, frequency, urgency, nocturia, hesitancy, discharge, hematuria, flank pain  Breast: Breast lumps, nipple discharge, bleeding.  Musculoskeletal: Denies arthralgia, myalgia, stiffness, Jt. Swelling, pain, limp, and strain/sprain. Denies falls. Skin: Denies puritis, rash, hives, warts, acne, eczema, changing in skin lesion Neuro: No weakness, tremor, incoordination, spasms, paresthesia, pain Psychiatric: Denies confusion, memory loss, sensory loss. Denies Depression. Endocrine: Denies change in weight, skin, hair change, nocturia, and paresthesia, diabetic polys, visual blurring, hyper / hypo glycemic episodes.  Heme/Lymph: No excessive bleeding, bruising, enlarged lymph nodes.  Physical Exam  BP 130/84   Pulse 92   Temp 97.3 F (36.3 C)   Resp 16   Ht 5' 8.75" (1.746 m)   Wt 156 lb 9.6 oz (71 kg)   BMI 23.29 kg/m   General Appearance: Well nourished and in no apparent distress.  Eyes: PERRLA, EOMs, conjunctiva no swelling or erythema, normal fundi and vessels. Sinuses: No frontal/maxillary tenderness ENT/Mouth: EACs patent / TMs  nl. Nares clear without erythema, swelling, mucoid exudates. Oral hygiene is good. No erythema, swelling, or exudate. Tongue normal, non-obstructing. Tonsils not swollen or erythematous. Hearing normal.  Neck: Supple, thyroid normal. No bruits, nodes or JVD. Respiratory: Respiratory effort normal.  BS equal and clear bilateral without rales, rhonci, wheezing or stridor. Cardio: Heart sounds are  normal with regular rate and rhythm and no murmurs, rubs or gallops. Peripheral pulses are normal and equal bilaterally without edema. No aortic or femoral bruits. Chest: symmetric with normal excursions and percussion. Breasts: Symmetric, without lumps, nipple discharge, retractions, or fibrocystic changes.  Abdomen: Flat, soft with bowel sounds active. Nontender, no guarding, rebound, hernias, masses, or organomegaly.  Lymphatics: Non tender without lymphadenopathy.  Genitourinary:  Musculoskeletal:  Mild spastic LUE paresis with flexion contracturing at the wrist and Left sided hyperreflexia.  Normal gait. Skin: Warm and dry without rashes, lesions, cyanosis, clubbing or  ecchymosis.  Neuro: Cranial nerves intact, reflexes equal bilaterally. Normal muscle tone, no cerebellar symptoms. Sensation intact.  Pysch: Alert and oriented X 3, normal affect, Insight and Judgment appropriate.   Assessment and Plan  1. Annual Preventative Screening Examination  2. Elevated blood pressure reading without diagnosis of hypertension  - Microalbumin / creatinine urine ratio - EKG 12-Lead - US, RETROPERITNL ABD,  LTD - Urinalysis, Routine w reflex microscopic - CBC with Differential/Platelet - BASIC METABOLIC PANEL WITH GFR - TSH  3. Mixed hyperlipidemia  - EKG 12-Lead - US, RETROPERITNL ABD,  LTD - Hepatic function panel - Lipid panel - TSH  4. Prediabetes  - EKG 12-Lead - US, RETROPERITNL ABD,  LTD - Hemoglobin A1c - Insulin, random  5. Vitamin D deficiency  - VITAMIN D 25 Hydroxy   6. Gastroesophageal reflux disease   7. Cerebrovascular accident (CVA) due to thrombosis of right carotid artery (HCC)   8. Spastic hemiplegia of left nondominant side due to cerebrovascular disease  (HCC)   9. Postoperative hypothyroidism  - TSH  10. Migraine    11. Screening for ischemic heart disease  - EKG 12-Lead  12. Screening for AAA (aortic abdominal aneurysm)  - US,  RETROPERITNL ABD,  LTD  13. Screening for colorectal cancer  - POC Hemoccult Bld/Stl  14. Fatigue, unspecified type  - Vitamin B12 - Iron and TIBC - CBC with Differential/Platelet - TSH  15. Medication management  - Urinalysis, Routine w reflex microscopic - CBC with Differential/Platelet - BASIC METABOLIC PANEL WITH GFR - Hepatic function panel - Magnesium - Lipid panel - TSH - Hemoglobin A1c - Insulin, random - VITAMIN D 25 Hydroxy   16. Screening  examination for pulmonary tuberculosis  - PPD  17. Need for prophylactic vaccination with combined diphtheria-tetanus-pertussis (DTP) vaccine  - Tdap vaccine        Continue prudent diet as discussed, weight control, BP monitoring, regular exercise, and medications. Discussed med's effects and SE's. Screening labs and tests as requested with regular follow-up as recommended. Over 40 minutes of exam, counseling, chart review and high complex critical decision making was performed.

## 2016-05-18 NOTE — Patient Instructions (Signed)

## 2016-05-19 LAB — MICROALBUMIN / CREATININE URINE RATIO
Creatinine, Urine: 124 mg/dL (ref 20–320)
Microalb Creat Ratio: 2 mcg/mg creat (ref <30)
Microalb, Ur: 0.2 mg/dL

## 2016-05-19 LAB — URINALYSIS, ROUTINE W REFLEX MICROSCOPIC
Bilirubin Urine: NEGATIVE
Glucose, UA: NEGATIVE
Hgb urine dipstick: NEGATIVE
Ketones, ur: NEGATIVE
Leukocytes, UA: NEGATIVE
Nitrite: NEGATIVE
Protein, ur: NEGATIVE
Specific Gravity, Urine: 1.016 (ref 1.001–1.035)
pH: 7 (ref 5.0–8.0)

## 2016-05-19 LAB — INSULIN, RANDOM: Insulin: 7 u[IU]/mL (ref 2.0–19.6)

## 2016-05-19 LAB — HEMOGLOBIN A1C
Hgb A1c MFr Bld: 5.3 % (ref <5.7)
Mean Plasma Glucose: 105 mg/dL

## 2016-05-19 LAB — MAGNESIUM: Magnesium: 2.1 mg/dL (ref 1.5–2.5)

## 2016-05-19 LAB — VITAMIN D 25 HYDROXY (VIT D DEFICIENCY, FRACTURES): Vit D, 25-Hydroxy: 56 ng/mL (ref 30–100)

## 2016-05-20 ENCOUNTER — Encounter: Payer: Self-pay | Admitting: Internal Medicine

## 2016-05-24 ENCOUNTER — Encounter (INDEPENDENT_AMBULATORY_CARE_PROVIDER_SITE_OTHER): Payer: Self-pay | Admitting: Orthopedic Surgery

## 2016-05-24 ENCOUNTER — Ambulatory Visit (INDEPENDENT_AMBULATORY_CARE_PROVIDER_SITE_OTHER): Payer: Self-pay

## 2016-05-24 ENCOUNTER — Ambulatory Visit (INDEPENDENT_AMBULATORY_CARE_PROVIDER_SITE_OTHER): Payer: BLUE CROSS/BLUE SHIELD | Admitting: Orthopedic Surgery

## 2016-05-24 DIAGNOSIS — M25561 Pain in right knee: Secondary | ICD-10-CM | POA: Diagnosis not present

## 2016-05-24 NOTE — Progress Notes (Signed)
Office Visit Note   Patient: Ruth Gregory           Date of Birth: 11-Feb-1961           MRN: [id number] Visit Date: 05/24/2016 Requested by: Lucky Cowboy, MD [address] [place], [place] [address] PCP: Nadean Corwin, MD  Subjective: Chief Complaint  Patient presents with  . Right Knee - Pain    HPI Keyonni is a 56 year old patient with right knee pain.  Been going on for 2 weeks with noted history of injury.  She does have a history of having a massive stroke in 2008 from migraines.  Describes anterior knee pain on the right-hand side.  She feels like it hyperextends when she is walking sometimes.  She takes Botox on the left side of her body for spasticity.  She is hot baths and ibuprofen which helped.              Review of Systems All systems reviewed are negative as they relate to the chief complaint within the history of present illness.  Patient denies  fevers or chills.    Assessment & Plan: Visit Diagnoses:  1. Acute pain of right knee     Plan: Impression is right knee pain with normal radiographs.  Most of it is anterior knee pain.  I like to try an injection in the knee today 4 week return to decide whether or not we need to proceed with MRI scanning.  No effusion today.  I think she may just have inflamed patellofemoral tissue that had in the front part of her knee.  We'll see her back in 4 weeks for clinical recheck decision in for or against MRI scanning and to see how she did with the injection  Follow-Up Instructions: No Follow-up on file.   Orders:  Orders Placed This Encounter  Procedures  . XR Knee 1-2 Views Right   No orders of the defined types were placed in this encounter.     Procedures: Large Joint Inj Date/Time: 05/26/2016 12:42 PM Performed by: Cammy Copa Authorized by: Cammy Copa   Consent Given by:  Patient Site marked: the procedure site was marked   Timeout: prior to procedure the correct patient,  procedure, and site was verified   Indications:  Pain, joint swelling and diagnostic evaluation Location:  Knee Site:  R knee Prep: patient was prepped and draped in usual sterile fashion   Needle Size:  18 G Needle Length:  1.5 inches Approach:  Superolateral Ultrasound Guidance: No   Fluoroscopic Guidance: No   Arthrogram: No   Medications:  5 mL lidocaine 1 %; 4 mL bupivacaine 0.25 %; 40 mg methylPREDNISolone acetate 40 MG/ML Patient tolerance:  Patient tolerated the procedure well with no immediate complications     Clinical Data: No additional findings.  Objective: Vital Signs: There were no vitals taken for this visit.  Physical Exam   Constitutional: Patient appears well-developed HEENT:  Head: Normocephalic Eyes:EOM are normal Neck: Normal range of motion Cardiovascular: Normal rate Pulmonary/chest: Effort normal Neurologic: Patient is alert Skin: Skin is warm Psychiatric: Patient has normal mood and affect    Ortho Exam examination of the right knee demonstrates full active and passive range of motion no effusion, patellofemoral crepitus stable, crucial ligaments palpable pedal pulses no groin pain interlocks rotation leg no other masses lymph and up your skin changes noted in the right knee region.  Extensor mechanism is intact.  Specialty Comments:  No  specialty comments available.  Imaging: Xr Knee 1-2 Views Right  Result Date: 05/24/2016 AP lateral right knee Reviewed right knee.  Minimal degenerative changes present.  Quality normal.  Patella located and in normal position.  Trace effusion present.    PMFS History: Patient Active Problem List   Diagnosis Date Noted  . Acute pain of right knee 05/24/2016  . Hypothyroidism 07/23/2015  . Fatigue 04/08/2014  . Elevated blood pressure reading without diagnosis of hypertension 10/03/2013  . Vitamin D deficiency 10/03/2013  . Medication management 10/03/2013  . Prediabetes 10/03/2013  . ADD  (attention deficit disorder) 10/03/2013  . Mixed hyperlipidemia 04/04/2013  . Sinusitis, chronic 04/04/2013  . Migraine 09/06/2012  . Spastic hemiplegia affecting left nondominant side (HCC) 09/06/2012  . CVA (cerebral vascular accident) [facility](HCC)    Past Medical History:  Diagnosis Date  . Anemia   . Anxiety   . Asthma   . Chronic heartburn   . Depression   . Elevated cholesterol    on Crestor  . Headache(784.0)   . Hypertension   . Hypothyroidism    S/P thyroidectomy  . Migraine   . Occlusion and stenosis of carotid artery with cerebral infarction   . Spasm of muscle   . Unspecified cerebral artery occlusion with cerebral infarction   . Vitamin D deficiency     Family History  Problem Relation Age of Onset  . Heart disease Mother   . Peptic Ulcer Disease Mother   . Heart disease Father   . Parkinson's disease Father     Past Surgical History:  Procedure Laterality Date  . COSMETIC SURGERY    . CYSTOCELE REPAIR N/A 01/08/2013   Procedure: ANTERIOR REPAIR (CYSTOCELE);  Surgeon: Meriel Picaichard M Holland, MD;  Location: [facility];  Service: Gynecology;  Laterality: N/A;  . NASAL SINUS SURGERY    . VAGINAL HYSTERECTOMY     Social History   Occupational History  . DESIGNER Self   Social History Main Topics  . Smoking status: Never Smoker  . Smokeless tobacco: Never Used  . Alcohol use 0.0 oz/week     Comment: occassionally drinks a glass of wine  . Drug use: No  . Sexual activity: Not on file

## 2016-05-26 DIAGNOSIS — M25561 Pain in right knee: Secondary | ICD-10-CM

## 2016-05-26 MED ORDER — LIDOCAINE HCL 1 % IJ SOLN
5.0000 mL | INTRAMUSCULAR | Status: AC | PRN
  Administered 2016-05-26: 5 mL

## 2016-05-26 MED ORDER — BUPIVACAINE HCL 0.25 % IJ SOLN
4.0000 mL | INTRAMUSCULAR | Status: AC | PRN
  Administered 2016-05-26: 4 mL via INTRA_ARTICULAR

## 2016-05-26 MED ORDER — METHYLPREDNISOLONE ACETATE 40 MG/ML IJ SUSP
40.0000 mg | INTRAMUSCULAR | Status: AC | PRN
  Administered 2016-05-26: 40 mg via INTRA_ARTICULAR

## 2016-06-02 ENCOUNTER — Other Ambulatory Visit: Payer: Self-pay | Admitting: Internal Medicine

## 2016-06-02 DIAGNOSIS — J069 Acute upper respiratory infection, unspecified: Secondary | ICD-10-CM

## 2016-06-21 ENCOUNTER — Ambulatory Visit (INDEPENDENT_AMBULATORY_CARE_PROVIDER_SITE_OTHER): Payer: BLUE CROSS/BLUE SHIELD | Admitting: Orthopedic Surgery

## 2016-06-21 ENCOUNTER — Encounter (INDEPENDENT_AMBULATORY_CARE_PROVIDER_SITE_OTHER): Payer: Self-pay | Admitting: Orthopedic Surgery

## 2016-06-21 DIAGNOSIS — M25561 Pain in right knee: Secondary | ICD-10-CM

## 2016-06-21 NOTE — Progress Notes (Signed)
Office Visit Note   Patient: Ruth Gregory           Date of Birth: 18-May-1960           MRN: [id number] Visit Date: 06/21/2016 Requested by: Lucky Cowboy, MD [address] [place], [place] [address] PCP: Nadean Corwin, MD  Subjective: Chief Complaint  Patient presents with  . Right Knee - Follow-up    HPI: Ruth Gregory is a 56 year old patient with right knee pain.  She had an injection last clinic visit which gave significant relief.  She gives a she has plateaued in terms of improvement from that injection.  In her knees are bent she has some pain.  Ibuprofen helps.  She does describe pain with prolonged sitting.              ROS: All systems reviewed are negative as they relate to the chief complaint within the history of present illness.  Patient denies  fevers or chills.   Assessment & Plan: Visit Diagnoses:  1. Right knee pain, unspecified chronicity     Plan: Impression is right knee pain.  Plan is for repeat cortisone injection if the pain comes back.  Our decision point today was for or against MRI imaging.  I think at this time with more patellofemoral crepitus on the right and left and her anterior knee pain that it's unlikely that anything surgical could be found in that knee which would affect improvement in her symptoms.  She is thin.  Quad strengthening encouraged.  Again we could consider imaging as a last resort but I would probably favor a repeat injection prior to doing that.  I'll see her back as needed  Follow-Up Instructions: No Follow-up on file.   Orders:  No orders of the defined types were placed in this encounter.  No orders of the defined types were placed in this encounter.     Procedures: No procedures performed   Clinical Data: No additional findings.  Objective: Vital Signs: There were no vitals taken for this visit.  Physical Exam:   Constitutional: Patient appears well-developed HEENT:  Head: Normocephalic Eyes:EOM  are normal Neck: Normal range of motion Cardiovascular: Normal rate Pulmonary/chest: Effort normal Neurologic: Patient is alert Skin: Skin is warm Psychiatric: Patient has normal mood and affect    Ortho Exam: Orthopedic exam demonstrates full active and passive range of motion of the right knee she has more patellofemoral crepitus on the right than the left stable) crucial ligaments no effusion no focal extension mechanism tenderness.  Pedal pulses palpable.  Specialty Comments:  No specialty comments available.  Imaging: No results found.   PMFS History: Patient Active Problem List   Diagnosis Date Noted  . Acute pain of right knee 05/24/2016  . Hypothyroidism 07/23/2015  . Fatigue 04/08/2014  . Elevated blood pressure reading without diagnosis of hypertension 10/03/2013  . Vitamin D deficiency 10/03/2013  . Medication management 10/03/2013  . Prediabetes 10/03/2013  . ADD (attention deficit disorder) 10/03/2013  . Mixed hyperlipidemia 04/04/2013  . Sinusitis, chronic 04/04/2013  . Migraine 09/06/2012  . Spastic hemiplegia affecting left nondominant side (HCC) 09/06/2012  . CVA (cerebral vascular accident) [facility])    Past Medical History:  Diagnosis Date  . Anemia   . Anxiety   . Asthma   . Chronic heartburn   . Depression   . Elevated cholesterol    on Crestor  . Headache(784.0)   . Hypertension   . Hypothyroidism    S/P thyroidectomy  .  Migraine   . Occlusion and stenosis of carotid artery with cerebral infarction   . Spasm of muscle   . Unspecified cerebral artery occlusion with cerebral infarction   . Vitamin D deficiency     Family History  Problem Relation Age of Onset  . Heart disease Mother   . Peptic Ulcer Disease Mother   . Heart disease Father   . Parkinson's disease Father     Past Surgical History:  Procedure Laterality Date  . COSMETIC SURGERY    . CYSTOCELE REPAIR N/A 01/08/2013   Procedure: ANTERIOR REPAIR (CYSTOCELE);  Surgeon:  Meriel Picaichard M Holland, MD;  Location: [facility];  Service: Gynecology;  Laterality: N/A;  . NASAL SINUS SURGERY    . VAGINAL HYSTERECTOMY     Social History   Occupational History  . DESIGNER Self   Social History Main Topics  . Smoking status: Never Smoker  . Smokeless tobacco: Never Used  . Alcohol use 0.0 oz/week     Comment: occassionally drinks a glass of wine  . Drug use: No  . Sexual activity: Not on file

## 2016-06-22 ENCOUNTER — Other Ambulatory Visit: Payer: Self-pay | Admitting: Physician Assistant

## 2016-06-22 DIAGNOSIS — K219 Gastro-esophageal reflux disease without esophagitis: Secondary | ICD-10-CM

## 2016-06-22 MED ORDER — RANITIDINE HCL 300 MG PO TABS: ORAL_TABLET | ORAL | 1 refills | Status: DC

## 2016-06-22 MED ORDER — AMPHETAMINE-DEXTROAMPHETAMINE 10 MG PO TABS: ORAL_TABLET | ORAL | 0 refills | Status: DC

## 2016-06-23 ENCOUNTER — Other Ambulatory Visit: Payer: Self-pay | Admitting: Internal Medicine

## 2016-06-23 ENCOUNTER — Other Ambulatory Visit: Payer: Self-pay | Admitting: Neurology

## 2016-06-23 DIAGNOSIS — F411 Generalized anxiety disorder: Secondary | ICD-10-CM

## 2016-06-28 ENCOUNTER — Telehealth: Payer: Self-pay | Admitting: Neurology

## 2016-06-30 ENCOUNTER — Ambulatory Visit (INDEPENDENT_AMBULATORY_CARE_PROVIDER_SITE_OTHER): Payer: BLUE CROSS/BLUE SHIELD | Admitting: Neurology

## 2016-06-30 ENCOUNTER — Encounter: Payer: Self-pay | Admitting: Neurology

## 2016-06-30 VITALS — BP 138/82 | HR 96 | Ht 68.75 in | Wt 156.2 lb

## 2016-06-30 DIAGNOSIS — I63031 Cerebral infarction due to thrombosis of right carotid artery: Secondary | ICD-10-CM

## 2016-06-30 DIAGNOSIS — G8114 Spastic hemiplegia affecting left nondominant side: Secondary | ICD-10-CM

## 2016-06-30 DIAGNOSIS — G43019 Migraine without aura, intractable, without status migrainosus: Secondary | ICD-10-CM

## 2016-06-30 DIAGNOSIS — I679 Cerebrovascular disease, unspecified: Secondary | ICD-10-CM

## 2016-06-30 NOTE — Progress Notes (Signed)
**  Botox 100 units x 3 vials, NDC 0023-1145-01, Lot [id number], Exp 01/2019, specialty pharmacy.//mck,rn** 

## 2016-06-30 NOTE — Progress Notes (Signed)
Chief Complaint  Patient presents with  . Left Spastic Hemiplegia    Botox 300 units - specialty pharmacy.      PATIENT: Yeira Gulden DOB: 14-Jun-1960  Chief Complaint  Patient presents with  . Left Spastic Hemiplegia    Botox 300 units - specialty pharmacy.     HISTORICAL  Lawanda Holzheimer is a 56 year old right-handed female  History of right internal carotid section with spastic left hemiparesis, Botox injection every 3 months  She has history of right internal carotid artery dissection following an episode of vomiting from migraine headache in mid June 2009. She was given IV TPA and found to have right MCA occlusion. She underwent emergent right carotid artery stent with distal endovascular recanalization of the middle cerebral artery.  She had small hemorrhagic transformation of the right basal ganglia with mild left hemiparesis, but has done well since then and has been independent.  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  She has been receiving BOTOX injection since 01/2009 every 3 months, to her left upper extremity and also left lower extremity, responded well.  Repeat US carotid were normal in October 2013  Chronic migraine headaches: She had long-standing history of chronic migraine, average 2-4 headaches each months, she has increased frequency headaches around fall, and spring, she is now taking Topamax ER 150 mg every night as preventive medication, magnesium oxide 400 mg,  riboflavin 100 mg twice a day,  She is not candidate for triptan because previous history of stroke, Cambia works well for her, she her insurance only allow her to have 9 tablets each months. Occasionally she take Fioricet, hydrocodone as needed, even prednisone 20 mg as needed as rescue therapy  Over the past few weeks, she been having to 3 headaches each week, used out her cambia, she also complains of neck muscle tightness, receiving chiropractor sometimes   Last Botox injection to spastic left upper extremity works well, but she noticed left hand muscle weakness.  UPDATE Mar 26 2015: She responded very well to previous Botox injection in September 2016, but noticed increased left hand weakness, gradually recovered over the past few weeks  Update July 15 2015: She responded very well to previous Botox injection in December 20 eighth 2016, noticed returning of left finger flexion spasticity, left ankle plantarflexion, want today's injection emphasize on above abnormal posturing.  She still has frequent migraine headaches, 1-2 headache each week, some headache was protracted, lasting 1-2 days, require multiple dose of cambia and Fioricet  Update November 20 2015: Patient called in September 09 2015 "She also said the left hand is very weak- 12,3 digits > 4,5. She said last OV she rec'd botox in wrist. She sts she may not be able to get botox next month if she is not feeling better"  She has a lot of pain on her left leg, mainly her left ankle from knee down, throbbing, tooth ache, she noticed more tendency of left ankle plantar flexion.  She continue have intermittent migraine headaches, more  at the left retro-orbital  Area  UPDATE Dec 6th 2017: She responded very well to previous Botox injection, complains frequent migraine headaches, once a week, lasting 1-2 days, partial response to combination of cambia, Fioricet, Compazine, she is taking Topamax ER 150 mg as preventive medications  UPDATE  June 30 2016: She had her first Botox injection for migraine prevention in December 2017, with only used 75 units she reported dramatic improvement, she only has migraines about once a week, which is milder, better controlled by the medication, instead of 2-3 times more severe headaches, at the end of the benefit, she noticed recurrent of her headaches, she also noticed mild left shoulder stiffness, left ankle turned in,  She noticed worsening right knee pain, recently had right knee injection.  Review of system: Pertinent as above   ALLERGIES: Allergies  Allergen Reactions  . Penicillins Anaphylaxis and Hives  . Imitrex [Sumatriptan]   . Other Swelling    kiwi  . Sulfa Antibiotics Hives  . Zomig [Zolmitriptan]     HOME MEDICATIONS: Current Outpatient Prescriptions  Medication Sig Dispense Refill  . ALPRAZolam (XANAX) 1 MG tablet Take 1/2 to 1 tablet 2 to 3 x/day ONLY if needed for Anxiety 270 tablet 0  . amphetamine-dextroamphetamine (ADDERALL) 10 MG tablet Take 1/2 to 1 tablet 1 or 2 x daily for ADD 60 tablet 0  . aspirin 325 MG EC tablet Take 325 mg by mouth daily.    . Botulinum Toxin Type A (BOTOX) 200 UNITS SOLR Inject as directed. Change to BOTOX A 100  units    . buPROPion (WELLBUTRIN XL) 300 MG 24 hr tablet TAKE 1 TABLET (300 MG TOTAL) BY MOUTH DAILY. WITH FOOD 90 tablet 3  . butalbital-acetaminophen-caffeine (FIORICET, ESGIC) 50-325-40 MG tablet TAKE 1 TABLET BY MOUTH EVERY 6 HOURS AS NEEDED FOR HEADACHE 30 tablet 5  . cetirizine (ZYRTEC) 10 MG tablet Take 10 mg by mouth daily.    . Diclofenac Potassium (CAMBIA) 50 MG PACK Take one packet at onset of migraine.  May repeat once in two hours, if needed.  Max dose 2 packets per day.  #27/90days 27 each 3  . ESTRING 2 MG vaginal ring Place 2 mg vaginally every 3 (three) months.     . fenofibrate micronized (LOFIBRA) 134 MG capsule TAKE 1 CAPSULE BY MOUTH EVERY DAY 90 capsule 3  . fluticasone furoate-vilanterol (BREO ELLIPTA)  100-25 MCG/INH AEPB Inhale 1 puff into the lungs daily. Rinse mouth with water after each use 3 each 3  . HYDROcodone-acetaminophen (NORCO/VICODIN) 5-325 MG tablet Take 1 tablet by mouth every 4 (four) hours as needed for moderate pain. 30 tablet 0  . levothyroxine (SYNTHROID, LEVOTHROID) 100 MCG tablet Take 1 tablet (100 mcg total) by mouth daily. 90 tablet 3  . magnesium oxide (MAG-OX) 400 MG tablet Take 400 mg by mouth 2 (two) times daily.     . montelukast (SINGULAIR) 10 MG tablet Take 1 tablet (10 mg total) by mouth at bedtime. 90 tablet 3  . Multiple Vitamin (MULTIVITAMIN WITH MINERALS) TABS tablet Take 1 tablet by mouth daily.    . nortriptyline (PAMELOR) 25 MG capsule Take 2 capsules (50 mg total) by mouth at bedtime. 60 capsule 11  . predniSONE (DELTASONE) 20 MG tablet TAKE 1 TABLET PER DAY AS NEEDED FOR HEADACHES. 90 tablet 1  . prochlorperazine (COMPAZINE) 5 MG tablet TAKE 1 TABLET BY MOUTH 3 TIMES A DAY AS NEEDED FOR NAUSEA 50 tablet 1  . PROVENTIL HFA 108 (  90 BASE) MCG/ACT inhaler INHALE 2 PUFFS EVERY 4 HOURS AS NEEDED FOR ASTHMA RESCUE 6.7 Inhaler 0  . QUDEXY XR 150 MG CS24 TAKE 1 CAPSULE DAILY 90 each 1  . ranitidine (ZANTAC) 300 MG tablet Take 1 to 2 tablets daily for heartburn & reflux to allow wean and transition from PPI / Omeprazole 180 tablet 1  . Riboflavin 100 MG TABS Take 100 mg by mouth. bid    . rosuvastatin (CRESTOR) 40 MG tablet     . tiZANidine (ZANAFLEX) 2 MG tablet Take 1 tablet (2 mg total) by mouth every 6 (six) hours as needed for muscle spasms. 30 tablet 6  . vitamin B-12 (CYANOCOBALAMIN) 1000 MCG tablet Take 1,000 mcg by mouth daily.    . Vitamin D, Ergocalciferol, (DRISDOL) 50000 units CAPS capsule TAKE 1 CAPSULE DAILY 30 capsule 1   No current facility-administered medications for this visit.     PAST MEDICAL HISTORY: Past Medical History:  Diagnosis Date  . Anemia   . Anxiety   . Asthma   . Chronic heartburn   . Depression   . Elevated cholesterol      on Crestor  . Headache(784.0)   . Hypertension   . Hypothyroidism    S/P thyroidectomy  . Migraine   . Occlusion and stenosis of carotid artery with cerebral infarction   . Spasm of muscle   . Unspecified cerebral artery occlusion with cerebral infarction   . Vitamin D deficiency     PAST SURGICAL HISTORY: Past Surgical History:  Procedure Laterality Date  . COSMETIC SURGERY    . CYSTOCELE REPAIR N/A 01/08/2013   Procedure: ANTERIOR REPAIR (CYSTOCELE);  Surgeon: Meriel Pica, MD;  Location: [facility];  Service: Gynecology;  Laterality: N/A;  . NASAL SINUS SURGERY    . VAGINAL HYSTERECTOMY      FAMILY HISTORY: Family History  Problem Relation Age of Onset  . Heart disease Mother   . Peptic Ulcer Disease Mother   . Heart disease Father   . Parkinson's disease Father     SOCIAL HISTORY:  Social History   Social History  . Marital status: Married    Spouse name: Brett Canales  . Number of children: 2  . Years of education: college   Occupational History  . DESIGNER Self   Social History Main Topics  . Smoking status: Never Smoker  . Smokeless tobacco: Never Used  . Alcohol use 0.0 oz/week     Comment: occassionally drinks a glass of wine  . Drug use: No  . Sexual activity: Not on file   Other Topics Concern  . 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 Patient is married and lives with her husband. Patient has college education.   Right handed.   Caffeine- two cups daily.     PHYSICAL EXAM   Vitals:   06/30/16 1347  BP: 138/82  Pulse: 96  Weight: 156 lb 4 oz (70.9 kg)  Height: 5' 8.75" (1.746 m)    Not recorded      Body mass index is 23.24 kg/m.  PHYSICAL EXAMNIATION:  Gen: NAD, conversant, well nourised, obese, well groomed                     Cardiovascular: Regular rate rhythm, no peripheral edema, warm, nontender. Eyes: Conjunctivae clear without exudates or hemorrhage Neck: Supple,  no carotid bruise. Pulmonary: Clear to auscultation bilaterally   NEUROLOGICAL  EXAM:  MENTAL STATUS: Speech:    Speech is normal; fluent and spontaneous with normal comprehension.  Cognition:     Orientation to time, place and person     Normal recent and remote memory     Normal Attention span and concentration     Normal Language, naming, repeating,spontaneous speech     Fund of knowledge   CRANIAL NERVES: CN II: Visual fields are full to confrontation. Fundoscopic exam is normal with sharp discs and no vascular changes. Pupils are round equal and briskly reactive to light. CN III, IV, VI: extraocular movement are normal. No ptosis. CN V: Facial sensation is intact to pinprick in all 3 divisions bilaterally. Corneal responses are intact.  CN VII: Face is symmetric with normal eye closure and smile. CN VIII: Hearing is normal to rubbing fingers CN IX, X: Palate elevates symmetrically. Phonation is normal. CN XI: Head turning and shoulder shrug are intact CN XII: Tongue is midline with normal movements and no atrophy.  MOTOR: Mild spastic left hemiparesis, left upper extremity proximal and distal 4/5, mild left hip flexion and left ankle dorsiflexion weakness  REFLEXES: Reflexes are 2+ and symmetric at the biceps, triceps, knees, and ankles. Plantar responses are flexor.  SENSORY: Intact to light touch, pinprick, position sense, and vibration sense are intact in fingers and toes.  COORDINATION: Rapid alternating movements and fine finger movements are intact. There is no dysmetria on finger-to-nose and heel-knee-shin.    GAIT/STANCE: Mildly unsteady gait, checking her left foot across the floor  Romberg is absent.   DIAGNOSTIC DATA (LABS, IMAGING, TESTING) - I reviewed patient records, labs, notes, testing and imaging myself where available.   ASSESSMENT AND PLAN  Mellina Benison is a 56 y.o. female   Chronic migraine headaches  Continue preventive medications, extended  release topiramate 150 mg every day  Cambia, Fioricet, Compazine as needed,+ tizanidine prn  Continue magnesium oxide, riboflavin as preventive medications,  Add on nortriptyline 25 mg titrating to 50 every night as migraine prevention   Spastic left hemiparesis following right internal carotid artery dissection, right MCA stroke  Electrical stimulation guided Botox injection, used 300 units of Botox A  Left pronator teres 25 units Left flexor carpi ulnaris 25 units Left flexor digitorum profoundi 25 units   Left pectoralis major 25 units Left teres major 25 units  Left tibialis posterior 25 units Left flexor digitorum longus 25 unitsx2=50 units  I also injected 100 units to   Frontalis 40 Procerus 5 Corrugate 10 Temporalis 20 units Occipital 25 units.  Levert Feinstein, M.D. Ph.D.  [facility] [address] [place], [place] [address] Ph: [phone] Fax: [phone]  CC: Referring Provider

## 2016-07-01 ENCOUNTER — Other Ambulatory Visit: Payer: Self-pay | Admitting: Obstetrics and Gynecology

## 2016-07-01 DIAGNOSIS — Z1231 Encounter for screening mammogram for malignant neoplasm of breast: Secondary | ICD-10-CM

## 2016-07-01 NOTE — Telephone Encounter (Signed)
I called patient to schedule her next injection. She did not answer so I left a VM asking for her to call me back.

## 2016-07-01 NOTE — Telephone Encounter (Signed)
I met with the patient regarding her medication yesterday.

## 2016-07-13 DIAGNOSIS — Z0289 Encounter for other administrative examinations: Secondary | ICD-10-CM

## 2016-07-20 ENCOUNTER — Ambulatory Visit (INDEPENDENT_AMBULATORY_CARE_PROVIDER_SITE_OTHER): Payer: BLUE CROSS/BLUE SHIELD | Admitting: Physician Assistant

## 2016-07-20 ENCOUNTER — Encounter: Payer: Self-pay | Admitting: Physician Assistant

## 2016-07-20 VITALS — BP 128/84 | HR 106 | Temp 97.7°F | Resp 14 | Ht 68.75 in | Wt 158.2 lb

## 2016-07-20 DIAGNOSIS — R1030 Lower abdominal pain, unspecified: Secondary | ICD-10-CM | POA: Diagnosis not present

## 2016-07-20 DIAGNOSIS — K2100 Gastro-esophageal reflux disease with esophagitis, without bleeding: Secondary | ICD-10-CM

## 2016-07-20 DIAGNOSIS — R1013 Epigastric pain: Secondary | ICD-10-CM

## 2016-07-20 LAB — CBC WITH DIFFERENTIAL/PLATELET
Basophils Absolute: 0 cells/uL (ref 0–200)
Basophils Relative: 0 %
Eosinophils Absolute: 324 cells/uL (ref 15–500)
Eosinophils Relative: 6 %
HCT: 45.3 % — ABNORMAL HIGH (ref 35.0–45.0)
Hemoglobin: 15.6 g/dL — ABNORMAL HIGH (ref 11.7–15.5)
Lymphocytes Relative: 43 %
Lymphs Abs: 2322 cells/uL (ref 850–3900)
MCH: 32 pg (ref 27.0–33.0)
MCHC: 34.4 g/dL (ref 32.0–36.0)
MCV: 93 fL (ref 80.0–100.0)
MPV: 10.7 fL (ref 7.5–12.5)
Monocytes Absolute: 486 cells/uL (ref 200–950)
Monocytes Relative: 9 %
Neutro Abs: 2268 cells/uL (ref 1500–7800)
Neutrophils Relative %: 42 %
Platelets: 190 10*3/uL (ref 140–400)
RBC: 4.87 MIL/uL (ref 3.80–5.10)
RDW: 13 % (ref 11.0–15.0)
WBC: 5.4 10*3/uL (ref 3.8–10.8)

## 2016-07-20 LAB — HEPATIC FUNCTION PANEL
ALT: 40 U/L — ABNORMAL HIGH (ref 6–29)
AST: 22 U/L (ref 10–35)
Albumin: 4.3 g/dL (ref 3.6–5.1)
Alkaline Phosphatase: 85 U/L (ref 33–130)
Bilirubin, Direct: 0.1 mg/dL (ref <=0.2)
Indirect Bilirubin: 0.4 mg/dL (ref 0.2–1.2)
Total Bilirubin: 0.5 mg/dL (ref 0.2–1.2)
Total Protein: 6.8 g/dL (ref 6.1–8.1)

## 2016-07-20 LAB — BASIC METABOLIC PANEL WITH GFR
BUN: 17 mg/dL (ref 7–25)
CO2: 23 mmol/L (ref 20–31)
Calcium: 9.6 mg/dL (ref 8.6–10.4)
Chloride: 108 mmol/L (ref 98–110)
Creat: 1.12 mg/dL — ABNORMAL HIGH (ref 0.50–1.05)
GFR, Est African American: 63 mL/min (ref >=60)
GFR, Est Non African American: 55 mL/min — ABNORMAL LOW (ref >=60)
Glucose, Bld: 76 mg/dL (ref 65–99)
Potassium: 4 mmol/L (ref 3.5–5.3)
Sodium: 143 mmol/L (ref 135–146)

## 2016-07-20 NOTE — Progress Notes (Signed)
Subjective:    Patient ID: Ruth Gregory, female    DOB: 06/30/1960, 56 y.o.   MRN: [id number]  Gastroesophageal Reflux  She complains of abdominal pain. She reports no chest pain, no coughing, no nausea, no sore throat or no wheezing.  Diarrhea   Associated symptoms include abdominal pain and headaches. Pertinent negatives include no chills, coughing, fever, myalgias or vomiting.   56 y.o. WF with history of CVA with subsequent spastic hemiplegia left side, chol, HTN, preDM presents with AB cramping like menstrual cramps, and bloating  Had "Ruth Gregory" rejuvenation procedure on April 11th with Dr. Marcelle Overlie, and she states that was feeling better until took full ASA for a migraine on Sat then with Sunday had severe burping/chest pain Sunday night., with pain straight through to her back, no SOB, diaphoresis. She has history of IBS. Concerned because her mother had gall bladder cancer at this same age. She has not had any irregular bowl movements.   Normal colonoscopy at age 56, 7 years ago.   Temperature 97.7 F (36.5 C), resp. rate 14, height 5' 8.75" (1.746 m), weight 158 lb 3.2 oz (71.8 kg).  Current Outpatient Prescriptions on File Prior to Visit  Medication Sig Dispense Refill  . ALPRAZolam (XANAX) 1 MG tablet Take 1/2 to 1 tablet 2 to 3 x/day ONLY if needed for Anxiety 270 tablet 0  . amphetamine-dextroamphetamine (ADDERALL) 10 MG tablet Take 1/2 to 1 tablet 1 or 2 x daily for ADD 60 tablet 0  . aspirin 325 MG EC tablet Take 325 mg by mouth daily.    . Botulinum Toxin Type A (BOTOX) 200 UNITS SOLR Inject as directed. Change to BOTOX A 100  units    . buPROPion (WELLBUTRIN XL) 300 MG 24 hr tablet TAKE 1 TABLET (300 MG TOTAL) BY MOUTH DAILY. WITH FOOD 90 tablet 3  . butalbital-acetaminophen-caffeine (FIORICET, ESGIC) 50-325-40 MG tablet TAKE 1 TABLET BY MOUTH EVERY 6 HOURS AS NEEDED FOR HEADACHE 30 tablet 5  . cetirizine (ZYRTEC) 10 MG tablet Take 10 mg by mouth daily.    . Diclofenac  Potassium (CAMBIA) 50 MG PACK Take one packet at onset of migraine.  May repeat once in two hours, if needed.  Max dose 2 packets per day.  #27/90days 27 each 3  . ESTRING 2 MG vaginal ring Place 2 mg vaginally every 3 (three) months.     . fenofibrate micronized (LOFIBRA) 134 MG capsule TAKE 1 CAPSULE BY MOUTH EVERY DAY 90 capsule 3  . fluticasone furoate-vilanterol (BREO ELLIPTA) 100-25 MCG/INH AEPB Inhale 1 puff into the lungs daily. Rinse mouth with water after each use 3 each 3  . HYDROcodone-acetaminophen (NORCO/VICODIN) 5-325 MG tablet Take 1 tablet by mouth every 4 (four) hours as needed for moderate pain. 30 tablet 0  . levothyroxine (SYNTHROID, LEVOTHROID) 100 MCG tablet Take 1 tablet (100 mcg total) by mouth daily. 90 tablet 3  . magnesium oxide (MAG-OX) 400 MG tablet Take 400 mg by mouth 2 (two) times daily.     . montelukast (SINGULAIR) 10 MG tablet Take 1 tablet (10 mg total) by mouth at bedtime. 90 tablet 3  . Multiple Vitamin (MULTIVITAMIN WITH MINERALS) TABS tablet Take 1 tablet by mouth daily.    . nortriptyline (PAMELOR) 25 MG capsule Take 2 capsules (50 mg total) by mouth at bedtime. 60 capsule 11  . predniSONE (DELTASONE) 20 MG tablet TAKE 1 TABLET PER DAY AS NEEDED FOR HEADACHES. 90 tablet 1  . prochlorperazine (COMPAZINE) 5  MG tablet TAKE 1 TABLET BY MOUTH 3 TIMES A DAY AS NEEDED FOR NAUSEA 50 tablet 1  . PROVENTIL HFA 108 (90 BASE) MCG/ACT inhaler INHALE 2 PUFFS EVERY 4 HOURS AS NEEDED FOR ASTHMA RESCUE 6.7 Inhaler 0  . QUDEXY XR 150 MG CS24 TAKE 1 CAPSULE DAILY 90 each 1  . ranitidine (ZANTAC) 300 MG tablet Take 1 to 2 tablets daily for heartburn & reflux to allow wean and transition from PPI / Omeprazole 180 tablet 1  . Riboflavin 100 MG TABS Take 100 mg by mouth. bid    . rosuvastatin (CRESTOR) 40 MG tablet     . tiZANidine (ZANAFLEX) 2 MG tablet Take 1 tablet (2 mg total) by mouth every 6 (six) hours as needed for muscle spasms. 30 tablet 6  . vitamin B-12  (CYANOCOBALAMIN) 1000 MCG tablet Take 1,000 mcg by mouth daily.    . Vitamin D, Ergocalciferol, (DRISDOL) 50000 units CAPS capsule TAKE 1 CAPSULE DAILY 30 capsule 1   No current facility-administered medications on file prior to visit.    Past Medical History:  Diagnosis Date  . Anemia   . Anxiety   . Asthma   . Chronic heartburn   . Depression   . Elevated cholesterol    on Crestor  . Headache(784.0)   . Hypertension   . Hypothyroidism    S/P thyroidectomy  . Migraine   . Occlusion and stenosis of carotid artery with cerebral infarction   . Spasm of muscle   . Unspecified cerebral artery occlusion with cerebral infarction   . Vitamin D deficiency    Review of Systems  Constitutional: Negative for chills, diaphoresis and fever.  HENT: Negative for congestion, ear discharge, ear pain, hearing loss, nosebleeds, sore throat and tinnitus.   Eyes: Negative.   Respiratory: Negative for cough, shortness of breath, wheezing and stridor.   Cardiovascular: Negative.  Negative for chest pain and leg swelling.  Gastrointestinal: Positive for abdominal distention (with GERD) and abdominal pain. Negative for anal bleeding, blood in stool, constipation, diarrhea, nausea, rectal pain and vomiting.  Genitourinary: Negative for dysuria, frequency and urgency.  Musculoskeletal: Negative for back pain, myalgias and neck pain.  Skin: Negative.   Neurological: Positive for headaches. Negative for dizziness, tremors, seizures and weakness.  Psychiatric/Behavioral: Negative for hallucinations and suicidal ideas. The patient is nervous/anxious.        Objective:   Physical Exam  Constitutional: She appears well-developed and well-nourished.  HENT:  Head: Normocephalic and atraumatic.  Eyes: Conjunctivae are normal. Pupils are equal, round, and reactive to light.  Neck: Normal range of motion. Neck supple.  Cardiovascular: Normal rate and regular rhythm.   Pulmonary/Chest: Effort normal and  breath sounds normal.  Abdominal: Soft. Bowel sounds are normal. She exhibits no distension and no mass. There is tenderness (very mild RUQ tenderness, no peritoneal signs). There is no rebound and no guarding.       Assessment & Plan:  GERD/AB pain Normal AB exam, no peritoneal signs Likely due to GERD- get back on reflux med x 2 weeks, if not better will refer to GI for EGD AB pain possible pelvic spasm, take muscle relaxer The patient was advised to call immediately if she has any concerning symptoms in the interval. All questions were answered. The patient knows to call the clinic with any problems, questions or concerns or go to the ER if any further progression of symptoms.

## 2016-07-20 NOTE — Patient Instructions (Signed)
Get on the heart burn medication Can take the muscle relaxer day of the surgery and day after, discuss with Dr. Marcelle Overlie   Food Choices for Gastroesophageal Reflux Disease, Adult When you have gastroesophageal reflux disease (GERD), the foods you eat and your eating habits are very important. Choosing the right foods can help ease the discomfort of GERD. Consider working with a diet and nutrition specialist (dietitian) to help you make healthy food choices. What general guidelines should I follow? Eating plan   Choose healthy foods low in fat, such as fruits, vegetables, whole grains, low-fat dairy products, and lean meat, fish, and poultry.  Eat frequent, small meals instead of three large meals each day. Eat your meals slowly, in a relaxed setting. Avoid bending over or lying down until 2-3 hours after eating.  Limit high-fat foods such as fatty meats or fried foods.  Limit your intake of oils, butter, and shortening to less than 8 teaspoons each day.  Avoid the following:  Foods that cause symptoms. These may be different for different people. Keep a food diary to keep track of foods that cause symptoms.  Alcohol.  Drinking large amounts of liquid with meals.  Eating meals during the 2-3 hours before bed.  Cook foods using methods other than frying. This may include baking, grilling, or broiling. Lifestyle    Maintain a healthy weight. Ask your health care provider what weight is healthy for you. If you need to lose weight, work with your health care provider to do so safely.  Exercise for at least 30 minutes on 5 or more days each week, or as told by your health care provider.  Avoid wearing clothes that fit tightly around your waist and chest.  Do not use any products that contain nicotine or tobacco, such as cigarettes and e-cigarettes. If you need help quitting, ask your health care provider.  Sleep with the head of your bed raised. Use a wedge under the mattress or  blocks under the bed frame to raise the head of the bed. What foods are not recommended? The items listed may not be a complete list. Talk with your dietitian about what dietary choices are best for you. Grains  Pastries or quick breads with added fat. [place] toast. Vegetables  Deep fried vegetables. [place] fries. Any vegetables prepared with added fat. Any vegetables that cause symptoms. For some people this may include tomatoes and tomato products, chili peppers, onions and garlic, and horseradish. Fruits  Any fruits prepared with added fat. Any fruits that cause symptoms. For some people this may include citrus fruits, such as oranges, grapefruit, pineapple, and lemons. Meats and other protein foods  High-fat meats, such as fatty beef or pork, hot dogs, ribs, ham, sausage, salami and bacon. Fried meat or protein, including fried fish and fried chicken. Nuts and nut butters. Dairy  Whole milk and chocolate milk. Sour cream. Cream. Ice cream. Cream cheese. Milk shakes. Beverages  Coffee and tea, with or without caffeine. Carbonated beverages. Sodas. Energy drinks. Fruit juice made with acidic fruits (such as orange or grapefruit). Tomato juice. Alcoholic drinks. Fats and oils  Butter. Margarine. Shortening. Ghee. Sweets and desserts  Chocolate and cocoa. Donuts. Seasoning and other foods  Pepper. Peppermint and spearmint. Any condiments, herbs, or seasonings that cause symptoms. For some people, this may include curry, hot sauce, or vinegar-based salad dressings. Summary  When you have gastroesophageal reflux disease (GERD), food and lifestyle choices are very important to help ease the discomfort of  GERD.  Eat frequent, small meals instead of three large meals each day. Eat your meals slowly, in a relaxed setting. Avoid bending over or lying down until 2-3 hours after eating.  Limit high-fat foods such as fatty meat or fried foods. This information is not intended to replace advice  given to you by your health care provider. Make sure you discuss any questions you have with your health care provider. Document Released: 03/15/2005 Document Revised: 03/16/2016 Document Reviewed: 03/16/2016 Elsevier Interactive Patient Education  2017 [employer].

## 2016-07-21 LAB — AMYLASE: Amylase: 44 U/L (ref 0–105)

## 2016-07-26 ENCOUNTER — Ambulatory Visit
Admission: RE | Admit: 2016-07-26 | Discharge: 2016-07-26 | Disposition: A | Discharge status: Home / Self Care | Payer: BLUE CROSS/BLUE SHIELD | Source: Ambulatory Visit | Attending: Obstetrics and Gynecology | Admitting: Obstetrics and Gynecology

## 2016-07-26 DIAGNOSIS — Z1231 Encounter for screening mammogram for malignant neoplasm of breast: Secondary | ICD-10-CM

## 2016-08-03 ENCOUNTER — Ambulatory Visit
Admission: RE | Admit: 2016-08-03 | Discharge: 2016-08-03 | Disposition: A | Discharge status: Home / Self Care | Payer: BLUE CROSS/BLUE SHIELD | Source: Ambulatory Visit | Attending: Physician Assistant | Admitting: Physician Assistant

## 2016-08-03 DIAGNOSIS — R1013 Epigastric pain: Secondary | ICD-10-CM

## 2016-08-11 ENCOUNTER — Other Ambulatory Visit: Payer: Self-pay | Admitting: Internal Medicine

## 2016-08-23 NOTE — Progress Notes (Deleted)
Assessment and Plan:   Hypertension -Continue medication, monitor blood pressure at home. Continue DASH diet.  Reminder to go to the ER if any CP, SOB, nausea, dizziness, severe HA, changes vision/speech, left arm numbness and tingling and jaw pain.   Cholesterol -Continue diet and exercise. Check cholesterol since off crestor   Prediabetes  -Continue diet and exercise. Check A1C   Vitamin D Def - check level and continue medications.    Migraine ? Allergy related/TMJ- continue meds PRN, discuss with Dr. Terrace Gregory, take xanx PRN  Hypothyroidism- check TSH level, continue medications the same, reminded to take on an empty stomach 30-72mins before food.   Continue diet and meds as discussed. Further disposition pending results of labs. Over 30 minutes of exam, counseling, chart review, and critical decision making was performed  HPI 56 y.o. female  presents for 3 month follow up on hypertension, cholesterol, prediabetes, and vitamin D deficiency.   Her blood pressure has been controlled at home, today their BP is    She does workout. She denies chest pain, shortness of breath, dizziness.  She had a Right MCA CVA attributed to a Rt Int Carotid Art Dissection suspect for Fibromuscular dysplasia and manifest at that time with a left spastic HP not totally resolved for which she still receives Botox injections by Dr Ruth Gregory preventing flexion contractures of the LUE.  She has high anxiety for social situations and had a lot of anxiety on easter which worsens her migraines.  She is on adderall which helps with concentration, and follows with Dr. Terrace Gregory. She is interested in trying adding on testosterone to her estrogen.     She is on cholesterol medication, she is off crestor due to fatigue/myalgias, states she thinks it is the generic. Her cholesterol is at goal. The cholesterol last visit was:   Lab Results  Component Value Date   CHOL 162 05/18/2016   HDL 59 05/18/2016   LDLCALC 76 05/18/2016   TRIG  136 05/18/2016   CHOLHDL 2.7 05/18/2016    She has been working on diet and exercise for prediabetes, and denies paresthesia of the feet, polydipsia, polyuria and visual disturbances. Last A1C in the office was:  Lab Results  Component Value Date   HGBA1C 5.3 05/18/2016  Patient is on Vitamin D supplement.   Lab Results  Component Value Date   VD25OH 59 05/18/2016   She is on thyroid medication. Her medication was not changed last visit.   Lab Results  Component Value Date   TSH 0.35 (L) 05/18/2016    Current Medications:  Current Outpatient Prescriptions on File Prior to Visit  Medication Sig Dispense Refill  . ALPRAZolam (XANAX) 1 MG tablet Take 1/2 to 1 tablet 2 to 3 x/day ONLY if needed for Anxiety 270 tablet 0  . amphetamine-dextroamphetamine (ADDERALL) 10 MG tablet Take 1/2 to 1 tablet 1 or 2 x daily for ADD 60 tablet 0  . aspirin 325 MG EC tablet Take 325 mg by mouth daily.    . Botulinum Toxin Type A (BOTOX) 200 UNITS SOLR Inject as directed. Change to BOTOX A 100  units    . buPROPion (WELLBUTRIN XL) 300 MG 24 hr tablet TAKE 1 TABLET (300 MG TOTAL) BY MOUTH DAILY. WITH FOOD 90 tablet 3  . butalbital-acetaminophen-caffeine (FIORICET, ESGIC) 50-325-40 MG tablet TAKE 1 TABLET BY MOUTH EVERY 6 HOURS AS NEEDED FOR HEADACHE 30 tablet 5  . cetirizine (ZYRTEC) 10 MG tablet Take 10 mg by mouth daily.    Marland Kitchen  Diclofenac Potassium (CAMBIA) 50 MG PACK Take one packet at onset of migraine.  May repeat once in two hours, if needed.  Max dose 2 packets per day.  #27/90days 27 each 3  . ESTRING 2 MG vaginal ring Place 2 mg vaginally every 3 (three) months.     . fenofibrate micronized (LOFIBRA) 134 MG capsule TAKE 1 CAPSULE EVERY DAY 90 capsule 3  . fluticasone furoate-vilanterol (BREO ELLIPTA) 100-25 MCG/INH AEPB Inhale 1 puff into the lungs daily. Rinse mouth with water after each use 3 each 3  . HYDROcodone-acetaminophen (NORCO/VICODIN) 5-325 MG tablet Take 1 tablet by mouth every 4 (four)  hours as needed for moderate pain. 30 tablet 0  . levothyroxine (SYNTHROID, LEVOTHROID) 100 MCG tablet Take 1 tablet (100 mcg total) by mouth daily. 90 tablet 3  . magnesium oxide (MAG-OX) 400 MG tablet Take 400 mg by mouth 2 (two) times daily.     . montelukast (SINGULAIR) 10 MG tablet Take 1 tablet (10 mg total) by mouth at bedtime. 90 tablet 3  . Multiple Vitamin (MULTIVITAMIN WITH MINERALS) TABS tablet Take 1 tablet by mouth daily.    . nortriptyline (PAMELOR) 25 MG capsule Take 2 capsules (50 mg total) by mouth at bedtime. 60 capsule 11  . predniSONE (DELTASONE) 20 MG tablet TAKE 1 TABLET PER DAY AS NEEDED FOR HEADACHES. 90 tablet 1  . prochlorperazine (COMPAZINE) 5 MG tablet TAKE 1 TABLET BY MOUTH 3 TIMES A DAY AS NEEDED FOR NAUSEA 50 tablet 1  . PROVENTIL HFA 108 (90 BASE) MCG/ACT inhaler INHALE 2 PUFFS EVERY 4 HOURS AS NEEDED FOR ASTHMA RESCUE 6.7 Inhaler 0  . QUDEXY XR 150 MG CS24 TAKE 1 CAPSULE DAILY 90 each 1  . ranitidine (ZANTAC) 300 MG tablet Take 1 to 2 tablets daily for heartburn & reflux to allow wean and transition from PPI / Omeprazole 180 tablet 1  . Riboflavin 100 MG TABS Take 100 mg by mouth. bid    . rosuvastatin (CRESTOR) 40 MG tablet     . tiZANidine (ZANAFLEX) 2 MG tablet Take 1 tablet (2 mg total) by mouth every 6 (six) hours as needed for muscle spasms. 30 tablet 6  . vitamin B-12 (CYANOCOBALAMIN) 1000 MCG tablet Take 1,000 mcg by mouth daily.    . Vitamin D, Ergocalciferol, (DRISDOL) 50000 units CAPS capsule TAKE 1 CAPSULE DAILY 30 capsule 1   No current facility-administered medications on file prior to visit.    Medical History:  Past Medical History:  Diagnosis Date  . Anemia   . Anxiety   . Asthma   . Chronic heartburn   . Depression   . Elevated cholesterol    on Crestor  . Headache(784.0)   . Hypertension   . Hypothyroidism    S/P thyroidectomy  . Migraine   . Occlusion and stenosis of carotid artery with cerebral infarction   . Spasm of muscle    . Unspecified cerebral artery occlusion with cerebral infarction   . Vitamin D deficiency    Allergies:  Allergies  Allergen Reactions  . Penicillins Anaphylaxis and Hives  . Imitrex [Sumatriptan]   . Other Swelling    kiwi  . Sulfa Antibiotics Hives  . Zomig [Zolmitriptan]      Review of Systems:  Review of Systems  Constitutional: Positive for malaise/fatigue. Negative for chills, diaphoresis, fever and weight loss.  HENT: Negative for congestion, ear discharge, ear pain, hearing loss, nosebleeds, sore throat and tinnitus.   Eyes: Negative.   Respiratory:  Negative for cough, hemoptysis, sputum production, shortness of breath, wheezing and stridor.   Cardiovascular: Negative.  Negative for chest pain and leg swelling.  Gastrointestinal: Negative.  Negative for blood in stool.  Genitourinary: Negative.  Negative for dysuria, frequency and urgency.  Musculoskeletal: Positive for back pain, joint pain and neck pain. Negative for falls and myalgias.  Skin: Negative.   Neurological: Positive for headaches. Negative for dizziness, tingling, tremors, sensory change, speech change, focal weakness, seizures, loss of consciousness and weakness.  Psychiatric/Behavioral: Positive for depression and memory loss. Negative for hallucinations, substance abuse and suicidal ideas. The patient is nervous/anxious and has insomnia.     Family history- Review and unchanged Social history- Review and unchanged Physical Exam: There were no vitals taken for this visit. Wt Readings from Last 3 Encounters:  07/20/16 158 lb 3.2 oz (71.8 kg)  06/30/16 156 lb 4 oz (70.9 kg)  05/18/16 156 lb 9.6 oz (71 kg)   General Appearance: Well nourished, in no apparent distress. Eyes: PERRLA, EOMs, conjunctiva no swelling or erythema, normal fundi and vessels. Sinuses: No frontal/maxillary tenderness ENT/Mouth: EACs patent / TMs  nl. Nares clear without erythema, swelling, mucoid exudates. Oral hygiene is good. No  erythema, swelling, or exudate. Tongue normal, non-obstructing. Tonsils not swollen or erythematous. Hearing normal. + TMJ tenderness Neck: Supple, thyroid normal. No bruits, nodes or JVD. Respiratory: Respiratory effort normal.  BS equal and clear bilateral without rales, rhonci, wheezing or stridor. Cardio: Heart sounds are normal with regular rate and rhythm and no murmurs, rubs or gallops. Peripheral pulses are normal and equal bilaterally without edema. No aortic or femoral bruits. Chest: symmetric with normal excursions and percussion. Breasts: Symmetric, without lumps, nipple discharge, retractions, or fibrocystic changes.  Abdomen: Flat, soft, with bowl sounds. Nontender, no guarding, rebound, hernias, masses, or organomegaly.  Lymphatics: Non tender without lymphadenopathy.  Musculoskeletal:  Mild spastic Lt HP and flexion type contracturing of the Lt wrist and hand. Otherwise full ROM of the Rt extremities, with normal joint stability, strength on the Rt  and  gait is normal  Skin: Warm and dry without rashes, lesions, cyanosis, clubbing or  ecchymosis.  Neuro: Cranial nerves intact, reflexes slightly increased on the Left and muscle tone likewise sl. Increased on the Left. No cerebellar symptoms. Sensation intact.  Pysch: Alert and oriented X 3, normal affect.    Ruth MullingAmanda Marnell Mcdaniel, PA-C 4:23 PM [facility]

## 2016-08-24 ENCOUNTER — Ambulatory Visit: Payer: Self-pay | Admitting: Physician Assistant

## 2016-08-26 ENCOUNTER — Other Ambulatory Visit: Payer: Self-pay | Admitting: Physician Assistant

## 2016-08-26 MED ORDER — AMPHETAMINE-DEXTROAMPHETAMINE 10 MG PO TABS: ORAL_TABLET | ORAL | 0 refills | Status: DC

## 2016-09-02 ENCOUNTER — Telehealth: Payer: Self-pay | Admitting: Neurology

## 2016-09-02 NOTE — Telephone Encounter (Signed)
Pt has questions re: Botox that she would like to speak with you about, please call.

## 2016-09-02 NOTE — Telephone Encounter (Signed)
Pt would like to be put on schedule for a Botox appointment

## 2016-09-02 NOTE — Telephone Encounter (Signed)
Patient has been scheduled

## 2016-09-14 ENCOUNTER — Telehealth: Payer: Self-pay | Admitting: Neurology

## 2016-09-14 NOTE — Telephone Encounter (Signed)
I have talked with him, she had protracted headaches since September 10 2016, she has tried Human resources officerCambia, Vicodin, prednisone, and Fioricet without significant improvement  I have asked her to come in for IV infusion, Depacon 500 mg times 2, Toradol 60 mg, Compazine 5 mg versus Phenergan 25 mg

## 2016-09-14 NOTE — Telephone Encounter (Signed)
Patient called office in reference to migraine she has had since Friday thru the weekend went away yesterday and back again today.  Patient would like to know if she is able to come in to have a migraine cocktail.  Please call

## 2016-09-17 ENCOUNTER — Other Ambulatory Visit: Payer: Self-pay | Admitting: Neurology

## 2016-09-30 ENCOUNTER — Other Ambulatory Visit: Payer: Self-pay | Admitting: Internal Medicine

## 2016-10-04 NOTE — Progress Notes (Signed)
Assessment and Plan:   Hypertension -Continue medication, monitor blood pressure at home. Continue DASH diet.  Reminder to go to the ER if any CP, SOB, nausea, dizziness, severe HA, changes vision/speech, left arm numbness and tingling and jaw pain.   Cholesterol -Continue diet and exercise. Check cholesterol since off crestor   Prediabetes  -Continue diet and exercise. Check A1C   Vitamin D Def - check level and continue medications.    Migraine Allergy related/TMJ- continue meds PRN, discuss with Dr. Terrace Arabia, take xanx PRN  Hypothyroidism- check TSH level, continue medications the same, reminded to take on an empty stomach 30-62mins before food.   Cerebrovascular accident (CVA) due to thrombosis of right carotid artery (HCC) Continue   Postoperative hypothyroidism -     TSH  Spastic hemiplegia of left nondominant side as late effect of cerebrovascular disease, unspecified cerebrovascular disease type (HCC) Continue follow up cardio  Mixed hyperlipidemia -     CBC with Differential/Platelet -     BASIC METABOLIC PANEL WITH GFR -     Hepatic function panel -     Lipid panel  Elevated blood pressure reading without diagnosis of hypertension  Medication management -     Magnesium  Prediabetes  Gastroesophageal reflux disease with esophagitis  Diarrhea, unspecified type Better, if comes back will go to GI -     Celiac Disease Comprehensive Panel with Reflexes    Continue diet and meds as discussed. Further disposition pending results of labs. Over 30 minutes of exam, counseling, chart review, and critical decision making was performed  HPI 56 y.o. female  presents for 3 month follow up on hypertension, cholesterol, prediabetes, and vitamin D deficiency.   Her blood pressure has been controlled at home, today their BP is BP: 126/84  She does workout. She denies chest pain, shortness of breath, dizziness.  She had a Right MCA CVA attributed to a Rt Int Carotid Art  Dissection suspect for Fibromuscular dysplasia and manifest at that time with a left spastic HP not totally resolved for which she still receives Botox injections by Dr Terrace Arabia preventing flexion contractures of the LUE.   She has had diarrhea last week, had stopped the omeprazole but has stopped when she started back and took 12 imodium up to 3 days and she has not had anymore diarrhea. Watery diarrhea, no sick contacts, no one else sick, no fever, chills. Due for colonoscopy 2 years.  She is on adderall which helps with concentration, and follows with Dr. Terrace Arabia. She has anxiety and this worsens her migraines.   She is on cholesterol medication, she is off crestor due to fatigue/myalgias, states she thinks it is the generic. Her cholesterol is at goal. The cholesterol last visit was:   Lab Results  Component Value Date   CHOL 162 05/18/2016   HDL 59 05/18/2016   LDLCALC 76 05/18/2016   TRIG 136 05/18/2016   CHOLHDL 2.7 05/18/2016    She has been working on diet and exercise for prediabetes, and denies paresthesia of the feet, polydipsia, polyuria and visual disturbances. Last A1C in the office was:  Lab Results  Component Value Date   HGBA1C 5.3 05/18/2016  Patient is on Vitamin D supplement.   Lab Results  Component Value Date   VD25OH 4 05/18/2016   She is on thyroid medication. Her medication was not changed last visit, taking once daily   Lab Results  Component Value Date   TSH 0.35 (L) 05/18/2016    Current  Medications:  Current Outpatient Prescriptions on File Prior to Visit  Medication Sig Dispense Refill  . aspirin 325 MG EC tablet Take 325 mg by mouth daily.    . Botulinum Toxin Type A (BOTOX) 200 UNITS SOLR Inject as directed. Change to BOTOX A 100  units    . buPROPion (WELLBUTRIN XL) 300 MG 24 hr tablet TAKE 1 TABLET (300 MG TOTAL) BY MOUTH DAILY. WITH FOOD 90 tablet 3  . butalbital-acetaminophen-caffeine (FIORICET, ESGIC) 50-325-40 MG tablet TAKE 1 TABLET BY MOUTH EVERY 6  HOURS AS NEEDED FOR HEADACHE 30 tablet 5  . CAMBIA 50 MG PACK TAKE 1 PACKET BY MOUTH AS NEEDED. 27 each 3  . cetirizine (ZYRTEC) 10 MG tablet Take 10 mg by mouth daily.    . Diclofenac Potassium (CAMBIA) 50 MG PACK Take one packet at onset of migraine.  May repeat once in two hours, if needed.  Max dose 2 packets per day.  #27/90days 27 each 3  . ESTRING 2 MG vaginal ring Place 2 mg vaginally every 3 (three) months.     . fenofibrate micronized (LOFIBRA) 134 MG capsule TAKE 1 CAPSULE EVERY DAY 90 capsule 3  . fluticasone furoate-vilanterol (BREO ELLIPTA) 100-25 MCG/INH AEPB Inhale 1 puff into the lungs daily. Rinse mouth with water after each use 3 each 3  . levothyroxine (SYNTHROID, LEVOTHROID) 100 MCG tablet Take 1 tablet (100 mcg total) by mouth daily. 90 tablet 3  . magnesium oxide (MAG-OX) 400 MG tablet Take 400 mg by mouth 2 (two) times daily.     . montelukast (SINGULAIR) 10 MG tablet Take 1 tablet (10 mg total) by mouth at bedtime. 90 tablet 3  . Multiple Vitamin (MULTIVITAMIN WITH MINERALS) TABS tablet Take 1 tablet by mouth daily.    . nortriptyline (PAMELOR) 25 MG capsule Take 2 capsules (50 mg total) by mouth at bedtime. 60 capsule 11  . omeprazole (PRILOSEC) 40 MG capsule TAKE 1 CAPSULE (40 MG TOTAL) BY MOUTH DAILY. FOR ACID INDIGESTION AND REFLUX 30 capsule 5  . predniSONE (DELTASONE) 20 MG tablet TAKE 1 TABLET PER DAY AS NEEDED FOR HEADACHES. 90 tablet 1  . prochlorperazine (COMPAZINE) 5 MG tablet TAKE 1 TABLET BY MOUTH 3 TIMES A DAY AS NEEDED FOR NAUSEA 50 tablet 1  . PROVENTIL HFA 108 (90 BASE) MCG/ACT inhaler INHALE 2 PUFFS EVERY 4 HOURS AS NEEDED FOR ASTHMA RESCUE 6.7 Inhaler 0  . QUDEXY XR 150 MG CS24 TAKE 1 CAPSULE DAILY 90 each 1  . ranitidine (ZANTAC) 300 MG tablet Take 1 to 2 tablets daily for heartburn & reflux to allow wean and transition from PPI / Omeprazole 180 tablet 1  . Riboflavin 100 MG TABS Take 100 mg by mouth. bid    . rosuvastatin (CRESTOR) 40 MG tablet     .  tiZANidine (ZANAFLEX) 2 MG tablet Take 1 tablet (2 mg total) by mouth every 6 (six) hours as needed for muscle spasms. 30 tablet 6  . vitamin B-12 (CYANOCOBALAMIN) 1000 MCG tablet Take 1,000 mcg by mouth daily.    . Vitamin D, Ergocalciferol, (DRISDOL) 50000 units CAPS capsule TAKE 1 CAPSULE DAILY 30 capsule 1  . amphetamine-dextroamphetamine (ADDERALL) 10 MG tablet Take 1/2 to 1 tablet 1 or 2 x daily for ADD 60 tablet 0   No current facility-administered medications on file prior to visit.    Medical History:  Past Medical History:  Diagnosis Date  . Anemia   . Anxiety   . Asthma   .  Chronic heartburn   . Depression   . Elevated cholesterol    on Crestor  . Headache(784.0)   . Hypertension   . Hypothyroidism    S/P thyroidectomy  . Migraine   . Occlusion and stenosis of carotid artery with cerebral infarction   . Spasm of muscle   . Unspecified cerebral artery occlusion with cerebral infarction   . Vitamin D deficiency    Allergies:  Allergies  Allergen Reactions  . Penicillins Anaphylaxis and Hives  . Imitrex [Sumatriptan]   . Other Swelling    kiwi  . Sulfa Antibiotics Hives  . Zomig [Zolmitriptan]      Review of Systems:  Review of Systems  Constitutional: Positive for malaise/fatigue. Negative for chills, diaphoresis, fever and weight loss.  HENT: Negative for congestion, ear discharge, ear pain, hearing loss, nosebleeds, sore throat and tinnitus.   Eyes: Negative.   Respiratory: Negative for cough, hemoptysis, sputum production, shortness of breath, wheezing and stridor.   Cardiovascular: Negative.  Negative for chest pain and leg swelling.  Gastrointestinal: Negative.  Negative for blood in stool.  Genitourinary: Negative.  Negative for dysuria, frequency and urgency.  Musculoskeletal: Positive for back pain, joint pain and neck pain. Negative for falls and myalgias.  Skin: Negative.   Neurological: Positive for headaches. Negative for dizziness, tingling,  tremors, sensory change, speech change, focal weakness, seizures, loss of consciousness and weakness.  Psychiatric/Behavioral: Positive for depression and memory loss. Negative for hallucinations, substance abuse and suicidal ideas. The patient is nervous/anxious and has insomnia.     Family history- Review and unchanged Social history- Review and unchanged Physical Exam: BP 126/84   Pulse (!) 102 Comment: PT REPORTS HER RESTING PULSE IS ALWAYS HIGH AT HOME.  Temp 97.9 F (36.6 C)   Resp 16   Ht 5' 8.75" (1.746 m)   Wt 156 lb 6.4 oz (70.9 kg)   SpO2 96%   BMI 23.26 kg/m  Wt Readings from Last 3 Encounters:  10/05/16 156 lb 6.4 oz (70.9 kg)  07/20/16 158 lb 3.2 oz (71.8 kg)  06/30/16 156 lb 4 oz (70.9 kg)   General Appearance: Well nourished, in no apparent distress. Eyes: PERRLA, EOMs, conjunctiva no swelling or erythema, normal fundi and vessels. Sinuses: No frontal/maxillary tenderness ENT/Mouth: EACs patent / TMs  nl. Nares clear without erythema, swelling, mucoid exudates. Oral hygiene is good. No erythema, swelling, or exudate. Tongue normal, non-obstructing. Tonsils not swollen or erythematous. Hearing normal. + TMJ tenderness Neck: Supple, thyroid normal. No bruits, nodes or JVD. Respiratory: Respiratory effort normal.  BS equal and clear bilateral without rales, rhonci, wheezing or stridor. Cardio: Heart sounds are normal with regular rate and rhythm and no murmurs, rubs or gallops. Peripheral pulses are normal and equal bilaterally without edema. No aortic or femoral bruits. Chest: symmetric with normal excursions and percussion. Breasts: Symmetric, without lumps, nipple discharge, retractions, or fibrocystic changes.  Abdomen: Flat, soft, with bowl sounds. Nontender, no guarding, rebound, hernias, masses, or organomegaly.  Lymphatics: Non tender without lymphadenopathy.  Musculoskeletal:  Mild spastic Lt HP and flexion type contracturing of the Lt wrist and hand. Otherwise  full ROM of the Rt extremities, with normal joint stability, strength on the Rt  and  gait is normal  Skin: Warm and dry without rashes, lesions, cyanosis, clubbing or  ecchymosis.  Neuro: Cranial nerves intact, reflexes slightly increased on the Left and muscle tone likewise sl. Increased on the Left. No cerebellar symptoms. Sensation intact.  Pysch: Alert and oriented  X 3, normal affect.    Quentin MullingAmanda Tiyona Desouza, PA-C 11:52 AM [facility]

## 2016-10-05 ENCOUNTER — Ambulatory Visit (INDEPENDENT_AMBULATORY_CARE_PROVIDER_SITE_OTHER): Payer: BLUE CROSS/BLUE SHIELD | Admitting: Physician Assistant

## 2016-10-05 ENCOUNTER — Encounter: Payer: Self-pay | Admitting: Physician Assistant

## 2016-10-05 VITALS — BP 126/84 | HR 102 | Temp 97.9°F | Resp 16 | Ht 68.75 in | Wt 156.4 lb

## 2016-10-05 DIAGNOSIS — R7303 Prediabetes: Secondary | ICD-10-CM | POA: Diagnosis not present

## 2016-10-05 DIAGNOSIS — I69954 Hemiplegia and hemiparesis following unspecified cerebrovascular disease affecting left non-dominant side: Secondary | ICD-10-CM | POA: Diagnosis not present

## 2016-10-05 DIAGNOSIS — Z79899 Other long term (current) drug therapy: Secondary | ICD-10-CM | POA: Diagnosis not present

## 2016-10-05 DIAGNOSIS — I63031 Cerebral infarction due to thrombosis of right carotid artery: Secondary | ICD-10-CM | POA: Diagnosis not present

## 2016-10-05 DIAGNOSIS — E782 Mixed hyperlipidemia: Secondary | ICD-10-CM

## 2016-10-05 DIAGNOSIS — R197 Diarrhea, unspecified: Secondary | ICD-10-CM

## 2016-10-05 DIAGNOSIS — R030 Elevated blood-pressure reading, without diagnosis of hypertension: Secondary | ICD-10-CM

## 2016-10-05 DIAGNOSIS — K2100 Gastro-esophageal reflux disease with esophagitis, without bleeding: Secondary | ICD-10-CM

## 2016-10-05 DIAGNOSIS — E890 Postprocedural hypothyroidism: Secondary | ICD-10-CM | POA: Diagnosis not present

## 2016-10-05 LAB — CBC WITH DIFFERENTIAL/PLATELET
Basophils Absolute: 0 cells/uL (ref 0–200)
Basophils Relative: 0 %
Eosinophils Absolute: 270 cells/uL (ref 15–500)
Eosinophils Relative: 6 %
HCT: 43.8 % (ref 35.0–45.0)
Hemoglobin: 14.9 g/dL (ref 11.7–15.5)
Lymphocytes Relative: 40 %
Lymphs Abs: 1800 cells/uL (ref 850–3900)
MCH: 31.6 pg (ref 27.0–33.0)
MCHC: 34 g/dL (ref 32.0–36.0)
MCV: 93 fL (ref 80.0–100.0)
MPV: 11 fL (ref 7.5–12.5)
Monocytes Absolute: 360 cells/uL (ref 200–950)
Monocytes Relative: 8 %
Neutro Abs: 2070 cells/uL (ref 1500–7800)
Neutrophils Relative %: 46 %
Platelets: 214 10*3/uL (ref 140–400)
RBC: 4.71 MIL/uL (ref 3.80–5.10)
RDW: 13.1 % (ref 11.0–15.0)
WBC: 4.5 10*3/uL (ref 3.8–10.8)

## 2016-10-05 NOTE — Patient Instructions (Addendum)
What is the TMJ? The temporomandibular (tem-PUH-ro-man-DIB-yoo-ler) joint, or the TMJ, connects the upper and lower jawbones. This joint allows the jaw to open wide and move back and forth when you chew, talk, or yawn.There are also several muscles that help this joint move. There can be muscle tightness and pain in the muscle that can cause several symptoms.  What causes TMJ pain? There are many causes of TMJ pain. Repeated chewing (for example, chewing gum) and clenching your teeth can cause pain in the joint. Some TMJ pain has no obvious cause. What can I do to ease the pain? There are many things you can do to help your pain get better. When you have pain:  Eat soft foods and stay away from chewy foods (for example, taffy) Try to use both sides of your mouth to chew Don't chew gum Massage Don't open your mouth wide (for example, during yawning or singing) Don't bite your cheeks or fingernails Lower your amount of stress and worry Applying a warm, damp washcloth to the joint may help. Over-the-counter pain medicines such as ibuprofen (one brand: Advil) or acetaminophen (one brand: Tylenol) might also help. Do not use these medicines if you are allergic to them or if your doctor told you not to use them. How can I stop the pain from coming back? When your pain is better, you can do these exercises to make your muscles stronger and to keep the pain from coming back:  Resisted mouth opening: Place your thumb or two fingers under your chin and open your mouth slowly, pushing up lightly on your chin with your thumb. Hold for three to six seconds. Close your mouth slowly. Resisted mouth closing: Place your thumbs under your chin and your two index fingers on the ridge between your mouth and the bottom of your chin. Push down lightly on your chin as you close your mouth. Tongue up: Slowly open and close your mouth while keeping the tongue touching the roof of the mouth. Side-to-side jaw movement:  Place an object about one fourth of an inch thick (for example, two tongue depressors) between your front teeth. Slowly move your jaw from side to side. Increase the thickness of the object as the exercise becomes easier Forward jaw movement: Place an object about one fourth of an inch thick between your front teeth and move the bottom jaw forward so that the bottom teeth are in front of the top teeth. Increase the thickness of the object as the exercise becomes easier. These exercises should not be painful. If it hurts to do these exercises, stop doing them and talk to your family doctor.   If your diarrhea comes back or gets worse, we can send you to a stomach doctor Can continue crestor and stop fenofibrate.  Do you want to see physical therapy for your TMJ? I think this may help your migraines   Bland Diet A bland diet consists of foods that do not have a lot of fat or fiber. Foods without fat or fiber are easier for the body to digest. They are also less likely to irritate your mouth, throat, stomach, and other parts of your gastrointestinal tract. A bland diet is sometimes called a BRAT diet. What is my plan? Your health care provider or dietitian may recommend specific changes to your diet to prevent and treat your symptoms, such as:  Eating small meals often.  Cooking food until it is soft enough to chew easily.  Chewing your food well.  Drinking fluids slowly.  Not eating foods that are very spicy, sour, or fatty.  Not eating citrus fruits, such as oranges and grapefruit.  What do I need to know about this diet?  Eat a variety of foods from the bland diet food list.  Do not follow a bland diet longer than you have to.  Ask your health care provider whether you should take vitamins. What foods can I eat? Grains  Hot cereals, such as cream of wheat. Bread, crackers, or tortillas made from refined white flour. Rice. Vegetables Canned or cooked vegetables. Mashed or boiled  potatoes. Fruits Bananas. Applesauce. Other types of cooked or canned fruit with the skin and seeds removed, such as canned peaches or pears. Meats and Other Protein Sources Scrambled eggs. Creamy peanut butter or other nut butters. Lean, well-cooked meats, such as chicken or fish. Tofu. Soups or broths. Dairy Low-fat dairy products, such as milk, cottage cheese, or yogurt. Beverages Water. Herbal tea. Apple juice. Sweets and Desserts Pudding. Custard. Fruit gelatin. Ice cream. Fats and Oils Mild salad dressings. Canola or olive oil. The items listed above may not be a complete list of allowed foods or beverages. Contact your dietitian for more options. What foods are not recommended? Foods and ingredients that are often not recommended include:  Spicy foods, such as hot sauce or salsa.  Fried foods.  Sour foods, such as pickled or fermented foods.  Raw vegetables or fruits, especially citrus or berries.  Caffeinated drinks.  Alcohol.  Strongly flavored seasonings or condiments.  The items listed above may not be a complete list of foods and beverages that are not allowed. Contact your dietitian for more information. This information is not intended to replace advice given to you by your health care provider. Make sure you discuss any questions you have with your health care provider. Document Released: 07/07/2015 Document Revised: 08/21/2015 Document Reviewed: 03/27/2014 Elsevier Interactive Patient Education  2018 [employer]  Why we do not prescribe Armour Thyroid: 1) Armour is purified porcine (pig) thyroid glands, which is NOT without risk for contaminants.  2) The ratio between T3 and T4 in Armour thyroid is physiologic for pigs, NOT for humans.  3) The short half life of T3 can cause fluctuations in blood levels, which can result in mood swings and heart rhythm abnormalities.  4) the concentration of the active substances (T4 and T3) can be expected to vary between  different Armour lots, which can cause variation in the thyroid function tests.

## 2016-10-06 LAB — BASIC METABOLIC PANEL WITH GFR
BUN: 12 mg/dL (ref 7–25)
CO2: 19 mmol/L — ABNORMAL LOW (ref 20–31)
Calcium: 9.8 mg/dL (ref 8.6–10.4)
Chloride: 108 mmol/L (ref 98–110)
Creat: 1.21 mg/dL — ABNORMAL HIGH (ref 0.50–1.05)
GFR, Est African American: 57 mL/min — ABNORMAL LOW (ref >=60)
GFR, Est Non African American: 50 mL/min — ABNORMAL LOW (ref >=60)
Glucose, Bld: 99 mg/dL (ref 65–99)
Potassium: 4.6 mmol/L (ref 3.5–5.3)
Sodium: 140 mmol/L (ref 135–146)

## 2016-10-06 LAB — HEPATIC FUNCTION PANEL
ALT: 21 U/L (ref 6–29)
AST: 17 U/L (ref 10–35)
Albumin: 4.4 g/dL (ref 3.6–5.1)
Alkaline Phosphatase: 75 U/L (ref 33–130)
Bilirubin, Direct: 0.1 mg/dL (ref <=0.2)
Indirect Bilirubin: 0.4 mg/dL (ref 0.2–1.2)
Total Bilirubin: 0.5 mg/dL (ref 0.2–1.2)
Total Protein: 6.8 g/dL (ref 6.1–8.1)

## 2016-10-06 LAB — CELIAC DISEASE COMPREHENSIVE PANEL WITH REFLEXES
IgA: 178 mg/dL (ref 81–463)
Tissue Transglutaminase Ab, IgA: 1 U/mL (ref <4)

## 2016-10-06 LAB — LIPID PANEL
Cholesterol: 182 mg/dL (ref <200)
HDL: 57 mg/dL (ref >50)
LDL Cholesterol: 98 mg/dL (ref <100)
Total CHOL/HDL Ratio: 3.2 Ratio (ref <5.0)
Triglycerides: 137 mg/dL (ref <150)
VLDL: 27 mg/dL (ref <30)

## 2016-10-06 LAB — TSH: TSH: 0.55 mIU/L

## 2016-10-06 LAB — MAGNESIUM: Magnesium: 2.1 mg/dL (ref 1.5–2.5)

## 2016-10-08 ENCOUNTER — Telehealth: Payer: Self-pay | Admitting: Neurology

## 2016-10-08 NOTE — Telephone Encounter (Signed)
Patient called office asking to reschedule her Botox appointment for 10/12/16.  Please call

## 2016-10-11 NOTE — Telephone Encounter (Signed)
I called to reschedule patient for botox injection, she did not answer so I left a VM asking her to call me back.

## 2016-10-12 ENCOUNTER — Ambulatory Visit: Payer: BLUE CROSS/BLUE SHIELD | Admitting: Neurology

## 2016-10-12 NOTE — Telephone Encounter (Signed)
I scheduled patient for Botox on 11-03-16 with Dr. Terrace ArabiaYan but patient would like a call back to see if she can get in sooner.

## 2016-10-13 NOTE — Telephone Encounter (Signed)
Ruth Gregory

## 2016-10-13 NOTE — Telephone Encounter (Signed)
Last Botox injection June 30 2016,  Frequent severe migraine headaches, please let her come in tomorrow for Botox injection as migraine prevention

## 2016-10-13 NOTE — Telephone Encounter (Signed)
Left message for a return call.  We can work her into the schedule to be seen sooner.

## 2016-10-14 ENCOUNTER — Ambulatory Visit: Payer: BLUE CROSS/BLUE SHIELD | Admitting: Neurology

## 2016-10-14 NOTE — Telephone Encounter (Signed)
Left another message for a return call.  We can work patient in for an earlier Botox appt.

## 2016-10-25 ENCOUNTER — Other Ambulatory Visit: Payer: Self-pay | Admitting: *Deleted

## 2016-10-25 ENCOUNTER — Telehealth: Payer: Self-pay | Admitting: *Deleted

## 2016-10-25 ENCOUNTER — Other Ambulatory Visit: Payer: Self-pay | Admitting: Neurology

## 2016-10-25 MED ORDER — TOPIRAMATE ER 200 MG PO CAP24: 200.0000 mg | ORAL_CAPSULE | Freq: Every day | ORAL | 11 refills | Status: DC

## 2016-10-25 NOTE — Telephone Encounter (Signed)
Dr. Terrace ArabiaYan authorized refills of Qudexy XR via pharmacy fax request.  The medication is not covered on her insurance policy without trial and failure of Trokendi XR.  Dr. Terrace ArabiaYan has changed her prescription to Trokendi XR 200mg , one capsule at bedtime.  Patient agreeable to the change.  Pharmacy notified.

## 2016-11-03 ENCOUNTER — Ambulatory Visit (INDEPENDENT_AMBULATORY_CARE_PROVIDER_SITE_OTHER): Payer: BLUE CROSS/BLUE SHIELD | Admitting: Neurology

## 2016-11-03 DIAGNOSIS — I69954 Hemiplegia and hemiparesis following unspecified cerebrovascular disease affecting left non-dominant side: Secondary | ICD-10-CM | POA: Diagnosis not present

## 2016-11-03 DIAGNOSIS — G43019 Migraine without aura, intractable, without status migrainosus: Secondary | ICD-10-CM | POA: Diagnosis not present

## 2016-11-03 DIAGNOSIS — I63031 Cerebral infarction due to thrombosis of right carotid artery: Secondary | ICD-10-CM

## 2016-11-04 NOTE — Progress Notes (Signed)
No chief complaint on file.     PATIENT: Ruth Gregory DOB: 06/01/1960  No chief complaint on file.    HISTORICAL  Ruth Gregory is a 56 year old right-handed female  History of right internal carotid section with spastic left hemiparesis, Botox injection every 3 months  She has history of right internal carotid artery dissection following an episode of vomiting from migraine headache in mid June 2009. She was given IV TPA and found to have right MCA occlusion. She underwent emergent right carotid artery stent with distal endovascular recanalization of the middle cerebral artery.  She had small hemorrhagic transformation of the right basal ganglia with mild left hemiparesis, but has done well since then and has been independent.  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  She has made marked recovery, ambulating only with very mild difficulty,  maintain majority of her left arm function,  there is mild limitation in the range of motion in her left shoulder,  mild left shoulder stiffness and pain, most bothersome symptoms is her left elbow discomfort, left wrist achy pain, difficulty releasing left hand flexion, left arm persistent flexion,  left ankle plantar inversion, increased gait difficulty after prolonged walking, This has all been helped by Botox injection.  She has been receiving BOTOX injection since 01/2009 every 3 months, to her left upper extremity and also left lower extremity, responded well.  Repeat US carotid were normal in October 2013  Chronic migraine headaches: She had long-standing history of chronic migraine, average 2-4 headaches each months, she has increased frequency headaches around fall, and spring, she is now taking Topamax ER 150 mg every night as preventive medication, magnesium oxide 400 mg, riboflavin 100 mg twice a day,  She is not candidate for triptan because previous history of stroke, Cambia works well for her, she her insurance only allow her to  have 9 tablets each months. Occasionally she take Fioricet, hydrocodone as needed, even prednisone 20 mg as needed as rescue therapy  Over the past few weeks, she been having to 3 headaches each week, used out her cambia, she also complains of neck muscle tightness, receiving chiropractor sometimes   Last Botox injection to spastic left upper extremity works well, but she noticed left hand muscle weakness.  UPDATE Mar 26 2015: She responded very well to previous Botox injection in September 2016, but noticed increased left hand weakness, gradually recovered over the past few weeks  Update July 15 2015: She responded very well to previous Botox injection in December 20 eighth 2016, noticed returning of left finger flexion spasticity, left ankle plantarflexion, want today's injection emphasize on above abnormal posturing.  She still has frequent migraine headaches, 1-2 headache each week, some headache was protracted, lasting 1-2 days, require multiple dose of cambia and Fioricet  Update November 20 2015: Patient called in September 09 2015 "She also said the left hand is very weak- 12,3 digits > 4,5. She said last OV she rec'd botox in wrist. She sts she may not be able to get botox next month if she is not feeling better"  She has a lot of pain on her left leg, mainly her left ankle from knee down, throbbing, tooth ache, she noticed more tendency of left ankle plantar flexion.  She continue have intermittent migraine headaches, more at the left retro-orbital  Area  UPDATE Dec 6th 2017: She responded very well to previous Botox injection, complains frequent migraine headaches, once a week, lasting 1-2 days, partial response to  combination of cambia, Fioricet, Compazine, she is taking Topamax ER 150 mg as preventive medications  UPDATE June 30 2016: She had her first Botox injection for migraine prevention in December 2017, with only used 75 units she reported dramatic improvement, she only has  migraines about once a week, which is milder, better controlled by the medication, instead of 2-3 times more severe headaches, at the end of the benefit, she noticed recurrent of her headaches, she also noticed mild left shoulder stiffness, left ankle turned in,  She noticed worsening right knee pain, recently had right knee injection.  Update November 04 2016: She responded well to previous injection, Botox injection to bilateral frontal temporal region has helped her headache,  Review of system: Pertinent as above   ALLERGIES: Allergies  Allergen Reactions  . Penicillins Anaphylaxis and Hives  . Imitrex [Sumatriptan]   . Other Swelling    kiwi  . Sulfa Antibiotics Hives  . Zomig [Zolmitriptan]     HOME MEDICATIONS: Current Outpatient Prescriptions  Medication Sig Dispense Refill  . amphetamine-dextroamphetamine (ADDERALL) 10 MG tablet Take 1/2 to 1 tablet 1 or 2 x daily for ADD 60 tablet 0  . aspirin 325 MG EC tablet Take 325 mg by mouth daily.    . Botulinum Toxin Type A (BOTOX) 200 UNITS SOLR Inject as directed. Change to BOTOX A 100  units    . buPROPion (WELLBUTRIN XL) 300 MG 24 hr tablet TAKE 1 TABLET (300 MG TOTAL) BY MOUTH DAILY. WITH FOOD 90 tablet 3  . butalbital-acetaminophen-caffeine (FIORICET, ESGIC) 50-325-40 MG tablet TAKE 1 TABLET BY MOUTH EVERY 6 HOURS AS NEEDED FOR HEADACHE 30 tablet 5  . CAMBIA 50 MG PACK TAKE 1 PACKET BY MOUTH AS NEEDED. 27 each 3  . cetirizine (ZYRTEC) 10 MG tablet Take 10 mg by mouth daily.    . Diclofenac Potassium (CAMBIA) 50 MG PACK Take one packet at onset of migraine.  May repeat once in two hours, if needed.  Max dose 2 packets per day.  #27/90days 27 each 3  . ESTRING 2 MG vaginal ring Place 2 mg vaginally every 3 (three) months.     . fenofibrate micronized (LOFIBRA) 134 MG capsule TAKE 1 CAPSULE EVERY DAY 90 capsule 3  . fluticasone furoate-vilanterol (BREO ELLIPTA) 100-25 MCG/INH AEPB Inhale 1 puff into the lungs daily. Rinse mouth with  water after each use 3 each 3  . levothyroxine (SYNTHROID, LEVOTHROID) 100 MCG tablet Take 1 tablet (100 mcg total) by mouth daily. 90 tablet 3  . magnesium oxide (MAG-OX) 400 MG tablet Take 400 mg by mouth 2 (two) times daily.     . montelukast (SINGULAIR) 10 MG tablet Take 1 tablet (10 mg total) by mouth at bedtime. 90 tablet 3  . Multiple Vitamin (MULTIVITAMIN WITH MINERALS) TABS tablet Take 1 tablet by mouth daily.    . nortriptyline (PAMELOR) 25 MG capsule Take 2 capsules (50 mg total) by mouth at bedtime. 60 capsule 11  . omeprazole (PRILOSEC) 40 MG capsule TAKE 1 CAPSULE (40 MG TOTAL) BY MOUTH DAILY. FOR ACID INDIGESTION AND REFLUX 30 capsule 5  . predniSONE (DELTASONE) 20 MG tablet TAKE 1 TABLET PER DAY AS NEEDED FOR HEADACHES. 90 tablet 1  . prochlorperazine (COMPAZINE) 5 MG tablet TAKE 1 TABLET BY MOUTH 3 TIMES A DAY AS NEEDED FOR NAUSEA 50 tablet 1  . PROVENTIL HFA 108 (90 BASE) MCG/ACT inhaler INHALE 2 PUFFS EVERY 4 HOURS AS NEEDED FOR ASTHMA RESCUE 6.7 Inhaler 0  .  ranitidine (ZANTAC) 300 MG tablet Take 1 to 2 tablets daily for heartburn & reflux to allow wean and transition from PPI / Omeprazole 180 tablet 1  . Riboflavin 100 MG TABS Take 100 mg by mouth. bid    . rosuvastatin (CRESTOR) 40 MG tablet     . tiZANidine (ZANAFLEX) 2 MG tablet Take 1 tablet (2 mg total) by mouth every 6 (six) hours as needed for muscle spasms. 30 tablet 6  . Topiramate ER (TROKENDI XR) 200 MG CP24 Take 200 mg by mouth at bedtime. 30 capsule 11  . vitamin B-12 (CYANOCOBALAMIN) 1000 MCG tablet Take 1,000 mcg by mouth daily.    . Vitamin D, Ergocalciferol, (DRISDOL) 50000 units CAPS capsule TAKE 1 CAPSULE DAILY 30 capsule 1   No current facility-administered medications for this visit.     PAST MEDICAL HISTORY: Past Medical History:  Diagnosis Date  . Anemia   . Anxiety   . Asthma   . Chronic heartburn   . Depression   . Elevated cholesterol    on Crestor  . Headache(784.0)   . Hypertension   .  Hypothyroidism    S/P thyroidectomy  . Migraine   . Occlusion and stenosis of carotid artery with cerebral infarction   . Spasm of muscle   . Unspecified cerebral artery occlusion with cerebral infarction   . Vitamin D deficiency     PAST SURGICAL HISTORY: Past Surgical History:  Procedure Laterality Date  . AUGMENTATION MAMMAPLASTY Bilateral 2000  . COSMETIC SURGERY    . CYSTOCELE REPAIR N/A 01/08/2013   Procedure: ANTERIOR REPAIR (CYSTOCELE);  Surgeon: Meriel Pica, MD;  Location: [facility];  Service: Gynecology;  Laterality: N/A;  . NASAL SINUS SURGERY    . VAGINAL HYSTERECTOMY      FAMILY HISTORY: Family History  Problem Relation Age of Onset  . Heart disease Mother   . Peptic Ulcer Disease Mother   . Heart disease Father   . Parkinson's disease Father     SOCIAL HISTORY:  Social History   Social History  . Marital status: Married    Spouse name: Brett Canales  . Number of children: 2  . Years of education: college   Occupational History  . DESIGNER Self   Social History Main Topics  . Smoking status: Never Smoker  . Smokeless tobacco: Never Used  . Alcohol use 0.0 oz/week     Comment: occassionally drinks a glass of wine  . Drug use: No  . Sexual activity: Not on file   Other Topics Concern  . 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 Patient is married and lives with her husband. Patient has college education.   Right handed.   Caffeine- two cups daily.     PHYSICAL EXAM   There were no vitals filed for this visit.  Not recorded      There is no height or weight on file to calculate BMI.  PHYSICAL EXAMNIATION:  Gen: NAD, conversant, well nourised, obese, well groomed                     Cardiovascular: Regular rate rhythm, no peripheral edema, warm, nontender. Eyes: Conjunctivae clear without exudates or hemorrhage Neck: Supple, no carotid bruise. Pulmonary: Clear to auscultation  bilaterally   NEUROLOGICAL EXAM:  MENTAL STATUS: Speech:    Speech is normal; fluent and spontaneous with normal comprehension.  Cognition:     Orientation  to time, place and person     Normal recent and remote memory     Normal Attention span and concentration     Normal Language, naming, repeating,spontaneous speech     Fund of knowledge   CRANIAL NERVES: CN II: Visual fields are full to confrontation. Fundoscopic exam is normal with sharp discs and no vascular changes. Pupils are round equal and briskly reactive to light. CN III, IV, VI: extraocular movement are normal. No ptosis. CN V: Facial sensation is intact to pinprick in all 3 divisions bilaterally. Corneal responses are intact.  CN VII: Face is symmetric with normal eye closure and smile. CN VIII: Hearing is normal to rubbing fingers CN IX, X: Palate elevates symmetrically. Phonation is normal. CN XI: Head turning and shoulder shrug are intact CN XII: Tongue is midline with normal movements and no atrophy.  MOTOR: Mild spastic left hemiparesis, left upper extremity proximal and distal 4/5, mild left hip flexion and left ankle dorsiflexion weakness  REFLEXES: Reflexes are 2+ and symmetric at the biceps, triceps, knees, and ankles. Plantar responses are flexor.  SENSORY: Intact to light touch, pinprick, position sense, and vibration sense are intact in fingers and toes.  COORDINATION: Rapid alternating movements and fine finger movements are intact. There is no dysmetria on finger-to-nose and heel-knee-shin.    GAIT/STANCE: Mildly unsteady gait, checking her left foot across the floor  Romberg is absent.   DIAGNOSTIC DATA (LABS, IMAGING, TESTING) - I reviewed patient records, labs, notes, testing and imaging myself where available.   ASSESSMENT AND PLAN  Ruth Gregory is a 56 y.o. female   Chronic migraine headaches  Continue preventive medications, extended release topiramate 150 mg every day  Cambia,  Fioricet, Compazine as needed,+ tizanidine prn  Continue magnesium oxide, riboflavin as preventive medications,  Add on nortriptyline 25 mg titrating to 50 every night as migraine prevention   Spastic left hemiparesis following right internal carotid artery dissection, right MCA stroke  Electrical stimulation guided Botox injection, used 300 units of Botox A  Left pronator teres 25 units Left flexor carpi ulnaris 25 units Left flexor digitorum profoundi 25 units Left pectoralis major 25 units  Left teres major 25 units Left latissimus dorsi 25 units Left brachialis 25 units Left tibialis posterior 25 units  Left flexor digitorum longus 25 unitsx2=50 units  I also injected  50 units for chronic migraine headache  Frontalis 20 Temporalis 20 units Occipital 10 units.  Levert Feinstein, M.D. Ph.D.  [facility] [address], [address] [place], [place] [address] Ph: [phone] Fax: [phone]  CC: Referring Provider

## 2016-11-05 ENCOUNTER — Other Ambulatory Visit: Payer: Self-pay | Admitting: Neurology

## 2016-11-08 ENCOUNTER — Other Ambulatory Visit: Payer: Self-pay | Admitting: Physician Assistant

## 2016-11-08 MED ORDER — AMPHETAMINE-DEXTROAMPHETAMINE 10 MG PO TABS: ORAL_TABLET | ORAL | 0 refills | Status: DC

## 2016-11-08 NOTE — Telephone Encounter (Signed)
Faxed printed/signed rx fioricet to [facility] at [phone]. Received confirmation.

## 2016-11-24 ENCOUNTER — Ambulatory Visit: Payer: Self-pay | Admitting: Internal Medicine

## 2016-12-03 NOTE — Progress Notes (Signed)
Assessment and Plan:   Hypertension -Continue medication, monitor blood pressure at home. Continue DASH diet.  Reminder to go to the ER if any CP, SOB, nausea, dizziness, severe HA, changes vision/speech, left arm numbness and tingling and jaw pain.   Cholesterol -Continue diet and exercise. Check cholesterol since off crestor   Vitamin D Def - check level and continue medications.    Migraine Allergy related/TMJ- continue meds PRN, just got mouth gaurd, discuss with Dr. Terrace Arabia, take xanx PRN  Hypothyroidism- check TSH level, continue medications the same, reminded to take on an empty stomach 30-65mins before food.   Cerebrovascular accident (CVA) due to thrombosis of right carotid artery (HCC) Continue   Postoperative hypothyroidism Hypothyroidism-check TSH level, continue medications the same, reminded to take on an empty stomach 30-6mins before food.  -     TSH  Spastic hemiplegia of left nondominant side as late effect of cerebrovascular disease, unspecified cerebrovascular disease type (HCC) Continue follow up cardio  Medication management -     Magnesium  Continue diet and meds as discussed. Further disposition pending results of labs. Over 30 minutes of exam, counseling, chart review, and critical decision making was performed  HPI 56 y.o. female  presents for 3 month follow up on hypertension, cholesterol, prediabetes, and vitamin D deficiency.   Her blood pressure has been controlled at home, today their BP is  BP Readings from Last 3 Encounters:  12/06/16 (!) 140/92  10/05/16 126/84  07/20/16 128/84    She does workout. She denies chest pain, shortness of breath, dizziness.  She had a Right MCA CVA 10 years ago attributed to a Rt Int Carotid Art Dissection suspect for Fibromuscular dysplasia and manifest at that time with a left spastic HP not totally resolved for which she still receives Botox injections by Dr Terrace Arabia preventing flexion contractures of the LUE.   She is  on adderall which helps with concentration, and follows with Dr. Terrace Arabia.   She is on cholesterol medication, she is off crestor due to fatigue/myalgias, states she thinks it is the generic. Her cholesterol is at goal. The cholesterol last visit was:   Lab Results  Component Value Date   CHOL 182 10/05/2016   HDL 57 10/05/2016   LDLCALC 98 10/05/2016   TRIG 137 10/05/2016   CHOLHDL 3.2 10/05/2016    She has been working on diet and exercise for prediabetes, and denies paresthesia of the feet, polydipsia, polyuria and visual disturbances. Last A1C in the office was:  Lab Results  Component Value Date   HGBA1C 5.3 05/18/2016   Patient is on Vitamin D supplement.   Lab Results  Component Value Date   VD25OH 16 05/18/2016   She is on thyroid medication. Her medication was not changed last visit, taking once daily   Lab Results  Component Value Date   TSH 0.55 10/05/2016   BMI is Body mass index is 23.5 kg/m., she is working on diet and exercise. Wt Readings from Last 3 Encounters:  12/06/16 158 lb (71.7 kg)  10/05/16 156 lb 6.4 oz (70.9 kg)  07/20/16 158 lb 3.2 oz (71.8 kg)   She is on prilosec for reflux and diarrhea that helps.   Current Medications:  Current Outpatient Prescriptions on File Prior to Visit  Medication Sig Dispense Refill  . amphetamine-dextroamphetamine (ADDERALL) 10 MG tablet Take 1/2 to 1 tablet 1 or 2 x daily for ADD 60 tablet 0  . aspirin 325 MG EC tablet Take 325 mg by  mouth daily.    . Botulinum Toxin Type A (BOTOX) 200 UNITS SOLR Inject as directed. Change to BOTOX A 100  units    . buPROPion (WELLBUTRIN XL) 300 MG 24 hr tablet TAKE 1 TABLET (300 MG TOTAL) BY MOUTH DAILY. WITH FOOD 90 tablet 3  . butalbital-acetaminophen-caffeine (FIORICET, ESGIC) 50-325-40 MG tablet TAKE 1 TABLET BY MOUTH EVERY 6 HOURS AS NEEDED FOR HEADACHE 30 tablet 5  . CAMBIA 50 MG PACK TAKE 1 PACKET BY MOUTH AS NEEDED. 27 each 3  . cetirizine (ZYRTEC) 10 MG tablet Take 10 mg by mouth  daily.    . Diclofenac Potassium (CAMBIA) 50 MG PACK Take one packet at onset of migraine.  May repeat once in two hours, if needed.  Max dose 2 packets per day.  #27/90days 27 each 3  . ESTRING 2 MG vaginal ring Place 2 mg vaginally every 3 (three) months.     . fenofibrate micronized (LOFIBRA) 134 MG capsule TAKE 1 CAPSULE EVERY DAY 90 capsule 3  . fluticasone furoate-vilanterol (BREO ELLIPTA) 100-25 MCG/INH AEPB Inhale 1 puff into the lungs daily. Rinse mouth with water after each use 3 each 3  . levothyroxine (SYNTHROID, LEVOTHROID) 100 MCG tablet Take 1 tablet (100 mcg total) by mouth daily. 90 tablet 3  . magnesium oxide (MAG-OX) 400 MG tablet Take 400 mg by mouth 2 (two) times daily.     . montelukast (SINGULAIR) 10 MG tablet Take 1 tablet (10 mg total) by mouth at bedtime. 90 tablet 3  . Multiple Vitamin (MULTIVITAMIN WITH MINERALS) TABS tablet Take 1 tablet by mouth daily.    . nortriptyline (PAMELOR) 25 MG capsule Take 2 capsules (50 mg total) by mouth at bedtime. 60 capsule 11  . omeprazole (PRILOSEC) 40 MG capsule TAKE 1 CAPSULE (40 MG TOTAL) BY MOUTH DAILY. FOR ACID INDIGESTION AND REFLUX 30 capsule 5  . predniSONE (DELTASONE) 20 MG tablet TAKE 1 TABLET PER DAY AS NEEDED FOR HEADACHES. 90 tablet 1  . prochlorperazine (COMPAZINE) 5 MG tablet TAKE 1 TABLET BY MOUTH 3 TIMES A DAY AS NEEDED FOR NAUSEA 50 tablet 1  . PROVENTIL HFA 108 (90 BASE) MCG/ACT inhaler INHALE 2 PUFFS EVERY 4 HOURS AS NEEDED FOR ASTHMA RESCUE 6.7 Inhaler 0  . ranitidine (ZANTAC) 300 MG tablet Take 1 to 2 tablets daily for heartburn & reflux to allow wean and transition from PPI / Omeprazole 180 tablet 1  . Riboflavin 100 MG TABS Take 100 mg by mouth. bid    . rosuvastatin (CRESTOR) 40 MG tablet     . tiZANidine (ZANAFLEX) 2 MG tablet Take 1 tablet (2 mg total) by mouth every 6 (six) hours as needed for muscle spasms. 30 tablet 6  . Topiramate ER (TROKENDI XR) 200 MG CP24 Take 200 mg by mouth at bedtime. 30 capsule 11   . vitamin B-12 (CYANOCOBALAMIN) 1000 MCG tablet Take 1,000 mcg by mouth daily.    . Vitamin D, Ergocalciferol, (DRISDOL) 50000 units CAPS capsule TAKE 1 CAPSULE DAILY 30 capsule 1   No current facility-administered medications on file prior to visit.    Medical History:  Past Medical History:  Diagnosis Date  . Anemia   . Anxiety   . Asthma   . Chronic heartburn   . Depression   . Elevated cholesterol    on Crestor  . Headache(784.0)   . Hypertension   . Hypothyroidism    S/P thyroidectomy  . Migraine   . Occlusion and stenosis of carotid  artery with cerebral infarction   . Spasm of muscle   . Unspecified cerebral artery occlusion with cerebral infarction   . Vitamin D deficiency    Allergies:  Allergies  Allergen Reactions  . Penicillins Anaphylaxis and Hives  . Imitrex [Sumatriptan]   . Other Swelling    kiwi  . Sulfa Antibiotics Hives  . Zomig [Zolmitriptan]      Review of Systems:  Review of Systems  Constitutional: Positive for malaise/fatigue. Negative for chills, diaphoresis, fever and weight loss.  HENT: Negative for congestion, ear discharge, ear pain, hearing loss, nosebleeds, sore throat and tinnitus.   Eyes: Negative.   Respiratory: Negative for cough, hemoptysis, sputum production, shortness of breath, wheezing and stridor.   Cardiovascular: Negative.  Negative for chest pain and leg swelling.  Gastrointestinal: Negative.  Negative for blood in stool.  Genitourinary: Negative.  Negative for dysuria, frequency and urgency.  Musculoskeletal: Positive for back pain, joint pain and neck pain. Negative for falls and myalgias.  Skin: Negative.   Neurological: Positive for headaches. Negative for dizziness, tingling, tremors, sensory change, speech change, focal weakness, seizures, loss of consciousness and weakness.  Psychiatric/Behavioral: Positive for depression and memory loss. Negative for hallucinations, substance abuse and suicidal ideas. The patient is  nervous/anxious and has insomnia.     Family history- Review and unchanged Social history- Review and unchanged Physical Exam: BP (!) 140/92   Pulse 92   Temp 97.9 F (36.6 C)   Ht 5' 8.75" (1.746 m)   Wt 158 lb (71.7 kg)   SpO2 98%   BMI 23.50 kg/m  Wt Readings from Last 3 Encounters:  12/06/16 158 lb (71.7 kg)  10/05/16 156 lb 6.4 oz (70.9 kg)  07/20/16 158 lb 3.2 oz (71.8 kg)   General Appearance: Well nourished, in no apparent distress. Eyes: PERRLA, EOMs, conjunctiva no swelling or erythema, normal fundi and vessels. Sinuses: No frontal/maxillary tenderness ENT/Mouth: EACs patent / TMs  nl. Nares clear without erythema, swelling, mucoid exudates. Oral hygiene is good. No erythema, swelling, or exudate. Tongue normal, non-obstructing. Tonsils not swollen or erythematous. Hearing normal. + TMJ tenderness Neck: Supple, thyroid normal. No bruits, nodes or JVD. Respiratory: Respiratory effort normal.  BS equal and clear bilateral without rales, rhonci, wheezing or stridor. Cardio: Heart sounds are normal with regular rate and rhythm and no murmurs, rubs or gallops. Peripheral pulses are normal and equal bilaterally without edema. No aortic or femoral bruits. Chest: symmetric with normal excursions and percussion. Breasts: Symmetric, without lumps, nipple discharge, retractions, or fibrocystic changes.  Abdomen: Flat, soft, with bowl sounds. Nontender, no guarding, rebound, hernias, masses, or organomegaly.  Lymphatics: Non tender without lymphadenopathy.  Musculoskeletal:  Mild spastic Lt HP and flexion type contracturing of the Lt wrist and hand. Otherwise full ROM of the Rt extremities, with normal joint stability, strength on the Rt  and  gait is normal  Skin: Warm and dry without rashes, lesions, cyanosis, clubbing or  ecchymosis.  Neuro: Cranial nerves intact, reflexes slightly increased on the Left and muscle tone likewise sl. Increased on the Left. No cerebellar symptoms.  Sensation intact.  Pysch: Alert and oriented X 3, normal affect.    Quentin MullingAmanda Zanetta Dehaan, PA-C 11:33 AM [facility]

## 2016-12-06 ENCOUNTER — Ambulatory Visit (INDEPENDENT_AMBULATORY_CARE_PROVIDER_SITE_OTHER): Payer: BLUE CROSS/BLUE SHIELD | Admitting: Physician Assistant

## 2016-12-06 VITALS — BP 132/86 | HR 92 | Temp 97.9°F | Ht 68.75 in | Wt 158.0 lb

## 2016-12-06 DIAGNOSIS — R030 Elevated blood-pressure reading, without diagnosis of hypertension: Secondary | ICD-10-CM | POA: Diagnosis not present

## 2016-12-06 DIAGNOSIS — E559 Vitamin D deficiency, unspecified: Secondary | ICD-10-CM | POA: Diagnosis not present

## 2016-12-06 DIAGNOSIS — Z79899 Other long term (current) drug therapy: Secondary | ICD-10-CM | POA: Diagnosis not present

## 2016-12-06 DIAGNOSIS — I69954 Hemiplegia and hemiparesis following unspecified cerebrovascular disease affecting left non-dominant side: Secondary | ICD-10-CM

## 2016-12-06 DIAGNOSIS — I63031 Cerebral infarction due to thrombosis of right carotid artery: Secondary | ICD-10-CM | POA: Diagnosis not present

## 2016-12-06 DIAGNOSIS — E782 Mixed hyperlipidemia: Secondary | ICD-10-CM | POA: Diagnosis not present

## 2016-12-06 DIAGNOSIS — E890 Postprocedural hypothyroidism: Secondary | ICD-10-CM

## 2016-12-06 NOTE — Patient Instructions (Addendum)
Drink 80-100 oz a day of water, measure it out Eat 3 meals a day, have to do breakfast, eat protein- hard boiled eggs, protein bar like nature valley protein bar, greek yogurt like oikos triple zero, chobani 100, or light n fit greek Cut back on carbs  Follow up with GI doctor  Monitor your blood pressure at home. Go to the ER if any CP, SOB, nausea, dizziness, severe HA, changes vision/speech  Goal BP:  For patients younger than 60: Goal BP < 140/90. For patients 60 and older: Goal BP < 150/90. For patients with diabetes: Goal BP < 140/90. Your most recent BP: BP: 132/86 (recheck)   Take your medications faithfully as instructed. Maintain a healthy weight. Get at least 150 minutes of aerobic exercise per week. Minimize salt intake. Minimize alcohol intake  DASH Eating Plan DASH stands for "Dietary Approaches to Stop Hypertension." The DASH eating plan is a healthy eating plan that has been shown to reduce high blood pressure (hypertension). Additional health benefits may include reducing the risk of type 2 diabetes mellitus, heart disease, and stroke. The DASH eating plan may also help with weight loss. WHAT DO I NEED TO KNOW ABOUT THE DASH EATING PLAN? For the DASH eating plan, you will follow these general guidelines:  Choose foods with a percent daily value for sodium of less than 5% (as listed on the food label).  Use salt-free seasonings or herbs instead of table salt or sea salt.  Check with your health care provider or pharmacist before using salt substitutes.  Eat lower-sodium products, often labeled as "lower sodium" or "no salt added."  Eat fresh foods.  Eat more vegetables, fruits, and low-fat dairy products.  Choose whole grains. Look for the word "whole" as the first word in the ingredient list.  Choose fish and skinless chicken or turkey more Malawioften than red meat. Limit fish, poultry, and meat to 6 oz (170 g) each day.  Limit sweets, desserts, sugars, and sugary  drinks.  Choose heart-healthy fats.  Limit cheese to 1 oz (28 g) per day.  Eat more home-cooked food and less restaurant, buffet, and fast food.  Limit fried foods.  Cook foods using methods other than frying.  Limit canned vegetables. If you do use them, rinse them well to decrease the sodium.  When eating at a restaurant, ask that your food be prepared with less salt, or no salt if possible. WHAT FOODS CAN I EAT? Seek help from a dietitian for individual calorie needs. Grains Whole grain or whole wheat bread. Brown rice. Whole grain or whole wheat pasta. Quinoa, bulgur, and whole grain cereals. Low-sodium cereals. Corn or whole wheat flour tortillas. Whole grain cornbread. Whole grain crackers. Low-sodium crackers. Vegetables Fresh or frozen vegetables (raw, steamed, roasted, or grilled). Low-sodium or reduced-sodium tomato and vegetable juices. Low-sodium or reduced-sodium tomato sauce and paste. Low-sodium or reduced-sodium canned vegetables.  Fruits All fresh, canned (in natural juice), or frozen fruits. Meat and Other Protein Products Ground beef (85% or leaner), grass-fed beef, or beef trimmed of fat. Skinless chicken or Malaw[place]urkey. Ground chicken or [place]urkey. Pork trimmed of fat. All fish and seafood. Eggs. Dried beans, peas, or lentils. Unsalted nuts and seeds. Unsalted canned beans. Dairy Low-fat dairy products, such as skim or 1% milk, 2% or reduced-fat cheeses, low-fat ricotta or cottage cheese, or plain low-fat yogurt. Low-sodium or reduced-sodium cheeses. Fats and Oils Tub margarines without trans fats. Light or reduced-fat mayonnaise and salad dressings (reduced sodium). Avocado. Safflower,  olive, or canola oils. Natural peanut or almond butter. Other Unsalted popcorn and pretzels. The items listed above may not be a complete list of recommended foods or beverages. Contact your dietitian for more options. WHAT FOODS ARE NOT RECOMMENDED? Grains White bread. White pasta.  White rice. Refined cornbread. Bagels and croissants. Crackers that contain trans fat. Vegetables Creamed or fried vegetables. Vegetables in a cheese sauce. Regular canned vegetables. Regular canned tomato sauce and paste. Regular tomato and vegetable juices. Fruits Dried fruits. Canned fruit in light or heavy syrup. Fruit juice. Meat and Other Protein Products Fatty cuts of meat. Ribs, chicken wings, bacon, sausage, bologna, salami, chitterlings, fatback, hot dogs, bratwurst, and packaged luncheon meats. Salted nuts and seeds. Canned beans with salt. Dairy Whole or 2% milk, cream, half-and-half, and cream cheese. Whole-fat or sweetened yogurt. Full-fat cheeses or blue cheese. Nondairy creamers and whipped toppings. Processed cheese, cheese spreads, or cheese curds. Condiments Onion and garlic salt, seasoned salt, table salt, and sea salt. Canned and packaged gravies. Worcestershire sauce. Tartar sauce. Barbecue sauce. Teriyaki sauce. Soy sauce, including reduced sodium. Steak sauce. Fish sauce. Oyster sauce. Cocktail sauce. Horseradish. Ketchup and mustard. Meat flavorings and tenderizers. Bouillon cubes. Hot sauce. Tabasco sauce. Marinades. Taco seasonings. Relishes. Fats and Oils Butter, stick margarine, lard, shortening, ghee, and bacon fat. Coconut, palm kernel, or palm oils. Regular salad dressings. Other Pickles and olives. Salted popcorn and pretzels. The items listed above may not be a complete list of foods and beverages to avoid. Contact your dietitian for more information. WHERE CAN I FIND MORE INFORMATION? National Heart, Lung, and Blood Institute: travelstabloid.com Document Released: 03/04/2011 Document Revised: 07/30/2013 Document Reviewed: 01/17/2013 [facility] ([facility]) Patient Information 2015 [place], [place]. This information is not intended to replace advice given to you by your health care provider. Make sure you discuss any questions you have with your  health care provider.

## 2016-12-07 LAB — HEPATIC FUNCTION PANEL
AG Ratio: 2 (calc) (ref 1.0–2.5)
ALT: 28 U/L (ref 6–29)
AST: 20 U/L (ref 10–35)
Albumin: 4.7 g/dL (ref 3.6–5.1)
Alkaline phosphatase (APISO): 90 U/L (ref 33–130)
Bilirubin, Direct: 0.1 mg/dL (ref 0.0–0.2)
Globulin: 2.4 g/dL (calc) (ref 1.9–3.7)
Indirect Bilirubin: 0.5 mg/dL (calc) (ref 0.2–1.2)
Total Bilirubin: 0.6 mg/dL (ref 0.2–1.2)
Total Protein: 7.1 g/dL (ref 6.1–8.1)

## 2016-12-07 LAB — CBC WITH DIFFERENTIAL/PLATELET
Basophils Absolute: 30 cells/uL (ref 0–200)
Basophils Relative: 0.7 %
Eosinophils Absolute: 189 cells/uL (ref 15–500)
Eosinophils Relative: 4.4 %
HCT: 45.2 % — ABNORMAL HIGH (ref 35.0–45.0)
Hemoglobin: 15.8 g/dL — ABNORMAL HIGH (ref 11.7–15.5)
Lymphs Abs: 1342 cells/uL (ref 850–3900)
MCH: 31.7 pg (ref 27.0–33.0)
MCHC: 35 g/dL (ref 32.0–36.0)
MCV: 90.8 fL (ref 80.0–100.0)
MPV: 11.4 fL (ref 7.5–12.5)
Monocytes Relative: 7.9 %
Neutro Abs: 2399 cells/uL (ref 1500–7800)
Neutrophils Relative %: 55.8 %
Platelets: 208 10*3/uL (ref 140–400)
RBC: 4.98 10*6/uL (ref 3.80–5.10)
RDW: 12.9 % (ref 11.0–15.0)
Total Lymphocyte: 31.2 %
WBC mixed population: 340 cells/uL (ref 200–950)
WBC: 4.3 10*3/uL (ref 3.8–10.8)

## 2016-12-07 LAB — LIPID PANEL
Cholesterol: 197 mg/dL (ref <200)
HDL: 54 mg/dL (ref >50)
LDL Cholesterol (Calc): 107 mg/dL (calc) — ABNORMAL HIGH
Non-HDL Cholesterol (Calc): 143 mg/dL (calc) — ABNORMAL HIGH (ref <130)
Total CHOL/HDL Ratio: 3.6 (calc) (ref <5.0)
Triglycerides: 253 mg/dL — ABNORMAL HIGH (ref <150)

## 2016-12-07 LAB — BASIC METABOLIC PANEL WITH GFR
BUN: 17 mg/dL (ref 7–25)
CO2: 25 mmol/L (ref 20–32)
Calcium: 9.8 mg/dL (ref 8.6–10.4)
Chloride: 107 mmol/L (ref 98–110)
Creat: 0.97 mg/dL (ref 0.50–1.05)
GFR, Est African American: 75 mL/min/{1.73_m2} (ref >=60)
GFR, Est Non African American: 64 mL/min/{1.73_m2} (ref >=60)
Glucose, Bld: 84 mg/dL (ref 65–99)
Potassium: 4.2 mmol/L (ref 3.5–5.3)
Sodium: 139 mmol/L (ref 135–146)

## 2016-12-07 LAB — TSH: TSH: 0.68 mIU/L (ref 0.40–4.50)

## 2016-12-07 LAB — MAGNESIUM: Magnesium: 2.1 mg/dL (ref 1.5–2.5)

## 2016-12-10 ENCOUNTER — Encounter: Payer: Self-pay | Admitting: Neurology

## 2016-12-10 ENCOUNTER — Encounter: Payer: Self-pay | Admitting: Physician Assistant

## 2016-12-21 ENCOUNTER — Other Ambulatory Visit: Payer: Self-pay | Admitting: Physician Assistant

## 2016-12-21 MED ORDER — AMPHETAMINE-DEXTROAMPHETAMINE 10 MG PO TABS: ORAL_TABLET | ORAL | 0 refills | Status: DC

## 2016-12-29 ENCOUNTER — Other Ambulatory Visit: Payer: Self-pay | Admitting: Internal Medicine

## 2017-01-07 ENCOUNTER — Other Ambulatory Visit: Payer: Self-pay | Admitting: Internal Medicine

## 2017-01-13 ENCOUNTER — Other Ambulatory Visit: Payer: Self-pay | Admitting: Neurology

## 2017-01-20 ENCOUNTER — Ambulatory Visit (INDEPENDENT_AMBULATORY_CARE_PROVIDER_SITE_OTHER): Payer: BLUE CROSS/BLUE SHIELD | Admitting: Internal Medicine

## 2017-01-20 ENCOUNTER — Ambulatory Visit (INDEPENDENT_AMBULATORY_CARE_PROVIDER_SITE_OTHER): Payer: BLUE CROSS/BLUE SHIELD | Admitting: Orthopedic Surgery

## 2017-01-20 VITALS — BP 142/98 | HR 88 | Temp 97.7°F | Resp 18 | Ht 68.75 in | Wt 162.0 lb

## 2017-01-20 DIAGNOSIS — Z23 Encounter for immunization: Secondary | ICD-10-CM | POA: Diagnosis not present

## 2017-01-20 DIAGNOSIS — B029 Zoster without complications: Secondary | ICD-10-CM

## 2017-01-20 MED ORDER — ACYCLOVIR 800 MG PO TABS: ORAL_TABLET | ORAL | 0 refills | Status: AC

## 2017-01-20 MED ORDER — PREDNISONE 20 MG PO TABS: ORAL_TABLET | ORAL | 0 refills | Status: DC

## 2017-01-23 ENCOUNTER — Encounter: Payer: Self-pay | Admitting: Internal Medicine

## 2017-01-23 NOTE — Progress Notes (Signed)
Subjective:    Patient ID: Ruth Gregory, female    DOB: 05/04/1960, 56 y.o.   MRN: [id number]  HPI  This nice 56 yo MWF with hx/o HTN, HLD, Hypothyroidism, PreDM, And Rt MCA  CVA 2009) attributed to  Car Aa dissection from fibromuscular dysplasia presents with c/o Rt facial & eye pain x 2-3 days similar to a prior episode of Shingles. She describes burning dysthesias of the Rt face & eyelid. Denies any discrete neuro sx's.   Medication Sig  . amphetamine-dextroamphetamine (ADDERALL) 10 MG tablet Take 1/2 to 1 tablet 1 or 2 x daily for ADD  . aspirin 325 MG EC tablet Take 325 mg by mouth daily.  . Botulinum Toxin Type A (BOTOX) 200 UNITS SOLR Inject as directed. Change to BOTOX A 100  units  . buPROPion (WELLBUTRIN XL) 300 MG 24 hr tablet TAKE 1 TABLET (300 MG TOTAL) BY MOUTH DAILY. WITH FOOD  . butalbital-acetaminophen-caffeine (FIORICET, ESGIC) 50-325-40 MG tablet TAKE 1 TABLET BY MOUTH EVERY 6 HOURS AS NEEDED FOR HEADACHE  . CAMBIA 50 MG PACK TAKE 1 PACKET BY MOUTH AS NEEDED.  Marland Kitchen. cetirizine (ZYRTEC) 10 MG tablet Take 10 mg by mouth daily.  . Diclofenac Potassium (CAMBIA) 50 MG PACK Take one packet at onset of migraine.  May repeat once in two hours, if needed.  Max dose 2 packets per day.  #27/90days  . fenofibrate micronized (LOFIBRA) 134 MG capsule TAKE 1 CAPSULE EVERY DAY  . fluticasone furoate-vilanterol (BREO ELLIPTA) 100-25 MCG/INH AEPB Inhale 1 puff into the lungs daily. Rinse mouth with water after each use  . levothyroxine (SYNTHROID, LEVOTHROID) 100 MCG tablet TAKE 1 TABLET BY MOUTH EVERY DAY  . magnesium oxide (MAG-OX) 400 MG tablet Take 400 mg by mouth 2 (two) times daily.   . montelukast (SINGULAIR) 10 MG tablet Take 1 tablet (10 mg total) by mouth at bedtime.  . Multiple Vitamin (MULTIVITAMIN WITH MINERALS) TABS tablet Take 1 tablet by mouth daily.  . nortriptyline (PAMELOR) 25 MG capsule Take 2 capsules (50 mg total) by mouth at bedtime.  Marland Kitchen. omeprazole (PRILOSEC) 40 MG capsule TAKE 1  CAPSULE (40 MG TOTAL) BY MOUTH DAILY. FOR ACID INDIGESTION AND REFLUX  . predniSONE (DELTASONE) 20 MG tablet TAKE 1 TABLET PER DAY AS NEEDED FOR HEADACHES.  Marland Kitchen. prochlorperazine (COMPAZINE) 5 MG tablet TAKE 1 TABLET BY MOUTH 3 TIMES A DAY AS NEEDED FOR NAUSEA  . PROVENTIL HFA 108 (90 BASE) MCG/ACT inhaler INHALE 2 PUFFS EVERY 4 HOURS AS NEEDED FOR ASTHMA RESCUE  . Riboflavin 100 MG TABS Take 100 mg by mouth. bid  . rosuvastatin (CRESTOR) 40 MG tablet   . tiZANidine (ZANAFLEX) 2 MG tablet TAKE 1 TABLET (2 MG TOTAL) BY MOUTH EVERY 6 (SIX) HOURS AS NEEDED FOR MUSCLE SPASMS.  . Topiramate ER (TROKENDI XR) 200 MG CP24 Take 200 mg by mouth at bedtime.  . vitamin B-12 (CYANOCOBALAMIN) 1000 MCG tablet Take 1,000 mcg by mouth daily.  . Vitamin D, Ergocalciferol, (DRISDOL) 50000 units CAPS capsule TAKE 1 CAPSULE DAILY  . ESTRING 2 MG vaginal ring Place 2 mg vaginally every 3 (three) months.   . ranitidine (ZANTAC) 300 MG tablet Take 1 to 2 tablets daily for heartburn & reflux to allow wean and transition from PPI / Omeprazole   No facility-administered medications prior to visit.    Allergies  Allergen Reactions  . Penicillins Anaphylaxis and Hives  . Imitrex [Sumatriptan]   . Other Swelling    kiwi  .  Sulfa Antibiotics Hives  . Zomig [Zolmitriptan]     Past Medical History:  Diagnosis Date  . Anemia   . Anxiety   . Asthma   . Chronic heartburn   . Depression   . Elevated cholesterol    on Crestor  . Headache(784.0)   . Hypertension   . Hypothyroidism    S/P thyroidectomy  . Migraine   . Occlusion and stenosis of carotid artery with cerebral infarction   . Spasm of muscle   . Unspecified cerebral artery occlusion with cerebral infarction   . Vitamin D deficiency    Review of Systems  10 point systems review negative except as above.    Objective:   Physical Exam  BP (!) 142/98   Pulse 88   Temp 97.7 F (36.5 C)   Resp 18   Ht 5' 8.75" (1.746 m)   Wt 162 lb (73.5 kg)   BMI  24.10 kg/m   HEENT - Eac's patent. TM's Nl. EOM's full. PERRLA. NasoOroPharynx clear.  Neck - supple. Nl Thyroid. Carotids 2+ & No bruits, nodes, JVD Chest - Clear equal BS w/o Rales, rhonchi, wheezes. Cor - Nl HS. RRR w/o sig MGR. PP 1(+). No edema. Abd - No palpable organomegaly, masses or tenderness. BS nl. MS- Very mild residua Lt  hyper reflexia (U>L). Gait Nl. Neuro - No obvious Cr N abnormalities. Sensory and Cerebellar functions appear Nl w/o focal abnormalities.    Assessment & Plan:   1. Herpes zoster without complication  - Varicella zoster antibody, IgG - Varicella zoster antibody, IgM  - predniSONE (DELTASONE) 20 MG tablet; 1 tab 3 x day for 2 days, then 1 tab 2 x day for 2 days, then 1 tab 1 x day for 3 days  Dispense: 13 tablet; Refill: 0  - acyclovir (ZOVIRAX) 800 MG tablet; Take 1 tablet 3 x / day with meals & 2 tablets at Bedtime  Dispense: 50 tablet; Refill: 0  2. Need for immunization against influenza  - FLU VACCINE MDCK QUAD W/Preservative  - discussed n=meds & SE's . Pt to call if develops visual sx's or eye changes.

## 2017-01-23 NOTE — Patient Instructions (Signed)
Shingles Shingles, which is also known as herpes zoster, is an infection that causes a painful skin rash and fluid-filled blisters. Shingles is not related to genital herpes, which is a sexually transmitted infection. Shingles only develops in people who:  Have had chickenpox.  Have received the chickenpox vaccine. (This is rare.)  What are the causes? Shingles is caused by varicella-zoster virus (VZV). This is the same virus that causes chickenpox. After exposure to VZV, the virus stays in the body in an inactive (dormant) state. Shingles develops if the virus reactivates. This can happen many years after the initial exposure to VZV. It is not known what causes this virus to reactivate. What increases the risk? People who have had chickenpox or received the chickenpox vaccine are at risk for shingles. Infection is more common in people who:  Are older than age 50.  Have a weakened defense (immune) system, such as those with HIV, AIDS, or cancer.  Are taking medicines that weaken the immune system, such as transplant medicines.  Are under great stress.  What are the signs or symptoms? Early symptoms of this condition include itching, tingling, and pain in an area on your skin. Pain may be described as burning, stabbing, or throbbing. A few days or weeks after symptoms start, a painful red rash appears, usually on one side of the body in a bandlike or beltlike pattern. The rash eventually turns into fluid-filled blisters that break open, scab over, and dry up in about 2-3 weeks. At any time during the infection, you may also develop:  A fever.  Chills.  A headache.  An upset stomach.  How is this diagnosed? This condition is diagnosed with a skin exam. Sometimes, skin or fluid samples are taken from the blisters before a diagnosis is made. These samples are examined under a microscope or sent to a lab for testing. How is this treated? There is no specific cure for this condition.  Your health care provider will probably prescribe medicines to help you manage pain, recover more quickly, and avoid long-term problems. Medicines may include:  Antiviral drugs.  Anti-inflammatory drugs.  Pain medicines.  If the area involved is on your face, you may be referred to a specialist, such as an eye doctor (ophthalmologist) or an ear, nose, and throat (ENT) doctor to help you avoid eye problems, chronic pain, or disability. Follow these instructions at home: Medicines  Take medicines only as directed by your health care provider.  Apply an anti-itch or numbing cream to the affected area as directed by your health care provider. Blister and Rash Care  Take a cool bath or apply cool compresses to the area of the rash or blisters as directed by your health care provider. This may help with pain and itching.  Keep your rash covered with a loose bandage (dressing). Wear loose-fitting clothing to help ease the pain of material rubbing against the rash.  Keep your rash and blisters clean with mild soap and cool water or as directed by your health care provider.  Check your rash every day for signs of infection. These include redness, swelling, and pain that lasts or increases.  Do not pick your blisters.  Do not scratch your rash. General instructions  Rest as directed by your health care provider.  Keep all follow-up visits as directed by your health care provider. This is important.  Until your blisters scab over, your infection can cause chickenpox in people who have never had it or been vaccinated   against it. To prevent this from happening, avoid contact with other people, especially: ? Babies. ? Pregnant women. ? Children who have eczema. ? Elderly people who have transplants. ? People who have chronic illnesses, such as leukemia or AIDS. Contact a health care provider if:  Your pain is not relieved with prescribed medicines.  Your pain does not get better after  the rash heals.  Your rash looks infected. Signs of infection include redness, swelling, and pain that lasts or increases. Get help right away if:  The rash is on your face or nose.  You have facial pain, pain around your eye area, or loss of feeling on one side of your face.  You have ear pain or you have ringing in your ear.  You have loss of taste.  Your condition gets worse. ++++++++++++++++++++++++++++++++++++++++++++++++++++++++++++++++++++++++  Postherpetic Neuralgia Postherpetic neuralgia (PHN) is nerve pain that occurs after a shingles infection. Shingles is a painful rash that appears on one side of the body, usually on your trunk or face. Shingles is caused by the varicella-zoster virus. This is the same virus that causes chickenpox. In people who have had chickenpox, the virus can resurface years later and cause shingles. You may have PHN if you continue to have pain for 3 months after your shingles rash has gone away. PHN appears in the same area where you had the shingles rash. For most people, PHN goes away within 1 year. Getting a vaccination for shingles can prevent PHN. This vaccine is recommended for people older than 50. It may prevent shingles and may also lower your risk of PHN if you do get shingles. What are the causes? PHN is caused by damage to your nerves from the varicella-zoster virus. This damage makes your nerves overly sensitive. What increases the risk? Aging is the biggest risk factor for developing PHN. Most people who get PHN are older than 60. Other risk factors include:  Having very bad pain before your shingles rash starts.  Having a very bad rash.  Having shingles in the nerve that supplies your face and eye (trigeminal nerve).  What are the signs or symptoms? Pain is the main symptom of PHN. The pain is often very bad and may be described as stabbing, burning, or feeling like an electric shock. The pain may come and go or may be there all the  time. Pain may be triggered by light touches on the skin or changes in temperature. You may have itching along with the pain. How is this diagnosed? Your health care provider may diagnose PHN based on your symptoms and your history of shingles. Lab studies and other diagnostic tests are usually not needed. How is this treated? There is no cure for PHN. Treatment for PHN will focus on pain relief. Over-the-counter pain relievers do not usually relieve PHN pain. You may need to work with a pain specialist. Treatment may include:  Antidepressant medicines to help with pain and improve sleep.  Antiseizure medicines to relieve nerve pain.  Strong pain relievers (opioids).  A numbing patch worn on the skin (lidocaine patch).  Follow these instructions at home: It may take a long time to recover from PHN. Work closely with your health care provider, and have a good support system at home.  Take all medicines as directed by your health care provider.  Wear loose, comfortable clothing.  Cover sensitive areas with a dressing to reduce friction from clothing rubbing on the area.  If cold does not make your  pain worse, try applying a cool compress or cooling gel pack to the area.  Talk to your health care provider if you feel depressed or desperate. Living with long-term pain can be depressing.  Contact a health care provider if:  Your medicine is not helping.  You are struggling to manage your pain at home. This information is not intended to replace advice given to you by your health care provider. Make sure you discuss any questions you have with your health care provider. Document Released: 06/05/2002 Document Revised: 08/21/2015 Document Reviewed: 03/06/2013 Elsevier Interactive Patient Education  [employer].

## 2017-01-25 LAB — VARICELLA ZOSTER ANTIBODY, IGM: Varicella Zoster Ab IgM: <=0.9 (ref <=0.90)

## 2017-01-25 LAB — VARICELLA ZOSTER ANTIBODY, IGG: Varicella IgG: 1397 index

## 2017-01-26 ENCOUNTER — Ambulatory Visit (INDEPENDENT_AMBULATORY_CARE_PROVIDER_SITE_OTHER): Payer: BLUE CROSS/BLUE SHIELD

## 2017-01-26 ENCOUNTER — Encounter (INDEPENDENT_AMBULATORY_CARE_PROVIDER_SITE_OTHER): Payer: Self-pay | Admitting: Orthopedic Surgery

## 2017-01-26 ENCOUNTER — Ambulatory Visit (INDEPENDENT_AMBULATORY_CARE_PROVIDER_SITE_OTHER): Payer: BLUE CROSS/BLUE SHIELD | Admitting: Orthopedic Surgery

## 2017-01-26 DIAGNOSIS — M79641 Pain in right hand: Secondary | ICD-10-CM

## 2017-01-26 NOTE — Progress Notes (Signed)
Office Visit Note   Patient: Ruth Gregory           Date of Birth: 01/02/1961           MRN: [id number] Visit Date: 01/26/2017 Requested by: Ruth Gregory, William, MD [address] [place] [id number] PCP: Ruth Gregory, William, MD  Subjective: Chief Complaint  Patient presents with  . Right Hand - Pain    HPI: Ruth Gregory is a patient with right hand pain.  Reports mostly pain in her thumb thenar region.  She's had pain for several months.  Denies history of injury.  It's worse with writing or any real activity with the hand.  She has been doing a lot of thumb work with her phone.  She has had a stroke which has affected her left hand.  She takes ibuprofen as needed.  She is wearing a sleeve which helps.              ROS: All systems reviewed are negative as they relate to the chief complaint within the history of present illness.  Patient denies  fevers or chills.   Assessment & Plan: Visit Diagnoses:  1. Pain in right hand     Plan: Impression is atypical right thumb pain.  Grind [facility] and her pinch strength is good.  There is no triggering of the thumb.  This could be overuse or tendinitis.  I would favor another 4 weeks of observation and conservative treatment with 4 week return in decision for or against MRI scanning at that time.  Radiographs negative today.  Follow-Up Instructions: Return in about 4 weeks (around 02/23/2017).   Orders:  Orders Placed This Encounter  Procedures  . XR Hand Complete Right   No orders of the defined types were placed in this encounter.     Procedures: No procedures performed   Clinical Data: No additional findings.  Objective: Vital Signs: There were no vitals taken for this visit.  Physical Exam:   Constitutional: Patient appears well-developed HEENT:  Head: Normocephalic Eyes:EOM are normal Neck: Normal range of motion Cardiovascular: Normal rate Pulmonary/chest: Effort normal Neurologic:  Patient is alert Skin: Skin is warm Psychiatric: Patient has normal mood and affect    Ortho Exam: Orthopedic exam demonstrates intact EPL FPL function.  There is tenderness in the thenar eminence.  There is some atrophy of the left thenar eminence compared to the right.  Negative grind test CMC joint right hand.  Wrist range of motion full with no tenderness over the first dorsal compartment.  No snuffbox tenderness.  Hand is perfused and sensate.  No other masses lymph adenopathy or skin changes noted in the hand/wrist region  Specialty Comments:  No specialty comments available.  Imaging: Xr Hand Complete Right  Result Date: 01/26/2017 AP lateral oblique right hand reviewed.  No fracture or dislocation is seen.  Minimal degenerative change noted in the radiocarpal joints.  Impression is normal right hand    PMFS History: Patient Active Problem List   Diagnosis Date Noted  . Acute pain of right knee 05/24/2016  . Hypothyroidism 07/23/2015  . Fatigue 04/08/2014  . Elevated blood pressure reading without diagnosis of hypertension 10/03/2013  . Vitamin D deficiency 10/03/2013  . Medication management 10/03/2013  . Prediabetes 10/03/2013  . ADD (attention deficit disorder) 10/03/2013  . Mixed hyperlipidemia 04/04/2013  . Sinusitis, chronic 04/04/2013  . Migraine 09/06/2012  . Spastic hemiplegia affecting left nondominant side (HCC) 09/06/2012  . CVA (  cerebral vascular accident) [facility])    Past Medical History:  Diagnosis Date  . Anemia   . Anxiety   . Asthma   . Chronic heartburn   . Depression   . Elevated cholesterol    on Crestor  . Headache(784.0)   . Hypertension   . Hypothyroidism    S/P thyroidectomy  . Migraine   . Occlusion and stenosis of carotid artery with cerebral infarction   . Spasm of muscle   . Unspecified cerebral artery occlusion with cerebral infarction   . Vitamin D deficiency     Family History  Problem Relation Age of Onset  . Heart disease  Mother   . Peptic Ulcer Disease Mother   . Heart disease Father   . Parkinson's disease Father     Past Surgical History:  Procedure Laterality Date  . AUGMENTATION MAMMAPLASTY Bilateral 2000  . COSMETIC SURGERY    . CYSTOCELE REPAIR N/A 01/08/2013   Procedure: ANTERIOR REPAIR (CYSTOCELE);  Surgeon: Meriel Pica, MD;  Location: [facility];  Service: Gynecology;  Laterality: N/A;  . NASAL SINUS SURGERY    . VAGINAL HYSTERECTOMY     Social History   Occupational History  . DESIGNER Self   Social History Main Topics  . Smoking status: Never Smoker  . Smokeless tobacco: Never Used  . Alcohol use 0.0 oz/week     Comment: occassionally drinks a glass of wine  . Drug use: No  . Sexual activity: Not on file

## 2017-01-27 ENCOUNTER — Other Ambulatory Visit: Payer: Self-pay | Admitting: Internal Medicine

## 2017-01-27 NOTE — Telephone Encounter (Signed)
Please call Alpraz  

## 2017-01-31 ENCOUNTER — Other Ambulatory Visit: Payer: Self-pay | Admitting: Internal Medicine

## 2017-01-31 MED ORDER — FLUTICASONE FUROATE-VILANTEROL 100-25 MCG/INH IN AEPB: INHALATION_SPRAY | RESPIRATORY_TRACT | 3 refills | Status: DC

## 2017-01-31 MED ORDER — ALBUTEROL SULFATE HFA 108 (90 BASE) MCG/ACT IN AERS: INHALATION_SPRAY | RESPIRATORY_TRACT | 3 refills | Status: DC

## 2017-02-04 DIAGNOSIS — Z1211 Encounter for screening for malignant neoplasm of colon: Secondary | ICD-10-CM | POA: Diagnosis not present

## 2017-02-04 DIAGNOSIS — K219 Gastro-esophageal reflux disease without esophagitis: Secondary | ICD-10-CM | POA: Diagnosis not present

## 2017-02-04 DIAGNOSIS — R197 Diarrhea, unspecified: Secondary | ICD-10-CM | POA: Diagnosis not present

## 2017-02-04 DIAGNOSIS — Z79899 Other long term (current) drug therapy: Secondary | ICD-10-CM | POA: Diagnosis not present

## 2017-02-15 ENCOUNTER — Other Ambulatory Visit: Payer: Self-pay | Admitting: Internal Medicine

## 2017-02-15 ENCOUNTER — Telehealth: Payer: Self-pay | Admitting: Physician Assistant

## 2017-02-15 MED ORDER — AMPHETAMINE-DEXTROAMPHETAMINE 10 MG PO TABS: ORAL_TABLET | ORAL | 0 refills | Status: DC

## 2017-02-15 NOTE — Telephone Encounter (Signed)
-----   Message from Gregery NaAngela D Duff, CMA sent at 02/14/2017  4:38 PM EST ----- Regarding: refill Refill on Adderall 10mg s

## 2017-02-15 NOTE — Telephone Encounter (Signed)
Lvm for pick up meds.

## 2017-02-21 ENCOUNTER — Telehealth: Payer: Self-pay | Admitting: Neurology

## 2017-02-21 NOTE — Telephone Encounter (Signed)
Left message requesting a return call.

## 2017-02-21 NOTE — Telephone Encounter (Signed)
Returned call to Ruth BattenKim - feels her spasms are some worse but feels it is partially due to the stress of the holidays.  I offered an earlier appt but she declined.  Stated she would like to wait and further discuss with Dr. Terrace ArabiaYan at her appt on 03/09/17.

## 2017-02-21 NOTE — Telephone Encounter (Signed)
Pt is wanting to schedule her next Botox sometime within the month of December, please call

## 2017-02-21 NOTE — Telephone Encounter (Signed)
I called and scheduled the patient for her Botox injection. She is experiencing some nerve/muscle pain and issues and would like to speak with someone regarding this. Please call and advise.

## 2017-02-24 ENCOUNTER — Ambulatory Visit (INDEPENDENT_AMBULATORY_CARE_PROVIDER_SITE_OTHER): Payer: BLUE CROSS/BLUE SHIELD | Admitting: Orthopedic Surgery

## 2017-02-24 ENCOUNTER — Encounter (INDEPENDENT_AMBULATORY_CARE_PROVIDER_SITE_OTHER): Payer: Self-pay | Admitting: Orthopedic Surgery

## 2017-02-24 DIAGNOSIS — M79644 Pain in right finger(s): Secondary | ICD-10-CM | POA: Diagnosis not present

## 2017-02-24 MED ORDER — DICLOFENAC SODIUM 2 % TD SOLN: 1.0000 | Freq: Two times a day (BID) | TRANSDERMAL | 0 refills | Status: DC

## 2017-02-25 ENCOUNTER — Ambulatory Visit (INDEPENDENT_AMBULATORY_CARE_PROVIDER_SITE_OTHER): Payer: BLUE CROSS/BLUE SHIELD | Admitting: Physician Assistant

## 2017-02-25 ENCOUNTER — Encounter: Payer: Self-pay | Admitting: Physician Assistant

## 2017-02-25 VITALS — BP 124/86 | Temp 97.4°F | Resp 16 | Ht 68.75 in | Wt 163.6 lb

## 2017-02-25 DIAGNOSIS — G629 Polyneuropathy, unspecified: Secondary | ICD-10-CM | POA: Diagnosis not present

## 2017-02-25 NOTE — Patient Instructions (Addendum)
Can restart on xanax to see if the withdrawal is contributing to the numbness/tingling  Talk with your neurologist about the trokendi, may need to decrease or adjust dose  Will check some labs   Thumb spicca splint at night    Peripheral Neuropathy Peripheral neuropathy is a type of nerve damage. It affects nerves that carry signals between the spinal cord and other parts of the body. These are called peripheral nerves. With peripheral neuropathy, one nerve or a group of nerves may be damaged. What are the causes? Many things can damage peripheral nerves. For some people with peripheral neuropathy, the cause is unknown. Some causes include:  Diabetes. This is the most common cause of peripheral neuropathy.  Injury to a nerve.  Pressure or stress on a nerve that lasts a long time.  Too little vitamin B. Alcoholism can lead to this.  Infections.  Autoimmune diseases, such as multiple sclerosis and systemic lupus erythematosus.  Inherited nerve diseases.  Some medicines, such as cancer drugs.  Toxic substances, such as lead and mercury.  Too little blood flowing to the legs.  Kidney disease.  Thyroid disease.  What are the signs or symptoms? Different people have different symptoms. The symptoms you have will depend on which of your nerves is damaged. Common symptoms include:  Loss of feeling (numbness) in the feet and hands.  Tingling in the feet and hands.  Pain that burns.  Very sensitive skin.  Weakness.  Not being able to move a part of the body (paralysis).  Muscle twitching.  Clumsiness or poor coordination.  Loss of balance.  Not being able to control your bladder.  Feeling dizzy.  Sexual problems.  How is this diagnosed? Peripheral neuropathy is a symptom, not a disease. Finding the cause of peripheral neuropathy can be hard. To figure that out, your health care provider will take a medical history and do a physical exam. A neurological exam  will also be done. This involves checking things affected by your brain, spinal cord, and nerves (nervous system). For example, your health care provider will check your reflexes, how you move, and what you can feel. Other types of tests may also be ordered, such as:  Blood tests.  A test of the fluid in your spinal cord.  Imaging tests, such as CT scans or an MRI.  Electromyography (EMG). This test checks the nerves that control muscles.  Nerve conduction velocity tests. These tests check how fast messages pass through your nerves.  Nerve biopsy. A small piece of nerve is removed. It is then checked under a microscope.  How is this treated?  Medicine is often used to treat peripheral neuropathy. Medicines may include: ? Pain-relieving medicines. Prescription or over-the-counter medicine may be suggested. ? Antiseizure medicine. This may be used for pain. ? Antidepressants. These also may help ease pain from neuropathy. ? Lidocaine. This is a numbing medicine. You might wear a patch or be given a shot. ? Mexiletine. This medicine is typically used to help control irregular heart rhythms.  Surgery. Surgery may be needed to relieve pressure on a nerve or to destroy a nerve that is causing pain.  Physical therapy to help movement.  Assistive devices to help movement. Follow these instructions at home:  Only take over-the-counter or prescription medicines as directed by your health care provider. Follow the instructions carefully for any given medicines. Do not take any other medicines without first getting approval from your health care provider.  If you have diabetes,  work closely with your health care provider to keep your blood sugar under control.  If you have numbness in your feet: ? Check every day for signs of injury or infection. Watch for redness, warmth, and swelling. ? Wear padded socks and comfortable shoes. These help protect your feet.  Do not do things that put  pressure on your damaged nerve.  Do not smoke. Smoking keeps blood from getting to damaged nerves.  Avoid or limit alcohol. Too much alcohol can cause a lack of B vitamins. These vitamins are needed for healthy nerves.  Develop a good support system. Coping with peripheral neuropathy can be stressful. Talk to a mental health specialist or join a support group if you are struggling.  Follow up with your health care provider as directed. Contact a health care provider if:  You have new signs or symptoms of peripheral neuropathy.  You are struggling emotionally from dealing with peripheral neuropathy.  You have a fever. Get help right away if:  You have an injury or infection that is not healing.  You feel very dizzy or begin vomiting.  You have chest pain.  You have trouble breathing. This information is not intended to replace advice given to you by your health care provider. Make sure you discuss any questions you have with your health care provider. Document Released: 03/05/2002 Document Revised: 08/21/2015 Document Reviewed: 11/20/2012 Elsevier Interactive Patient Education  2017 [employer]

## 2017-02-25 NOTE — Progress Notes (Signed)
Subjective:    Patient ID: Ruth Gregory, female    DOB: 10/11/1960, 56 y.o.   MRN: [id number]  HPI 56 y.o. WF has history of CVA (cerebral vascular accident) (HCC); Migraine; Spastic hemiplegia affecting left nondominant side (HCC); presents with bilateral hand/feet numbness x 2 weeks. She is on several medications, had recent [facility] and increase to 200mg  2 months ago. She has also been out of her xanax x 2 weeks, could not find the bottle, which coincides with the symptoms. She has also been taking more of the zanaflex since she has been out of the xanax.  She is following with ortho for right 1st MCP pain, getting MRI.   Lab Results  Component Value Date   VITAMINB12 695 05/18/2016   Lab Results  Component Value Date   IRON 133 05/18/2016   TIBC 277 05/18/2016    Blood pressure 124/86, temperature (!) 97.4 F (36.3 C), resp. rate 16, height 5' 8.75" (1.746 m), weight 163 lb 9.6 oz (74.2 kg), SpO2 95 %.  Medications Current Outpatient Medications on File Prior to Visit  Medication Sig  . albuterol (PROVENTIL HFA) 108 (90 Base) MCG/ACT inhaler 1 to 2 inhalations 10-15 minutes apart every 4 hours if needed for asthma rescue  . ALPRAZolam (XANAX) 1 MG tablet Take 1/2 to 1 tablet 2 to 3 x / day and please try to limit to 5 days /week to avoid addiction  . amphetamine-dextroamphetamine (ADDERALL) 10 MG tablet Take 1/2 to 1 tablet 1 or 2 x daily for ADD  . aspirin 325 MG EC tablet Take 325 mg by mouth daily.  . Botulinum Toxin Type A (BOTOX) 200 UNITS SOLR Inject as directed. Change to BOTOX A 100  units  . buPROPion (WELLBUTRIN XL) 300 MG 24 hr tablet TAKE 1 TABLET (300 MG TOTAL) BY MOUTH DAILY. WITH FOOD  . butalbital-acetaminophen-caffeine (FIORICET, ESGIC) 50-325-40 MG tablet TAKE 1 TABLET BY MOUTH EVERY 6 HOURS AS NEEDED FOR HEADACHE  . CAMBIA 50 MG PACK TAKE 1 PACKET BY MOUTH AS NEEDED.  Marland Kitchen. cetirizine (ZYRTEC) 10 MG tablet Take 10 mg by mouth daily.  .  Diclofenac Potassium (CAMBIA) 50 MG PACK Take one packet at onset of migraine.  May repeat once in two hours, if needed.  Max dose 2 packets per day.  #27/90days  . Diclofenac Sodium (PENNSAID) 2 % SOLN Place 1 Squirt onto the skin 2 (two) times daily.  . fenofibrate micronized (LOFIBRA) 134 MG capsule TAKE 1 CAPSULE EVERY DAY  . fluticasone furoate-vilanterol (BREO ELLIPTA) 100-25 MCG/INH AEPB 1 Inhalation daily to prevent Asthma  . levothyroxine (SYNTHROID, LEVOTHROID) 100 MCG tablet TAKE 1 TABLET BY MOUTH EVERY DAY  . magnesium oxide (MAG-OX) 400 MG tablet Take 400 mg by mouth 2 (two) times daily.   . montelukast (SINGULAIR) 10 MG tablet Take 1 tablet (10 mg total) by mouth at bedtime.  . Multiple Vitamin (MULTIVITAMIN WITH MINERALS) TABS tablet Take 1 tablet by mouth daily.  . nortriptyline (PAMELOR) 25 MG capsule Take 2 capsules (50 mg total) by mouth at bedtime.  Marland Kitchen. omeprazole (PRILOSEC) 40 MG capsule TAKE 1 CAPSULE (40 MG TOTAL) BY MOUTH DAILY. FOR ACID INDIGESTION AND REFLUX  . predniSONE (DELTASONE) 20 MG tablet TAKE 1 TABLET PER DAY AS NEEDED FOR HEADACHES.  Marland Kitchen. prochlorperazine (COMPAZINE) 5 MG tablet TAKE 1 TABLET BY MOUTH 3 TIMES A DAY AS NEEDED FOR NAUSEA  . Riboflavin 100 MG TABS Take 100 mg by mouth. bid  .  rosuvastatin (CRESTOR) 40 MG tablet   . tiZANidine (ZANAFLEX) 2 MG tablet TAKE 1 TABLET (2 MG TOTAL) BY MOUTH EVERY 6 (SIX) HOURS AS NEEDED FOR MUSCLE SPASMS.  . Topiramate ER (TROKENDI XR) 200 MG CP24 Take 200 mg by mouth at bedtime.  . vitamin B-12 (CYANOCOBALAMIN) 1000 MCG tablet Take 1,000 mcg by mouth daily.  . Vitamin D, Ergocalciferol, (DRISDOL) 50000 units CAPS capsule TAKE 1 CAPSULE DAILY   No current facility-administered medications on file prior to visit.     Problem list She has CVA (cerebral vascular accident) (HCC); Migraine; Spastic hemiplegia affecting left nondominant side (HCC); Mixed hyperlipidemia; Sinusitis, chronic; Elevated blood pressure reading without  diagnosis of hypertension; Vitamin D deficiency; Medication management; Prediabetes; ADD (attention deficit disorder); Fatigue; Hypothyroidism; and Acute pain of right knee on their problem list.   Review of Systems  Constitutional: Negative for chills, diaphoresis and fever.  HENT: Negative for congestion, ear discharge, ear pain, hearing loss, nosebleeds, sore throat and tinnitus.   Eyes: Negative.   Respiratory: Negative for cough, shortness of breath, wheezing and stridor.   Cardiovascular: Negative.  Negative for chest pain and leg swelling.  Gastrointestinal: Negative for abdominal distention, abdominal pain, anal bleeding, blood in stool, constipation, diarrhea, nausea, rectal pain and vomiting.  Genitourinary: Negative for dysuria, frequency and urgency.  Musculoskeletal: Positive for neck pain. Negative for back pain and myalgias.  Skin: Negative.   Neurological: Positive for numbness and headaches. Negative for dizziness, tremors, seizures and weakness.  Psychiatric/Behavioral: Negative for hallucinations and suicidal ideas. The patient is nervous/anxious.        Objective:   Physical Exam General Appearance: Well nourished, in no apparent distress. Eyes: PERRLA, EOMs, conjunctiva no swelling or erythema, normal fundi and vessels. Sinuses: No frontal/maxillary tenderness ENT/Mouth: EACs patent / TMs  nl. Nares clear without erythema, swelling, mucoid exudates. Oral hygiene is good. No erythema, swelling, or exudate. Tongue normal, non-obstructing. Tonsils not swollen or erythematous. Hearing normal. + TMJ tenderness Neck: Supple, thyroid normal. No bruits, nodes or JVD. Respiratory: Respiratory effort normal.  BS equal and clear bilateral without rales, rhonci, wheezing or stridor. Cardio: Heart sounds are normal with regular rate and rhythm and no murmurs, rubs or gallops. Peripheral pulses are normal and equal bilaterally without edema. No aortic or femoral bruits. Chest:  symmetric with normal excursions and percussion. Lymphatics: Non tender without lymphadenopathy.  Musculoskeletal:  Mild spastic Lt HP and flexion type contracturing of the Lt wrist and hand. Otherwise full ROM of the Rt extremities, + FINKELSTEIN TEST OF RIGHT THUMB, with normal joint stability, strength lower extremites normal, good reflexes, no decreased sensation.  Skin: Warm and dry without rashes, lesions, cyanosis, clubbing or  ecchymosis.  Neuro: Cranial nerves intact, reflexes slightly increased on the Left and muscle tone likewise sl. Increased on the Left. No cerebellar symptoms. Sensation intact.  Pysch: Alert and oriented X 3, normal affect.      Assessment & Plan:   Neuropathy ? From medications, discuss with neuro Normal physical exam- no neuropathy detected -     CBC with Differential/Platelet -     BASIC METABOLIC PANEL WITH GFR -     Hepatic function panel -     TSH -     Iron,Total/Total Iron Binding Cap -     Vitamin B12 -     Vitamin B1

## 2017-02-27 NOTE — Progress Notes (Signed)
Office Visit Note   Patient: Ruth Gregory           Date of Birth: 12/27/1960           MRN: [id number] Visit Date: 02/24/2017 Requested by: Lucky CowboyMcKeown, William, MD [address] [place], [place] [id number] PCP: Lucky CowboyMcKeown, William, MD  Subjective: Chief Complaint  Patient presents with  . Right Thumb - Pain    HPI: Ruth BattenKim is a 56 year old patient with right thumb pain.  Localizes the pain in the thenar region.  Has some occasional paresthesias.  Mostly pain with functional activities.  Does not necessarily localized to the [facility] region.  He has not had an injury to the thumb but she does report pain on a daily basis.  Taking over-the-counter medications without much relief.              ROS: All systems reviewed are negative as they relate to the chief complaint within the history of present illness.  Patient denies  fevers or chills.   Assessment & Plan: Visit Diagnoses:  1. Pain of right thumb     Plan: Impression is right thumb pain which may be atypical CMC arthritis or some other type of structural problem several months.  Occult CMC arthritis which on plain radiographs is normal versus some other type of structural problem in the thenar region of the thumb.  Follow-Up Instructions: Return for after MRI.   Orders:  Orders Placed This Encounter  Procedures  . MR FINGERS RICHT WO CONTRAST   Meds ordered this encounter  Medications  . Diclofenac Sodium (PENNSAID) 2 % SOLN    Sig: Place 1 Squirt onto the skin 2 (two) times daily.    Dispense:  1 Bottle    Refill:  0      Procedures: No procedures performed   Clinical Data: No additional findings.  Objective: Vital Signs: There were no vitals taken for this visit.  Physical Exam:  Constitutional: Patient appears well-developed HEENT:  Head: Normocephalic Eyes:EOM are normal Neck: Normal range of motion Cardiovascular: Normal rate Pulmonary/chest: Effort normal Neurologic: Patient is alert Skin: Skin is  warm Psychiatric: Patient has normal mood and affect    Ortho Exam: Orthopedic exam demonstrates intact EPL FPL function.  Collaterals are stable.  Extensor mechanism is intact in the thumb region.  Grind test negative for [facility] arthritis.  No first dorsal compartment tenderness or snuffbox tenderness noted in the right hand.  Wrist range of motion is full and radial pulses palpable.  No other masses lymphadenopathy or skin changes noted in the thumb region.  IP CMC MCP range of motion is full  Specialty Comments:  No specialty comments available.  Imaging: No results found.   PMFS History: Patient Active Problem List   Diagnosis Date Noted  . Acute pain of right knee 05/24/2016  . Hypothyroidism 07/23/2015  . Fatigue 04/08/2014  . Elevated blood pressure reading without diagnosis of hypertension 10/03/2013  . Vitamin D deficiency 10/03/2013  . Medication management 10/03/2013  . Prediabetes 10/03/2013  . ADD (attention deficit disorder) 10/03/2013  . Mixed hyperlipidemia 04/04/2013  . Sinusitis, chronic 04/04/2013  . Migraine 09/06/2012  . Spastic hemiplegia affecting left nondominant side (HCC) 09/06/2012  . CVA (cerebral vascular accident) [facility](HCC)    Past Medical History:  Diagnosis Date  . Anemia   . Anxiety   . Asthma   . Chronic heartburn   . Depression   . Elevated cholesterol    on Crestor  .  Headache(784.0)   . Hypertension   . Hypothyroidism    S/P thyroidectomy  . Migraine   . Occlusion and stenosis of carotid artery with cerebral infarction   . Spasm of muscle   . Unspecified cerebral artery occlusion with cerebral infarction   . Vitamin D deficiency     Family History  Problem Relation Age of Onset  . Heart disease Mother   . Peptic Ulcer Disease Mother   . Heart disease Father   . Parkinson's disease Father     Past Surgical History:  Procedure Laterality Date  . AUGMENTATION MAMMAPLASTY Bilateral 2000  . COSMETIC SURGERY    . CYSTOCELE REPAIR  N/A 01/08/2013   Procedure: ANTERIOR REPAIR (CYSTOCELE);  Surgeon: Meriel Picaichard M Holland, MD;  Location: [facility];  Service: Gynecology;  Laterality: N/A;  . NASAL SINUS SURGERY    . VAGINAL HYSTERECTOMY     Social History   Occupational History  . Occupation: DESIGNER    Employer: [employer]  Tobacco Use  . Smoking status: Never Smoker  . Smokeless tobacco: Never Used  Substance and Sexual Activity  . Alcohol use: Yes    Alcohol/week: 0.0 oz    Comment: occassionally drinks a glass of wine  . Drug use: No  . Sexual activity: Not on file

## 2017-03-01 NOTE — Telephone Encounter (Signed)
Dr. Terrace ArabiaYan are we increasing to 400 units and also are any additional injection codes needed?

## 2017-03-02 ENCOUNTER — Telehealth: Payer: Self-pay | Admitting: Neurology

## 2017-03-02 LAB — CBC WITH DIFFERENTIAL/PLATELET
Basophils Absolute: 21 cells/uL (ref 0–200)
Basophils Relative: 0.3 %
Eosinophils Absolute: 149 cells/uL (ref 15–500)
Eosinophils Relative: 2.1 %
HCT: 44.6 % (ref 35.0–45.0)
Hemoglobin: 15.8 g/dL — ABNORMAL HIGH (ref 11.7–15.5)
Lymphs Abs: 1392 cells/uL (ref 850–3900)
MCH: 32.6 pg (ref 27.0–33.0)
MCHC: 35.4 g/dL (ref 32.0–36.0)
MCV: 92 fL (ref 80.0–100.0)
MPV: 11.6 fL (ref 7.5–12.5)
Monocytes Relative: 5.5 %
Neutro Abs: 5148 cells/uL (ref 1500–7800)
Neutrophils Relative %: 72.5 %
Platelets: 193 10*3/uL (ref 140–400)
RBC: 4.85 10*6/uL (ref 3.80–5.10)
RDW: 12.6 % (ref 11.0–15.0)
Total Lymphocyte: 19.6 %
WBC mixed population: 391 cells/uL (ref 200–950)
WBC: 7.1 10*3/uL (ref 3.8–10.8)

## 2017-03-02 LAB — HEPATIC FUNCTION PANEL
AG Ratio: 1.9 (calc) (ref 1.0–2.5)
ALT: 39 U/L — ABNORMAL HIGH (ref 6–29)
AST: 24 U/L (ref 10–35)
Albumin: 4.4 g/dL (ref 3.6–5.1)
Alkaline phosphatase (APISO): 93 U/L (ref 33–130)
Bilirubin, Direct: 0.1 mg/dL (ref 0.0–0.2)
Globulin: 2.3 g/dL (calc) (ref 1.9–3.7)
Indirect Bilirubin: 0.4 mg/dL (calc) (ref 0.2–1.2)
Total Bilirubin: 0.5 mg/dL (ref 0.2–1.2)
Total Protein: 6.7 g/dL (ref 6.1–8.1)

## 2017-03-02 LAB — TSH: TSH: 1.21 mIU/L (ref 0.40–4.50)

## 2017-03-02 LAB — IRON, TOTAL/TOTAL IRON BINDING CAP
%SAT: 36 % (calc) (ref 11–50)
Iron: 95 ug/dL (ref 45–160)
TIBC: 266 mcg/dL (calc) (ref 250–450)

## 2017-03-02 LAB — VITAMIN B12: Vitamin B-12: 387 pg/mL (ref 200–1100)

## 2017-03-02 LAB — BASIC METABOLIC PANEL WITH GFR
BUN: 18 mg/dL (ref 7–25)
CO2: 22 mmol/L (ref 20–32)
Calcium: 9.8 mg/dL (ref 8.6–10.4)
Chloride: 108 mmol/L (ref 98–110)
Creat: 0.89 mg/dL (ref 0.50–1.05)
GFR, Est African American: 83 mL/min/{1.73_m2} (ref >=60)
GFR, Est Non African American: 71 mL/min/{1.73_m2} (ref >=60)
Glucose, Bld: 83 mg/dL (ref 65–99)
Potassium: 4.3 mmol/L (ref 3.5–5.3)
Sodium: 141 mmol/L (ref 135–146)

## 2017-03-02 LAB — VITAMIN B1: Vitamin B1 (Thiamine): 13 nmol/L (ref 8–30)

## 2017-03-02 NOTE — Telephone Encounter (Signed)
Noted, I have called rx in.

## 2017-03-02 NOTE — Telephone Encounter (Signed)
I would want to increase preauthorization for Botox from 300 to 400 units,  300 units for spasticity, 100 units for chronic migraine headaches, she had violent vomiting, with carotid artery dissection, resulting  large right MCA stroke,

## 2017-03-09 ENCOUNTER — Ambulatory Visit: Payer: BLUE CROSS/BLUE SHIELD | Admitting: Neurology

## 2017-03-09 DIAGNOSIS — G43719 Chronic migraine without aura, intractable, without status migrainosus: Secondary | ICD-10-CM | POA: Diagnosis not present

## 2017-03-10 ENCOUNTER — Ambulatory Visit (INDEPENDENT_AMBULATORY_CARE_PROVIDER_SITE_OTHER): Payer: BLUE CROSS/BLUE SHIELD | Admitting: Orthopedic Surgery

## 2017-03-10 ENCOUNTER — Ambulatory Visit (INDEPENDENT_AMBULATORY_CARE_PROVIDER_SITE_OTHER): Payer: BLUE CROSS/BLUE SHIELD | Admitting: Neurology

## 2017-03-10 ENCOUNTER — Encounter: Payer: Self-pay | Admitting: Neurology

## 2017-03-10 VITALS — BP 151/92 | HR 106 | Resp 16 | Ht 68.75 in | Wt 163.5 lb

## 2017-03-10 DIAGNOSIS — G43019 Migraine without aura, intractable, without status migrainosus: Secondary | ICD-10-CM

## 2017-03-10 DIAGNOSIS — I69954 Hemiplegia and hemiparesis following unspecified cerebrovascular disease affecting left non-dominant side: Secondary | ICD-10-CM

## 2017-03-10 NOTE — Progress Notes (Signed)
Chief Complaint  Patient presents with  . Right hemiplegia    Botox 100 units x 4 vials - specialty pharmacy.      PATIENT: Ruth Gregory DOB: 05/12/1960  Chief Complaint  Patient presents with  . Right hemiplegia    Botox 100 units x 4 vials - specialty pharmacy.     HISTORICAL  Ruth CornKim Espejo is a 56 year old right-handed female  History of right internal carotid section with spastic left hemiparesis, Botox injection every 3 months  She has history of right internal carotid artery dissection following an episode of vomiting from migraine headache in mid June 2009. She was given IV TPA and found to have right MCA occlusion. She underwent emergent right carotid artery stent with distal endovascular recanalization of the middle cerebral artery.  She had small hemorrhagic transformation of the right basal ganglia with mild left hemiparesis, but has done well since then and has been independent.  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  She has made marked recovery, ambulating only with very mild difficulty,  maintain majority of her left arm function,  there is mild limitation in the range of motion in her left shoulder,  mild left shoulder stiffness and pain, most bothersome symptoms is her left elbow discomfort, left wrist achy pain, difficulty releasing left hand flexion, left arm persistent flexion,  left ankle plantar inversion, increased gait difficulty after prolonged walking, This has all been helped by Botox injection.  She has been receiving BOTOX injection since 01/2009 every 3 months, to her left upper extremity and also left lower extremity, responded well.  Repeat US carotid were normal in October 2013  Chronic migraine headaches: She had long-standing history of chronic migraine, average 2-4 headaches each months, she has increased frequency headaches around fall, and spring, she is now taking Topamax ER 150 mg every night as preventive medication, magnesium oxide 400  mg, riboflavin 100 mg twice a day,  She is not candidate for triptan because previous history of stroke, Cambia works well for her, she her insurance only allow her to have 9 tablets each months. Occasionally she take Fioricet, hydrocodone as needed, even prednisone 20 mg as needed as rescue therapy  Over the past few weeks, she been having to 3 headaches each week, used out her cambia, she also complains of neck muscle tightness, receiving chiropractor sometimes   Last Botox injection to spastic left upper extremity works well, but she noticed left hand muscle weakness.  UPDATE Mar 26 2015: She responded very well to previous Botox injection in September 2016, but noticed increased left hand weakness, gradually recovered over the past few weeks  Update July 15 2015: She responded very well to previous Botox injection in December 20 eighth 2016, noticed returning of left finger flexion spasticity, left ankle plantarflexion, want today's injection emphasize on above abnormal posturing.  She still has frequent migraine headaches, 1-2 headache each week, some headache was protracted, lasting 1-2 days, require multiple dose of cambia and Fioricet  Update November 20 2015: Patient called in September 09 2015 "She also said the left hand is very weak- 12,3 digits > 4,5. She said last OV she rec'd botox in wrist. She sts she may not be able to get botox next month if she is not feeling better"  She has a lot of pain on her left leg, mainly her left ankle from knee down, throbbing, tooth ache, she noticed more tendency of left ankle plantar flexion.  She continue have  intermittent migraine headaches, more at the left retro-orbital  Area  UPDATE Dec 6th 2017: She responded very well to previous Botox injection, complains frequent migraine headaches, once a week, lasting 1-2 days, partial response to combination of cambia, Fioricet, Compazine, she is taking Topamax ER 150 mg as preventive  medications  UPDATE June 30 2016: She had her first Botox injection for migraine prevention in December 2017, with only used 75 units she reported dramatic improvement, she only has migraines about once a week, which is milder, better controlled by the medication, instead of 2-3 times more severe headaches, at the end of the benefit, she noticed recurrent of her headaches, she also noticed mild left shoulder stiffness, left ankle turned in,  She noticed worsening right knee pain, recently had right knee injection.  Update November 04 2016: She responded well to previous injection, Botox injection to bilateral frontal temporal region has helped her headache,  UPDATE Mar 11 2017: She responded well to previous injection in August 2018.  Reported increased migraine headache over the past few weeks,  Review of system: Pertinent as above   ALLERGIES: Allergies  Allergen Reactions  . Penicillins Anaphylaxis and Hives  . Imitrex [Sumatriptan]   . Other Swelling    kiwi  . Sulfa Antibiotics Hives  . Zomig [Zolmitriptan]     HOME MEDICATIONS: Current Outpatient Medications  Medication Sig Dispense Refill  . albuterol (PROVENTIL HFA) 108 (90 Base) MCG/ACT inhaler 1 to 2 inhalations 10-15 minutes apart every 4 hours if needed for asthma rescue 48 g 3  . ALPRAZolam (XANAX) 1 MG tablet Take 1/2 to 1 tablet 2 to 3 x / day and please try to limit to 5 days /week to avoid addiction 90 tablet 0  . amphetamine-dextroamphetamine (ADDERALL) 10 MG tablet Take 1/2 to 1 tablet 1 or 2 x daily for ADD 60 tablet 0  . aspirin 325 MG EC tablet Take 325 mg by mouth daily.    . Botulinum Toxin Type A (BOTOX) 200 UNITS SOLR Inject as directed. Change to BOTOX A 100  units    . buPROPion (WELLBUTRIN XL) 300 MG 24 hr tablet TAKE 1 TABLET (300 MG TOTAL) BY MOUTH DAILY. WITH FOOD 90 tablet 3  . butalbital-acetaminophen-caffeine (FIORICET, ESGIC) 50-325-40 MG tablet TAKE 1 TABLET BY MOUTH EVERY 6 HOURS AS NEEDED FOR  HEADACHE 30 tablet 5  . CAMBIA 50 MG PACK TAKE 1 PACKET BY MOUTH AS NEEDED. 27 each 3  . cetirizine (ZYRTEC) 10 MG tablet Take 10 mg by mouth daily.    . Diclofenac Potassium (CAMBIA) 50 MG PACK Take one packet at onset of migraine.  May repeat once in two hours, if needed.  Max dose 2 packets per day.  #27/90days 27 each 3  . Diclofenac Sodium (PENNSAID) 2 % SOLN Place 1 Squirt onto the skin 2 (two) times daily. 1 Bottle 0  . fenofibrate micronized (LOFIBRA) 134 MG capsule TAKE 1 CAPSULE EVERY DAY 90 capsule 3  . fluticasone furoate-vilanterol (BREO ELLIPTA) 100-25 MCG/INH AEPB 1 Inhalation daily to prevent Asthma 180 each 3  . levothyroxine (SYNTHROID, LEVOTHROID) 100 MCG tablet TAKE 1 TABLET BY MOUTH EVERY DAY 90 tablet 1  . magnesium oxide (MAG-OX) 400 MG tablet Take 400 mg by mouth 2 (two) times daily.     . montelukast (SINGULAIR) 10 MG tablet Take 1 tablet (10 mg total) by mouth at bedtime. 90 tablet 3  . Multiple Vitamin (MULTIVITAMIN WITH MINERALS) TABS tablet Take 1 tablet by mouth  daily.    . nortriptyline (PAMELOR) 25 MG capsule Take 2 capsules (50 mg total) by mouth at bedtime. 60 capsule 11  . omeprazole (PRILOSEC) 40 MG capsule TAKE 1 CAPSULE (40 MG TOTAL) BY MOUTH DAILY. FOR ACID INDIGESTION AND REFLUX 30 capsule 5  . predniSONE (DELTASONE) 20 MG tablet TAKE 1 TABLET PER DAY AS NEEDED FOR HEADACHES. 90 tablet 1  . prochlorperazine (COMPAZINE) 5 MG tablet TAKE 1 TABLET BY MOUTH 3 TIMES A DAY AS NEEDED FOR NAUSEA 50 tablet 1  . Riboflavin 100 MG TABS Take 100 mg by mouth. bid    . rosuvastatin (CRESTOR) 40 MG tablet     . tiZANidine (ZANAFLEX) 2 MG tablet TAKE 1 TABLET (2 MG TOTAL) BY MOUTH EVERY 6 (SIX) HOURS AS NEEDED FOR MUSCLE SPASMS. 30 tablet 6  . Topiramate ER (TROKENDI XR) 200 MG CP24 Take 200 mg by mouth at bedtime. 30 capsule 11  . vitamin B-12 (CYANOCOBALAMIN) 1000 MCG tablet Take 1,000 mcg by mouth daily.    . Vitamin D, Ergocalciferol, (DRISDOL) 50000 units CAPS capsule  TAKE 1 CAPSULE DAILY 30 capsule 1   No current facility-administered medications for this visit.     PAST MEDICAL HISTORY: Past Medical History:  Diagnosis Date  . Anemia   . Anxiety   . Asthma   . Chronic heartburn   . Depression   . Elevated cholesterol    on Crestor  . Headache(784.0)   . Hypertension   . Hypothyroidism    S/P thyroidectomy  . Migraine   . Occlusion and stenosis of carotid artery with cerebral infarction   . Spasm of muscle   . Unspecified cerebral artery occlusion with cerebral infarction   . Vitamin D deficiency     PAST SURGICAL HISTORY: Past Surgical History:  Procedure Laterality Date  . AUGMENTATION MAMMAPLASTY Bilateral 2000  . COSMETIC SURGERY    . CYSTOCELE REPAIR N/A 01/08/2013   Procedure: ANTERIOR REPAIR (CYSTOCELE);  Surgeon: Meriel Pica, MD;  Location: [facility];  Service: Gynecology;  Laterality: N/A;  . NASAL SINUS SURGERY    . VAGINAL HYSTERECTOMY      FAMILY HISTORY: Family History  Problem Relation Age of Onset  . Heart disease Mother   . Peptic Ulcer Disease Mother   . Heart disease Father   . Parkinson's disease Father     SOCIAL HISTORY:  Social History   Socioeconomic History  . Marital status: Married    Spouse name: Brett Canales  . Number of children: 2  . Years of education: college  . Highest education level: Not on file  Social Needs  . Financial resource strain: Not on file  . Food insecurity - worry: Not on file  . Food insecurity - inability: Not on file  . Transportation needs - medical: Not on file  . Transportation needs - non-medical: Not on file  Occupational History  . Occupation: DESIGNER    Employer: [employer]  Tobacco Use  . Smoking status: Never Smoker  . Smokeless tobacco: Never Used  Substance and Sexual Activity  . Alcohol use: Yes    Alcohol/week: 0.0 oz    Comment: occassionally drinks a glass of wine  . Drug use: No  . Sexual activity: Not on file  Other Topics Concern  . 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 Patient is married and lives with her husband. Patient has college education.   Right handed.  Caffeine- two cups daily.     PHYSICAL EXAM   Vitals:   03/10/17 1631  BP: (!) 151/92  Pulse: (!) 106  Resp: 16  Weight: 163 lb 8 oz (74.2 kg)  Height: 5' 8.75" (1.746 m)    Not recorded      Body mass index is 24.32 kg/m.  PHYSICAL EXAMNIATION:  Gen: NAD, conversant, well nourised, obese, well groomed                     Cardiovascular: Regular rate rhythm, no peripheral edema, warm, nontender. Eyes: Conjunctivae clear without exudates or hemorrhage Neck: Supple, no carotid bruise. Pulmonary: Clear to auscultation bilaterally   NEUROLOGICAL EXAM:  MENTAL STATUS: Speech:    Speech is normal; fluent and spontaneous with normal comprehension.  Cognition:     Orientation to time, place and person     Normal recent and remote memory     Normal Attention span and concentration     Normal Language, naming, repeating,spontaneous speech     Fund of knowledge   CRANIAL NERVES: CN II: Visual fields are full to confrontation. Fundoscopic exam is normal with sharp discs and no vascular changes. Pupils are round equal and briskly reactive to light. CN III, IV, VI: extraocular movement are normal. No ptosis. CN V: Facial sensation is intact to pinprick in all 3 divisions bilaterally. Corneal responses are intact.  CN VII: Face is symmetric with normal eye closure and smile. CN VIII: Hearing is normal to rubbing fingers CN IX, X: Palate elevates symmetrically. Phonation is normal. CN XI: Head turning and shoulder shrug are intact CN XII: Tongue is midline with normal movements and no atrophy.  MOTOR: Mild spastic left hemiparesis, left upper extremity proximal and distal 4/5, mild left hip flexion and left ankle dorsiflexion weakness  REFLEXES: Reflexes are 2+ and symmetric at the biceps,  triceps, knees, and ankles. Plantar responses are flexor.  SENSORY: Intact to light touch, pinprick, position sense, and vibration sense are intact in fingers and toes.  COORDINATION: Rapid alternating movements and fine finger movements are intact. There is no dysmetria on finger-to-nose and heel-knee-shin.    GAIT/STANCE: Mildly unsteady gait, checking her left foot across the floor  Romberg is absent.   DIAGNOSTIC DATA (LABS, IMAGING, TESTING) - I reviewed patient records, labs, notes, testing and imaging myself where available.   ASSESSMENT AND PLAN  Ruth CornKim Garbutt is a 56 y.o. female   Chronic migraine headaches  Continue preventive medications, extended release topiramate 150 mg every day  Cambia, Fioricet, Compazine as needed,+ tizanidine prn  Continue magnesium oxide, riboflavin as preventive medications,  Add on nortriptyline 25 mg titrating to 50 every night as migraine prevention   Spastic left hemiparesis following right internal carotid artery dissection, right MCA stroke  Electrical stimulation guided Botox injection, used 300 units of Botox A  Left pronator teres 25 units Left flexor carpi ulnaris 25 units Left flexor digitorum profoundi 25 units Left pectoralis major 25 units Left palmaris longus 25 units  Left teres major 25 units Left latissimus dorsi 25 units Left brachialis 25 units  Left tibialis posterior 50 units Left flexor digitorum longus 25 unitsx2=50 units  I also injected 100 units for chronic migraine headache  Right Corrugate 5 units Left  Corrugate 5 units Proceurus 5 units Frontalis 20 Temporalis 40 units Occipital 25 units.  Levert FeinsteinYijun Nic Lampe, M.D. Ph.D.  [facility] [address], [address] [place], [place] [address] Ph: [phone] Fax: [phone]  CC: Referring Provider

## 2017-03-10 NOTE — Progress Notes (Signed)
**  Botox 100 units x 4 vials, NDC [id number], Lot [id number], Exp 08/2019, specialty pharmacy.//mck,rn**

## 2017-03-11 ENCOUNTER — Ambulatory Visit
Admission: RE | Admit: 2017-03-11 | Discharge: 2017-03-11 | Disposition: A | Discharge status: Home / Self Care | Payer: BLUE CROSS/BLUE SHIELD | Source: Ambulatory Visit | Attending: Orthopedic Surgery | Admitting: Orthopedic Surgery

## 2017-03-11 DIAGNOSIS — M19041 Primary osteoarthritis, right hand: Secondary | ICD-10-CM | POA: Diagnosis not present

## 2017-03-11 DIAGNOSIS — M79644 Pain in right finger(s): Secondary | ICD-10-CM

## 2017-03-14 ENCOUNTER — Other Ambulatory Visit: Payer: Self-pay

## 2017-03-14 MED ORDER — OMEPRAZOLE 40 MG PO CPDR: DELAYED_RELEASE_CAPSULE | ORAL | 5 refills | Status: DC

## 2017-03-15 ENCOUNTER — Ambulatory Visit: Payer: Self-pay | Admitting: Physician Assistant

## 2017-03-16 ENCOUNTER — Ambulatory Visit (INDEPENDENT_AMBULATORY_CARE_PROVIDER_SITE_OTHER): Payer: BLUE CROSS/BLUE SHIELD | Admitting: Orthopedic Surgery

## 2017-03-16 ENCOUNTER — Encounter (INDEPENDENT_AMBULATORY_CARE_PROVIDER_SITE_OTHER): Payer: Self-pay | Admitting: Orthopedic Surgery

## 2017-03-16 DIAGNOSIS — M1811 Unilateral primary osteoarthritis of first carpometacarpal joint, right hand: Secondary | ICD-10-CM

## 2017-03-19 ENCOUNTER — Encounter (INDEPENDENT_AMBULATORY_CARE_PROVIDER_SITE_OTHER): Payer: Self-pay | Admitting: Orthopedic Surgery

## 2017-03-19 DIAGNOSIS — M1811 Unilateral primary osteoarthritis of first carpometacarpal joint, right hand: Secondary | ICD-10-CM

## 2017-03-19 MED ORDER — METHYLPREDNISOLONE ACETATE 40 MG/ML IJ SUSP
13.3300 mg | INTRAMUSCULAR | Status: AC | PRN
  Administered 2017-03-19: 13.33 mg via INTRA_ARTICULAR

## 2017-03-19 MED ORDER — BUPIVACAINE HCL 0.25 % IJ SOLN
0.3300 mL | INTRAMUSCULAR | Status: AC | PRN
  Administered 2017-03-19: .33 mL via INTRA_ARTICULAR

## 2017-03-19 MED ORDER — LIDOCAINE HCL 1 % IJ SOLN
3.0000 mL | INTRAMUSCULAR | Status: AC | PRN
  Administered 2017-03-19: 3 mL

## 2017-03-19 NOTE — Progress Notes (Signed)
Office Visit Note   Patient: Ruth Gregory           Date of Birth: 10/31/1960           MRN: [id number] Visit Date: 03/16/2017 Requested by: Lucky CowboyMcKeown, William, MD [address] [place], [place] [address] PCP: Lucky CowboyMcKeown, William, MD  Subjective: Chief Complaint  Patient presents with  . Right Hand - Follow-up    HPI: Ruth Gregory is a patient who comes in for follow-up of right hand and thumb pain.  States it is hard for her to write.  Topical anti-inflammatory helps.  This pain does not wake her from sleep at night.  Localizes the pain to the thumb joint around the [facility].              ROS: All systems reviewed are negative as they relate to the chief complaint within the history of present illness.  Patient denies  fevers or chills.   Assessment & Plan: Visit Diagnoses:  1. Arthritis of carpometacarpal [facility](CMC) joint of right thumb     Plan: Impression is right thumb CMC joint with no other abnormality seen on MRI scan.  Plan is injection into that joint under ultrasound guidance today.  We will see how that helps her.  Her arthritis is moderate.  It does not look surgical at this time based on her symptoms.  Follow-Up Instructions: Return if symptoms worsen or fail to improve.   Orders:  No orders of the defined types were placed in this encounter.  No orders of the defined types were placed in this encounter.     Procedures: Small Joint Inj: R thumb CMC on 03/19/2017 9:40 AM Indications: pain Details: 25 G needle, ultrasound-guided radial approach  Spinal Needle: No  Medications: 3 mL lidocaine 1 %; 0.33 mL bupivacaine 0.25 %; 13.33 mg methylPREDNISolone acetate 40 MG/ML Outcome: tolerated well, no immediate complications Procedure, treatment alternatives, risks and benefits explained, specific risks discussed. Consent was given by the patient. Immediately prior to procedure a time out was called to verify the correct patient, procedure, equipment, support staff and  site/side marked as required. Patient was prepped and draped in the usual sterile fashion.       Clinical Data: No additional findings.  Objective: Vital Signs: There were no vitals taken for this visit.  Physical Exam:   Constitutional: Patient appears well-developed HEENT:  Head: Normocephalic Eyes:EOM are normal Neck: Normal range of motion Cardiovascular: Normal rate Pulmonary/chest: Effort normal Neurologic: Patient is alert Skin: Skin is warm Psychiatric: Patient has normal mood and affect    Ortho Exam: Intact EPL FPL function in the right thumb.  There is tenderness at the [facility] joint with positive grind test.  Not much in the way of coarseness with loading of the [facility] joint.  Radial pulses intact.  Collateral ligaments intact around the thumb MCP joint  Specialty Comments:  No specialty comments available.  Imaging: No results found.   PMFS History: Patient Active Problem List   Diagnosis Date Noted  . Acute pain of right knee 05/24/2016  . Hypothyroidism 07/23/2015  . Fatigue 04/08/2014  . Elevated blood pressure reading without diagnosis of hypertension 10/03/2013  . Vitamin D deficiency 10/03/2013  . Medication management 10/03/2013  . Prediabetes 10/03/2013  . ADD (attention deficit disorder) 10/03/2013  . Mixed hyperlipidemia 04/04/2013  . Sinusitis, chronic 04/04/2013  . Migraine 09/06/2012  . Spastic hemiplegia affecting left nondominant side (HCC) 09/06/2012  . CVA (cerebral vascular accident) (HCC)  Past Medical History:  Diagnosis Date  . Anemia   . Anxiety   . Asthma   . Chronic heartburn   . Depression   . Elevated cholesterol    on Crestor  . Headache(784.0)   . Hypertension   . Hypothyroidism    S/P thyroidectomy  . Migraine   . Occlusion and stenosis of carotid artery with cerebral infarction   . Spasm of muscle   . Unspecified cerebral artery occlusion with cerebral infarction   . Vitamin D deficiency     Family History    Problem Relation Age of Onset  . Heart disease Mother   . Peptic Ulcer Disease Mother   . Heart disease Father   . Parkinson's disease Father     Past Surgical History:  Procedure Laterality Date  . AUGMENTATION MAMMAPLASTY Bilateral 2000  . COSMETIC SURGERY    . CYSTOCELE REPAIR N/A 01/08/2013   Procedure: ANTERIOR REPAIR (CYSTOCELE);  Surgeon: Meriel Picaichard M Holland, MD;  Location: [facility];  Service: Gynecology;  Laterality: N/A;  . NASAL SINUS SURGERY    . VAGINAL HYSTERECTOMY     Social History   Occupational History  . Occupation: DESIGNER    Employer: SELF  Tobacco Use  . Smoking status: Never Smoker  . Smokeless tobacco: Never Used  Substance and Sexual Activity  . Alcohol use: Yes    Alcohol/week: 0.0 oz    Comment: occassionally drinks a glass of wine  . Drug use: No  . Sexual activity: Not on file

## 2017-03-28 ENCOUNTER — Other Ambulatory Visit: Payer: Self-pay | Admitting: *Deleted

## 2017-03-28 MED ORDER — BUPROPION HCL ER (XL) 300 MG PO TB24: ORAL_TABLET | ORAL | 3 refills | Status: DC

## 2017-04-04 ENCOUNTER — Other Ambulatory Visit: Payer: Self-pay | Admitting: *Deleted

## 2017-04-04 MED ORDER — NORTRIPTYLINE HCL 25 MG PO CAPS: 50.0000 mg | ORAL_CAPSULE | Freq: Every day | ORAL | 3 refills | Status: DC

## 2017-04-12 ENCOUNTER — Ambulatory Visit: Payer: Self-pay | Admitting: Neurology

## 2017-04-15 ENCOUNTER — Other Ambulatory Visit: Payer: Self-pay | Admitting: Internal Medicine

## 2017-04-15 DIAGNOSIS — B029 Zoster without complications: Secondary | ICD-10-CM

## 2017-04-27 ENCOUNTER — Telehealth: Payer: Self-pay | Admitting: *Deleted

## 2017-04-27 NOTE — Telephone Encounter (Signed)
Trokendi XR PA approved by [place] [employer] [phone]).  Valid through 04/27/2018.  PT [id number].  Pharmacy notified and will fill prescription.

## 2017-05-03 ENCOUNTER — Telehealth: Payer: Self-pay | Admitting: Neurology

## 2017-05-03 NOTE — Telephone Encounter (Signed)
Pt had left a VM on the referral line-said she was calling to schedule her botox apt. [phone]

## 2017-05-03 NOTE — Telephone Encounter (Signed)
I called to schedule the patient for her injection but she did not answer so I left a VM asking her to call me back.  °

## 2017-05-03 NOTE — Telephone Encounter (Signed)
Pt returned Danielle's call. Appt has been scheduled for 06/21/17.

## 2017-05-04 ENCOUNTER — Other Ambulatory Visit: Payer: Self-pay | Admitting: Internal Medicine

## 2017-05-04 DIAGNOSIS — F988 Other specified behavioral and emotional disorders with onset usually occurring in childhood and adolescence: Secondary | ICD-10-CM

## 2017-05-04 MED ORDER — AMPHETAMINE-DEXTROAMPHETAMINE 10 MG PO TABS: ORAL_TABLET | ORAL | 0 refills | Status: DC

## 2017-05-04 NOTE — Telephone Encounter (Signed)
Noted, thank you

## 2017-05-17 ENCOUNTER — Ambulatory Visit: Payer: Self-pay | Admitting: Physician Assistant

## 2017-05-27 ENCOUNTER — Telehealth: Payer: Self-pay | Admitting: Neurology

## 2017-05-27 NOTE — Telephone Encounter (Signed)
Spoke with Selena BattenKim and explained we will complete PA for Cambia next week.  She verbalized understanding of same, just had rx. filled, so just needs PA done in the next couple of wks/fim

## 2017-05-27 NOTE — Telephone Encounter (Signed)
Pt called insurance will not cover CAMBIA 50 MG PACK. She is wanting to know if a PA could be done. Pt is aware the clinic closes at noon today. Please call to advise.

## 2017-05-30 ENCOUNTER — Ambulatory Visit (INDEPENDENT_AMBULATORY_CARE_PROVIDER_SITE_OTHER): Payer: BLUE CROSS/BLUE SHIELD | Admitting: Physician Assistant

## 2017-05-30 VITALS — BP 132/84 | HR 88 | Temp 97.2°F | Resp 16 | Wt 162.4 lb

## 2017-05-30 DIAGNOSIS — R350 Frequency of micturition: Secondary | ICD-10-CM | POA: Diagnosis not present

## 2017-05-30 MED ORDER — CIPROFLOXACIN HCL 500 MG PO TABS: 500.0000 mg | ORAL_TABLET | Freq: Two times a day (BID) | ORAL | 0 refills | Status: AC

## 2017-05-30 NOTE — Patient Instructions (Signed)
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
## 2017-05-31 LAB — URINALYSIS, ROUTINE W REFLEX MICROSCOPIC
Bilirubin Urine: NEGATIVE
Glucose, UA: NEGATIVE
Hyaline Cast: NONE SEEN /LPF
Ketones, ur: NEGATIVE
Nitrite: POSITIVE — AB
Specific Gravity, Urine: 1.021 (ref 1.001–1.03)
Squamous Epithelial / LPF: NONE SEEN /HPF (ref <=5)
WBC, UA: >=60 /HPF — AB (ref 0–5)
pH: 6.5 (ref 5.0–8.0)

## 2017-05-31 LAB — URINE CULTURE
MICRO NUMBER:: 90276899
SPECIMEN QUALITY:: ADEQUATE

## 2017-06-01 NOTE — Telephone Encounter (Signed)
We received a prior authorization request for this medication. I have completed and submitted the PA on Cover My Meds and should have a determination within 48-72 hours.  Cover My Meds Key: [id number] - PA Case ID: [id number]   APPROVED. Questionnaire submitted. PA Case [id number] Status: Approved. Authorization and Notifications Completed.

## 2017-06-12 ENCOUNTER — Other Ambulatory Visit: Payer: Self-pay | Admitting: Internal Medicine

## 2017-06-12 MED ORDER — ALBUTEROL SULFATE HFA 108 (90 BASE) MCG/ACT IN AERS: INHALATION_SPRAY | RESPIRATORY_TRACT | 3 refills | Status: DC

## 2017-06-19 ENCOUNTER — Encounter: Payer: Self-pay | Admitting: Internal Medicine

## 2017-06-19 NOTE — Patient Instructions (Addendum)

## 2017-06-19 NOTE — Progress Notes (Signed)
[facility] Lucky Cowboy, M.D.     Dyanne Carrel. Steffanie Dunn, P.A.-C Judd Gaudier, DNP [facility] [address] [place], [place]. [address] Telephone [phone] Telefax [phone] Annual Screening/Preventative Visit & Comprehensive Evaluation &  Examination     This very nice 57 y.o. MWF  presents for a Screening/Preventative Visit & comprehensive evaluation and management of multiple medical co-morbidities.  Patient has been followed for HTN, HLD, Prediabetes  and Vitamin D Deficiency.     She had a Rt MCA  CVA  (2009) attributed to  Car Aa dissection from fibromuscular dysplasia and w/ residua of a mild spastic Lt HP and persistent residual cognitive dysfunction with difficulty focusing and concentrating (imjproved on Adderall. Patient is followed by Dr Terrace Arabia for Bo-Tox injections as well as for her chronic Migraines.     Patient is followed expectantly for labile  HTN & BP has been controlled. Today's BP is borderline elevated at 140/92. Patient has had no complaints of any cardiac type chest pain, palpitations, dyspnea / orthopnea / PND, dizziness, claudication, or dependent edema.     Hyperlipidemia is controlled with diet & meds. Patient denies myalgias or other med SE's. Last Lipids were  Lab Results  Component Value Date   CHOL 197 12/06/2016   HDL 54 12/06/2016   LDLCALC 107 (H) 12/06/2016   TRIG 253 (H) 12/06/2016   CHOLHDL 3.6 12/06/2016        Patient has been on Thyroid Replacement since Thyroid surgery for benign nodule in  in 2007.  Also, the patient has history of PreDiabetes (A1c 5.8%/2008) and has had no symptoms of reactive hypoglycemia, diabetic polys, paresthesias or visual blurring.  Last A1c was Normal & at goal:  Lab Results  Component Value Date   HGBA1C 5.3 05/18/2016      Further, the patient also has history of Vitamin D Deficiency ("23"/2008)  and supplements vitamin D without any suspected  side-effects. Last vitamin D was near goal (70-100):   Lab Results  Component Value Date   VD25OH 56 05/18/2016   Current Outpatient Medications on File Prior to Visit  Medication Sig  . albuterol (PROVENTIL HFA) 108 (90 Base) MCG/ACT inhaler 1 to 2 inhalations 10-15 minutes apart every 4 hours if needed for asthma rescue  . aspirin 325 MG EC tablet Take 325 mg by mouth daily.  . Botulinum Toxin Type A (BOTOX) 200 UNITS SOLR Inject as directed. Change to BOTOX A 100  units  . buPROPion (WELLBUTRIN XL) 300 MG 24 hr tablet TAKE 1 TABLET (300 MG TOTAL) BY MOUTH DAILY. WITH FOOD  . butalbital-acetaminophen-caffeine (FIORICET, ESGIC) 50-325-40 MG tablet TAKE 1 TABLET BY MOUTH EVERY 6 HOURS AS NEEDED FOR HEADACHE  . CAMBIA 50 MG PACK TAKE 1 PACKET BY MOUTH AS NEEDED.  Marland Kitchen cetirizine (ZYRTEC) 10 MG tablet Take 10 mg by mouth daily.  . Diclofenac Potassium (CAMBIA) 50 MG PACK Take one packet at onset of migraine.  May repeat once in two hours, if needed.  Max dose 2 packets per day.  #27/90days  . Diclofenac Sodium (PENNSAID) 2 % SOLN Place 1 Squirt onto the skin 2 (two) times daily.  . fenofibrate micronized (LOFIBRA) 134 MG capsule TAKE 1 CAPSULE EVERY DAY  . fluticasone furoate-vilanterol (BREO ELLIPTA) 100-25 MCG/INH AEPB 1 Inhalation daily to prevent Asthma  . levothyroxine (SYNTHROID, LEVOTHROID) 100 MCG tablet TAKE 1 TABLET BY MOUTH EVERY DAY  . magnesium oxide (MAG-OX) 400 MG tablet Take 400  mg by mouth 2 (two) times daily.   . montelukast (SINGULAIR) 10 MG tablet Take 1 tablet (10 mg total) by mouth at bedtime.  . Multiple Vitamin (MULTIVITAMIN WITH MINERALS) TABS tablet Take 1 tablet by mouth daily.  . nortriptyline (PAMELOR) 25 MG capsule Take 2 capsules (50 mg total) by mouth at bedtime.  Marland Kitchen omeprazole (PRILOSEC) 40 MG capsule TAKE 1 CAPSULE (40 MG TOTAL) BY MOUTH DAILY. FOR ACID INDIGESTION AND REFLUX  . predniSONE (DELTASONE) 20 MG tablet TAKE 1 TABLET PER DAY AS NEEDED FOR HEADACHES.  Marland Kitchen  prochlorperazine (COMPAZINE) 5 MG tablet TAKE 1 TABLET BY MOUTH 3 TIMES A DAY AS NEEDED FOR NAUSEA  . Riboflavin 100 MG TABS Take 100 mg by mouth. bid  . rosuvastatin (CRESTOR) 40 MG tablet   . tiZANidine (ZANAFLEX) 2 MG tablet TAKE 1 TABLET (2 MG TOTAL) BY MOUTH EVERY 6 (SIX) HOURS AS NEEDED FOR MUSCLE SPASMS.  . Topiramate ER (TROKENDI XR) 200 MG CP24 Take 200 mg by mouth at bedtime.  . vitamin B-12 (CYANOCOBALAMIN) 1000 MCG tablet Take 1,000 mcg by mouth daily.  . Vitamin D, Ergocalciferol, (DRISDOL) 50000 units CAPS capsule TAKE 1 CAPSULE DAILY  . amphetamine-dextroamphetamine (ADDERALL) 10 MG tablet Take 1/2 to 1 tablet 1 or 2 x daily for ADD   No current facility-administered medications on file prior to visit.    Allergies  Allergen Reactions  . Penicillins Anaphylaxis and Hives  . Imitrex [Sumatriptan]   . Other Swelling    kiwi  . Sulfa Antibiotics Hives  . Zomig [Zolmitriptan]    Past Medical History:  Diagnosis Date  . Anemia   . Anxiety   . Asthma   . Chronic heartburn   . Depression   . Elevated cholesterol    on Crestor  . Headache(784.0)   . Hypertension   . Hypothyroidism    S/P thyroidectomy  . Migraine   . Occlusion and stenosis of carotid artery with cerebral infarction   . Spasm of muscle   . Unspecified cerebral artery occlusion with cerebral infarction   . Vitamin D deficiency    - Last Colon 04/2009 - Dr Madilyn Fireman recc 10 yr f/u 08 May 2019)  - Last MGM 07/26/2016  - Hysterectomy 05/07/2014 - Dr Marcelle Overlie  Health Maintenance  Topic Date Due  . COLONOSCOPY  11/02/2008  . PAP SMEAR  05/07/2017  . MAMMOGRAM  07/27/2018  . TETANUS/TDAP  05/18/2026  . INFLUENZA VACCINE  Completed  . Hepatitis C Screening  Completed  . HIV Screening  Completed   Immunization History  Administered Date(s) Administered  . Influenza Inj Mdck Quad With Preservative 01/20/2017  . Influenza Split 01/24/2014  . Influenza, Seasonal, Injecte, Preservative Fre 02/09/2016  .  Influenza-Unspecified 11/28/2011  . PPD Test 04/04/2013, 04/22/2015, 05/18/2016  . Pneumococcal Polysaccharide-23 03/29/1998  . Td 03/29/2002  . Tdap 05/18/2016   Last Colon -  Last MGM -  Past Surgical History:  Procedure Laterality Date  . AUGMENTATION MAMMAPLASTY Bilateral 2000  . COSMETIC SURGERY    . CYSTOCELE REPAIR N/A 01/08/2013   Procedure: ANTERIOR REPAIR (CYSTOCELE);  Surgeon: Meriel Pica, MD;  Location: [facility];  Service: Gynecology;  Laterality: N/A;  . NASAL SINUS SURGERY    . VAGINAL HYSTERECTOMY     Family History  Problem Relation Age of Onset  . Heart disease Mother   . Peptic Ulcer Disease Mother   . Heart disease Father   . Parkinson's disease Father    Social History  Tobacco Use  . Smoking status: Never Smoker  . Smokeless tobacco: Never Used  Substance Use Topics  . Alcohol use: Yes    Alcohol/week: 0.0 oz    Comment: occassionally drinks a glass of wine  . Drug use: No    ROS Constitutional: Denies fever, chills, weight loss/gain, headaches, insomnia,  night sweats, and change in appetite. Does c/o fatigue. Eyes: Denies redness, blurred vision, diplopia, discharge, itchy, watery eyes.  ENT: Denies discharge, congestion, post nasal drip, epistaxis, sore throat, earache, hearing loss, dental pain, Tinnitus, Vertigo, Sinus pain, snoring.  Cardio: Denies chest pain, palpitations, irregular heartbeat, syncope, dyspnea, diaphoresis, orthopnea, PND, claudication, edema Respiratory: denies cough, dyspnea, DOE, pleurisy, hoarseness, laryngitis, wheezing.  Gastrointestinal: Denies dysphagia, heartburn, reflux, water brash, pain, cramps, nausea, vomiting, bloating, diarrhea, constipation, hematemesis, melena, hematochezia, jaundice, hemorrhoids Genitourinary: Denies dysuria, frequency, urgency, nocturia, hesitancy, discharge, hematuria, flank pain Breast: Breast lumps, nipple discharge, bleeding.  Musculoskeletal: Denies arthralgia, myalgia, stiffness,  Jt. Swelling, pain, limp, and strain/sprain. Denies falls. Skin: Denies puritis, rash, hives, warts, acne, eczema, changing in skin lesion Neuro: No weakness, tremor, incoordination, spasms, paresthesia, pain Psychiatric: Denies confusion, memory loss, sensory loss. Denies Depression. Endocrine: Denies change in weight, skin, hair change, nocturia, and paresthesia, diabetic polys, visual blurring, hyper / hypo glycemic episodes.  Heme/Lymph: No excessive bleeding, bruising, enlarged lymph nodes.  Physical Exam  BP (!) 140/92   Pulse 84   Temp (!) 97.3 F (36.3 C)   Resp 16   Ht 5' 8.5" (1.74 m)   Wt 162 lb (73.5 kg)   BMI 24.27 kg/m   General Appearance: Well nourished, well groomed and in no apparent distress.  Eyes: PERRLA, EOMs, conjunctiva no swelling or erythema, normal fundi and vessels. Sinuses: No frontal/maxillary tenderness ENT/Mouth: EACs patent / TMs  nl. Nares clear without erythema, swelling, mucoid exudates. Oral hygiene is good. No erythema, swelling, or exudate. Tongue normal, non-obstructing. Tonsils not swollen or erythematous. Hearing normal.  Neck: Supple, thyroid not palpable. No bruits, nodes or JVD. Respiratory: Respiratory effort normal.  BS equal and clear bilateral without rales, rhonci, wheezing or stridor. Cardio: Heart sounds are normal with regular rate and rhythm and no murmurs, rubs or gallops. Peripheral pulses are normal and equal bilaterally without edema. No aortic or femoral bruits. Chest: symmetric with normal excursions and percussion. Breasts: Symmetric, without lumps, nipple discharge, retractions, or fibrocystic changes.  Abdomen: Flat, soft with bowel sounds active. Nontender, no guarding, rebound, hernias, masses, or organomegaly.  Lymphatics: Non tender without lymphadenopathy.   Musculoskeletal: Full ROM all peripheral extremities, joint stability, 5/5 strength, and normal gait. Skin: Warm and dry without rashes, lesions, cyanosis,  clubbing or  ecchymosis.  Neuro: Cranial nerves intact, reflexes equal bilaterally. Normal muscle tone, no cerebellar symptoms. Sensation intact.  Pysch: Alert and oriented X 3, normal affect, Insight and Judgment appropriate.   Assessment and Plan  1. Annual Preventative Screening Examination  2. Labile hypertension  - CBC with Differential/Platelet - BASIC METABOLIC PANEL WITH GFR - Magnesium - TSH - EKG 12-Lead - [place], RETROPERITNL ABD,  LTD - Urinalysis, Routine w reflex microscopic - Microalbumin / creatinine urine ratio  3. Hyperlipidemia, mixed  - Hepatic function panel - Lipid panel - TSH - EKG 12-Lead - [place], RETROPERITNL ABD,  LTD  4. Abnormal glucose  - Hemoglobin A1c - Insulin, random - EKG 12-Lead - [place], RETROPERITNL ABD,  LTD  5. Vitamin D deficiency  - VITAMIN D 25 Hydroxy   6. Hypothyroidism  -  TSH  7. Prediabetes  - Hemoglobin A1c - Insulin, random - EKG 12-Lead - US, RETROPERITNL ABD,  LTD  8. Gastroesophageal reflux disease  - CBC with Differential/Platelet  9. Screening for ischemic heart disease  - Lipid panel - EKG 12-Lead  10. FHx: heart disease  - EKG 12-Lead - US, RETROPERITNL ABD,  LTD  11. Screening for AAA (aortic abdominal aneurysm)  - US, RETROPERITNL ABD,  LTD  12. Screening for colorectal cancer  - POC Hemoccult Bld/Stl   13. Screening examination for pulmonary tuberculosis   14. Fatigue, unspecified type  - TSH - Iron,Total/Total Iron Binding Cap - Vitamin B12  15. Medication management  - CBC with Differential/Platelet - BASIC METABOLIC PANEL WITH GFR - Hepatic function panel - Magnesium - Lipid panel - TSH - Hemoglobin A1c - Insulin, random - VITAMIN D 25 Hydroxyl - Urinalysis, Routine w reflex microscopic - Microalbumin / creatinine urine ratio         Patient was counseled in prudent diet to achieve/maintain BMI less than 25 for weight control, BP monitoring, regular exercise and medications.  Discussed med's effects and SE's. Screening labs and tests as requested with regular follow-up as recommended. Over 40 minutes of exam, counseling, chart review and high complex critical decision making was performed.

## 2017-06-20 ENCOUNTER — Ambulatory Visit (INDEPENDENT_AMBULATORY_CARE_PROVIDER_SITE_OTHER): Payer: BLUE CROSS/BLUE SHIELD | Admitting: Internal Medicine

## 2017-06-20 VITALS — BP 140/92 | HR 84 | Temp 97.3°F | Resp 16 | Ht 68.5 in | Wt 162.0 lb

## 2017-06-20 DIAGNOSIS — Z131 Encounter for screening for diabetes mellitus: Secondary | ICD-10-CM | POA: Diagnosis not present

## 2017-06-20 DIAGNOSIS — Z0001 Encounter for general adult medical examination with abnormal findings: Secondary | ICD-10-CM

## 2017-06-20 DIAGNOSIS — Z136 Encounter for screening for cardiovascular disorders: Secondary | ICD-10-CM

## 2017-06-20 DIAGNOSIS — E559 Vitamin D deficiency, unspecified: Secondary | ICD-10-CM | POA: Diagnosis not present

## 2017-06-20 DIAGNOSIS — E039 Hypothyroidism, unspecified: Secondary | ICD-10-CM

## 2017-06-20 DIAGNOSIS — Z8249 Family history of ischemic heart disease and other diseases of the circulatory system: Secondary | ICD-10-CM

## 2017-06-20 DIAGNOSIS — Z111 Encounter for screening for respiratory tuberculosis: Secondary | ICD-10-CM

## 2017-06-20 DIAGNOSIS — Z0000 Encounter for general adult medical examination without abnormal findings: Secondary | ICD-10-CM

## 2017-06-20 DIAGNOSIS — Z1329 Encounter for screening for other suspected endocrine disorder: Secondary | ICD-10-CM | POA: Diagnosis not present

## 2017-06-20 DIAGNOSIS — Z1211 Encounter for screening for malignant neoplasm of colon: Secondary | ICD-10-CM

## 2017-06-20 DIAGNOSIS — R7309 Other abnormal glucose: Secondary | ICD-10-CM

## 2017-06-20 DIAGNOSIS — Z1212 Encounter for screening for malignant neoplasm of rectum: Secondary | ICD-10-CM

## 2017-06-20 DIAGNOSIS — R7303 Prediabetes: Secondary | ICD-10-CM

## 2017-06-20 DIAGNOSIS — I10 Essential (primary) hypertension: Secondary | ICD-10-CM | POA: Diagnosis not present

## 2017-06-20 DIAGNOSIS — Z130 Encounter for screening for diseases of the blood and blood-forming organs and certain disorders involving the immune mechanism: Secondary | ICD-10-CM | POA: Diagnosis not present

## 2017-06-20 DIAGNOSIS — R0989 Other specified symptoms and signs involving the circulatory and respiratory systems: Secondary | ICD-10-CM

## 2017-06-20 DIAGNOSIS — Z1389 Encounter for screening for other disorder: Secondary | ICD-10-CM

## 2017-06-20 DIAGNOSIS — R5383 Other fatigue: Secondary | ICD-10-CM

## 2017-06-20 DIAGNOSIS — K2100 Gastro-esophageal reflux disease with esophagitis, without bleeding: Secondary | ICD-10-CM

## 2017-06-20 DIAGNOSIS — E782 Mixed hyperlipidemia: Secondary | ICD-10-CM

## 2017-06-20 DIAGNOSIS — Z13220 Encounter for screening for lipoid disorders: Secondary | ICD-10-CM | POA: Diagnosis not present

## 2017-06-20 DIAGNOSIS — Z79899 Other long term (current) drug therapy: Secondary | ICD-10-CM

## 2017-06-20 NOTE — Telephone Encounter (Signed)
Pt called to advise she spoke with the insurance co and got everything straightened out. Please contact her when all is clear with an appt date. THank you

## 2017-06-21 ENCOUNTER — Ambulatory Visit: Payer: Self-pay | Admitting: Neurology

## 2017-06-21 LAB — BASIC METABOLIC PANEL WITH GFR
BUN: 14 mg/dL (ref 7–25)
CO2: 30 mmol/L (ref 20–32)
Calcium: 10.6 mg/dL — ABNORMAL HIGH (ref 8.6–10.4)
Chloride: 103 mmol/L (ref 98–110)
Creat: 0.77 mg/dL (ref 0.50–1.05)
GFR, Est African American: 99 mL/min/{1.73_m2} (ref >=60)
GFR, Est Non African American: 85 mL/min/{1.73_m2} (ref >=60)
Glucose, Bld: 89 mg/dL (ref 65–99)
Potassium: 4.1 mmol/L (ref 3.5–5.3)
Sodium: 141 mmol/L (ref 135–146)

## 2017-06-21 LAB — CBC WITH DIFFERENTIAL/PLATELET
Basophils Absolute: 28 cells/uL (ref 0–200)
Basophils Relative: 0.6 %
Eosinophils Absolute: 188 cells/uL (ref 15–500)
Eosinophils Relative: 4 %
HCT: 46.7 % — ABNORMAL HIGH (ref 35.0–45.0)
Hemoglobin: 16.5 g/dL — ABNORMAL HIGH (ref 11.7–15.5)
Lymphs Abs: 1645 cells/uL (ref 850–3900)
MCH: 31.9 pg (ref 27.0–33.0)
MCHC: 35.3 g/dL (ref 32.0–36.0)
MCV: 90.3 fL (ref 80.0–100.0)
MPV: 11.2 fL (ref 7.5–12.5)
Monocytes Relative: 8.1 %
Neutro Abs: 2458 cells/uL (ref 1500–7800)
Neutrophils Relative %: 52.3 %
Platelets: 197 10*3/uL (ref 140–400)
RBC: 5.17 10*6/uL — ABNORMAL HIGH (ref 3.80–5.10)
RDW: 12.1 % (ref 11.0–15.0)
Total Lymphocyte: 35 %
WBC mixed population: 381 cells/uL (ref 200–950)
WBC: 4.7 10*3/uL (ref 3.8–10.8)

## 2017-06-21 LAB — LIPID PANEL
Cholesterol: 235 mg/dL — ABNORMAL HIGH (ref <200)
HDL: 64 mg/dL (ref >50)
LDL Cholesterol (Calc): 129 mg/dL (calc) — ABNORMAL HIGH
Non-HDL Cholesterol (Calc): 171 mg/dL (calc) — ABNORMAL HIGH (ref <130)
Total CHOL/HDL Ratio: 3.7 (calc) (ref <5.0)
Triglycerides: 296 mg/dL — ABNORMAL HIGH (ref <150)

## 2017-06-21 LAB — URINALYSIS, ROUTINE W REFLEX MICROSCOPIC
Bilirubin Urine: NEGATIVE
Glucose, UA: NEGATIVE
Hgb urine dipstick: NEGATIVE
Ketones, ur: NEGATIVE
Leukocytes, UA: NEGATIVE
Nitrite: NEGATIVE
Protein, ur: NEGATIVE
Specific Gravity, Urine: 1.017 (ref 1.001–1.03)
pH: 6.5 (ref 5.0–8.0)

## 2017-06-21 LAB — MICROALBUMIN / CREATININE URINE RATIO
Creatinine, Urine: 99 mg/dL (ref 20–275)
Microalb Creat Ratio: 6 ug/mg{creat}
Microalb, Ur: 0.6 mg/dL

## 2017-06-21 LAB — VITAMIN D 25 HYDROXY (VIT D DEFICIENCY, FRACTURES): Vit D, 25-Hydroxy: 69 ng/mL (ref 30–100)

## 2017-06-21 LAB — HEMOGLOBIN A1C
Hgb A1c MFr Bld: 5.6 % of total Hgb (ref <5.7)
Mean Plasma Glucose: 114 (calc)
eAG (mmol/L): 6.3 (calc)

## 2017-06-21 LAB — IRON, TOTAL/TOTAL IRON BINDING CAP
%SAT: 63 % (calc) — ABNORMAL HIGH (ref 11–50)
Iron: 197 ug/dL — ABNORMAL HIGH (ref 45–160)
TIBC: 312 mcg/dL (calc) (ref 250–450)

## 2017-06-21 LAB — HEPATIC FUNCTION PANEL
AG Ratio: 1.8 (calc) (ref 1.0–2.5)
ALT: 83 U/L — ABNORMAL HIGH (ref 6–29)
AST: 35 U/L (ref 10–35)
Albumin: 4.8 g/dL (ref 3.6–5.1)
Alkaline phosphatase (APISO): 105 U/L (ref 33–130)
Bilirubin, Direct: 0.1 mg/dL (ref 0.0–0.2)
Globulin: 2.6 g/dL (calc) (ref 1.9–3.7)
Indirect Bilirubin: 0.6 mg/dL (calc) (ref 0.2–1.2)
Total Bilirubin: 0.7 mg/dL (ref 0.2–1.2)
Total Protein: 7.4 g/dL (ref 6.1–8.1)

## 2017-06-21 LAB — TSH: TSH: 0.76 mIU/L (ref 0.40–4.50)

## 2017-06-21 LAB — INSULIN, RANDOM: Insulin: 15.6 u[IU]/mL (ref 2.0–19.6)

## 2017-06-21 LAB — VITAMIN B12: Vitamin B-12: 498 pg/mL (ref 200–1100)

## 2017-06-21 LAB — MAGNESIUM: Magnesium: 1.9 mg/dL (ref 1.5–2.5)

## 2017-06-21 NOTE — Addendum Note (Signed)
Addended by: Valrie HartEVANS, Ricardo Schubach C on: 06/21/2017 08:20 AM   Modules accepted: Orders

## 2017-06-22 ENCOUNTER — Other Ambulatory Visit: Payer: Self-pay | Admitting: Internal Medicine

## 2017-06-23 NOTE — Telephone Encounter (Signed)
I called the pharmacy to check status of medication. [facility] was still pending insurance verification.

## 2017-06-28 ENCOUNTER — Telehealth: Payer: Self-pay | Admitting: Neurology

## 2017-06-28 ENCOUNTER — Other Ambulatory Visit: Payer: Self-pay | Admitting: Internal Medicine

## 2017-06-28 DIAGNOSIS — F988 Other specified behavioral and emotional disorders with onset usually occurring in childhood and adolescence: Secondary | ICD-10-CM

## 2017-06-28 MED ORDER — AMPHETAMINE-DEXTROAMPHETAMINE 10 MG PO TABS: ORAL_TABLET | ORAL | 0 refills | Status: DC

## 2017-06-28 NOTE — Telephone Encounter (Signed)
Pt has called and is asking that Danielle calls her re: Botox.

## 2017-06-28 NOTE — Telephone Encounter (Signed)
I called to check status of medication at the pharmacy and it is still pending.   [employer]  [id number]. [id number] [phone]

## 2017-06-29 NOTE — Telephone Encounter (Addendum)
I called Ruth Gregory to update her on the botox but she did not answer I could not leave a VM because her inbox was full.

## 2017-06-30 ENCOUNTER — Telehealth: Payer: Self-pay | Admitting: *Deleted

## 2017-06-30 DIAGNOSIS — Z0279 Encounter for issue of other medical certificate: Secondary | ICD-10-CM

## 2017-06-30 NOTE — Telephone Encounter (Addendum)
Pt [employer] form on [employer]. pt mutual form @ front desk for p/u

## 2017-07-04 ENCOUNTER — Telehealth: Payer: Self-pay | Admitting: *Deleted

## 2017-07-05 NOTE — Telephone Encounter (Signed)
I called the pharmacy to check status of the medication. The medication is still pending insurance verification.   I called the patient to inform her but she did not answer and her mailbox was full.

## 2017-07-05 NOTE — Telephone Encounter (Signed)
Please refer to previous phone note.   I called the pharmacy to check status of the medication. The medication is still pending insurance verification.   I called the patient to inform her but she did not answer and her mailbox was full.

## 2017-07-11 ENCOUNTER — Other Ambulatory Visit: Payer: Self-pay | Admitting: Internal Medicine

## 2017-07-11 ENCOUNTER — Encounter: Payer: Self-pay | Admitting: Internal Medicine

## 2017-07-11 DIAGNOSIS — B029 Zoster without complications: Secondary | ICD-10-CM

## 2017-07-14 ENCOUNTER — Other Ambulatory Visit: Payer: Self-pay | Admitting: Internal Medicine

## 2017-07-14 NOTE — Telephone Encounter (Signed)
I spoke with the patient regarding her botox. She says that the pharmacy is telling her she has been denied for her injections and they need a letter from our office. I told her I would call the pharmacy and find out what they need from us.   I called the pharmacy and spoke with a representative. She stated that they were missing the patients gender and the relationship to the person who was primarily covered. I gave them all of this information. They informed me that the medication also needed a PA. I told them I would call the insurance and obtain one.   I called [employer] and was told there was already a submitted authorization. Authorization number [id number] (4 visits 300 units every 12 weeks) (03/29/17-03/28/18).   I called the pharmacy and spoke with Mia.She transferred me to insurance department and I provided the information needed.   I called the patient to make them aware of this.

## 2017-07-20 NOTE — Telephone Encounter (Signed)
Ky with [employer] [place] [employer] is calling regarding PA for Botox. She can be reached at [phone] Ext. [phone].

## 2017-07-20 NOTE — Telephone Encounter (Signed)
[facility] and stated that the approval was valid and good.

## 2017-07-20 NOTE — Telephone Encounter (Signed)
I called to check status on this patients Botox. It was still pending. This was transferred to a supervisor, Barbara CowerJason. He told the representative that he would work on this and take care of it today. I gave them my direct number and the representative stated she would call me back directly once its complete.

## 2017-07-20 NOTE — Telephone Encounter (Signed)
Patients husband called and said he received a denial letter from their insurance for the botox. He called and spoke with [facility] who stated it was because they were waiting for information from our office. I informed him I received an approval on 04/18. I called the number below back and spoke with Amy. I gave her the PA reference number and she requested to give me a call back.

## 2017-07-20 NOTE — Telephone Encounter (Signed)
I [facility] who said there were two auth request on file for this patient, one denied, and one approved but she did not see the J code on the approved one. I told her previous rep had verfied 300 units of Botox every 3 months. She was going to check this and call me back.   I called the pharmacy to check status and they said it was still processing with insurance. It was sent to a supervisor and I was guaranteed it would be done within the next 24 hours.

## 2017-07-21 NOTE — Telephone Encounter (Signed)
I called to check the status of the medication, it is ready to ship but waiting on patient consent. I tried to reach out to the patient but she did not answer.

## 2017-07-25 DIAGNOSIS — G43719 Chronic migraine without aura, intractable, without status migrainosus: Secondary | ICD-10-CM | POA: Diagnosis not present

## 2017-07-25 NOTE — Telephone Encounter (Signed)
I called and spoke with the patient, she states that she has already given consent. I called and scheduled delivery from [facility] for 07/27/17.

## 2017-08-02 ENCOUNTER — Telehealth: Payer: Self-pay | Admitting: Neurology

## 2017-08-02 NOTE — Telephone Encounter (Signed)
Pt called said on the left side where botox is given the 2nd digit in curling under, she said that has never done that before. She has noticed it over the past 2 months with wearing shoes. She wanted it documented for her botox visit on 08/08/17.

## 2017-08-08 ENCOUNTER — Other Ambulatory Visit: Payer: Self-pay | Admitting: Internal Medicine

## 2017-08-08 ENCOUNTER — Encounter: Payer: Self-pay | Admitting: Neurology

## 2017-08-08 ENCOUNTER — Other Ambulatory Visit: Payer: Self-pay

## 2017-08-08 ENCOUNTER — Ambulatory Visit (INDEPENDENT_AMBULATORY_CARE_PROVIDER_SITE_OTHER): Payer: BLUE CROSS/BLUE SHIELD | Admitting: Neurology

## 2017-08-08 ENCOUNTER — Telehealth: Payer: Self-pay | Admitting: Neurology

## 2017-08-08 VITALS — BP 138/84 | HR 97 | Ht 68.5 in | Wt 164.0 lb

## 2017-08-08 DIAGNOSIS — Z1211 Encounter for screening for malignant neoplasm of colon: Secondary | ICD-10-CM

## 2017-08-08 DIAGNOSIS — G43019 Migraine without aura, intractable, without status migrainosus: Secondary | ICD-10-CM

## 2017-08-08 DIAGNOSIS — Z1212 Encounter for screening for malignant neoplasm of rectum: Principal | ICD-10-CM

## 2017-08-08 DIAGNOSIS — I69954 Hemiplegia and hemiparesis following unspecified cerebrovascular disease affecting left non-dominant side: Secondary | ICD-10-CM

## 2017-08-08 DIAGNOSIS — I63031 Cerebral infarction due to thrombosis of right carotid artery: Secondary | ICD-10-CM

## 2017-08-08 LAB — POC HEMOCCULT BLD/STL (HOME/3-CARD/SCREEN)
Card #2 Fecal Occult Blod, POC: NEGATIVE
Card #3 Fecal Occult Blood, POC: NEGATIVE
Fecal Occult Blood, POC: NEGATIVE

## 2017-08-08 MED ORDER — TOPIRAMATE 100 MG PO TABS: 100.0000 mg | ORAL_TABLET | Freq: Two times a day (BID) | ORAL | 4 refills | Status: DC

## 2017-08-08 MED ORDER — TRAMADOL HCL 50 MG PO TABS: 50.0000 mg | ORAL_TABLET | Freq: Four times a day (QID) | ORAL | 3 refills | Status: DC | PRN

## 2017-08-08 NOTE — Telephone Encounter (Signed)
12 wk Botox inj °

## 2017-08-08 NOTE — Telephone Encounter (Signed)
Please give her an appt in 3 months for BOTOX injection

## 2017-08-08 NOTE — Progress Notes (Signed)
Botox 100U x 4 vials. [facility] pharmacy.

## 2017-08-08 NOTE — Progress Notes (Signed)
Chief Complaint  Patient presents with  . Botulinum Toxin Injection    specialty pharmacy.       PATIENT: Ruth Gregory DOB: 1961-01-12  Chief Complaint  Patient presents with  . Botulinum Toxin Injection    specialty pharmacy.      HISTORICAL  Ruth Gregory is a 57 year old right-handed female  History of right internal carotid section with spastic left hemiparesis, Botox injection every 3 months  She has history of right internal carotid artery dissection following an episode of vomiting from migraine headache in mid June 2009. She was given IV TPA and found to have right MCA occlusion. She underwent emergent right carotid artery stent with distal endovascular recanalization of the middle cerebral artery.  She had small hemorrhagic transformation of the right basal ganglia with mild left hemiparesis, but has done well since then and has been independent.  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  She has been receiving BOTOX injection since 01/2009 every 3 months, to her left upper extremity and also left lower extremity, responded well.  Repeat US carotid were normal in October 2013  Chronic migraine headaches: She had long-standing history of chronic migraine, average 2-4 headaches each months, she has increased frequency headaches around fall, and spring, she is now taking Topamax ER 150 mg every night as preventive medication, magnesium oxide 400 mg, riboflavin 100 mg twice a  day,  She is not candidate for triptan because previous history of stroke, Cambia works well for her, she her insurance only allow her to have 9 tablets each months. Occasionally she take Fioricet, hydrocodone as needed, even prednisone 20 mg as needed as rescue therapy  Over the past few weeks, she been having to 3 headaches each week, used out her cambia, she also complains of neck muscle tightness, receiving chiropractor sometimes   Last Botox injection to spastic left upper extremity works well, but she noticed left hand muscle weakness.  UPDATE Mar 26 2015: She responded very well to previous Botox injection in September 2016, but noticed increased left hand weakness, gradually recovered over the past few weeks  Update July 15 2015: She responded very well to previous Botox injection in December 20 eighth 2016, noticed returning of left finger flexion spasticity, left ankle plantarflexion, want today's injection emphasize on above abnormal posturing.  She still has frequent migraine headaches, 1-2 headache each week, some headache was protracted, lasting 1-2 days, require multiple dose of cambia and Fioricet  Update November 20 2015: Patient called in September 09 2015 "She also said the left hand is very weak- 12,3 digits > 4,5. She said last OV she rec'd botox in wrist. She sts she may not be able to get botox next month if she is not feeling better"  She has a lot of pain on her left leg, mainly her left ankle from knee down, throbbing, tooth ache, she noticed more tendency of left ankle plantar flexion.  She continue have intermittent migraine headaches, more at the left retro-orbital  Area  UPDATE Dec 6th 2017: She responded very well to previous Botox injection, complains frequent migraine headaches, once a week, lasting 1-2 days, partial response to combination of cambia, Fioricet, Compazine, she is taking Topamax ER 150 mg as preventive medications  UPDATE June 30 2016: She had her  first Botox injection for migraine prevention in December 2017, with only used 75 units she reported dramatic improvement, she only has migraines about once a week, which is milder, better controlled by the medication, instead of 2-3 times more severe headaches, at the end of the benefit, she noticed recurrent of her headaches, she also noticed mild left shoulder stiffness, left ankle turned in,  She noticed worsening right knee pain, recently had right knee injection.  Update November 04 2016: She responded well to previous injection, Botox injection to bilateral frontal temporal region has helped her headache,  UPDATE Mar 11 2017: She responded well to previous injection in August 2018.  Reported increased migraine headache over the past few weeks,  UPDATE Aug 08 2017: She is having migraine almost every week, lasting for 2 days, she is hesitate   Review of system: Pertinent as above   ALLERGIES: Allergies  Allergen Reactions  . Penicillins Anaphylaxis and Hives  . Imitrex [Sumatriptan]   . Other Swelling    kiwi  . Sulfa Antibiotics Hives  . Zomig [Zolmitriptan]     HOME MEDICATIONS: Current Outpatient Medications  Medication Sig Dispense Refill  . albuterol (PROVENTIL HFA) 108 (90 Base) MCG/ACT inhaler 1 to 2 inhalations 10-15 minutes apart every 4 hours if needed for asthma rescue 48 g 3  . aspirin EC 81 MG tablet Take 81 mg by mouth daily.    . Botulinum Toxin Type A (BOTOX) 200 UNITS SOLR Inject as directed. Change to BOTOX A 100  units    . buPROPion (WELLBUTRIN XL) 300 MG 24 hr tablet TAKE 1 TABLET (300 MG TOTAL) BY MOUTH DAILY. WITH FOOD 90 tablet 3  . butalbital-acetaminophen-caffeine (FIORICET, ESGIC) 50-325-40 MG tablet TAKE 1 TABLET BY MOUTH EVERY 6 HOURS AS NEEDED FOR HEADACHE 30 tablet 5  . CAMBIA 50 MG PACK TAKE 1 PACKET BY MOUTH AS NEEDED. 27 each 3  . cetirizine (ZYRTEC) 10 MG tablet Take 10 mg by mouth daily.    . Diclofenac Potassium (CAMBIA) 50 MG PACK Take one  packet at onset of migraine.  May repeat once in two hours, if needed.  Max dose 2 packets per day.  #27/90days 27 each 3  . Diclofenac Sodium (PENNSAID) 2 % SOLN Place 1 Squirt onto the skin 2 (two) times daily. 1 Bottle 0  . fenofibrate micronized (LOFIBRA) 134 MG capsule TAKE 1 CAPSULE EVERY DAY 90 capsule 3  . fluticasone furoate-vilanterol (BREO ELLIPTA) 100-25 MCG/INH AEPB 1 Inhalation daily to prevent Asthma 180 each 3  . levothyroxine (SYNTHROID, LEVOTHROID) 100 MCG tablet TAKE 1 TABLET EVERY DAY 90 tablet 1  . magnesium oxide (MAG-OX) 400 MG tablet Take 400 mg by mouth 2 (two) times daily.     . montelukast (SINGULAIR) 10 MG tablet TAKE 1 TABLET BY MOUTH AT BEDTIME 90 tablet 1  . Multiple Vitamin (MULTIVITAMIN WITH MINERALS) TABS tablet Take 1 tablet by mouth daily.    . nortriptyline (PAMELOR) 25 MG capsule Take 2 capsules (50 mg total) by mouth at bedtime. 180 capsule 3  . omeprazole (PRILOSEC) 40 MG capsule TAKE 1 CAPSULE (40 MG TOTAL) BY MOUTH DAILY. FOR ACID INDIGESTION AND REFLUX 30 capsule 5  . predniSONE (DELTASONE)  20 MG tablet TAKE 1 TAB 3 X DAY FOR 2 DAYS, THEN 1 TAB 2 X DAY FOR 2 DAYS, THEN 1 TAB 1 X DAY FOR 3 DAYS 13 tablet 0  . prochlorperazine (COMPAZINE) 5 MG tablet TAKE 1 TABLET BY MOUTH 3 TIMES A DAY AS NEEDED FOR NAUSEA 50 tablet 1  . Riboflavin 100 MG TABS Take 100 mg by mouth. bid    . rosuvastatin (CRESTOR) 40 MG tablet     . tiZANidine (ZANAFLEX) 2 MG tablet TAKE 1 TABLET (2 MG TOTAL) BY MOUTH EVERY 6 (SIX) HOURS AS NEEDED FOR MUSCLE SPASMS. 30 tablet 6  . Topiramate ER (TROKENDI XR) 200 MG CP24 Take 200 mg by mouth at bedtime. 30 capsule 11  . vitamin B-12 (CYANOCOBALAMIN) 1000 MCG tablet Take 1,000 mcg by mouth daily.    . Vitamin D, Ergocalciferol, (DRISDOL) 50000 units CAPS capsule TAKE 1 CAPSULE DAILY 30 capsule 1  . amphetamine-dextroamphetamine (ADDERALL) 10 MG tablet Take 1/2 to 1 tablet 1 or 2 x daily for ADD 60 tablet 0   No current facility-administered  medications for this visit.     PAST MEDICAL HISTORY: Past Medical History:  Diagnosis Date  . Anemia   . Anxiety   . Asthma   . Chronic heartburn   . Depression   . Elevated cholesterol    on Crestor  . Headache(784.0)   . Hypertension   . Hypothyroidism    S/P thyroidectomy  . Migraine   . Occlusion and stenosis of carotid artery with cerebral infarction   . Spasm of muscle   . Unspecified cerebral artery occlusion with cerebral infarction   . Vitamin D deficiency     PAST SURGICAL HISTORY: Past Surgical History:  Procedure Laterality Date  . AUGMENTATION MAMMAPLASTY Bilateral 2000  . COSMETIC SURGERY    . CYSTOCELE REPAIR N/A 01/08/2013   Procedure: ANTERIOR REPAIR (CYSTOCELE);  Surgeon: Meriel Pica, MD;  Location: [facility];  Service: Gynecology;  Laterality: N/A;  . NASAL SINUS SURGERY    . VAGINAL HYSTERECTOMY      FAMILY HISTORY: Family History  Problem Relation Age of Onset  . Heart disease Mother   . Peptic Ulcer Disease Mother   . Heart disease Father   . Parkinson's disease Father     SOCIAL HISTORY:  Social History   Socioeconomic History  . Marital status: Married    Spouse name: Brett Canales  . Number of children: 2  . Years of education: college  . Highest education level: Not on file  Occupational History  . Occupation: DESIGNER    Employer: SELF  Social Needs  . Financial resource strain: Not on file  . Food insecurity:    Worry: Not on file    Inability: Not on file  . Transportation needs:    Medical: Not on file    Non-medical: Not on file  Tobacco Use  . Smoking status: Never Smoker  . Smokeless tobacco: Never Used  Substance and Sexual Activity  . Alcohol use: Yes    Alcohol/week: 0.0 oz    Comment: occassionally drinks a glass of wine  . Drug use: No  . Sexual activity: Not on file  Lifestyle  . Physical activity:    Days per week: Not on file    Minutes per session: Not on file  . Stress: Not on file  Relationships  .  Social connections:    Talks on phone: Not on file    Gets together: Not on  file    Attends religious service: Not on file    Active member of club or organization: Not on file    Attends meetings of clubs or organizations: Not on file    Relationship status: Not on file  . Intimate partner violence:    Fear of current or ex partner: Not on file    Emotionally abused: Not on file    Physically abused: Not on file    Forced sexual activity: Not on file  Other Topics Concern  . 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 Patient is married and lives with her husband. Patient has college education.   Right handed.   Caffeine- two cups daily.     PHYSICAL EXAM   Vitals:   08/08/17 1138  BP: 138/84  Pulse: 97  Weight: 164 lb (74.4 kg)  Height: 5' 8.5" (1.74 m)    Not recorded      Body mass index is 24.57 kg/m.  PHYSICAL EXAMNIATION:  Gen: NAD, conversant, well nourised, obese, well groomed                     Cardiovascular: Regular rate rhythm, no peripheral edema, warm, nontender. Eyes: Conjunctivae clear without exudates or hemorrhage Neck: Supple, no carotid bruise. Pulmonary: Clear to auscultation bilaterally   NEUROLOGICAL EXAM:  MENTAL STATUS: Speech:    Speech is normal; fluent and spontaneous with normal comprehension.  Cognition:     Orientation to time, place and person     Normal recent and remote memory     Normal Attention span and concentration     Normal Language, naming, repeating,spontaneous speech     Fund of knowledge   CRANIAL NERVES: CN II: Visual fields are full to confrontation. Fundoscopic exam is normal with sharp discs and no vascular changes. Pupils are round equal and briskly reactive to light. CN III, IV, VI: extraocular movement are normal. No ptosis. CN V: Facial sensation is intact to pinprick in all 3 divisions bilaterally. Corneal responses are intact.  CN VII: Face is  symmetric with normal eye closure and smile. CN VIII: Hearing is normal to rubbing fingers CN IX, X: Palate elevates symmetrically. Phonation is normal. CN XI: Head turning and shoulder shrug are intact CN XII: Tongue is midline with normal movements and no atrophy.  MOTOR: Mild spastic left hemiparesis, left upper extremity proximal and distal 4/5, mild left hip flexion and left ankle dorsiflexion weakness  REFLEXES: Reflexes are 2+ and symmetric at the biceps, triceps, knees, and ankles. Plantar responses are flexor.  SENSORY: Intact to light touch, pinprick, position sense, and vibration sense are intact in fingers and toes.  COORDINATION: Rapid alternating movements and fine finger movements are intact. There is no dysmetria on finger-to-nose and heel-knee-shin.    GAIT/STANCE: Mildly unsteady gait, checking her left foot across the floor  Romberg is absent.   DIAGNOSTIC DATA (LABS, IMAGING, TESTING) - I reviewed patient records, labs, notes, testing and imaging myself where available.   ASSESSMENT AND PLAN  Nyleah Mcginnis is a 57 y.o. female   Chronic migraine headaches  Continue preventive medications, extended release topiramate 150 mg every day  Cambia, Fioricet, Compazine as needed,+ tizanidine prn  Continue magnesium oxide, riboflavin as preventive medications,  Add on nortriptyline 25 mg titrating to 50 every night as migraine prevention   Spastic left hemiparesis following right internal carotid artery dissection, right MCA stroke  Electrical  stimulation guided Botox injection, used 300 units of Botox A  Left pronator teres 25 units Left flexor carpi ulnaris 25 units Left flexor digitorum profoundi 25 units Left pectoralis major 25 units Left palmaris longus 25 units  Left teres major 25 units Left latissimus dorsi 25 units Left brachialis 25 units  Left tibialis posterior 50 units Left flexor digitorum longus 25 unitsx2=50 units  I also injected  200  units for chronic migraine headache  Right Corrugate 5 units Left  Corrugate 5 units Proceurus 5 units Frontalis 20 Temporalis 40 units Upper trapezius 30 units Occipital 25 units Bilateral cervical paraspinal muscles 50 units 20 units to bilateral vortex region   Levert Feinstein, M.D. Ph.D.  [facility] [address], [address] [place], [place] [address] Ph: [phone] Fax: [phone]  CC: Referring Provider

## 2017-08-09 DIAGNOSIS — Z1211 Encounter for screening for malignant neoplasm of colon: Secondary | ICD-10-CM | POA: Diagnosis not present

## 2017-08-11 NOTE — Telephone Encounter (Signed)
I called to schedule the patient but her phone went straight to VM. I left a VM asking her to call me back.

## 2017-08-16 ENCOUNTER — Other Ambulatory Visit: Payer: Self-pay | Admitting: Internal Medicine

## 2017-08-16 DIAGNOSIS — Z1231 Encounter for screening mammogram for malignant neoplasm of breast: Secondary | ICD-10-CM

## 2017-08-17 NOTE — Progress Notes (Signed)
Assessment and Plan:  Dysuria Push fluids, defer abx until culture results - recently was treated by levofloxacin for UTI in March Stop taking baths with added scents Stop new lubrication product -     Urinalysis w microscopic + reflex cultur  Further disposition pending results of labs. Discussed med's effects and SE's.   Over 15 minutes of exam, counseling, chart review, and critical decision making was performed.   Future Appointments  Date Time Provider Department Center  09/08/2017  1:00 PM [facility] 2 [facility]  10/07/2017 11:00 AM Judd Gaudier, NP [facility] None  01/09/2018 11:00 AM Lucky Cowboy, MD [facility] None  07/27/2018  3:00 PM Lucky Cowboy, MD [facility] None    ------------------------------------------------------------------------------------------------------------------   HPI 57 y.o.female presents presents for evaluation of urinary symptoms.   Patient complains of dysuria and frequency. She has had symptoms for 10 days. Patient also complains of dark urine. Patient denies back pain, stomach ache and vaginal discharge, fever/chills, N/V/D, fever/chills, pain with intercourse, perineal discomfort. No new sexual partners or hx of STIs.  Patient does not have a history of recurrent UTI. Patient does not have a history of pyelonephritis. She had her first UTI earlier this year in March and was reportedly treated by levofloxacin. She reports she has been taking regular baths with bath soaps (eucalyptus) as well as a new lubricant product.    Past Medical History:  Diagnosis Date  . Anemia   . Anxiety   . Asthma   . Chronic heartburn   . Depression   . Elevated cholesterol    on Crestor  . Headache(784.0)   . Hypertension   . Hypothyroidism    S/P thyroidectomy  . Migraine   . Occlusion and stenosis of carotid artery with cerebral infarction   . Spasm of muscle   . Unspecified cerebral artery occlusion with cerebral infarction   .  Vitamin D deficiency      Allergies  Allergen Reactions  . Penicillins Anaphylaxis and Hives  . Imitrex [Sumatriptan]   . Other Swelling    kiwi  . Sulfa Antibiotics Hives  . Zomig [Zolmitriptan]     Current Outpatient Medications on File Prior to Visit  Medication Sig  . albuterol (PROVENTIL HFA) 108 (90 Base) MCG/ACT inhaler 1 to 2 inhalations 10-15 minutes apart every 4 hours if needed for asthma rescue  . ALPRAZolam (XANAX) 1 MG tablet TAKE 1/2 TO 1 TABLET 2 TO 3 X / DAY AND PLEASE TRY TO LIMIT TO 5 DAYS /WEEK TO AVOID ADDICTION (Patient taking differently: TAKE 1/2 TO 1 TABLET 2 TO 3 X / DAY AS NEEDED AND PLEASE TRY TO LIMIT TO 5 DAYS /WEEK TO AVOID ADDICTION)  . amphetamine-dextroamphetamine (ADDERALL) 10 MG tablet Take 1/2 to 1 tablet 1 or 2 x daily for ADD  . aspirin EC 81 MG tablet Take 81 mg by mouth daily.  . Botulinum Toxin Type A (BOTOX) 200 UNITS SOLR Inject as directed. Change to BOTOX A 100  units  . buPROPion (WELLBUTRIN XL) 300 MG 24 hr tablet TAKE 1 TABLET (300 MG TOTAL) BY MOUTH DAILY. WITH FOOD  . butalbital-acetaminophen-caffeine (FIORICET, ESGIC) 50-325-40 MG tablet TAKE 1 TABLET BY MOUTH EVERY 6 HOURS AS NEEDED FOR HEADACHE  . CAMBIA 50 MG PACK TAKE 1 PACKET BY MOUTH AS NEEDED.  Marland Kitchen cetirizine (ZYRTEC) 10 MG tablet Take 10 mg by mouth daily.  . Diclofenac Potassium (CAMBIA) 50 MG PACK Take one packet at onset of migraine.  May repeat  once in two hours, if needed.  Max dose 2 packets per day.  #27/90days  . Diclofenac Sodium (PENNSAID) 2 % SOLN Place 1 Squirt onto the skin 2 (two) times daily.  . fenofibrate micronized (LOFIBRA) 134 MG capsule TAKE 1 CAPSULE EVERY DAY  . fluticasone furoate-vilanterol (BREO ELLIPTA) 100-25 MCG/INH AEPB 1 Inhalation daily to prevent Asthma  . levothyroxine (SYNTHROID, LEVOTHROID) 100 MCG tablet TAKE 1 TABLET EVERY DAY  . magnesium oxide (MAG-OX) 400 MG tablet Take 400 mg by mouth 2 (two) times daily.   . montelukast (SINGULAIR) 10 MG  tablet TAKE 1 TABLET BY MOUTH AT BEDTIME  . Multiple Vitamin (MULTIVITAMIN WITH MINERALS) TABS tablet Take 1 tablet by mouth daily.  . nortriptyline (PAMELOR) 25 MG capsule Take 2 capsules (50 mg total) by mouth at bedtime.  Marland Kitchen omeprazole (PRILOSEC) 40 MG capsule TAKE 1 CAPSULE (40 MG TOTAL) BY MOUTH DAILY. FOR ACID INDIGESTION AND REFLUX  . predniSONE (DELTASONE) 20 MG tablet TAKE 1 TAB 3 X DAY FOR 2 DAYS, THEN 1 TAB 2 X DAY FOR 2 DAYS, THEN 1 TAB 1 X DAY FOR 3 DAYS  . prochlorperazine (COMPAZINE) 5 MG tablet TAKE 1 TABLET BY MOUTH 3 TIMES A DAY AS NEEDED FOR NAUSEA  . Riboflavin 100 MG TABS Take 100 mg by mouth. bid  . rosuvastatin (CRESTOR) 40 MG tablet   . tiZANidine (ZANAFLEX) 2 MG tablet TAKE 1 TABLET (2 MG TOTAL) BY MOUTH EVERY 6 (SIX) HOURS AS NEEDED FOR MUSCLE SPASMS.  Marland Kitchen topiramate (TOPAMAX) 100 MG tablet Take 1 tablet (100 mg total) by mouth 2 (two) times daily.  . traMADol (ULTRAM) 50 MG tablet Take 1 tablet (50 mg total) by mouth every 6 (six) hours as needed.  . vitamin B-12 (CYANOCOBALAMIN) 1000 MCG tablet Take 1,000 mcg by mouth daily.  . Vitamin D, Ergocalciferol, (DRISDOL) 50000 units CAPS capsule TAKE 1 CAPSULE DAILY   No current facility-administered medications on file prior to visit.     ROS: all negative except above.   Physical Exam:  BP 98/62   Pulse 86   Temp (!) 97.2 F (36.2 C)   Ht 5' 8.5" (1.74 m)   Wt 167 lb (75.8 kg)   SpO2 93%   BMI 25.02 kg/m   General Appearance: Well nourished, in no apparent distress. Eyes: PERRLA, EOMs, conjunctiva no swelling or erythema ENT/Mouth:  No erythema, swelling, or exudate on post pharynx.  Tonsils not swollen or erythematous. Hearing normal.  Neck: Supple, thyroid normal.  Respiratory: Respiratory effort normal, BS equal bilaterally without rales, rhonchi, wheezing or stridor.  Cardio: RRR with no MRGs. Brisk peripheral pulses without edema.  Abdomen: Soft, + BS.  Non tender, no guarding, rebound. Lymphatics: Non  tender without lymphadenopathy.  Musculoskeletal: normal gait.  Skin: Warm, dry without rashes, lesions, ecchymosis.  Psych: Awake and oriented X 3, normal affect, Insight and Judgment appropriate.     Dan Maker, NP 2:51 PM [facility]

## 2017-08-18 ENCOUNTER — Encounter: Payer: Self-pay | Admitting: Adult Health

## 2017-08-18 ENCOUNTER — Ambulatory Visit (INDEPENDENT_AMBULATORY_CARE_PROVIDER_SITE_OTHER): Payer: BLUE CROSS/BLUE SHIELD | Admitting: Adult Health

## 2017-08-18 ENCOUNTER — Telehealth: Payer: Self-pay | Admitting: Neurology

## 2017-08-18 VITALS — BP 98/62 | HR 86 | Temp 97.2°F | Ht 68.5 in | Wt 167.0 lb

## 2017-08-18 DIAGNOSIS — R300 Dysuria: Secondary | ICD-10-CM | POA: Diagnosis not present

## 2017-08-18 NOTE — Telephone Encounter (Signed)
Spoke to Dr. Terrace Arabia and she has offered the following options:  1) Infusion of Depakote 1gram, Toradol , Compazine  (will need driver)  2) work-in for nerve block this afternoon

## 2017-08-18 NOTE — Telephone Encounter (Signed)
Pt called she's had HA for 2 days with no relief. She was unable to sleep last night. Pain is in the top of the head, light, sound sensitivity. No meds have helped. She is wanting to come in this afternoon for an injection. Please call to advise asap.

## 2017-08-18 NOTE — Telephone Encounter (Signed)
noted 

## 2017-08-18 NOTE — Telephone Encounter (Signed)
Pt called stating she took a hot bath and took all medication, states she is feeling much better and does not want to coming today.

## 2017-08-18 NOTE — Patient Instructions (Signed)

## 2017-08-18 NOTE — Telephone Encounter (Signed)
I have left two message for the patient requesting a call back.

## 2017-08-19 ENCOUNTER — Ambulatory Visit: Payer: BLUE CROSS/BLUE SHIELD

## 2017-08-19 ENCOUNTER — Other Ambulatory Visit: Payer: Self-pay | Admitting: Adult Health

## 2017-08-19 VITALS — BP 138/88

## 2017-08-19 DIAGNOSIS — N390 Urinary tract infection, site not specified: Secondary | ICD-10-CM

## 2017-08-19 DIAGNOSIS — G43919 Migraine, unspecified, intractable, without status migrainosus: Secondary | ICD-10-CM

## 2017-08-19 DIAGNOSIS — R110 Nausea: Secondary | ICD-10-CM

## 2017-08-19 MED ORDER — KETOROLAC TROMETHAMINE 60 MG/2ML IM SOLN: 60.0000 mg | Freq: Once | INTRAMUSCULAR | Status: AC

## 2017-08-19 MED ORDER — KETOROLAC TROMETHAMINE 60 MG/2ML IM SOLN
60.0000 mg | Freq: Once | INTRAMUSCULAR | Status: AC
  Administered 2017-08-19: 60 mg via INTRAMUSCULAR

## 2017-08-19 MED ORDER — PROMETHAZINE HCL 25 MG PO TABS: 25.0000 mg | ORAL_TABLET | Freq: Once | ORAL | Status: DC

## 2017-08-19 MED ORDER — CIPROFLOXACIN HCL 500 MG PO TABS: 500.0000 mg | ORAL_TABLET | Freq: Two times a day (BID) | ORAL | 0 refills | Status: DC

## 2017-08-19 MED ORDER — KETOROLAC TROMETHAMINE 60 MG/2ML IM SOLN: 60.0000 mg | Freq: Once | INTRAMUSCULAR | Status: DC

## 2017-08-19 NOTE — Progress Notes (Signed)
Patient presents to the office for treatment of a migraine. Patient was unable to be seen by Urologist to be treated for this.Patient was given  of Toradol and  of Phenergan in the right upper outer quadrant. Patient tolerated well ithout any complications.

## 2017-08-19 NOTE — Telephone Encounter (Signed)
Pt called she is not feeling well today. She is wanting to know if she could come in this morning for shot. Please call to advise

## 2017-08-19 NOTE — Telephone Encounter (Signed)
I have spoken with Ruth Gregory this morning.  She sts. she was seen by pcp, they offered shot for h/a, but she declined.  Unfortunately, our infusion suite does not have w/i capability this morning.  She is going to call her pcp back to see if she can still have the inj in that office. She will call our office back if needed/fim

## 2017-08-19 NOTE — Progress Notes (Signed)
Patient with migraine x 3 days not responding to typical oral prescriptive agents; followed by neurology but their schedule could not accommodate her to be seen today for migraine cocktail injection. No availability on our schedule for regular OV, will give toradol/promethazine IM inj, strong ER precautions over the weekend, and needs to follow up with neurology.

## 2017-08-20 LAB — URINE CULTURE
MICRO NUMBER:: 90632869
SPECIMEN QUALITY:: ADEQUATE

## 2017-08-20 LAB — URINALYSIS W MICROSCOPIC + REFLEX CULTURE
Bilirubin Urine: NEGATIVE
Glucose, UA: NEGATIVE
Hgb urine dipstick: NEGATIVE
Ketones, ur: NEGATIVE
Nitrites, Initial: NEGATIVE
Protein, ur: NEGATIVE
RBC / HPF: NONE SEEN /HPF (ref 0–2)
Specific Gravity, Urine: 1.016 (ref 1.001–1.03)
Squamous Epithelial / LPF: NONE SEEN /HPF (ref <=5)
WBC, UA: >=60 /HPF — AB (ref 0–5)
pH: 7.5 (ref 5.0–8.0)

## 2017-08-20 LAB — CULTURE INDICATED

## 2017-08-21 ENCOUNTER — Encounter: Payer: Self-pay | Admitting: Physician Assistant

## 2017-08-21 ENCOUNTER — Other Ambulatory Visit: Payer: Self-pay | Admitting: Adult Health

## 2017-08-21 ENCOUNTER — Other Ambulatory Visit: Payer: Self-pay | Admitting: Internal Medicine

## 2017-08-21 DIAGNOSIS — F988 Other specified behavioral and emotional disorders with onset usually occurring in childhood and adolescence: Secondary | ICD-10-CM

## 2017-08-21 DIAGNOSIS — N390 Urinary tract infection, site not specified: Secondary | ICD-10-CM

## 2017-08-21 MED ORDER — AMPHETAMINE-DEXTROAMPHETAMINE 10 MG PO TABS
ORAL_TABLET | ORAL | 0 refills | Status: DC
Start: 2017-08-21 — End: 2017-10-27

## 2017-08-29 ENCOUNTER — Encounter: Payer: Self-pay | Admitting: Adult Health

## 2017-09-07 ENCOUNTER — Ambulatory Visit
Admission: RE | Admit: 2017-09-07 | Discharge: 2017-09-07 | Disposition: A | Discharge status: Home / Self Care | Payer: Self-pay | Source: Ambulatory Visit | Attending: Internal Medicine | Admitting: Internal Medicine

## 2017-09-07 DIAGNOSIS — Z1231 Encounter for screening mammogram for malignant neoplasm of breast: Secondary | ICD-10-CM | POA: Diagnosis not present

## 2017-09-08 ENCOUNTER — Encounter: Payer: Self-pay | Admitting: Adult Health

## 2017-09-08 ENCOUNTER — Ambulatory Visit: Payer: BLUE CROSS/BLUE SHIELD

## 2017-09-12 ENCOUNTER — Other Ambulatory Visit: Payer: Self-pay | Admitting: Adult Health

## 2017-09-25 ENCOUNTER — Other Ambulatory Visit: Payer: Self-pay | Admitting: Neurology

## 2017-10-07 ENCOUNTER — Ambulatory Visit (INDEPENDENT_AMBULATORY_CARE_PROVIDER_SITE_OTHER): Payer: BLUE CROSS/BLUE SHIELD | Admitting: Adult Health

## 2017-10-07 ENCOUNTER — Encounter: Payer: Self-pay | Admitting: Adult Health

## 2017-10-07 VITALS — BP 110/76 | HR 98 | Temp 97.2°F | Ht 68.5 in | Wt 163.0 lb

## 2017-10-07 DIAGNOSIS — K219 Gastro-esophageal reflux disease without esophagitis: Secondary | ICD-10-CM | POA: Insufficient documentation

## 2017-10-07 DIAGNOSIS — E559 Vitamin D deficiency, unspecified: Secondary | ICD-10-CM

## 2017-10-07 DIAGNOSIS — Z6825 Body mass index (BMI) 25.0-25.9, adult: Secondary | ICD-10-CM | POA: Diagnosis not present

## 2017-10-07 DIAGNOSIS — E782 Mixed hyperlipidemia: Secondary | ICD-10-CM

## 2017-10-07 DIAGNOSIS — F419 Anxiety disorder, unspecified: Secondary | ICD-10-CM | POA: Insufficient documentation

## 2017-10-07 DIAGNOSIS — Z79899 Other long term (current) drug therapy: Secondary | ICD-10-CM

## 2017-10-07 DIAGNOSIS — E890 Postprocedural hypothyroidism: Secondary | ICD-10-CM

## 2017-10-07 DIAGNOSIS — R7303 Prediabetes: Secondary | ICD-10-CM

## 2017-10-07 DIAGNOSIS — R030 Elevated blood-pressure reading, without diagnosis of hypertension: Secondary | ICD-10-CM

## 2017-10-07 DIAGNOSIS — F988 Other specified behavioral and emotional disorders with onset usually occurring in childhood and adolescence: Secondary | ICD-10-CM

## 2017-10-07 LAB — COMPLETE METABOLIC PANEL WITH GFR
AG Ratio: 2.4 (calc) (ref 1.0–2.5)
ALT: 31 U/L — ABNORMAL HIGH (ref 6–29)
AST: 25 U/L (ref 10–35)
Albumin: 4.7 g/dL (ref 3.6–5.1)
Alkaline phosphatase (APISO): 76 U/L (ref 33–130)
BUN/Creatinine Ratio: 10 (calc) (ref 6–22)
BUN: 12 mg/dL (ref 7–25)
CO2: 24 mmol/L (ref 20–32)
Calcium: 9.3 mg/dL (ref 8.6–10.4)
Chloride: 110 mmol/L (ref 98–110)
Creat: 1.24 mg/dL — ABNORMAL HIGH (ref 0.50–1.05)
GFR, Est African American: 55 mL/min/{1.73_m2} — ABNORMAL LOW (ref >=60)
GFR, Est Non African American: 48 mL/min/{1.73_m2} — ABNORMAL LOW (ref >=60)
Globulin: 2 g/dL (calc) (ref 1.9–3.7)
Glucose, Bld: 95 mg/dL (ref 65–99)
Potassium: 3.9 mmol/L (ref 3.5–5.3)
Sodium: 141 mmol/L (ref 135–146)
Total Bilirubin: 0.5 mg/dL (ref 0.2–1.2)
Total Protein: 6.7 g/dL (ref 6.1–8.1)

## 2017-10-07 LAB — CBC WITH DIFFERENTIAL/PLATELET
Basophils Absolute: 50 cells/uL (ref 0–200)
Basophils Relative: 1.4 %
Eosinophils Absolute: 158 cells/uL (ref 15–500)
Eosinophils Relative: 4.4 %
HCT: 42.3 % (ref 35.0–45.0)
Hemoglobin: 14.7 g/dL (ref 11.7–15.5)
Lymphs Abs: 1422 cells/uL (ref 850–3900)
MCH: 32 pg (ref 27.0–33.0)
MCHC: 34.8 g/dL (ref 32.0–36.0)
MCV: 92.2 fL (ref 80.0–100.0)
MPV: 11.4 fL (ref 7.5–12.5)
Monocytes Relative: 7.7 %
Neutro Abs: 1692 cells/uL (ref 1500–7800)
Neutrophils Relative %: 47 %
Platelets: 192 10*3/uL (ref 140–400)
RBC: 4.59 10*6/uL (ref 3.80–5.10)
RDW: 12.8 % (ref 11.0–15.0)
Total Lymphocyte: 39.5 %
WBC mixed population: 277 cells/uL (ref 200–950)
WBC: 3.6 10*3/uL — ABNORMAL LOW (ref 3.8–10.8)

## 2017-10-07 LAB — LIPID PANEL
Cholesterol: 146 mg/dL (ref <200)
HDL: 58 mg/dL (ref >50)
LDL Cholesterol (Calc): 68 mg/dL (calc)
Non-HDL Cholesterol (Calc): 88 mg/dL (calc) (ref <130)
Total CHOL/HDL Ratio: 2.5 (calc) (ref <5.0)
Triglycerides: 121 mg/dL (ref <150)

## 2017-10-07 LAB — TSH: TSH: 3.83 mIU/L (ref 0.40–4.50)

## 2017-10-07 LAB — MAGNESIUM: Magnesium: 2.2 mg/dL (ref 1.5–2.5)

## 2017-10-07 NOTE — Progress Notes (Signed)
FOLLOW UP  Assessment and Plan:   BP Well controlled  Monitor blood pressure at home; patient to call if consistently greater than 130/80 Continue DASH diet.   Reminder to go to the ER if any CP, SOB, nausea, dizziness, severe HA, changes vision/speech, left arm numbness and tingling and jaw pain.  Cholesterol Currently above goal; has been taking crestor and fenofibrate more consistently Continue low cholesterol diet and exercise.  Check lipid panel.   Other abnormal glucose Recent A1Cs at goal Discussed diet/exercise, weight management  Defer A1C; check CMP/GFR  Hypothyroidism continue medications the same pending lab results; she did forget to take her medication this AM reminded to take on an empty stomach 30-49mins before food.  check TSH level  BMi 24 Continue to recommend diet heavy in fruits and veggies and low in animal meats, cheeses, and dairy products, appropriate calorie intake Discuss exercise recommendations routinely Continue to monitor weight at each visit  Vitamin D Def At goal at last visit; continue supplementation to maintain goal of 70-100 Defer Vit D level  ADD Continue medications Helps with focus, no AE's. The patient was counseled on the addictive nature of the medication and was encouraged to take drug holidays when not needed.   Anxiety Well managed by current regimen; continue medications; using benzo very rarely Stress management techniques discussed, increase water, good sleep hygiene discussed, increase exercise, and increase veggies.    Continue diet and meds as discussed. Further disposition pending results of labs. Discussed med's effects and SE's.   Over 30 minutes of exam, counseling, chart review, and critical decision making was performed.   Future Appointments  Date Time Provider Department Center  01/09/2018 11:00 AM Lucky Cowboy, MD [facility] None  07/27/2018  3:00 PM Lucky Cowboy, MD [facility] None     ----------------------------------------------------------------------------------------------------------------------  HPI 57 y.o. female  presents for 3 month follow up on hypertension, cholesterol, glucose management, hypothyroid, weight and vitamin D deficiency. 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 Patient is followed by Dr Terrace Arabia for Bo-Tox injections as well as for her chronic Migraines.   She is on pamelor and xanax for anxiety; she reports she is doing very well recently, has weaned herself off of regular xanax use and now uses very rarely and doing better.   BMI is Body mass index is 24.42 kg/m., she has been working on diet and exercise. Wt Readings from Last 3 Encounters:  10/07/17 163 lb (73.9 kg)  08/18/17 167 lb (75.8 kg)  08/08/17 164 lb (74.4 kg)   Her blood pressure has been controlled at home, today their BP is BP: 110/76  She does workout. She denies chest pain, shortness of breath, dizziness.   She is on cholesterol medication (she is on rosuvastatin 40 mg daily and fenofibrate daily) and denies myalgias. Her cholesterol is not at goal. The cholesterol last visit was:   Lab Results  Component Value Date   CHOL 235 (H) 06/20/2017   HDL 64 06/20/2017   LDLCALC 129 (H) 06/20/2017   TRIG 296 (H) 06/20/2017   CHOLHDL 3.7 06/20/2017    She has been working on diet and exercise for glucose management, and denies foot ulcerations, increased appetite, nausea, paresthesia of the feet, polydipsia, polyuria, visual disturbances, vomiting and weight loss. Last A1C in the office  was:  Lab Results  Component Value Date   HGBA1C 5.6 06/20/2017   She is on thyroid medication. Her medication was not changed last visit.   Lab Results   Component Value Date   TSH 0.76 06/20/2017   Patient is on Vitamin D supplement.   Lab Results  Component Value Date   VD25OH 69 06/20/2017        Current Medications:  Current Outpatient Medications on File Prior to Visit  Medication Sig  . albuterol (PROVENTIL HFA) 108 (90 Base) MCG/ACT inhaler 1 to 2 inhalations 10-15 minutes apart every 4 hours if needed for asthma rescue  . ALPRAZolam (XANAX) 1 MG tablet TAKE 1/2 TO 1 TABLET 2 TO 3 X / DAY AND PLEASE TRY TO LIMIT TO 5 DAYS /WEEK TO AVOID ADDICTION (Patient taking differently: Take one tablet as needed)  . amphetamine-dextroamphetamine (ADDERALL) 10 MG tablet Take 1/2 to 1 tablet 1 or 2 x daily for ADD  . aspirin EC 81 MG tablet Take 81 mg by mouth daily.  . Botulinum Toxin Type A (BOTOX) 200 UNITS SOLR Inject as directed. Change to BOTOX A 100  units  . buPROPion (WELLBUTRIN XL) 300 MG 24 hr tablet TAKE 1 TABLET (300 MG TOTAL) BY MOUTH DAILY. WITH FOOD  . butalbital-acetaminophen-caffeine (FIORICET, ESGIC) 50-325-40 MG tablet TAKE 1 TABLET BY MOUTH EVERY 6 HOURS AS NEEDED FOR HEADACHE  . CAMBIA 50 MG PACK TAKE 1 PACKET BY MOUTH AS NEEDED.  Marland Kitchen cetirizine (ZYRTEC) 10 MG tablet Take 10 mg by mouth daily.  . Diclofenac Potassium (CAMBIA) 50 MG PACK Take one packet at onset of migraine.  May repeat once in two hours, if needed.  Max dose 2 packets per day.  #27/90days  . Diclofenac Sodium (PENNSAID) 2 % SOLN Place 1 Squirt onto the skin 2 (two) times daily.  . fenofibrate micronized (LOFIBRA) 134 MG capsule TAKE 1 CAPSULE EVERY DAY  . fluticasone furoate-vilanterol (BREO ELLIPTA) 100-25 MCG/INH AEPB 1 Inhalation daily to prevent Asthma  . levothyroxine (SYNTHROID, LEVOTHROID) 100 MCG tablet TAKE 1 TABLET EVERY DAY  . magnesium oxide (MAG-OX) 400 MG tablet Take 400 mg by mouth 2 (two) times daily.   . montelukast (SINGULAIR) 10 MG tablet TAKE 1 TABLET BY MOUTH AT BEDTIME  . Multiple Vitamin (MULTIVITAMIN WITH MINERALS) TABS tablet Take  1 tablet by mouth daily.  . nortriptyline (PAMELOR) 25 MG capsule Take 2 capsules (50 mg total) by mouth at bedtime.  Marland Kitchen omeprazole (PRILOSEC) 40 MG capsule TAKE 1 CAPSULE (40 MG TOTAL) BY MOUTH DAILY. FOR ACID INDIGESTION AND REFLUX  . predniSONE (DELTASONE) 20 MG tablet TAKE 1 TAB 3 X DAY FOR 2 DAYS, THEN 1 TAB 2 X DAY FOR 2 DAYS, THEN 1 TAB 1 X DAY FOR 3 DAYS  . prochlorperazine (COMPAZINE) 5 MG tablet TAKE 1 TABLET BY MOUTH 3 TIMES A DAY AS NEEDED FOR NAUSEA  . Riboflavin 100 MG TABS Take 100 mg by mouth. bid  . rosuvastatin (CRESTOR) 40 MG tablet   . tiZANidine (ZANAFLEX) 2 MG tablet TAKE 1 TABLET (2 MG TOTAL) BY MOUTH EVERY 6 (SIX) HOURS AS NEEDED FOR MUSCLE SPASMS.  Marland Kitchen topiramate (TOPAMAX) 100 MG tablet Take 1 tablet (100 mg total) by mouth 2 (two) times daily.  . traMADol (ULTRAM) 50 MG tablet Take 1 tablet (50 mg total) by mouth every 6 (six) hours as needed.  . vitamin B-12 (CYANOCOBALAMIN) 1000 MCG tablet Take 1,000 mcg by mouth daily.  . Vitamin D, Ergocalciferol, (  DRISDOL) 50000 units CAPS capsule TAKE 1 CAPSULE DAILY   Current Facility-Administered Medications on File Prior to Visit  Medication  . promethazine (PHENERGAN) tablet 25 mg     Allergies:  Allergies  Allergen Reactions  . Penicillins Anaphylaxis and Hives  . Imitrex [Sumatriptan]   . Other Swelling    kiwi  . Sulfa Antibiotics Hives  . Zomig [Zolmitriptan]      Medical History:  Past Medical History:  Diagnosis Date  . Anemia   . Anxiety   . Asthma   . Chronic heartburn   . Depression   . Elevated cholesterol    on Crestor  . Headache(784.0)   . Hypertension   . Hypothyroidism    S/P thyroidectomy  . Migraine   . Occlusion and stenosis of carotid artery with cerebral infarction   . Spasm of muscle   . Unspecified cerebral artery occlusion with cerebral infarction   . Vitamin D deficiency    Family history- Reviewed and unchanged Social history- Reviewed and unchanged   Review of Systems:   Review of Systems  Constitutional: Negative for malaise/fatigue and weight loss.  HENT: Negative for hearing loss and tinnitus.   Eyes: Negative for blurred vision and double vision.  Respiratory: Negative for cough, shortness of breath and wheezing.   Cardiovascular: Negative for chest pain, palpitations, orthopnea, claudication and leg swelling.  Gastrointestinal: Negative for abdominal pain, blood in stool, constipation, diarrhea, heartburn, melena, nausea and vomiting.  Genitourinary: Negative.   Musculoskeletal: Negative for joint pain and myalgias.  Skin: Negative for rash.  Neurological: Positive for headaches (migraines). Negative for dizziness, tingling, sensory change and weakness.  Endo/Heme/Allergies: Negative for polydipsia.  Psychiatric/Behavioral: Negative.   All other systems reviewed and are negative.   Physical Exam: BP 110/76   Pulse 98   Temp (!) 97.2 F (36.2 C)   Ht 5' 8.5" (1.74 m)   Wt 163 lb (73.9 kg)   SpO2 96%   BMI 24.42 kg/m  Wt Readings from Last 3 Encounters:  10/07/17 163 lb (73.9 kg)  08/18/17 167 lb (75.8 kg)  08/08/17 164 lb (74.4 kg)   General Appearance: Well nourished, in no apparent distress. Eyes: PERRLA, EOMs, conjunctiva no swelling or erythema Sinuses: No Frontal/maxillary tenderness ENT/Mouth: Ext aud canals clear, TMs without erythema, bulging. No erythema, swelling, or exudate on post pharynx.  Tonsils not swollen or erythematous. Hearing normal.  Neck: Supple, thyroid normal.  Respiratory: Respiratory effort normal, BS equal bilaterally without rales, rhonchi, wheezing or stridor.  Cardio: RRR with no MRGs. Brisk peripheral pulses without edema.  Abdomen: Soft, + BS.  Non tender, no guarding, rebound, hernias, masses. Lymphatics: Non tender without lymphadenopathy.  Musculoskeletal: Full ROM, 5/5 strength, Normal gait Skin: Warm, dry without rashes, lesions, ecchymosis.  Neuro: Cranial nerves intact. No cerebellar symptoms.   Psych: Awake and oriented X 3, normal affect, Insight and Judgment appropriate.    Dan MakerAshley C Sarahann Horrell, NP 11:20 AM [facility]

## 2017-10-07 NOTE — Patient Instructions (Signed)
Aim for 7+ servings of fruits and vegetables daily  80+ fluid ounces of water or unsweet tea for healthy kidneys  Limit to max 1 drink of alcohol per day, avoid smoking  Limit animal fats in diet for cholesterol and heart health - choose grass fed whenever available  Aim for low stress - take time to unwind and care for your mental health  Aim for 150 min of moderate intensity exercise weekly for heart health, and weights twice weekly for bone health  Aim for 7-9 hours of sleep daily      When it comes to diets, agreement about the perfect plan isn't easy to find, even among the experts. Experts at the [employer] developed an idea known as the Healthy Eating Plate. Just imagine a plate divided into logical, healthy portions.  The emphasis is on diet quality:  Load up on vegetables and fruits - one-half of your plate: Aim for color and variety, and remember that potatoes don't count.  Go for whole grains - one-quarter of your plate: Whole wheat, barley, wheat berries, quinoa, oats, brown rice, and foods made with them. If you want pasta, go with whole wheat pasta.  Protein power - one-quarter of your plate: Fish, chicken, beans, and nuts are all healthy, versatile protein sources. Limit red meat.  The diet, however, does go beyond the plate, offering a few other suggestions.  Use healthy plant oils, such as olive, canola, soy, corn, sunflower and peanut. Check the labels, and avoid partially hydrogenated oil, which have unhealthy trans fats.  If you're thirsty, drink water. Coffee and tea are good in moderation, but skip sugary drinks and limit milk and dairy products to one or two daily servings.  The type of carbohydrate in the diet is more important than the amount. Some sources of carbohydrates, such as vegetables, fruits, whole grains, and beans-are healthier than others.  Finally, stay active.   

## 2017-10-09 ENCOUNTER — Encounter: Payer: Self-pay | Admitting: Adult Health

## 2017-10-09 DIAGNOSIS — K760 Fatty (change of) liver, not elsewhere classified: Secondary | ICD-10-CM | POA: Insufficient documentation

## 2017-10-24 ENCOUNTER — Other Ambulatory Visit: Payer: BLUE CROSS/BLUE SHIELD

## 2017-10-24 DIAGNOSIS — E782 Mixed hyperlipidemia: Secondary | ICD-10-CM | POA: Diagnosis not present

## 2017-10-24 DIAGNOSIS — Z79899 Other long term (current) drug therapy: Secondary | ICD-10-CM

## 2017-10-24 LAB — COMPLETE METABOLIC PANEL WITH GFR
AG Ratio: 2.4 (calc) (ref 1.0–2.5)
ALT: 33 U/L — ABNORMAL HIGH (ref 6–29)
AST: 21 U/L (ref 10–35)
Albumin: 4.7 g/dL (ref 3.6–5.1)
Alkaline phosphatase (APISO): 70 U/L (ref 33–130)
BUN/Creatinine Ratio: 21 (calc) (ref 6–22)
BUN: 23 mg/dL (ref 7–25)
CO2: 24 mmol/L (ref 20–32)
Calcium: 9.4 mg/dL (ref 8.6–10.4)
Chloride: 111 mmol/L — ABNORMAL HIGH (ref 98–110)
Creat: 1.08 mg/dL — ABNORMAL HIGH (ref 0.50–1.05)
GFR, Est African American: 66 mL/min/{1.73_m2} (ref >=60)
GFR, Est Non African American: 57 mL/min/{1.73_m2} — ABNORMAL LOW (ref >=60)
Globulin: 2 g/dL (calc) (ref 1.9–3.7)
Glucose, Bld: 73 mg/dL (ref 65–99)
Potassium: 3.5 mmol/L (ref 3.5–5.3)
Sodium: 142 mmol/L (ref 135–146)
Total Bilirubin: 0.4 mg/dL (ref 0.2–1.2)
Total Protein: 6.7 g/dL (ref 6.1–8.1)

## 2017-10-25 ENCOUNTER — Other Ambulatory Visit: Payer: Self-pay | Admitting: Adult Health

## 2017-10-25 DIAGNOSIS — N289 Disorder of kidney and ureter, unspecified: Secondary | ICD-10-CM

## 2017-10-27 ENCOUNTER — Other Ambulatory Visit: Payer: Self-pay

## 2017-10-27 DIAGNOSIS — F988 Other specified behavioral and emotional disorders with onset usually occurring in childhood and adolescence: Secondary | ICD-10-CM

## 2017-10-27 MED ORDER — AMPHETAMINE-DEXTROAMPHETAMINE 10 MG PO TABS
ORAL_TABLET | ORAL | 0 refills | Status: DC
Start: 2017-10-27 — End: 2018-01-03

## 2017-10-27 NOTE — Telephone Encounter (Signed)
Adderall refill request, PMP checked, last filled on 08/22/17, #60, 30 day supply.

## 2017-11-09 ENCOUNTER — Encounter: Payer: Self-pay | Admitting: Adult Health

## 2017-11-09 ENCOUNTER — Ambulatory Visit (INDEPENDENT_AMBULATORY_CARE_PROVIDER_SITE_OTHER): Payer: BLUE CROSS/BLUE SHIELD | Admitting: Adult Health

## 2017-11-09 VITALS — BP 110/74 | HR 95 | Temp 97.5°F | Ht 68.5 in | Wt 165.0 lb

## 2017-11-09 DIAGNOSIS — G43019 Migraine without aura, intractable, without status migrainosus: Secondary | ICD-10-CM | POA: Diagnosis not present

## 2017-11-09 MED ORDER — PREDNISONE 20 MG PO TABS: ORAL_TABLET | ORAL | 0 refills | Status: DC

## 2017-11-09 MED ORDER — KETOROLAC TROMETHAMINE 60 MG/2ML IM SOLN
60.0000 mg | Freq: Once | INTRAMUSCULAR | Status: AC
  Administered 2017-11-09: 60 mg via INTRAMUSCULAR

## 2017-11-09 MED ORDER — PROMETHAZINE HCL 25 MG PO TABS: 25.0000 mg | ORAL_TABLET | Freq: Once | ORAL | Status: DC

## 2017-11-09 NOTE — Patient Instructions (Signed)
Migraine Headache A migraine headache is an intense, throbbing pain on one side or both sides of the head. Migraines may also cause other symptoms, such as nausea, vomiting, and sensitivity to light and noise. What are the causes? Doing or taking certain things may also trigger migraines, such as:  Alcohol.  Smoking.  Medicines, such as: ? Medicine used to treat chest pain (nitroglycerine). ? Birth control pills. ? Estrogen pills. ? Certain blood pressure medicines.  Aged cheeses, chocolate, or caffeine.  Foods or drinks that contain nitrates, glutamate, aspartame, or tyramine.  Physical activity.  Other things that may trigger a migraine include:  Menstruation.  Pregnancy.  Hunger.  Stress, lack of sleep, too much sleep, or fatigue.  Weather changes.  What increases the risk? The following factors may make you more likely to experience migraine headaches:  Age. Risk increases with age.  Family history of migraine headaches.  Being Caucasian.  Depression and anxiety.  Obesity.  Being a woman.  Having a hole in the heart (patent foramen ovale) or other heart problems.  What are the signs or symptoms? The main symptom of this condition is pulsating or throbbing pain. Pain may:  Happen in any area of the head, such as on one side or both sides.  Interfere with daily activities.  Get worse with physical activity.  Get worse with exposure to bright lights or loud noises.  Other symptoms may include:  Nausea.  Vomiting.  Dizziness.  General sensitivity to bright lights, loud noises, or smells.  Before you get a migraine, you may get warning signs that a migraine is developing (aura). An aura may include:  Seeing flashing lights or having blind spots.  Seeing bright spots, halos, or zigzag lines.  Having tunnel vision or blurred vision.  Having numbness or a tingling feeling.  Having trouble talking.  Having muscle weakness.  How is this  diagnosed? A migraine headache can be diagnosed based on:  Your symptoms.  A physical exam.  Tests, such as CT scan or MRI of the head. These imaging tests can help rule out other causes of headaches.  Taking fluid from the spine (lumbar puncture) and analyzing it (cerebrospinal fluid analysis, or CSF analysis).  How is this treated? A migraine headache is usually treated with medicines that:  Relieve pain.  Relieve nausea.  Prevent migraines from coming back.  Treatment may also include:  Acupuncture.  Lifestyle changes like avoiding foods that trigger migraines.  Follow these instructions at home: Medicines  Take over-the-counter and prescription medicines only as told by your health care provider.  Do not drive or use heavy machinery while taking prescription pain medicine.  To prevent or treat constipation while you are taking prescription pain medicine, your health care provider may recommend that you: ? Drink enough fluid to keep your urine clear or pale yellow. ? Take over-the-counter or prescription medicines. ? Eat foods that are high in fiber, such as fresh fruits and vegetables, whole grains, and beans. ? Limit foods that are high in fat and processed sugars, such as fried and sweet foods. Lifestyle  Avoid alcohol use.  Do not use any products that contain nicotine or tobacco, such as cigarettes and e-cigarettes. If you need help quitting, ask your health care provider.  Get at least 8 hours of sleep every night.  Limit your stress. General instructions   Keep a journal to find out what may trigger your migraine headaches. For example, write down: ? What you eat and   drink. ? How much sleep you get. ? Any change to your diet or medicines.  If you have a migraine: ? Avoid things that make your symptoms worse, such as bright lights. ? It may help to lie down in a dark, quiet room. ? Do not drive or use heavy machinery. ? Ask your health care provider  what activities are safe for you while you are experiencing symptoms.  Keep all follow-up visits as told by your health care provider. This is important. Contact a health care provider if:  You develop symptoms that are different or more severe than your usual migraine symptoms. Get help right away if:  Your migraine becomes severe.  You have a fever.  You have a stiff neck.  You have vision loss.  Your muscles feel weak or like you cannot control them.  You start to lose your balance often.  You develop trouble walking.  You faint. This information is not intended to replace advice given to you by your health care provider. Make sure you discuss any questions you have with your health care provider. Document Released: 03/15/2005 Document Revised: 10/03/2015 Document Reviewed: 09/01/2015 Elsevier Interactive Patient Education  2017 Elsevier Inc.    Promethazine injection What is this medicine? PROMETHAZINE (proe METH a zeen) is an antihistamine. It is used to treat allergic reactions and to treat or prevent nausea and vomiting from illness or motion sickness. It is also used to make you sleep before surgery, and to help treat pain or nausea after surgery. This medicine may be used for other purposes; ask your health care provider or pharmacist if you have questions. COMMON BRAND NAME(S): Anergan-50, Pentazine, Phenergan What should I tell my health care provider before I take this medicine? They need to know if you have any of these conditions: -glaucoma -high blood pressure or heart disease -kidney disease -liver disease -lung or breathing disease, like asthma -prostate trouble -pain or difficulty passing urine -seizures -an unusual or allergic reaction to promethazine or phenothiazines, other medicines, foods, dyes, or preservatives -pregnant or trying to get pregnant -breast-feeding How should I use this medicine? This medicine is for injection into a muscle, or  into a vein. It is given by a health care professional in a hospital or clinic setting. Talk to your pediatrician regarding the use of this medicine in children. This medicine should not be given to infants and children younger than 67 years old. Overdosage: If you think you have taken too much of this medicine contact a poison control center or emergency room at once. NOTE: This medicine is only for you. Do not share this medicine with others. What if I miss a dose? This does not apply. What may interact with this medicine? Do not take this medicine with any of the following medications: -cisapride -dofetilide -dronedarone -MAOIs like Carbex, Eldepryl, Marplan, Nardil, Parnate -pimozide -quinidine, including dextromethorphan; quinidine -thioridazine -ziprasidone This medicine may also interact with the following medications: -certain medicines for depression, anxiety, or psychotic disturbances -certain medicines for anxiety or sleep -certain medicines for seizures like carbamazepine, phenobarbital, phenytoin -certain medicines for movement abnormalities as in Parkinson's disease, or for gastrointestinal problems -epinephrine -medicines for allergies or colds -muscle relaxants -narcotic medicines for pain -other medicines that prolong the QT interval (cause an abnormal heart rhythm) -tramadol -trimethobenzamide This list may not describe all possible interactions. Give your health care provider a list of all the medicines, herbs, non-prescription drugs, or dietary supplements you use. Also tell them if you  smoke, drink alcohol, or use illegal drugs. Some items may interact with your medicine. What should I watch for while using this medicine? Your condition will be monitored carefully while you are receiving this medicine. Your healthcare professional will discuss with you the risks and the benefits of using this medicine. This medicine has caused serious side effects in some patients  after it was injected into a vein. Watch closely for any signs or symptoms of a local reaction like burning, pain, redness, swelling, and blistering and tell your healthcare professional immediately if any occur. These symptoms may occur when you receive the injection or may occur hours or even days after the injection. You may get drowsy or dizzy. Do not drive, use machinery, or do anything that needs mental alertness until you know how this medicine affects you. To reduce the risk of dizzy or fainting spells, do not stand or sit up quickly, especially if you are an older patient. Alcohol may increase dizziness and drowsiness. Avoid alcoholic drinks. Your mouth may get dry. Chewing sugarless gum or sucking hard candy, and drinking plenty of water will help. This medicine may cause dry eyes and blurred vision. If you wear contact lenses you may feel some discomfort. Lubricating drops may help. See your eye doctor if the problem does not go away or is severe. Keep out of the sun, or wear protective clothing outdoors and use a sunscreen. Do not use sun lamps or sun tanning beds or booths. If you are diabetic, check your blood-sugar levels regularly. What side effects may I notice from receiving this medicine? Side effects that you should report to your doctor or health care professional as soon as possible: -allergic reactions like skin rash, itching or hives, swelling of the face, lips, or tongue -blurred vision -burning, blistering, pain, redness, and/or swelling at the injection site -irregular heartbeat, palpitations or chest pain -muscle or facial twitches -pain or difficulty passing urine -seizures -slowed or shallow breathing -unusual bleeding or bruising -yellowing of the eyes or skin Side effects that usually do not require medical attention (report to your doctor or health care professional if they continue or are bothersome): -headache -nightmares, agitation, nervousness, excitability,  not able to sleep (these are more likely in children) -stuffy nose This list may not describe all possible side effects. Call your doctor for medical advice about side effects. You may report side effects to FDA at 1-800-FDA-1088. Where should I keep my medicine? This drug is given in a hospital or clinic and will not be stored at home. NOTE: This sheet is a summary. It may not cover all possible information. If you have questions about this medicine, talk to your doctor, pharmacist, or health care provider.  2018 Elsevier/Gold Standard (2012-11-15 16:03:14)  Ketorolac injection What is this medicine? KETOROLAC (kee toe ROLE ak) is a non-steroidal anti-inflammatory drug (NSAID). It is used to treat moderate to severe pain for up to 5 days. It is commonly used after surgery. This medicine should not be used for more than 5 days. This medicine may be used for other purposes; ask your health care provider or pharmacist if you have questions. COMMON BRAND NAME(S): Toradol What should I tell my health care provider before I take this medicine? They need to know if you have any of these conditions: -asthma, especially aspirin-sensitive asthma -bleeding problems -kidney disease -stomach bleed, ulcer, or other problem -taking aspirin, other NSAID, or probenecid -an unusual or allergic reaction to ketorolac, tromethamine, aspirin, other NSAIDs, other  medicines, foods, dyes or preservatives -pregnant or trying to get pregnant -breast-feeding How should I use this medicine? This medicine is for injection into a muscle or into a vein. It is given by a health care professional in a hospital or clinic setting. Talk to your pediatrician regarding the use of this medicine in children. Special care may be needed. Patients over 59 years old may have a stronger reaction and need a smaller dose. Overdosage: If you think you have taken too much of this medicine contact a poison control center or emergency  room at once. NOTE: This medicine is only for you. Do not share this medicine with others. What if I miss a dose? This does not apply. What may interact with this medicine? Do not take this medicine with any of the following medications: -aspirin and aspirin-like medicines -cidofovir -methotrexate -NSAIDs, medicines for pain and inflammation, like ibuprofen or naproxen -pentoxifylline -probenecid This medicine may also interact with the following medications: -alcohol -alendronate -alprazolam -carbamazepine -diuretics -flavocoxid -fluoxetine -ginkgo -lithium -medicines for blood pressure like enalapril -medicines that affect platelets like pentoxifylline -medicines that treat or prevent blood clots like heparin, warfarin -muscle relaxants -pemetrexed -phenytoin -thiothixene This list may not describe all possible interactions. Give your health care provider a list of all the medicines, herbs, non-prescription drugs, or dietary supplements you use. Also tell them if you smoke, drink alcohol, or use illegal drugs. Some items may interact with your medicine. What should I watch for while using this medicine? Tell your doctor or healthcare professional if your symptoms do not start to get better or if they get worse. This medicine does not prevent heart attack or stroke. In fact, this medicine may increase the chance of a heart attack or stroke. The chance may increase with longer use of this medicine and in people who have heart disease. If you take aspirin to prevent heart attack or stroke, talk with your doctor or health care professional. Do not take medicines such as ibuprofen and naproxen with this medicine. Side effects such as stomach upset, nausea, or ulcers may be more likely to occur. Many medicines available without a prescription should not be taken with this medicine. This medicine can cause ulcers and bleeding in the stomach and intestines at any time during treatment. Do  not smoke cigarettes or drink alcohol. These increase irritation to your stomach and can make it more susceptible to damage from this medicine. Ulcers and bleeding can happen without warning symptoms and can cause death. This medicine can cause you to bleed more easily. Try to avoid damage to your teeth and gums when you brush or floss your teeth. What side effects may I notice from receiving this medicine? Side effects that you should report to your doctor or health care professional as soon as possible: -allergic reactions like skin rash, itching or hives, swelling of the face, lips, or tongue -breathing problems -high blood pressure -nausea, vomiting -redness, blistering, peeling or loosening of the skin, including inside the mouth -severe stomach pain -signs and symptoms of bleeding such as bloody or black, tarry stools; red or dark-brown urine; spitting up blood or brown material that looks like coffee grounds; red spots on the skin; unusual bruising or bleeding from the eye, gums, or nose -signs and symptoms of a blood clot changes in vision; chest pain; severe, sudden headache; trouble speaking; sudden numbness or weakness of the face, arm, or leg -trouble passing urine or change in the amount of urine -unexplained weight  gain or swelling -unusually weak or tired -yellowing of eyes or skin Side effects that usually do not require medical attention (report to your doctor or health care professional if they continue or are bothersome): -diarrhea -dizziness -headache -heartburn This list may not describe all possible side effects. Call your doctor for medical advice about side effects. You may report side effects to FDA at 1-800-FDA-1088. Where should I keep my medicine? This drug is given in a hospital or clinic and will not be stored at home. NOTE: This sheet is a summary. It may not cover all possible information. If you have questions about this medicine, talk to your doctor,  pharmacist, or health care provider.  2018 Elsevier/Gold Standard (2016-03-24 14:38:40)

## 2017-11-09 NOTE — Progress Notes (Signed)
Assessment and Plan:  Selena BattenKim was seen today for headache.  Diagnoses and all orders for this visit:  Intractable migraine without aura and without status migrainosus Will give migraine cocktail which she reports has been beneficial previously; prednisone refilled. Emphasized needs to go to ER or follow up with neurology if not improving by tomorrow. She is driven home today by her husband.  -     ketorolac (TORADOL) injection 60 mg -     Discontinue: promethazine (PHENERGAN) tablet 25 mg -     predniSONE (DELTASONE) 20 MG tablet; Take 2 tabs for migraine PRN once daily.  Further disposition pending results of labs. Discussed med's effects and SE's.   Over 15 minutes of exam, counseling, chart review, and critical decision making was performed.   Future Appointments  Date Time Provider Department Center  01/09/2018 11:00 AM Lucky CowboyMcKeown, William, MD [facility] None  07/27/2018  3:00 PM Lucky CowboyMcKeown, William, MD [facility] None    ------------------------------------------------------------------------------------------------------------------   HPI BP 110/74   Pulse 95   Temp (!) 97.5 F (36.4 C)   Ht 5' 8.5" (1.74 m)   Wt 165 lb (74.8 kg)   SpO2 91%   BMI 24.72 kg/m   57 y.o.female with hx of migraines presents for evaluation of headache ongoing for 3 days; she reports she visited her MIL 3 days ago where the TV was on loud, started to get a dull/mild pain with eye pain typical for her migraines, took Cambia diclofenac pack (prescribed by neurology) which helped dull pain, now just having dull pain "top of my head" "feels like my brains are scrambling." She typically can do a short prednisone taper which resolves pain but only had 1 tab of prednisone left. She reports her pain is currently 6/10, having some nausea.   She has tried sublingual hemp and reports this has helped some. She has also took a muscle relaxer and has been applying heat to neck which helps her sleep.   Her husband will  be coming to pick her up.   Past Medical History:  Diagnosis Date  . Anemia   . Anxiety   . Asthma   . Chronic heartburn   . Depression   . Elevated cholesterol    on Crestor  . Headache(784.0)   . Hypertension   . Hypothyroidism    S/P thyroidectomy  . Migraine   . Occlusion and stenosis of carotid artery with cerebral infarction   . Spasm of muscle   . Unspecified cerebral artery occlusion with cerebral infarction   . Vitamin D deficiency      Allergies  Allergen Reactions  . Penicillins Anaphylaxis and Hives  . Imitrex [Sumatriptan]   . Other Swelling    kiwi  . Sulfa Antibiotics Hives  . Zomig [Zolmitriptan]     Current Outpatient Medications on File Prior to Visit  Medication Sig  . albuterol (PROVENTIL HFA) 108 (90 Base) MCG/ACT inhaler 1 to 2 inhalations 10-15 minutes apart every 4 hours if needed for asthma rescue  . ALPRAZolam (XANAX) 1 MG tablet TAKE 1/2 TO 1 TABLET 2 TO 3 X / DAY AND PLEASE TRY TO LIMIT TO 5 DAYS /WEEK TO AVOID ADDICTION (Patient taking differently: Take one tablet as needed)  . amphetamine-dextroamphetamine (ADDERALL) 10 MG tablet Take 1/2 to 1 tablet 1 or 2 x daily for ADD  . aspirin EC 81 MG tablet Take 81 mg by mouth daily.  . Botulinum Toxin Type A (BOTOX) 200 UNITS SOLR Inject as directed. Change  to BOTOX A 100  units  . buPROPion (WELLBUTRIN XL) 300 MG 24 hr tablet TAKE 1 TABLET (300 MG TOTAL) BY MOUTH DAILY. WITH FOOD  . butalbital-acetaminophen-caffeine (FIORICET, ESGIC) 50-325-40 MG tablet TAKE 1 TABLET BY MOUTH EVERY 6 HOURS AS NEEDED FOR HEADACHE  . CAMBIA 50 MG PACK TAKE 1 PACKET BY MOUTH AS NEEDED.  Marland Kitchen. cetirizine (ZYRTEC) 10 MG tablet Take 10 mg by mouth daily.  . Diclofenac Potassium (CAMBIA) 50 MG PACK Take one packet at onset of migraine.  May repeat once in two hours, if needed.  Max dose 2 packets per day.  #27/90days  . Diclofenac Sodium (PENNSAID) 2 % SOLN Place 1 Squirt onto the skin 2 (two) times daily.  . fenofibrate  micronized (LOFIBRA) 134 MG capsule TAKE 1 CAPSULE EVERY DAY  . fluticasone furoate-vilanterol (BREO ELLIPTA) 100-25 MCG/INH AEPB 1 Inhalation daily to prevent Asthma  . levothyroxine (SYNTHROID, LEVOTHROID) 100 MCG tablet TAKE 1 TABLET EVERY DAY  . magnesium oxide (MAG-OX) 400 MG tablet Take 400 mg by mouth 2 (two) times daily.   . montelukast (SINGULAIR) 10 MG tablet TAKE 1 TABLET BY MOUTH AT BEDTIME  . Multiple Vitamin (MULTIVITAMIN WITH MINERALS) TABS tablet Take 1 tablet by mouth daily.  . nortriptyline (PAMELOR) 25 MG capsule Take 2 capsules (50 mg total) by mouth at bedtime.  Marland Kitchen. omeprazole (PRILOSEC) 40 MG capsule TAKE 1 CAPSULE (40 MG TOTAL) BY MOUTH DAILY. FOR ACID INDIGESTION AND REFLUX  . prochlorperazine (COMPAZINE) 5 MG tablet TAKE 1 TABLET BY MOUTH 3 TIMES A DAY AS NEEDED FOR NAUSEA  . Riboflavin 100 MG TABS Take 100 mg by mouth. bid  . rosuvastatin (CRESTOR) 40 MG tablet   . tiZANidine (ZANAFLEX) 2 MG tablet TAKE 1 TABLET (2 MG TOTAL) BY MOUTH EVERY 6 (SIX) HOURS AS NEEDED FOR MUSCLE SPASMS.  Marland Kitchen. topiramate (TOPAMAX) 100 MG tablet Take 1 tablet (100 mg total) by mouth 2 (two) times daily.  . traMADol (ULTRAM) 50 MG tablet Take 1 tablet (50 mg total) by mouth every 6 (six) hours as needed.  . vitamin B-12 (CYANOCOBALAMIN) 1000 MCG tablet Take 1,000 mcg by mouth daily.  . Vitamin D, Ergocalciferol, (DRISDOL) 50000 units CAPS capsule TAKE 1 CAPSULE DAILY   No current facility-administered medications on file prior to visit.     ROS: all negative except above.   Physical Exam:  BP 110/74   Pulse 95   Temp (!) 97.5 F (36.4 C)   Ht 5' 8.5" (1.74 m)   Wt 165 lb (74.8 kg)   SpO2 91%   BMI 24.72 kg/m   General Appearance: Well nourished, in no apparent distress. Eyes: PERRLA, EOMs, conjunctiva no swelling or erythema Sinuses: No Frontal/maxillary tenderness ENT/Mouth: Ext aud canals clear, TMs without erythema, bulging. No erythema, swelling, or exudate on post pharynx.   Tonsils not swollen or erythematous. Hearing normal.  Neck: Supple, thyroid normal.  Respiratory: Respiratory effort normal, BS equal bilaterally without rales, rhonchi, wheezing or stridor.  Cardio: RRR with no MRGs. Brisk peripheral pulses without edema.  Abdomen: Soft, + BS.  Non tender, no guarding, rebound, hernias, masses. Lymphatics: Non tender without lymphadenopathy.  Musculoskeletal: Full ROM, 5/5 strength, normal gait.  Skin: Warm, dry without rashes, lesions, ecchymosis.  Neuro: Cranial nerves intact. Normal muscle tone, no cerebellar symptoms. Sensation intact.  Psych: Awake and oriented X 3, normal affect, Insight and Judgment appropriate.     Dan MakerAshley C Aberdeen Hafen, NP 12:09 PM [facility]

## 2017-11-21 ENCOUNTER — Encounter: Payer: Self-pay | Admitting: *Deleted

## 2017-11-21 ENCOUNTER — Telehealth: Payer: Self-pay | Admitting: Neurology

## 2017-11-21 NOTE — Telephone Encounter (Signed)
Pt scheduled botox for 10/23 but is wanting to be seen sooner. She is starting to have symptoms. Please call to advise if appt can be moved up

## 2017-11-21 NOTE — Telephone Encounter (Signed)
Noted, thank you

## 2017-11-21 NOTE — Telephone Encounter (Signed)
Spoke to patient - her Botox appt has been moved to 12/19/17 at 11:30am (she will arrive at 11:15am).  She is going to call [facility] at [phone] to provide authorization for the medication to be shipped to our office.

## 2017-11-22 ENCOUNTER — Other Ambulatory Visit: Payer: Self-pay | Admitting: Neurology

## 2017-12-02 NOTE — Telephone Encounter (Signed)
I called and requested refill with [facility] [phone].

## 2017-12-13 DIAGNOSIS — G43719 Chronic migraine without aura, intractable, without status migrainosus: Secondary | ICD-10-CM | POA: Diagnosis not present

## 2017-12-13 NOTE — Telephone Encounter (Signed)
I called to schedule delivery at [facility].

## 2017-12-19 ENCOUNTER — Ambulatory Visit: Payer: Self-pay | Admitting: Neurology

## 2017-12-19 ENCOUNTER — Encounter

## 2017-12-21 ENCOUNTER — Other Ambulatory Visit: Payer: Self-pay | Admitting: Adult Health

## 2017-12-22 ENCOUNTER — Ambulatory Visit (INDEPENDENT_AMBULATORY_CARE_PROVIDER_SITE_OTHER): Payer: BLUE CROSS/BLUE SHIELD | Admitting: Physician Assistant

## 2017-12-22 ENCOUNTER — Encounter: Payer: Self-pay | Admitting: Physician Assistant

## 2017-12-22 VITALS — BP 128/72 | HR 76 | Temp 98.6°F | Resp 16 | Ht 69.0 in | Wt 165.4 lb

## 2017-12-22 DIAGNOSIS — G43019 Migraine without aura, intractable, without status migrainosus: Secondary | ICD-10-CM | POA: Diagnosis not present

## 2017-12-22 MED ORDER — KETOROLAC TROMETHAMINE 60 MG/2ML IM SOLN
60.0000 mg | Freq: Once | INTRAMUSCULAR | Status: AC
  Administered 2017-12-22: 60 mg via INTRAMUSCULAR

## 2017-12-22 MED ORDER — PROMETHAZINE HCL 25 MG/ML IJ SOLN
25.0000 mg | Freq: Once | INTRAMUSCULAR | Status: AC
  Administered 2017-12-22: 25 mg via INTRAMUSCULAR

## 2017-12-22 MED ORDER — PREDNISONE 20 MG PO TABS: ORAL_TABLET | ORAL | 2 refills | Status: DC

## 2017-12-22 NOTE — Patient Instructions (Signed)
Being dehydrated can hurt your kidneys, cause fatigue, headaches, muscle aches, joint pain, and dry skin/nails so please increase your fluids.   Drink 80-100 oz a day of water, measure it out! Eat 3 meals a day, have to do breakfast, eat protein- hard boiled eggs, protein bar like nature valley protein bar, greek yogurt like oikos triple zero, chobani 100, or light n fit greek  Can check out plantnanny app on your phone to help you keep track of your water       To prevent migraines you can try these natural/OTC medications as prevention: 1) Melatonin 5mg -15mg  30 mins before bed 2) Riboflavin/B2 400mg  daily 3) CoQ10 100mg  3 x a day  We may also treat TMJ if we think you have it If you are having frequent migraines we may put you on a once a day medication with fast acting medication to take. Also there is such a thing called rebound headache from over use of acute medications.  Please do not use rescue or acute medications more than 10 days a month or more than 3 days per week, this can cause a withdrawal and a rebound headache.  Here is more information below  Please remember, common headache triggers are: sleep deprivation, dehydration, overheating, stress, hypoglycemia or skipping meals and blood sugar fluctuations, excessive pain medications or excessive alcohol use or caffeine withdrawal. Some people have food triggers such as aged cheese, orange juice or chocolate, especially dark chocolate, or MSG (monosodium glutamate). Try to avoid these headache triggers as much possible.   It may be helpful to keep a headache diary to figure out what makes your headaches worse or brings them on and what alleviates them. Some people report headache onset after exercise but studies have shown that regular exercise may actually prevent headaches from coming. If you have exercise-induced headaches, please make sure that you drink plenty of fluid before and after exercising and that you do not over do  it and do not overheat.   Please go to the ER if there is weakness, thunderclap headache, visual changes, or any concerning factors    Migraine Headache A migraine headache is an intense, throbbing pain on one or both sides of your head. Recurrent migraines keep coming back. A migraine can last for 30 minutes to several hours. CAUSES  The exact cause of a migraine headache is not always known. However, a migraine may be caused when nerves in the brain become irritated and release chemicals that cause inflammation. This causes pain. Certain things may also trigger migraines, such as:   Alcohol.  Smoking.  Stress.  Menstruation.  Aged cheeses.  Foods or drinks that contain nitrates, glutamate, aspartame, or tyramine.  Lack of sleep.  Chocolate.  Caffeine.  Hunger.  Physical exertion.  Fatigue.  Medicines used to treat chest pain (nitroglycerine), birth control pills, estrogen, and some blood pressure medicines. SYMPTOMS   Pain on one or both sides of your head.  Pulsating or throbbing pain.  Severe pain that prevents daily activities.  Pain that is aggravated by any physical activity.  Nausea, vomiting, or both.  Dizziness.  Pain with exposure to bright lights, loud noises, or activity.  General sensitivity to bright lights, loud noises, or smells. Before you get a migraine, you may get warning signs that a migraine is coming (aura). An aura may include:  Seeing flashing lights.  Seeing bright spots, halos, or zigzag lines.  Having tunnel vision or blurred vision.  Having feelings of numbness  or tingling.  Having trouble talking.  Having muscle weakness. DIAGNOSIS  A recurrent migraine headache is often diagnosed based on:  Symptoms.  Physical examination.  A CT scan or MRI of your head. These imaging tests cannot diagnose migraines but can help rule out other causes of headaches.  TREATMENT  Medicines may be given for pain and nausea.  Medicines can also be given to help prevent recurrent migraines. HOME CARE INSTRUCTIONS  Only take over-the-counter or prescription medicines for pain or discomfort as directed by your health care provider. The use of long-term narcotics is not recommended.  Lie down in a dark, quiet room when you have a migraine.  Keep a journal to find out what may trigger your migraine headaches. For example, write down:  What you eat and drink.  How much sleep you get.  Any change to your diet or medicines.  Limit alcohol consumption.  Quit smoking if you smoke.  Get 7-9 hours of sleep, or as recommended by your health care provider.  Limit stress.  Keep lights dim if bright lights bother you and make your migraines worse. SEEK MEDICAL CARE IF:   You do not get relief from the medicines given to you.  You have a recurrence of pain.  You have a fever. SEEK IMMEDIATE MEDICAL CARE IF:  Your migraine becomes severe.  You have a stiff neck.  You have loss of vision.  You have muscular weakness or loss of muscle control.  You start losing your balance or have trouble walking.  You feel faint or pass out. You have severe symptoms that are different from your first symptoms. MAKE SURE YOU:   Understand these instructions.  Will watch your condition.  Will get help right away if you are not doing well or get worse.   This information is not intended to replace advice given to you by your health care provider. Make sure you discuss any questions you have with your health care provider.   Document Released: 12/08/2000 Document Revised: 04/05/2014 Document Reviewed: 11/20/2012 Elsevier Interactive Patient Education 2016 [employer].  Common Migraine Triggers   Foods Aged cheese, alcohol, nuts, chocolate, yogurt, onions, figs, liver, caffeinated foods and beverages, monosodium glutamate (MSG), smoked or pickled fish/meat, nitrate/nitrate preserved foods (hotdogs, pepperoni,  salami) tyramine  Medications Antibiotics (tetracycline, griseofulvin), antihypertensives (nifedipine, captopril), hormones (oral contraceptives, estrogens), histamine-2 blockers (cimetidine, raniidine, vasodilators (nitroglycerine, isosorbide dinitrate)  Sensory Stimuli Flickering/bright/fluorescent lights, bright sunlight, odors (perfume, chemicals, cigarette smoke)  Lifestyle Changes Time zones, sleep patterns, eating habits, caffeine withdrawal stress  Other Menstrual cycle, weather/season/air pressure changes, high altitude  Adapted from [place], [place]. Clin. J. Med. 1995; [place] and [place]. Conquering Headache, 1998  Hormonal variations also are believed to play a part.  Fluctuations of the female hormone estrogen (such as just before menstruation) affect a chemical called serotonin-when serotonin levels in the brain fall, the dilation (expansion) of blood vessels in the brain that is characteristic of migraine often follows.  Many factors or "triggers" can start a migraine.  In people who get migraines, most experts think certain activities or foods may trigger temporary changes in the blood vessels around the brain.  Swelling of these blood vessels may cause pain in the nearby nerves.  Allergy Headaches:  Hotdogs Milk  Onions  Thyme Bacon  Chocolate Garlic  Nutmeg Ham  Dark Cola Pork  Cinnamon Salami  Nuts  Egg  Ginger Sausage Red wine Cloves  Cheddar Cheese Caffeine

## 2017-12-22 NOTE — Progress Notes (Signed)
Assessment and Plan:  Intractable migraine without aura and without status migrainosus Will give migraine cocktail which she reports has been beneficial previously; prednisone refilled. Emphasized needs to go to ER or follow up with neurology if not improving by tomorrow. She is driven home today by her husband.  -     ketorolac (TORADOL) injection 60 mg -     predniSONE (DELTASONE) 20 MG tablet; Take 2 tabs for migraine PRN once daily. - promethazine injection  Further disposition pending results of labs. Discussed med's effects and SE's.   Over 15 minutes of exam, counseling, chart review, and critical decision making was performed.   Future Appointments  Date Time Provider Department Center  12/26/2017 11:00 AM Levert Feinstein, MD [facility] None  01/09/2018 11:00 AM Lucky Cowboy, MD [facility] None  07/27/2018  3:00 PM Lucky Cowboy, MD [facility] None    ------------------------------------------------------------------------------------------------------------------   HPI BP 128/72   Pulse 76   Temp 98.6 F (37 C)   Resp 16   Ht 5\' 9"  (1.753 m)   Wt 165 lb 6.4 oz (75 kg)   SpO2 98%   BMI 24.43 kg/m   57 y.o.female with hx of migraines presents for evaluation of headache ongoing for 2 days, she is behind with her botox injections and has been having more frequent headaches. She took her cambia and it has not helped.  Now just having dull pain "top of my head" "feels like my brains are scrambling." She typically can do a short prednisone taper however she is out of prednisone.   Her husband will be coming to pick her up.   Lab Results  Component Value Date   CREATININE 1.08 (H) 10/24/2017   BUN 23 10/24/2017   NA 142 10/24/2017   K 3.5 10/24/2017   CL 111 (H) 10/24/2017   CO2 24 10/24/2017    Past Medical History:  Diagnosis Date  . Anemia   . Anxiety   . Asthma   . Chronic heartburn   . Depression   . Elevated cholesterol    on Crestor  . Headache(784.0)   .  Hypertension   . Hypothyroidism    S/P thyroidectomy  . Migraine   . Occlusion and stenosis of carotid artery with cerebral infarction   . Spasm of muscle   . Unspecified cerebral artery occlusion with cerebral infarction   . Vitamin D deficiency      Allergies  Allergen Reactions  . Penicillins Anaphylaxis and Hives  . Imitrex [Sumatriptan]   . Other Swelling    kiwi  . Sulfa Antibiotics Hives  . Zomig [Zolmitriptan]     Current Outpatient Medications on File Prior to Visit  Medication Sig  . albuterol (PROVENTIL HFA) 108 (90 Base) MCG/ACT inhaler 1 to 2 inhalations 10-15 minutes apart every 4 hours if needed for asthma rescue  . ALPRAZolam (XANAX) 1 MG tablet TAKE 1/2 TO 1 TABLET 2 TO 3 X / DAY AND PLEASE TRY TO LIMIT TO 5 DAYS /WEEK TO AVOID ADDICTION (Patient taking differently: Take one tablet as needed)  . aspirin EC 81 MG tablet Take 81 mg by mouth daily.  . Botulinum Toxin Type A (BOTOX) 200 UNITS SOLR Inject as directed. Change to BOTOX A 100  units  . buPROPion (WELLBUTRIN XL) 300 MG 24 hr tablet TAKE 1 TABLET (300 MG TOTAL) BY MOUTH DAILY. WITH FOOD  . butalbital-acetaminophen-caffeine (FIORICET, ESGIC) 50-325-40 MG tablet TAKE 1 TABLET BY MOUTH EVERY 6 HOURS AS NEEDED FOR HEADACHE  .  cetirizine (ZYRTEC) 10 MG tablet Take 10 mg by mouth daily.  . Diclofenac Potassium (CAMBIA) 50 MG PACK Take 1 packet at onset of migraine. Can repeat in 2 hr once if needed. No more than 2 doses in 24hr.  . fenofibrate micronized (LOFIBRA) 134 MG capsule TAKE 1 CAPSULE EVERY DAY  . fluticasone furoate-vilanterol (BREO ELLIPTA) 100-25 MCG/INH AEPB 1 Inhalation daily to prevent Asthma  . levothyroxine (SYNTHROID, LEVOTHROID) 100 MCG tablet TAKE 1 TABLET BY MOUTH EVERY DAY  . magnesium oxide (MAG-OX) 400 MG tablet Take 400 mg by mouth 2 (two) times daily.   . montelukast (SINGULAIR) 10 MG tablet TAKE 1 TABLET BY MOUTH AT BEDTIME  . Multiple Vitamin (MULTIVITAMIN WITH MINERALS) TABS tablet  Take 1 tablet by mouth daily.  . nortriptyline (PAMELOR) 25 MG capsule Take 2 capsules (50 mg total) by mouth at bedtime.  Marland Kitchen omeprazole (PRILOSEC) 40 MG capsule TAKE 1 CAPSULE (40 MG TOTAL) BY MOUTH DAILY. FOR ACID INDIGESTION AND REFLUX  . predniSONE (DELTASONE) 20 MG tablet Take 2 tabs for migraine PRN once daily.  . prochlorperazine (COMPAZINE) 5 MG tablet TAKE 1 TABLET BY MOUTH 3 TIMES A DAY AS NEEDED FOR NAUSEA  . Riboflavin 100 MG TABS Take 100 mg by mouth. bid  . rosuvastatin (CRESTOR) 40 MG tablet   . tiZANidine (ZANAFLEX) 2 MG tablet TAKE 1 TABLET (2 MG TOTAL) BY MOUTH EVERY 6 (SIX) HOURS AS NEEDED FOR MUSCLE SPASMS.  Marland Kitchen topiramate (TOPAMAX) 100 MG tablet Take 1 tablet (100 mg total) by mouth 2 (two) times daily.  . traMADol (ULTRAM) 50 MG tablet Take 1 tablet (50 mg total) by mouth every 6 (six) hours as needed.  . vitamin B-12 (CYANOCOBALAMIN) 1000 MCG tablet Take 1,000 mcg by mouth daily.  . Vitamin D, Ergocalciferol, (DRISDOL) 50000 units CAPS capsule TAKE 1 CAPSULE DAILY  . amphetamine-dextroamphetamine (ADDERALL) 10 MG tablet Take 1/2 to 1 tablet 1 or 2 x daily for ADD   No current facility-administered medications on file prior to visit.     ROS: all negative except above.   Physical Exam:  BP 128/72   Pulse 76   Temp 98.6 F (37 C)   Resp 16   Ht 5\' 9"  (1.753 m)   Wt 165 lb 6.4 oz (75 kg)   SpO2 98%   BMI 24.43 kg/m   General Appearance: Well nourished, in no apparent distress. Eyes: PERRLA, EOMs, conjunctiva no swelling or erythema, normal fundi and vessels. Sinuses: No frontal/maxillary tenderness ENT/Mouth: EACs patent / TMs  nl. Nares clear without erythema, swelling, mucoid exudates. Oral hygiene is good. No erythema, swelling, or exudate. Tongue normal, non-obstructing. Tonsils not swollen or erythematous. Hearing normal. Neck: Supple, thyroid normal. No bruits, nodes or JVD. Respiratory: Respiratory effort normal.  BS equal and clear bilateral without  rales, rhonci, wheezing or stridor. Cardio: Heart sounds are normal with regular rate and rhythm and no murmurs, rubs or gallops. Peripheral pulses are normal and equal bilaterally without edema. No aortic or femoral bruits. Chest: symmetric with normal excursions and percussion. Lymphatics: Non tender without lymphadenopathy.  Musculoskeletal:  Mild spastic Lt HP and flexion type contracturing of the Lt wrist and hand. Otherwise full ROM of the Rt extremities Skin: Warm and dry without rashes, lesions, cyanosis, clubbing or  ecchymosis.  Neuro: Cranial nerves intact, reflexes slightly increased on the left. No cerebellar symptoms. Sensation intact.  Pysch: Alert and oriented X 3, normal affect.     Quentin Mulling, PA-C 3:18 PM  [facility]

## 2017-12-26 ENCOUNTER — Encounter

## 2017-12-26 ENCOUNTER — Encounter: Payer: Self-pay | Admitting: Neurology

## 2017-12-26 ENCOUNTER — Ambulatory Visit (INDEPENDENT_AMBULATORY_CARE_PROVIDER_SITE_OTHER): Payer: BLUE CROSS/BLUE SHIELD | Admitting: Neurology

## 2017-12-26 VITALS — BP 141/90 | HR 94 | Ht 68.5 in | Wt 165.0 lb

## 2017-12-26 DIAGNOSIS — IMO0002 Reserved for concepts with insufficient information to code with codable children: Secondary | ICD-10-CM

## 2017-12-26 DIAGNOSIS — G43709 Chronic migraine without aura, not intractable, without status migrainosus: Secondary | ICD-10-CM

## 2017-12-26 DIAGNOSIS — I69954 Hemiplegia and hemiparesis following unspecified cerebrovascular disease affecting left non-dominant side: Secondary | ICD-10-CM

## 2017-12-26 MED ORDER — BUTALBITAL-APAP-CAFFEINE 50-325-40 MG PO TABS: 1.0000 | ORAL_TABLET | Freq: Every day | ORAL | 5 refills | Status: DC | PRN

## 2017-12-26 NOTE — Progress Notes (Signed)
**  Botox 100 units x 4 vials, NDC 1610-9604-54, [id number], Exp 05/2020, [facility].//mck,rn**

## 2017-12-26 NOTE — Patient Instructions (Signed)
CGRP antagonist  Ajovy Aimovig [facility]

## 2017-12-26 NOTE — Progress Notes (Signed)
Chief Complaint  Patient presents with  . Migraines/Left Spastic Hemiplegia    Botox 100 units x 4 vials - specialty pharmacy      PATIENT: Ruth Gregory DOB: [birth year]  Chief Complaint  Patient presents with  . Migraines/Left Spastic Hemiplegia    Botox 100 units x 4 vials - specialty pharmacy     HISTORICAL  Ruth Gregory is a 57 year old right-handed female  History of right internal carotid section with spastic left hemiparesis, Botox injection every 3 months  She has history of right internal carotid artery dissection following an episode of vomiting from migraine headache in mid June 2009. She was given IV TPA and found to have right MCA occlusion. She underwent emergent right carotid artery stent with distal endovascular recanalization of the middle cerebral artery.  She had small hemorrhagic transformation of the right basal ganglia with mild left hemiparesis, but has done well since then and has been independent.  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  She has been receiving BOTOX injection since 01/2009 every 3 months, to her left upper extremity and also left lower extremity, responded well.  Repeat US carotid were normal in October 2013  Chronic migraine headaches: She had long-standing history of chronic migraine, average 2-4 headaches each months, she has increased frequency headaches around fall, and spring, she is now taking Topamax ER 150 mg every night as preventive  medication, magnesium oxide 400 mg, riboflavin 100 mg twice a day,  She is not candidate for triptan because previous history of stroke, Cambia works well for her, she her insurance only allow her to have 9 tablets each months. Occasionally she take Fioricet, hydrocodone as needed, even prednisone 20 mg as needed as rescue therapy  Over the past few weeks, she been having to 3 headaches each week, used out her cambia, she also complains of neck muscle tightness, receiving chiropractor sometimes   Last Botox injection to spastic left upper extremity works well, but she noticed left hand muscle weakness.  UPDATE Mar 26 2015: She responded very well to previous Botox injection in September 2016, but noticed increased left hand weakness, gradually recovered over the past few weeks  Update July 15 2015: She responded very well to previous Botox injection in December 20 eighth 2016, noticed returning of left finger flexion spasticity, left ankle plantarflexion, want today's injection emphasize on above abnormal posturing.  She still has frequent migraine headaches, 1-2 headache each week, some headache was protracted, lasting 1-2 days, require multiple dose of cambia and Fioricet  Update November 20 2015: Patient called in September 09 2015 "She also said the left hand is very weak- 12,3 digits > 4,5. She said last OV she rec'd botox in wrist. She sts she may not be able to get botox next month if she is not feeling better"  She has a lot of pain on her left leg, mainly her left ankle from knee down, throbbing, tooth ache, she noticed more tendency of left ankle plantar flexion.  She  continue have intermittent migraine headaches, more at the left retro-orbital  Area  UPDATE Dec 6th 2017: She responded very well to previous Botox injection, complains frequent migraine headaches, once a week, lasting 1-2 days, partial response to combination of cambia, Fioricet, Compazine, she is taking Topamax ER 150 mg  as preventive medications  UPDATE June 30 2016: She had her first Botox injection for migraine prevention in December 2017, with only used 75 units she reported dramatic improvement, she only has migraines about once a week, which is milder, better controlled by the medication, instead of 2-3 times more severe headaches, at the end of the benefit, she noticed recurrent of her headaches, she also noticed mild left shoulder stiffness, left ankle turned in,  She noticed worsening right knee pain, recently had right knee injection.  Update November 04 2016: She responded well to previous injection, Botox injection to bilateral frontal temporal region has helped her headache,  UPDATE Mar 11 2017: She responded well to previous injection in August 2018.  Reported increased migraine headache over the past few weeks,  UPDATE Aug 08 2017: She is having migraine almost every week, lasting for 2 days, she is hesitate   UPDATE Sept 30 2019: She missed her appointment in August, she was given toradol and pheneran by pcp recently for protracted migraine headache, which did help her headache, but less effective than Depacon  She continue have 1 or 2 migraine headaches 1 to 2 weeks, also noticed increased left ankle spasticity, unsteady gait  Review of system: Pertinent as above   ALLERGIES: Allergies  Allergen Reactions  . Penicillins Anaphylaxis and Hives  . Imitrex [Sumatriptan]   . Other Swelling    kiwi  . Sulfa Antibiotics Hives  . Zomig [Zolmitriptan]     HOME MEDICATIONS: Current Outpatient Medications  Medication Sig Dispense Refill  . albuterol (PROVENTIL HFA) 108 (90 Base) MCG/ACT inhaler 1 to 2 inhalations 10-15 minutes apart every 4 hours if needed for asthma rescue 48 g 3  . ALPRAZolam (XANAX) 1 MG tablet TAKE 1/2 TO 1 TABLET 2 TO 3 X / DAY AND PLEASE TRY TO LIMIT TO 5 DAYS /WEEK TO AVOID ADDICTION (Patient taking differently: Take one tablet as needed) 90 tablet 0  . aspirin EC 81  MG tablet Take 81 mg by mouth daily.    . Botulinum Toxin Type A (BOTOX) 200 UNITS SOLR Inject as directed. Change to BOTOX A 100  units    . buPROPion (WELLBUTRIN XL) 300 MG 24 hr tablet TAKE 1 TABLET (300 MG TOTAL) BY MOUTH DAILY. WITH FOOD 90 tablet 3  . butalbital-acetaminophen-caffeine (FIORICET, ESGIC) 50-325-40 MG tablet TAKE 1 TABLET BY MOUTH EVERY 6 HOURS AS NEEDED FOR HEADACHE 30 tablet 5  . cetirizine (ZYRTEC) 10 MG tablet Take 10 mg by mouth daily.    . Diclofenac Potassium (CAMBIA) 50 MG PACK Take 1 packet at onset of migraine. Can repeat in 2 hr once if needed. No more than 2 doses in 24hr. 9 each 1  . fenofibrate micronized (LOFIBRA) 134 MG capsule TAKE 1 CAPSULE EVERY DAY 90 capsule 3  . fluticasone furoate-vilanterol (BREO ELLIPTA) 100-25 MCG/INH AEPB 1 Inhalation daily to prevent Asthma 180 each 3  . levothyroxine (SYNTHROID, LEVOTHROID) 100 MCG tablet TAKE 1 TABLET BY MOUTH EVERY DAY 90 tablet 1  . magnesium oxide (MAG-OX) 400 MG tablet Take 400 mg by mouth 2 (two) times daily.     . montelukast (SINGULAIR) 10 MG tablet TAKE 1 TABLET BY  MOUTH AT BEDTIME 90 tablet 1  . Multiple Vitamin (MULTIVITAMIN WITH MINERALS) TABS tablet Take 1 tablet by mouth daily.    . nortriptyline (PAMELOR) 25 MG capsule Take 2 capsules (50 mg total) by mouth at bedtime. 180 capsule 3  . omeprazole (PRILOSEC) 40 MG capsule TAKE 1 CAPSULE (40 MG TOTAL) BY MOUTH DAILY. FOR ACID INDIGESTION AND REFLUX 30 capsule 5  . prochlorperazine (COMPAZINE) 5 MG tablet TAKE 1 TABLET BY MOUTH 3 TIMES A DAY AS NEEDED FOR NAUSEA 50 tablet 1  . Riboflavin 100 MG TABS Take 100 mg by mouth. bid    . rosuvastatin (CRESTOR) 40 MG tablet     . tiZANidine (ZANAFLEX) 2 MG tablet TAKE 1 TABLET (2 MG TOTAL) BY MOUTH EVERY 6 (SIX) HOURS AS NEEDED FOR MUSCLE SPASMS. 30 tablet 6  . topiramate (TOPAMAX) 100 MG tablet Take 1 tablet (100 mg total) by mouth 2 (two) times daily. 90 tablet 4  . traMADol (ULTRAM) 50 MG tablet Take 1 tablet  (50 mg total) by mouth every 6 (six) hours as needed. 30 tablet 3  . vitamin B-12 (CYANOCOBALAMIN) 1000 MCG tablet Take 1,000 mcg by mouth daily.    . Vitamin D, Ergocalciferol, (DRISDOL) 50000 units CAPS capsule TAKE 1 CAPSULE DAILY 30 capsule 1  . amphetamine-dextroamphetamine (ADDERALL) 10 MG tablet Take 1/2 to 1 tablet 1 or 2 x daily for ADD 60 tablet 0   No current facility-administered medications for this visit.     PAST MEDICAL HISTORY: Past Medical History:  Diagnosis Date  . Anemia   . Anxiety   . Asthma   . Chronic heartburn   . Depression   . Elevated cholesterol    on Crestor  . Headache(784.0)   . Hypertension   . Hypothyroidism    S/P thyroidectomy  . Migraine   . Occlusion and stenosis of carotid artery with cerebral infarction   . Spasm of muscle   . Unspecified cerebral artery occlusion with cerebral infarction   . Vitamin D deficiency     PAST SURGICAL HISTORY: Past Surgical History:  Procedure Laterality Date  . AUGMENTATION MAMMAPLASTY Bilateral 2000  . COSMETIC SURGERY    . CYSTOCELE REPAIR N/A 01/08/2013   Procedure: ANTERIOR REPAIR (CYSTOCELE);  Surgeon: Meriel Pica, MD;  Location: [facility];  Service: Gynecology;  Laterality: N/A;  . NASAL SINUS SURGERY    . VAGINAL HYSTERECTOMY      FAMILY HISTORY: Family History  Problem Relation Age of Onset  . Heart disease Mother   . Peptic Ulcer Disease Mother   . Heart disease Father   . Parkinson's disease Father     SOCIAL HISTORY:  Social History   Socioeconomic History  . Marital status: Married    Spouse name: Brett Canales  . Number of children: 2  . Years of education: college  . Highest education level: Not on file  Occupational History  . Occupation: DESIGNER    Employer: SELF  Social Needs  . Financial resource strain: Not on file  . Food insecurity:    Worry: Not on file    Inability: Not on file  . Transportation needs:    Medical: Not on file    Non-medical: Not on file    Tobacco Use  . Smoking status: Never Smoker  . Smokeless tobacco: Never Used  Substance and Sexual Activity  . Alcohol use: Yes    Comment: occassionally drinks a glass of wine  . Drug use: No  . Sexual  activity: Not on file  Lifestyle  . Physical activity:    Days per week: Not on file    Minutes per session: Not on file  . Stress: Not on file  Relationships  . Social connections:    Talks on phone: Not on file    Gets together: Not on file    Attends religious service: Not on file    Active member of club or organization: Not on file    Attends meetings of clubs or organizations: Not on file    Relationship status: Not on file  . Intimate partner violence:    Fear of current or ex partner: Not on file    Emotionally abused: Not on file    Physically abused: Not on file    Forced sexual activity: Not on file  Other Topics Concern  . 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 Patient is married and lives with her husband. Patient has college education.   Right handed.   Caffeine- two cups daily.     PHYSICAL EXAM   Vitals:   12/26/17 1041  BP: (!) 141/90  Pulse: 94  Weight: 165 lb (74.8 kg)  Height: 5' 8.5" (1.74 m)    Not recorded      Body mass index is 24.72 kg/m.  PHYSICAL EXAMNIATION:  Gen: NAD, conversant, well nourised, obese, well groomed                     Cardiovascular: Regular rate rhythm, no peripheral edema, warm, nontender. Eyes: Conjunctivae clear without exudates or hemorrhage Neck: Supple, no carotid bruise. Pulmonary: Clear to auscultation bilaterally   NEUROLOGICAL EXAM:  MENTAL STATUS: Speech:    Speech is normal; fluent and spontaneous with normal comprehension.  Cognition:     Orientation to time, place and person     Normal recent and remote memory     Normal Attention span and concentration     Normal Language, naming, repeating,spontaneous speech     Fund of  knowledge   CRANIAL NERVES: CN II: Visual fields are full to confrontation. Fundoscopic exam is normal with sharp discs and no vascular changes. Pupils are round equal and briskly reactive to light. CN III, IV, VI: extraocular movement are normal. No ptosis. CN V: Facial sensation is intact to pinprick in all 3 divisions bilaterally. Corneal responses are intact.  CN VII: Face is symmetric with normal eye closure and smile. CN VIII: Hearing is normal to rubbing fingers CN IX, X: Palate elevates symmetrically. Phonation is normal. CN XI: Head turning and shoulder shrug are intact CN XII: Tongue is midline with normal movements and no atrophy.  MOTOR: Mild spastic left hemiparesis, left upper extremity proximal and distal 4/5, mild left hip flexion and left ankle dorsiflexion weakness  REFLEXES: Reflexes are 2+ and symmetric at the biceps, triceps, knees, and ankles. Plantar responses are flexor.  SENSORY: Intact to light touch, pinprick, position sense, and vibration sense are intact in fingers and toes.  COORDINATION: Rapid alternating movements and fine finger movements are intact. There is no dysmetria on finger-to-nose and heel-knee-shin.    GAIT/STANCE: Mildly unsteady gait, checking her left foot across the floor  Romberg is absent.   DIAGNOSTIC DATA (LABS, IMAGING, TESTING) - I reviewed patient records, labs, notes, testing and imaging myself where available.   ASSESSMENT AND PLAN  Ruth Gregory is a 57 y.o. female   Chronic migraine headaches  Continue preventive medications, extended release topiramate 150 mg every day  Cambia, Fioricet, Compazine as needed,+ tizanidine prn  Continue magnesium oxide, riboflavin as preventive medications,  Add on nortriptyline 25 mg titrating to 50 every night as migraine prevention   Spastic left hemiparesis following right internal carotid artery dissection, right MCA stroke  Electrical stimulation guided Botox injection, used 300  units of Botox A  Left pronator teres 25 units Left flexor digitorum profoundi 25 units Left pectoralis major 50 units Left palmaris longus 25 units Left levator scapular 25 units  Left tibialis posterior 50 units Left flexor digitorum longus 25 unitsx2=50 units  I also injected  100 units for chronic migraine headache  Frontalis 20 Temporalis 40 units Upper trapezius 30 units Occipital 25 units Left masseter 10 units 25 units to bilateral vortex region   Also provide information on CGRP antagonist.  Levert Feinstein, M.D. Ph.D.  [facility] [address] [place], [place] [address] Ph: [phone] Fax: [phone]  CC: Referring Provider

## 2018-01-03 ENCOUNTER — Encounter: Payer: Self-pay | Admitting: Physician Assistant

## 2018-01-03 ENCOUNTER — Ambulatory Visit (INDEPENDENT_AMBULATORY_CARE_PROVIDER_SITE_OTHER): Payer: BLUE CROSS/BLUE SHIELD | Admitting: Physician Assistant

## 2018-01-03 VITALS — BP 116/90 | HR 103 | Temp 97.5°F | Ht 68.5 in | Wt 164.6 lb

## 2018-01-03 DIAGNOSIS — N390 Urinary tract infection, site not specified: Secondary | ICD-10-CM

## 2018-01-03 DIAGNOSIS — F988 Other specified behavioral and emotional disorders with onset usually occurring in childhood and adolescence: Secondary | ICD-10-CM

## 2018-01-03 MED ORDER — CIPROFLOXACIN HCL 500 MG PO TABS: 500.0000 mg | ORAL_TABLET | Freq: Two times a day (BID) | ORAL | 0 refills | Status: DC

## 2018-01-03 MED ORDER — AMPHETAMINE-DEXTROAMPHETAMINE 10 MG PO TABS: ORAL_TABLET | ORAL | 0 refills | Status: DC

## 2018-01-03 NOTE — Patient Instructions (Signed)

## 2018-01-03 NOTE — Progress Notes (Signed)
Subjective:    Patient ID: Ruth Gregory, female    DOB: [birth year], 57 y.o.   MRN: [id number]  HPI 57 y.o. WF with history of CVA presents with UTI x Saturday    She has had dysuria, dizziness, vaginal pain with dryness/swelling, frequency, some chills/fever on Friday. Some back pain.  She denies blood in urine,  flank pain.   Blood pressure 116/90, pulse (!) 103, temperature (!) 97.5 F (36.4 C), height 5' 8.5" (1.74 m), weight 164 lb 9.6 oz (74.7 kg), SpO2 97 %.  Medications Current Outpatient Medications on File Prior to Visit  Medication Sig  . albuterol (PROVENTIL HFA) 108 (90 Base) MCG/ACT inhaler 1 to 2 inhalations 10-15 minutes apart every 4 hours if needed for asthma rescue  . ALPRAZolam (XANAX) 1 MG tablet TAKE 1/2 TO 1 TABLET 2 TO 3 X / DAY AND PLEASE TRY TO LIMIT TO 5 DAYS /WEEK TO AVOID ADDICTION (Patient taking differently: Take one tablet as needed)  . aspirin EC 81 MG tablet Take 81 mg by mouth daily.  . Botulinum Toxin Type A (BOTOX) 200 UNITS SOLR Inject as directed. Change to BOTOX A 100  units  . buPROPion (WELLBUTRIN XL) 300 MG 24 hr tablet TAKE 1 TABLET (300 MG TOTAL) BY MOUTH DAILY. WITH FOOD  . butalbital-acetaminophen-caffeine (FIORICET, ESGIC) 50-325-40 MG tablet Take 1 tablet by mouth daily as needed for headache. Do not refill in less than 30 days,  . cetirizine (ZYRTEC) 10 MG tablet Take 10 mg by mouth daily.  . Diclofenac Potassium (CAMBIA) 50 MG PACK Take 1 packet at onset of migraine. Can repeat in 2 hr once if needed. No more than 2 doses in 24hr.  . fenofibrate micronized (LOFIBRA) 134 MG capsule TAKE 1 CAPSULE EVERY DAY  . fluticasone furoate-vilanterol (BREO ELLIPTA) 100-25 MCG/INH AEPB 1 Inhalation daily to prevent Asthma  . levothyroxine (SYNTHROID, LEVOTHROID) 100 MCG tablet TAKE 1 TABLET BY MOUTH EVERY DAY  . magnesium oxide (MAG-OX) 400 MG tablet Take 400 mg by mouth 2 (two) times daily.   . montelukast (SINGULAIR) 10 MG tablet TAKE 1 TABLET BY MOUTH  AT BEDTIME  . Multiple Vitamin (MULTIVITAMIN WITH MINERALS) TABS tablet Take 1 tablet by mouth daily.  . nortriptyline (PAMELOR) 25 MG capsule Take 2 capsules (50 mg total) by mouth at bedtime.  Marland Kitchen omeprazole (PRILOSEC) 40 MG capsule TAKE 1 CAPSULE (40 MG TOTAL) BY MOUTH DAILY. FOR ACID INDIGESTION AND REFLUX  . prochlorperazine (COMPAZINE) 5 MG tablet TAKE 1 TABLET BY MOUTH 3 TIMES A DAY AS NEEDED FOR NAUSEA  . Riboflavin 100 MG TABS Take 100 mg by mouth. bid  . rosuvastatin (CRESTOR) 40 MG tablet   . tiZANidine (ZANAFLEX) 2 MG tablet TAKE 1 TABLET (2 MG TOTAL) BY MOUTH EVERY 6 (SIX) HOURS AS NEEDED FOR MUSCLE SPASMS.  Marland Kitchen topiramate (TOPAMAX) 100 MG tablet Take 1 tablet (100 mg total) by mouth 2 (two) times daily.  . traMADol (ULTRAM) 50 MG tablet Take 1 tablet (50 mg total) by mouth every 6 (six) hours as needed.  . vitamin B-12 (CYANOCOBALAMIN) 1000 MCG tablet Take 1,000 mcg by mouth daily.  . Vitamin D, Ergocalciferol, (DRISDOL) 50000 units CAPS capsule TAKE 1 CAPSULE DAILY  . amphetamine-dextroamphetamine (ADDERALL) 10 MG tablet Take 1/2 to 1 tablet 1 or 2 x daily for ADD   No current facility-administered medications on file prior to visit.     Problem list She has History of CVA (cerebrovascular accident); Chronic migraine; Spastic hemiplegia  affecting left nondominant side (HCC); Mixed hyperlipidemia; Sinusitis, chronic; Elevated blood pressure reading without diagnosis of hypertension; Vitamin D deficiency; Medication management; Prediabetes; ADD (attention deficit disorder); Fatigue; Hypothyroidism; Acute pain of right knee; GERD (gastroesophageal reflux disease); Anxiety; and Fatty liver on their problem list.   Review of Systems  Constitutional: Negative for chills.  HENT: Negative.   Respiratory: Negative.   Cardiovascular: Negative.   Gastrointestinal: Negative for abdominal pain, nausea and vomiting.  Genitourinary: Positive for dysuria, frequency and urgency. Negative for  decreased urine volume, difficulty urinating, dyspareunia, enuresis, flank pain, genital sores, hematuria, menstrual problem, pelvic pain, vaginal bleeding and vaginal discharge.  Musculoskeletal: Negative for back pain.  Skin: Negative for rash.       Objective:   Physical Exam  Constitutional: She is oriented to person, place, and time. She appears well-developed and well-nourished.  Neck: Normal range of motion. Neck supple.  Cardiovascular: Normal rate and regular rhythm.  Pulmonary/Chest: Effort normal and breath sounds normal.  Abdominal: Soft. Bowel sounds are normal. She exhibits no distension and no mass. There is tenderness (suprapubic). There is no rebound and no guarding.  Musculoskeletal: Normal range of motion. She exhibits no tenderness.  Neurological: She is alert and oriented to person, place, and time.  Skin: Skin is warm and dry.      Assessment & Plan:   Urinary frequency -     Urinalysis, Routine w reflex microscopic -     Urine Culture -     ciprofloxacin (CIPRO) 500 MG tablet; Take 1 tablet (500 mg total) by mouth 2 (two) times daily for 7 days.

## 2018-01-05 LAB — URINALYSIS, ROUTINE W REFLEX MICROSCOPIC
Bilirubin Urine: NEGATIVE
Glucose, UA: NEGATIVE
Hgb urine dipstick: NEGATIVE
Hyaline Cast: NONE SEEN /LPF
Ketones, ur: NEGATIVE
Nitrite: POSITIVE — AB
Specific Gravity, Urine: 1.02 (ref 1.001–1.03)
pH: 5.5 (ref 5.0–8.0)

## 2018-01-05 LAB — URINE CULTURE
MICRO NUMBER:: 91212340
SPECIMEN QUALITY:: ADEQUATE

## 2018-01-08 NOTE — Progress Notes (Signed)
This very nice 57 y.o. MWF presents for  6 month follow up with HTN, HLD, Migraine, Hypothyroidism,  Pre-Diabetes and Vitamin D Deficiency.      Patient is followed expectantly for labile HTN & BP has been allegedly controlled at home. Today's BP is elevated at 140/94 and rechecked at 144/104.  Patient has had no complaints of any cardiac type chest pain, palpitations, dyspnea / orthopnea / PND, dizziness, claudication, or dependent edema.     In 2009, she had a Right MCA CVA attributed to a Right Carotid artery dissection from fibromuscular dysplasia and has residua of a mild spastic Left HP and mild cognitive dysfunction difficulty focusing/concentrating. These latter sx's are improved some on Adderall. Patient ia also followed by Neurologist - Dr Terrace Arabia who also administers Bo-Tox injections for her spasticity and migraines.      Hyperlipidemia is controlled with diet & meds. Patient denies myalgias or other med SE's. Last Lipids were  Lab Results  Component Value Date   CHOL 146 10/07/2017   HDL 58 10/07/2017   LDLCALC 68 10/07/2017   TRIG 121 10/07/2017   CHOLHDL 2.5 10/07/2017      Also, the patient has history of PreDiabetes  A1c 5.8%/2008)  and has had no symptoms of reactive hypoglycemia, diabetic polys, paresthesias or visual blurring.  Last A1c was at goal: Lab Results  Component Value Date   HGBA1C 5.6 06/20/2017      Patient has been on Thyroid Supression since Thyroid surgery for benign nodule in  in 2007.      Further, the patient also has history of Vitamin D Deficiency ("23"/2008)  and supplements vitamin D without any suspected side-effects. Last vitamin D was at goal:  Lab Results  Component Value Date   VD25OH 69 06/20/2017   Current Outpatient Medications on File Prior to Visit  Medication Sig  . albuterol (PROVENTIL HFA) 108 (90 Base) MCG/ACT inhaler 1 to 2 inhalations 10-15 minutes apart every 4 hours if needed for asthma rescue  . ALPRAZolam (XANAX) 1 MG  tablet TAKE 1/2 TO 1 TABLET 2 TO 3 X / DAY AND PLEASE TRY TO LIMIT TO 5 DAYS /WEEK TO AVOID ADDICTION (Patient taking differently: Take one tablet as needed)  . amphetamine-dextroamphetamine (ADDERALL) 10 MG tablet Take 1/2 to 1 tablet 1 or 2 x daily for ADD  . aspirin EC 81 MG tablet Take 81 mg by mouth daily.  . Botulinum Toxin Type A (BOTOX) 200 UNITS SOLR Inject as directed. Change to BOTOX A 100  units  . buPROPion (WELLBUTRIN XL) 300 MG 24 hr tablet TAKE 1 TABLET (300 MG TOTAL) BY MOUTH DAILY. WITH FOOD  . butalbital-acetaminophen-caffeine (FIORICET, ESGIC) 50-325-40 MG tablet Take 1 tablet by mouth daily as needed for headache. Do not refill in less than 30 days,  . cetirizine (ZYRTEC) 10 MG tablet Take 10 mg by mouth daily.  . ciprofloxacin (CIPRO) 500 MG tablet Take 1 tablet (500 mg total) by mouth 2 (two) times daily for 7 days.  . Diclofenac Potassium (CAMBIA) 50 MG PACK Take 1 packet at onset of migraine. Can repeat in 2 hr once if needed. No more than 2 doses in 24hr.  . fenofibrate micronized (LOFIBRA) 134 MG capsule TAKE 1 CAPSULE EVERY DAY  . fluticasone furoate-vilanterol (BREO ELLIPTA) 100-25 MCG/INH AEPB 1 Inhalation daily to prevent Asthma  . levothyroxine (SYNTHROID, LEVOTHROID) 100 MCG tablet TAKE 1 TABLET BY MOUTH EVERY DAY  . magnesium oxide (MAG-OX) 400  MG tablet Take 400 mg by mouth 2 (two) times daily.   . montelukast (SINGULAIR) 10 MG tablet TAKE 1 TABLET BY MOUTH AT BEDTIME  . Multiple Vitamin (MULTIVITAMIN WITH MINERALS) TABS tablet Take 1 tablet by mouth daily.  . nortriptyline (PAMELOR) 25 MG capsule Take 2 capsules (50 mg total) by mouth at bedtime.  Marland Kitchen omeprazole (PRILOSEC) 40 MG capsule TAKE 1 CAPSULE (40 MG TOTAL) BY MOUTH DAILY. FOR ACID INDIGESTION AND REFLUX  . prochlorperazine (COMPAZINE) 5 MG tablet TAKE 1 TABLET BY MOUTH 3 TIMES A DAY AS NEEDED FOR NAUSEA  . Riboflavin 100 MG TABS Take 100 mg by mouth. bid  . rosuvastatin (CRESTOR) 40 MG tablet   .  tiZANidine (ZANAFLEX) 2 MG tablet TAKE 1 TABLET (2 MG TOTAL) BY MOUTH EVERY 6 (SIX) HOURS AS NEEDED FOR MUSCLE SPASMS.  Marland Kitchen topiramate (TOPAMAX) 100 MG tablet Take 1 tablet (100 mg total) by mouth 2 (two) times daily.  . traMADol (ULTRAM) 50 MG tablet Take 1 tablet (50 mg total) by mouth every 6 (six) hours as needed.  . vitamin B-12 (CYANOCOBALAMIN) 1000 MCG tablet Take 1,000 mcg by mouth daily.  . Vitamin D, Ergocalciferol, (DRISDOL) 50000 units CAPS capsule TAKE 1 CAPSULE DAILY   No current facility-administered medications on file prior to visit.    Allergies  Allergen Reactions  . Penicillins Anaphylaxis and Hives  . Imitrex [Sumatriptan]   . Other Swelling    kiwi  . Sulfa Antibiotics Hives  . Zomig [Zolmitriptan]    PMHx:   Past Medical History:  Diagnosis Date  . Anemia   . Anxiety   . Asthma   . Chronic heartburn   . Depression   . Elevated cholesterol    on Crestor  . Headache(784.0)   . Hypertension   . Hypothyroidism    S/P thyroidectomy  . Migraine   . Occlusion and stenosis of carotid artery with cerebral infarction   . Spasm of muscle   . Unspecified cerebral artery occlusion with cerebral infarction   . Vitamin D deficiency    Immunization History  Administered Date(s) Administered  . Influenza Inj Mdck Quad With Preservative 01/20/2017  . Influenza Split 01/24/2014  . Influenza, Seasonal, Injecte, Preservative Fre 02/09/2016  . Influenza-Unspecified 11/28/2011  . PPD Test 04/04/2013, 04/22/2015, 05/18/2016, 06/20/2017  . Pneumococcal Polysaccharide-23 03/29/1998  . Td 03/29/2002  . Tdap 05/18/2016   Past Surgical History:  Procedure Laterality Date  . AUGMENTATION MAMMAPLASTY Bilateral 2000  . COSMETIC SURGERY    . CYSTOCELE REPAIR N/A 01/08/2013   Procedure: ANTERIOR REPAIR (CYSTOCELE);  Surgeon: Meriel Pica, MD;  Location: [facility];  Service: Gynecology;  Laterality: N/A;  . NASAL SINUS SURGERY    . VAGINAL HYSTERECTOMY     FHx:     Reviewed / unchanged  SHx:    Reviewed / unchanged   Systems Review:  Constitutional: Denies fever, chills, wt changes, headaches, insomnia, fatigue, night sweats, change in appetite. Eyes: Denies redness, blurred vision, diplopia, discharge, itchy, watery eyes.  ENT: Denies discharge, congestion, post nasal drip, epistaxis, sore throat, earache, hearing loss, dental pain, tinnitus, vertigo, sinus pain, snoring.  CV: Denies chest pain, palpitations, irregular heartbeat, syncope, dyspnea, diaphoresis, orthopnea, PND, claudication or edema. Respiratory: denies cough, dyspnea, DOE, pleurisy, hoarseness, laryngitis, wheezing.  Gastrointestinal: Denies dysphagia, odynophagia, heartburn, reflux, water brash, abdominal pain or cramps, nausea, vomiting, bloating, diarrhea, constipation, hematemesis, melena, hematochezia  or hemorrhoids. Genitourinary: Denies dysuria, frequency, urgency, nocturia, hesitancy, discharge, hematuria or flank pain.  Musculoskeletal: Denies arthralgias, myalgias, stiffness, jt. swelling, pain, limping or strain/sprain.  Skin: Denies pruritus, rash, hives, warts, acne, eczema or change in skin lesion(s). Neuro: No weakness, tremor, incoordination, spasms, paresthesia or pain. Psychiatric: Denies confusion, memory loss or sensory loss. Endo: Denies change in weight, skin or hair change.  Heme/Lymph: No excessive bleeding, bruising or enlarged lymph nodes.  Physical Exam  BP (!) 140/94   Pulse 72   Temp (!) 97.3 F (36.3 C)   Resp 16   Ht 5' 8.5" (1.74 m)   Wt 162 lb 8 oz (73.7 kg)   BMI 24.35 kg/m   Appears  well nourished, well groomed  and in no distress.  Eyes: PERRLA, EOMs, conjunctiva no swelling or erythema. Sinuses: No frontal/maxillary tenderness ENT/Mouth: EAC's clear, TM's nl w/o erythema, bulging. Nares clear w/o erythema, swelling, exudates. Oropharynx clear without erythema or exudates. Oral hygiene is good. Tongue normal, non obstructing. Hearing  intact.  Neck: Supple. Thyroid not palpable. Car 2+/2+ without bruits, nodes or JVD. Chest: Respirations nl with BS clear & equal w/o rales, rhonchi, wheezing or stridor.  Cor: Heart sounds normal w/ regular rate and rhythm without sig. murmurs, gallops, clicks or rubs. Peripheral pulses normal and equal  without edema.  Abdomen: Soft & bowel sounds normal. Non-tender w/o guarding, rebound, hernias, masses or organomegaly.  Lymphatics: Unremarkable.  Musculoskeletal: Full ROM all peripheral extremities, joint stability, 5/5 strength and normal gait.  Skin: Warm, dry without exposed rashes, lesions or ecchymosis apparent.  Neuro: Cranial nerves intact, reflexes equal bilaterally. Sensory-motor testing grossly intact. Tendon reflexes grossly intact.  Pysch: Alert & oriented x 3.  Insight and judgement nl & appropriate. No ideations.  Assessment and Plan:  1. Labile hypertension  - Continue medication, monitor blood pressure at home.  - Continue DASH diet.  Reminder to go to the ER if any CP,  SOB, nausea, dizziness, severe HA, changes vision/speech.  - CBC with Differential/Platelet - COMPLETE METABOLIC PANEL WITH GFR - Magnesium - TSH  2. Hyperlipidemia, mixed  - Continue diet/meds, exercise,& lifestyle modifications.  - Continue monitor periodic cholesterol/liver & renal functions   - Lipid panel - TSH  3. Prediabetes  - Continue diet, exercise,  - lifestyle modifications.  - Monitor appropriate labs.  - Hemoglobin A1c - Insulin, random  4. Vitamin D deficiency  - Continue supplementation.  - VITAMIN D 25 Hydroxyl  5. Attention deficit disorder  6. Gastroesophageal reflux disease  - CBC with Differential/Platelet  7. Hypothyroidism  - TSH  8. Medication management  - CBC with Differential/Platelet - COMPLETE METABOLIC PANEL WITH GFR - Magnesium - Lipid panel - TSH - Hemoglobin A1c - Insulin, random - VITAMIN D 25 Hydroxyl  9. Need for prophylactic  vaccination and inoculation against influenza  - FLU VACCINE MDCK QUAD W/Preservative       Discussed  regular exercise, BP monitoring, weight control to achieve/maintain BMI less than 25 and discussed med and SE's. Recommended labs to assess and monitor clinical status with further disposition pending results of labs. Over 30 minutes of exam, counseling, chart review was performed.

## 2018-01-08 NOTE — Patient Instructions (Signed)

## 2018-01-09 ENCOUNTER — Encounter: Payer: Self-pay | Admitting: Internal Medicine

## 2018-01-09 ENCOUNTER — Ambulatory Visit (INDEPENDENT_AMBULATORY_CARE_PROVIDER_SITE_OTHER): Payer: BLUE CROSS/BLUE SHIELD | Admitting: Internal Medicine

## 2018-01-09 VITALS — BP 140/94 | HR 72 | Temp 97.3°F | Resp 16 | Ht 68.5 in | Wt 162.5 lb

## 2018-01-09 DIAGNOSIS — Z23 Encounter for immunization: Secondary | ICD-10-CM | POA: Diagnosis not present

## 2018-01-09 DIAGNOSIS — E559 Vitamin D deficiency, unspecified: Secondary | ICD-10-CM | POA: Diagnosis not present

## 2018-01-09 DIAGNOSIS — R0989 Other specified symptoms and signs involving the circulatory and respiratory systems: Secondary | ICD-10-CM | POA: Diagnosis not present

## 2018-01-09 DIAGNOSIS — E039 Hypothyroidism, unspecified: Secondary | ICD-10-CM | POA: Diagnosis not present

## 2018-01-09 DIAGNOSIS — E782 Mixed hyperlipidemia: Secondary | ICD-10-CM

## 2018-01-09 DIAGNOSIS — R7303 Prediabetes: Secondary | ICD-10-CM | POA: Diagnosis not present

## 2018-01-09 DIAGNOSIS — Z79899 Other long term (current) drug therapy: Secondary | ICD-10-CM | POA: Diagnosis not present

## 2018-01-09 DIAGNOSIS — K219 Gastro-esophageal reflux disease without esophagitis: Secondary | ICD-10-CM

## 2018-01-09 DIAGNOSIS — F988 Other specified behavioral and emotional disorders with onset usually occurring in childhood and adolescence: Secondary | ICD-10-CM

## 2018-01-09 MED ORDER — VERAPAMIL HCL ER 240 MG PO TBCR: EXTENDED_RELEASE_TABLET | ORAL | 3 refills | Status: DC

## 2018-01-10 LAB — CBC WITH DIFFERENTIAL/PLATELET
Basophils Absolute: 32 cells/uL (ref 0–200)
Basophils Relative: 0.7 %
Eosinophils Absolute: 161 cells/uL (ref 15–500)
Eosinophils Relative: 3.5 %
HCT: 44.5 % (ref 35.0–45.0)
Hemoglobin: 15.1 g/dL (ref 11.7–15.5)
Lymphs Abs: 1412 cells/uL (ref 850–3900)
MCH: 31.7 pg (ref 27.0–33.0)
MCHC: 33.9 g/dL (ref 32.0–36.0)
MCV: 93.3 fL (ref 80.0–100.0)
MPV: 11.2 fL (ref 7.5–12.5)
Monocytes Relative: 6.3 %
Neutro Abs: 2705 cells/uL (ref 1500–7800)
Neutrophils Relative %: 58.8 %
Platelets: 189 10*3/uL (ref 140–400)
RBC: 4.77 10*6/uL (ref 3.80–5.10)
RDW: 12.3 % (ref 11.0–15.0)
Total Lymphocyte: 30.7 %
WBC mixed population: 290 cells/uL (ref 200–950)
WBC: 4.6 10*3/uL (ref 3.8–10.8)

## 2018-01-10 LAB — LIPID PANEL
Cholesterol: 192 mg/dL (ref <200)
HDL: 59 mg/dL (ref >50)
LDL Cholesterol (Calc): 101 mg/dL (calc) — ABNORMAL HIGH
Non-HDL Cholesterol (Calc): 133 mg/dL (calc) — ABNORMAL HIGH (ref <130)
Total CHOL/HDL Ratio: 3.3 (calc) (ref <5.0)
Triglycerides: 201 mg/dL — ABNORMAL HIGH (ref <150)

## 2018-01-10 LAB — COMPLETE METABOLIC PANEL WITH GFR
AG Ratio: 2 (calc) (ref 1.0–2.5)
ALT: 45 U/L — ABNORMAL HIGH (ref 6–29)
AST: 25 U/L (ref 10–35)
Albumin: 4.4 g/dL (ref 3.6–5.1)
Alkaline phosphatase (APISO): 94 U/L (ref 33–130)
BUN: 13 mg/dL (ref 7–25)
CO2: 24 mmol/L (ref 20–32)
Calcium: 9.3 mg/dL (ref 8.6–10.4)
Chloride: 112 mmol/L — ABNORMAL HIGH (ref 98–110)
Creat: 0.93 mg/dL (ref 0.50–1.05)
GFR, Est African American: 78 mL/min/{1.73_m2} (ref >=60)
GFR, Est Non African American: 67 mL/min/{1.73_m2} (ref >=60)
Globulin: 2.2 g/dL (calc) (ref 1.9–3.7)
Glucose, Bld: 91 mg/dL (ref 65–99)
Potassium: 3.9 mmol/L (ref 3.5–5.3)
Sodium: 143 mmol/L (ref 135–146)
Total Bilirubin: 0.6 mg/dL (ref 0.2–1.2)
Total Protein: 6.6 g/dL (ref 6.1–8.1)

## 2018-01-10 LAB — HEMOGLOBIN A1C
Hgb A1c MFr Bld: 5.6 % of total Hgb (ref <5.7)
Mean Plasma Glucose: 114 (calc)
eAG (mmol/L): 6.3 (calc)

## 2018-01-10 LAB — INSULIN, RANDOM: Insulin: 2.9 u[IU]/mL (ref 2.0–19.6)

## 2018-01-10 LAB — VITAMIN D 25 HYDROXY (VIT D DEFICIENCY, FRACTURES): Vit D, 25-Hydroxy: 47 ng/mL (ref 30–100)

## 2018-01-10 LAB — MAGNESIUM: Magnesium: 1.9 mg/dL (ref 1.5–2.5)

## 2018-01-10 LAB — TSH: TSH: 0.91 mIU/L (ref 0.40–4.50)

## 2018-01-18 ENCOUNTER — Ambulatory Visit: Payer: BLUE CROSS/BLUE SHIELD | Admitting: Neurology

## 2018-01-18 ENCOUNTER — Other Ambulatory Visit: Payer: Self-pay | Admitting: Internal Medicine

## 2018-01-18 DIAGNOSIS — N183 Chronic kidney disease, stage 3 (moderate): Secondary | ICD-10-CM | POA: Diagnosis not present

## 2018-01-18 DIAGNOSIS — D631 Anemia in chronic kidney disease: Secondary | ICD-10-CM | POA: Diagnosis not present

## 2018-01-18 DIAGNOSIS — I129 Hypertensive chronic kidney disease with stage 1 through stage 4 chronic kidney disease, or unspecified chronic kidney disease: Secondary | ICD-10-CM | POA: Diagnosis not present

## 2018-01-18 DIAGNOSIS — N189 Chronic kidney disease, unspecified: Secondary | ICD-10-CM | POA: Diagnosis not present

## 2018-01-18 DIAGNOSIS — E559 Vitamin D deficiency, unspecified: Secondary | ICD-10-CM | POA: Diagnosis not present

## 2018-01-20 ENCOUNTER — Other Ambulatory Visit: Payer: Self-pay | Admitting: Physician Assistant

## 2018-01-21 ENCOUNTER — Other Ambulatory Visit: Payer: Self-pay | Admitting: Nephrology

## 2018-01-21 DIAGNOSIS — N183 Chronic kidney disease, stage 3 unspecified: Secondary | ICD-10-CM

## 2018-01-21 DIAGNOSIS — I129 Hypertensive chronic kidney disease with stage 1 through stage 4 chronic kidney disease, or unspecified chronic kidney disease: Secondary | ICD-10-CM

## 2018-01-24 ENCOUNTER — Ambulatory Visit: Payer: Self-pay | Admitting: Adult Health Nurse Practitioner

## 2018-01-24 ENCOUNTER — Telehealth: Payer: Self-pay | Admitting: Neurology

## 2018-01-24 DIAGNOSIS — G43709 Chronic migraine without aura, not intractable, without status migrainosus: Secondary | ICD-10-CM | POA: Diagnosis not present

## 2018-01-24 NOTE — Telephone Encounter (Signed)
Pt requesting a call stating she has had a migraine for about 2 and a half days, no relief in medication. Pt would like to come in for an infusion please advise

## 2018-01-24 NOTE — Telephone Encounter (Signed)
Per vo by Dr. Terrace Arabia, she can come in for the following IV medications:  1) Depakote 1gram 2)  Toradol 30mg  3) Compazine 5mg   She will be here at 2pm with a driver.  Signed MD orders provided to [facility].

## 2018-01-25 NOTE — Progress Notes (Deleted)
Assessment and Plan:  There are no diagnoses linked to this encounter.    Further disposition pending results of labs. Discussed med's effects and SE's.   Over 30 minutes of exam, counseling, chart review, and critical decision making was performed.   Future Appointments  Date Time Provider Department Center  01/26/2018 10:30 AM Judd Gaudier, NP [facility] None  03/23/2018  2:30 PM Levert Feinstein, MD [facility] None  04/18/2018  2:30 PM Judd Gaudier, NP [facility] None  07/27/2018  3:00 PM Lucky Cowboy, MD [facility] None    ------------------------------------------------------------------------------------------------------------------   HPI There were no vitals taken for this visit.  57 y.o.female presents for evaluation of recurrent UTI symptoms;    she was recently seen on 10/8 c/o typical UTI symptoms, culture was positive for S. haemolyticus and was prescribed a 7 day course of cipro  Past Medical History:  Diagnosis Date  . Anemia   . Anxiety   . Asthma   . Chronic heartburn   . Depression   . Elevated cholesterol    on Crestor  . Headache(784.0)   . Hypertension   . Hypothyroidism    S/P thyroidectomy  . Migraine   . Occlusion and stenosis of carotid artery with cerebral infarction   . Spasm of muscle   . Unspecified cerebral artery occlusion with cerebral infarction   . Vitamin D deficiency      Allergies  Allergen Reactions  . Penicillins Anaphylaxis and Hives  . Imitrex [Sumatriptan]   . Other Swelling    kiwi  . Sulfa Antibiotics Hives  . Zomig [Zolmitriptan]     Current Outpatient Medications on File Prior to Visit  Medication Sig  . albuterol (PROVENTIL HFA) 108 (90 Base) MCG/ACT inhaler 1 to 2 inhalations 10-15 minutes apart every 4 hours if needed for asthma rescue  . ALPRAZolam (XANAX) 1 MG tablet TAKE 1/2 TO 1 TABLET 2 TO 3 X / DAY AND PLEASE TRY TO LIMIT TO 5 DAYS /WEEK TO AVOID ADDICTION (Patient taking differently: Take one tablet as  needed)  . amphetamine-dextroamphetamine (ADDERALL) 10 MG tablet Take 1/2 to 1 tablet 1 or 2 x daily for ADD  . aspirin EC 81 MG tablet Take 81 mg by mouth daily.  . Botulinum Toxin Type A (BOTOX) 200 UNITS SOLR Inject as directed. Change to BOTOX A 100  units  . buPROPion (WELLBUTRIN XL) 300 MG 24 hr tablet TAKE 1 TABLET (300 MG TOTAL) BY MOUTH DAILY. WITH FOOD  . butalbital-acetaminophen-caffeine (FIORICET, ESGIC) 50-325-40 MG tablet Take 1 tablet by mouth daily as needed for headache. Do not refill in less than 30 days,  . cetirizine (ZYRTEC) 10 MG tablet Take 10 mg by mouth daily.  . ciprofloxacin (CIPRO) 500 MG tablet TAKE 1 TABLET BY MOUTH TWICE A DAY FOR 7 DAYS  . Diclofenac Potassium (CAMBIA) 50 MG PACK Take 1 packet at onset of migraine. Can repeat in 2 hr once if needed. No more than 2 doses in 24hr.  . fenofibrate micronized (LOFIBRA) 134 MG capsule TAKE 1 CAPSULE EVERY DAY  . fluticasone furoate-vilanterol (BREO ELLIPTA) 100-25 MCG/INH AEPB 1 Inhalation daily to prevent Asthma  . levothyroxine (SYNTHROID, LEVOTHROID) 100 MCG tablet TAKE 1 TABLET BY MOUTH EVERY DAY  . magnesium oxide (MAG-OX) 400 MG tablet Take 400 mg by mouth 2 (two) times daily.   . montelukast (SINGULAIR) 10 MG tablet TAKE 1 TABLET BY MOUTH EVERYDAY AT BEDTIME  . Multiple Vitamin (MULTIVITAMIN WITH MINERALS) TABS tablet Take 1 tablet by mouth  daily.  . nortriptyline (PAMELOR) 25 MG capsule Take 2 capsules (50 mg total) by mouth at bedtime.  Marland Kitchen omeprazole (PRILOSEC) 40 MG capsule TAKE 1 CAPSULE (40 MG TOTAL) BY MOUTH DAILY. FOR ACID INDIGESTION AND REFLUX  . prochlorperazine (COMPAZINE) 5 MG tablet TAKE 1 TABLET BY MOUTH 3 TIMES A DAY AS NEEDED FOR NAUSEA  . Riboflavin 100 MG TABS Take 100 mg by mouth. bid  . rosuvastatin (CRESTOR) 40 MG tablet   . tiZANidine (ZANAFLEX) 2 MG tablet TAKE 1 TABLET (2 MG TOTAL) BY MOUTH EVERY 6 (SIX) HOURS AS NEEDED FOR MUSCLE SPASMS.  Marland Kitchen topiramate (TOPAMAX) 100 MG tablet Take 1 tablet  (100 mg total) by mouth 2 (two) times daily.  . traMADol (ULTRAM) 50 MG tablet Take 1 tablet (50 mg total) by mouth every 6 (six) hours as needed.  . verapamil (CALAN-SR) 240 MG CR tablet Take 1 tablet every morning on a full stomach for BP & Migraine prevention  . vitamin B-12 (CYANOCOBALAMIN) 1000 MCG tablet Take 1,000 mcg by mouth daily.  . Vitamin D, Ergocalciferol, (DRISDOL) 50000 units CAPS capsule TAKE 1 CAPSULE DAILY   No current facility-administered medications on file prior to visit.     ROS: all negative except above.   Physical Exam:  There were no vitals taken for this visit.  General Appearance: Well nourished, in no apparent distress. Eyes: PERRLA, EOMs, conjunctiva no swelling or erythema Sinuses: No Frontal/maxillary tenderness ENT/Mouth: Ext aud canals clear, TMs without erythema, bulging. No erythema, swelling, or exudate on post pharynx.  Tonsils not swollen or erythematous. Hearing normal.  Neck: Supple, thyroid normal.  Respiratory: Respiratory effort normal, BS equal bilaterally without rales, rhonchi, wheezing or stridor.  Cardio: RRR with no MRGs. Brisk peripheral pulses without edema.  Abdomen: Soft, + BS.  Non tender, no guarding, rebound, hernias, masses. Lymphatics: Non tender without lymphadenopathy.  Musculoskeletal: Full ROM, 5/5 strength, normal gait.  Skin: Warm, dry without rashes, lesions, ecchymosis.  Neuro: Cranial nerves intact. Normal muscle tone, no cerebellar symptoms. Sensation intact.  Psych: Awake and oriented X 3, normal affect, Insight and Judgment appropriate.     Dan Maker, NP 8:53 AM [facility]

## 2018-01-26 ENCOUNTER — Ambulatory Visit: Payer: Self-pay | Admitting: Adult Health

## 2018-02-01 NOTE — Progress Notes (Signed)
Assessment and Plan:  Ruth Gregory was seen today for follow-up.  Diagnoses and all orders for this visit:  Urinary tract infection without hematuria, site unspecified Complete cipro as prescribed, not symptomatic today Discussed hygiene and UTI prevention; she is establishd with GYN if there is concern of vaginal dryness, has discuss hormonal supplementaion Follow up as needed -     Urinalysis w microscopic + reflex cultur  Further disposition pending results of labs. Discussed med's effects and SE's.   Over 15 minutes of exam, counseling, chart review, and critical decision making was performed.   Future Appointments  Date Time Provider Department Center  03/23/2018  2:30 PM Levert Feinstein, MD [facility] None  04/18/2018  2:30 PM Judd Gaudier, NP [facility] None  07/27/2018  3:00 PM Lucky Cowboy, MD [facility] None    ------------------------------------------------------------------------------------------------------------------  HPI BP 122/80   Pulse (!) 101   Temp (!) 97.3 F (36.3 C)   Ht 5' 8.5" (1.74 m)   Wt 162 lb (73.5 kg)   SpO2 92%   BMI 24.27 kg/m   57 y.o.female presents for requests to recheck urine. She was recently seen on 01/03/2018 c/o UTI symptoms for several days and was prescribed cipro 500 mg BID x 7 days. Culture showed staph haemolyticus. She completed cipro without issue.   She apparently contacted Dr. Oneta Rack recently for recurrent symptoms and was given another course of cipro, has 1 tab left, was supposed to follow up for UA/culture recheck but had a migraine and missed that appointment.   Today she denies dysuria, fever/chills, nausea, abdominal pain, hematuria, vaginal discharge.   She has had 2 UTIs this year. Prior to this she reports 1 previous UTI.   She has hx of bladder sling with mesh and has done well with this.   She is recently established with urology.  Lab Results  Component Value Date   GFRNONAA 67 01/09/2018    Past Medical History:   Diagnosis Date  . Anemia   . Anxiety   . Asthma   . Chronic heartburn   . Depression   . Elevated cholesterol    on Crestor  . Headache(784.0)   . Hypertension   . Hypothyroidism    S/P thyroidectomy  . Migraine   . Occlusion and stenosis of carotid artery with cerebral infarction   . Spasm of muscle   . Unspecified cerebral artery occlusion with cerebral infarction   . Vitamin D deficiency      Allergies  Allergen Reactions  . Penicillins Anaphylaxis and Hives  . Imitrex [Sumatriptan]   . Other Swelling    kiwi  . Sulfa Antibiotics Hives  . Zomig [Zolmitriptan]     Current Outpatient Medications on File Prior to Visit  Medication Sig  . albuterol (PROVENTIL HFA) 108 (90 Base) MCG/ACT inhaler 1 to 2 inhalations 10-15 minutes apart every 4 hours if needed for asthma rescue  . ALPRAZolam (XANAX) 1 MG tablet TAKE 1/2 TO 1 TABLET 2 TO 3 X / DAY AND PLEASE TRY TO LIMIT TO 5 DAYS /WEEK TO AVOID ADDICTION (Patient taking differently: Take one tablet as needed)  . amphetamine-dextroamphetamine (ADDERALL) 10 MG tablet Take 1/2 to 1 tablet 1 or 2 x daily for ADD  . aspirin EC 81 MG tablet Take 81 mg by mouth daily.  . Botulinum Toxin Type A (BOTOX) 200 UNITS SOLR Inject as directed. Change to BOTOX A 100  units  . buPROPion (WELLBUTRIN XL) 300 MG 24 hr tablet TAKE 1 TABLET (300  MG TOTAL) BY MOUTH DAILY. WITH FOOD  . butalbital-acetaminophen-caffeine (FIORICET, ESGIC) 50-325-40 MG tablet Take 1 tablet by mouth daily as needed for headache. Do not refill in less than 30 days,  . cetirizine (ZYRTEC) 10 MG tablet Take 10 mg by mouth daily.  . ciprofloxacin (CIPRO) 500 MG tablet TAKE 1 TABLET BY MOUTH TWICE A DAY FOR 7 DAYS  . fenofibrate micronized (LOFIBRA) 134 MG capsule TAKE 1 CAPSULE EVERY DAY  . levothyroxine (SYNTHROID, LEVOTHROID) 100 MCG tablet TAKE 1 TABLET BY MOUTH EVERY DAY  . magnesium oxide (MAG-OX) 400 MG tablet Take 400 mg by mouth 2 (two) times daily.   . montelukast  (SINGULAIR) 10 MG tablet TAKE 1 TABLET BY MOUTH EVERYDAY AT BEDTIME  . Multiple Vitamin (MULTIVITAMIN WITH MINERALS) TABS tablet Take 1 tablet by mouth daily.  . nortriptyline (PAMELOR) 25 MG capsule Take 2 capsules (50 mg total) by mouth at bedtime.  Marland Kitchen omeprazole (PRILOSEC) 40 MG capsule TAKE 1 CAPSULE (40 MG TOTAL) BY MOUTH DAILY. FOR ACID INDIGESTION AND REFLUX  . prochlorperazine (COMPAZINE) 5 MG tablet TAKE 1 TABLET BY MOUTH 3 TIMES A DAY AS NEEDED FOR NAUSEA  . Riboflavin 100 MG TABS Take 100 mg by mouth. bid  . rosuvastatin (CRESTOR) 40 MG tablet   . tiZANidine (ZANAFLEX) 2 MG tablet TAKE 1 TABLET (2 MG TOTAL) BY MOUTH EVERY 6 (SIX) HOURS AS NEEDED FOR MUSCLE SPASMS.  Marland Kitchen topiramate (TOPAMAX) 100 MG tablet Take 1 tablet (100 mg total) by mouth 2 (two) times daily.  . traMADol (ULTRAM) 50 MG tablet Take 1 tablet (50 mg total) by mouth every 6 (six) hours as needed.  . verapamil (CALAN-SR) 240 MG CR tablet Take 1 tablet every morning on a full stomach for BP & Migraine prevention  . vitamin B-12 (CYANOCOBALAMIN) 1000 MCG tablet Take 1,000 mcg by mouth daily.  . Vitamin D, Ergocalciferol, (DRISDOL) 50000 units CAPS capsule TAKE 1 CAPSULE DAILY   No current facility-administered medications on file prior to visit.     ROS: all negative except above.   Physical Exam:  BP 122/80   Pulse (!) 101   Temp (!) 97.3 F (36.3 C)   Ht 5' 8.5" (1.74 m)   Wt 162 lb (73.5 kg)   SpO2 92%   BMI 24.27 kg/m   General Appearance: Well nourished, in no apparent distress. Eyes: conjunctiva no swelling or erythema ENT/Mouth: Hearing normal.  Neck: Supple Respiratory: Respiratory effort normal, BS equal bilaterally without rales, rhonchi, wheezing or stridor.  Cardio: RRR with no MRGs.  Abdomen: Soft, + BS.  Non tender, no guarding, rebound, hernias, masses. Lymphatics: Non tender without lymphadenopathy.  Musculoskeletal: normal gait.  Skin: Warm, dry without rashes, lesions, ecchymosis.  Neuro:  Normal muscle tone, no cerebellar symptoms.  Psych: Awake and oriented X 3, normal affect, Insight and Judgment appropriate.     Dan Maker, NP 2:29 PM [facility]

## 2018-02-02 ENCOUNTER — Encounter: Payer: Self-pay | Admitting: Adult Health

## 2018-02-02 ENCOUNTER — Ambulatory Visit (INDEPENDENT_AMBULATORY_CARE_PROVIDER_SITE_OTHER): Payer: BLUE CROSS/BLUE SHIELD | Admitting: Adult Health

## 2018-02-02 ENCOUNTER — Other Ambulatory Visit: Payer: Self-pay | Admitting: Neurology

## 2018-02-02 VITALS — BP 122/80 | HR 101 | Temp 97.3°F | Ht 68.5 in | Wt 162.0 lb

## 2018-02-02 DIAGNOSIS — N390 Urinary tract infection, site not specified: Secondary | ICD-10-CM | POA: Diagnosis not present

## 2018-02-02 NOTE — Patient Instructions (Signed)

## 2018-02-04 LAB — URINE CULTURE
MICRO NUMBER:: 91347621
SPECIMEN QUALITY:: ADEQUATE

## 2018-02-04 LAB — URINALYSIS W MICROSCOPIC + REFLEX CULTURE
Bacteria, UA: NONE SEEN /HPF
Bilirubin Urine: NEGATIVE
Glucose, UA: NEGATIVE
Hgb urine dipstick: NEGATIVE
Hyaline Cast: NONE SEEN /LPF
Ketones, ur: NEGATIVE
Nitrites, Initial: NEGATIVE
Protein, ur: NEGATIVE
RBC / HPF: NONE SEEN /HPF (ref 0–2)
Specific Gravity, Urine: 1.012 (ref 1.001–1.03)
WBC, UA: NONE SEEN /HPF (ref 0–5)
pH: 7 (ref 5.0–8.0)

## 2018-02-04 LAB — CULTURE INDICATED

## 2018-02-14 ENCOUNTER — Telehealth: Payer: Self-pay | Admitting: Neurology

## 2018-02-14 NOTE — Telephone Encounter (Signed)
Botox letter regarding Specialty Pharmacy mailed to patient °

## 2018-03-07 ENCOUNTER — Telehealth: Payer: Self-pay | Admitting: Physician Assistant

## 2018-03-07 DIAGNOSIS — F988 Other specified behavioral and emotional disorders with onset usually occurring in childhood and adolescence: Secondary | ICD-10-CM

## 2018-03-07 MED ORDER — AMPHETAMINE-DEXTROAMPHETAMINE 10 MG PO TABS: ORAL_TABLET | ORAL | 0 refills | Status: DC

## 2018-03-07 NOTE — Telephone Encounter (Signed)
-----   Message from Gregery NaAngela D Duff, CMA sent at 03/06/2018  1:48 PM EST ----- Regarding: refill PER PT/YELLOW NOTE:  Refill on ADDERALL Please & thank you!  Pharmacy: [facility] [place]

## 2018-03-16 ENCOUNTER — Other Ambulatory Visit: Payer: Self-pay | Admitting: Neurology

## 2018-03-23 ENCOUNTER — Ambulatory Visit (INDEPENDENT_AMBULATORY_CARE_PROVIDER_SITE_OTHER): Payer: BLUE CROSS/BLUE SHIELD | Admitting: Neurology

## 2018-03-23 ENCOUNTER — Encounter: Payer: Self-pay | Admitting: Neurology

## 2018-03-23 VITALS — BP 139/82 | HR 96 | Ht 68.5 in | Wt 164.5 lb

## 2018-03-23 DIAGNOSIS — I69954 Hemiplegia and hemiparesis following unspecified cerebrovascular disease affecting left non-dominant side: Secondary | ICD-10-CM | POA: Diagnosis not present

## 2018-03-23 DIAGNOSIS — G43709 Chronic migraine without aura, not intractable, without status migrainosus: Secondary | ICD-10-CM

## 2018-03-23 DIAGNOSIS — IMO0002 Reserved for concepts with insufficient information to code with codable children: Secondary | ICD-10-CM

## 2018-03-23 MED ORDER — ONABOTULINUMTOXINA 100 UNITS IJ SOLR
400.0000 [IU] | Freq: Once | INTRAMUSCULAR | Status: AC
  Administered 2018-03-23: 400 [IU] via INTRAMUSCULAR

## 2018-03-23 MED ORDER — DICLOFENAC POTASSIUM(MIGRAINE) 50 MG PO PACK: PACK | ORAL | 11 refills | Status: DC

## 2018-03-23 NOTE — Progress Notes (Signed)
Chief Complaint  Patient presents with  . Spastic Hemiplegia/Migraines    Botox 100 units x 4 vials - office supply      PATIENT: Ruth Gregory DOB: May 28, 1960  Chief Complaint  Patient presents with  . Spastic Hemiplegia/Migraines    Botox 100 units x 4 vials - office supply     HISTORICAL  Ruth Gregory is a 57 year old right-handed female  History of right internal carotid section with spastic left hemiparesis, Botox injection every 3 months  She has history of right internal carotid artery dissection following an episode of vomiting from migraine headache in mid June 2009. She was given IV TPA and found to have right MCA occlusion. She underwent emergent right carotid artery stent with distal endovascular recanalization of the middle cerebral artery.  She had small hemorrhagic transformation of the right basal ganglia with mild left hemiparesis, but has done well since then and has been independent.  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  She has been receiving BOTOX injection since 01/2009 every 3 months, to her left upper extremity and also left lower extremity, responded well.  Repeat US carotid were normal in October 2013  Chronic migraine headaches: She had long-standing history of chronic migraine, average 2-4 headaches each months, she has increased frequency headaches around fall, and spring, she is now taking Topamax ER 150 mg every night as preventive medication, magnesium  oxide 400 mg, riboflavin 100 mg twice a day,  She is not candidate for triptan because previous history of stroke, Cambia works well for her, she her insurance only allow her to have 9 tablets each months. Occasionally she take Fioricet, hydrocodone as needed, even prednisone 20 mg as needed as rescue therapy  Over the past few weeks, she been having to 3 headaches each week, used out her cambia, she also complains of neck muscle tightness, receiving chiropractor sometimes   Last Botox injection to spastic left upper extremity works well, but she noticed left hand muscle weakness.  UPDATE Mar 26 2015: She responded very well to previous Botox injection in September 2016, but noticed increased left hand weakness, gradually recovered over the past few weeks  Update July 15 2015: She responded very well to previous Botox injection in December 20 eighth 2016, noticed returning of left finger flexion spasticity, left ankle plantarflexion, want today's injection emphasize on above abnormal posturing.  She still has frequent migraine headaches, 1-2 headache each week, some headache was protracted, lasting 1-2 days, require multiple dose of cambia and Fioricet  Update November 20 2015: Patient called in September 09 2015 "She also said the left hand is very weak- 12,3 digits > 4,5. She said last OV she rec'd botox in wrist. She sts she may not be able to get botox next month if she is not feeling better"  She has a lot of pain on her left leg, mainly her left ankle from knee down, throbbing, tooth ache, she noticed more tendency of left ankle plantar flexion.  She continue have  intermittent migraine headaches, more at the left retro-orbital  Area  UPDATE Dec 6th 2017: She responded very well to previous Botox injection, complains frequent migraine headaches, once a week, lasting 1-2 days, partial response to combination of cambia, Fioricet, Compazine, she is taking Topamax ER 150 mg as preventive  medications  UPDATE June 30 2016: She had her first Botox injection for migraine prevention in December 2017, with only used 75 units she reported dramatic improvement, she only has migraines about once a week, which is milder, better controlled by the medication, instead of 2-3 times more severe headaches, at the end of the benefit, she noticed recurrent of her headaches, she also noticed mild left shoulder stiffness, left ankle turned in,  She noticed worsening right knee pain, recently had right knee injection.  Update November 04 2016: She responded well to previous injection, Botox injection to bilateral frontal temporal region has helped her headache,  UPDATE Mar 11 2017: She responded well to previous injection in August 2018.  Reported increased migraine headache over the past few weeks,  UPDATE Aug 08 2017: She is having migraine almost every week, lasting for 2 days, she is hesitate   UPDATE Sept 30 2019: She missed her appointment in August, she was given toradol and pheneran by pcp recently for protracted migraine headache, which did help her headache, but less effective than Depacon  She continue have 1 or 2 migraine headaches 1 to 2 weeks, also noticed increased left ankle spasticity, unsteady gait  UPDATE Mar 23 2018: She still has frequent headaches, want to explore the possibility of CGRP, responded very well to previous injection,  Review of system: Pertinent as above   ALLERGIES: Allergies  Allergen Reactions  . Penicillins Anaphylaxis and Hives  . Imitrex [Sumatriptan]   . Other Swelling    kiwi  . Sulfa Antibiotics Hives  . Zomig [Zolmitriptan]     HOME MEDICATIONS: Current Outpatient Medications  Medication Sig Dispense Refill  . albuterol (PROVENTIL HFA) 108 (90 Base) MCG/ACT inhaler 1 to 2 inhalations 10-15 minutes apart every 4 hours if needed for asthma rescue 48 g 3  . ALPRAZolam (XANAX) 1 MG tablet TAKE 1/2 TO 1 TABLET 2 TO 3 X / DAY AND PLEASE TRY TO  LIMIT TO 5 DAYS /WEEK TO AVOID ADDICTION (Patient taking differently: Take one tablet as needed) 90 tablet 0  . amphetamine-dextroamphetamine (ADDERALL) 10 MG tablet Take 1/2 to 1 tablet 1 or 2 x daily for ADD 60 tablet 0  . aspirin EC 81 MG tablet Take 81 mg by mouth daily.    . Botulinum Toxin Type A (BOTOX) 200 UNITS SOLR Inject as directed. Change to BOTOX A 100  units    . buPROPion (WELLBUTRIN XL) 300 MG 24 hr tablet TAKE 1 TABLET (300 MG TOTAL) BY MOUTH DAILY. WITH FOOD 90 tablet 3  . butalbital-acetaminophen-caffeine (FIORICET, ESGIC) 50-325-40 MG tablet Take 1 tablet by mouth daily as needed for headache. Do not refill in less than 30 days, 12 tablet 5  . cetirizine (ZYRTEC) 10 MG tablet Take 10 mg by mouth daily.    . ciprofloxacin (CIPRO) 500 MG tablet TAKE 1 TABLET BY MOUTH TWICE A DAY FOR 7 DAYS 14 tablet 0  . Diclofenac Potassium (CAMBIA) 50 MG PACK TAKE 1 PACKET AT ONSET OF MIGRAINE. CAN REPEAT IN 2 HR ONCE IF NEEDED. NO MORE THAN 2 DOSES IN 24HR. 8 each 11  . fenofibrate micronized (LOFIBRA) 134 MG capsule TAKE 1 CAPSULE EVERY DAY  90 capsule 3  . levothyroxine (SYNTHROID, LEVOTHROID) 100 MCG tablet TAKE 1 TABLET BY MOUTH EVERY DAY 90 tablet 1  . magnesium oxide (MAG-OX) 400 MG tablet Take 400 mg by mouth 2 (two) times daily.     . montelukast (SINGULAIR) 10 MG tablet TAKE 1 TABLET BY MOUTH EVERYDAY AT BEDTIME 90 tablet 1  . Multiple Vitamin (MULTIVITAMIN WITH MINERALS) TABS tablet Take 1 tablet by mouth daily.    . nortriptyline (PAMELOR) 25 MG capsule Take 2 capsules (50 mg total) by mouth at bedtime. 180 capsule 3  . omeprazole (PRILOSEC) 40 MG capsule TAKE 1 CAPSULE (40 MG TOTAL) BY MOUTH DAILY. FOR ACID INDIGESTION AND REFLUX 30 capsule 5  . prochlorperazine (COMPAZINE) 5 MG tablet TAKE 1 TABLET BY MOUTH 3 TIMES A DAY AS NEEDED FOR NAUSEA 50 tablet 1  . Riboflavin 100 MG TABS Take 100 mg by mouth. bid    . rosuvastatin (CRESTOR) 40 MG tablet     . tiZANidine (ZANAFLEX) 2 MG  tablet TAKE 1 TABLET (2 MG TOTAL) BY MOUTH EVERY 6 (SIX) HOURS AS NEEDED FOR MUSCLE SPASMS. 30 tablet 6  . topiramate (TOPAMAX) 100 MG tablet Take 1 tablet (100 mg total) by mouth 2 (two) times daily. 90 tablet 4  . verapamil (CALAN-SR) 240 MG CR tablet Take 1 tablet every morning on a full stomach for BP & Migraine prevention 90 tablet 3  . vitamin B-12 (CYANOCOBALAMIN) 1000 MCG tablet Take 1,000 mcg by mouth daily.    . Vitamin D, Ergocalciferol, (DRISDOL) 50000 units CAPS capsule TAKE 1 CAPSULE DAILY 30 capsule 1   No current facility-administered medications for this visit.     PAST MEDICAL HISTORY: Past Medical History:  Diagnosis Date  . Anemia   . Anxiety   . Asthma   . Chronic heartburn   . Depression   . Elevated cholesterol    on Crestor  . Headache(784.0)   . Hypertension   . Hypothyroidism    S/P thyroidectomy  . Migraine   . Occlusion and stenosis of carotid artery with cerebral infarction   . Spasm of muscle   . Unspecified cerebral artery occlusion with cerebral infarction   . Vitamin D deficiency     PAST SURGICAL HISTORY: Past Surgical History:  Procedure Laterality Date  . AUGMENTATION MAMMAPLASTY Bilateral 2000  . COSMETIC SURGERY    . CYSTOCELE REPAIR N/A 01/08/2013   Procedure: ANTERIOR REPAIR (CYSTOCELE);  Surgeon: Meriel Pica, MD;  Location: [facility];  Service: Gynecology;  Laterality: N/A;  . NASAL SINUS SURGERY    . VAGINAL HYSTERECTOMY      FAMILY HISTORY: Family History  Problem Relation Age of Onset  . Heart disease Mother   . Peptic Ulcer Disease Mother   . Heart disease Father   . Parkinson's disease Father     SOCIAL HISTORY:  Social History   Socioeconomic History  . Marital status: Married    Spouse name: Brett Canales  . Number of children: 2  . Years of education: college  . Highest education level: Not on file  Occupational History  . Occupation: DESIGNER    Employer: SELF  Social Needs  . Financial resource strain: Not  on file  . Food insecurity:    Worry: Not on file    Inability: Not on file  . Transportation needs:    Medical: Not on file    Non-medical: Not on file  Tobacco Use  . Smoking status: Never Smoker  . Smokeless  tobacco: Never Used  Substance and Sexual Activity  . Alcohol use: Yes    Comment: occassionally drinks a glass of wine  . Drug use: No  . Sexual activity: Not on file  Lifestyle  . Physical activity:    Days per week: Not on file    Minutes per session: Not on file  . Stress: Not on file  Relationships  . Social connections:    Talks on phone: Not on file    Gets together: Not on file    Attends religious service: Not on file    Active member of club or organization: Not on file    Attends meetings of clubs or organizations: Not on file    Relationship status: Not on file  . Intimate partner violence:    Fear of current or ex partner: Not on file    Emotionally abused: Not on file    Physically abused: Not on file    Forced sexual activity: Not on file  Other Topics Concern  . 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 Patient is married and lives with her husband. Patient has college education.   Right handed.   Caffeine- two cups daily.     PHYSICAL EXAM   Vitals:   03/23/18 1430  BP: 139/82  Pulse: 96  Weight: 164 lb 8 oz (74.6 kg)  Height: 5' 8.5" (1.74 m)    Not recorded      Body mass index is 24.65 kg/m.  PHYSICAL EXAMNIATION:  Gen: NAD, conversant, well nourised, obese, well groomed                     Cardiovascular: Regular rate rhythm, no peripheral edema, warm, nontender. Eyes: Conjunctivae clear without exudates or hemorrhage Neck: Supple, no carotid bruise. Pulmonary: Clear to auscultation bilaterally   NEUROLOGICAL EXAM:  MENTAL STATUS: Speech:    Speech is normal; fluent and spontaneous with normal comprehension.  Cognition:     Orientation to time, place and person      Normal recent and remote memory     Normal Attention span and concentration     Normal Language, naming, repeating,spontaneous speech     Fund of knowledge   CRANIAL NERVES: CN II: Visual fields are full to confrontation. Fundoscopic exam is normal with sharp discs and no vascular changes. Pupils are round equal and briskly reactive to light. CN III, IV, VI: extraocular movement are normal. No ptosis. CN V: Facial sensation is intact to pinprick in all 3 divisions bilaterally. Corneal responses are intact.  CN VII: Face is symmetric with normal eye closure and smile. CN VIII: Hearing is normal to rubbing fingers CN IX, X: Palate elevates symmetrically. Phonation is normal. CN XI: Head turning and shoulder shrug are intact CN XII: Tongue is midline with normal movements and no atrophy.  MOTOR: Mild spastic left hemiparesis, left upper extremity proximal and distal 4/5, mild left hip flexion and left ankle dorsiflexion weakness  REFLEXES: Reflexes are 2+ and symmetric at the biceps, triceps, knees, and ankles. Plantar responses are flexor.  SENSORY: Intact to light touch, pinprick, position sense, and vibration sense are intact in fingers and toes.  COORDINATION: Rapid alternating movements and fine finger movements are intact. There is no dysmetria on finger-to-nose and heel-knee-shin.    GAIT/STANCE: Mildly unsteady gait, checking her left foot across the floor  Romberg is absent.   DIAGNOSTIC DATA (LABS, IMAGING,  TESTING) - I reviewed patient records, labs, notes, testing and imaging myself where available.   ASSESSMENT AND PLAN  Ruth Gregory is a 57 y.o. female   Chronic migraine headaches  Continue preventive medications, extended release topiramate 150 mg every day  Cambia, Fioricet, Compazine as needed,+ tizanidine prn  Continue magnesium oxide, riboflavin as preventive medications,  Add on nortriptyline 25 mg titrating to 50 every night as migraine prevention     Spastic left hemiparesis following right internal carotid artery dissection, right MCA stroke  Electrical stimulation guided Botox injection, used 400 units of Botox A total   Left pronator teres 25 units Left flexor digitorum profoundi 25 units Left brachialis 50 units Left pectoralis major 50 units Left latissimus dorsi 50 units   Left tibialis posterior 50 units Left flexor digitorum longus 25 unitsx2=50 units  I also injected  100 units for chronic migraine headache  Left corrugate 5 units Right corrugate 5 units Prosperous 5 units Frontalis 20 Temporalis 40 units Occipital 25 units   Also provide information on CGRP antagonist.  Levert FeinsteinYijun Amaro Mangold, M.D. Ph.D.  [facility] [address], [address] [place], [place] [address] Ph: [phone] Fax: [phone]  CC: Referring Provider

## 2018-03-23 NOTE — Progress Notes (Signed)
**  Botox 100 units x 4 vials, [facility] [id number], Lot [id number], Exp 07/2020, office supply.//mck,rn**

## 2018-04-12 ENCOUNTER — Other Ambulatory Visit: Payer: Self-pay | Admitting: Neurology

## 2018-04-14 ENCOUNTER — Other Ambulatory Visit: Payer: Self-pay | Admitting: Internal Medicine

## 2018-04-17 NOTE — Progress Notes (Deleted)
FOLLOW UP  Assessment and Plan:   Hx CVA with residual deficiencies Continue follow up with neurology Control blood pressure, cholesterol, glucose, increase exercise.   BP Well controlled  Monitor blood pressure at home; patient to call if consistently greater than 130/80 Continue DASH diet.   Reminder to go to the ER if any CP, SOB, nausea, dizziness, severe HA, changes vision/speech, left arm numbness and tingling and jaw pain.  Cholesterol Currently above goal; has been taking crestor and fenofibrate more consistently *** Continue low cholesterol diet and exercise.  Check lipid panel.   Other abnormal glucose Recent A1Cs at goal Discussed diet/exercise, weight management  Defer A1C; check CMP/GFR  Hypothyroidism continue medications the same pending lab results; she did forget to take her medication this AM reminded to take on an empty stomach 30-4mins before food.  check TSH level  BMi 24 Continue to recommend diet heavy in fruits and veggies and low in animal meats, cheeses, and dairy products, appropriate calorie intake Discuss exercise recommendations routinely Continue to monitor weight at each visit  Vitamin D Def At goal at last visit; continue supplementation to maintain goal of 70-100 Defer Vit D level  ADD Continue medications Helps with focus, no AE's. The patient was counseled on the addictive nature of the medication and was encouraged to take drug holidays when not needed.   Anxiety Well managed by current regimen; continue medications; using benzo very rarely Stress management techniques discussed, increase water, good sleep hygiene discussed, increase exercise, and increase veggies.    Continue diet and meds as discussed. Further disposition pending results of labs. Discussed med's effects and SE's.   Over 30 minutes of exam, counseling, chart review, and critical decision making was performed.   Future Appointments  Date Time Provider  Department Center  04/18/2018  2:30 PM Judd Gaudier, NP [facility] None  04/21/2018  1:00 PM [facility] [place] [facility]  06/21/2018  1:30 PM Levert Feinstein, MD [facility] None  07/27/2018  3:00 PM Lucky Cowboy, MD [facility] None    ----------------------------------------------------------------------------------------------------------------------  HPI 58 y.o. female  presents for 3 month follow up on hypertension, cholesterol, glucose management, hypothyroid, weight and vitamin D deficiency.   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 Patient is followed by Dr Terrace Arabia for Botox injections as well as for her chronic Migraines.   She is on pamelor and xanax for anxiety; she reports she is doing very well recently, has weaned herself off of regular xanax use and now uses very rarely and doing better.   BMI is There is no height or weight on file to calculate BMI., she has been working on diet and exercise. Wt Readings from Last 3 Encounters:  03/23/18 164 lb 8 oz (74.6 kg)  02/02/18 162 lb (73.5 kg)  01/09/18 162 lb 8 oz (73.7 kg)   Her blood pressure has been controlled at home, today their BP is    She does workout. She denies chest pain, shortness of breath, dizziness.   She is on cholesterol medication (she is on rosuvastatin 40 mg daily and fenofibrate daily) and denies myalgias. Her cholesterol is not at goal. The cholesterol last visit was:   Lab Results  Component Value Date   CHOL 192 01/09/2018   HDL 59 01/09/2018   LDLCALC 101 (  H) 01/09/2018   TRIG 201 (H) 01/09/2018   CHOLHDL 3.3 01/09/2018    She has been working on diet and exercise for glucose management, and denies foot ulcerations, increased appetite, nausea, paresthesia of the feet,  polydipsia, polyuria, visual disturbances, vomiting and weight loss. Last A1C in the office was:  Lab Results  Component Value Date   HGBA1C 5.6 01/09/2018   She is on thyroid medication. Her medication was not changed last visit.   Lab Results  Component Value Date   TSH 0.91 01/09/2018   Patient is on Vitamin D supplement.   Lab Results  Component Value Date   VD25OH 47 01/09/2018        Current Medications:  Current Outpatient Medications on File Prior to Visit  Medication Sig  . albuterol (PROVENTIL HFA) 108 (90 Base) MCG/ACT inhaler 1 to 2 inhalations 10-15 minutes apart every 4 hours if needed for asthma rescue  . ALPRAZolam (XANAX) 1 MG tablet TAKE 1/2 TO 1 TABLET 2 TO 3 X / DAY AND PLEASE TRY TO LIMIT TO 5 DAYS /WEEK TO AVOID ADDICTION (Patient taking differently: Take one tablet as needed)  . amphetamine-dextroamphetamine (ADDERALL) 10 MG tablet Take 1/2 to 1 tablet 1 or 2 x daily for ADD  . aspirin EC 81 MG tablet Take 81 mg by mouth daily.  . Botulinum Toxin Type A (BOTOX) 200 UNITS SOLR Inject as directed. Change to BOTOX A 100  units  . buPROPion (WELLBUTRIN XL) 300 MG 24 hr tablet TAKE 1 TABLET (300 MG TOTAL) BY MOUTH DAILY. WITH FOOD  . butalbital-acetaminophen-caffeine (FIORICET, ESGIC) 50-325-40 MG tablet Take 1 tablet by mouth daily as needed for headache. Do not refill in less than 30 days,  . cetirizine (ZYRTEC) 10 MG tablet Take 10 mg by mouth daily.  . ciprofloxacin (CIPRO) 500 MG tablet TAKE 1 TABLET BY MOUTH TWICE A DAY FOR 7 DAYS  . Diclofenac Potassium (CAMBIA) 50 MG PACK TAKE 1 PACKET AT ONSET OF MIGRAINE. CAN REPEAT IN 2 HR ONCE IF NEEDED. NO MORE THAN 2 DOSES IN 24HR.  . fenofibrate micronized (LOFIBRA) 134 MG capsule TAKE 1 CAPSULE EVERY DAY  . levothyroxine (SYNTHROID, LEVOTHROID) 100 MCG tablet TAKE 1 TABLET BY MOUTH EVERY DAY  . magnesium oxide (MAG-OX) 400 MG tablet Take 400 mg by mouth 2 (two) times daily.   . montelukast (SINGULAIR) 10 MG tablet  TAKE 1 TABLET BY MOUTH EVERYDAY AT BEDTIME  . Multiple Vitamin (MULTIVITAMIN WITH MINERALS) TABS tablet Take 1 tablet by mouth daily.  . nortriptyline (PAMELOR) 25 MG capsule Take 2 capsules (50 mg total) by mouth at bedtime.  Marland Kitchen. omeprazole (PRILOSEC) 40 MG capsule TAKE 1 CAPSULE (40 MG TOTAL) BY MOUTH DAILY. FOR ACID INDIGESTION AND REFLUX  . prochlorperazine (COMPAZINE) 5 MG tablet TAKE 1 TABLET BY MOUTH 3 TIMES A DAY AS NEEDED FOR NAUSEA  . Riboflavin 100 MG TABS Take 100 mg by mouth. bid  . rosuvastatin (CRESTOR) 40 MG tablet   . tiZANidine (ZANAFLEX) 2 MG tablet TAKE 1 TABLET (2 MG TOTAL) BY MOUTH EVERY 6 (SIX) HOURS AS NEEDED FOR MUSCLE SPASMS.  Marland Kitchen. topiramate (TOPAMAX) 100 MG tablet Take 1.5 tablets (150 mg total) by mouth daily.  . verapamil (CALAN-SR) 240 MG CR tablet Take 1 tablet every morning on a full stomach for BP & Migraine prevention  . vitamin B-12 (CYANOCOBALAMIN) 1000 MCG tablet Take 1,000 mcg by mouth daily.  . Vitamin D, Ergocalciferol, (DRISDOL) 1.25 MG (50000 UT) CAPS capsule  TAKE 1 CAPSULE EVERY DAY   No current facility-administered medications on file prior to visit.      Allergies:  Allergies  Allergen Reactions  . Penicillins Anaphylaxis and Hives  . Imitrex [Sumatriptan]   . Other Swelling    kiwi  . Sulfa Antibiotics Hives  . Zomig [Zolmitriptan]      Medical History:  Past Medical History:  Diagnosis Date  . Anemia   . Anxiety   . Asthma   . Chronic heartburn   . Depression   . Elevated cholesterol    on Crestor  . Headache(784.0)   . Hypertension   . Hypothyroidism    S/P thyroidectomy  . Migraine   . Occlusion and stenosis of carotid artery with cerebral infarction   . Spasm of muscle   . Unspecified cerebral artery occlusion with cerebral infarction   . Vitamin D deficiency    Family history- Reviewed and unchanged Social history- Reviewed and unchanged   Review of Systems:  Review of Systems  Constitutional: Negative for  malaise/fatigue and weight loss.  HENT: Negative for hearing loss and tinnitus.   Eyes: Negative for blurred vision and double vision.  Respiratory: Negative for cough, shortness of breath and wheezing.   Cardiovascular: Negative for chest pain, palpitations, orthopnea, claudication and leg swelling.  Gastrointestinal: Negative for abdominal pain, blood in stool, constipation, diarrhea, heartburn, melena, nausea and vomiting.  Genitourinary: Negative.   Musculoskeletal: Negative for joint pain and myalgias.  Skin: Negative for rash.  Neurological: Positive for headaches (migraines). Negative for dizziness, tingling, sensory change and weakness.  Endo/Heme/Allergies: Negative for polydipsia.  Psychiatric/Behavioral: Negative.   All other systems reviewed and are negative.   Physical Exam: There were no vitals taken for this visit. Wt Readings from Last 3 Encounters:  03/23/18 164 lb 8 oz (74.6 kg)  02/02/18 162 lb (73.5 kg)  01/09/18 162 lb 8 oz (73.7 kg)   General Appearance: Well nourished, in no apparent distress. Eyes: PERRLA, EOMs, conjunctiva no swelling or erythema Sinuses: No Frontal/maxillary tenderness ENT/Mouth: Ext aud canals clear, TMs without erythema, bulging. No erythema, swelling, or exudate on post pharynx.  Tonsils not swollen or erythematous. Hearing normal.  Neck: Supple, thyroid normal.  Respiratory: Respiratory effort normal, BS equal bilaterally without rales, rhonchi, wheezing or stridor.  Cardio: RRR with no MRGs. Brisk peripheral pulses without edema.  Abdomen: Soft, + BS.  Non tender, no guarding, rebound, hernias, masses. Lymphatics: Non tender without lymphadenopathy.  Musculoskeletal: Full ROM, 5/5 strength, Normal gait *** Skin: Warm, dry without rashes, lesions, ecchymosis.  Neuro: Cranial nerves intact. No cerebellar symptoms.  Psych: Awake and oriented X 3, normal affect, Insight and Judgment appropriate.    Dan MakerAshley C Tnya Ades, NP 9:34  AM [facility]

## 2018-04-18 ENCOUNTER — Ambulatory Visit: Payer: Self-pay | Admitting: Adult Health

## 2018-04-21 ENCOUNTER — Other Ambulatory Visit: Payer: Self-pay | Admitting: Adult Health

## 2018-04-21 ENCOUNTER — Ambulatory Visit
Admission: RE | Admit: 2018-04-21 | Discharge: 2018-04-21 | Disposition: A | Discharge status: Home / Self Care | Payer: BLUE CROSS/BLUE SHIELD | Source: Ambulatory Visit | Attending: Nephrology | Admitting: Nephrology

## 2018-04-21 DIAGNOSIS — N183 Chronic kidney disease, stage 3 unspecified: Secondary | ICD-10-CM

## 2018-04-21 DIAGNOSIS — I129 Hypertensive chronic kidney disease with stage 1 through stage 4 chronic kidney disease, or unspecified chronic kidney disease: Secondary | ICD-10-CM | POA: Diagnosis not present

## 2018-04-24 NOTE — Patient Instructions (Addendum)
Here are some alternitives to use for your allergies.  Zyrtec / Cetirizine Take 89m by mouth May cause drowsiness, take nightly Be sure to drink plenty of water If this is not effective, try Xyzal or Allegra  Xyzal / Levocetirazine  Take 545mby mouth May cause drowsiness, take nightly Be sure to drink plenty of water If this is not effective try Allegra or Zyrtec  Allegra / fexofenadine Take 18048my mouth If this is not effective try Zyrtec or Xyzal      Continue your Singular daily.  You can switch that to Claritin if you feel this is not working for you.        Preventive Care for Adults  A healthy lifestyle and preventive care can promote health and wellness. Preventive health guidelines for women include the following key practices.  A routine yearly physical is a good way to check with your health care provider about your health and preventive screening. It is a chance to share any concerns and updates on your health and to receive a thorough exam.  Visit your dentist for a routine exam and preventive care every 6 months. Brush your teeth twice a day and floss once a day. Good oral hygiene prevents tooth decay and gum disease.  The frequency of eye exams is based on your age, health, family medical history, use of contact lenses, and other factors. Follow your health care provider's recommendations for frequency of eye exams.  Eat a healthy diet. Foods like vegetables, fruits, whole grains, low-fat dairy products, and lean protein foods contain the nutrients you need without too many calories. Decrease your intake of foods high in solid fats, added sugars, and salt. Eat the right amount of calories for you. Get information about a proper diet from your health care provider, if necessary.  Regular physical exercise is one of the most important things you can do for your health. Most adults should get at least 150 minutes of moderate-intensity exercise (any activity  that increases your heart rate and causes you to sweat) each week. In addition, most adults need muscle-strengthening exercises on 2 or more days a week.  Maintain a healthy weight. The body mass index (BMI) is a screening tool to identify possible weight problems. It provides an estimate of body fat based on height and weight. Your health care provider can find your BMI and can help you achieve or maintain a healthy weight. For adults 20 years and older:  A BMI below 18.5 is considered underweight.  A BMI of 18.5 to 24.9 is normal.  A BMI of 25 to 29.9 is considered overweight.  A BMI of 30 and above is considered obese.  Maintain normal blood lipids and cholesterol levels by exercising and minimizing your intake of saturated fat. Eat a balanced diet with plenty of fruit and vegetables. Blood tests for lipids and cholesterol should begin at age 22 42d be repeated every 5 years. If your lipid or cholesterol levels are high, you are over 50, or you are at high risk for heart disease, you may need your cholesterol levels checked more frequently. Ongoing high lipid and cholesterol levels should be treated with medicines if diet and exercise are not working.  If you smoke, find out from your health care provider how to quit. If you do not use tobacco, do not start.  Lung cancer screening is recommended for adults aged 55-66-80ars who are at high risk for developing lung cancer because of a history  of smoking. A yearly low-dose CT scan of the lungs is recommended for people who have at least a 30-pack-year history of smoking and are a current smoker or have quit within the past 15 years. A pack year of smoking is smoking an average of 1 pack of cigarettes a day for 1 year (for example: 1 pack a day for 30 years or 2 packs a day for 15 years). Yearly screening should continue until the smoker has stopped smoking for at least 15 years. Yearly screening should be stopped for people who develop a health  problem that would prevent them from having lung cancer treatment.  High blood pressure causes heart disease and increases the risk of stroke. Your blood pressure should be checked at least every 1 to 2 years. Ongoing high blood pressure should be treated with medicines if weight loss and exercise do not work.  If you are 47-44 years old, ask your health care provider if you should take aspirin to prevent strokes.  Diabetes screening involves taking a blood sample to check your fasting blood sugar level. This should be done once every 3 years, after age 3, if you are within normal weight and without risk factors for diabetes. Testing should be considered at a younger age or be carried out more frequently if you are overweight and have at least 1 risk factor for diabetes.  Breast cancer screening is essential preventive care for women. You should practice "breast self-awareness." This means understanding the normal appearance and feel of your breasts and may include breast self-examination. Any changes detected, no matter how small, should be reported to a health care provider. Women in their 50s and 30s should have a clinical breast exam (CBE) by a health care provider as part of a regular health exam every 1 to 3 years. After age 24, women should have a CBE every year. Starting at age 74, women should consider having a mammogram (breast X-ray test) every year. Women who have a family history of breast cancer should talk to their health care provider about genetic screening. Women at a high risk of breast cancer should talk to their health care providers about having an MRI and a mammogram every year.  Breast cancer gene (BRCA)-related cancer risk assessment is recommended for women who have family members with BRCA-related cancers. BRCA-related cancers include breast, ovarian, tubal, and peritoneal cancers. Having family members with these cancers may be associated with an increased risk for harmful  changes (mutations) in the breast cancer genes BRCA1 and BRCA2. Results of the assessment will determine the need for genetic counseling and BRCA1 and BRCA2 testing.  Routine pelvic exams to screen for cancer are no longer recommended for nonpregnant women who are considered low risk for cancer of the pelvic organs (ovaries, uterus, and vagina) and who do not have symptoms. Ask your health care provider if a screening pelvic exam is right for you.  If you have had past treatment for cervical cancer or a condition that could lead to cancer, you need Pap tests and screening for cancer for at least 20 years after your treatment. If Pap tests have been discontinued, your risk factors (such as having a new sexual partner) need to be reassessed to determine if screening should be resumed. Some women have medical problems that increase the chance of getting cervical cancer. In these cases, your health care provider may recommend more frequent screening and Pap tests.  Colorectal cancer can be detected and often prevented. Most  routine colorectal cancer screening begins at the age of 27 years and continues through age 42 years. However, your health care provider may recommend screening at an earlier age if you have risk factors for colon cancer. On a yearly basis, your health care provider may provide home test kits to check for hidden blood in the stool. Use of a small camera at the end of a tube, to directly examine the colon (sigmoidoscopy or colonoscopy), can detect the earliest forms of colorectal cancer. Talk to your health care provider about this at age 52, when routine screening begins.  Direct exam of the colon should be repeated every 5-10 years through age 64 years, unless early forms of pre-cancerous polyps or small growths are found.  Hepatitis C blood testing is recommended for all people born from [birth year] through 1965 and any individual with known risks for hepatitis C.  Pra  Osteoporosis is a  disease in which the bones lose minerals and strength with aging. This can result in serious bone fractures or breaks. The risk of osteoporosis can be identified using a bone density scan. Women ages 90 years and over and women at risk for fractures or osteoporosis should discuss screening with their health care providers. Ask your health care provider whether you should take a calcium supplement or vitamin D to reduce the rate of osteoporosis.  Menopause can be associated with physical symptoms and risks. Hormone replacement therapy is available to decrease symptoms and risks. You should talk to your health care provider about whether hormone replacement therapy is right for you.  Use sunscreen. Apply sunscreen liberally and repeatedly throughout the day. You should seek shade when your shadow is shorter than you. Protect yourself by wearing long sleeves, pants, a wide-brimmed hat, and sunglasses year round, whenever you are outdoors.  Once a month, do a whole body skin exam, using a mirror to look at the skin on your back. Tell your health care provider of new moles, moles that have irregular borders, moles that are larger than a pencil eraser, or moles that have changed in shape or color.  Stay current with required vaccines (immunizations).  Influenza vaccine. All adults should be immunized every year.  Tetanus, diphtheria, and acellular pertussis (Td, Tdap) vaccine. Pregnant women should receive 1 dose of Tdap vaccine during each pregnancy. The dose should be obtained regardless of the length of time since the last dose. Immunization is preferred during the 27th-36th week of gestation. An adult who has not previously received Tdap or who does not know her vaccine status should receive 1 dose of Tdap. This initial dose should be followed by tetanus and diphtheria toxoids (Td) booster doses every 10 years. Adults with an unknown or incomplete history of completing a 3-dose immunization series with  Td-containing vaccines should begin or complete a primary immunization series including a Tdap dose. Adults should receive a Td booster every 10 years.  Varicella vaccine. An adult without evidence of immunity to varicella should receive 2 doses or a second dose if she has previously received 1 dose. Pregnant females who do not have evidence of immunity should receive the first dose after pregnancy. This first dose should be obtained before leaving the health care facility. The second dose should be obtained 4-8 weeks after the first dose.  Human papillomavirus (HPV) vaccine. Females aged 13-26 years who have not received the vaccine previously should obtain the 3-dose series. The vaccine is not recommended for use in pregnant females. However, pregnancy testing  is not needed before receiving a dose. If a female is found to be pregnant after receiving a dose, no treatment is needed. In that case, the remaining doses should be delayed until after the pregnancy. Immunization is recommended for any person with an immunocompromised condition through the age of 75 years if she did not get any or all doses earlier. During the 3-dose series, the second dose should be obtained 4-8 weeks after the first dose. The third dose should be obtained 24 weeks after the first dose and 16 weeks after the second dose.  Zoster vaccine. One dose is recommended for adults aged 37 years or older unless certain conditions are present.  Measles, mumps, and rubella (MMR) vaccine. Adults born before [birth year] generally are considered immune to measles and mumps. Adults born in [birth year] or later should have 1 or more doses of MMR vaccine unless there is a contraindication to the vaccine or there is laboratory evidence of immunity to each of the three diseases. A routine second dose of MMR vaccine should be obtained at least 28 days after the first dose for students attending postsecondary schools, health care workers, or international travelers.  People who received inactivated measles vaccine or an unknown type of measles vaccine during 1963-1967 should receive 2 doses of MMR vaccine. People who received inactivated mumps vaccine or an unknown type of mumps vaccine before 1979 and are at high risk for mumps infection should consider immunization with 2 doses of MMR vaccine. For females of childbearing age, rubella immunity should be determined. If there is no evidence of immunity, females who are not pregnant should be vaccinated. If there is no evidence of immunity, females who are pregnant should delay immunization until after pregnancy. Unvaccinated health care workers born before 65 who lack laboratory evidence of measles, mumps, or rubella immunity or laboratory confirmation of disease should consider measles and mumps immunization with 2 doses of MMR vaccine or rubella immunization with 1 dose of MMR vaccine.  Pneumococcal 13-valent conjugate (PCV13) vaccine. When indicated, a person who is uncertain of her immunization history and has no record of immunization should receive the PCV13 vaccine. An adult aged 71 years or older who has certain medical conditions and has not been previously immunized should receive 1 dose of PCV13 vaccine. This PCV13 should be followed with a dose of pneumococcal polysaccharide (PPSV23) vaccine. The PPSV23 vaccine dose should be obtained at least 1 or more year(s) after the dose of PCV13 vaccine. An adult aged 9 years or older who has certain medical conditions and previously received 1 or more doses of PPSV23 vaccine should receive 1 dose of PCV13. The PCV13 vaccine dose should be obtained 1 or more years after the last PPSV23 vaccine dose.    Pneumococcal polysaccharide (PPSV23) vaccine. When PCV13 is also indicated, PCV13 should be obtained first. All adults aged 37 years and older should be immunized. An adult younger than age 20 years who has certain medical conditions should be immunized. Any person who  resides in a nursing home or long-term care facility should be immunized. An adult smoker should be immunized. People with an immunocompromised condition and certain other conditions should receive both PCV13 and PPSV23 vaccines. People with human immunodeficiency virus (HIV) infection should be immunized as soon as possible after diagnosis. Immunization during chemotherapy or radiation therapy should be avoided. Routine use of PPSV23 vaccine is not recommended for American Indians, [place] Natives, or people younger than 65 years unless there are medical conditions that  require PPSV23 vaccine. When indicated, people who have unknown immunization and have no record of immunization should receive PPSV23 vaccine. One-time revaccination 5 years after the first dose of PPSV23 is recommended for people aged 19-64 years who have chronic kidney failure, nephrotic syndrome, asplenia, or immunocompromised conditions. People who received 1-2 doses of PPSV23 before age 54 years should receive another dose of PPSV23 vaccine at age 31 years or later if at least 5 years have passed since the previous dose. Doses of PPSV23 are not needed for people immunized with PPSV23 at or after age 77 years.  Preventive Services / Frequency   Ages 67 to 75 years  Blood pressure check.  Lipid and cholesterol check.  Lung cancer screening. / Every year if you are aged 31-80 years and have a 30-pack-year history of smoking and currently smoke or have quit within the past 15 years. Yearly screening is stopped once you have quit smoking for at least 15 years or develop a health problem that would prevent you from having lung cancer treatment.  Clinical breast exam.** / Every year after age 33 years.   BRCA-related cancer risk assessment.** / For women who have family members with a BRCA-related cancer (breast, ovarian, tubal, or peritoneal cancers).  Mammogram.** / Every year beginning at age 91 years and continuing for as long as  you are in good health. Consult with your health care provider.  Pap test.** / Every 3 years starting at age 60 years through age 4 or 62 years with a history of 3 consecutive normal Pap tests.  HPV screening.** / Every 3 years from ages 46 years through ages 81 to 44 years with a history of 3 consecutive normal Pap tests.  Fecal occult blood test (FOBT) of stool. / Every year beginning at age 42 years and continuing until age 47 years. You may not need to do this test if you get a colonoscopy every 10 years.  Flexible sigmoidoscopy or colonoscopy.** / Every 5 years for a flexible sigmoidoscopy or every 10 years for a colonoscopy beginning at age 55 years and continuing until age 41 years.  Hepatitis C blood test.** / For all people born from [birth year] through 1965 and any individual with known risks for hepatitis C.  Skin self-exam. / Monthly.  Influenza vaccine. / Every year.  Tetanus, diphtheria, and acellular pertussis (Tdap/Td) vaccine.** / Consult your health care provider. Pregnant women should receive 1 dose of Tdap vaccine during each pregnancy. 1 dose of Td every 10 years.  Varicella vaccine.** / Consult your health care provider. Pregnant females who do not have evidence of immunity should receive the first dose after pregnancy.  Zoster vaccine.** / 1 dose for adults aged 57 years or older.  Pneumococcal 13-valent conjugate (PCV13) vaccine.** / Consult your health care provider.  Pneumococcal polysaccharide (PPSV23) vaccine.** / 1 to 2 doses if you smoke cigarettes or if you have certain conditions.  Meningococcal vaccine.** / Consult your health care provider.  Hepatitis A vaccine.** / Consult your health care provider.  Hepatitis B vaccine.** / Consult your health care provider. Screening for abdominal aortic aneurysm (AAA)  by ultrasound is recommended for people over 50 who have history of high blood pressure or who are current or former  smokers. ++++++++++++++++++ Recommend Adult Low Dose Aspirin or  coated  Aspirin 81 mg daily  To reduce risk of Colon Cancer 20 %,  Skin Cancer 26 % ,  Melanoma 46%  and  Pancreatic cancer 60% +++++++++++++++++++ Vitamin  D goal  is between 70-100.  Please make sure that you are taking your Vitamin D as directed.  It is very important as a natural anti-inflammatory  helping hair, skin, and nails, as well as reducing stroke and heart attack risk.  It helps your bones and helps with mood. It also decreases numerous cancer risks so please take it as directed.  Low Vit D is associated with a 200-300% higher risk for CANCER  and 200-300% higher risk for HEART   ATTACK  &  STROKE.   .....................................Marland Kitchen It is also associated with higher death rate at younger ages,  autoimmune diseases like Rheumatoid arthritis, Lupus, Multiple Sclerosis.    Also many other serious conditions, like depression, Alzheimer's Dementia, infertility, muscle aches, fatigue, fibromyalgia - just to name a few. ++++++++++++++++++ Recommend the book "The END of DIETING" by Dr Excell Seltzer  & the book "The END of DIABETES " by Dr Excell Seltzer At [email or web address] - get book & Audio CD's    Being diabetic has a  300% increased risk for heart attack, stroke, cancer, and alzheimer- type vascular dementia. It is very important that you work harder with diet by avoiding all foods that are white. Avoid white rice (brown & wild rice is OK), white potatoes (sweetpotatoes in moderation is OK), White bread or wheat bread or anything made out of white flour like bagels, donuts, rolls, buns, biscuits, cakes, pastries, cookies, pizza crust, and pasta (made from white flour & egg whites) - vegetarian pasta or spinach or wheat pasta is OK. Multigrain breads like Arnold's or [employer], or multigrain sandwich thins or flatbreads.  Diet, exercise and weight loss can reverse and cure diabetes in the early stages.  Diet,  exercise and weight loss is very important in the control and prevention of complications of diabetes which affects every system in your body, ie. Brain - dementia/stroke, eyes - glaucoma/blindness, heart - heart attack/heart failure, kidneys - dialysis, stomach - gastric paralysis, intestines - malabsorption, nerves - severe painful neuritis, circulation - gangrene & loss of a leg(s), and finally cancer and Alzheimers.    I recommend avoid fried & greasy foods,  sweets/candy, white rice (brown or wild rice or Quinoa is OK), white potatoes (sweet potatoes are OK) - anything made from white flour - bagels, doughnuts, rolls, buns, biscuits,white and wheat breads, pizza crust and traditional pasta made of white flour & egg white(vegetarian pasta or spinach or wheat pasta is OK).  Multi-grain bread is OK - like multi-grain flat bread or sandwich thins. Avoid alcohol in excess. Exercise is also important.    Eat all the vegetables you want - avoid meat, especially red meat and dairy - especially cheese.  Cheese is the most concentrated form of trans-fats which is the worst thing to clog up our arteries. Veggie cheese is OK which can be found in the fresh produce section at [employer] or [employer] or [employer]  ++++++++++++++++++++++ DASH Eating Plan  DASH stands for "Dietary Approaches to Stop Hypertension."   The DASH eating plan is a healthy eating plan that has been shown to reduce high blood pressure (hypertension). Additional health benefits may include reducing the risk of type 2 diabetes mellitus, heart disease, and stroke. The DASH eating plan may also help with weight loss. WHAT DO I NEED TO KNOW ABOUT THE DASH EATING PLAN? For the DASH eating plan, you will follow these general guidelines:  Choose foods with a percent daily value for sodium of less than  5% (as listed on the food label).  Use salt-free seasonings or herbs instead of table salt or sea salt.  Check with your health care  provider or pharmacist before using salt substitutes.  Eat lower-sodium products, often labeled as "lower sodium" or "no salt added."  Eat fresh foods.  Eat more vegetables, fruits, and low-fat dairy products.  Choose whole grains. Look for the word "whole" as the first word in the ingredient list.  Choose fish   Limit sweets, desserts, sugars, and sugary drinks.  Choose heart-healthy fats.  Eat veggie cheese   Eat more home-cooked food and less restaurant, buffet, and fast food.  Limit fried foods.  Cook foods using methods other than frying.  Limit canned vegetables. If you do use them, rinse them well to decrease the sodium.  When eating at a restaurant, ask that your food be prepared with less salt, or no salt if possible.                      WHAT FOODS CAN I EAT? Read Dr Fara Olden Fuhrman's books on The End of Dieting & The End of Diabetes  Grains Whole grain or whole wheat bread. Brown rice. Whole grain or whole wheat pasta. Quinoa, bulgur, and whole grain cereals. Low-sodium cereals. Corn or whole wheat flour tortillas. Whole grain cornbread. Whole grain crackers. Low-sodium crackers.  Vegetables Fresh or frozen vegetables (raw, steamed, roasted, or grilled). Low-sodium or reduced-sodium tomato and vegetable juices. Low-sodium or reduced-sodium tomato sauce and paste. Low-sodium or reduced-sodium canned vegetables.   Fruits All fresh, canned (in natural juice), or frozen fruits.  Protein Products  All fish and seafood.  Dried beans, peas, or lentils. Unsalted nuts and seeds. Unsalted canned beans.  Dairy Low-fat dairy products, such as skim or 1% milk, 2% or reduced-fat cheeses, low-fat ricotta or cottage cheese, or plain low-fat yogurt. Low-sodium or reduced-sodium cheeses.  Fats and Oils Tub margarines without trans fats. Light or reduced-fat mayonnaise and salad dressings (reduced sodium). Avocado. Safflower, olive, or canola oils. Natural peanut or almond  butter.  Other Unsalted popcorn and pretzels. The items listed above may not be a complete list of recommended foods or beverages. Contact your dietitian for more options.  ++++++++++++++++++  WHAT FOODS ARE NOT RECOMMENDED? Grains/ White flour or wheat flour White bread. White pasta. White rice. Refined cornbread. Bagels and croissants. Crackers that contain trans fat.  Vegetables  Creamed or fried vegetables. Vegetables in a . Regular canned vegetables. Regular canned tomato sauce and paste. Regular tomato and vegetable juices.  Fruits Dried fruits. Canned fruit in light or heavy syrup. Fruit juice.  Meat and Other Protein Products Meat in general - RED meat & White meat.  Fatty cuts of meat. Ribs, chicken wings, all processed meats as bacon, sausage, bologna, salami, fatback, hot dogs, bratwurst and packaged luncheon meats.  Dairy Whole or 2% milk, cream, half-and-half, and cream cheese. Whole-fat or sweetened yogurt. Full-fat cheeses or blue cheese. Non-dairy creamers and whipped toppings. Processed cheese, cheese spreads, or cheese curds.  Condiments Onion and garlic salt, seasoned salt, table salt, and sea salt. Canned and packaged gravies. Worcestershire sauce. Tartar sauce. Barbecue sauce. Teriyaki sauce. Soy sauce, including reduced sodium. Steak sauce. Fish sauce. Oyster sauce. Cocktail sauce. Horseradish. Ketchup and mustard. Meat flavorings and tenderizers. Bouillon cubes. Hot sauce. Tabasco sauce. Marinades. Taco seasonings. Relishes.  Fats and Oils Butter, stick margarine, lard, shortening and bacon fat. Coconut, palm kernel, or palm oils. Regular salad  dressings.  Pickles and olives. Salted popcorn and pretzels.  The items listed above may not be a complete list of foods and beverages to avoid.

## 2018-04-24 NOTE — Progress Notes (Deleted)
3 Month Follow Up   Assessment and Plan:   Hypertension Well controlled with current medications  Monitor blood pressure at home; patient to call if consistently greater than 140/90 Continue DASH diet.   Reminder to go to the ER if any CP, SOB, nausea, dizziness, severe HA, changes vision/speech, left arm numbness and tingling and jaw pain.  Cholesterol Continue current medications Currently at goal;  Continue low cholesterol diet and exercise.  Check lipid panel.   Diabetes {with or without complications:30421263} Continue current medications Discussed dietary and exercise modifications Perform daily foot/skin check, notify office of any concerning changes.  Will check A1C  Obesity with co morbidities Long discussion about weight loss, diet, and exercise Recommended diet heavy in fruits and veggies and low in animal meats, cheeses, and dairy products, appropriate calorie intake Discussed ideal weight for height (below ***) and initial weight goal (***) Patient will work on *** Will follow up in 3 months  Vitamin D Def At goal at last visit;  Continue supplementation  of Vitamin D     IU to maintain goal of 70-100 Will check Vit D level  Hypothyroidism Asymptomatic Continue current medications Will check TSH Reminder to take on an empty stomach 30-52mins before first meal of the day.  Continue diet and meds as discussed. Further disposition pending results of labs. Discussed med's effects and SE's.   Over 30 minutes of exam, counseling, chart review, and critical decision making was performed.   Future Appointments  Date Time Provider Department Center  04/25/2018  3:30 PM Ruth Negus, NP [facility] None  06/21/2018  1:30 PM Levert Feinstein, MD [facility] None  07/27/2018  3:00 PM Lucky Cowboy, MD [facility] None    ----------------------------------------------------------------------------------------------------------------------  HPI 58 y.o. female  presents  for 3 month follow up on elevated blood pressures w/o diagnosis of HTN, HLD, Migraine, Hypothyroidism,  Pre-Diabetes and Vitamin D Deficiency.     In 2009, she had a Right MCA CVA attributed to a Right Carotid artery dissection from fibromuscular dysplasia and has residual of a mild spastic Left HP and mild cognitive dysfunction difficulty focusing/concentrating. These latter sx's are improved with routine Adderall. Patient also follow with Dr Terrace Arabia, Neurologist, for Bo-Tox injections for her spasticity and migraines.   BMI is There is no height or weight on file to calculate BMI., she {HAS HAS JXB:14782} been working on diet and exercise. Wt Readings from Last 3 Encounters:  03/23/18 164 lb 8 oz (74.6 kg)  02/02/18 162 lb (73.5 kg)  01/09/18 162 lb 8 oz (73.7 kg)    Last visit she had elevated blood pressures.  Her blood pressure {HAS HAS NOT:18834} been controlled at home, today their BP is    She {DOES_DOES NFA:21308} workout. She denies any cardiac symptoms, chest pains, palpitations, shortness of breath, dizziness or lower extremity edema.     She {ACTION; IS/IS MVH:84696295} on cholesterol medication {Cholesterol meds:21887} and denies myalgias. Her cholesterol {ACTION; IS/IS NOT:21021397} at goal. The cholesterol last visit was:   Lab Results  Component Value Date   CHOL 192 01/09/2018   HDL 59 01/09/2018   LDLCALC 101 (H) 01/09/2018   TRIG 201 (H) 01/09/2018   CHOLHDL 3.3 01/09/2018    She {Has/has not:18111} been working on diet and exercise for prediabetes ( 5.8%, 2008), and denies {Symptoms; diabetes w/o none:19199}. Last A1C in the office was:  Lab Results  Component Value Date   HGBA1C 5.6 01/09/2018    Patient has been on Thyroid Supression since  Thyroid surgeryfor benign nodule inin 2007. Her last TSH was 0.91 on 12/2017.  She is currently taking levothyroxine 100mcg daily and asymptomatic.   Patient is on Vitamin D supplement.   Lab Results  Component Value Date    VD25OH 47 01/09/2018       Current Medications:  Current Outpatient Medications on File Prior to Visit  Medication Sig  . albuterol (PROVENTIL HFA) 108 (90 Base) MCG/ACT inhaler 1 to 2 inhalations 10-15 minutes apart every 4 hours if needed for asthma rescue  . ALPRAZolam (XANAX) 1 MG tablet TAKE 1/2 TO 1 TABLET 2 TO 3 X / DAY AND PLEASE TRY TO LIMIT TO 5 DAYS /WEEK TO AVOID ADDICTION (Patient taking differently: Take one tablet as needed)  . amphetamine-dextroamphetamine (ADDERALL) 10 MG tablet Take 1/2 to 1 tablet 1 or 2 x daily for ADD  . aspirin EC 81 MG tablet Take 81 mg by mouth daily.  . Botulinum Toxin Type A (BOTOX) 200 UNITS SOLR Inject as directed. Change to BOTOX A 100  units  . buPROPion (WELLBUTRIN XL) 300 MG 24 hr tablet TAKE 1 TABLET (300 MG TOTAL) BY MOUTH DAILY. WITH FOOD  . butalbital-acetaminophen-caffeine (FIORICET, ESGIC) 50-325-40 MG tablet Take 1 tablet by mouth daily as needed for headache. Do not refill in less than 30 days,  . cetirizine (ZYRTEC) 10 MG tablet Take 10 mg by mouth daily.  . ciprofloxacin (CIPRO) 500 MG tablet TAKE 1 TABLET BY MOUTH TWICE A DAY FOR 7 DAYS  . Diclofenac Potassium (CAMBIA) 50 MG PACK TAKE 1 PACKET AT ONSET OF MIGRAINE. CAN REPEAT IN 2 HR ONCE IF NEEDED. NO MORE THAN 2 DOSES IN 24HR.  . fenofibrate micronized (LOFIBRA) 134 MG capsule TAKE 1 CAPSULE EVERY DAY  . levothyroxine (SYNTHROID, LEVOTHROID) 100 MCG tablet TAKE 1 TABLET BY MOUTH EVERY DAY  . magnesium oxide (MAG-OX) 400 MG tablet Take 400 mg by mouth 2 (two) times daily.   . montelukast (SINGULAIR) 10 MG tablet TAKE 1 TABLET BY MOUTH EVERYDAY AT BEDTIME  . Multiple Vitamin (MULTIVITAMIN WITH MINERALS) TABS tablet Take 1 tablet by mouth daily.  . nortriptyline (PAMELOR) 25 MG capsule Take 2 capsules (50 mg total) by mouth at bedtime.  Marland Kitchen. omeprazole (PRILOSEC) 40 MG capsule TAKE 1 CAPSULE (40 MG TOTAL) BY MOUTH DAILY. FOR ACID INDIGESTION AND REFLUX  . prochlorperazine (COMPAZINE) 5  MG tablet TAKE 1 TABLET BY MOUTH 3 TIMES A DAY AS NEEDED FOR NAUSEA  . Riboflavin 100 MG TABS Take 100 mg by mouth. bid  . rosuvastatin (CRESTOR) 40 MG tablet   . tiZANidine (ZANAFLEX) 2 MG tablet TAKE 1 TABLET (2 MG TOTAL) BY MOUTH EVERY 6 (SIX) HOURS AS NEEDED FOR MUSCLE SPASMS.  Marland Kitchen. topiramate (TOPAMAX) 100 MG tablet Take 1.5 tablets (150 mg total) by mouth daily.  . verapamil (CALAN-SR) 240 MG CR tablet Take 1 tablet every morning on a full stomach for BP & Migraine prevention  . vitamin B-12 (CYANOCOBALAMIN) 1000 MCG tablet Take 1,000 mcg by mouth daily.  . Vitamin D, Ergocalciferol, (DRISDOL) 1.25 MG (50000 UT) CAPS capsule TAKE 1 CAPSULE EVERY DAY   No current facility-administered medications on file prior to visit.     Allergies:  Allergies  Allergen Reactions  . Penicillins Anaphylaxis and Hives  . Imitrex [Sumatriptan]   . Other Swelling    kiwi  . Sulfa Antibiotics Hives  . Zomig [Zolmitriptan]      Medical History:  Past Medical History:  Diagnosis Date  .  Anemia   . Anxiety   . Asthma   . Chronic heartburn   . Depression   . Elevated cholesterol    on Crestor  . Headache(784.0)   . Hypertension   . Hypothyroidism    S/P thyroidectomy  . Migraine   . Occlusion and stenosis of carotid artery with cerebral infarction   . Spasm of muscle   . Unspecified cerebral artery occlusion with cerebral infarction   . Vitamin D deficiency     Family history- Reviewed and unchanged   Social history- Reviewed and unchanged   Names of Other Physician/Practitioners you currently use: 1. [facility] here for primary care 2. ***, eye doctor, last visit *** 3. ***, dentist, last visit *** Patient Care Team: Lucky CowboyMcKeown, William, MD as PCP - General   Screening Tests: Immunization History  Administered Date(s) Administered  . Influenza Inj Mdck Quad With Preservative 01/20/2017, 01/09/2018  . Influenza Split 01/24/2014  .  Influenza, Seasonal, Injecte, Preservative Fre 02/09/2016  . Influenza-Unspecified 11/28/2011  . PPD Test 04/04/2013, 04/22/2015, 05/18/2016, 06/20/2017  . Pneumococcal Polysaccharide-23 03/29/1998  . Td 03/29/2002  . Tdap 05/18/2016     Vaccinations: TD or Tdap: 2018  Influenza: 2019 Pneumococcal: 2000 Prevnar13: ? Shingles/Zostavax: DUE?   Preventative Care: Last colonoscopy: DUE? Last mammogram: 2014 Last pap smear/pelvic exam: 2016  DEXA:2007 DUE? Osteoporosis?  Imaging: Chest X-ray: 2014 EKG: 05/2017 ECHO: 08/2007     Review of Systems:  ROS    Physical Exam: There were no vitals taken for this visit. Wt Readings from Last 3 Encounters:  03/23/18 164 lb 8 oz (74.6 kg)  02/02/18 162 lb (73.5 kg)  01/09/18 162 lb 8 oz (73.7 kg)   General Appearance: Well nourished, in no apparent distress. Eyes: PERRLA, EOMs, conjunctiva no swelling or erythema Sinuses: No Frontal/maxillary tenderness ENT/Mouth: Ext aud canals clear, TMs without erythema, bulging. No erythema, swelling, or exudate on post pharynx.  Tonsils not swollen or erythematous. Hearing normal.  Neck: Supple, thyroid normal.  Respiratory: Respiratory effort normal, BS equal bilaterally without rales, rhonchi, wheezing or stridor.  Cardio: RRR with no MRGs. Brisk peripheral pulses without edema.  Abdomen: Soft, + BS.  Non tender, no guarding, rebound, hernias, masses. Lymphatics: Non tender without lymphadenopathy.  Musculoskeletal: Full ROM, 5/5 strength, {PSY - GAIT AND STATION:22860} gait Skin: Warm, dry without rashes, lesions, ecchymosis.  Neuro: Cranial nerves intact. No cerebellar symptoms.  Psych: Awake and oriented X 3, normal affect, Insight and Judgment appropriate.    Ruth NegusKyra Trudi Morgenthaler, NP 11:18 PM [address] [facility]

## 2018-04-25 ENCOUNTER — Encounter: Payer: Self-pay | Admitting: Adult Health Nurse Practitioner

## 2018-04-25 ENCOUNTER — Ambulatory Visit: Payer: Self-pay | Admitting: Adult Health

## 2018-04-25 ENCOUNTER — Ambulatory Visit (INDEPENDENT_AMBULATORY_CARE_PROVIDER_SITE_OTHER): Payer: BLUE CROSS/BLUE SHIELD | Admitting: Adult Health Nurse Practitioner

## 2018-04-25 VITALS — BP 110/72 | HR 103 | Temp 97.7°F | Ht 68.5 in | Wt 164.0 lb

## 2018-04-25 DIAGNOSIS — F988 Other specified behavioral and emotional disorders with onset usually occurring in childhood and adolescence: Secondary | ICD-10-CM

## 2018-04-25 DIAGNOSIS — E039 Hypothyroidism, unspecified: Secondary | ICD-10-CM | POA: Diagnosis not present

## 2018-04-25 DIAGNOSIS — E782 Mixed hyperlipidemia: Secondary | ICD-10-CM | POA: Diagnosis not present

## 2018-04-25 DIAGNOSIS — K219 Gastro-esophageal reflux disease without esophagitis: Secondary | ICD-10-CM | POA: Diagnosis not present

## 2018-04-25 DIAGNOSIS — E559 Vitamin D deficiency, unspecified: Secondary | ICD-10-CM

## 2018-04-25 DIAGNOSIS — F419 Anxiety disorder, unspecified: Secondary | ICD-10-CM

## 2018-04-25 DIAGNOSIS — Z6825 Body mass index (BMI) 25.0-25.9, adult: Secondary | ICD-10-CM

## 2018-04-25 DIAGNOSIS — I69954 Hemiplegia and hemiparesis following unspecified cerebrovascular disease affecting left non-dominant side: Secondary | ICD-10-CM | POA: Diagnosis not present

## 2018-04-25 DIAGNOSIS — Z79899 Other long term (current) drug therapy: Secondary | ICD-10-CM

## 2018-04-25 DIAGNOSIS — R030 Elevated blood-pressure reading, without diagnosis of hypertension: Secondary | ICD-10-CM | POA: Diagnosis not present

## 2018-04-25 DIAGNOSIS — R7303 Prediabetes: Secondary | ICD-10-CM

## 2018-04-25 DIAGNOSIS — K760 Fatty (change of) liver, not elsewhere classified: Secondary | ICD-10-CM | POA: Diagnosis not present

## 2018-04-25 DIAGNOSIS — IMO0002 Reserved for concepts with insufficient information to code with codable children: Secondary | ICD-10-CM

## 2018-04-25 DIAGNOSIS — G43709 Chronic migraine without aura, not intractable, without status migrainosus: Secondary | ICD-10-CM | POA: Diagnosis not present

## 2018-04-26 LAB — LIPID PANEL
Cholesterol: 178 mg/dL (ref <200)
HDL: 51 mg/dL (ref >50)
LDL Cholesterol (Calc): 95 mg/dL (calc)
Non-HDL Cholesterol (Calc): 127 mg/dL (calc) (ref <130)
Total CHOL/HDL Ratio: 3.5 (calc) (ref <5.0)
Triglycerides: 234 mg/dL — ABNORMAL HIGH (ref <150)

## 2018-04-26 LAB — COMPLETE METABOLIC PANEL WITH GFR
AG Ratio: 2 (calc) (ref 1.0–2.5)
ALT: 30 U/L — ABNORMAL HIGH (ref 6–29)
AST: 20 U/L (ref 10–35)
Albumin: 4.6 g/dL (ref 3.6–5.1)
Alkaline phosphatase (APISO): 90 U/L (ref 33–130)
BUN: 16 mg/dL (ref 7–25)
CO2: 23 mmol/L (ref 20–32)
Calcium: 9.7 mg/dL (ref 8.6–10.4)
Chloride: 109 mmol/L (ref 98–110)
Creat: 0.99 mg/dL (ref 0.50–1.05)
GFR, Est African American: 72 mL/min/{1.73_m2} (ref >=60)
GFR, Est Non African American: 62 mL/min/{1.73_m2} (ref >=60)
Globulin: 2.3 g/dL (calc) (ref 1.9–3.7)
Glucose, Bld: 79 mg/dL (ref 65–99)
Potassium: 3.9 mmol/L (ref 3.5–5.3)
Sodium: 142 mmol/L (ref 135–146)
Total Bilirubin: 0.6 mg/dL (ref 0.2–1.2)
Total Protein: 6.9 g/dL (ref 6.1–8.1)

## 2018-04-26 LAB — CBC WITH DIFFERENTIAL/PLATELET
Absolute Monocytes: 366 cells/uL (ref 200–950)
Basophils Absolute: 31 cells/uL (ref 0–200)
Basophils Relative: 0.5 %
Eosinophils Absolute: 189 cells/uL (ref 15–500)
Eosinophils Relative: 3.1 %
HCT: 47.1 % — ABNORMAL HIGH (ref 35.0–45.0)
Hemoglobin: 16.3 g/dL — ABNORMAL HIGH (ref 11.7–15.5)
Lymphs Abs: 1739 cells/uL (ref 850–3900)
MCH: 31.7 pg (ref 27.0–33.0)
MCHC: 34.6 g/dL (ref 32.0–36.0)
MCV: 91.5 fL (ref 80.0–100.0)
MPV: 11.6 fL (ref 7.5–12.5)
Monocytes Relative: 6 %
Neutro Abs: 3776 cells/uL (ref 1500–7800)
Neutrophils Relative %: 61.9 %
Platelets: 195 10*3/uL (ref 140–400)
RBC: 5.15 10*6/uL — ABNORMAL HIGH (ref 3.80–5.10)
RDW: 12.5 % (ref 11.0–15.0)
Total Lymphocyte: 28.5 %
WBC: 6.1 10*3/uL (ref 3.8–10.8)

## 2018-04-26 LAB — HEMOGLOBIN A1C
Hgb A1c MFr Bld: 5.5 % of total Hgb (ref <5.7)
Mean Plasma Glucose: 111 (calc)
eAG (mmol/L): 6.2 (calc)

## 2018-04-26 LAB — VITAMIN D 25 HYDROXY (VIT D DEFICIENCY, FRACTURES): Vit D, 25-Hydroxy: 61 ng/mL (ref 30–100)

## 2018-04-26 LAB — INSULIN, RANDOM: Insulin: 21.7 u[IU]/mL — ABNORMAL HIGH (ref 2.0–19.6)

## 2018-04-26 LAB — MAGNESIUM: Magnesium: 2 mg/dL (ref 1.5–2.5)

## 2018-04-26 LAB — TSH: TSH: 0.25 mIU/L — ABNORMAL LOW (ref 0.40–4.50)

## 2018-04-28 MED ORDER — LEVOTHYROXINE SODIUM 88 MCG PO TABS: 88.0000 ug | ORAL_TABLET | Freq: Every day | ORAL | 1 refills | Status: DC

## 2018-04-29 NOTE — Progress Notes (Signed)
3 Month Follow Up   Assessment and Plan:   Ruth Gregory was seen today for follow-up.  Diagnoses and all orders for this visit:  Chronic migraine Managing well at this time, managed with Neurology and receiving Botox injections Q743months. -Topamax ER 150 mg and nortriptyline 50mg  every night as preventive medication, magnesium oxide 400 mg, riboflavin 100 mg twice a day.  R -For acute flares she utilizes Cambia PRN.  She also has Fioricet PRN, which she uses occassionally and Prednisone 20mg  for rescue therapy.  Fatty liver Check labs, avoid tylenol, alcohol, weight loss advised.  Will continue to monitor -     COMPLETE METABOLIC PANEL WITH GFR  Gastroesophageal reflux disease, esophagitis presence not specified Doing well at this time, continue Prilosec 40mg  with benefit -     Magnesium  Spastic hemiplegia of left nondominant side as late effect of cerebrovascular disease, unspecified cerebrovascular disease type (HCC) Doing well at this time with movement and function Continue Botox injections, follow with Neurology, Dr Terrace ArabiaYan for this. -     COMPLETE METABOLIC PANEL WITH GFR  Anxiety Doing well at this time Continue Alprazolam with benefit Stress management techniques discussed, increase water, good sleep hygiene discussed, increase exercise, and increase veggies.    Mixed hyperlipidemia Continue medications: Continue low cholesterol diet and exercise.  Check lipid panel.  -     Lipid panel -Taking Lofibra 134mg  and Crestor 40mg   Hypothyroidism, unspecified type -     TSH -     levothyroxine (SYNTHROID) 88 MCG tablet; Take 1 tablet (88 mcg total) by mouth daily. Reminder to take on an empty stomach 30-2660mins before first meal of the day.  ADD (attention deficit disorder) without hyperactivity Doing well on current regiment Continue with benefit -Adderall 10mg  1/2 to 1 tablet BID  Vitamin D deficiency -     VITAMIN D 25 Hydroxy (Vit-D Deficiency, )  Prediabetes -      Hemoglobin A1c -     Insulin, random  Elevated blood pressure reading without diagnosis of hypertension Controlled today, will continue to monitor -     CBC with Differential/Platelet -     COMPLETE METABOLIC PANEL WITH GFR  Medication management -     Magnesium -     Lipid panel -     Cancel: TSH -     Hemoglobin A1c -     Insulin, random -     VITAMIN D 25 Hydroxy (Vit-D Deficiency) -     CBC with Differential/Platelet -     COMPLETE METABOLIC PANEL WITH GFR -     TSH  BMI 25.0-25.9 Discussed dietary and exercise modifications Continue to recommend diet heavy in fruits and veggies and low in animal meats, cheeses, and dairy products, appropriate calorie intake Discuss exercise recommendations routinely Continue to monitor weight at each visit  Continue diet and meds as discussed. Further disposition pending results of labs. Discussed med's effects and SE's.   Over 30 minutes of exam, counseling, chart review, and critical decision making was performed.   Future Appointments  Date Time Provider Department Center  06/21/2018  1:30 PM Levert FeinsteinYan, Yijun, MD [facility] None  07/27/2018  3:00 PM Lucky CowboyMcKeown, William, MD [facility] None    ----------------------------------------------------------------------------------------------------------------------  HPI 58 y.o. female  presents for 3 month follow up on elevated blood pressures w/o diagnosis of HTN, HLD, Migraine, Hypothyroidism,  Pre-Diabetes and Vitamin D Deficiency.     In 2009, she had a Right MCA CVA attributed to a Right Carotid artery  dissection from fibromuscular dysplasia and has residual of a mild spastic Left HP and mild cognitive dysfunction difficulty focusing/concentrating. These latter sx's are improved with routine Adderall. Patient also follow with Dr Terrace Arabia, Neurologist, for Bo-Tox injections for her spasticity and migraines.  Her last one was in December and next one in March.     BMI is Body mass index is 24.57 kg/m.,  she has been working on diet and exercise. Wt Readings from Last 3 Encounters:  04/25/18 164 lb (74.4 kg)  03/23/18 164 lb 8 oz (74.6 kg)  02/02/18 162 lb (73.5 kg)    Last visit she had elevated blood pressures.  Her blood pressure has been controlled at home, today their BP is BP: 110/72  She does not workout. She denies any cardiac symptoms, chest pains, palpitations, shortness of breath, dizziness or lower extremity edema.     She is on cholesterol medication Rosuvastatin and denies myalgias. Her cholesterol is not at goal. The cholesterol last visit was:   Lab Results  Component Value Date   CHOL 178 04/25/2018   HDL 51 04/25/2018   LDLCALC 95 04/25/2018   TRIG 234 (H) 04/25/2018   CHOLHDL 3.5 04/25/2018    She has not been working on diet and exercise for prediabetes ( 5.8%, 2008), and denies hyperglycemia, hypoglycemia , nausea, polydipsia and polyuria. Last A1C in the office was:  Lab Results  Component Value Date   HGBA1C 5.5 04/25/2018    Patient has been on Thyroid Supression since Thyroid surgeryfor benign nodule inin 2007. Her last TSH was 0.91 on 12/2017.  She is currently taking levothyroxine daily and asymptomatic.   Patient is on Vitamin D supplement.   Lab Results  Component Value Date   VD25OH 61 04/25/2018       Current Medications:  Current Outpatient Medications on File Prior to Visit  Medication Sig  . albuterol (PROVENTIL HFA) 108 (90 Base) MCG/ACT inhaler 1 to 2 inhalations 10-15 minutes apart every 4 hours if needed for asthma rescue  . ALPRAZolam (XANAX) 1 MG tablet TAKE 1/2 TO 1 TABLET 2 TO 3 X / DAY AND PLEASE TRY TO LIMIT TO 5 DAYS /WEEK TO AVOID ADDICTION (Patient taking differently: Take one tablet as needed)  . amphetamine-dextroamphetamine (ADDERALL) 10 MG tablet Take 1/2 to 1 tablet 1 or 2 x daily for ADD  . aspirin EC 81 MG tablet Take 81 mg by mouth daily.  . Botulinum Toxin Type A (BOTOX) 200 UNITS SOLR Inject as directed.  Change to BOTOX A 100  units  . buPROPion (WELLBUTRIN XL) 300 MG 24 hr tablet TAKE 1 TABLET (300 MG TOTAL) BY MOUTH DAILY. WITH FOOD  . butalbital-acetaminophen-caffeine (FIORICET, ESGIC) 50-325-40 MG tablet Take 1 tablet by mouth daily as needed for headache. Do not refill in less than 30 days,  . cetirizine (ZYRTEC) 10 MG tablet Take 10 mg by mouth daily.  . ciprofloxacin (CIPRO) 500 MG tablet TAKE 1 TABLET BY MOUTH TWICE A DAY FOR 7 DAYS  . Diclofenac Potassium (CAMBIA) 50 MG PACK TAKE 1 PACKET AT ONSET OF MIGRAINE. CAN REPEAT IN 2 HR ONCE IF NEEDED. NO MORE THAN 2 DOSES IN 24HR.  . fenofibrate micronized (LOFIBRA) 134 MG capsule TAKE 1 CAPSULE EVERY DAY  . magnesium oxide (MAG-OX) 400 MG tablet Take 400 mg by mouth 2 (two) times daily.   . montelukast (SINGULAIR) 10 MG tablet TAKE 1 TABLET BY MOUTH EVERYDAY AT BEDTIME  . Multiple Vitamin (MULTIVITAMIN WITH MINERALS)  TABS tablet Take 1 tablet by mouth daily.  . nortriptyline (PAMELOR) 25 MG capsule Take 2 capsules (50 mg total) by mouth at bedtime.  Marland Kitchen omeprazole (PRILOSEC) 40 MG capsule TAKE 1 CAPSULE (40 MG TOTAL) BY MOUTH DAILY. FOR ACID INDIGESTION AND REFLUX  . prochlorperazine (COMPAZINE) 5 MG tablet TAKE 1 TABLET BY MOUTH 3 TIMES A DAY AS NEEDED FOR NAUSEA  . Riboflavin 100 MG TABS Take 100 mg by mouth. bid  . rosuvastatin (CRESTOR) 40 MG tablet   . tiZANidine (ZANAFLEX) 2 MG tablet TAKE 1 TABLET (2 MG TOTAL) BY MOUTH EVERY 6 (SIX) HOURS AS NEEDED FOR MUSCLE SPASMS.  Marland Kitchen topiramate (TOPAMAX) 100 MG tablet Take 1.5 tablets (150 mg total) by mouth daily.  . verapamil (CALAN-SR) 240 MG CR tablet Take 1 tablet every morning on a full stomach for BP & Migraine prevention  . vitamin B-12 (CYANOCOBALAMIN) 1000 MCG tablet Take 1,000 mcg by mouth daily.  . Vitamin D, Ergocalciferol, (DRISDOL) 1.25 MG (50000 UT) CAPS capsule TAKE 1 CAPSULE EVERY DAY   No current facility-administered medications on file prior to visit.     Allergies:  Allergies   Allergen Reactions  . Penicillins Anaphylaxis and Hives  . Imitrex [Sumatriptan]   . Other Swelling    kiwi  . Sulfa Antibiotics Hives  . Zomig [Zolmitriptan]      Medical History:  Past Medical History:  Diagnosis Date  . Anemia   . Anxiety   . Asthma   . Chronic heartburn   . Depression   . Elevated cholesterol    on Crestor  . Headache(784.0)   . Hypertension   . Hypothyroidism    S/P thyroidectomy  . Migraine   . Occlusion and stenosis of carotid artery with cerebral infarction   . Spasm of muscle   . Unspecified cerebral artery occlusion with cerebral infarction   . Vitamin D deficiency     Family history- Reviewed and unchanged   Social history- Reviewed and unchanged   Names of Other Physician/Practitioners you currently use: 1. [facility] here for primary care 2. Eye Exam due for 2020 3.Dentiset Q1months  Patient Care Team: Lucky Cowboy, MD as PCP - General Levert Feinstein, MD as Consulting Physician (Neurology)   Screening Tests: Immunization History  Administered Date(s) Administered  . Influenza Inj Mdck Quad With Preservative 01/20/2017, 01/09/2018  . Influenza Split 01/24/2014  . Influenza, Seasonal, Injecte, Preservative Fre 02/09/2016  . Influenza-Unspecified 11/28/2011  . PPD Test 04/04/2013, 04/22/2015, 05/18/2016, 06/20/2017  . Pneumococcal Polysaccharide-23 03/29/1998  . Td 03/29/2002  . Tdap 05/18/2016     Vaccinations: TD or Tdap: 2018  Influenza: 2019 Pneumococcal: 2000 Prevnar13: ? Shingles/Zostavax: Has had Shingles in pastx3, Varicella Titer 1,397.00   Preventative Care: Last colonoscopy: 20 Last mammogram: 2019 Last pap smear/pelvic exam: 2016, Follow with GYN for this DEXA:2007 DUE Discussed with patient, schedule with mammogram for 2020.  Imaging: Chest X-ray: 2014 EKG: 05/2017 ECHO: 08/2007     Review of Systems:  Review of Systems  Constitutional: Negative for  chills, diaphoresis, fever, malaise/fatigue and weight loss.  HENT: Negative for congestion, ear discharge, ear pain, hearing loss, nosebleeds, sinus pain, sore throat and tinnitus.   Eyes: Negative for blurred vision, double vision, photophobia, pain, discharge and redness.  Respiratory: Negative for cough, hemoptysis, sputum production, shortness of breath, wheezing and stridor.   Cardiovascular: Negative for chest pain, palpitations, orthopnea, claudication, leg swelling and PND.  Gastrointestinal: Negative for abdominal pain, blood  in stool, constipation, diarrhea, heartburn, melena, nausea and vomiting.  Genitourinary: Negative for dysuria, flank pain, frequency, hematuria and urgency.  Musculoskeletal: Positive for myalgias and neck pain. Negative for back pain, falls and joint pain.  Skin: Negative for itching and rash.  Neurological: Positive for sensory change and weakness. Negative for dizziness, tingling, tremors, speech change, focal weakness, seizures, loss of consciousness and headaches.  Endo/Heme/Allergies: Negative for environmental allergies and polydipsia. Does not bruise/bleed easily.  Psychiatric/Behavioral: Negative for depression, hallucinations, memory loss, substance abuse and suicidal ideas. The patient is nervous/anxious and has insomnia.        Physical Exam: BP 110/72   Pulse (!) 103   Temp 97.7 F (36.5 C)   Ht 5' 8.5" (1.74 m)   Wt 164 lb (74.4 kg)   SpO2 98%   BMI 24.57 kg/m  Wt Readings from Last 3 Encounters:  04/25/18 164 lb (74.4 kg)  03/23/18 164 lb 8 oz (74.6 kg)  02/02/18 162 lb (73.5 kg)   General Appearance: Well nourished, in no apparent distress. Eyes: PERRLA, EOMs, conjunctiva no swelling or erythema Sinuses: No Frontal/maxillary tenderness ENT/Mouth: Ext aud canals clear, TMs without erythema, bulging. No erythema, swelling, or exudate on post pharynx.  Tonsils not swollen or erythematous. Hearing normal.  Neck: Supple, thyroid normal.   Respiratory: Respiratory effort normal, BS equal bilaterally without rales, rhonchi, wheezing or stridor.  Cardio: RRR with no MRGs. Brisk peripheral pulses without edema.  Abdomen: Soft, + BS.  Non tender, no guarding, rebound, hernias, masses. Lymphatics: Non tender without lymphadenopathy.  Musculoskeletal: Full ROM, 5/5 strength right, 4/5 strength on left. normal gait, slow. Skin: Warm, dry without rashes, lesions, ecchymosis.  Neuro: Cranial nerves intact. No cerebellar symptoms.  Psych: Awake and oriented X 3, normal affect, Insight and Judgment appropriate.    Elder Negus, NP 10:55 AM [facility]

## 2018-04-30 ENCOUNTER — Encounter: Payer: Self-pay | Admitting: Adult Health Nurse Practitioner

## 2018-05-05 ENCOUNTER — Telehealth: Payer: Self-pay | Admitting: Adult Health Nurse Practitioner

## 2018-05-05 NOTE — Telephone Encounter (Signed)
Patient called:remider to send rx of Adderall and Alprazolam to [facility]. Patient states she does NOT take biotin. She has started new rx of Levothyroxine. schd 4wk lab only recheck TSH

## 2018-05-07 ENCOUNTER — Other Ambulatory Visit: Payer: Self-pay | Admitting: Adult Health Nurse Practitioner

## 2018-05-07 DIAGNOSIS — F419 Anxiety disorder, unspecified: Secondary | ICD-10-CM

## 2018-05-07 DIAGNOSIS — F988 Other specified behavioral and emotional disorders with onset usually occurring in childhood and adolescence: Secondary | ICD-10-CM

## 2018-05-07 MED ORDER — ALPRAZOLAM 1 MG PO TABS: ORAL_TABLET | ORAL | 0 refills | Status: DC

## 2018-05-07 MED ORDER — AMPHETAMINE-DEXTROAMPHETAMINE 10 MG PO TABS: ORAL_TABLET | ORAL | 0 refills | Status: DC

## 2018-05-29 ENCOUNTER — Encounter: Payer: Self-pay | Admitting: Adult Health

## 2018-05-29 ENCOUNTER — Ambulatory Visit (INDEPENDENT_AMBULATORY_CARE_PROVIDER_SITE_OTHER): Payer: BLUE CROSS/BLUE SHIELD | Admitting: Adult Health

## 2018-05-29 VITALS — BP 122/72 | HR 89 | Temp 97.5°F | Ht 68.5 in | Wt 165.8 lb

## 2018-05-29 DIAGNOSIS — R3989 Other symptoms and signs involving the genitourinary system: Secondary | ICD-10-CM

## 2018-05-29 DIAGNOSIS — R350 Frequency of micturition: Secondary | ICD-10-CM | POA: Diagnosis not present

## 2018-05-29 NOTE — Patient Instructions (Signed)
Urinary Frequency, Adult  Urinary frequency means urinating more often than usual. You may urinate every 1-2 hours even though you drink a normal amount of fluid and do not have a bladder infection or condition. Although you urinate more often than normal, the total amount of urine produced in a day is normal.  With urinary frequency, you may have an urgent need to urinate often. The stress and anxiety of needing to find a bathroom quickly can make this urge worse. This condition may go away on its own or you may need treatment at home. Home treatment may include bladder training, exercises, taking medicines, or making changes to your diet.  Follow these instructions at home:  Bladder health     Keep a bladder diary if told by your health care provider. Keep track of:  ? What you eat and drink.  ? How often you urinate.  ? How much you urinate.   Follow a bladder training program if told by your health care provider. This may include:  ? Learning to delay going to the bathroom.  ? Double urinating (voiding). This helps if you are not completely emptying your bladder.  ? Scheduled voiding.   Do Kegel exercises as told by your health care provider. Kegel exercises strengthen the muscles that help control urination, which may help the condition.  Eating and drinking   If told by your health care provider, make diet changes, such as:  ? Avoiding caffeine.  ? Drinking fewer fluids, especially alcohol.  ? Not drinking in the evening.  ? Avoiding foods or drinks that may irritate the bladder. These include coffee, tea, soda, artificial sweeteners, citrus, tomato-based foods, and chocolate.  ? Eating foods that help prevent or ease constipation. Constipation can make this condition worse. Your health care provider may recommend that you:   Drink enough fluid to keep your urine pale yellow.   Take over-the-counter or prescription medicines.   Eat foods that are high in fiber, such as beans, whole grains, and fresh  fruits and vegetables.   Limit foods that are high in fat and processed sugars, such as fried or sweet foods.  General instructions   Take over-the-counter and prescription medicines only as told by your health care provider.   Keep all follow-up visits as told by your health care provider. This is important.  Contact a health care provider if:   You start urinating more often.   You feel pain or irritation when you urinate.   You notice blood in your urine.   Your urine looks cloudy.   You develop a fever.   You begin vomiting.  Get help right away if:   You are unable to urinate.  Summary   Urinary frequency means urinating more often than usual. With urinary frequency, you may urinate every 1-2 hours even though you drink a normal amount of fluid and do not have a bladder infection or other bladder condition.   Your health care provider may recommend that you keep a bladder diary, follow a bladder training program, or make dietary changes.   If told by your health care provider, do Kegel exercises to strengthen the muscles that help control urination.   Take over-the-counter and prescription medicines only as told by your health care provider.   Contact a health care provider if your symptoms do not improve or get worse.  This information is not intended to replace advice given to you by your health care provider. Make sure you   discuss any questions you have with your health care provider.  Document Released: 01/09/2009 Document Revised: 09/22/2017 Document Reviewed: 09/22/2017  Elsevier Interactive Patient Education  2019 [employer]vier [employer]

## 2018-05-29 NOTE — Progress Notes (Signed)
Assessment and Plan:  Ruth Gregory was seen today for acute home visit.  Diagnoses and all orders for this visit:  Urinary frequency/ Sensation of pressure in bladder area UTI versus OAB versus vaginal dryness She arrived very late, insufficient time for pelvic, she would just like UTI ruled out; she is followed by GYN for hx of bladder prolapse s/p sling/tacking, is agreeable to follow up with them if UA negative for infection Alternately may need a referral to urology if persists - she is in agreement with plan  -     Urinalysis w microscopic + reflex cultur  Further disposition pending results of labs. Discussed med's effects and SE's.   Over 15 minutes of exam, counseling, chart review, and critical decision making was performed.   Future Appointments  Date Time Provider Department Center  06/06/2018  9:00 AM [facility] None  06/21/2018  1:30 PM Levert Feinstein, MD [facility] None  07/27/2018  3:00 PM Lucky Cowboy, MD [facility] None    ------------------------------------------------------------------------------------------------------------------   HPI BP 122/72   Pulse 89   Temp (!) 97.5 F (36.4 C)   Ht 5' 8.5" (1.74 m)   Wt 165 lb 12.8 oz (75.2 kg)   SpO2 98%   BMI 24.84 kg/m   58 y.o.female presents for evaluation of frequency and bladder pressure x 3 weeks that she believes began after she underwent ? Renal US by Dr. Allena Katz and requesting we check for UTI. She reports she has had cold chills, denies changes in character, denies dysuria, incontinence, flank pain, hematuria, vaginal discharge, perineal discomfort/itching.    She denies new sexual partners. She is married.   She is s/p TAH, has had bladder sling/tacking, mona lisa procedure and follows with GYN for this.   Past Medical History:  Diagnosis Date  . Anemia   . Anxiety   . Asthma   . Chronic heartburn   . Depression   . Elevated cholesterol    on Crestor  . Headache(784.0)   . Hypertension   .  Hypothyroidism    S/P thyroidectomy  . Migraine   . Occlusion and stenosis of carotid artery with cerebral infarction   . Spasm of muscle   . Unspecified cerebral artery occlusion with cerebral infarction   . Vitamin D deficiency      Allergies  Allergen Reactions  . Penicillins Anaphylaxis and Hives  . Imitrex [Sumatriptan]   . Other Swelling    kiwi  . Sulfa Antibiotics Hives  . Zomig [Zolmitriptan]     Current Outpatient Medications on File Prior to Visit  Medication Sig  . albuterol (PROVENTIL HFA) 108 (90 Base) MCG/ACT inhaler 1 to 2 inhalations 10-15 minutes apart every 4 hours if needed for asthma rescue  . ALPRAZolam (XANAX) 1 MG tablet Take 1/2 tablet to 1 tablet two to three times a day as needed for anxiety. Limit to 5 days a week to avoid addiction.  Marland Kitchen amphetamine-dextroamphetamine (ADDERALL) 10 MG tablet Take 1/2 to 1 tablet 1 or 2 x daily for ADD  . aspirin EC 81 MG tablet Take 81 mg by mouth daily.  . Botulinum Toxin Type A (BOTOX) 200 UNITS SOLR Inject as directed. Change to BOTOX A 100  units  . buPROPion (WELLBUTRIN XL) 300 MG 24 hr tablet TAKE 1 TABLET (300 MG TOTAL) BY MOUTH DAILY. WITH FOOD  . butalbital-acetaminophen-caffeine (FIORICET, ESGIC) 50-325-40 MG tablet Take 1 tablet by mouth daily as needed for headache. Do not refill in less than 30 days,  .  cetirizine (ZYRTEC) 10 MG tablet Take 10 mg by mouth daily.  . ciprofloxacin (CIPRO) 500 MG tablet TAKE 1 TABLET BY MOUTH TWICE A DAY FOR 7 DAYS  . Diclofenac Potassium (CAMBIA) 50 MG PACK TAKE 1 PACKET AT ONSET OF MIGRAINE. CAN REPEAT IN 2 HR ONCE IF NEEDED. NO MORE THAN 2 DOSES IN 24HR.  . fenofibrate micronized (LOFIBRA) 134 MG capsule TAKE 1 CAPSULE EVERY DAY  . levothyroxine (SYNTHROID) 88 MCG tablet Take 1 tablet (88 mcg total) by mouth daily.  . magnesium oxide (MAG-OX) 400 MG tablet Take 400 mg by mouth 2 (two) times daily.   . montelukast (SINGULAIR) 10 MG tablet TAKE 1 TABLET BY MOUTH EVERYDAY AT  BEDTIME  . Multiple Vitamin (MULTIVITAMIN WITH MINERALS) TABS tablet Take 1 tablet by mouth daily.  . nortriptyline (PAMELOR) 25 MG capsule Take 2 capsules (50 mg total) by mouth at bedtime.  Marland Kitchen omeprazole (PRILOSEC) 40 MG capsule TAKE 1 CAPSULE (40 MG TOTAL) BY MOUTH DAILY. FOR ACID INDIGESTION AND REFLUX  . prochlorperazine (COMPAZINE) 5 MG tablet TAKE 1 TABLET BY MOUTH 3 TIMES A DAY AS NEEDED FOR NAUSEA  . Riboflavin 100 MG TABS Take 100 mg by mouth. bid  . rosuvastatin (CRESTOR) 40 MG tablet   . tiZANidine (ZANAFLEX) 2 MG tablet TAKE 1 TABLET (2 MG TOTAL) BY MOUTH EVERY 6 (SIX) HOURS AS NEEDED FOR MUSCLE SPASMS.  Marland Kitchen topiramate (TOPAMAX) 100 MG tablet Take 1.5 tablets (150 mg total) by mouth daily.  . verapamil (CALAN-SR) 240 MG CR tablet Take 1 tablet every morning on a full stomach for BP & Migraine prevention  . vitamin B-12 (CYANOCOBALAMIN) 1000 MCG tablet Take 1,000 mcg by mouth daily.  . Vitamin D, Ergocalciferol, (DRISDOL) 1.25 MG (50000 UT) CAPS capsule TAKE 1 CAPSULE EVERY DAY   No current facility-administered medications on file prior to visit.     ROS: all negative except above.   Physical Exam:  BP 122/72   Pulse 89   Temp (!) 97.5 F (36.4 C)   Ht 5' 8.5" (1.74 m)   Wt 165 lb 12.8 oz (75.2 kg)   SpO2 98%   BMI 24.84 kg/m   General Appearance: Well nourished, in no apparent distress. Eyes: conjunctiva no swelling or erythema ENT/Mouth: Hearing normal.  Neck: Supple Respiratory: Respiratory effort normal, BS equal bilaterally without rales, rhonchi, wheezing or stridor.  Cardio: RRR with no MRGs. Brisk peripheral pulses without edema.  Abdomen: Soft, + BS.  Non tender, no guarding, rebound, hernias, masses. Bladder non-palpable, non-distended Lymphatics: Non tender without lymphadenopathy.  Musculoskeletal: normal gait, No CVA tenderness Skin: Warm, dry without rashes, lesions, ecchymosis.  Neuro: Normal muscle tone, no cerebellar symptoms.   Psych: Awake and  oriented X 3, normal affect, Insight and Judgment appropriate.     Dan Maker, NP 10:01 AM [name]

## 2018-05-30 ENCOUNTER — Other Ambulatory Visit: Payer: Self-pay | Admitting: Adult Health

## 2018-05-30 ENCOUNTER — Other Ambulatory Visit: Payer: Self-pay | Admitting: Neurology

## 2018-05-30 MED ORDER — NITROFURANTOIN MONOHYD MACRO 100 MG PO CAPS: 100.0000 mg | ORAL_CAPSULE | Freq: Two times a day (BID) | ORAL | 0 refills | Status: DC

## 2018-05-31 ENCOUNTER — Other Ambulatory Visit: Payer: Self-pay | Admitting: Adult Health

## 2018-05-31 DIAGNOSIS — D631 Anemia in chronic kidney disease: Secondary | ICD-10-CM | POA: Diagnosis not present

## 2018-05-31 DIAGNOSIS — E559 Vitamin D deficiency, unspecified: Secondary | ICD-10-CM | POA: Diagnosis not present

## 2018-05-31 DIAGNOSIS — N182 Chronic kidney disease, stage 2 (mild): Secondary | ICD-10-CM | POA: Diagnosis not present

## 2018-05-31 DIAGNOSIS — I129 Hypertensive chronic kidney disease with stage 1 through stage 4 chronic kidney disease, or unspecified chronic kidney disease: Secondary | ICD-10-CM | POA: Diagnosis not present

## 2018-05-31 DIAGNOSIS — N390 Urinary tract infection, site not specified: Secondary | ICD-10-CM

## 2018-05-31 LAB — URINE CULTURE
MICRO NUMBER:: 269077
SPECIMEN QUALITY:: ADEQUATE

## 2018-05-31 LAB — URINALYSIS W MICROSCOPIC + REFLEX CULTURE
Bilirubin Urine: NEGATIVE
Glucose, UA: NEGATIVE
Hgb urine dipstick: NEGATIVE
Hyaline Cast: NONE SEEN /LPF
Ketones, ur: NEGATIVE
Nitrites, Initial: NEGATIVE
Protein, ur: NEGATIVE
RBC / HPF: NONE SEEN /HPF (ref 0–2)
Specific Gravity, Urine: 1.016 (ref 1.001–1.03)
Squamous Epithelial / HPF: NONE SEEN /HPF (ref <=5)
WBC, UA: >=60 /HPF — AB (ref 0–5)
pH: 6 (ref 5.0–8.0)

## 2018-05-31 LAB — CULTURE INDICATED

## 2018-05-31 MED ORDER — CIPROFLOXACIN HCL 500 MG PO TABS: ORAL_TABLET | ORAL | 0 refills | Status: DC

## 2018-06-05 DIAGNOSIS — L430 Hypertrophic lichen planus: Secondary | ICD-10-CM | POA: Diagnosis not present

## 2018-06-05 DIAGNOSIS — D485 Neoplasm of uncertain behavior of skin: Secondary | ICD-10-CM | POA: Diagnosis not present

## 2018-06-05 DIAGNOSIS — D0462 Carcinoma in situ of skin of left upper limb, including shoulder: Secondary | ICD-10-CM | POA: Diagnosis not present

## 2018-06-05 DIAGNOSIS — L570 Actinic keratosis: Secondary | ICD-10-CM | POA: Diagnosis not present

## 2018-06-06 ENCOUNTER — Other Ambulatory Visit (INDEPENDENT_AMBULATORY_CARE_PROVIDER_SITE_OTHER): Payer: BLUE CROSS/BLUE SHIELD

## 2018-06-06 DIAGNOSIS — E039 Hypothyroidism, unspecified: Secondary | ICD-10-CM

## 2018-06-06 DIAGNOSIS — R350 Frequency of micturition: Secondary | ICD-10-CM

## 2018-06-06 DIAGNOSIS — E890 Postprocedural hypothyroidism: Secondary | ICD-10-CM

## 2018-06-06 DIAGNOSIS — H2513 Age-related nuclear cataract, bilateral: Secondary | ICD-10-CM | POA: Diagnosis not present

## 2018-06-06 LAB — TSH: TSH: 0.73 mIU/L (ref 0.40–4.50)

## 2018-06-07 ENCOUNTER — Other Ambulatory Visit: Payer: Self-pay | Admitting: Adult Health Nurse Practitioner

## 2018-06-07 DIAGNOSIS — E890 Postprocedural hypothyroidism: Secondary | ICD-10-CM

## 2018-06-07 DIAGNOSIS — E039 Hypothyroidism, unspecified: Secondary | ICD-10-CM

## 2018-06-07 MED ORDER — LEVOTHYROXINE SODIUM 88 MCG PO TABS: ORAL_TABLET | ORAL | 1 refills | Status: DC

## 2018-06-13 ENCOUNTER — Other Ambulatory Visit: Payer: Self-pay | Admitting: Internal Medicine

## 2018-06-15 ENCOUNTER — Other Ambulatory Visit: Payer: Self-pay | Admitting: Adult Health Nurse Practitioner

## 2018-06-15 DIAGNOSIS — E039 Hypothyroidism, unspecified: Secondary | ICD-10-CM

## 2018-06-19 ENCOUNTER — Telehealth: Payer: Self-pay | Admitting: Neurology

## 2018-06-19 NOTE — Telephone Encounter (Signed)
Pt called and message was read to her.

## 2018-06-19 NOTE — Telephone Encounter (Signed)
I called the patient to make her aware of closure. She did not answer but I left a VM asking her to call the office back. If she calls back please advise her of message below. DW

## 2018-06-20 NOTE — Telephone Encounter (Signed)
Noted, thank you

## 2018-06-21 ENCOUNTER — Ambulatory Visit: Payer: Self-pay | Admitting: Neurology

## 2018-06-26 NOTE — Telephone Encounter (Signed)
I called the patient to offer her an apt tomorrow for her injection. She did not answer so I left a VM asking her to call back.

## 2018-06-28 ENCOUNTER — Other Ambulatory Visit: Payer: Self-pay | Admitting: Adult Health

## 2018-07-26 ENCOUNTER — Other Ambulatory Visit: Payer: Self-pay | Admitting: Adult Health

## 2018-07-26 ENCOUNTER — Other Ambulatory Visit: Payer: Self-pay

## 2018-07-26 DIAGNOSIS — F988 Other specified behavioral and emotional disorders with onset usually occurring in childhood and adolescence: Secondary | ICD-10-CM

## 2018-07-26 MED ORDER — AMPHETAMINE-DEXTROAMPHETAMINE 10 MG PO TABS: ORAL_TABLET | ORAL | 0 refills | Status: DC

## 2018-07-26 NOTE — Telephone Encounter (Signed)
Refill request for Adderall. Last rx written on 05/07/18. In que for review.

## 2018-07-27 ENCOUNTER — Ambulatory Visit (INDEPENDENT_AMBULATORY_CARE_PROVIDER_SITE_OTHER): Payer: BLUE CROSS/BLUE SHIELD | Admitting: Internal Medicine

## 2018-07-27 ENCOUNTER — Other Ambulatory Visit: Payer: Self-pay

## 2018-07-27 ENCOUNTER — Encounter: Payer: Self-pay | Admitting: Internal Medicine

## 2018-07-27 VITALS — BP 106/76 | HR 84 | Temp 97.0°F | Resp 16 | Ht 69.0 in | Wt 164.4 lb

## 2018-07-27 DIAGNOSIS — Z8249 Family history of ischemic heart disease and other diseases of the circulatory system: Secondary | ICD-10-CM

## 2018-07-27 DIAGNOSIS — Z136 Encounter for screening for cardiovascular disorders: Secondary | ICD-10-CM

## 2018-07-27 DIAGNOSIS — E890 Postprocedural hypothyroidism: Secondary | ICD-10-CM

## 2018-07-27 DIAGNOSIS — Z1389 Encounter for screening for other disorder: Secondary | ICD-10-CM

## 2018-07-27 DIAGNOSIS — IMO0002 Reserved for concepts with insufficient information to code with codable children: Secondary | ICD-10-CM

## 2018-07-27 DIAGNOSIS — R0989 Other specified symptoms and signs involving the circulatory and respiratory systems: Secondary | ICD-10-CM | POA: Insufficient documentation

## 2018-07-27 DIAGNOSIS — E782 Mixed hyperlipidemia: Secondary | ICD-10-CM

## 2018-07-27 DIAGNOSIS — Z1329 Encounter for screening for other suspected endocrine disorder: Secondary | ICD-10-CM | POA: Diagnosis not present

## 2018-07-27 DIAGNOSIS — Z111 Encounter for screening for respiratory tuberculosis: Secondary | ICD-10-CM | POA: Diagnosis not present

## 2018-07-27 DIAGNOSIS — Z0000 Encounter for general adult medical examination without abnormal findings: Secondary | ICD-10-CM | POA: Diagnosis not present

## 2018-07-27 DIAGNOSIS — Z131 Encounter for screening for diabetes mellitus: Secondary | ICD-10-CM | POA: Diagnosis not present

## 2018-07-27 DIAGNOSIS — R5383 Other fatigue: Secondary | ICD-10-CM

## 2018-07-27 DIAGNOSIS — K219 Gastro-esophageal reflux disease without esophagitis: Secondary | ICD-10-CM

## 2018-07-27 DIAGNOSIS — G43709 Chronic migraine without aura, not intractable, without status migrainosus: Secondary | ICD-10-CM

## 2018-07-27 DIAGNOSIS — Z130 Encounter for screening for diseases of the blood and blood-forming organs and certain disorders involving the immune mechanism: Secondary | ICD-10-CM | POA: Diagnosis not present

## 2018-07-27 DIAGNOSIS — Z13220 Encounter for screening for lipoid disorders: Secondary | ICD-10-CM

## 2018-07-27 DIAGNOSIS — I10 Essential (primary) hypertension: Secondary | ICD-10-CM

## 2018-07-27 DIAGNOSIS — K760 Fatty (change of) liver, not elsewhere classified: Secondary | ICD-10-CM

## 2018-07-27 DIAGNOSIS — E559 Vitamin D deficiency, unspecified: Secondary | ICD-10-CM | POA: Diagnosis not present

## 2018-07-27 DIAGNOSIS — Z1211 Encounter for screening for malignant neoplasm of colon: Secondary | ICD-10-CM

## 2018-07-27 DIAGNOSIS — R7309 Other abnormal glucose: Secondary | ICD-10-CM | POA: Insufficient documentation

## 2018-07-27 DIAGNOSIS — Z79899 Other long term (current) drug therapy: Secondary | ICD-10-CM

## 2018-07-27 DIAGNOSIS — N183 Chronic kidney disease, stage 3 unspecified: Secondary | ICD-10-CM

## 2018-07-27 DIAGNOSIS — Z1212 Encounter for screening for malignant neoplasm of rectum: Secondary | ICD-10-CM

## 2018-07-27 DIAGNOSIS — R7303 Prediabetes: Secondary | ICD-10-CM

## 2018-07-27 DIAGNOSIS — Z8673 Personal history of transient ischemic attack (TIA), and cerebral infarction without residual deficits: Secondary | ICD-10-CM

## 2018-07-27 DIAGNOSIS — I69954 Hemiplegia and hemiparesis following unspecified cerebrovascular disease affecting left non-dominant side: Secondary | ICD-10-CM

## 2018-07-27 DIAGNOSIS — Z0001 Encounter for general adult medical examination with abnormal findings: Secondary | ICD-10-CM

## 2018-07-27 DIAGNOSIS — F988 Other specified behavioral and emotional disorders with onset usually occurring in childhood and adolescence: Secondary | ICD-10-CM

## 2018-07-27 NOTE — Progress Notes (Signed)
[facility] Ruth Gregory, M.D.     Dyanne Carrel. Steffanie Dunn, P.A.-C Judd Gaudier, DNP [facility] [address] [place], [place]. [address] Telephone [phone] Telefax [phone] Annual Screening/Preventative Visit & Comprehensive Evaluation &  Examination  History of Present Illness:        This very nice 58 y.o. MWF RH dominant presents for a Screening /Preventative Visit & comprehensive evaluation and management of multiple medical co-morbidities.  Patient has been followed for HTN, CKD3, HLD, Prediabetes  and Vitamin D Deficiency.      In 2009 she had a Rt MCA CVA  attributed to Car Aa dissection from suspected Fibromuscular Dysplasia and has residua with a mild spastic Lt HP and persistent residual cognitive dysfunction with difficulty focusing and concentration -improved on Adderall. Patient is also followed by Dr Terrace Arabia for Bo-Tox injections to her LUE  as well as for her chronic Migraines.         Patient is followed with mild labile HTN. Patient's BP has been controlled at home and patient denies any cardiac symptoms as chest pain, palpitations, shortness of breath, dizziness or ankle swelling. Today's BP: 106/76      Patient's hyperlipidemia is controlled with diet and medications. Patient denies myalgias or other medication SE's. Last lipids were at goal albeit elevated Trig's: Lab Results  Component Value Date   CHOL 155 07/27/2018   HDL 58 07/27/2018   LDLCALC 71 07/27/2018   TRIG 193 (H) 07/27/2018   CHOLHDL 2.7 07/27/2018      Patient has hx/o prediabetes (A1c 5.8% / 2008)   and patient denies reactive hypoglycemic symptoms, visual blurring, diabetic polys or paresthesias. Last A1c was  Lab Results  Component Value Date   HGBA1C 5.5 07/27/2018      Patient has hx/o Thyroid surgery in 2007 and has been on replacement since.     Finally, patient has history of Vitamin D Deficiency ("23" / 2008)  and  last Vitamin D was at goal: Lab Results  Component Value Date   VD25OH 119 (H) 07/27/2018    Current Outpatient Medications on File Prior to Visit  Medication Sig  . albuterol (PROVENTIL HFA) 108 (90 Base) MCG/ACT inhaler 1 to 2 inhalations 10-15 minutes apart every 4 hours if needed for asthma rescue  . ALPRAZolam (XANAX) 1 MG tablet Take 1/2 tablet to 1 tablet two to three times a day as needed for anxiety. Limit to 5 days a week to avoid addiction.  Marland Kitchen amphetamine-dextroamphetamine (ADDERALL) 10 MG tablet Take 1/2 to 1 tablet 1 or 2 x daily for ADD  . aspirin EC 81 MG tablet Take 81 mg by mouth daily.  . Botulinum Toxin Type A (BOTOX) 200 UNITS SOLR Inject as directed. Change to BOTOX A 100  units  . buPROPion (WELLBUTRIN XL) 300 MG 24 hr tablet TAKE 1 TABLET (300 MG TOTAL) BY MOUTH DAILY. WITH FOOD  . butalbital-acetaminophen-caffeine (FIORICET, ESGIC) 50-325-40 MG tablet Take 1 tablet by mouth daily as needed for headache. Do not refill in less than 30 days,  . cetirizine (ZYRTEC) 10 MG tablet Take 10 mg by mouth daily.  . Diclofenac Potassium (CAMBIA) 50 MG PACK TAKE 1 PACKET AT ONSET OF MIGRAINE. CAN REPEAT IN 2 HR ONCE IF NEEDED. NO MORE THAN 2 DOSES IN 24HR.  . fenofibrate micronized (LOFIBRA) 134 MG capsule TAKE 1 CAPSULE EVERY DAY  . levothyroxine (SYNTHROID, LEVOTHROID) 88 MCG tablet TAKE 1 TABLET BY MOUTH EVERY DAY  .  magnesium oxide (MAG-OX) 400 MG tablet Take 400 mg by mouth 2 (two) times daily.   . montelukast (SINGULAIR) 10 MG tablet TAKE 1 TABLET BY MOUTH EVERYDAY AT BEDTIME  . Multiple Vitamin (MULTIVITAMIN WITH MINERALS) TABS tablet Take 1 tablet by mouth daily.  . nortriptyline (PAMELOR) 25 MG capsule TAKE 2 CAPSULES (50 MG TOTAL) BY MOUTH AT BEDTIME.  Marland Kitchen. omeprazole (PRILOSEC) 40 MG capsule TAKE 1 CAPSULE (40 MG TOTAL) BY MOUTH DAILY. FOR ACID INDIGESTION AND REFLUX  . prochlorperazine (COMPAZINE) 5 MG tablet TAKE 1 TABLET BY MOUTH 3 TIMES A DAY AS NEEDED FOR NAUSEA  .  Riboflavin 100 MG TABS Take 100 mg by mouth. bid  . rosuvastatin (CRESTOR) 40 MG tablet   . topiramate (TOPAMAX) 100 MG tablet Take 1.5 tablets (150 mg total) by mouth daily.  . verapamil (CALAN-SR) 240 MG CR tablet Take 1 tablet every morning on a full stomach for BP & Migraine prevention  . vitamin B-12 (CYANOCOBALAMIN) 1000 MCG tablet Take 1,000 mcg by mouth daily.  . Vitamin D, Ergocalciferol, (DRISDOL) 1.25 MG (50000 UT) CAPS capsule TAKE 1 CAPSULE EVERY DAY   No current facility-administered medications on file prior to visit.    Allergies  Allergen Reactions  . Penicillins Anaphylaxis and Hives  . Imitrex [Sumatriptan]   . Other Swelling    kiwi  . Sulfa Antibiotics Hives  . Zomig [Zolmitriptan]    Past Medical History:  Diagnosis Date  . Anemia   . Anxiety   . Asthma   . Chronic heartburn   . Depression   . Elevated cholesterol    on Crestor  . Headache(784.0)   . Hypertension   . Hypothyroidism    S/P thyroidectomy  . Migraine   . Occlusion and stenosis of carotid artery with cerebral infarction   . Spasm of muscle   . Unspecified cerebral artery occlusion with cerebral infarction   . Vitamin D deficiency    Health Maintenance  Topic Date Due  . COLONOSCOPY  11/02/2008  . PAP SMEAR-Modifier  05/07/2017  . INFLUENZA VACCINE  10/28/2018  . MAMMOGRAM  09/08/2019  . TETANUS/TDAP  05/18/2026  . Hepatitis C Screening  Completed  . HIV Screening  Completed   Immunization History  Administered Date(s) Administered  . Influenza Inj Mdck Quad With Preservative 01/20/2017, 01/09/2018  . Influenza Split 01/24/2014  . Influenza, Seasonal, Injecte, Preservative Fre 02/09/2016  . Influenza-Unspecified 11/28/2011  . PPD Test 04/04/2013, 04/22/2015, 05/18/2016, 06/20/2017, 07/27/2018  . Pneumococcal Polysaccharide-23 03/29/1998  . Td 03/29/2002  . Tdap 05/18/2016   Last Colon - 12/2009 at [facility] & recommended 10 year f/u due Oct 2021  Last MGM -  09/07/2017  Past Surgical History:  Procedure Laterality Date  . AUGMENTATION MAMMAPLASTY Bilateral 2000  . COSMETIC SURGERY    . CYSTOCELE REPAIR N/A 01/08/2013   Procedure: ANTERIOR REPAIR (CYSTOCELE);  Surgeon: Meriel Picaichard M Holland, MD;  Location: [facility];  Service: Gynecology;  Laterality: N/A;  . NASAL SINUS SURGERY    . VAGINAL HYSTERECTOMY     Family History  Problem Relation Age of Onset  . Heart disease Mother   . Peptic Ulcer Disease Mother   . Heart disease Father   . Parkinson's disease Father    Social History   Tobacco Use  . Smoking status: Never Smoker  . Smokeless tobacco: Never Used  Substance Use Topics  . Alcohol use: Yes    Comment: occassionally drinks a glass of wine  . Drug use:  No    ROS Constitutional: Denies fever, chills, weight loss/gain, headaches, insomnia,  night sweats, and change in appetite. Does c/o fatigue. Eyes: Denies redness, blurred vision, diplopia, discharge, itchy, watery eyes.  ENT: Denies discharge, congestion, post nasal drip, epistaxis, sore throat, earache, hearing loss, dental pain, Tinnitus, Vertigo, Sinus pain, snoring.  Cardio: Denies chest pain, palpitations, irregular heartbeat, syncope, dyspnea, diaphoresis, orthopnea, PND, claudication, edema Respiratory: denies cough, dyspnea, DOE, pleurisy, hoarseness, laryngitis, wheezing.  Gastrointestinal: Denies dysphagia, heartburn, reflux, water brash, pain, cramps, nausea, vomiting, bloating, diarrhea, constipation, hematemesis, melena, hematochezia, jaundice, hemorrhoids Genitourinary: Denies dysuria, frequency, urgency, nocturia, hesitancy, discharge, hematuria, flank pain Breast: Breast lumps, nipple discharge, bleeding.  Musculoskeletal: Denies arthralgia, myalgia, stiffness, Jt. Swelling, pain, limp, and strain/sprain. Denies falls. Skin: Denies puritis, rash, hives, warts, acne, eczema, changing in skin lesion Neuro: No weakness, tremor, incoordination, spasms, paresthesia,  pain Psychiatric: Denies confusion, memory loss, sensory loss. Denies Depression. Endocrine: Denies change in weight, skin, hair change, nocturia, and paresthesia, diabetic polys, visual blurring, hyper / hypo glycemic episodes.  Heme/Lymph: No excessive bleeding, bruising, enlarged lymph nodes.  Physical Exam  BP 106/76   Pulse 84   Temp (!) 97 F (36.1 C)   Resp 16   Ht 5\' 9"  (1.753 m)   Wt 164 lb 6.4 oz (74.6 kg)   BMI 24.28 kg/m   General Appearance: Well nourished, well groomed and in no apparent distress.  Eyes: PERRLA, EOMs, conjunctiva no swelling or erythema, normal fundi and vessels. Sinuses: No frontal/maxillary tenderness ENT/Mouth: EACs patent / TMs  nl. Nares clear without erythema, swelling, mucoid exudates. Oral hygiene is good. No erythema, swelling, or exudate. Tongue normal, non-obstructing. Tonsils not swollen or erythematous. Hearing normal.  Neck: Supple, thyroid not palpable. No bruits, nodes or JVD. Respiratory: Respiratory effort normal.  BS equal and clear bilateral without rales, rhonci, wheezing or stridor. Cardio: Heart sounds are normal with regular rate and rhythm and no murmurs, rubs or gallops. Peripheral pulses are normal and equal bilaterally without edema. No aortic or femoral bruits. Chest: symmetric with normal excursions and percussion. Breasts: Symmetric, without lumps, nipple discharge, retractions, or fibrocystic changes.  Abdomen: Flat, soft with bowel sounds active. Nontender, no guarding, rebound, hernias, masses, or organomegaly.  Lymphatics: Non tender without lymphadenopathy.  Genitourinary:  Musculoskeletal: Full ROM except mild flexion contracturing of the distal LUE with hyperreflexia of the LUE. Normal gait. Skin: Warm and dry without rashes, lesions, cyanosis, clubbing or  ecchymosis.  Neuro: Cranial nerves intact, reflexes equal bilaterally. Normal muscle tone, no cerebellar symptoms. Sensation intact.  Pysch: Alert and oriented X  3, normal affect, Insight and Judgment appropriate.   Assessment and Plan  1. Annual Preventative Screening Examination  2. Labile hypertension  - EKG 12-Lead; Future - [place], RETROPERITNL ABD,  LTD; Future - TSH - Magnesium - COMPLETE METABOLIC PANEL WITH GFR - CBC with Differential/Platelet - Microalbumin / creatinine urine ratio - Urinalysis, Routine w reflex microscopic  3. Hyperlipidemia, mixed  - EKG 12-Lead; Future - [place], RETROPERITNL ABD,  LTD; Future - TSH - Lipid panel  4. Abnormal glucose  - EKG 12-Lead; Future - [place], RETROPERITNL ABD,  LTD; Future - Insulin, random - Hemoglobin A1c  5. Vitamin D deficiency  - VITAMIN D 25 Hydroxy   6. CKD (chronic kidney disease) stage 3, GFR 30-59 ml/min (HCC)  - Microalbumin / creatinine urine ratio - Urinalysis, Routine w reflex microscopic  7. Prediabetes  - EKG 12-Lead; Future - [place], RETROPERITNL ABD,  LTD;  Future - Insulin, random - Hemoglobin A1c  8. Gastroesophageal reflux disease  - CBC with Differential/Platelet  9. Postoperative hypothyroidism  10. Spastic hemiplegia of left nondominant side as late effect of cerebrovascular disease, unspecified cerebrovascular disease type (HCC)  11. ADD (attention deficit disorder) without hyperactivity  12. Fatty liver  13. Chronic migraine  14. Screening-pulmonary TB  - TB Skin Test; Future  15. Screening for colorectal cancer  - POC Hemoccult Bld/Stl   16. Screening for ischemic heart disease  - EKG 12-Lead; Future  17. FHx: heart disease  - EKG 12-Lead; Future - [place], RETROPERITNL ABD,  LTD; Future  18. Encounter for abdominal aortic aneurysm (AAA) screening  - [place], RETROPERITNL ABD,  LTD; Future  19. Fatigue  - TSH - CBC with Differential/Platelet - Vitamin B12 - Iron,Total/Total Iron Binding Cap  20. Medication management  - Urinalysis, Routine w reflex microscopic; Future - Microalbumin / creatinine urine ratio; Future - CBC with  Differential/Platelet; Future - COMPLETE METABOLIC PANEL WITH GFR; Future - Magnesium; Future - Lipid panel; Future - TSH; Future - Hemoglobin A1c; Future - Insulin, random; Future - VITAMIN D 25 Hydroxy (Vit-D Deficiency, Fractures); Future - Insulin, random - Hemoglobin A1c - TSH - Lipid panel - Magnesium - COMPLETE METABOLIC PANEL WITH GFR - CBC with Differential/Platelet - Microalbumin / creatinine urine ratio - Urinalysis, Routine w reflex microscopic           Patient was counseled in prudent diet to achieve/maintain BMI less than 25 for weight control, BP monitoring, regular exercise and medications. Discussed med's effects and SE's. Screening labs and tests as requested with regular follow-up as recommended. I discussed the assessment and treatment plan as above with the patient. The patient was provided an opportunity to ask questions and all were answered. The patient agreed with the plan and demonstrated an understanding of the instructions. I provided  over 40 minutes of exam, counseling, chart review and  complex critical decision making was performed     Marinus Maw, MD

## 2018-07-27 NOTE — Patient Instructions (Signed)
.Coronavirus (COVID-19) Are you at risk?  Are you at risk for the Coronavirus (COVID-19)?  To be considered HIGH RISK for Coronavirus (COVID-19), you have to meet the following criteria:  . Traveled to [place], [place], [place], [place] or [place]; or in the [place] to [place], [place], [place]  . or [place]; and have fever, cough, and shortness of breath within the last 2 weeks of travel OR . Been in close contact with a person diagnosed with COVID-19 within the last 2 weeks and have  . fever, cough,and shortness of breath .  . IF YOU DO NOT MEET THESE CRITERIA, YOU ARE CONSIDERED LOW RISK FOR COVID-19.  What to do if you are HIGH RISK for COVID-19?  Marland Kitchen If you are having a medical emergency, call 911. . Seek medical care right away. Before you go to a doctor's office, urgent care or emergency department, .  call ahead and tell them about your recent travel, contact with someone diagnosed with COVID-19  .  and your symptoms.  . You should receive instructions from your physician's office regarding next steps of care.  . When you arrive at healthcare provider, tell the healthcare staff immediately you have returned from  . visiting [place], [place], [place], [place] or [place]; or traveled in the [place] to [place], [place],  . [place] or [place] in the last two weeks or you have been in close contact with a person diagnosed with  . COVID-19 in the last 2 weeks.   . Tell the health care staff about your symptoms: fever, cough and shortness of breath. . After you have been seen by a medical provider, you will be either: o Tested for (COVID-19) and discharged home on quarantine except to seek medical care if  o symptoms worsen, and asked to  - Stay home and avoid contact with others until you get your results (4-5 days)  - Avoid travel on public transportation if possible (such as bus, train, or airplane) or o Sent to the Emergency Department by EMS for evaluation,  COVID-19 testing  and  o possible admission depending on your condition and test results.  What to do if you are LOW RISK for COVID-19?  Reduce your risk of any infection by using the same precautions used for avoiding the common cold or flu:  Marland Kitchen Wash your hands often with soap and warm water for at least 20 seconds.  If soap and water are not readily available,  . use an alcohol-based hand sanitizer with at least 60% alcohol.  . If coughing or sneezing, cover your mouth and nose by coughing or sneezing into the elbow areas of your shirt or coat, .  into a tissue or into your sleeve (not your hands). . Avoid shaking hands with others and consider head nods or verbal greetings only. . Avoid touching your eyes, nose, or mouth with unwashed hands.  . Avoid close contact with people who are sick. . Avoid places or events with large numbers of people in one location, like concerts or sporting events. . Carefully consider travel plans you have or are making. . If you are planning any travel outside or inside the [place], visit the CDC's Travelers' Health webpage for the latest health notices. . If you have some symptoms but not all symptoms, continue to monitor at home and seek medical attention  . if your symptoms worsen. . If you are having a medical emergency, call 911. >>>>>>>>>>>>>>>>>>>>>>>  Preventive Care for Adults  A healthy lifestyle and preventive care can promote health and wellness. Preventive health guidelines for women include the following key practices.  A routine yearly physical is a good way to check with your health care provider about your health and preventive screening. It is a chance to share any concerns and updates on your health and to receive a thorough exam.  Visit your dentist for a routine exam and preventive care every 6 months. Brush your teeth twice a day and floss once a day. Good oral hygiene prevents tooth decay and gum disease.  The frequency of eye exams is  based on your age, health, family medical history, use of contact lenses, and other factors. Follow your health care provider's recommendations for frequency of eye exams.  Eat a healthy diet. Foods like vegetables, fruits, whole grains, low-fat dairy products, and lean protein foods contain the nutrients you need without too many calories. Decrease your intake of foods high in solid fats, added sugars, and salt. Eat the right amount of calories for you. Get information about a proper diet from your health care provider, if necessary.  Regular physical exercise is one of the most important things you can do for your health. Most adults should get at least 150 minutes of moderate-intensity exercise (any activity that increases your heart rate and causes you to sweat) each week. In addition, most adults need muscle-strengthening exercises on 2 or more days a week.  Maintain a healthy weight. The body mass index (BMI) is a screening tool to identify possible weight problems. It provides an estimate of body fat based on height and weight. Your health care provider can find your BMI and can help you achieve or maintain a healthy weight. For adults 20 years and older:  A BMI below 18.5 is considered underweight.  A BMI of 18.5 to 24.9 is normal.  A BMI of 25 to 29.9 is considered overweight.  A BMI of 30 and above is considered obese.  Maintain normal blood lipids and cholesterol levels by exercising and minimizing your intake of saturated fat. Eat a balanced diet with plenty of fruit and vegetables. Blood tests for lipids and cholesterol should begin at age 29 and be repeated every 5 years. If your lipid or cholesterol levels are high, you are over 50, or you are at high risk for heart disease, you may need your cholesterol levels checked more frequently. Ongoing high lipid and cholesterol levels should be treated with medicines if diet and exercise are not working.  If you smoke, find out from your  health care provider how to quit. If you do not use tobacco, do not start.  Lung cancer screening is recommended for adults aged 53-80 years who are at high risk for developing lung cancer because of a history of smoking. A yearly low-dose CT scan of the lungs is recommended for people who have at least a 30-pack-year history of smoking and are a current smoker or have quit within the past 15 years. A pack year of smoking is smoking an average of 1 pack of cigarettes a day for 1 year (for example: 1 pack a day for 30 years or 2 packs a day for 15 years). Yearly screening should continue until the smoker has stopped smoking for at least 15 years. Yearly screening should be stopped for people who develop a health problem that would prevent them from having lung cancer treatment.  High blood pressure causes heart disease and  increases the risk of stroke. Your blood pressure should be checked at least every 1 to 2 years. Ongoing high blood pressure should be treated with medicines if weight loss and exercise do not work.  If you are 31-58 years old, ask your health care provider if you should take aspirin to prevent strokes.  Diabetes screening involves taking a blood sample to check your fasting blood sugar level. This should be done once every 3 years, after age 62, if you are within normal weight and without risk factors for diabetes. Testing should be considered at a younger age or be carried out more frequently if you are overweight and have at least 1 risk factor for diabetes.  Breast cancer screening is essential preventive care for women. You should practice "breast self-awareness." This means understanding the normal appearance and feel of your breasts and may include breast self-examination. Any changes detected, no matter how small, should be reported to a health care provider. Women in their 74s and 30s should have a clinical breast exam (CBE) by a health care provider as part of a regular health  exam every 1 to 3 years. After age 67, women should have a CBE every year. Starting at age 21, women should consider having a mammogram (breast X-ray test) every year. Women who have a family history of breast cancer should talk to their health care provider about genetic screening. Women at a high risk of breast cancer should talk to their health care providers about having an MRI and a mammogram every year.  Breast cancer gene (BRCA)-related cancer risk assessment is recommended for women who have family members with BRCA-related cancers. BRCA-related cancers include breast, ovarian, tubal, and peritoneal cancers. Having family members with these cancers may be associated with an increased risk for harmful changes (mutations) in the breast cancer genes BRCA1 and BRCA2. Results of the assessment will determine the need for genetic counseling and BRCA1 and BRCA2 testing.  Routine pelvic exams to screen for cancer are no longer recommended for nonpregnant women who are considered low risk for cancer of the pelvic organs (ovaries, uterus, and vagina) and who do not have symptoms. Ask your health care provider if a screening pelvic exam is right for you.  If you have had past treatment for cervical cancer or a condition that could lead to cancer, you need Pap tests and screening for cancer for at least 20 years after your treatment. If Pap tests have been discontinued, your risk factors (such as having a new sexual partner) need to be reassessed to determine if screening should be resumed. Some women have medical problems that increase the chance of getting cervical cancer. In these cases, your health care provider may recommend more frequent screening and Pap tests.  Colorectal cancer can be detected and often prevented. Most routine colorectal cancer screening begins at the age of 61 years and continues through age 35 years. However, your health care provider may recommend screening at an earlier age if you  have risk factors for colon cancer. On a yearly basis, your health care provider may provide home test kits to check for hidden blood in the stool. Use of a small camera at the end of a tube, to directly examine the colon (sigmoidoscopy or colonoscopy), can detect the earliest forms of colorectal cancer. Talk to your health care provider about this at age 61, when routine screening begins.  Direct exam of the colon should be repeated every 5-10 years through age 32 years,  unless early forms of pre-cancerous polyps or small growths are found.  Hepatitis C blood testing is recommended for all people born from [birth year] through 1965 and any individual with known risks for hepatitis C.  Pra  Osteoporosis is a disease in which the bones lose minerals and strength with aging. This can result in serious bone fractures or breaks. The risk of osteoporosis can be identified using a bone density scan. Women ages 27 years and over and women at risk for fractures or osteoporosis should discuss screening with their health care providers. Ask your health care provider whether you should take a calcium supplement or vitamin D to reduce the rate of osteoporosis.  Menopause can be associated with physical symptoms and risks. Hormone replacement therapy is available to decrease symptoms and risks. You should talk to your health care provider about whether hormone replacement therapy is right for you.  Use sunscreen. Apply sunscreen liberally and repeatedly throughout the day. You should seek shade when your shadow is shorter than you. Protect yourself by wearing long sleeves, pants, a wide-brimmed hat, and sunglasses year round, whenever you are outdoors.  Once a month, do a whole body skin exam, using a mirror to look at the skin on your back. Tell your health care provider of new moles, moles that have irregular borders, moles that are larger than a pencil eraser, or moles that have changed in shape or color.  Stay current  with required vaccines (immunizations).  Influenza vaccine. All adults should be immunized every year.  Tetanus, diphtheria, and acellular pertussis (Td, Tdap) vaccine. Pregnant women should receive 1 dose of Tdap vaccine during each pregnancy. The dose should be obtained regardless of the length of time since the last dose. Immunization is preferred during the 27th-36th week of gestation. An adult who has not previously received Tdap or who does not know her vaccine status should receive 1 dose of Tdap. This initial dose should be followed by tetanus and diphtheria toxoids (Td) booster doses every 10 years. Adults with an unknown or incomplete history of completing a 3-dose immunization series with Td-containing vaccines should begin or complete a primary immunization series including a Tdap dose. Adults should receive a Td booster every 10 years.  Varicella vaccine. An adult without evidence of immunity to varicella should receive 2 doses or a second dose if she has previously received 1 dose. Pregnant females who do not have evidence of immunity should receive the first dose after pregnancy. This first dose should be obtained before leaving the health care facility. The second dose should be obtained 4-8 weeks after the first dose.  Human papillomavirus (HPV) vaccine. Females aged 13-26 years who have not received the vaccine previously should obtain the 3-dose series. The vaccine is not recommended for use in pregnant females. However, pregnancy testing is not needed before receiving a dose. If a female is found to be pregnant after receiving a dose, no treatment is needed. In that case, the remaining doses should be delayed until after the pregnancy. Immunization is recommended for any person with an immunocompromised condition through the age of 79 years if she did not get any or all doses earlier. During the 3-dose series, the second dose should be obtained 4-8 weeks after the first dose. The third  dose should be obtained 24 weeks after the first dose and 16 weeks after the second dose.  Zoster vaccine. One dose is recommended for adults aged 83 years or older unless certain conditions are present.  Measles, mumps, and rubella (MMR) vaccine. Adults born before 75 generally are considered immune to measles and mumps. Adults born in [birth year] or later should have 1 or more doses of MMR vaccine unless there is a contraindication to the vaccine or there is laboratory evidence of immunity to each of the three diseases. A routine second dose of MMR vaccine should be obtained at least 28 days after the first dose for students attending postsecondary schools, health care workers, or international travelers. People who received inactivated measles vaccine or an unknown type of measles vaccine during 1963-1967 should receive 2 doses of MMR vaccine. People who received inactivated mumps vaccine or an unknown type of mumps vaccine before 1979 and are at high risk for mumps infection should consider immunization with 2 doses of MMR vaccine. For females of childbearing age, rubella immunity should be determined. If there is no evidence of immunity, females who are not pregnant should be vaccinated. If there is no evidence of immunity, females who are pregnant should delay immunization until after pregnancy. Unvaccinated health care workers born before [birth year] who lack laboratory evidence of measles, mumps, or rubella immunity or laboratory confirmation of disease should consider measles and mumps immunization with 2 doses of MMR vaccine or rubella immunization with 1 dose of MMR vaccine.  Pneumococcal 13-valent conjugate (PCV13) vaccine. When indicated, a person who is uncertain of her immunization history and has no record of immunization should receive the PCV13 vaccine. An adult aged 28 years or older who has certain medical conditions and has not been previously immunized should receive 1 dose of PCV13 vaccine. This  PCV13 should be followed with a dose of pneumococcal polysaccharide (PPSV23) vaccine. The PPSV23 vaccine dose should be obtained at least 1 or more year(s) after the dose of PCV13 vaccine. An adult aged 19 years or older who has certain medical conditions and previously received 1 or more doses of PPSV23 vaccine should receive 1 dose of PCV13. The PCV13 vaccine dose should be obtained 1 or more years after the last PPSV23 vaccine dose.    Pneumococcal polysaccharide (PPSV23) vaccine. When PCV13 is also indicated, PCV13 should be obtained first. All adults aged 34 years and older should be immunized. An adult younger than age 2 years who has certain medical conditions should be immunized. Any person who resides in a nursing home or long-term care facility should be immunized. An adult smoker should be immunized. People with an immunocompromised condition and certain other conditions should receive both PCV13 and PPSV23 vaccines. People with human immunodeficiency virus (HIV) infection should be immunized as soon as possible after diagnosis. Immunization during chemotherapy or radiation therapy should be avoided. Routine use of PPSV23 vaccine is not recommended for American Indians, [place] Natives, or people younger than 65 years unless there are medical conditions that require PPSV23 vaccine. When indicated, people who have unknown immunization and have no record of immunization should receive PPSV23 vaccine. One-time revaccination 5 years after the first dose of PPSV23 is recommended for people aged 19-64 years who have chronic kidney failure, nephrotic syndrome, asplenia, or immunocompromised conditions. People who received 1-2 doses of PPSV23 before age 68 years should receive another dose of PPSV23 vaccine at age 43 years or later if at least 5 years have passed since the previous dose. Doses of PPSV23 are not needed for people immunized with PPSV23 at or after age 68 years.  Preventive Services /  Frequency   Ages 57 to 70 years  Blood pressure check.  Lipid and cholesterol check.  Lung cancer screening. / Every year if you are aged 31-80 years and have a 30-pack-year history of smoking and currently smoke or have quit within the past 15 years. Yearly screening is stopped once you have quit smoking for at least 15 years or develop a health problem that would prevent you from having lung cancer treatment.  Clinical breast exam.** / Every year after age 25 years.   BRCA-related cancer risk assessment.** / For women who have family members with a BRCA-related cancer (breast, ovarian, tubal, or peritoneal cancers).  Mammogram.** / Every year beginning at age 76 years and continuing for as long as you are in good health. Consult with your health care provider.  Pap test.** / Every 3 years starting at age 63 years through age 76 or 5 years with a history of 3 consecutive normal Pap tests.  HPV screening.** / Every 3 years from ages 81 years through ages 41 to 58 years with a history of 3 consecutive normal Pap tests.  Fecal occult blood test (FOBT) of stool. / Every year beginning at age 4 years and continuing until age 84 years. You may not need to do this test if you get a colonoscopy every 10 years.  Flexible sigmoidoscopy or colonoscopy.** / Every 5 years for a flexible sigmoidoscopy or every 10 years for a colonoscopy beginning at age 15 years and continuing until age 60 years.  Hepatitis C blood test.** / For all people born from [birth year] through 1967 and any individual with known risks for hepatitis C.  Skin self-exam. / Monthly.  Influenza vaccine. / Every year.  Tetanus, diphtheria, and acellular pertussis (Tdap/Td) vaccine.** / Consult your health care provider. Pregnant women should receive 1 dose of Tdap vaccine during each pregnancy. 1 dose of Td every 10 years.  Varicella vaccine.** / Consult your health care provider. Pregnant females who do not have evidence of  immunity should receive the first dose after pregnancy.  Zoster vaccine.** / 1 dose for adults aged 62 years or older.  Pneumococcal 13-valent conjugate (PCV13) vaccine.** / Consult your health care provider.  Pneumococcal polysaccharide (PPSV23) vaccine.** / 1 to 2 doses if you smoke cigarettes or if you have certain conditions.  Meningococcal vaccine.** / Consult your health care provider.  Hepatitis A vaccine.** / Consult your health care provider.  Hepatitis B vaccine.** / Consult your health care provider. Screening for abdominal aortic aneurysm (AAA)  by ultrasound is recommended for people over 50 who have history of high blood pressure or who are current or former smokers. ++++++++++++++++++ Recommend Adult Low Dose Aspirin or  coated  Aspirin 81 mg daily  To reduce risk of Colon Cancer 20 %,  Skin Cancer 26 % ,  Melanoma 46%  and  Pancreatic cancer 60% +++++++++++++++++++ Vitamin D goal  is between 70-100.  Please make sure that you are taking your Vitamin D as directed.  It is very important as a natural anti-inflammatory  helping hair, skin, and nails, as well as reducing stroke and heart attack risk.  It helps your bones and helps with mood. It also decreases numerous cancer risks so please take it as directed.  Low Vit D is associated with a 200-300% higher risk for CANCER  and 200-300% higher risk for HEART   ATTACK  &  STROKE.   .....................................Marland Kitchen It is also associated with higher death rate at younger ages,  autoimmune diseases like Rheumatoid arthritis, Lupus, Multiple Sclerosis.    Also  many other serious conditions, like depression, Alzheimer's Dementia, infertility, muscle aches, fatigue, fibromyalgia - just to name a few. ++++++++++++++++++ Recommend the book "The END of DIETING" by Dr Excell Seltzer  & the book "The END of DIABETES " by Dr Excell Seltzer At [email or web address] - get book & Audio CD's    Being diabetic has a  300% increased risk  for heart attack, stroke, cancer, and alzheimer- type vascular dementia. It is very important that you work harder with diet by avoiding all foods that are white. Avoid white rice (brown & wild rice is OK), white potatoes (sweetpotatoes in moderation is OK), White bread or wheat bread or anything made out of white flour like bagels, donuts, rolls, buns, biscuits, cakes, pastries, cookies, pizza crust, and pasta (made from white flour & egg whites) - vegetarian pasta or spinach or wheat pasta is OK. Multigrain breads like Arnold's or Pepper[employer], or multigrain sandwich thins or flatbreads.  Diet, exercise and weight loss can reverse and cure diabetes in the early stages.  Diet, exercise and weight loss is very important in the control and prevention of complications of diabetes which affects every system in your body, ie. Brain - dementia/stroke, eyes - glaucoma/blindness, heart - heart attack/heart failure, kidneys - dialysis, stomach - gastric paralysis, intestines - malabsorption, nerves - severe painful neuritis, circulation - gangrene & loss of a leg(s), and finally cancer and Alzheimers.    I recommend avoid fried & greasy foods,  sweets/candy, white rice (brown or wild rice or Quinoa is OK), white potatoes (sweet potatoes are OK) - anything made from white flour - bagels, doughnuts, rolls, buns, biscuits,white and wheat breads, pizza crust and traditional pasta made of white flour & egg white(vegetarian pasta or spinach or wheat pasta is OK).  Multi-grain bread is OK - like multi-grain flat bread or sandwich thins. Avoid alcohol in excess. Exercise is also important.    Eat all the vegetables you want - avoid meat, especially red meat and dairy - especially cheese.  Cheese is the most concentrated form of trans-fats which is the worst thing to clog up our arteries. Veggie cheese is OK which can be found in the fresh produce section at [employer] or [employer] or  [employer]  ++++++++++++++++++++++ DASH Eating Plan  DASH stands for "Dietary Approaches to Stop Hypertension."   The DASH eating plan is a healthy eating plan that has been shown to reduce high blood pressure (hypertension). Additional health benefits may include reducing the risk of type 2 diabetes mellitus, heart disease, and stroke. The DASH eating plan may also help with weight loss. WHAT DO I NEED TO KNOW ABOUT THE DASH EATING PLAN? For the DASH eating plan, you will follow these general guidelines:  Choose foods with a percent daily value for sodium of less than 5% (as listed on the food label).  Use salt-free seasonings or herbs instead of table salt or sea salt.  Check with your health care provider or pharmacist before using salt substitutes.  Eat lower-sodium products, often labeled as "lower sodium" or "no salt added."  Eat fresh foods.  Eat more vegetables, fruits, and low-fat dairy products.  Choose whole grains. Look for the word "whole" as the first word in the ingredient list.  Choose fish   Limit sweets, desserts, sugars, and sugary drinks.  Choose heart-healthy fats.  Eat veggie cheese   Eat more home-cooked food and less restaurant, buffet, and fast food.  Limit fried foods.  Cook foods using  methods other than frying.  Limit canned vegetables. If you do use them, rinse them well to decrease the sodium.  When eating at a restaurant, ask that your food be prepared with less salt, or no salt if possible.                      WHAT FOODS CAN I EAT? Read Dr Fara Olden Fuhrman's books on The End of Dieting & The End of Diabetes  Grains Whole grain or whole wheat bread. Brown rice. Whole grain or whole wheat pasta. Quinoa, bulgur, and whole grain cereals. Low-sodium cereals. Corn or whole wheat flour tortillas. Whole grain cornbread. Whole grain crackers. Low-sodium crackers.  Vegetables Fresh or frozen vegetables (raw, steamed, roasted, or grilled). Low-sodium  or reduced-sodium tomato and vegetable juices. Low-sodium or reduced-sodium tomato sauce and paste. Low-sodium or reduced-sodium canned vegetables.   Fruits All fresh, canned (in natural juice), or frozen fruits.  Protein Products  All fish and seafood.  Dried beans, peas, or lentils. Unsalted nuts and seeds. Unsalted canned beans.  Dairy Low-fat dairy products, such as skim or 1% milk, 2% or reduced-fat cheeses, low-fat ricotta or cottage cheese, or plain low-fat yogurt. Low-sodium or reduced-sodium cheeses.  Fats and Oils Tub margarines without trans fats. Light or reduced-fat mayonnaise and salad dressings (reduced sodium). Avocado. Safflower, olive, or canola oils. Natural peanut or almond butter.  Other Unsalted popcorn and pretzels. The items listed above may not be a complete list of recommended foods or beverages. Contact your dietitian for more options.  ++++++++++++++++++  WHAT FOODS ARE NOT RECOMMENDED? Grains/ White flour or wheat flour White bread. White pasta. White rice. Refined cornbread. Bagels and croissants. Crackers that contain trans fat.  Vegetables  Creamed or fried vegetables. Vegetables in a . Regular canned vegetables. Regular canned tomato sauce and paste. Regular tomato and vegetable juices.  Fruits Dried fruits. Canned fruit in light or heavy syrup. Fruit juice.  Meat and Other Protein Products Meat in general - RED meat & White meat.  Fatty cuts of meat. Ribs, chicken wings, all processed meats as bacon, sausage, bologna, salami, fatback, hot dogs, bratwurst and packaged luncheon meats.  Dairy Whole or 2% milk, cream, half-and-half, and cream cheese. Whole-fat or sweetened yogurt. Full-fat cheeses or blue cheese. Non-dairy creamers and whipped toppings. Processed cheese, cheese spreads, or cheese curds.  Condiments Onion and garlic salt, seasoned salt, table salt, and sea salt. Canned and packaged gravies. Worcestershire sauce. Tartar sauce.  Barbecue sauce. Teriyaki sauce. Soy sauce, including reduced sodium. Steak sauce. Fish sauce. Oyster sauce. Cocktail sauce. Horseradish. Ketchup and mustard. Meat flavorings and tenderizers. Bouillon cubes. Hot sauce. Tabasco sauce. Marinades. Taco seasonings. Relishes.  Fats and Oils Butter, stick margarine, lard, shortening and bacon fat. Coconut, palm kernel, or palm oils. Regular salad dressings.  Pickles and olives. Salted popcorn and pretzels.  The items listed above may not be a complete list of foods and beverages to avoid.

## 2018-07-28 ENCOUNTER — Encounter: Payer: Self-pay | Admitting: Internal Medicine

## 2018-07-28 LAB — CBC WITH DIFFERENTIAL/PLATELET
Absolute Monocytes: 297 cells/uL (ref 200–950)
Basophils Absolute: 50 cells/uL (ref 0–200)
Basophils Relative: 0.9 %
Eosinophils Absolute: 121 cells/uL (ref 15–500)
Eosinophils Relative: 2.2 %
HCT: 44.9 % (ref 35.0–45.0)
Hemoglobin: 15.6 g/dL — ABNORMAL HIGH (ref 11.7–15.5)
Lymphs Abs: 1716 cells/uL (ref 850–3900)
MCH: 32.6 pg (ref 27.0–33.0)
MCHC: 34.7 g/dL (ref 32.0–36.0)
MCV: 93.7 fL (ref 80.0–100.0)
MPV: 11.9 fL (ref 7.5–12.5)
Monocytes Relative: 5.4 %
Neutro Abs: 3317 cells/uL (ref 1500–7800)
Neutrophils Relative %: 60.3 %
Platelets: 168 10*3/uL (ref 140–400)
RBC: 4.79 10*6/uL (ref 3.80–5.10)
RDW: 13 % (ref 11.0–15.0)
Total Lymphocyte: 31.2 %
WBC: 5.5 10*3/uL (ref 3.8–10.8)

## 2018-07-28 LAB — LIPID PANEL
Cholesterol: 155 mg/dL (ref <200)
HDL: 58 mg/dL (ref >=50)
LDL Cholesterol (Calc): 71 mg/dL (calc)
Non-HDL Cholesterol (Calc): 97 mg/dL (calc) (ref <130)
Total CHOL/HDL Ratio: 2.7 (calc) (ref <5.0)
Triglycerides: 193 mg/dL — ABNORMAL HIGH (ref <150)

## 2018-07-28 LAB — MAGNESIUM: Magnesium: 1.9 mg/dL (ref 1.5–2.5)

## 2018-07-28 LAB — COMPLETE METABOLIC PANEL WITH GFR
AG Ratio: 2.1 (calc) (ref 1.0–2.5)
ALT: 37 U/L — ABNORMAL HIGH (ref 6–29)
AST: 23 U/L (ref 10–35)
Albumin: 4.7 g/dL (ref 3.6–5.1)
Alkaline phosphatase (APISO): 91 U/L (ref 37–153)
BUN: 17 mg/dL (ref 7–25)
CO2: 25 mmol/L (ref 20–32)
Calcium: 9.4 mg/dL (ref 8.6–10.4)
Chloride: 109 mmol/L (ref 98–110)
Creat: 0.94 mg/dL (ref 0.50–1.05)
GFR, Est African American: 77 mL/min/{1.73_m2} (ref >=60)
GFR, Est Non African American: 66 mL/min/{1.73_m2} (ref >=60)
Globulin: 2.2 g/dL (calc) (ref 1.9–3.7)
Glucose, Bld: 92 mg/dL (ref 65–99)
Potassium: 3.7 mmol/L (ref 3.5–5.3)
Sodium: 141 mmol/L (ref 135–146)
Total Bilirubin: 0.4 mg/dL (ref 0.2–1.2)
Total Protein: 6.9 g/dL (ref 6.1–8.1)

## 2018-07-28 LAB — TSH: TSH: 3.04 mIU/L (ref 0.40–4.50)

## 2018-07-28 LAB — VITAMIN D 25 HYDROXY (VIT D DEFICIENCY, FRACTURES): Vit D, 25-Hydroxy: 119 ng/mL — ABNORMAL HIGH (ref 30–100)

## 2018-07-28 LAB — VITAMIN B12: Vitamin B-12: 316 pg/mL (ref 200–1100)

## 2018-07-28 LAB — IRON, TOTAL/TOTAL IRON BINDING CAP
%SAT: 32 % (calc) (ref 16–45)
Iron: 93 ug/dL (ref 45–160)
TIBC: 287 mcg/dL (calc) (ref 250–450)

## 2018-07-28 LAB — HEMOGLOBIN A1C
Hgb A1c MFr Bld: 5.5 % of total Hgb (ref <5.7)
Mean Plasma Glucose: 111 (calc)
eAG (mmol/L): 6.2 (calc)

## 2018-07-28 LAB — INSULIN, RANDOM: Insulin: 64.5 u[IU]/mL — ABNORMAL HIGH

## 2018-08-07 NOTE — Telephone Encounter (Signed)
I called the patient to schedule but she did not answer so I left a VM asking her to call me back.  °

## 2018-08-12 ENCOUNTER — Other Ambulatory Visit: Payer: Self-pay | Admitting: Adult Health

## 2018-08-15 DIAGNOSIS — Z0289 Encounter for other administrative examinations: Secondary | ICD-10-CM

## 2018-08-15 NOTE — Addendum Note (Signed)
Addended by: Lucky Cowboy on: 08/15/2018 11:12 AM   Modules accepted: Orders

## 2018-08-22 ENCOUNTER — Telehealth: Payer: Self-pay | Admitting: *Deleted

## 2018-08-22 NOTE — Telephone Encounter (Signed)
Pt [employer] form @ front desk for p/u

## 2018-08-23 ENCOUNTER — Ambulatory Visit (INDEPENDENT_AMBULATORY_CARE_PROVIDER_SITE_OTHER): Payer: BLUE CROSS/BLUE SHIELD | Admitting: Neurology

## 2018-08-23 ENCOUNTER — Encounter: Payer: Self-pay | Admitting: Neurology

## 2018-08-23 ENCOUNTER — Telehealth: Payer: Self-pay | Admitting: Neurology

## 2018-08-23 ENCOUNTER — Other Ambulatory Visit: Payer: Self-pay

## 2018-08-23 VITALS — BP 140/85 | HR 93 | Temp 97.3°F | Ht 69.0 in | Wt 164.5 lb

## 2018-08-23 DIAGNOSIS — G43709 Chronic migraine without aura, not intractable, without status migrainosus: Secondary | ICD-10-CM

## 2018-08-23 DIAGNOSIS — I69954 Hemiplegia and hemiparesis following unspecified cerebrovascular disease affecting left non-dominant side: Secondary | ICD-10-CM | POA: Diagnosis not present

## 2018-08-23 DIAGNOSIS — IMO0002 Reserved for concepts with insufficient information to code with codable children: Secondary | ICD-10-CM

## 2018-08-23 MED ORDER — ONABOTULINUMTOXINA 100 UNITS IJ SOLR
400.0000 [IU] | Freq: Once | INTRAMUSCULAR | Status: AC
  Administered 2018-08-23: 400 [IU] via INTRAMUSCULAR

## 2018-08-23 NOTE — Telephone Encounter (Signed)
I called and spoke with the patient to schedule the next apt. DW

## 2018-08-23 NOTE — Progress Notes (Signed)
Chief Complaint  Patient presents with  . Spasticity/Migraine    Botox 100 units x 4 vials - office supply      PATIENT: Ruth Gregory DOB: [birth year]  Chief Complaint  Patient presents with  . Spasticity/Migraine    Botox 100 units x 4 vials - office supply     HISTORICAL  Ruth Gregory is a 58 year old right-handed female  History of right internal carotid section with spastic left hemiparesis, Botox injection every 3 months  She has history of right internal carotid artery dissection following an episode of vomiting from migraine headache in mid June 2009. She was given IV TPA and found to have right MCA occlusion. She underwent emergent right carotid artery stent with distal endovascular recanalization of the middle cerebral artery.  She had small hemorrhagic transformation of the right basal ganglia with mild left hemiparesis, but has done well since then and has been independent.  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  She has been receiving BOTOX injection since 01/2009 every 3 months, to her left upper extremity and also left lower extremity, responded well.  Repeat US carotid were normal in October 2013  Chronic migraine headaches: She had long-standing history of chronic migraine, average 2-4 headaches each months, she has increased frequency headaches around fall, and spring, she is now taking Topamax ER 150 mg every night as preventive medication, magnesium oxide 400 mg,  riboflavin 100 mg twice a day,  She is not candidate for triptan because previous history of stroke, Cambia works well for her, she her insurance only allow her to have 9 tablets each months. Occasionally she take Fioricet, hydrocodone as needed, even prednisone 20 mg as needed as rescue therapy  Over the past few weeks, she been having to 3 headaches each week, used out her cambia, she also complains of neck muscle tightness, receiving chiropractor sometimes   Last Botox injection to spastic left upper extremity works well, but she noticed left hand muscle weakness.  UPDATE Mar 26 2015: She responded very well to previous Botox injection in September 2016, but noticed increased left hand weakness, gradually recovered over the past few weeks  Update July 15 2015: She responded very well to previous Botox injection in December 20 eighth 2016, noticed returning of left finger flexion spasticity, left ankle plantarflexion, want today's injection emphasize on above abnormal posturing.  She still has frequent migraine headaches, 1-2 headache each week, some headache was protracted, lasting 1-2 days, require multiple dose of cambia and Fioricet  Update November 20 2015: Patient called in September 09 2015 "She also said the left hand is very weak- 12,3 digits > 4,5. She said last OV she rec'd botox in wrist. She sts she may not be able to get botox next month if she is not feeling better"  She has a lot of pain on her left leg, mainly her left ankle from knee down, throbbing, tooth ache, she noticed more tendency of left ankle plantar flexion.  She continue have intermittent migraine  headaches, more at the left retro-orbital  Area  UPDATE Dec 6th 2017: She responded very well to previous Botox injection, complains frequent migraine headaches, once a week, lasting 1-2 days, partial response to combination of cambia, Fioricet, Compazine, she is taking Topamax ER 150 mg as preventive medications  UPDATE  June 30 2016: She had her first Botox injection for migraine prevention in December 2017, with only used 75 units she reported dramatic improvement, she only has migraines about once a week, which is milder, better controlled by the medication, instead of 2-3 times more severe headaches, at the end of the benefit, she noticed recurrent of her headaches, she also noticed mild left shoulder stiffness, left ankle turned in,  She noticed worsening right knee pain, recently had right knee injection.  Update November 04 2016: She responded well to previous injection, Botox injection to bilateral frontal temporal region has helped her headache,  UPDATE Mar 11 2017: She responded well to previous injection in August 2018.  Reported increased migraine headache over the past few weeks,  UPDATE Aug 08 2017: She is having migraine almost every week, lasting for 2 days, she is hesitate   UPDATE Sept 30 2019: She missed her appointment in August, she was given toradol and pheneran by pcp recently for protracted migraine headache, which did help her headache, but less effective than Depacon  She continue have 1 or 2 migraine headaches 1 to 2 weeks, also noticed increased left ankle spasticity, unsteady gait  UPDATE Mar 23 2018: She still has frequent headaches, want to explore the possibility of CGRP, responded very well to previous injection,  UPDATE Aug 23 2018: She responded very well to previous injection, only has occasional migraine headaches  Review of system: Pertinent as above   ALLERGIES: Allergies  Allergen Reactions  . Penicillins Anaphylaxis and Hives  . Imitrex [Sumatriptan]   . Other Swelling    kiwi  . Sulfa Antibiotics Hives  . Zomig [Zolmitriptan]     HOME MEDICATIONS: Current Outpatient Medications  Medication Sig Dispense Refill  . albuterol (PROVENTIL HFA) 108 (90 Base) MCG/ACT inhaler 1 to 2 inhalations 10-15 minutes apart every 4 hours if needed for asthma rescue 48 g 3   . ALPRAZolam (XANAX) 1 MG tablet Take 1/2 tablet to 1 tablet two to three times a day as needed for anxiety. Limit to 5 days a week to avoid addiction. 90 tablet 0  . amphetamine-dextroamphetamine (ADDERALL) 10 MG tablet Take 1/2 to 1 tablet 1 or 2 x daily for ADD 60 tablet 0  . aspirin EC 81 MG tablet Take 81 mg by mouth daily.    . Botulinum Toxin Type A (BOTOX) 200 UNITS SOLR Inject as directed. Change to BOTOX A 100  units    . buPROPion (WELLBUTRIN XL) 300 MG 24 hr tablet TAKE 1 TABLET (300 MG TOTAL) BY MOUTH DAILY. WITH FOOD 90 tablet 3  . butalbital-acetaminophen-caffeine (FIORICET, ESGIC) 50-325-40 MG tablet Take 1 tablet by mouth daily as needed for headache. Do not refill in less than 30 days, 12 tablet 5  . cetirizine (ZYRTEC) 10 MG tablet Take 10 mg by mouth daily.    . Diclofenac Potassium (CAMBIA) 50 MG PACK TAKE 1 PACKET AT ONSET OF MIGRAINE. CAN REPEAT IN 2 HR ONCE IF NEEDED. NO MORE THAN 2 DOSES IN 24HR. 8 each 11  . fenofibrate micronized (LOFIBRA) 134 MG capsule TAKE 1 CAPSULE EVERY DAY 90 capsule 3  . levothyroxine (SYNTHROID, LEVOTHROID) 88 MCG tablet TAKE  1 TABLET BY MOUTH EVERY DAY 90 tablet 1  . magnesium oxide (MAG-OX) 400 MG tablet Take 400 mg by mouth 2 (two) times daily.     . montelukast (SINGULAIR) 10 MG tablet TAKE 1 TABLET BY MOUTH EVERYDAY AT BEDTIME 90 tablet 1  . Multiple Vitamin (MULTIVITAMIN WITH MINERALS) TABS tablet Take 1 tablet by mouth daily.    . nortriptyline (PAMELOR) 25 MG capsule TAKE 2 CAPSULES (50 MG TOTAL) BY MOUTH AT BEDTIME. 180 capsule 3  . omeprazole (PRILOSEC) 40 MG capsule Take 1 capsule Daily for Acid Indigestion & Reflux 90 capsule 1  . prochlorperazine (COMPAZINE) 5 MG tablet TAKE 1 TABLET BY MOUTH 3 TIMES A DAY AS NEEDED FOR NAUSEA 50 tablet 1  . Riboflavin 100 MG TABS Take 100 mg by mouth. bid    . rosuvastatin (CRESTOR) 40 MG tablet     . topiramate (TOPAMAX) 100 MG tablet Take 1.5 tablets (150 mg total) by mouth daily. 135 tablet 3  .  verapamil (CALAN-SR) 240 MG CR tablet Take 1 tablet every morning on a full stomach for BP & Migraine prevention 90 tablet 3  . vitamin B-12 (CYANOCOBALAMIN) 1000 MCG tablet Take 1,000 mcg by mouth daily.    . Vitamin D, Ergocalciferol, (DRISDOL) 1.25 MG (50000 UT) CAPS capsule TAKE 1 CAPSULE EVERY DAY 90 capsule 1   No current facility-administered medications for this visit.     PAST MEDICAL HISTORY: Past Medical History:  Diagnosis Date  . Anemia   . Anxiety   . Asthma   . Chronic heartburn   . Depression   . Elevated cholesterol    on Crestor  . Headache(784.0)   . Hypertension   . Hypothyroidism    S/P thyroidectomy  . Migraine   . Occlusion and stenosis of carotid artery with cerebral infarction   . Spasm of muscle   . Unspecified cerebral artery occlusion with cerebral infarction   . Vitamin D deficiency     PAST SURGICAL HISTORY: Past Surgical History:  Procedure Laterality Date  . AUGMENTATION MAMMAPLASTY Bilateral 2000  . COSMETIC SURGERY    . CYSTOCELE REPAIR N/A 01/08/2013   Procedure: ANTERIOR REPAIR (CYSTOCELE);  Surgeon: Meriel Pica, MD;  Location: [facility];  Service: Gynecology;  Laterality: N/A;  . NASAL SINUS SURGERY    . VAGINAL HYSTERECTOMY      FAMILY HISTORY: Family History  Problem Relation Age of Onset  . Heart disease Mother   . Peptic Ulcer Disease Mother   . Heart disease Father   . Parkinson's disease Father     SOCIAL HISTORY:  Social History   Socioeconomic History  . Marital status: Married    Spouse name: Brett Canales  . Number of children: 2  . Years of education: college  . Highest education level: Not on file  Occupational History  . Occupation: DESIGNER    Employer: SELF  Social Needs  . Financial resource strain: Not on file  . Food insecurity:    Worry: Not on file    Inability: Not on file  . Transportation needs:    Medical: Not on file    Non-medical: Not on file  Tobacco Use  . Smoking status: Never Smoker   . Smokeless tobacco: Never Used  Substance and Sexual Activity  . Alcohol use: Yes    Comment: occassionally drinks a glass of wine  . Drug use: No  . Sexual activity: Not on file  Lifestyle  . Physical activity:  Days per week: Not on file    Minutes per session: Not on file  . Stress: Not on file  Relationships  . Social connections:    Talks on phone: Not on file    Gets together: Not on file    Attends religious service: Not on file    Active member of club or organization: Not on file    Attends meetings of clubs or organizations: Not on file    Relationship status: Not on file  . Intimate partner violence:    Fear of current or ex partner: Not on file    Emotionally abused: Not on file    Physically abused: Not on file    Forced sexual activity: Not on file  Other Topics Concern  . 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 Patient is married and lives with her husband. Patient has college education.   Right handed.   Caffeine- two cups daily.     PHYSICAL EXAM   Vitals:   08/23/18 0813  Height: 5\' 9"  (1.753 m)    Not recorded      Body mass index is 24.28 kg/m.      NEUROLOGICAL EXAM:  MENTAL STATUS: Speech:    Speech is normal; fluent and spontaneous with normal comprehension.  Cognition:     Orientation to time, place and person     Normal recent and remote memory     Normal Attention span and concentration     Normal Language, naming, repeating,spontaneous speech     Fund of knowledge   CRANIAL NERVES: CN II: Visual fields are full to confrontation.  Pupils are round equal and briskly reactive to light. CN III, IV, VI: extraocular movement are normal. No ptosis. CN V: Facial sensation is intact to pinprick in all 3 divisions bilaterally. Corneal responses are intact.  CN VII: Face is symmetric with normal eye closure and smile. CN VIII: Hearing is normal to rubbing fingers CN IX, X:  Palate elevates symmetrically. Phonation is normal. CN XI: Head turning and shoulder shrug are intact CN XII: Tongue is midline with normal movements and no atrophy.  MOTOR: Mild spastic left hemiparesis, left upper extremity proximal and distal 4/5, mild left hip flexion and left ankle dorsiflexion weakness  REFLEXES: Reflexes are 2+ and symmetric at the biceps, triceps, knees, and ankles. Plantar responses are flexor.  SENSORY: Intact to light touch, pinprick, position sense, and vibration sense are intact in fingers and toes.  COORDINATION: Rapid alternating movements and fine finger movements are intact. There is no dysmetria on finger-to-nose and heel-knee-shin.    GAIT/STANCE: Mildly unsteady gait, checking her left foot across the floor  Romberg is absent.   DIAGNOSTIC DATA (LABS, IMAGING, TESTING) - I reviewed patient records, labs, notes, testing and imaging myself where available.   ASSESSMENT AND PLAN  Creola CornKim Mendonca is a 58 y.o. female   Chronic migraine headaches  Continue preventive medications, extended release topiramate 150 mg every day  Cambia, Fioricet, Compazine as needed,+ tizanidine prn  Continue magnesium oxide, riboflavin as preventive medications,  Add on nortriptyline 25 mg titrating to 50 every night as migraine prevention   Spastic left hemiparesis following right internal carotid artery dissection, right MCA stroke  EMG guided Botox injection, used 400 units of Botox A total   Left pronator teres 25 units Left flexor digitorum profoundi 25 units Left flexor digitorum superficialis 25 untis Left palmoris longus 25 units  Left brachialis 25 units Left pectoralis major 25 units Left latissimus dorsi 50 units   Left tibialis posterior 50 units Left flexor digitorum longus 25 unitsx2=50 units  I also injected  100 units for chronic migraine headache  Left corrugate 5 units Right corrugate 5 units Prosperous 5 units Frontalis 20 Temporalis 40  units Occipital 25 units    Also provide information on CGRP antagonist.  Levert Feinstein, M.D. Ph.D.  [facility] [address], [address] [place], [place] [address] Ph: [phone] Fax: [phone]  CC: Referring Provider

## 2018-08-23 NOTE — Telephone Encounter (Signed)
3 mo Botox inj w/ Dr. Terrace Arabia

## 2018-08-23 NOTE — Progress Notes (Signed)
**  Botox 100 units x 4 vials, NDC 4193-7902-40, Lot [id number], Exp 02/2021, office supply.//mck,rn**

## 2018-08-29 ENCOUNTER — Other Ambulatory Visit (INDEPENDENT_AMBULATORY_CARE_PROVIDER_SITE_OTHER): Payer: BLUE CROSS/BLUE SHIELD

## 2018-08-29 DIAGNOSIS — Z1211 Encounter for screening for malignant neoplasm of colon: Secondary | ICD-10-CM | POA: Diagnosis not present

## 2018-08-29 LAB — POC HEMOCCULT BLD/STL (HOME/3-CARD/SCREEN)
Card #2 Fecal Occult Blod, POC: NEGATIVE
Card #3 Fecal Occult Blood, POC: NEGATIVE
Fecal Occult Blood, POC: NEGATIVE

## 2018-09-14 ENCOUNTER — Other Ambulatory Visit: Payer: Self-pay

## 2018-09-14 ENCOUNTER — Encounter: Payer: Self-pay | Admitting: Adult Health Nurse Practitioner

## 2018-09-14 ENCOUNTER — Other Ambulatory Visit: Payer: Self-pay | Admitting: Internal Medicine

## 2018-09-14 ENCOUNTER — Ambulatory Visit (INDEPENDENT_AMBULATORY_CARE_PROVIDER_SITE_OTHER): Payer: BC Managed Care – PPO | Admitting: Adult Health Nurse Practitioner

## 2018-09-14 VITALS — BP 126/82 | HR 86 | Temp 97.5°F | Ht 69.0 in | Wt 165.2 lb

## 2018-09-14 DIAGNOSIS — L568 Other specified acute skin changes due to ultraviolet radiation: Secondary | ICD-10-CM | POA: Diagnosis not present

## 2018-09-14 DIAGNOSIS — G43709 Chronic migraine without aura, not intractable, without status migrainosus: Secondary | ICD-10-CM

## 2018-09-14 DIAGNOSIS — R110 Nausea: Secondary | ICD-10-CM

## 2018-09-14 DIAGNOSIS — G43011 Migraine without aura, intractable, with status migrainosus: Secondary | ICD-10-CM

## 2018-09-14 DIAGNOSIS — IMO0002 Reserved for concepts with insufficient information to code with codable children: Secondary | ICD-10-CM

## 2018-09-14 MED ORDER — PROMETHAZINE HCL 25 MG/ML IJ SOLN
25.0000 mg | Freq: Once | INTRAMUSCULAR | Status: AC
  Administered 2018-09-14: 25 mg via INTRAMUSCULAR

## 2018-09-14 MED ORDER — PREDNISONE 20 MG PO TABS: ORAL_TABLET | ORAL | 1 refills | Status: DC

## 2018-09-14 MED ORDER — KETOROLAC TROMETHAMINE 60 MG/2ML IM SOLN
60.0000 mg | Freq: Once | INTRAMUSCULAR | Status: AC
  Administered 2018-09-14: 60 mg via INTRAMUSCULAR

## 2018-09-14 NOTE — Progress Notes (Signed)
Assessment and Plan:  Ruth Gregory was seen today for acute visit and migraine.  Diagnoses and all orders for this visit:  Intractable migraine without aura and with status migrainosus IM in office today.  Has transportation home -     ketorolac (TORADOL) injection 60 mg -     promethazine (PHENERGAN) injection 25 mg Discussed therapy and side effects, no operating machinery or driving vehicle.  Photosensitivity Discussed decreasing stimulation Avoiding electronics  Chronic migraine  Discussed keeping to her prescribed regiment for prevention Continue to follow with specialist.  Nausea IM promethazine Discussed gingerale, small bland snacks avoid empty stomach.  Call or return with new or worsening symptoms. Patient agrees with plan of care and all questions were answered.   Over 30 minutes of exam, counseling, chart review, and critical decision making was performed.   Future Appointments  Date Time Provider [place]  11/01/2018 11:00 AM Liane Comber, NP [facility] None  11/29/2018  2:30 PM Marcial Pacas, MD [facility] None  02/07/2019 11:00 AM Unk Pinto, MD [facility] None  08/14/2019  2:00 PM Unk Pinto, MD [facility] None    ------------------------------------------------------------------------------------------------------------------   HPI 58 y.o.female presents for headache.  She has a migraine that has persisted for three days.  She does follow with Neurology for botox injections, Topamax 1.5mg , 150mg  twice a day and reports she has not been consistent with this.   She has Fioricet to use PRN  And has not used this, and used to take it with cambia and has not filled this since Feb.  She is also taking Verapamil 240mg  daily.  Reports she is having light sensitivity, pain is rated 9/10 with stimulation. The quality is pounding headache.  Denies vomiting but endorses nausea with severe photosensitivity.  She drove herself to the appointment today but her husband  will be picking her up to drive her home.  Past Medical History:  Diagnosis Date  . Anemia   . Anxiety   . Asthma   . Chronic heartburn   . Depression   . Elevated cholesterol    on Crestor  . Headache(784.0)   . Hypertension   . Hypothyroidism    S/P thyroidectomy  . Migraine   . Occlusion and stenosis of carotid artery with cerebral infarction   . Spasm of muscle   . Unspecified cerebral artery occlusion with cerebral infarction   . Vitamin D deficiency      Allergies  Allergen Reactions  . Penicillins Anaphylaxis and Hives  . Imitrex [Sumatriptan]   . Other Swelling    kiwi  . Sulfa Antibiotics Hives  . Zomig [Zolmitriptan]     Current Outpatient Medications on File Prior to Visit  Medication Sig  . albuterol (PROVENTIL HFA) 108 (90 Base) MCG/ACT inhaler 1 to 2 inhalations 10-15 minutes apart every 4 hours if needed for asthma rescue  . ALPRAZolam (XANAX) 1 MG tablet Take 1/2 tablet to 1 tablet two to three times a day as needed for anxiety. Limit to 5 days a week to avoid addiction.  Marland Kitchen amphetamine-dextroamphetamine (ADDERALL) 10 MG tablet Take 1/2 to 1 tablet 1 or 2 x daily for ADD  . aspirin EC 81 MG tablet Take 81 mg by mouth daily.  . Botulinum Toxin Type A (BOTOX) 200 UNITS SOLR Inject as directed. Change to BOTOX A 100  units  . buPROPion (WELLBUTRIN XL) 300 MG 24 hr tablet TAKE 1 TABLET (300 MG TOTAL) BY MOUTH DAILY. WITH FOOD  . butalbital-acetaminophen-caffeine (FIORICET, ESGIC) 50-325-40 MG tablet  Take 1 tablet by mouth daily as needed for headache. Do not refill in less than 30 days,  . cetirizine (ZYRTEC) 10 MG tablet Take 10 mg by mouth daily.  . Diclofenac Potassium (CAMBIA) 50 MG PACK TAKE 1 PACKET AT ONSET OF MIGRAINE. CAN REPEAT IN 2 HR ONCE IF NEEDED. NO MORE THAN 2 DOSES IN 24HR.  . fenofibrate micronized (LOFIBRA) 134 MG capsule TAKE 1 CAPSULE EVERY DAY  . levothyroxine (SYNTHROID, LEVOTHROID) 88 MCG tablet TAKE 1 TABLET BY MOUTH EVERY DAY  .  magnesium oxide (MAG-OX) 400 MG tablet Take 400 mg by mouth 2 (two) times daily.   . montelukast (SINGULAIR) 10 MG tablet TAKE 1 TABLET BY MOUTH EVERYDAY AT BEDTIME  . Multiple Vitamin (MULTIVITAMIN WITH MINERALS) TABS tablet Take 1 tablet by mouth daily.  . nortriptyline (PAMELOR) 25 MG capsule TAKE 2 CAPSULES (50 MG TOTAL) BY MOUTH AT BEDTIME.  Marland Kitchen. omeprazole (PRILOSEC) 40 MG capsule Take 1 capsule Daily for Acid Indigestion & Reflux  . prochlorperazine (COMPAZINE) 5 MG tablet TAKE 1 TABLET BY MOUTH 3 TIMES A DAY AS NEEDED FOR NAUSEA  . Riboflavin 100 MG TABS Take 100 mg by mouth. bid  . rosuvastatin (CRESTOR) 40 MG tablet   . topiramate (TOPAMAX) 100 MG tablet Take 1.5 tablets (150 mg total) by mouth daily.  . verapamil (CALAN-SR) 240 MG CR tablet Take 1 tablet every morning on a full stomach for BP & Migraine prevention  . vitamin B-12 (CYANOCOBALAMIN) 1000 MCG tablet Take 1,000 mcg by mouth daily.  . Vitamin D, Ergocalciferol, (DRISDOL) 1.25 MG (50000 UT) CAPS capsule TAKE 1 CAPSULE EVERY DAY   No current facility-administered medications on file prior to visit.     ROS: all negative except above.   Physical Exam:  BP 126/82   Pulse 86   Temp (!) 97.5 F (36.4 C)   Ht 5\' 9"  (1.753 m)   Wt 165 lb 3.2 oz (74.9 kg)   SpO2 98%   BMI 24.40 kg/m   General Appearance: Well nourished, in no apparent distress. Eyes: PERRLA, EOMs, conjunctiva no swelling or erythema Sinuses: No Frontal/maxillary tenderness ENT/Mouth: Ext aud canals clear, TMs without erythema, bulging. No erythema, swelling, or exudate on post pharynx.  Tonsils not swollen or erythematous. Hearing normal.  Neck: Supple, thyroid normal.  Respiratory: Respiratory effort normal, BS equal bilaterally without rales, rhonchi, wheezing or stridor.  Cardio: RRR with no MRGs. Brisk peripheral pulses without edema.  Abdomen: Soft, + BS.  Non tender, no guarding, rebound, hernias, masses. Psych: Awake and oriented X 3, normal  affect, Insight and Judgment appropriate.     Elder NegusKyra Mckennah Kretchmer, NP 10:36 AM [facility]

## 2018-09-14 NOTE — Patient Instructions (Addendum)
Start back on your preventative medication regiment.  Be sure you have PRN medications filled so you have them on hand for use.  Today you are receiving an injection of toradol and phenergan.  You will be sleepy, DO NOT DRIVE!  Drink plenty of fluids (80ox or more a day) and get rest, with low stimulation.     Here is general migraine information:  1.  Limit use of pain relievers to no more than 2 days out of week to prevent risk of rebound or medication-overuse headache. 2.  Keep headache diary 3.  Exercise, hydration, caffeine cessation, sleep hygiene, monitor for and avoid triggers 4.  Consider:  magnesium citrate 400mg  daily, riboflavin 400mg  daily, and coenzyme Q10 100mg  three times daily   We may also treat TMJ if we think you have it If you are having frequent migraines we may put you on a once a day medication with fast acting medication to take. Also there is such a thing called rebound headache from over use of acute medications.  Please do not use rescue or acute medications more than 10 days a month or more than 3 days per week, this can cause a withdrawal and a rebound headache.  Here is more information below  Please remember, common headache triggers are: sleep deprivation, dehydration, overheating, stress, hypoglycemia or skipping meals and blood sugar fluctuations, excessive pain medications or excessive alcohol use or caffeine withdrawal. Some people have food triggers such as aged cheese, orange juice or chocolate, especially dark chocolate, or MSG (monosodium glutamate). Try to avoid these headache triggers as much possible.   It may be helpful to keep a headache diary to figure out what makes your headaches worse or brings them on and what alleviates them. Some people report headache onset after exercise but studies have shown that regular exercise may actually prevent headaches from coming. If you have exercise-induced headaches, please make sure that you drink plenty of  fluid before and after exercising and that you do not over do it and do not overheat.   Please go to the ER if there is weakness, thunderclap headache, visual changes, or any concerning factors    Migraine Headache A migraine headache is an intense, throbbing pain on one or both sides of your head. Recurrent migraines keep coming back. A migraine can last for 30 minutes to several hours. CAUSES  The exact cause of a migraine headache is not always known. However, a migraine may be caused when nerves in the brain become irritated and release chemicals that cause inflammation. This causes pain. Certain things may also trigger migraines, such as:   Alcohol.  Smoking.  Stress.  Menstruation.  Aged cheeses.  Foods or drinks that contain nitrates, glutamate, aspartame, or tyramine.  Lack of sleep.  Chocolate.  Caffeine.  Hunger.  Physical exertion.  Fatigue.  Medicines used to treat chest pain (nitroglycerine), birth control pills, estrogen, and some blood pressure medicines. SYMPTOMS   Pain on one or both sides of your head.  Pulsating or throbbing pain.  Severe pain that prevents daily activities.  Pain that is aggravated by any physical activity.  Nausea, vomiting, or both.  Dizziness.  Pain with exposure to bright lights, loud noises, or activity.  General sensitivity to bright lights, loud noises, or smells. Before you get a migraine, you may get warning signs that a migraine is coming (aura). An aura may include:  Seeing flashing lights.  Seeing bright spots, halos, or zigzag lines.  Having  tunnel vision or blurred vision.  Having feelings of numbness or tingling.  Having trouble talking.  Having muscle weakness. DIAGNOSIS  A recurrent migraine headache is often diagnosed based on:  Symptoms.  Physical examination.  A CT scan or MRI of your head. These imaging tests cannot diagnose migraines but can help rule out other causes of headaches.     TREATMENT  Medicines may be given for pain and nausea. Medicines can also be given to help prevent recurrent migraines. HOME CARE INSTRUCTIONS  Only take over-the-counter or prescription medicines for pain or discomfort as directed by your health care provider. The use of long-term narcotics is not recommended.  Lie down in a dark, quiet room when you have a migraine.  Keep a journal to find out what may trigger your migraine headaches. For example, write down:  What you eat and drink.  How much sleep you get.  Any change to your diet or medicines.  Limit alcohol consumption.  Quit smoking if you smoke.  Get 7-9 hours of sleep, or as recommended by your health care provider.  Limit stress.  Keep lights dim if bright lights bother you and make your migraines worse. SEEK MEDICAL CARE IF:   You do not get relief from the medicines given to you.  You have a recurrence of pain.  You have a fever. SEEK IMMEDIATE MEDICAL CARE IF:  Your migraine becomes severe.  You have a stiff neck.  You have loss of vision.  You have muscular weakness or loss of muscle control.  You start losing your balance or have trouble walking.  You feel faint or pass out.  You have severe symptoms that are different from your first symptoms. MAKE SURE YOU:   Understand these instructions.  Will watch your condition.  Will get help right away if you are not doing well or get worse.   This information is not intended to replace advice given to you by your health care provider. Make sure you discuss any questions you have with your health care provider.   Document Released: 12/08/2000 Document Revised: 04/05/2014 Document Reviewed: 11/20/2012 Elsevier Interactive Patient Education 2016 [employer]  Common Migraine Triggers   Foods Aged cheese, alcohol, nuts, chocolate, yogurt, onions, figs, liver, caffeinated foods and beverages, monosodium glutamate (MSG), smoked or pickled  fish/meat, nitrate/nitrate preserved foods (hotdogs, pepperoni, salami) tyramine  Medications Antibiotics (tetracycline, griseofulvin), antihypertensives (nifedipine, captopril), hormones (oral contraceptives, estrogens), histamine-2 blockers (cimetidine, raniidine, vasodilators (nitroglycerine, isosorbide dinitrate)  Sensory Stimuli Flickering/bright/fluorescent lights, bright sunlight, odors (perfume, chemicals, cigarette smoke)  Lifestyle Changes Time zones, sleep patterns, eating habits, caffeine withdrawal stress  Other Menstrual cycle, weather/season/air pressure changes, high altitude  Adapted from [place] and [place], [place]. Clin. J. Med. 1995; Rapoport and [place]ell. Conquering Headache, 1998  Hormonal variations also are believed to play a part.  Fluctuations of the female hormone estrogen (such as just before menstruation) affect a chemical called serotonin-when serotonin levels in the brain fall, the dilation (expansion) of blood vessels in the brain that is characteristic of migraine often follows.  Many factors or "triggers" can start a migraine.  In people who get migraines, most experts think certain activities or foods may trigger temporary changes in the blood vessels around the brain.  Swelling of these blood vessels may cause pain in the nearby nerves.  Allergy Headaches:  Hotdogs Milk  Onions  Thyme Bacon  Chocolate Garlic  Nutmeg Ham  Dark Cola Pork  Cinnamon [employer] CorporationSalami  Nuts  Egg  Ginger Sausage Red wine Cloves  Cheddar Cheese Caffeine

## 2018-09-21 ENCOUNTER — Other Ambulatory Visit: Payer: Self-pay

## 2018-09-21 DIAGNOSIS — F988 Other specified behavioral and emotional disorders with onset usually occurring in childhood and adolescence: Secondary | ICD-10-CM

## 2018-09-21 MED ORDER — AMPHETAMINE-DEXTROAMPHETAMINE 10 MG PO TABS: ORAL_TABLET | ORAL | 0 refills | Status: DC

## 2018-10-02 ENCOUNTER — Telehealth: Payer: Self-pay | Admitting: *Deleted

## 2018-10-02 NOTE — Telephone Encounter (Signed)
Signed orders for IV medications below provided to [facility].  They have given her an appt time on 10/03/2018 at 10:30am.  I have spoken to her husband who was agreeable to the treatment, date and time.  He will drive her to our office.

## 2018-10-02 NOTE — Telephone Encounter (Signed)
It is Ok to come in for infusion/or nerve block today or tomorrow

## 2018-10-02 NOTE — Telephone Encounter (Signed)
The patient is suffering with a migraine that has been unresponsive to home medications.  She has called requesting an infusion for treatment.  Her last infusion at our office was in 12/2017 for the following medications: 1) Depakote 1gram 2)  Toradol 30mg  3) Compazine 5mg 

## 2018-10-03 DIAGNOSIS — G43709 Chronic migraine without aura, not intractable, without status migrainosus: Secondary | ICD-10-CM | POA: Diagnosis not present

## 2018-10-09 ENCOUNTER — Telehealth: Payer: Self-pay | Admitting: Neurology

## 2018-10-09 NOTE — Telephone Encounter (Signed)
Pt states that her insurance is not covering her BOTOX and her Diclofenac Potassium (CAMBIA) 50 MG PACK. Pt would like to know if her Diclofenac Potassium (CAMBIA) 50 MG PACK can be sent to the pharmacy that will only charge her $20 She would also like the update on her BOTOX approval. Please advise.

## 2018-10-09 NOTE — Telephone Encounter (Signed)
I contacted [facility] at [phone] and was informed Cathren Harsh is an excluded drug from her plan.  She has refills on file at [facility] who can offer her a discounted cash price.  She has used them previously.   I left her a message with this information.  I also provided Avella's phone number so she can call to provide payment and schedule delivery [phone]).

## 2018-10-09 NOTE — Telephone Encounter (Signed)
I called the patient to discuss her Botox coverage. She states that she did not have any questions but received a letter from [facility] regarding Botox. The call was disconnected and when I tried to call back she did not answer.

## 2018-10-09 NOTE — Telephone Encounter (Signed)
Patient called back and I spoke with her regarding the bill for Botox. I relayed that her insurance asked [place] for clinicals and we sent those on 07/09. DW

## 2018-10-09 NOTE — Telephone Encounter (Signed)
She would like the Cambia prescription sent [facility] to see if her insurance will cover the medication this year.  She was informed it may need a prior authorization.   She is aware that Ruth Gregory will contact her back about the Botox issue.

## 2018-10-19 DIAGNOSIS — Z6824 Body mass index (BMI) 24.0-24.9, adult: Secondary | ICD-10-CM | POA: Diagnosis not present

## 2018-10-19 DIAGNOSIS — Z01419 Encounter for gynecological examination (general) (routine) without abnormal findings: Secondary | ICD-10-CM | POA: Diagnosis not present

## 2018-10-25 ENCOUNTER — Other Ambulatory Visit: Payer: Self-pay

## 2018-10-25 ENCOUNTER — Other Ambulatory Visit: Payer: Self-pay | Admitting: Internal Medicine

## 2018-10-25 DIAGNOSIS — F419 Anxiety disorder, unspecified: Secondary | ICD-10-CM

## 2018-10-25 MED ORDER — ALPRAZOLAM 1 MG PO TABS: ORAL_TABLET | ORAL | 0 refills | Status: DC

## 2018-11-01 ENCOUNTER — Ambulatory Visit: Payer: BLUE CROSS/BLUE SHIELD | Admitting: Adult Health

## 2018-11-13 NOTE — Progress Notes (Signed)
3 Month Follow Up   Assessment and Plan:   Chronic migraine Managing well at this time, managed with Neurology and receiving Botox injections Q77month. -Topamax ER 150 mg and nortriptyline 559mevery night as peventive medication, magnesium oxide 400 mg, riboflavin 100 mg twice a day.  R -For acute flares she utilizes Cambia PRN.  She also has Fioricet PRN, which she uses occassionally and Prednisone 2055mor rescue therapy.  Fatty liver Check labs, avoid tylenol, alcohol, weight loss advised.  Will continue to monitor -     COMPLETE METABOLIC PANEL WITH GFR  Gastroesophageal reflux disease, esophagitis presence not specified Doing well at this time, continue Prilosec 54m25mthout breakthrough Failed H2i taper x 2 -     Magnesium  Spastic hemiplegia of left nondominant side as late effect of cerebrovascular disease, unspecified cerebrovascular disease type (HCC)[place]ers[place] well at this time with movement and function Continue Botox injections, follow with Neurology, Dr Yan Krista Blue this. -     COMPLETE METABOLIC PANEL WITH GFR  Anxiety Doing well at this time Continue Alprazolam with benefit, rare use Stress management techniques discussed, increase water, good sleep hygiene discussed, increase exercise, and increase veggies.   Mixed hyperlipidemia Continue medications: rosuvastatin, fenofibrate  Continue low cholesterol diet and exercise.  Check lipid panel.  -     Lipid panel  Hypothyroidism, unspecified type -     TSH -     levothyroxine (SYNTHROID) 88 MCG tablet; Take 1 tablet (88 mcg total) by mouth daily. Reminder to take on an empty stomach 30-60mi27mefore first meal of the day.  ADD (attention deficit disorder) without hyperactivity Doing well on current regiment Continue with benefit -Adderall 10mg 18mto 1 tablet BID  Vitamin D deficiency Has reduced dose, was above goal last visit Hasn't met deductible; defer to next OV  Other abnormal glucose Recent A1Cs at  goal Discussed diet/exercise, weight management  Defer A1C; check CMP  Labile hypertension Controlled today, will continue to monitor  Medication management -     Magnesium -     CBC with Differential/Platelet -     COMPLETE METABOLIC PANEL WITH GFR  BMI 25.0-25.9 Discussed dietary and exercise modifications Continue to recommend diet heavy in fruits and veggies and low in animal meats, cheeses, and dairy products, appropriate calorie intake Discuss exercise recommendations routinely Continue to monitor weight at each visit  Continue diet and meds as discussed. Further disposition pending results of labs. Discussed med's effects and SE's.   Over 30 minutes of exam, counseling, chart review, and critical decision making was performed.   Future Appointments  Date Time Provider [email or web address]  2:30 PM Yan, YMarcial PacasNA-GNA None  12/28/2018 12:40 PM [facility] MM 3 [facility]  02/07/2019 11:00 AM McKeowUnk PintoAAM-[facility] None  08/14/2019  2:00 PM McKeowUnk PintoAAM-GAAIM None    ----------------------------------------------------------------------------------------------------------------------  HPI 58 [id number]  presents for 3 month follow up on elevated blood pressures w/o diagnosis of HTN, HLD, Migraine, Hypothyroidism,  Pre-Diabetes and Vitamin D Deficiency.     In 2009, she had a Right MCA CVA attributed to a Right Carotid artery dissection from fibromuscular dysplasia and has residual of a mild spastic Left HP and mild cognitive dysfunction difficulty focusing/concentrating. These latter sx's are improved with routine Adderall. Patient also follow with Dr Yan, NKrista Blueologist, for Bo-Tox injections for her spasticity and migraines.    Patient is on an ADD medication, she states that the medication is helping  and she denies any adverse reactions. Takes adderall 10 mg once daily Mon-Fri  she has a diagnosis of anxiety and is currently on  wellbutrin 300 mg and PRN xanax, reports symptoms are well controlled on current regimen. she reports currently takes xanax rarely, takes 1/2 tab just a few days per month.   she has a diagnosis of GERD which is currently managed by omeprazole 40 mg daily, didn't do well with attempt to taper with H2i x 2.  she reports symptoms is currently well controlled, and denies breakthrough reflux, burning in chest, hoarseness or cough.    BMI is Body mass index is 24.51 kg/m., she has been working on diet and exercise. Wt Readings from Last 3 Encounters:  11/15/18 166 lb (75.3 kg)  09/14/18 165 lb 3.2 oz (74.9 kg)  08/23/18 164 lb 8 oz (74.6 kg)   Her blood pressure has been controlled at home, today their BP is BP: 136/86  She does not workout. She denies any cardiac symptoms, chest pains, palpitations, shortness of breath, dizziness or lower extremity edema.     She is on cholesterol medication Rosuvastatin 40 mg every other day, fenofibrate 134 mg daily and denies myalgias. Her cholesterol is at goal. The cholesterol last visit was:   Lab Results  Component Value Date   CHOL 155 07/27/2018   HDL 58 07/27/2018   LDLCALC 71 07/27/2018   TRIG 193 (H) 07/27/2018   CHOLHDL 2.7 07/27/2018    She has not been working on diet and exercise for prediabetes ( 5.8%, 2008), and denies hyperglycemia, hypoglycemia , nausea, polydipsia and polyuria. Last A1C in the office was:  Lab Results  Component Value Date   HGBA1C 5.5 07/27/2018   Patient has been on Thyroid Supression since Thyroid surgeryfor benign nodule and goiterin 2007.  She is currently taking levothyroxine 88 mcg daily.   Her medication was not changed last visit.   Lab Results  Component Value Date   TSH 3.04 07/27/2018   Patient is on Vitamin D supplement, has cut back from 50000 daily to 3 days a week Lab Results  Component Value Date   VD25OH 119 (H) 07/27/2018       Current Medications:  Current Outpatient Medications on  File Prior to Visit  Medication Sig  . albuterol (PROVENTIL HFA) 108 (90 Base) MCG/ACT inhaler 1 to 2 inhalations 10-15 minutes apart every 4 hours if needed for asthma rescue  . ALPRAZolam (XANAX) 1 MG tablet Take 1/2 tablet to 1 tablet two to three times a day as needed for anxiety. Limit to 5 days a week to avoid addiction.  Marland Kitchen amphetamine-dextroamphetamine (ADDERALL) 10 MG tablet Take 1/2 to 1 tablet 1 or 2 x daily for ADD  . aspirin EC 81 MG tablet Take 81 mg by mouth daily.  . Botulinum Toxin Type A (BOTOX) 200 UNITS SOLR Inject as directed. Change to BOTOX A 100  units  . buPROPion (WELLBUTRIN XL) 300 MG 24 hr tablet TAKE 1 TABLET (300 MG TOTAL) BY MOUTH DAILY. WITH FOOD  . butalbital-acetaminophen-caffeine (FIORICET, ESGIC) 50-325-40 MG tablet Take 1 tablet by mouth daily as needed for headache. Do not refill in less than 30 days,  . cetirizine (ZYRTEC) 10 MG tablet Take 10 mg by mouth daily.  . Diclofenac Potassium (CAMBIA) 50 MG PACK TAKE 1 PACKET AT ONSET OF MIGRAINE. CAN REPEAT IN 2 HR ONCE IF NEEDED. NO MORE THAN 2 DOSES IN 24HR.  Marland Kitchen Estradiol (YUVAFEM) 10 MCG TABS vaginal  tablet Place 10 mcg vaginally 2 (two) times a week.  . fenofibrate micronized (LOFIBRA) 134 MG capsule TAKE 1 CAPSULE EVERY DAY  . levothyroxine (SYNTHROID, LEVOTHROID) 88 MCG tablet TAKE 1 TABLET BY MOUTH EVERY DAY  . magnesium oxide (MAG-OX) 400 MG tablet Take 400 mg by mouth 2 (two) times daily.   . montelukast (SINGULAIR) 10 MG tablet TAKE 1 TABLET BY MOUTH EVERYDAY AT BEDTIME  . Multiple Vitamin (MULTIVITAMIN WITH MINERALS) TABS tablet Take 1 tablet by mouth daily.  . nortriptyline (PAMELOR) 25 MG capsule TAKE 2 CAPSULES (50 MG TOTAL) BY MOUTH AT BEDTIME.  Marland Kitchen omeprazole (PRILOSEC) 40 MG capsule Take 1 capsule Daily for Acid Indigestion & Reflux  . Riboflavin 100 MG TABS Take 100 mg by mouth. bid  . rosuvastatin (CRESTOR) 40 MG tablet   . topiramate (TOPAMAX) 100 MG tablet Take 1.5 tablets (150 mg total) by mouth  daily. (Patient taking differently: Take 150 mg by mouth daily. Takes 2 tablets twice daily)  . verapamil (CALAN-SR) 240 MG CR tablet Take 1 tablet every morning on a full stomach for BP & Migraine prevention  . vitamin B-12 (CYANOCOBALAMIN) 1000 MCG tablet Take 1,000 mcg by mouth daily.  . Vitamin D, Ergocalciferol, (DRISDOL) 1.25 MG (50000 UT) CAPS capsule TAKE 1 CAPSULE EVERY DAY  . predniSONE (DELTASONE) 20 MG tablet 1 tab 3 x day for 3 days, then 1 tab 2 x day for 3 days, then 1 tab 1 x day for 5 days  . prochlorperazine (COMPAZINE) 5 MG tablet TAKE 1 TABLET BY MOUTH 3 TIMES A DAY AS NEEDED FOR NAUSEA (Patient not taking: Reported on 11/15/2018)   No current facility-administered medications on file prior to visit.     Allergies:  Allergies  Allergen Reactions  . Penicillins Anaphylaxis and Hives  . Imitrex [Sumatriptan]   . Other Swelling    kiwi  . Sulfa Antibiotics Hives  . Zomig [Zolmitriptan]      Medical History:  Past Medical History:  Diagnosis Date  . Anemia   . Anxiety   . Asthma   . Chronic heartburn   . Depression   . Elevated cholesterol    on Crestor  . Headache(784.0)   . Hypertension   . Hypothyroidism    S/P thyroidectomy  . Migraine   . Occlusion and stenosis of carotid artery with cerebral infarction   . Spasm of muscle   . Unspecified cerebral artery occlusion with cerebral infarction   . Vitamin D deficiency     Family history- Reviewed and unchanged  Social history- Reviewed and unchanged  Screening Tests: Immunization History  Administered Date(s) Administered  . Influenza Inj Mdck Quad With Preservative 01/20/2017, 01/09/2018  . Influenza Split 01/24/2014  . Influenza, Seasonal, Injecte, Preservative Fre 02/09/2016  . Influenza-Unspecified 11/28/2011  . PPD Test 04/04/2013, 04/22/2015, 05/18/2016, 06/20/2017, 07/27/2018  . Pneumococcal Polysaccharide-23 03/29/1998  . Td 03/29/2002  . Tdap 05/18/2016     Review of Systems:   Review of Systems  Constitutional: Negative for chills, diaphoresis, fever, malaise/fatigue and weight loss.  HENT: Negative for congestion, ear discharge, ear pain, hearing loss, nosebleeds, sinus pain, sore throat and tinnitus.   Eyes: Negative for blurred vision, double vision, photophobia, pain, discharge and redness.  Respiratory: Negative for cough, hemoptysis, sputum production, shortness of breath, wheezing and stridor.   Cardiovascular: Negative for chest pain, palpitations, orthopnea, claudication, leg swelling and PND.  Gastrointestinal: Negative for abdominal pain, blood in stool, constipation, diarrhea, heartburn, melena, nausea and vomiting.  Genitourinary: Negative for dysuria, flank pain, frequency, hematuria and urgency.  Musculoskeletal: Positive for myalgias and neck pain. Negative for back pain, falls and joint pain.  Skin: Negative for itching and rash.  Neurological: Positive for weakness. Negative for dizziness, tingling, tremors, sensory change, speech change, focal weakness, seizures, loss of consciousness and headaches.  Endo/Heme/Allergies: Negative for environmental allergies and polydipsia. Does not bruise/bleed easily.  Psychiatric/Behavioral: Negative for depression, hallucinations, memory loss, substance abuse and suicidal ideas. The patient is not nervous/anxious and does not have insomnia.      Physical Exam: BP 136/86   Pulse 98   Temp (!) 97.3 F (36.3 C)   Ht _0  (1.753 m)   Wt 166 lb (75.3 kg)   SpO2 99%   BMI 24.51 kg/m  Wt Readings from Last 3 Encounters:  11/15/18 166 lb (75.3 kg)  09/14/18 165 lb 3.2 oz (74.9 kg)  08/23/18 164 lb 8 oz (74.6 kg)   General Appearance: Well nourished, in no apparent distress. Eyes: PERRLA, EOMs, conjunctiva no swelling or erythema Sinuses: No Frontal/maxillary tenderness ENT/Mouth: Ext aud canals clear, TMs without erythema, bulging. No erythema, swelling, or exudate on post pharynx.  Tonsils not swollen or  erythematous. Hearing normal.  Neck: Supple, thyroid normal.  Respiratory: Respiratory effort normal, BS equal bilaterally without rales, rhonchi, wheezing or stridor.  Cardio: RRR with no MRGs. Brisk peripheral pulses without edema.  Abdomen: Soft, + BS.  Non tender, no guarding, rebound, hernias, masses. Lymphatics: Non tender without lymphadenopathy.  Musculoskeletal: Full ROM, 5/5 strength right, 4/5 strength on left. normal gait, slow. Skin: Warm, dry without rashes, lesions, ecchymosis.  Neuro: Cranial nerves intact. No cerebellar symptoms.  Psych: Awake and oriented X 3, normal affect, Insight and Judgment appropriate.    Izora Ribas, NP 11:32 AM [facility]

## 2018-11-14 ENCOUNTER — Other Ambulatory Visit: Payer: Self-pay | Admitting: Internal Medicine

## 2018-11-14 DIAGNOSIS — Z1231 Encounter for screening mammogram for malignant neoplasm of breast: Secondary | ICD-10-CM

## 2018-11-15 ENCOUNTER — Other Ambulatory Visit: Payer: Self-pay

## 2018-11-15 ENCOUNTER — Encounter: Payer: Self-pay | Admitting: Adult Health

## 2018-11-15 ENCOUNTER — Other Ambulatory Visit: Payer: Self-pay | Admitting: Internal Medicine

## 2018-11-15 ENCOUNTER — Other Ambulatory Visit: Payer: Self-pay | Admitting: Adult Health

## 2018-11-15 ENCOUNTER — Ambulatory Visit (INDEPENDENT_AMBULATORY_CARE_PROVIDER_SITE_OTHER): Payer: BC Managed Care – PPO | Admitting: Adult Health

## 2018-11-15 ENCOUNTER — Ambulatory Visit: Payer: BC Managed Care – PPO | Admitting: Adult Health

## 2018-11-15 VITALS — BP 136/86 | HR 98 | Temp 97.3°F | Ht 69.0 in | Wt 166.0 lb

## 2018-11-15 DIAGNOSIS — E782 Mixed hyperlipidemia: Secondary | ICD-10-CM

## 2018-11-15 DIAGNOSIS — R030 Elevated blood-pressure reading, without diagnosis of hypertension: Secondary | ICD-10-CM

## 2018-11-15 DIAGNOSIS — Z8673 Personal history of transient ischemic attack (TIA), and cerebral infarction without residual deficits: Secondary | ICD-10-CM

## 2018-11-15 DIAGNOSIS — IMO0002 Reserved for concepts with insufficient information to code with codable children: Secondary | ICD-10-CM

## 2018-11-15 DIAGNOSIS — R0989 Other specified symptoms and signs involving the circulatory and respiratory systems: Secondary | ICD-10-CM | POA: Diagnosis not present

## 2018-11-15 DIAGNOSIS — E890 Postprocedural hypothyroidism: Secondary | ICD-10-CM | POA: Diagnosis not present

## 2018-11-15 DIAGNOSIS — E559 Vitamin D deficiency, unspecified: Secondary | ICD-10-CM

## 2018-11-15 DIAGNOSIS — G43709 Chronic migraine without aura, not intractable, without status migrainosus: Secondary | ICD-10-CM | POA: Diagnosis not present

## 2018-11-15 DIAGNOSIS — K219 Gastro-esophageal reflux disease without esophagitis: Secondary | ICD-10-CM | POA: Diagnosis not present

## 2018-11-15 DIAGNOSIS — Z79899 Other long term (current) drug therapy: Secondary | ICD-10-CM | POA: Diagnosis not present

## 2018-11-15 DIAGNOSIS — F988 Other specified behavioral and emotional disorders with onset usually occurring in childhood and adolescence: Secondary | ICD-10-CM

## 2018-11-15 DIAGNOSIS — K760 Fatty (change of) liver, not elsewhere classified: Secondary | ICD-10-CM

## 2018-11-15 DIAGNOSIS — I69954 Hemiplegia and hemiparesis following unspecified cerebrovascular disease affecting left non-dominant side: Secondary | ICD-10-CM

## 2018-11-15 DIAGNOSIS — Z87898 Personal history of other specified conditions: Secondary | ICD-10-CM

## 2018-11-15 DIAGNOSIS — F419 Anxiety disorder, unspecified: Secondary | ICD-10-CM

## 2018-11-15 NOTE — Patient Instructions (Addendum)
High Triglycerides Eating Plan Triglycerides are a type of fat in the blood. High levels of triglycerides can increase your risk of heart disease and stroke. If your triglyceride levels are high, choosing the right foods can help lower your triglycerides and keep your heart healthy. Work with your health care provider or a diet and nutrition specialist (dietitian) to develop an eating plan that is right for you. What are tips for following this plan? General guidelines   Lose weight, if you are overweight. For most people, losing 5-10 lbs (2-5 kg) helps lower triglyceride levels. A weight-loss plan may include. ? 30 minutes of exercise at least 5 days a week. ? Reducing the amount of calories, sugar, and fat you eat.  Eat a wide variety of fresh fruits, vegetables, and whole grains. These foods are high in fiber.  Eat foods that contain healthy fats, such as fatty fish, nuts, seeds, and olive oil.  Avoid foods that are high in added sugar, added salt (sodium), saturated fat, and trans fat.  Avoid low-fiber, refined carbohydrates such as white bread, crackers, noodles, and white rice.  Avoid foods with partially hydrogenated oils (trans fats), such as fried foods or stick margarine.  Limit alcohol intake to no more than 1 drink a day for nonpregnant women and 2 drinks a day for men. One drink equals 12 oz of beer, 5 oz of wine, or 1 oz of hard liquor. Your health care provider may recommend that you drink less depending on your overall health. Reading food labels  Check food labels for the amount of saturated fat. Choose foods with no or very little saturated fat.  Check food labels for the amount of trans fat. Choose foods with no trans fat.  Check food labels for the amount of cholesterol. Choose foods low in cholesterol. Ask your dietitian how much cholesterol you should have each day.  Check food labels for the amount of sodium. Choose foods with less than 140 milligrams (mg) per  serving. Shopping  Buy dairy products labeled as nonfat (skim) or low-fat (1%).  Avoid buying processed or prepackaged foods. These are often high in added sugar, sodium, and fat. Cooking  Choose healthy fats when cooking, such as olive oil or canola oil.  Cook foods using lower fat methods, such as baking, broiling, boiling, or grilling.  Make your own sauces, dressings, and marinades when possible, instead of buying them. Store-bought sauces, dressings, and marinades are often high in sodium and sugar. Meal planning  Eat more home-cooked food and less restaurant, buffet, and fast food.  Eat fatty fish at least 2 times each week. Examples of fatty fish include salmon, trout, mackerel, tuna, and herring.  If you eat whole eggs, do not eat more than 3 egg yolks per week. What foods are recommended? The items listed may not be a complete list. Talk with your dietitian about what dietary choices are best for you. Grains Whole wheat or whole grain breads, crackers, cereals, and pasta. Unsweetened oatmeal. Bulgur. Barley. Quinoa. Brown rice. Whole wheat flour tortillas. Vegetables Fresh or frozen vegetables. Low-sodium canned vegetables. Fruits All fresh, canned (in natural juice), or frozen fruits. Meats and other protein foods Skinless chicken or turkey. Ground chicken or turkey. Lean cuts of pork, trimmed of fat. Fish and seafood, especially salmon, trout, and herring. Egg whites. Dried beans, peas, or lentils. Unsalted nuts or seeds. Unsalted canned beans. Natural peanut or almond butter. Dairy Low-fat dairy products. Skim or low-fat (1%) milk. Reduced fat (  2%) and low-sodium cheese. Low-fat ricotta cheese. Low-fat cottage cheese. Plain, low-fat yogurt. Fats and oils Tub margarine without trans fats. Light or reduced-fat mayonnaise. Light or reduced-fat salad dressings. Avocado. Safflower, olive, sunflower, soybean, and canola oils. What foods are not recommended? The items listed  may not be a complete list. Talk with your dietitian about what dietary choices are best for you. Grains White bread. White (regular) pasta. White rice. Cornbread. Bagels. Pastries. Crackers that contain trans fat. Vegetables Creamed or fried vegetables. Vegetables in a cheese sauce. Fruits Sweetened dried fruit. Canned fruit in syrup. Fruit juice. Meats and other protein foods Fatty cuts of meat. Ribs. Chicken wings. Bacon. Sausage. Bologna. Salami. Chitterlings. Fatback. Hot dogs. Bratwurst. Packaged lunch meats. Dairy Whole or reduced-fat (2%) milk. Half-and-half. Cream cheese. Full-fat or sweetened yogurt. Full-fat cheese. Nondairy creamers. Whipped toppings. Processed cheese or cheese spreads. Cheese curds. Beverages Alcohol. Sweetened drinks, such as soda, lemonade, fruit drinks, or punches. Fats and oils Butter. Stick margarine. Lard. Shortening. Ghee. Bacon fat. Tropical oils, such as coconut, palm kernel, or palm oils. Sweets and desserts Corn syrup. Sugars. Honey. Molasses. Candy. Jam and jelly. Syrup. Sweetened cereals. Cookies. Pies. Cakes. Donuts. Muffins. Ice cream. Condiments Store-bought sauces, dressings, and marinades that are high in sugar, such as ketchup and barbecue sauce. Summary  High levels of triglycerides can increase the risk of heart disease and stroke. Choosing the right foods can help lower your triglycerides.  Eat plenty of fresh fruits, vegetables, and whole grains. Choose low-fat dairy and lean meats. Eat fatty fish at least twice a week.  Avoid processed and prepackaged foods with added sugar, sodium, saturated fat, and trans fat.  If you need suggestions or have questions about what types of food are good for you, talk with your health care provider or a dietitian. This information is not intended to replace advice given to you by your health care provider. Make sure you discuss any questions you have with your health care provider. Document Released:  01/01/2004 Document Revised: 02/25/2017 Document Reviewed: 05/18/2016 Elsevier Patient Education  2020 [employer]  

## 2018-11-16 ENCOUNTER — Other Ambulatory Visit: Payer: Self-pay | Admitting: Adult Health

## 2018-11-16 ENCOUNTER — Telehealth: Payer: Self-pay | Admitting: Adult Health

## 2018-11-16 LAB — CBC WITH DIFFERENTIAL/PLATELET
Absolute Monocytes: 281 cells/uL (ref 200–950)
Basophils Absolute: 29 cells/uL (ref 0–200)
Basophils Relative: 0.8 %
Eosinophils Absolute: 169 cells/uL (ref 15–500)
Eosinophils Relative: 4.7 %
HCT: 44.7 % (ref 35.0–45.0)
Hemoglobin: 15.3 g/dL (ref 11.7–15.5)
Lymphs Abs: 1296 cells/uL (ref 850–3900)
MCH: 32 pg (ref 27.0–33.0)
MCHC: 34.2 g/dL (ref 32.0–36.0)
MCV: 93.5 fL (ref 80.0–100.0)
MPV: 11.5 fL (ref 7.5–12.5)
Monocytes Relative: 7.8 %
Neutro Abs: 1825 cells/uL (ref 1500–7800)
Neutrophils Relative %: 50.7 %
Platelets: 169 10*3/uL (ref 140–400)
RBC: 4.78 10*6/uL (ref 3.80–5.10)
RDW: 12.5 % (ref 11.0–15.0)
Total Lymphocyte: 36 %
WBC: 3.6 10*3/uL — ABNORMAL LOW (ref 3.8–10.8)

## 2018-11-16 LAB — COMPLETE METABOLIC PANEL WITH GFR
AG Ratio: 2 (calc) (ref 1.0–2.5)
ALT: 28 U/L (ref 6–29)
AST: 16 U/L (ref 10–35)
Albumin: 4.5 g/dL (ref 3.6–5.1)
Alkaline phosphatase (APISO): 85 U/L (ref 37–153)
BUN/Creatinine Ratio: 15 (calc) (ref 6–22)
BUN: 16 mg/dL (ref 7–25)
CO2: 23 mmol/L (ref 20–32)
Calcium: 9.6 mg/dL (ref 8.6–10.4)
Chloride: 109 mmol/L (ref 98–110)
Creat: 1.06 mg/dL — ABNORMAL HIGH (ref 0.50–0.99)
GFR, Est African American: 66 mL/min/{1.73_m2} (ref >=60)
GFR, Est Non African American: 57 mL/min/{1.73_m2} — ABNORMAL LOW (ref >=60)
Globulin: 2.2 g/dL (calc) (ref 1.9–3.7)
Glucose, Bld: 95 mg/dL (ref 65–99)
Potassium: 4.2 mmol/L (ref 3.5–5.3)
Sodium: 141 mmol/L (ref 135–146)
Total Bilirubin: 0.6 mg/dL (ref 0.2–1.2)
Total Protein: 6.7 g/dL (ref 6.1–8.1)

## 2018-11-16 LAB — LIPID PANEL
Cholesterol: 190 mg/dL (ref <200)
HDL: 50 mg/dL (ref >=50)
LDL Cholesterol (Calc): 103 mg/dL (calc) — ABNORMAL HIGH
Non-HDL Cholesterol (Calc): 140 mg/dL (calc) — ABNORMAL HIGH (ref <130)
Total CHOL/HDL Ratio: 3.8 (calc) (ref <5.0)
Triglycerides: 257 mg/dL — ABNORMAL HIGH (ref <150)

## 2018-11-16 LAB — TSH: TSH: 1.54 mIU/L (ref 0.40–4.50)

## 2018-11-16 LAB — MAGNESIUM: Magnesium: 2.2 mg/dL (ref 1.5–2.5)

## 2018-11-16 MED ORDER — ROSUVASTATIN CALCIUM 40 MG PO TABS: 40.0000 mg | ORAL_TABLET | Freq: Every day | ORAL | 1 refills | Status: DC

## 2018-11-16 NOTE — Telephone Encounter (Signed)
Renewal crestor 40mg  generic- [facility]

## 2018-11-19 ENCOUNTER — Other Ambulatory Visit: Payer: Self-pay | Admitting: Internal Medicine

## 2018-11-21 DIAGNOSIS — R102 Pelvic and perineal pain: Secondary | ICD-10-CM | POA: Diagnosis not present

## 2018-11-29 ENCOUNTER — Ambulatory Visit (INDEPENDENT_AMBULATORY_CARE_PROVIDER_SITE_OTHER): Payer: BC Managed Care – PPO | Admitting: Neurology

## 2018-11-29 ENCOUNTER — Other Ambulatory Visit: Payer: Self-pay

## 2018-11-29 ENCOUNTER — Encounter: Payer: Self-pay | Admitting: Neurology

## 2018-11-29 VITALS — BP 124/72 | HR 88 | Temp 97.8°F | Ht 69.0 in | Wt 164.0 lb

## 2018-11-29 DIAGNOSIS — I69954 Hemiplegia and hemiparesis following unspecified cerebrovascular disease affecting left non-dominant side: Secondary | ICD-10-CM | POA: Diagnosis not present

## 2018-11-29 DIAGNOSIS — IMO0002 Reserved for concepts with insufficient information to code with codable children: Secondary | ICD-10-CM

## 2018-11-29 DIAGNOSIS — G43709 Chronic migraine without aura, not intractable, without status migrainosus: Secondary | ICD-10-CM

## 2018-11-29 MED ORDER — ONABOTULINUMTOXINA 100 UNITS IJ SOLR
400.0000 [IU] | Freq: Once | INTRAMUSCULAR | Status: AC
  Administered 2018-11-29: 400 [IU] via INTRAMUSCULAR

## 2018-11-29 NOTE — Progress Notes (Signed)
Chief Complaint  Patient presents with  . Spasticity/Migraines    Botox 200 unts x 2 vials - office supply      PATIENT: Ruth Gregory DOB: [birth year]  Chief Complaint  Patient presents with  . Spasticity/Migraines    Botox 200 unts x 2 vials - office supply     HISTORICAL  Ruth Gregory is a 58 year old right-handed female  History of right internal carotid section with spastic left hemiparesis, Botox injection every 3 months  She has history of right internal carotid artery dissection following an episode of vomiting from migraine headache in mid June 2009. She was given IV TPA and found to have right MCA occlusion. She underwent emergent right carotid artery stent with distal endovascular recanalization of the middle cerebral artery.  She had small hemorrhagic transformation of the right basal ganglia with mild left hemiparesis, but has done well since then and has been independent.  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  She has been receiving BOTOX injection since 01/2009 every 3 months, to her left upper extremity and also left lower extremity, responded well.  Repeat US carotid were normal in October 2013  Chronic migraine headaches: She had long-standing history of chronic migraine, average 2-4 headaches each months, she has increased frequency headaches around fall, and spring, she is now taking Topamax ER 150 mg every night as preventive medication, magnesium oxide 400 mg,  riboflavin 100 mg twice a day,  She is not candidate for triptan because previous history of stroke, Cambia works well for her, she her insurance only allow her to have 9 tablets each months. Occasionally she take Fioricet, hydrocodone as needed, even prednisone 20 mg as needed as rescue therapy  Over the past few weeks, she been having to 3 headaches each week, used out her cambia, she also complains of neck muscle tightness, receiving chiropractor sometimes   Last Botox injection to spastic left upper extremity works well, but she noticed left hand muscle weakness.  UPDATE Mar 26 2015: She responded very well to previous Botox injection in September 2016, but noticed increased left hand weakness, gradually recovered over the past few weeks  Update July 15 2015: She responded very well to previous Botox injection in December 20 eighth 2016, noticed returning of left finger flexion spasticity, left ankle plantarflexion, want today's injection emphasize on above abnormal posturing.  She still has frequent migraine headaches, 1-2 headache each week, some headache was protracted, lasting 1-2 days, require multiple dose of cambia and Fioricet  Update November 20 2015: Patient called in September 09 2015 "She also said the left hand is very weak- 12,3 digits > 4,5. She said last OV she rec'd botox in wrist. She sts she may not be able to get botox next month if she is not feeling better"  She has a lot of pain on her left leg, mainly her left ankle from knee down, throbbing, tooth ache, she noticed more tendency of left ankle plantar flexion.  She continue have intermittent migraine  headaches, more at the left retro-orbital  Area  UPDATE Dec 6th 2017: She responded very well to previous Botox injection, complains frequent migraine headaches, once a week, lasting 1-2 days, partial response to combination of cambia, Fioricet, Compazine, she is taking Topamax ER 150 mg as preventive medications  UPDATE  June 30 2016: She had her first Botox injection for migraine prevention in December 2017, with only used 75 units she reported dramatic improvement, she only has migraines about once a week, which is milder, better controlled by the medication, instead of 2-3 times more severe headaches, at the end of the benefit, she noticed recurrent of her headaches, she also noticed mild left shoulder stiffness, left ankle turned in,  She noticed worsening right knee pain, recently had right knee injection.  Update November 04 2016: She responded well to previous injection, Botox injection to bilateral frontal temporal region has helped her headache,  UPDATE Mar 11 2017: She responded well to previous injection in August 2018.  Reported increased migraine headache over the past few weeks,  UPDATE Aug 08 2017: She is having migraine almost every week, lasting for 2 days, she is hesitate   UPDATE Sept 30 2019: She missed her appointment in August, she was given toradol and pheneran by pcp recently for protracted migraine headache, which did help her headache, but less effective than Depacon  She continue have 1 or 2 migraine headaches 1 to 2 weeks, also noticed increased left ankle spasticity, unsteady gait  UPDATE Mar 23 2018: She still has frequent headaches, want to explore the possibility of CGRP, responded very well to previous injection,  UPDATE Aug 23 2018: She responded very well to previous injection, only has occasional migraine headaches  UPDATE Sept 2 2020: She responded well to previous injection, had occasionally migraine headaches, Cambia works well for her, left hand finger spasm has much improved, she continue has mild left ankle plantarflexion,  Review of system: Pertinent as above   ALLERGIES: Allergies  Allergen Reactions  . Penicillins Anaphylaxis and Hives  . Imitrex [Sumatriptan]   . Other Swelling    kiwi  . Sulfa Antibiotics Hives  . Zomig [Zolmitriptan]     HOME  MEDICATIONS: Current Outpatient Medications  Medication Sig Dispense Refill  . albuterol (PROVENTIL HFA) 108 (90 Base) MCG/ACT inhaler 1 to 2 inhalations 10-15 minutes apart every 4 hours if needed for asthma rescue 48 g 3  . ALPRAZolam (XANAX) 1 MG tablet Take 1/2 tablet to 1 tablet two to three times a day as needed for anxiety. Limit to 5 days a week to avoid addiction. 30 tablet 0  . aspirin EC 81 MG tablet Take 81 mg by mouth daily.    . Botulinum Toxin Type A (BOTOX) 200 UNITS SOLR Inject 400 Units as directed every 3 (three) months. Change to BOTOX A 100  units    . BREO ELLIPTA 100-25 MCG/INH AEPB 1 INHALATION DAILY TO PREVENT ASTHMA 180 each 3  . buPROPion (WELLBUTRIN XL) 300 MG 24 hr tablet TAKE 1 TABLET (300 MG TOTAL) BY MOUTH DAILY. WITH FOOD 90 tablet 3  . butalbital-acetaminophen-caffeine (FIORICET, ESGIC) 50-325-40 MG tablet Take 1 tablet by mouth daily as needed for headache. Do not refill in less than 30 days, 12 tablet 5  . cetirizine (ZYRTEC) 10 MG tablet Take 10 mg by mouth daily.    . Diclofenac Potassium (CAMBIA) 50 MG PACK TAKE 1 PACKET AT ONSET OF MIGRAINE. CAN REPEAT IN 2 HR ONCE IF NEEDED. NO  MORE THAN 2 DOSES IN 24HR. 8 each 11  . Estradiol (YUVAFEM) 10 MCG TABS vaginal tablet Place 10 mcg vaginally 2 (two) times a week.    . fenofibrate micronized (LOFIBRA) 134 MG capsule TAKE 1 CAPSULE EVERY DAY 90 capsule 3  . levothyroxine (SYNTHROID, LEVOTHROID) 88 MCG tablet TAKE 1 TABLET BY MOUTH EVERY DAY 90 tablet 1  . magnesium oxide (MAG-OX) 400 MG tablet Take 400 mg by mouth 2 (two) times daily.     . montelukast (SINGULAIR) 10 MG tablet TAKE 1 TABLET BY MOUTH EVERYDAY AT BEDTIME 90 tablet 1  . Multiple Vitamin (MULTIVITAMIN WITH MINERALS) TABS tablet Take 1 tablet by mouth daily.    . nortriptyline (PAMELOR) 25 MG capsule TAKE 2 CAPSULES (50 MG TOTAL) BY MOUTH AT BEDTIME. 180 capsule 3  . omeprazole (PRILOSEC) 40 MG capsule Take 1 capsule Daily for Acid Indigestion & Reflux  90 capsule 1  . predniSONE (DELTASONE) 20 MG tablet 1 tab 3 x day for 3 days, then 1 tab 2 x day for 3 days, then 1 tab 1 x day for 5 days 20 tablet 1  . prochlorperazine (COMPAZINE) 5 MG tablet TAKE 1 TABLET BY MOUTH 3 TIMES A DAY AS NEEDED FOR NAUSEA 50 tablet 1  . Riboflavin 100 MG TABS Take 100 mg by mouth. bid    . rosuvastatin (CRESTOR) 40 MG tablet Take 1 tablet (40 mg total) by mouth daily. 90 tablet 1  . topiramate (TOPAMAX) 100 MG tablet Take 1.5 tablets (150 mg total) by mouth daily. (Patient taking differently: Take 150 mg by mouth daily. Takes 2 tablets twice daily) 135 tablet 3  . verapamil (CALAN-SR) 240 MG CR tablet Take 1 tablet Daily with Food for BP & Migraine Prevention 90 tablet 3  . vitamin B-12 (CYANOCOBALAMIN) 1000 MCG tablet Take 1,000 mcg by mouth daily.    . Vitamin D, Ergocalciferol, (DRISDOL) 1.25 MG (50000 UT) CAPS capsule TAKE 1 CAPSULE EVERY DAY 90 capsule 1  . amphetamine-dextroamphetamine (ADDERALL) 10 MG tablet Take 1/2 to 1 tablet 1 or 2 x daily for ADD 60 tablet 0   No current facility-administered medications for this visit.     PAST MEDICAL HISTORY: Past Medical History:  Diagnosis Date  . Anemia   . Anxiety   . Asthma   . Chronic heartburn   . Depression   . Elevated cholesterol    on Crestor  . Headache(784.0)   . Hypertension   . Hypothyroidism    S/P thyroidectomy  . Migraine   . Occlusion and stenosis of carotid artery with cerebral infarction   . Spasm of muscle   . Unspecified cerebral artery occlusion with cerebral infarction   . Vitamin D deficiency     PAST SURGICAL HISTORY: Past Surgical History:  Procedure Laterality Date  . AUGMENTATION MAMMAPLASTY Bilateral 2000  . COSMETIC SURGERY    . CYSTOCELE REPAIR N/A 01/08/2013   Procedure: ANTERIOR REPAIR (CYSTOCELE);  Surgeon: Margarette Asal, MD;  Location: [facility];  Service: Gynecology;  Laterality: N/A;  . NASAL SINUS SURGERY    . VAGINAL HYSTERECTOMY      FAMILY HISTORY:  Family History  Problem Relation Age of Onset  . Heart disease Mother   . Peptic Ulcer Disease Mother   . Heart disease Father   . Parkinson's disease Father     SOCIAL HISTORY:  Social History   Socioeconomic History  . Marital status: Married    Spouse name: Richardson Landry  . Number  of children: 2  . Years of education: college  . Highest education level: Not on file  Occupational History  . Occupation: DESIGNER    Employer: SELF  Social Needs  . Financial resource strain: Not on file  . Food insecurity    Worry: Not on file    Inability: Not on file  . Transportation needs    Medical: Not on file    Non-medical: Not on file  Tobacco Use  . Smoking status: Never Smoker  . Smokeless tobacco: Never Used  Substance and Sexual Activity  . Alcohol use: Yes    Comment: occassionally drinks a glass of wine  . Drug use: No  . Sexual activity: Not on file  Lifestyle  . Physical activity    Days per week: Not on file    Minutes per session: Not on file  . Stress: Not on file  Relationships  . Social Musicianconnections    Talks on phone: Not on file    Gets together: Not on file    Attends religious service: Not on file    Active member of club or organization: Not on file    Attends meetings of clubs or organizations: Not on file    Relationship status: Not on file  . Intimate partner violence    Fear of current or ex partner: Not on file    Emotionally abused: Not on file    Physically abused: Not on file    Forced sexual activity: Not on file  Other Topics Concern  . 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 Patient is married and lives with her husband. Patient has college education.   Right handed.   Caffeine- two cups daily.     PHYSICAL EXAM   Vitals:   11/29/18 1433  BP: 124/72  Pulse: 88  Temp: 97.8 F (36.6 C)  Weight: 164 lb (74.4 kg)  Height: 5\' 9"  (1.753 m)    Not recorded      Body mass index is  24.22 kg/m.      NEUROLOGICAL EXAM:  MENTAL STATUS: Speech:    Speech is normal; fluent and spontaneous with normal comprehension.  Cognition:     Orientation to time, place and person     Normal recent and remote memory     Normal Attention span and concentration     Normal Language, naming, repeating,spontaneous speech     Fund of knowledge   CRANIAL NERVES: CN II: Visual fields are full to confrontation.  Pupils are round equal and briskly reactive to light. CN III, IV, VI: extraocular movement are normal. No ptosis. CN V: Facial sensation is intact to pinprick in all 3 divisions bilaterally. Corneal responses are intact.  CN VII: Face is symmetric with normal eye closure and smile. CN VIII: Hearing is normal to rubbing fingers CN IX, X: Palate elevates symmetrically. Phonation is normal. CN XI: Head turning and shoulder shrug are intact CN XII: Tongue is midline with normal movements and no atrophy.  MOTOR: Mild spastic left hemiparesis, left upper extremity proximal and distal 4/5, mild left hip flexion and left ankle dorsiflexion weakness  REFLEXES: Reflexes are 2+ and symmetric at the biceps, triceps, knees, and ankles. Plantar responses are flexor.  SENSORY: Intact to light touch, pinprick, position sense, and vibration sense are intact in fingers and toes.  COORDINATION: Rapid alternating movements and fine finger movements are intact. There is no dysmetria on  finger-to-nose and heel-knee-shin.    GAIT/STANCE: Mildly unsteady gait, checking her left foot across the floor  Romberg is absent.   DIAGNOSTIC DATA (LABS, IMAGING, TESTING) - I reviewed patient records, labs, notes, testing and imaging myself where available.   ASSESSMENT AND PLAN  Ruth Gregory is a 58 y.o. female   Chronic migraine headaches  Continue preventive medications, nortriptyline 25 mg every night, topiramate 200 mg daily  Cambia, Fioricet, Compazine as needed,+ tizanidine prn  Continue  magnesium oxide, riboflavin as preventive medications,   Spastic left hemiparesis following right internal carotid artery dissection, right MCA stroke  EMG guided Botox injection, used 400 units of Botox A total   Left pronator teres 25 units Left flexor digitorum profoundi 25 units Left brachialis 50 units Left pectoralis major 25 units Left latissimus dorsi 25 units  Left tibialis posterior 75 units Left flexor digitorum longus 75 units  I also injected  100 units for chronic migraine headache  Left corrugate 5 units Right corrugate 5 units Prosperous 5 units Frontalis 20 Temporalis 40 units Occipital 25 units     Levert Feinstein, M.D. Ph.D.  [facility] [address] [place], [place] [address] Ph: [phone] Fax: [phone]  CC: Referring Provider

## 2018-11-29 NOTE — Progress Notes (Signed)
**  Botox 200 units x 2 vials, [place] 6761-9509-32, Lot [id number], Exp 07/2021, office supply.//mck,rn**

## 2018-12-05 ENCOUNTER — Encounter: Payer: Self-pay | Admitting: Adult Health Nurse Practitioner

## 2018-12-05 ENCOUNTER — Other Ambulatory Visit: Payer: Self-pay

## 2018-12-05 ENCOUNTER — Ambulatory Visit (INDEPENDENT_AMBULATORY_CARE_PROVIDER_SITE_OTHER): Payer: BC Managed Care – PPO | Admitting: Adult Health Nurse Practitioner

## 2018-12-05 ENCOUNTER — Other Ambulatory Visit: Payer: Self-pay | Admitting: Adult Health Nurse Practitioner

## 2018-12-05 VITALS — BP 122/80 | HR 63 | Temp 97.5°F | Ht 69.0 in | Wt 166.0 lb

## 2018-12-05 DIAGNOSIS — R300 Dysuria: Secondary | ICD-10-CM

## 2018-12-05 DIAGNOSIS — R350 Frequency of micturition: Secondary | ICD-10-CM

## 2018-12-05 NOTE — Patient Instructions (Signed)
Continue drinking at least 80oz of water a day.    You can continue the AZO once a day.  Once we get the lab results we will send you a message via [place].  The culture takes 3 days.  If you have anew or worsening symptoms please let us know.      Urinary Tract Infection, Adult A urinary tract infection (UTI) is an infection of any part of the urinary tract. The urinary tract includes:  The kidneys.  The ureters.  The bladder.  The urethra. These organs make, store, and get rid of pee (urine) in the body. What are the causes? This is caused by germs (bacteria) in your genital area. These germs grow and cause swelling (inflammation) of your urinary tract. What increases the risk? You are more likely to develop this condition if:  You have a small, thin tube (catheter) to drain pee.  You cannot control when you pee or poop (incontinence).  You are female, and: ? You use these methods to prevent pregnancy: ? A medicine that kills sperm (spermicide). ? A device that blocks sperm (diaphragm). ? You have low levels of a female hormone (estrogen). ? You are pregnant.  You have genes that add to your risk.  You are sexually active.  You take antibiotic medicines.  You have trouble peeing because of: ? A prostate that is bigger than normal, if you are female. ? A blockage in the part of your body that drains pee from the bladder (urethra). ? A kidney stone. ? A nerve condition that affects your bladder (neurogenic bladder). ? Not getting enough to drink. ? Not peeing often enough.  You have other conditions, such as: ? Diabetes. ? A weak disease-fighting system (immune system). ? Sickle cell disease. ? Gout. ? Injury of the spine. What are the signs or symptoms? Symptoms of this condition include:  Needing to pee right away (urgently).  Peeing often.  Peeing small amounts often.  Pain or burning when peeing.  Blood in the pee.  Pee that smells bad or  not like normal.  Trouble peeing.  Pee that is cloudy.  Fluid coming from the vagina, if you are female.  Pain in the belly or lower back. Other symptoms include:  Throwing up (vomiting).  No urge to eat.  Feeling mixed up (confused).  Being tired and grouchy (irritable).  A fever.  Watery poop (diarrhea). How is this treated? This condition may be treated with:  Antibiotic medicine.  Other medicines.  Drinking enough water. Follow these instructions at home:  Medicines  Take over-the-counter and prescription medicines only as told by your doctor.  If you were prescribed an antibiotic medicine, take it as told by your doctor. Do not stop taking it even if you start to feel better. General instructions  Make sure you: ? Pee until your bladder is empty. ? Do not hold pee for a long time. ? Empty your bladder after sex. ? Wipe from front to back after pooping if you are a female. Use each tissue one time when you wipe.  Drink enough fluid to keep your pee pale yellow.  Keep all follow-up visits as told by your doctor. This is important. Contact a doctor if:  You do not get better after 1-2 days.  Your symptoms go away and then come back. Get help right away if:  You have very bad back pain.  You have very bad pain in your lower belly.  You  have a fever.  You are sick to your stomach (nauseous).  You are throwing up. Summary  A urinary tract infection (UTI) is an infection of any part of the urinary tract.  This condition is caused by germs in your genital area.  There are many risk factors for a UTI. These include having a small, thin tube to drain pee and not being able to control when you pee or poop.  Treatment includes antibiotic medicines for germs.  Drink enough fluid to keep your pee pale yellow. This information is not intended to replace advice given to you by your health care provider. Make sure you discuss any questions you have with  your health care provider. Document Released: 09/01/2007 Document Revised: 03/02/2018 Document Reviewed: 09/22/2017 Elsevier Patient Education  2020 [employer]

## 2018-12-05 NOTE — Progress Notes (Signed)
Assessment and Plan:  Ruth Gregory was seen today for acute visit and urinary tract infection.  Diagnoses and all orders for this visit:  Dysuria -     Urinalysis w microscopic + reflex cultur Discussed using OTC AZO for 3-4 days Increase water intake, 80oz of more daily  Frequency of micturition -     Urinalysis w microscopic + reflex cultur Continue to monitor symptoms.   Further disposition pending results of labs. Discussed med's effects and SE's.   Over 30 minutes of exam, counseling, chart review, and critical decision making was performed.   Future Appointments  Date Time Provider [place]  12/05/2018  4:00 PM Garnet Sierras, NP [facility] None  12/28/2018 12:40 PM [facility] MM 3 [facility]  02/15/2019 10:45 AM Unk Pinto, MD [facility] None  03/07/2019  3:00 PM Marcial Pacas, MD [facility] None  08/14/2019  2:00 PM Unk Pinto, MD [facility] None    ------------------------------------------------------------------------------------------------------------------   HPI 58 y.o.female presents for evaluation for urinary symptoms.  She reports that her symptoms started four days ago.  She is having frequency, burning.  It does not wake her up at night.  She is drinking 8, 12oz bottles a day to help and some days a full gallon of water.  She is taking OTC AZO once a day and this has helped with the symptoms. She also has a mesh sling after bladder tack.   She reports that she had a vaginal ultrasound one week ago to check for ovaries concern for ovarian cancer.  She reports she had cramping after this exam.  Final reports showed ovaries were normal size.  And also had vaginal exam at that time and no abnormalities noted.    Past Medical History:  Diagnosis Date  . Anemia   . Anxiety   . Asthma   . Chronic heartburn   . Depression   . Elevated cholesterol    on Crestor  . Headache(784.0)   . Hypertension   . Hypothyroidism    S/P thyroidectomy  .  Migraine   . Occlusion and stenosis of carotid artery with cerebral infarction   . Spasm of muscle   . Unspecified cerebral artery occlusion with cerebral infarction   . Vitamin D deficiency      Allergies  Allergen Reactions  . Penicillins Anaphylaxis and Hives  . Imitrex [Sumatriptan]   . Other Swelling    kiwi  . Sulfa Antibiotics Hives  . Zomig [Zolmitriptan]     Current Outpatient Medications on File Prior to Visit  Medication Sig  . albuterol (PROVENTIL HFA) 108 (90 Base) MCG/ACT inhaler 1 to 2 inhalations 10-15 minutes apart every 4 hours if needed for asthma rescue  . ALPRAZolam (XANAX) 1 MG tablet Take 1/2 tablet to 1 tablet two to three times a day as needed for anxiety. Limit to 5 days a week to avoid addiction.  Marland Kitchen amphetamine-dextroamphetamine (ADDERALL) 10 MG tablet Take 1/2 to 1 tablet 1 or 2 x daily for ADD  . aspirin EC 81 MG tablet Take 81 mg by mouth daily.  . Botulinum Toxin Type A (BOTOX) 200 UNITS SOLR Inject 400 Units as directed every 3 (three) months. Change to BOTOX A 100  units  . BREO ELLIPTA 100-25 MCG/INH AEPB 1 INHALATION DAILY TO PREVENT ASTHMA  . buPROPion (WELLBUTRIN XL) 300 MG 24 hr tablet TAKE 1 TABLET (300 MG TOTAL) BY MOUTH DAILY. WITH FOOD  . butalbital-acetaminophen-caffeine (FIORICET, ESGIC) 50-325-40 MG tablet Take 1 tablet by mouth daily  as needed for headache. Do not refill in less than 30 days,  . cetirizine (ZYRTEC) 10 MG tablet Take 10 mg by mouth daily.  . Diclofenac Potassium (CAMBIA) 50 MG PACK TAKE 1 PACKET AT ONSET OF MIGRAINE. CAN REPEAT IN 2 HR ONCE IF NEEDED. NO MORE THAN 2 DOSES IN 24HR.  Marland Kitchen. Estradiol (YUVAFEM) 10 MCG TABS vaginal tablet Place 10 mcg vaginally 2 (two) times a week.  . fenofibrate micronized (LOFIBRA) 134 MG capsule TAKE 1 CAPSULE EVERY DAY  . levothyroxine (SYNTHROID, LEVOTHROID) 88 MCG tablet TAKE 1 TABLET BY MOUTH EVERY DAY  . magnesium oxide (MAG-OX) 400 MG tablet Take 400 mg by mouth 2 (two) times daily.   .  montelukast (SINGULAIR) 10 MG tablet TAKE 1 TABLET BY MOUTH EVERYDAY AT BEDTIME  . Multiple Vitamin (MULTIVITAMIN WITH MINERALS) TABS tablet Take 1 tablet by mouth daily.  . nortriptyline (PAMELOR) 25 MG capsule TAKE 2 CAPSULES (50 MG TOTAL) BY MOUTH AT BEDTIME.  Marland Kitchen. omeprazole (PRILOSEC) 40 MG capsule Take 1 capsule Daily for Acid Indigestion & Reflux  . predniSONE (DELTASONE) 20 MG tablet 1 tab 3 x day for 3 days, then 1 tab 2 x day for 3 days, then 1 tab 1 x day for 5 days  . prochlorperazine (COMPAZINE) 5 MG tablet TAKE 1 TABLET BY MOUTH 3 TIMES A DAY AS NEEDED FOR NAUSEA  . Riboflavin 100 MG TABS Take 100 mg by mouth. bid  . rosuvastatin (CRESTOR) 40 MG tablet Take 1 tablet (40 mg total) by mouth daily.  Marland Kitchen. topiramate (TOPAMAX) 100 MG tablet Take 1.5 tablets (150 mg total) by mouth daily. (Patient taking differently: Take 150 mg by mouth daily. Takes 2 tablets twice daily)  . verapamil (CALAN-SR) 240 MG CR tablet Take 1 tablet Daily with Food for BP & Migraine Prevention  . vitamin B-12 (CYANOCOBALAMIN) 1000 MCG tablet Take 1,000 mcg by mouth daily.  . Vitamin D, Ergocalciferol, (DRISDOL) 1.25 MG (50000 UT) CAPS capsule TAKE 1 CAPSULE EVERY DAY   No current facility-administered medications on file prior to visit.     ROS: Review of Systems  Respiratory: Negative for cough, hemoptysis, sputum production, shortness of breath and wheezing.   Gastrointestinal: Negative for abdominal pain, blood in stool, constipation, diarrhea, heartburn, melena, nausea and vomiting.  Genitourinary: Positive for dysuria and frequency. Negative for flank pain, hematuria and urgency.  Musculoskeletal: Negative for back pain, falls, joint pain, myalgias and neck pain.  Skin: Negative for itching and rash.     Physical Exam:  There were no vitals taken for this visit.  General Appearance: Well nourished, in no apparent distress. Eyes: PERRLA, EOMs, conjunctiva no swelling or erythema Sinuses: No  Frontal/maxillary tenderness ENT/Mouth: Ext aud canals clear, TMs without erythema, bulging. No erythema, swelling, or exudate on post pharynx.  Tonsils not swollen or erythematous. Hearing normal.  Neck: Supple, thyroid normal.  Respiratory: Respiratory effort normal, BS equal bilaterally without rales, rhonchi, wheezing or stridor.  Cardio: RRR with no MRGs. Brisk peripheral pulses without edema.  Abdomen: Soft, + BS.  Non tender, no guarding, rebound, hernias, masses. Lymphatics: Non tender without lymphadenopathy.  Musculoskeletal: Full ROM, 5/5 strength, normal gait.  Skin: Warm, dry without rashes, lesions, ecchymosis.  Neuro: Cranial nerves intact. Normal muscle tone, no cerebellar symptoms. Sensation intact.  Psych: Awake and oriented X 3, normal affect, Insight and Judgment appropriate.     Elder NegusKyra Priyana Mccarey, NP 2:58 PM [facility]

## 2018-12-07 LAB — URINALYSIS W MICROSCOPIC + REFLEX CULTURE
Bilirubin Urine: NEGATIVE
Glucose, UA: NEGATIVE
Hgb urine dipstick: NEGATIVE
Hyaline Cast: NONE SEEN /LPF
Ketones, ur: NEGATIVE
Nitrites, Initial: NEGATIVE
Protein, ur: NEGATIVE
Specific Gravity, Urine: 1.016 (ref 1.001–1.03)
pH: 7.5 (ref 5.0–8.0)

## 2018-12-07 LAB — URINE CULTURE
MICRO NUMBER:: 860466
SPECIMEN QUALITY:: ADEQUATE

## 2018-12-07 LAB — CULTURE INDICATED

## 2018-12-12 ENCOUNTER — Telehealth: Payer: Self-pay | Admitting: *Deleted

## 2018-12-12 DIAGNOSIS — G43709 Chronic migraine without aura, not intractable, without status migrainosus: Secondary | ICD-10-CM | POA: Diagnosis not present

## 2018-12-12 NOTE — Telephone Encounter (Signed)
Last infusion on 10/03/2018 with the following medications:  1)Depakote 1gram 2)Toradol 30mg  3)Compazine 5mg 

## 2018-12-12 NOTE — Telephone Encounter (Signed)
I spoke to the patient and she will be here around 2:30pm for her infusion.  Her husband will be driving her.  Signed orders provided to [facility].

## 2018-12-12 NOTE — Telephone Encounter (Signed)
Ok for infusion

## 2018-12-12 NOTE — Telephone Encounter (Signed)
Pt has called and stated she is on day #2 of her typical migraine. The barometric pressure is bothering her this time and she is concerned it will get even worse. She would like to receive a migraine infusion. She can come this afternoon or tomorrow if needed. She confirmed her allergies and is amenable to receiving the same medications as last time if authorized by MD. She will arrange transportation if this can be scheduled.

## 2018-12-15 ENCOUNTER — Other Ambulatory Visit: Payer: Self-pay | Admitting: Internal Medicine

## 2018-12-15 DIAGNOSIS — E039 Hypothyroidism, unspecified: Secondary | ICD-10-CM

## 2018-12-18 ENCOUNTER — Telehealth: Payer: Self-pay | Admitting: Neurology

## 2018-12-18 NOTE — Telephone Encounter (Signed)
Reports developing a mild, left-sided eye droop on 12/13/2018.  States it becomes worse by the end of the day.  She is able to blink and close her eye normally.   She had Botox on 11/29/2018. She is aware this is a potential side effect of having Botox in that area.  She verbalized understanding that this symptom should improve gradually over the next few months.  She will call us if it worsens.

## 2018-12-18 NOTE — Telephone Encounter (Signed)
Pt would like to talk to RN. Pt states after she got her Botox her eyes started getting droopy. Please advise.

## 2018-12-21 ENCOUNTER — Other Ambulatory Visit: Payer: Self-pay | Admitting: Internal Medicine

## 2018-12-28 ENCOUNTER — Ambulatory Visit
Admission: RE | Admit: 2018-12-28 | Discharge: 2018-12-28 | Disposition: A | Discharge status: Home / Self Care | Payer: BC Managed Care – PPO | Source: Ambulatory Visit | Attending: Internal Medicine | Admitting: Internal Medicine

## 2018-12-28 ENCOUNTER — Other Ambulatory Visit: Payer: Self-pay

## 2018-12-28 DIAGNOSIS — Z1231 Encounter for screening mammogram for malignant neoplasm of breast: Secondary | ICD-10-CM | POA: Diagnosis not present

## 2019-01-02 ENCOUNTER — Other Ambulatory Visit: Payer: Self-pay | Admitting: Internal Medicine

## 2019-01-02 DIAGNOSIS — F419 Anxiety disorder, unspecified: Secondary | ICD-10-CM

## 2019-01-10 ENCOUNTER — Other Ambulatory Visit: Payer: Self-pay

## 2019-01-10 ENCOUNTER — Ambulatory Visit (INDEPENDENT_AMBULATORY_CARE_PROVIDER_SITE_OTHER): Payer: BC Managed Care – PPO | Admitting: *Deleted

## 2019-01-10 VITALS — Temp 97.3°F

## 2019-01-10 DIAGNOSIS — Z23 Encounter for immunization: Secondary | ICD-10-CM | POA: Diagnosis not present

## 2019-01-12 ENCOUNTER — Other Ambulatory Visit: Payer: Self-pay | Admitting: Adult Health

## 2019-01-12 ENCOUNTER — Other Ambulatory Visit: Payer: Self-pay | Admitting: Neurology

## 2019-01-15 ENCOUNTER — Other Ambulatory Visit: Payer: Self-pay

## 2019-01-15 DIAGNOSIS — F988 Other specified behavioral and emotional disorders with onset usually occurring in childhood and adolescence: Secondary | ICD-10-CM

## 2019-01-15 MED ORDER — AMPHETAMINE-DEXTROAMPHETAMINE 10 MG PO TABS: ORAL_TABLET | ORAL | 0 refills | Status: DC

## 2019-01-15 NOTE — Telephone Encounter (Signed)
Refill request for Adderall, 10mg . In que for your review.

## 2019-01-29 ENCOUNTER — Other Ambulatory Visit: Payer: Self-pay | Admitting: Internal Medicine

## 2019-01-29 MED ORDER — PREDNISONE 20 MG PO TABS: ORAL_TABLET | ORAL | 1 refills | Status: DC

## 2019-02-07 ENCOUNTER — Ambulatory Visit: Payer: BLUE CROSS/BLUE SHIELD | Admitting: Internal Medicine

## 2019-02-15 ENCOUNTER — Ambulatory Visit: Payer: BC Managed Care – PPO | Admitting: Internal Medicine

## 2019-02-23 ENCOUNTER — Other Ambulatory Visit: Payer: Self-pay | Admitting: Internal Medicine

## 2019-02-23 MED ORDER — DEXAMETHASONE 4 MG PO TABS: ORAL_TABLET | ORAL | 0 refills | Status: DC

## 2019-02-24 ENCOUNTER — Other Ambulatory Visit: Payer: Self-pay | Admitting: Internal Medicine

## 2019-02-24 DIAGNOSIS — Z20828 Contact with and (suspected) exposure to other viral communicable diseases: Secondary | ICD-10-CM | POA: Diagnosis not present

## 2019-02-24 MED ORDER — AZITHROMYCIN 250 MG PO TABS: ORAL_TABLET | ORAL | 1 refills | Status: DC

## 2019-03-01 ENCOUNTER — Telehealth: Payer: Self-pay | Admitting: Neurology

## 2019-03-01 ENCOUNTER — Encounter: Payer: Self-pay | Admitting: *Deleted

## 2019-03-01 NOTE — Telephone Encounter (Signed)
Please reschedule her appointment at or after week of Mar 12 2019

## 2019-03-01 NOTE — Telephone Encounter (Signed)
I called the patient and left her a message that we had to cancel her 03/07/2019 appt.  It has been rescheduled to 03/15/2019 at 9am.  I requested she arrive at 8:45am for check-in.  I also sent this information through a mychart message.

## 2019-03-01 NOTE — Telephone Encounter (Signed)
Pt called stating that yesterday they confirmed her as COVID positive but she took the test on Nov. 27th and she is to finish her quarantine on December 8th. Pt would like to know if she has to r/s her BOTOX appt due to her just being confirmed. Please advise.

## 2019-03-01 NOTE — Telephone Encounter (Signed)
Noted, thank you. DW  °

## 2019-03-06 ENCOUNTER — Ambulatory Visit (INDEPENDENT_AMBULATORY_CARE_PROVIDER_SITE_OTHER): Payer: BC Managed Care – PPO | Admitting: Internal Medicine

## 2019-03-06 ENCOUNTER — Encounter: Payer: Self-pay | Admitting: Internal Medicine

## 2019-03-06 ENCOUNTER — Other Ambulatory Visit: Payer: Self-pay

## 2019-03-06 VITALS — BP 112/84 | HR 76 | Temp 97.2°F | Resp 16 | Ht 69.0 in | Wt 158.6 lb

## 2019-03-06 DIAGNOSIS — R7309 Other abnormal glucose: Secondary | ICD-10-CM | POA: Diagnosis not present

## 2019-03-06 DIAGNOSIS — Z8673 Personal history of transient ischemic attack (TIA), and cerebral infarction without residual deficits: Secondary | ICD-10-CM

## 2019-03-06 DIAGNOSIS — R7303 Prediabetes: Secondary | ICD-10-CM

## 2019-03-06 DIAGNOSIS — I10 Essential (primary) hypertension: Secondary | ICD-10-CM

## 2019-03-06 DIAGNOSIS — Z79899 Other long term (current) drug therapy: Secondary | ICD-10-CM | POA: Diagnosis not present

## 2019-03-06 DIAGNOSIS — E890 Postprocedural hypothyroidism: Secondary | ICD-10-CM

## 2019-03-06 DIAGNOSIS — E782 Mixed hyperlipidemia: Secondary | ICD-10-CM

## 2019-03-06 DIAGNOSIS — E559 Vitamin D deficiency, unspecified: Secondary | ICD-10-CM

## 2019-03-06 DIAGNOSIS — N1831 Chronic kidney disease, stage 3a: Secondary | ICD-10-CM

## 2019-03-06 NOTE — Patient Instructions (Signed)

## 2019-03-06 NOTE — Progress Notes (Signed)
History of Present Illness:      This very nice 58 y.o. MWF  presents for 6 month follow up with HTN, CKD3, HLD, Pre-Diabetes and Vitamin D Deficiency. Patient has hx/o a Rt MCA stroke in 2009 attributed to Carotid Aa dissection speculated Fibromuscular Dysplasia. She has residual mild spastic Lt HP and also migraines followed by Dr Krista Blue . Patient also has GERD controlled on her meds. Further, she has hx/o Asthma for which she uses rescue inhalers infrequently.       Patients husband tested (+) for Covid 12 days ago on 11/25 and she had same sx's of URI/LRI.       Patient is treated for mild HTN w/Verapamil concomitantly prophylaxing her migraines.  BP has been controlled at home. Today's  . Patient has had no complaints of any cardiac type chest pain, palpitations, dyspnea / orthopnea / PND, dizziness, claudication, or dependent edema.      Hyperlipidemia is controlled with diet & meds. Patient denies myalgias or other med SE's. Last Lipids were not at goal:  Lab Results  Component Value Date   CHOL 190 11/15/2018   HDL 50 11/15/2018   LDLCALC 103 (H) 11/15/2018   TRIG 257 (H) 11/15/2018   CHOLHDL 3.8 11/15/2018        Also, the patient has history of  PreDiabetes (A1c 5.8% / 2008) and has had no symptoms of reactive hypoglycemia, diabetic polys, paresthesias or visual blurring.  Last A1c was Normal & at goal:  Lab Results  Component Value Date   HGBA1C 5.5 07/27/2018       Patient is on Thyroid replacement s/p Thyroid surgery.       Further, the patient also has history of Vitamin D Deficiency ("23" / 2008) and supplements vitamin D without any suspected side-effects. Last vitamin D was slightly elevated and dose was decreased:  Lab Results  Component Value Date   VD25OH 119 (H) 07/27/2018    Current Outpatient Medications on File Prior to Visit  Medication Sig  . albuterol (PROVENTIL HFA) 108 (90 Base) MCG/ACT inhaler 1 to 2 inhalations 10-15 minutes apart every 4  hours if needed for asthma rescue  . ALPRAZolam (XANAX) 1 MG tablet TAKE 1/2 TABLET TO 1 TABLET TWO TO THREE TIMES A DAY AS NEEDED FOR ANXIETY. LIMIT TO 5 DAYS A WEEK TO AVOID ADDICTION.  Marland Kitchen amphetamine-dextroamphetamine (ADDERALL) 10 MG tablet Take 1/2 to 1 tablet 1 or 2 x daily for ADD  . aspirin EC 81 MG tablet Take 81 mg by mouth daily.  . Botulinum Toxin Type A (BOTOX) 200 UNITS SOLR Inject 400 Units as directed every 3 (three) months. Change to BOTOX A 100  units  . BREO ELLIPTA 100-25 MCG/INH AEPB 1 INHALATION DAILY TO PREVENT ASTHMA  . buPROPion (WELLBUTRIN XL) 300 MG 24 hr tablet TAKE 1 TABLET (300 MG TOTAL) BY MOUTH DAILY. WITH FOOD  . butalbital-acetaminophen-caffeine (FIORICET) 50-325-40 MG tablet TAKE 1 TABLET BY MOUTH DAILY AS NEEDED FOR HEADACHE. DO NOT REFILL IN LESS THAN 30 DAYS,  . cetirizine (ZYRTEC) 10 MG tablet Take 10 mg by mouth daily.  . Diclofenac Potassium (CAMBIA) 50 MG PACK TAKE 1 PACKET AT ONSET OF MIGRAINE. CAN REPEAT IN 2 HR ONCE IF NEEDED. NO MORE THAN 2 DOSES IN 24HR.  Marland Kitchen Estradiol (YUVAFEM) 10 MCG TABS vaginal tablet Place 10 mcg vaginally 2 (two) times a week.  . fenofibrate micronized (LOFIBRA) 134 MG capsule TAKE 1 CAPSULE EVERY DAY  .  levothyroxine (SYNTHROID) 88 MCG tablet TAKE 1 TABLET BY MOUTH EVERY DAY  . magnesium oxide (MAG-OX) 400 MG tablet Take 400 mg by mouth 2 (two) times daily.   . montelukast (SINGULAIR) 10 MG tablet Take 1 tablet at bedtime for allergies.  . Multiple Vitamin (MULTIVITAMIN WITH MINERALS) TABS tablet Take 1 tablet by mouth daily.  . nortriptyline (PAMELOR) 25 MG capsule TAKE 2 CAPSULES (50 MG TOTAL) BY MOUTH AT BEDTIME.  Marland Kitchen. omeprazole (PRILOSEC) 40 MG capsule TAKE 1 CAPSULE DAILY FOR ACID INDIGESTION & REFLUX  . predniSONE (DELTASONE) 20 MG tablet 1 tab 3 x day for 3 days, then 1 tab 2 x day for 3 days, then 1 tab 1 x day for 5 days  . prochlorperazine (COMPAZINE) 5 MG tablet TAKE 1 TABLET BY MOUTH 3 TIMES A DAY AS NEEDED FOR NAUSEA   . Riboflavin 100 MG TABS Take 100 mg by mouth. bid  . rosuvastatin (CRESTOR) 40 MG tablet Take 1 tablet (40 mg total) by mouth daily.  Marland Kitchen. topiramate (TOPAMAX) 100 MG tablet Take 1.5 tablets (150 mg total) by mouth daily. (Patient taking differently: Take 150 mg by mouth daily. Takes 2 tablets twice daily)  . verapamil (CALAN-SR) 240 MG CR tablet Take 1 tablet Daily with Food for BP & Migraine Prevention  . vitamin B-12 (CYANOCOBALAMIN) 1000 MCG tablet Take 1,000 mcg by mouth daily.  . Vitamin D, Ergocalciferol, (DRISDOL) 1.25 MG (50000 UT) CAPS capsule Take 1 capsule daily for Vitamin D deficiency.   No current facility-administered medications on file prior to visit.     Allergies  Allergen Reactions  . Penicillins Anaphylaxis and Hives  . Imitrex [Sumatriptan]   . Other Swelling    kiwi  . Sulfa Antibiotics Hives  . Zomig [Zolmitriptan]     PMHx:   Past Medical History:  Diagnosis Date  . Anemia   . Anxiety   . Asthma   . Chronic heartburn   . Depression   . Elevated cholesterol    on Crestor  . Headache(784.0)   . Hypertension   . Hypothyroidism    S/P thyroidectomy  . Migraine   . Occlusion and stenosis of carotid artery with cerebral infarction   . Spasm of muscle   . Unspecified cerebral artery occlusion with cerebral infarction   . Vitamin D deficiency    Immunization History  Administered Date(s) Administered  . Influenza Inj Mdck Quad With Preservative 01/20/2017, 01/09/2018, 01/10/2019  . Influenza Split 01/24/2014  . Influenza, Seasonal, Injecte, Preservative Fre 02/09/2016  . Influenza-Unspecified 11/28/2011  . PPD Test 04/04/2013, 04/22/2015, 05/18/2016, 06/20/2017, 07/27/2018  . Pneumococcal Polysaccharide-23 03/29/1998  . Td 03/29/2002  . Tdap 05/18/2016   Past Surgical History:  Procedure Laterality Date  . AUGMENTATION MAMMAPLASTY Bilateral 2000  . COSMETIC SURGERY    . CYSTOCELE REPAIR N/A 01/08/2013   Procedure: ANTERIOR REPAIR (CYSTOCELE);   Surgeon: Meriel Picaichard M Holland, MD;  Location: [facility];  Service: Gynecology;  Laterality: N/A;  . NASAL SINUS SURGERY    . VAGINAL HYSTERECTOMY      FHx:    Reviewed / unchanged  SHx:    Reviewed / unchanged   Systems Review:  Constitutional: Denies fever, chills, wt changes, headaches, insomnia, fatigue, night sweats, change in appetite. Eyes: Denies redness, blurred vision, diplopia, discharge, itchy, watery eyes.  ENT: Denies discharge, congestion, post nasal drip, epistaxis, sore throat, earache, hearing loss, dental pain, tinnitus, vertigo, sinus pain, snoring.  CV: Denies chest pain, palpitations, irregular heartbeat, syncope, dyspnea,  diaphoresis, orthopnea, PND, claudication or edema. Respiratory: denies cough, dyspnea, DOE, pleurisy, hoarseness, laryngitis, wheezing.  Gastrointestinal: Denies dysphagia, odynophagia, heartburn, reflux, water brash, abdominal pain or cramps, nausea, vomiting, bloating, diarrhea, constipation, hematemesis, melena, hematochezia  or hemorrhoids. Genitourinary: Denies dysuria, frequency, urgency, nocturia, hesitancy, discharge, hematuria or flank pain. Musculoskeletal: Denies arthralgias, myalgias, stiffness, jt. swelling, pain, limping or strain/sprain.  Skin: Denies pruritus, rash, hives, warts, acne, eczema or change in skin lesion(s). Neuro: No weakness, tremor, incoordination, spasms, paresthesia or pain. Psychiatric: Denies confusion, memory loss or sensory loss. Endo: Denies change in weight, skin or hair change.  Heme/Lymph: No excessive bleeding, bruising or enlarged lymph nodes.  Physical Exam  There were no vitals taken for this visit.  Appears  well nourished, well groomed  and in no distress.  Eyes: PERRLA, EOMs, conjunctiva no swelling or erythema. Sinuses: No frontal/maxillary tenderness ENT/Mouth: EAC's clear, TM's nl w/o erythema, bulging. Nares clear w/o erythema, swelling, exudates. Oropharynx clear without erythema or exudates.  Oral hygiene is good. Tongue normal, non obstructing. Hearing intact.  Neck: Supple. Thyroid not palpable. Car 2+/2+ without bruits, nodes or JVD. Chest: Respirations nl with BS clear & equal w/o rales, rhonchi, wheezing or stridor.  Cor: Heart sounds normal w/ regular rate and rhythm without sig. murmurs, gallops, clicks or rubs. Peripheral pulses normal and equal  without edema.  Abdomen: Soft & bowel sounds normal. Non-tender w/o guarding, rebound, hernias, masses or organomegaly.  Lymphatics: Unremarkable.  Musculoskeletal: Full ROM all peripheral extremities, joint stability, 5/5 strength and normal gait.  Skin: Warm, dry without exposed rashes, lesions or ecchymosis apparent.  Neuro: Cranial nerves intact, reflexes equal bilaterally. Sensory-motor testing grossly intact. Tendon reflexes grossly intact.  Pysch: Alert & oriented x 3.  Insight and judgement nl & appropriate. No ideations.  Assessment and Plan:  1. Essential hypertension  - Continue medication, monitor blood pressure at home.  - Continue DASH diet.  Reminder to go to the ER if any CP,  SOB, nausea, dizziness, severe HA, changes vision/speech.  - CBC with Diff - COMPLETE METABOLIC PANEL WITH GFR - Magnesium - TSH  2. Hyperlipidemia, mixed  - Continue diet/meds, exercise,& lifestyle modifications.  - Continue monitor periodic cholesterol/liver & renal functions   - Lipid Profile - TSH  3. Abnormal glucose  - Continue diet, exercise  - Lifestyle modifications.  - Monitor appropriate labs.  - Hemoglobin A1c ([facility]) - Insulin, random  4. Vitamin D deficiency  - Continue supplementation.  - Vitamin D (25 hydroxy)  5. Prediabetes  - Hemoglobin A1c ([facility]) - Insulin, random  6. History of CVA (cerebrovascular accident)  7. Stage 3a chronic kidney disease  - COMPLETE METABOLIC PANEL WITH GFR  8. Postoperative hypothyroidism  - TSH  9. Medication management  - CBC with Diff - COMPLETE  METABOLIC PANEL WITH GFR - Magnesium - Lipid Profile - TSH - Hemoglobin A1c ([facility]) - Insulin, random - Vitamin D (25 hydroxy)       Discussed  regular exercise, BP monitoring, weight control to achieve/maintain BMI less than 25 and discussed med and SE's. Recommended labs to assess and monitor clinical status with further disposition pending results of labs.  I discussed the assessment and treatment plan with the patient. The patient was provided an opportunity to ask questions and all were answered. The patient agreed with the plan and demonstrated an understanding of the instructions.  I provided over 30 minutes of exam, counseling, chart review and  complex critical decision making.  Chrissie Noa  Melton Krebs, MD

## 2019-03-07 ENCOUNTER — Ambulatory Visit: Payer: Self-pay | Admitting: Neurology

## 2019-03-07 LAB — CBC WITH DIFFERENTIAL/PLATELET
Absolute Monocytes: 290 cells/uL (ref 200–950)
Basophils Absolute: 20 cells/uL (ref 0–200)
Basophils Relative: 0.3 %
Eosinophils Absolute: 0 cells/uL — ABNORMAL LOW (ref 15–500)
Eosinophils Relative: 0 %
HCT: 44.2 % (ref 35.0–45.0)
Hemoglobin: 15.3 g/dL (ref 11.7–15.5)
Lymphs Abs: 680 cells/uL — ABNORMAL LOW (ref 850–3900)
MCH: 31.7 pg (ref 27.0–33.0)
MCHC: 34.6 g/dL (ref 32.0–36.0)
MCV: 91.5 fL (ref 80.0–100.0)
MPV: 11.3 fL (ref 7.5–12.5)
Monocytes Relative: 4.4 %
Neutro Abs: 5610 cells/uL (ref 1500–7800)
Neutrophils Relative %: 85 %
Platelets: 226 10*3/uL (ref 140–400)
RBC: 4.83 10*6/uL (ref 3.80–5.10)
RDW: 12.2 % (ref 11.0–15.0)
Total Lymphocyte: 10.3 %
WBC: 6.6 10*3/uL (ref 3.8–10.8)

## 2019-03-07 LAB — COMPLETE METABOLIC PANEL WITH GFR
AG Ratio: 1.3 (calc) (ref 1.0–2.5)
ALT: 82 U/L — ABNORMAL HIGH (ref 6–29)
AST: 48 U/L — ABNORMAL HIGH (ref 10–35)
Albumin: 3.9 g/dL (ref 3.6–5.1)
Alkaline phosphatase (APISO): 94 U/L (ref 37–153)
BUN: 21 mg/dL (ref 7–25)
CO2: 20 mmol/L (ref 20–32)
Calcium: 9.2 mg/dL (ref 8.6–10.4)
Chloride: 108 mmol/L (ref 98–110)
Creat: 0.96 mg/dL (ref 0.50–0.99)
GFR, Est African American: 75 mL/min/{1.73_m2} (ref >=60)
GFR, Est Non African American: 64 mL/min/{1.73_m2} (ref >=60)
Globulin: 3 g/dL (calc) (ref 1.9–3.7)
Glucose, Bld: 123 mg/dL — ABNORMAL HIGH (ref 65–99)
Potassium: 4.3 mmol/L (ref 3.5–5.3)
Sodium: 140 mmol/L (ref 135–146)
Total Bilirubin: 0.5 mg/dL (ref 0.2–1.2)
Total Protein: 6.9 g/dL (ref 6.1–8.1)

## 2019-03-07 LAB — INSULIN, RANDOM: Insulin: 32.1 u[IU]/mL — ABNORMAL HIGH

## 2019-03-07 LAB — LIPID PANEL
Cholesterol: 165 mg/dL (ref <200)
HDL: 54 mg/dL (ref >=50)
LDL Cholesterol (Calc): 91 mg/dL (calc)
Non-HDL Cholesterol (Calc): 111 mg/dL (calc) (ref <130)
Total CHOL/HDL Ratio: 3.1 (calc) (ref <5.0)
Triglycerides: 104 mg/dL (ref <150)

## 2019-03-07 LAB — VITAMIN D 25 HYDROXY (VIT D DEFICIENCY, FRACTURES): Vit D, 25-Hydroxy: >150 ng/mL — ABNORMAL HIGH (ref 30–100)

## 2019-03-07 LAB — HEMOGLOBIN A1C
Hgb A1c MFr Bld: 5.8 % of total Hgb — ABNORMAL HIGH (ref <5.7)
Mean Plasma Glucose: 120 (calc)
eAG (mmol/L): 6.6 (calc)

## 2019-03-07 LAB — TSH: TSH: 0.47 mIU/L (ref 0.40–4.50)

## 2019-03-07 LAB — MAGNESIUM: Magnesium: 2.2 mg/dL (ref 1.5–2.5)

## 2019-03-15 ENCOUNTER — Ambulatory Visit (INDEPENDENT_AMBULATORY_CARE_PROVIDER_SITE_OTHER): Payer: BC Managed Care – PPO | Admitting: Neurology

## 2019-03-15 ENCOUNTER — Other Ambulatory Visit: Payer: Self-pay

## 2019-03-15 ENCOUNTER — Encounter: Payer: Self-pay | Admitting: Neurology

## 2019-03-15 VITALS — BP 126/81 | HR 95 | Temp 96.9°F | Ht 69.0 in | Wt 161.0 lb

## 2019-03-15 DIAGNOSIS — IMO0002 Reserved for concepts with insufficient information to code with codable children: Secondary | ICD-10-CM

## 2019-03-15 DIAGNOSIS — G43709 Chronic migraine without aura, not intractable, without status migrainosus: Secondary | ICD-10-CM | POA: Diagnosis not present

## 2019-03-15 DIAGNOSIS — I69954 Hemiplegia and hemiparesis following unspecified cerebrovascular disease affecting left non-dominant side: Secondary | ICD-10-CM | POA: Diagnosis not present

## 2019-03-15 MED ORDER — ONABOTULINUMTOXINA 100 UNITS IJ SOLR
400.0000 [IU] | Freq: Once | INTRAMUSCULAR | Status: AC
  Administered 2019-03-15: 19:00:00 400 [IU] via INTRAMUSCULAR

## 2019-03-15 NOTE — Progress Notes (Signed)
Chief Complaint  Patient presents with   Spastic Hemiplegia/Migraines    Botox 200 units x 2 vials - office supply      PATIENT: Ruth Gregory DOB: 11-14-1960  Chief Complaint  Patient presents with   Spastic Hemiplegia/Migraines    Botox 200 units x 2 vials - office supply     HISTORICAL  Ruth Gregory is a 58 year old right-handed female  History of right internal carotid section with spastic left hemiparesis, Botox injection every 3 months  She has history of right internal carotid artery dissection following an episode of vomiting from migraine headache in mid June 2009. She was given IV TPA and found to have right MCA occlusion. She underwent emergent right carotid artery stent with distal endovascular recanalization of the middle cerebral artery.  She had small hemorrhagic transformation of the right basal ganglia with mild left hemiparesis, but has done well since then and has been independent.  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  She has been receiving BOTOX injection since 01/2009 every 3 months, to her left upper extremity and also left lower extremity, responded well.  Repeat US carotid were normal in October 2013  Chronic migraine headaches: She had long-standing history of chronic migraine, average 2-4 headaches each months, she has increased frequency headaches around fall, and spring, she is now taking Topamax ER 150 mg every night as preventive  medication, magnesium oxide 400 mg, riboflavin 100 mg twice a day,  She is not candidate for triptan because previous history of stroke, Cambia works well for her, she her insurance only allow her to have 9 tablets each months. Occasionally she take Fioricet, hydrocodone as needed, even prednisone 20 mg as needed as rescue therapy  Over the past few weeks, she been having to 3 headaches each week, used out her cambia, she also complains of neck muscle tightness, receiving chiropractor sometimes   Last Botox injection to spastic left upper extremity works well, but she noticed left hand muscle weakness.  UPDATE Mar 26 2015: She responded very well to previous Botox injection in September 2016, but noticed increased left hand weakness, gradually recovered over the past few weeks  Update July 15 2015: She responded very well to previous Botox injection in December 20 eighth 2016, noticed returning of left finger flexion spasticity, left ankle plantarflexion, want today's injection emphasize on above abnormal posturing.  She still has frequent migraine headaches, 1-2 headache each week, some headache was protracted, lasting 1-2 days, require multiple dose of cambia and Fioricet  Update November 20 2015: Patient called in September 09 2015 "She also said the left hand is very weak- 12,3 digits > 4,5. She said last OV she rec'd botox in wrist. She sts she may not be able to get botox next month if she is not feeling better"  She has a lot of pain on her left leg, mainly her left ankle from knee down, throbbing, tooth ache, she noticed more tendency of left ankle plantar flexion.  She  continue have intermittent migraine headaches, more at the left retro-orbital  Area  UPDATE Dec 6th 2017: She responded very well to previous Botox injection, complains frequent migraine headaches, once a week, lasting 1-2 days, partial response to combination of cambia, Fioricet, Compazine, she is taking Topamax ER 150 mg  as preventive medications  UPDATE June 30 2016: She had her first Botox injection for migraine prevention in December 2017, with only used 75 units she reported dramatic improvement, she only has migraines about once a week, which is milder, better controlled by the medication, instead of 2-3 times more severe headaches, at the end of the benefit, she noticed recurrent of her headaches, she also noticed mild left shoulder stiffness, left ankle turned in,  She noticed worsening right knee pain, recently had right knee injection.  Update November 04 2016: She responded well to previous injection, Botox injection to bilateral frontal temporal region has helped her headache,  UPDATE Mar 11 2017: She responded well to previous injection in August 2018.  Reported increased migraine headache over the past few weeks,  UPDATE Aug 08 2017: She is having migraine almost every week, lasting for 2 days, she is hesitate   UPDATE Sept 30 2019: She missed her appointment in August, she was given toradol and pheneran by pcp recently for protracted migraine headache, which did help her headache, but less effective than Depacon  She continue have 1 or 2 migraine headaches 1 to 2 weeks, also noticed increased left ankle spasticity, unsteady gait  UPDATE Mar 23 2018: She still has frequent headaches, want to explore the possibility of CGRP, responded very well to previous injection,  UPDATE Aug 23 2018: She responded very well to previous injection, only has occasional migraine headaches  UPDATE Sept 2 2020: She responded well to previous injection, had occasionally migraine headaches, Cambia works well for her, left hand finger spasm has much improved, she continue has mild left ankle plantarflexion,  Update March 15, 2019: She responded well to previous injection, increased migraine due to her Covid infection recently  Review of system: Pertinent as above   ALLERGIES: Allergies  Allergen Reactions     Penicillins Anaphylaxis and Hives   Imitrex [Sumatriptan]    Other Swelling    kiwi   Sulfa Antibiotics Hives   Zomig [Zolmitriptan]     HOME MEDICATIONS: Current Outpatient Medications  Medication Sig Dispense Refill   albuterol (PROVENTIL HFA) 108 (90 Base) MCG/ACT inhaler 1 to 2 inhalations 10-15 minutes apart every 4 hours if needed for asthma rescue 48 g 3   ALPRAZolam (XANAX) 1 MG tablet TAKE 1/2 TABLET TO 1 TABLET TWO TO THREE TIMES A DAY AS NEEDED FOR ANXIETY. LIMIT TO 5 DAYS A WEEK TO AVOID ADDICTION. 30 tablet 0   aspirin EC 81 MG tablet Take 81 mg by mouth daily.     Botulinum Toxin Type A (BOTOX) 200 UNITS SOLR Inject 400 Units as directed every 3 (three) months. Change to BOTOX A 100  units     BREO ELLIPTA 100-25 MCG/INH AEPB 1 INHALATION DAILY TO PREVENT ASTHMA 180 each 3   buPROPion (WELLBUTRIN XL) 300 MG 24 hr tablet TAKE 1 TABLET (300 MG TOTAL) BY MOUTH DAILY. WITH FOOD 90 tablet 3   butalbital-acetaminophen-caffeine (FIORICET) 50-325-40 MG tablet TAKE 1 TABLET BY MOUTH DAILY AS NEEDED FOR HEADACHE. DO NOT REFILL IN LESS THAN 30 DAYS, 12 tablet 5   cetirizine (ZYRTEC) 10 MG tablet Take 10 mg by mouth daily.  Diclofenac Potassium (CAMBIA) 50 MG PACK TAKE 1 PACKET AT ONSET OF MIGRAINE. CAN REPEAT IN 2 HR ONCE IF NEEDED. NO MORE THAN 2 DOSES IN 24HR. 8 each 11   Estradiol (YUVAFEM) 10 MCG TABS vaginal tablet Place 10 mcg vaginally 2 (two) times a week.     fenofibrate micronized (LOFIBRA) 134 MG capsule TAKE 1 CAPSULE EVERY DAY 90 capsule 3   levothyroxine (SYNTHROID) 88 MCG tablet TAKE 1 TABLET BY MOUTH EVERY DAY 90 tablet 1   magnesium oxide (MAG-OX) 400 MG tablet Take 400 mg by mouth 2 (two) times daily.      montelukast (SINGULAIR) 10 MG tablet Take 1 tablet at bedtime for allergies. 90 tablet 1   Multiple Vitamin (MULTIVITAMIN WITH MINERALS) TABS tablet Take 1 tablet by mouth daily.     Multiple Vitamins-Minerals (ZINC PO) Take 1 tablet by  mouth daily.     nortriptyline (PAMELOR) 25 MG capsule Take 25 mg by mouth at bedtime.     omeprazole (PRILOSEC) 40 MG capsule TAKE 1 CAPSULE DAILY FOR ACID INDIGESTION & REFLUX 90 capsule 3   prochlorperazine (COMPAZINE) 5 MG tablet TAKE 1 TABLET BY MOUTH 3 TIMES A DAY AS NEEDED FOR NAUSEA 50 tablet 1   Riboflavin 100 MG TABS Take 100 mg by mouth. bid     rosuvastatin (CRESTOR) 40 MG tablet Take 1 tablet (40 mg total) by mouth daily. 90 tablet 1   topiramate (TOPAMAX) 100 MG tablet Take 1.5 tablets (150 mg total) by mouth daily. (Patient taking differently: Take 150 mg by mouth daily. Takes 2 tablets twice daily) 135 tablet 3   verapamil (CALAN-SR) 240 MG CR tablet Take 1 tablet Daily with Food for BP & Migraine Prevention 90 tablet 3   vitamin B-12 (CYANOCOBALAMIN) 1000 MCG tablet Take 1,000 mcg by mouth daily.     Vitamin D, Ergocalciferol, (DRISDOL) 1.25 MG (50000 UT) CAPS capsule Take 1 capsule daily for Vitamin D deficiency. 90 capsule 1   amphetamine-dextroamphetamine (ADDERALL) 10 MG tablet Take 1/2 to 1 tablet 1 or 2 x daily for ADD 60 tablet 0   No current facility-administered medications for this visit.    PAST MEDICAL HISTORY: Past Medical History:  Diagnosis Date   Anemia    Anxiety    Asthma    Chronic heartburn    Depression    Elevated cholesterol    on Crestor   Headache(784.0)    Hypertension    Hypothyroidism    S/P thyroidectomy   Migraine    Occlusion and stenosis of carotid artery with cerebral infarction    Spasm of muscle    Unspecified cerebral artery occlusion with cerebral infarction    Vitamin D deficiency     PAST SURGICAL HISTORY: Past Surgical History:  Procedure Laterality Date   AUGMENTATION MAMMAPLASTY Bilateral 2000   COSMETIC SURGERY     CYSTOCELE REPAIR N/A 01/08/2013   Procedure: ANTERIOR REPAIR (CYSTOCELE);  Surgeon: Margarette Asal, MD;  Location: [facility];  Service: Gynecology;  Laterality: N/A;   NASAL  SINUS SURGERY     VAGINAL HYSTERECTOMY      FAMILY HISTORY: Family History  Problem Relation Age of Onset   Heart disease Mother    Peptic Ulcer Disease Mother    Heart disease Father    Parkinson's disease Father     SOCIAL HISTORY:  Social History   Socioeconomic History   Marital status: Married    Spouse name: Ruth Gregory   Number of children: 2  Years of education: college   Highest education level: Not on file  Occupational History   Occupation: DESIGNER    Employer: SEL[employer]  Tobacco Use   Smoking status: Never Smoker   Smokeless tobacco: Never Used  Substance and Sexual Activity   Alcohol use: Yes    Comment: occassionally drinks a glass of wine   Drug use: No   Sexual activity: Not on file  Other Topics Concern   Not on file  Social History Narrative   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 Patient is married and lives with her husband. Patient has college education.   Right handed.   Caffeine- two cups daily.   Social Determinants of Health   Financial Resource Strain:    Difficulty of Paying Living Expenses: Not on file  Food Insecurity:    Worried About Programme researcher, broadcasting/film/video in the Last Year: Not on file   [employer] of Food in the Last Year: Not on file  Transportation Needs:    Lack of Transportation (Medical): Not on file   Lack of Transportation (Non-Medical): Not on file  Physical Activity:    Days of Exercise per Week: Not on file   Minutes of Exercise per Session: Not on file  Stress:    Feeling of Stress : Not on file  Social Connections:    Frequency of Communication with Friends and Family: Not on file   Frequency of Social Gatherings with Friends and Family: Not on file   Attends Religious Services: Not on file   Active Member of Clubs or Organizations: Not on file   Attends Banker Meetings: Not on file   Marital Status: Not on file  Intimate Partner Violence:    Fear of Current or  Ex-Partner: Not on file   Emotionally Abused: Not on file   Physically Abused: Not on file   Sexually Abused: Not on file     PHYSICAL EXAM   Vitals:   03/15/19 0902  BP: 126/81  Pulse: 95  Temp: (!) 96.9 F (36.1 C)  Weight: 161 lb (73 kg)  Height:  (1.753 m)    Not recorded      Body mass index is 23.78 kg/m.      NEUROLOGICAL EXAM:   MOTOR: Mild spastic left hemiparesis, left upper extremity proximal and distal 4/5, mild left hip flexion and left ankle dorsiflexion weakness   GAIT/STANCE: Mildly unsteady gait, checking her left foot across the floor  Romberg is absent.   DIAGNOSTIC DATA (LABS, IMAGING, TESTING) - I reviewed patient records, labs, notes, testing and imaging myself where available.   ASSESSMENT AND PLAN  Maigan Bittinger is a 58 y.o. female   Chronic migraine headaches  Continue preventive medications, nortriptyline 25 mg every night, topiramate 200 mg daily  Cambia, Fioricet, Compazine as needed,+ tizanidine prn  Continue magnesium oxide, riboflavin as preventive medications,   Spastic left hemiparesis following right internal carotid artery dissection, right MCA stroke  EMG guided Botox injection, used 400 units of Botox A total   Left pronator teres 25 units Left flexor digitorum profoundi 25 units Left flexor digitorum superficialis 25 units Left palmaris longus 25 units  Left pectoralis major 50 units Left latissimus dorsi 50 units  Left tibialis posterior 50 units Left flexor digitorum longus 50 units  I also injected  100 units for chronic migraine headache  Frontalis 20 Temporalis 40 units Occipital 25 units Upper cervical 15 units  Ruth Gregory, M.D. Ph.D.  [facility] [address] [place], [place] [address] Ph: [phone] Fax: [phone]  CC: Referring Provider

## 2019-03-15 NOTE — Progress Notes (Signed)
**  Botox 200 units x 2 vials, NDC 5501-5868-25, Lot [id number], Exp 11/2021, office supply.//mck,rn**

## 2019-03-28 ENCOUNTER — Other Ambulatory Visit: Payer: Self-pay

## 2019-03-28 DIAGNOSIS — F419 Anxiety disorder, unspecified: Secondary | ICD-10-CM

## 2019-03-28 DIAGNOSIS — F988 Other specified behavioral and emotional disorders with onset usually occurring in childhood and adolescence: Secondary | ICD-10-CM

## 2019-03-28 MED ORDER — AMPHETAMINE-DEXTROAMPHETAMINE 10 MG PO TABS: ORAL_TABLET | ORAL | 0 refills | Status: DC

## 2019-03-28 MED ORDER — ALPRAZOLAM 1 MG PO TABS: ORAL_TABLET | ORAL | 0 refills | Status: DC

## 2019-04-01 ENCOUNTER — Other Ambulatory Visit: Payer: Self-pay | Admitting: Adult Health

## 2019-04-01 ENCOUNTER — Other Ambulatory Visit: Payer: Self-pay | Admitting: Internal Medicine

## 2019-04-01 MED ORDER — MONTELUKAST SODIUM 10 MG PO TABS: ORAL_TABLET | ORAL | 3 refills | Status: DC

## 2019-04-03 ENCOUNTER — Other Ambulatory Visit: Payer: Self-pay | Admitting: Neurology

## 2019-05-21 ENCOUNTER — Other Ambulatory Visit: Payer: Self-pay

## 2019-05-21 DIAGNOSIS — F988 Other specified behavioral and emotional disorders with onset usually occurring in childhood and adolescence: Secondary | ICD-10-CM

## 2019-05-21 MED ORDER — AMPHETAMINE-DEXTROAMPHETAMINE 10 MG PO TABS: ORAL_TABLET | ORAL | 0 refills | Status: DC

## 2019-05-26 ENCOUNTER — Other Ambulatory Visit: Payer: Self-pay | Admitting: Internal Medicine

## 2019-05-26 MED ORDER — ONDANSETRON 8 MG PO TBDP: ORAL_TABLET | ORAL | 0 refills | Status: DC

## 2019-05-26 MED ORDER — PREDNISONE 20 MG PO TABS: ORAL_TABLET | ORAL | 0 refills | Status: DC

## 2019-06-04 NOTE — Progress Notes (Signed)
3 Month Follow Up   Assessment and Plan:   Chronic migraine Managing well at this time, managed with Neurology and receiving Botox injections Q35month. -Topamax ER 150 mg and nortriptyline 560mevery night as peventive medication, magnesium oxide 400 mg, riboflavin 100 mg twice a day.  -For acute flares she utilizes Cambia PRN.  She also has Fioricet PRN, which she uses occassionally and Prednisone 2047mor rescue therapy.  Gastroesophageal reflux disease, esophagitis presence not specified Doing well at this time, continue Prilosec 60m2mthout breakthrough Failed H2i taper x 2 -     Magnesium  Spastic hemiplegia of left nondominant side as late effect of cerebrovascular disease, unspecified cerebrovascular disease type (HCC)Simmesporting well at this time with movement and function Continue Botox injections, follow with Neurology, Dr Yan Krista Blue this. -     COMPLETE METABOLIC PANEL WITH GFR  Anxiety Doing well at this time Continue Alprazolam with benefit, rare use Stress management techniques discussed, increase water, good sleep hygiene discussed, increase exercise, and increase veggies.   Mixed hyperlipidemia Continue medications: rosuvastatin, fenofibrate  Continue low cholesterol diet and exercise.  Check lipid panel.  -     Lipid panel  Hypothyroidism, unspecified type -     TSH -     levothyroxine (SYNTHROID) 88 MCG tablet; Take 1 tablet (88 mcg total) by mouth daily. Reminder to take on an empty stomach 30-60mi49mefore first meal of the day.  ADD (attention deficit disorder) without hyperactivity Doing well on current regiment Continue with benefit -Adderall 10mg 49mto 1 tablet BID  Vitamin D deficiency Has reduced dose, was above goal last visit Hasn't met deductible; defer to next OV  Other abnormal glucose Recent A1Cs at goal Discussed diet/exercise, weight management  Defer A1C; check CMP  Labile hypertension Controlled today, will continue to  monitor  Medication management -     Magnesium -     CBC with Differential/Platelet -     COMPLETE METABOLIC PANEL WITH GFR  BMI 25.0-25.9 Discussed dietary and exercise modifications Continue to recommend diet heavy in fruits and veggies and low in animal meats, cheeses, and dairy products, appropriate calorie intake Discuss exercise recommendations routinely Continue to monitor weight at each visit  Continue diet and meds as discussed. Further disposition pending results of labs. Discussed med's effects and SE's.   Over 30 minutes of exam, counseling, chart review, and critical decision making was performed.   Future Appointments  Date Time Provider DepartEscudilla [email or web address] 11:15 AM McClanGarnet SierrasAAM-GAAIM None  06/11/2019  2:15 PM COLISEDanaEC P[facility]  06/18/2019  1:30 PM Yan, YMarcial PacasNA-GNA None  09/06/2019 11:00 AM McKeowUnk PintoAAM-GAAIM None    ----------------------------------------------------------------------------------------------------------------------  HPI 58 [id number]  presents for 3 month follow up on elevated blood pressures w/o diagnosis of HTN, HLD, Migraine, Hypothyroidism,  Pre-Diabetes and Vitamin D Deficiency.     In 2009, she had a Right MCA CVA attributed to a Right Carotid artery dissection from fibromuscular dysplasia and has residual of a mild spastic Left HP and mild cognitive dysfunction difficulty focusing/concentrating. These latter sx's are improved with routine Adderall. Patient also follow with Dr Yan, NKrista Blueologist, for Bo-Tox injections for her spasticity and migraines.    Patient is on an ADD medication, she states that the medication is helping and she denies any adverse reactions. Takes adderall 10 mg once daily Mon-Fri  she has a diagnosis of anxiety and is currently on wellbutrin 300 mg  and PRN xanax, reports symptoms are well controlled on current regimen. she reports currently takes xanax rarely,  takes 1/2 tab just a few days per month.   Reports that she has had to take [place] more than usual, she took three in two days related to bad migraine.  She reports she is having approxamatley 1-2 a week.   Weather is a trigger, situational stress. She is drinking about  Next OV with Dr Krista Blue, 3/22.  Need most recent labs faxed Dr Posey Pronto, Kidney Specialist  She has a diagnosis of GERD which is currently managed by omeprazole 40 mg daily, didn't do well with attempt to taper with H2.  She reports she has had and increase in hair loss/shedding.  She feels like her hair is thinner than it used to be.  She denies any bald spots or hair coming out in large chunks. She reports symptoms is currently well controlled, and denies breakthrough reflux, burning in chest, hoarseness or cough.    BMI is There is no height or weight on file to calculate BMI., she has been working on diet and exercise. Wt Readings from Last 3 Encounters:  03/15/19 161 lb (73 kg)  03/06/19 158 lb 9.6 oz (71.9 kg)  12/05/18 166 lb (75.3 kg)   Her blood pressure has been controlled at home, today their BP is    She does not workout. She denies any cardiac symptoms, chest pains, palpitations, shortness of breath, dizziness or lower extremity edema.     She is on cholesterol medication Rosuvastatin 40 mg every other day, fenofibrate 134 mg daily and denies myalgias. Her cholesterol is at goal. The cholesterol last visit was:   Lab Results  Component Value Date   CHOL 165 03/06/2019   HDL 54 03/06/2019   LDLCALC 91 03/06/2019   TRIG 104 03/06/2019   CHOLHDL 3.1 03/06/2019    She has not been working on diet and exercise for prediabetes ( 5.8%, 2008), and denies hyperglycemia, hypoglycemia , nausea, polydipsia and polyuria. Last A1C in the office was:  Lab Results  Component Value Date   HGBA1C 5.8 (H) 03/06/2019   Patient has been on Thyroid Supression since Thyroid surgeryfor benign nodule and goiterin 2007.  She is  currently taking levothyroxine 88 mcg daily.   Her medication was not changed last visit.   Lab Results  Component Value Date   TSH 0.47 03/06/2019   Patient is on Vitamin D supplement, has cut back from 50,000 daily to 3 days a week Lab Results  Component Value Date   VD25OH >150 (H) 03/06/2019        Current Medications:  Current Outpatient Medications on File Prior to Visit  Medication Sig  . albuterol (PROVENTIL HFA) 108 (90 Base) MCG/ACT inhaler 1 to 2 inhalations 10-15 minutes apart every 4 hours if needed for asthma rescue  . ALPRAZolam (XANAX) 1 MG tablet Take 1/2-1 tablet at Bedtime ONLY if needed for Sleep  &  limit to 5 days /week to avoid addiction  . amphetamine-dextroamphetamine (ADDERALL) 10 MG tablet Take 1/2 to 1 tablet 1 or 2 x daily for ADD  . aspirin EC 81 MG tablet Take 81 mg by mouth daily.  . Botulinum Toxin Type A (BOTOX) 200 UNITS SOLR Inject 400 Units as directed every 3 (three) months. Change to BOTOX A 100  units  . BREO ELLIPTA 100-25 MCG/INH AEPB 1 INHALATION DAILY TO PREVENT ASTHMA  . buPROPion (WELLBUTRIN XL) 300 MG 24 hr tablet TAKE  1 TABLET (300 MG TOTAL) BY MOUTH DAILY. WITH FOOD  . butalbital-acetaminophen-caffeine (FIORICET) 50-325-40 MG tablet TAKE 1 TABLET BY MOUTH DAILY AS NEEDED FOR HEADACHE. DO NOT REFILL IN LESS THAN 30 DAYS,  . cetirizine (ZYRTEC) 10 MG tablet Take 10 mg by mouth daily.  . Diclofenac Potassium,Migraine, (CAMBIA) 50 MG PACK MIX 1 PACK WITH 1-2 OUNCES OF WATER AND DRINK AT ONSET OF MIGRAINE CAN REPEAT IN 2 HOURS IF NEEDED MAX 2/24 HOURS  . Estradiol (YUVAFEM) 10 MCG TABS vaginal tablet Place 10 mcg vaginally 2 (two) times a week.  . fenofibrate micronized (LOFIBRA) 134 MG capsule TAKE 1 CAPSULE EVERY DAY  . levothyroxine (SYNTHROID) 88 MCG tablet TAKE 1 TABLET BY MOUTH EVERY DAY  . magnesium oxide (MAG-OX) 400 MG tablet Take 400 mg by mouth 2 (two) times daily.   . montelukast (SINGULAIR) 10 MG tablet Take 1 tablet Daily  for  Allergies.  . Multiple Vitamin (MULTIVITAMIN WITH MINERALS) TABS tablet Take 1 tablet by mouth daily.  . Multiple Vitamins-Minerals (ZINC PO) Take 1 tablet by mouth daily.  . nortriptyline (PAMELOR) 25 MG capsule Take 25 mg by mouth at bedtime.  Marland Kitchen omeprazole (PRILOSEC) 40 MG capsule TAKE 1 CAPSULE DAILY FOR ACID INDIGESTION & REFLUX  . ondansetron (ZOFRAN ODT) 8 MG disintegrating tablet Dissolve 1 tablet under tongue every 6 to 8 hours for nausea  or vomitting  . predniSONE (DELTASONE) 20 MG tablet 1 TAB 3 TIMES A DAY FOR 3 DAYS, THEN 1 TAB 2 TIMES A DAY FOR 3 DAYS, THEN 1 TAB 1 TIME A DAY FOR 5 DAYS  . predniSONE (DELTASONE) 20 MG tablet 1 tab 3 x day for 3 days, then 1 tab 2 x day for 3 days, then 1 tab 1 x day for 5 days  . prochlorperazine (COMPAZINE) 5 MG tablet TAKE 1 TABLET BY MOUTH 3 TIMES A DAY AS NEEDED FOR NAUSEA  . Riboflavin 100 MG TABS Take 100 mg by mouth. bid  . rosuvastatin (CRESTOR) 40 MG tablet Take 1 tablet (40 mg total) by mouth daily. Take 1 tablet Daily for Cholesterol  . topiramate (TOPAMAX) 100 MG tablet Take 1.5 tablets (150 mg total) by mouth daily. (Patient taking differently: Take 150 mg by mouth daily. Takes 2 tablets twice daily)  . verapamil (CALAN-SR) 240 MG CR tablet Take 1 tablet Daily with Food for BP & Migraine Prevention  . vitamin B-12 (CYANOCOBALAMIN) 1000 MCG tablet Take 1,000 mcg by mouth daily.  . Vitamin D, Ergocalciferol, (DRISDOL) 1.25 MG (50000 UT) CAPS capsule Take 1 capsule daily for Vitamin D deficiency.   No current facility-administered medications on file prior to visit.    Allergies:  Allergies  Allergen Reactions  . Penicillins Anaphylaxis and Hives  . Imitrex [Sumatriptan]   . Other Swelling    kiwi  . Sulfa Antibiotics Hives  . Zomig [Zolmitriptan]      Medical History:  Past Medical History:  Diagnosis Date  . Anemia   . Anxiety   . Asthma   . Chronic heartburn   . Depression   . Elevated cholesterol    on Crestor  .  Headache(784.0)   . Hypertension   . Hypothyroidism    S/P thyroidectomy  . Migraine   . Occlusion and stenosis of carotid artery with cerebral infarction   . Spasm of muscle   . Unspecified cerebral artery occlusion with cerebral infarction   . Vitamin D deficiency     Family history- Reviewed and unchanged  Social history- Reviewed and unchanged  Screening Tests: Immunization History  Administered Date(s) Administered  . Influenza Inj Mdck Quad With Preservative 01/20/2017, 01/09/2018, 01/10/2019  . Influenza Split 01/24/2014  . Influenza, Seasonal, Injecte, Preservative Fre 02/09/2016  . Influenza-Unspecified 11/28/2011  . PPD Test 04/04/2013, 04/22/2015, 05/18/2016, 06/20/2017, 07/27/2018  . Pneumococcal Polysaccharide-23 03/29/1998  . Td 03/29/2002  . Tdap 05/18/2016     Review of Systems:  Review of Systems  Constitutional: Negative for chills, diaphoresis, fever, malaise/fatigue and weight loss.  HENT: Negative for congestion, ear discharge, ear pain, hearing loss, nosebleeds, sinus pain, sore throat and tinnitus.   Eyes: Negative for blurred vision, double vision, photophobia, pain, discharge and redness.  Respiratory: Negative for cough, hemoptysis, sputum production, shortness of breath, wheezing and stridor.   Cardiovascular: Negative for chest pain, palpitations, orthopnea, claudication, leg swelling and PND.  Gastrointestinal: Negative for abdominal pain, blood in stool, constipation, diarrhea, heartburn, melena, nausea and vomiting.  Genitourinary: Negative for dysuria, flank pain, frequency, hematuria and urgency.  Musculoskeletal: Negative for back pain, falls, joint pain, myalgias and neck pain.  Skin: Negative for itching and rash.  Neurological: Negative for dizziness, tingling, tremors, sensory change, speech change, focal weakness, seizures, loss of consciousness, weakness and headaches.  Endo/Heme/Allergies: Negative for environmental allergies and  polydipsia. Does not bruise/bleed easily.  Psychiatric/Behavioral: Negative for depression, hallucinations, memory loss, substance abuse and suicidal ideas. The patient is not nervous/anxious and does not have insomnia.      Physical Exam: There were no vitals taken for this visit. Wt Readings from Last 3 Encounters:  03/15/19 161 lb (73 kg)  03/06/19 158 lb 9.6 oz (71.9 kg)  12/05/18 166 lb (75.3 kg)   General Appearance: Well nourished, in no apparent distress. Eyes: PERRLA, EOMs, conjunctiva no swelling or erythema Sinuses: No Frontal/maxillary tenderness ENT/Mouth: Ext aud canals clear, TMs without erythema, bulging. No erythema, swelling, or exudate on post pharynx.  Tonsils not swollen or erythematous. Hearing normal.  Neck: Supple, thyroid normal.  Respiratory: Respiratory effort normal, BS equal bilaterally without rales, rhonchi, wheezing or stridor.  Cardio: RRR with no MRGs. Brisk peripheral pulses without edema.  Abdomen: Soft, + BS.  Non tender, no guarding, rebound, hernias, masses. Lymphatics: Non tender without lymphadenopathy.  Musculoskeletal: Full ROM, 5/5 strength right, 4/5 strength on left. normal gait, slow. Skin: Warm, dry without rashes, lesions, ecchymosis.  Neuro: Cranial nerves intact. No cerebellar symptoms.  Psych: Awake and oriented X 3, normal affect, Insight and Judgment appropriate.    Garnet Sierras, NP 12:37 PM [facility]

## 2019-06-05 ENCOUNTER — Ambulatory Visit (INDEPENDENT_AMBULATORY_CARE_PROVIDER_SITE_OTHER): Payer: BC Managed Care – PPO | Admitting: Adult Health Nurse Practitioner

## 2019-06-05 ENCOUNTER — Other Ambulatory Visit: Payer: Self-pay

## 2019-06-05 ENCOUNTER — Encounter: Payer: Self-pay | Admitting: Adult Health Nurse Practitioner

## 2019-06-05 VITALS — BP 120/74 | HR 100 | Temp 97.7°F | Wt 161.0 lb

## 2019-06-05 DIAGNOSIS — E559 Vitamin D deficiency, unspecified: Secondary | ICD-10-CM

## 2019-06-05 DIAGNOSIS — Z8673 Personal history of transient ischemic attack (TIA), and cerebral infarction without residual deficits: Secondary | ICD-10-CM

## 2019-06-05 DIAGNOSIS — I10 Essential (primary) hypertension: Secondary | ICD-10-CM

## 2019-06-05 DIAGNOSIS — N1831 Chronic kidney disease, stage 3a: Secondary | ICD-10-CM

## 2019-06-05 DIAGNOSIS — F988 Other specified behavioral and emotional disorders with onset usually occurring in childhood and adolescence: Secondary | ICD-10-CM

## 2019-06-05 DIAGNOSIS — Z79899 Other long term (current) drug therapy: Secondary | ICD-10-CM | POA: Diagnosis not present

## 2019-06-05 DIAGNOSIS — E890 Postprocedural hypothyroidism: Secondary | ICD-10-CM | POA: Diagnosis not present

## 2019-06-05 DIAGNOSIS — G43011 Migraine without aura, intractable, with status migrainosus: Secondary | ICD-10-CM

## 2019-06-05 DIAGNOSIS — E782 Mixed hyperlipidemia: Secondary | ICD-10-CM

## 2019-06-05 DIAGNOSIS — K219 Gastro-esophageal reflux disease without esophagitis: Secondary | ICD-10-CM

## 2019-06-05 DIAGNOSIS — R7309 Other abnormal glucose: Secondary | ICD-10-CM

## 2019-06-05 DIAGNOSIS — I69954 Hemiplegia and hemiparesis following unspecified cerebrovascular disease affecting left non-dominant side: Secondary | ICD-10-CM

## 2019-06-05 NOTE — Patient Instructions (Signed)
We will contact you in 1-3 days with your lab results.  We will fax the results to Dr Posey Pronto.  Try some Selsum Blue for your scalp.  This will help with itchy dry scalp.      Vit D  & Vit C 1,000 mg   are recommended to help protect  against the Covid-19 and other Corona viruses.    Also it's recommended  to take  Zinc 50 mg  to help  protect against the Covid-19   and best place to get  is also on [email or web address]  and don't pay more than 6-8 cents /pill !  ================================ Coronavirus (COVID-19) Are you at risk?  Are you at risk for the Coronavirus (COVID-19)?  To be considered HIGH RISK for Coronavirus (COVID-19), you have to meet the following criteria:  . Traveled to [place], [place], [place], [place] or [place]; or in the [place] to [place], [place], [place]  . or [place]; and have fever, cough, and shortness of breath within the last 2 weeks of travel OR . Been in close contact with a person diagnosed with COVID-19 within the last 2 weeks and have  . fever, cough,and shortness of breath .  . IF YOU DO NOT MEET THESE CRITERIA, YOU ARE CONSIDERED LOW RISK FOR COVID-19.  What to do if you are HIGH RISK for COVID-19?  Marland Kitchen If you are having a medical emergency, call 911. . Seek medical care right away. Before you go to a doctor's office, urgent care or emergency department, .  call ahead and tell them about your recent travel, contact with someone diagnosed with COVID-19  .  and your symptoms.  . You should receive instructions from your physician's office regarding next steps of care.  . When you arrive at healthcare provider, tell the healthcare staff immediately you have returned from  . visiting [place], [place], [place], [place] or [place]; or traveled in the [place] to [place], [place],  . [place] or [place] in the last two weeks or you have been in close contact with a person diagnosed with  . COVID-19 in the last 2 weeks.    . Tell the health care staff about your symptoms: fever, cough and shortness of breath. . After you have been seen by a medical provider, you will be either: o Tested for (COVID-19) and discharged home on quarantine except to seek medical care if  o symptoms worsen, and asked to  - Stay home and avoid contact with others until you get your results (4-5 days)  - Avoid travel on public transportation if possible (such as bus, train, or airplane) or o Sent to the Emergency Department by EMS for evaluation, COVID-19 testing  and  o possible admission depending on your condition and test results.  What to do if you are LOW RISK for COVID-19?  Reduce your risk of any infection by using the same precautions used for avoiding the common cold or flu:  Marland Kitchen Wash your hands often with soap and warm water for at least 20 seconds.  If soap and water are not readily available,  . use an alcohol-based hand sanitizer with at least 60% alcohol.  . If coughing or sneezing, cover your mouth and nose by coughing or sneezing into the elbow areas of your shirt or coat, .  into a tissue or into your sleeve (not your hands). . Avoid shaking hands with others and consider head nods or verbal  greetings only. . Avoid touching your eyes, nose, or mouth with unwashed hands.  . Avoid close contact with people who are sick. . Avoid places or events with large numbers of people in one location, like concerts or sporting events. . Carefully consider travel plans you have or are making. . If you are planning any travel outside or inside the [place], visit the CDC's Travelers' Health webpage for the latest health notices. . If you have some symptoms but not all symptoms, continue to monitor at home and seek medical attention  . if your symptoms worsen. . If you are having a medical emergency, call 911. >>>>>>>>>>>>>>>>>>>>>>> Preventive Care for Adults  A healthy lifestyle and preventive care can promote health and wellness.  Preventive health guidelines for women include the following key practices.  A routine yearly physical is a good way to check with your health care provider about your health and preventive screening. It is a chance to share any concerns and updates on your health and to receive a thorough exam.  Visit your dentist for a routine exam and preventive care every 6 months. Brush your teeth twice a day and floss once a day. Good oral hygiene prevents tooth decay and gum disease.  The frequency of eye exams is based on your age, health, family medical history, use of contact lenses, and other factors. Follow your health care provider's recommendations for frequency of eye exams.  Eat a healthy diet. Foods like vegetables, fruits, whole grains, low-fat dairy products, and lean protein foods contain the nutrients you need without too many calories. Decrease your intake of foods high in solid fats, added sugars, and salt. Eat the right amount of calories for you. Get information about a proper diet from your health care provider, if necessary.  Regular physical exercise is one of the most important things you can do for your health. Most adults should get at least 150 minutes of moderate-intensity exercise (any activity that increases your heart rate and causes you to sweat) each week. In addition, most adults need muscle-strengthening exercises on 2 or more days a week.  Maintain a healthy weight. The body mass index (BMI) is a screening tool to identify possible weight problems. It provides an estimate of body fat based on height and weight. Your health care provider can find your BMI and can help you achieve or maintain a healthy weight. For adults 20 years and older:  A BMI below 18.5 is considered underweight.  A BMI of 18.5 to 24.9 is normal.  A BMI of 25 to 29.9 is considered overweight.  A BMI of 30 and above is considered obese.  Maintain normal blood lipids and cholesterol levels by exercising  and minimizing your intake of saturated fat. Eat a balanced diet with plenty of fruit and vegetables. Blood tests for lipids and cholesterol should begin at age 13 and be repeated every 5 years. If your lipid or cholesterol levels are high, you are over 50, or you are at high risk for heart disease, you may need your cholesterol levels checked more frequently. Ongoing high lipid and cholesterol levels should be treated with medicines if diet and exercise are not working.  If you smoke, find out from your health care provider how to quit. If you do not use tobacco, do not start.  Lung cancer screening is recommended for adults aged 86-80 years who are at high risk for developing lung cancer because of a history of smoking. A yearly low-dose CT  scan of the lungs is recommended for people who have at least a 30-pack-year history of smoking and are a current smoker or have quit within the past 15 years. A pack year of smoking is smoking an average of 1 pack of cigarettes a day for 1 year (for example: 1 pack a day for 30 years or 2 packs a day for 15 years). Yearly screening should continue until the smoker has stopped smoking for at least 15 years. Yearly screening should be stopped for people who develop a health problem that would prevent them from having lung cancer treatment.  High blood pressure causes heart disease and increases the risk of stroke. Your blood pressure should be checked at least every 1 to 2 years. Ongoing high blood pressure should be treated with medicines if weight loss and exercise do not work.  If you are 44-44 years old, ask your health care provider if you should take aspirin to prevent strokes.  Diabetes screening involves taking a blood sample to check your fasting blood sugar level. This should be done once every 3 years, after age 25, if you are within normal weight and without risk factors for diabetes. Testing should be considered at a younger age or be carried out more  frequently if you are overweight and have at least 1 risk factor for diabetes.  Breast cancer screening is essential preventive care for women. You should practice "breast self-awareness." This means understanding the normal appearance and feel of your breasts and may include breast self-examination. Any changes detected, no matter how small, should be reported to a health care provider. Women in their 34s and 30s should have a clinical breast exam (CBE) by a health care provider as part of a regular health exam every 1 to 3 years. After age 42, women should have a CBE every year. Starting at age 16, women should consider having a mammogram (breast X-ray test) every year. Women who have a family history of breast cancer should talk to their health care provider about genetic screening. Women at a high risk of breast cancer should talk to their health care providers about having an MRI and a mammogram every year.  Breast cancer gene (BRCA)-related cancer risk assessment is recommended for women who have family members with BRCA-related cancers. BRCA-related cancers include breast, ovarian, tubal, and peritoneal cancers. Having family members with these cancers may be associated with an increased risk for harmful changes (mutations) in the breast cancer genes BRCA1 and BRCA2. Results of the assessment will determine the need for genetic counseling and BRCA1 and BRCA2 testing.  Routine pelvic exams to screen for cancer are no longer recommended for nonpregnant women who are considered low risk for cancer of the pelvic organs (ovaries, uterus, and vagina) and who do not have symptoms. Ask your health care provider if a screening pelvic exam is right for you.  If you have had past treatment for cervical cancer or a condition that could lead to cancer, you need Pap tests and screening for cancer for at least 20 years after your treatment. If Pap tests have been discontinued, your risk factors (such as having a new  sexual partner) need to be reassessed to determine if screening should be resumed. Some women have medical problems that increase the chance of getting cervical cancer. In these cases, your health care provider may recommend more frequent screening and Pap tests.  Colorectal cancer can be detected and often prevented. Most routine colorectal cancer screening begins at  the age of 66 years and continues through age 81 years. However, your health care provider may recommend screening at an earlier age if you have risk factors for colon cancer. On a yearly basis, your health care provider may provide home test kits to check for hidden blood in the stool. Use of a small camera at the end of a tube, to directly examine the colon (sigmoidoscopy or colonoscopy), can detect the earliest forms of colorectal cancer. Talk to your health care provider about this at age 85, when routine screening begins.  Direct exam of the colon should be repeated every 5-10 years through age 29 years, unless early forms of pre-cancerous polyps or small growths are found.  Hepatitis C blood testing is recommended for all people born from [birth year] through 1965 and any individual with known risks for hepatitis C.  Pra  Osteoporosis is a disease in which the bones lose minerals and strength with aging. This can result in serious bone fractures or breaks. The risk of osteoporosis can be identified using a bone density scan. Women ages 48 years and over and women at risk for fractures or osteoporosis should discuss screening with their health care providers. Ask your health care provider whether you should take a calcium supplement or vitamin D to reduce the rate of osteoporosis.  Menopause can be associated with physical symptoms and risks. Hormone replacement therapy is available to decrease symptoms and risks. You should talk to your health care provider about whether hormone replacement therapy is right for you.  Use sunscreen. Apply  sunscreen liberally and repeatedly throughout the day. You should seek shade when your shadow is shorter than you. Protect yourself by wearing long sleeves, pants, a wide-brimmed hat, and sunglasses year round, whenever you are outdoors.  Once a month, do a whole body skin exam, using a mirror to look at the skin on your back. Tell your health care provider of new moles, moles that have irregular borders, moles that are larger than a pencil eraser, or moles that have changed in shape or color.  Stay current with required vaccines (immunizations).  Influenza vaccine. All adults should be immunized every year.  Tetanus, diphtheria, and acellular pertussis (Td, Tdap) vaccine. Pregnant women should receive 1 dose of Tdap vaccine during each pregnancy. The dose should be obtained regardless of the length of time since the last dose. Immunization is preferred during the 27th-36th week of gestation. An adult who has not previously received Tdap or who does not know her vaccine status should receive 1 dose of Tdap. This initial dose should be followed by tetanus and diphtheria toxoids (Td) booster doses every 10 years. Adults with an unknown or incomplete history of completing a 3-dose immunization series with Td-containing vaccines should begin or complete a primary immunization series including a Tdap dose. Adults should receive a Td booster every 10 years.  Varicella vaccine. An adult without evidence of immunity to varicella should receive 2 doses or a second dose if she has previously received 1 dose. Pregnant females who do not have evidence of immunity should receive the first dose after pregnancy. This first dose should be obtained before leaving the health care facility. The second dose should be obtained 4-8 weeks after the first dose.  Human papillomavirus (HPV) vaccine. Females aged 13-26 years who have not received the vaccine previously should obtain the 3-dose series. The vaccine is not  recommended for use in pregnant females. However, pregnancy testing is not needed before receiving a  dose. If a female is found to be pregnant after receiving a dose, no treatment is needed. In that case, the remaining doses should be delayed until after the pregnancy. Immunization is recommended for any person with an immunocompromised condition through the age of 42 years if she did not get any or all doses earlier. During the 3-dose series, the second dose should be obtained 4-8 weeks after the first dose. The third dose should be obtained 24 weeks after the first dose and 16 weeks after the second dose.  Zoster vaccine. One dose is recommended for adults aged 39 years or older unless certain conditions are present.  Measles, mumps, and rubella (MMR) vaccine. Adults born before [birth year] generally are considered immune to measles and mumps. Adults born in [birth year] or later should have 1 or more doses of MMR vaccine unless there is a contraindication to the vaccine or there is laboratory evidence of immunity to each of the three diseases. A routine second dose of MMR vaccine should be obtained at least 28 days after the first dose for students attending postsecondary schools, health care workers, or international travelers. People who received inactivated measles vaccine or an unknown type of measles vaccine during 1963-1967 should receive 2 doses of MMR vaccine. People who received inactivated mumps vaccine or an unknown type of mumps vaccine before 1979 and are at high risk for mumps infection should consider immunization with 2 doses of MMR vaccine. For females of childbearing age, rubella immunity should be determined. If there is no evidence of immunity, females who are not pregnant should be vaccinated. If there is no evidence of immunity, females who are pregnant should delay immunization until after pregnancy. Unvaccinated health care workers born before [birth year] who lack laboratory evidence of measles, mumps, or  rubella immunity or laboratory confirmation of disease should consider measles and mumps immunization with 2 doses of MMR vaccine or rubella immunization with 1 dose of MMR vaccine.  Pneumococcal 13-valent conjugate (PCV13) vaccine. When indicated, a person who is uncertain of her immunization history and has no record of immunization should receive the PCV13 vaccine. An adult aged 30 years or older who has certain medical conditions and has not been previously immunized should receive 1 dose of PCV13 vaccine. This PCV13 should be followed with a dose of pneumococcal polysaccharide (PPSV23) vaccine. The PPSV23 vaccine dose should be obtained at least 1 or more year(s) after the dose of PCV13 vaccine. An adult aged 15 years or older who has certain medical conditions and previously received 1 or more doses of PPSV23 vaccine should receive 1 dose of PCV13. The PCV13 vaccine dose should be obtained 1 or more years after the last PPSV23 vaccine dose.    Pneumococcal polysaccharide (PPSV23) vaccine. When PCV13 is also indicated, PCV13 should be obtained first. All adults aged 40 years and older should be immunized. An adult younger than age 34 years who has certain medical conditions should be immunized. Any person who resides in a nursing home or long-term care facility should be immunized. An adult smoker should be immunized. People with an immunocompromised condition and certain other conditions should receive both PCV13 and PPSV23 vaccines. People with human immunodeficiency virus (HIV) infection should be immunized as soon as possible after diagnosis. Immunization during chemotherapy or radiation therapy should be avoided. Routine use of PPSV23 vaccine is not recommended for American Indians, [place] Natives, or people younger than 65 years unless there are medical conditions that require PPSV23 vaccine. When indicated, people who  have unknown immunization and have no record of immunization should receive  PPSV23 vaccine. One-time revaccination 5 years after the first dose of PPSV23 is recommended for people aged 19-64 years who have chronic kidney failure, nephrotic syndrome, asplenia, or immunocompromised conditions. People who received 1-2 doses of PPSV23 before age 19 years should receive another dose of PPSV23 vaccine at age 70 years or later if at least 5 years have passed since the previous dose. Doses of PPSV23 are not needed for people immunized with PPSV23 at or after age 37 years.  Preventive Services / Frequency   Ages 84 to 19 years  Blood pressure check.  Lipid and cholesterol check.  Lung cancer screening. / Every year if you are aged 15-80 years and have a 30-pack-year history of smoking and currently smoke or have quit within the past 15 years. Yearly screening is stopped once you have quit smoking for at least 15 years or develop a health problem that would prevent you from having lung cancer treatment.  Clinical breast exam.** / Every year after age 17 years.   BRCA-related cancer risk assessment.** / For women who have family members with a BRCA-related cancer (breast, ovarian, tubal, or peritoneal cancers).  Mammogram.** / Every year beginning at age 35 years and continuing for as long as you are in good health. Consult with your health care provider.  Pap test.** / Every 3 years starting at age 60 years through age 48 or 46 years with a history of 3 consecutive normal Pap tests.  HPV screening.** / Every 3 years from ages 62 years through ages 64 to 25 years with a history of 3 consecutive normal Pap tests.  Fecal occult blood test (FOBT) of stool. / Every year beginning at age 84 years and continuing until age 9 years. You may not need to do this test if you get a colonoscopy every 10 years.  Flexible sigmoidoscopy or colonoscopy.** / Every 5 years for a flexible sigmoidoscopy or every 10 years for a colonoscopy beginning at age 64 years and continuing until age 71  years.  Hepatitis C blood test.** / For all people born from [birth year] through 1967 and any individual with known risks for hepatitis C.  Skin self-exam. / Monthly.  Influenza vaccine. / Every year.  Tetanus, diphtheria, and acellular pertussis (Tdap/Td) vaccine.** / Consult your health care provider. Pregnant women should receive 1 dose of Tdap vaccine during each pregnancy. 1 dose of Td every 10 years.  Varicella vaccine.** / Consult your health care provider. Pregnant females who do not have evidence of immunity should receive the first dose after pregnancy.  Zoster vaccine.** / 1 dose for adults aged 60 years or older.  Pneumococcal 13-valent conjugate (PCV13) vaccine.** / Consult your health care provider.  Pneumococcal polysaccharide (PPSV23) vaccine.** / 1 to 2 doses if you smoke cigarettes or if you have certain conditions.  Meningococcal vaccine.** / Consult your health care provider.  Hepatitis A vaccine.** / Consult your health care provider.  Hepatitis B vaccine.** / Consult your health care provider. Screening for abdominal aortic aneurysm (AAA)  by ultrasound is recommended for people over 50 who have history of high blood pressure or who are current or former smokers. ++++++++++++++++++ Recommend Adult Low Dose Aspirin or  coated  Aspirin 81 mg daily  To reduce risk of Colon Cancer 40 %,  Skin Cancer 26 % ,  Melanoma 46%  and  Pancreatic cancer 60% +++++++++++++++++++ Vitamin D goal  is between 70-100.  Please make sure that you are taking your Vitamin D as directed.  It is very important as a natural anti-inflammatory  helping hair, skin, and nails, as well as reducing stroke and heart attack risk.  It helps your bones and helps with mood. It also decreases numerous cancer risks so please take it as directed.  Low Vit D is associated with a 200-300% higher risk for CANCER  and 200-300% higher risk for HEART   ATTACK  &  STROKE.    .....................................Marland Kitchen It is also associated with higher death rate at younger ages,  autoimmune diseases like Rheumatoid arthritis, Lupus, Multiple Sclerosis.    Also many other serious conditions, like depression, Alzheimer's Dementia, infertility, muscle aches, fatigue, fibromyalgia - just to name a few. ++++++++++++++++++ Recommend the book "The END of DIETING" by Dr Excell Seltzer  & the book "The END of DIABETES " by Dr Excell Seltzer At [email or web address] - get book & Audio CD's    Being diabetic has a  300% increased risk for heart attack, stroke, cancer, and alzheimer- type vascular dementia. It is very important that you work harder with diet by avoiding all foods that are white. Avoid white rice (brown & wild rice is OK), white potatoes (sweetpotatoes in moderation is OK), White bread or wheat bread or anything made out of white flour like bagels, donuts, rolls, buns, biscuits, cakes, pastries, cookies, pizza crust, and pasta (made from white flour & egg whites) - vegetarian pasta or spinach or wheat pasta is OK. Multigrain breads like Arnold's or [employer], or multigrain sandwich thins or flatbreads.  Diet, exercise and weight loss can reverse and cure diabetes in the early stages.  Diet, exercise and weight loss is very important in the control and prevention of complications of diabetes which affects every system in your body, ie. Brain - dementia/stroke, eyes - glaucoma/blindness, heart - heart attack/heart failure, kidneys - dialysis, stomach - gastric paralysis, intestines - malabsorption, nerves - severe painful neuritis, circulation - gangrene & loss of a leg(s), and finally cancer and Alzheimers.    I recommend avoid fried & greasy foods,  sweets/candy, white rice (brown or wild rice or Quinoa is OK), white potatoes (sweet potatoes are OK) - anything made from white flour - bagels, doughnuts, rolls, buns, biscuits,white and wheat breads, pizza crust and traditional pasta  made of white flour & egg white(vegetarian pasta or spinach or wheat pasta is OK).  Multi-grain bread is OK - like multi-grain flat bread or sandwich thins. Avoid alcohol in excess. Exercise is also important.    Eat all the vegetables you want - avoid meat, especially red meat and dairy - especially cheese.  Cheese is the most concentrated form of trans-fats which is the worst thing to clog up our arteries. Veggie cheese is OK which can be found in the fresh produce section at [employer] or [employer] or [employer]  ++++++++++++++++++++++ DASH Eating Plan  DASH stands for "Dietary Approaches to Stop Hypertension."   The DASH eating plan is a healthy eating plan that has been shown to reduce high blood pressure (hypertension). Additional health benefits may include reducing the risk of type 2 diabetes mellitus, heart disease, and stroke. The DASH eating plan may also help with weight loss. WHAT DO I NEED TO KNOW ABOUT THE DASH EATING PLAN? For the DASH eating plan, you will follow these general guidelines:  Choose foods with a percent daily value for sodium of less than 5% (as listed on the food  label).  Use salt-free seasonings or herbs instead of table salt or sea salt.  Check with your health care provider or pharmacist before using salt substitutes.  Eat lower-sodium products, often labeled as "lower sodium" or "no salt added."  Eat fresh foods.  Eat more vegetables, fruits, and low-fat dairy products.  Choose whole grains. Look for the word "whole" as the first word in the ingredient list.  Choose fish   Limit sweets, desserts, sugars, and sugary drinks.  Choose heart-healthy fats.  Eat veggie cheese   Eat more home-cooked food and less restaurant, buffet, and fast food.  Limit fried foods.  Cook foods using methods other than frying.  Limit canned vegetables. If you do use them, rinse them well to decrease the sodium.  When eating at a restaurant, ask that your  food be prepared with less salt, or no salt if possible.                      WHAT FOODS CAN I EAT? Read Dr Fara Olden Fuhrman's books on The End of Dieting & The End of Diabetes  Grains Whole grain or whole wheat bread. Brown rice. Whole grain or whole wheat pasta. Quinoa, bulgur, and whole grain cereals. Low-sodium cereals. Corn or whole wheat flour tortillas. Whole grain cornbread. Whole grain crackers. Low-sodium crackers.  Vegetables Fresh or frozen vegetables (raw, steamed, roasted, or grilled). Low-sodium or reduced-sodium tomato and vegetable juices. Low-sodium or reduced-sodium tomato sauce and paste. Low-sodium or reduced-sodium canned vegetables.   Fruits All fresh, canned (in natural juice), or frozen fruits.  Protein Products  All fish and seafood.  Dried beans, peas, or lentils. Unsalted nuts and seeds. Unsalted canned beans.  Dairy Low-fat dairy products, such as skim or 1% milk, 2% or reduced-fat cheeses, low-fat ricotta or cottage cheese, or plain low-fat yogurt. Low-sodium or reduced-sodium cheeses.  Fats and Oils Tub margarines without trans fats. Light or reduced-fat mayonnaise and salad dressings (reduced sodium). Avocado. Safflower, olive, or canola oils. Natural peanut or almond butter.  Other Unsalted popcorn and pretzels. The items listed above may not be a complete list of recommended foods or beverages. Contact your dietitian for more options.  ++++++++++++++++++  WHAT FOODS ARE NOT RECOMMENDED? Grains/ White flour or wheat flour White bread. White pasta. White rice. Refined cornbread. Bagels and croissants. Crackers that contain trans fat.  Vegetables  Creamed or fried vegetables. Vegetables in a . Regular canned vegetables. Regular canned tomato sauce and paste. Regular tomato and vegetable juices.  Fruits Dried fruits. Canned fruit in light or heavy syrup. Fruit juice.  Meat and Other Protein Products Meat in general - RED meat & White meat.  Fatty  cuts of meat. Ribs, chicken wings, all processed meats as bacon, sausage, bologna, salami, fatback, hot dogs, bratwurst and packaged luncheon meats.  Dairy Whole or 2% milk, cream, half-and-half, and cream cheese. Whole-fat or sweetened yogurt. Full-fat cheeses or blue cheese. Non-dairy creamers and whipped toppings. Processed cheese, cheese spreads, or cheese curds.  Condiments Onion and garlic salt, seasoned salt, table salt, and sea salt. Canned and packaged gravies. Worcestershire sauce. Tartar sauce. Barbecue sauce. Teriyaki sauce. Soy sauce, including reduced sodium. Steak sauce. Fish sauce. Oyster sauce. Cocktail sauce. Horseradish. Ketchup and mustard. Meat flavorings and tenderizers. Bouillon cubes. Hot sauce. Tabasco sauce. Marinades. Taco seasonings. Relishes.  Fats and Oils Butter, stick margarine, lard, shortening and bacon fat. Coconut, palm kernel, or palm oils. Regular salad dressings.  Pickles and olives. Salted  popcorn and pretzels.  The items listed above may not be a complete list of foods and beverages to avoid.

## 2019-06-06 LAB — LIPID PANEL
Cholesterol: 158 mg/dL (ref <200)
HDL: 53 mg/dL (ref >=50)
LDL Cholesterol (Calc): 72 mg/dL (calc)
Non-HDL Cholesterol (Calc): 105 mg/dL (calc) (ref <130)
Total CHOL/HDL Ratio: 3 (calc) (ref <5.0)
Triglycerides: 252 mg/dL — ABNORMAL HIGH (ref <150)

## 2019-06-06 LAB — CBC WITH DIFFERENTIAL/PLATELET
Absolute Monocytes: 390 cells/uL (ref 200–950)
Basophils Absolute: 19 cells/uL (ref 0–200)
Basophils Relative: 0.4 %
Eosinophils Absolute: 132 cells/uL (ref 15–500)
Eosinophils Relative: 2.8 %
HCT: 45.2 % — ABNORMAL HIGH (ref 35.0–45.0)
Hemoglobin: 15.3 g/dL (ref 11.7–15.5)
Lymphs Abs: 1866 cells/uL (ref 850–3900)
MCH: 32 pg (ref 27.0–33.0)
MCHC: 33.8 g/dL (ref 32.0–36.0)
MCV: 94.6 fL (ref 80.0–100.0)
MPV: 11.8 fL (ref 7.5–12.5)
Monocytes Relative: 8.3 %
Neutro Abs: 2294 cells/uL (ref 1500–7800)
Neutrophils Relative %: 48.8 %
Platelets: 187 10*3/uL (ref 140–400)
RBC: 4.78 10*6/uL (ref 3.80–5.10)
RDW: 12.7 % (ref 11.0–15.0)
Total Lymphocyte: 39.7 %
WBC: 4.7 10*3/uL (ref 3.8–10.8)

## 2019-06-06 LAB — COMPLETE METABOLIC PANEL WITH GFR
AG Ratio: 2.3 (calc) (ref 1.0–2.5)
ALT: 22 U/L (ref 6–29)
AST: 17 U/L (ref 10–35)
Albumin: 4.4 g/dL (ref 3.6–5.1)
Alkaline phosphatase (APISO): 71 U/L (ref 37–153)
BUN/Creatinine Ratio: 12 (calc) (ref 6–22)
BUN: 14 mg/dL (ref 7–25)
CO2: 25 mmol/L (ref 20–32)
Calcium: 9.2 mg/dL (ref 8.6–10.4)
Chloride: 110 mmol/L (ref 98–110)
Creat: 1.15 mg/dL — ABNORMAL HIGH (ref 0.50–0.99)
GFR, Est African American: 60 mL/min/{1.73_m2} (ref >=60)
GFR, Est Non African American: 52 mL/min/{1.73_m2} — ABNORMAL LOW (ref >=60)
Globulin: 1.9 g/dL (calc) (ref 1.9–3.7)
Glucose, Bld: 100 mg/dL — ABNORMAL HIGH (ref 65–99)
Potassium: 4 mmol/L (ref 3.5–5.3)
Sodium: 143 mmol/L (ref 135–146)
Total Bilirubin: 0.4 mg/dL (ref 0.2–1.2)
Total Protein: 6.3 g/dL (ref 6.1–8.1)

## 2019-06-06 LAB — MAGNESIUM: Magnesium: 2 mg/dL (ref 1.5–2.5)

## 2019-06-06 LAB — VITAMIN D 25 HYDROXY (VIT D DEFICIENCY, FRACTURES): Vit D, 25-Hydroxy: >150 ng/mL — ABNORMAL HIGH (ref 30–100)

## 2019-06-06 LAB — HEMOGLOBIN A1C
Hgb A1c MFr Bld: 5.5 % of total Hgb (ref <5.7)
Mean Plasma Glucose: 111 (calc)
eAG (mmol/L): 6.2 (calc)

## 2019-06-06 LAB — TSH: TSH: 2.16 mIU/L (ref 0.40–4.50)

## 2019-06-11 ENCOUNTER — Ambulatory Visit: Payer: BC Managed Care – PPO | Attending: Internal Medicine

## 2019-06-11 DIAGNOSIS — Z23 Encounter for immunization: Secondary | ICD-10-CM

## 2019-06-11 NOTE — Progress Notes (Signed)
   Covid-19 Vaccination Clinic  Name:  Ruth Gregory    MRN: [id number] DOB: [birth year]  06/11/2019  Ruth Gregory was observed post Covid-19 immunization for 15 minutes without incident. She was provided with Vaccine Information Sheet and instruction to access the V-Safe system.   Ruth Gregory was instructed to call 911 with any severe reactions post vaccine: Marland Kitchen Difficulty breathing  . Swelling of face and throat  . A fast heartbeat  . A bad rash all over body  . Dizziness and weakness   Immunizations Administered    Name Date Dose VIS Date Route   Pfizer COVID-19 Vaccine 06/11/2019  2:05 PM 0.3 mL 03/09/2019 Intramuscular   Manufacturer: [employer], [employer]   Lot: [id number]   NDC: [address]-2

## 2019-06-18 ENCOUNTER — Ambulatory Visit (INDEPENDENT_AMBULATORY_CARE_PROVIDER_SITE_OTHER): Payer: BC Managed Care – PPO | Admitting: Neurology

## 2019-06-18 ENCOUNTER — Encounter: Payer: Self-pay | Admitting: Neurology

## 2019-06-18 ENCOUNTER — Other Ambulatory Visit: Payer: Self-pay

## 2019-06-18 VITALS — BP 142/88 | HR 72 | Temp 97.3°F | Ht 69.0 in | Wt 161.0 lb

## 2019-06-18 DIAGNOSIS — IMO0002 Reserved for concepts with insufficient information to code with codable children: Secondary | ICD-10-CM

## 2019-06-18 DIAGNOSIS — I69954 Hemiplegia and hemiparesis following unspecified cerebrovascular disease affecting left non-dominant side: Secondary | ICD-10-CM | POA: Diagnosis not present

## 2019-06-18 DIAGNOSIS — G43709 Chronic migraine without aura, not intractable, without status migrainosus: Secondary | ICD-10-CM

## 2019-06-18 MED ORDER — ONABOTULINUMTOXINA 100 UNITS IJ SOLR
400.0000 [IU] | Freq: Once | INTRAMUSCULAR | Status: AC
  Administered 2019-06-18: 400 [IU] via INTRAMUSCULAR

## 2019-06-18 NOTE — Progress Notes (Signed)
Botox 200 units x 2 vials, NDC 6734-1937-90, Lot [id number], Exp 02/2022, office supply.//mck,rn**
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
## 2019-06-20 ENCOUNTER — Ambulatory Visit: Payer: Self-pay | Admitting: Neurology

## 2019-06-20 DIAGNOSIS — I129 Hypertensive chronic kidney disease with stage 1 through stage 4 chronic kidney disease, or unspecified chronic kidney disease: Secondary | ICD-10-CM | POA: Diagnosis not present

## 2019-06-20 DIAGNOSIS — E559 Vitamin D deficiency, unspecified: Secondary | ICD-10-CM | POA: Diagnosis not present

## 2019-06-20 DIAGNOSIS — D631 Anemia in chronic kidney disease: Secondary | ICD-10-CM | POA: Diagnosis not present

## 2019-06-20 DIAGNOSIS — N182 Chronic kidney disease, stage 2 (mild): Secondary | ICD-10-CM | POA: Diagnosis not present

## 2019-06-28 ENCOUNTER — Other Ambulatory Visit: Payer: Self-pay | Admitting: Neurology

## 2019-07-01 ENCOUNTER — Other Ambulatory Visit: Payer: Self-pay

## 2019-07-01 DIAGNOSIS — F419 Anxiety disorder, unspecified: Secondary | ICD-10-CM

## 2019-07-01 MED ORDER — ALPRAZOLAM 1 MG PO TABS: ORAL_TABLET | ORAL | 0 refills | Status: DC

## 2019-07-04 ENCOUNTER — Encounter: Payer: Self-pay | Admitting: Family Medicine

## 2019-07-04 ENCOUNTER — Ambulatory Visit (INDEPENDENT_AMBULATORY_CARE_PROVIDER_SITE_OTHER): Payer: BC Managed Care – PPO | Admitting: Family Medicine

## 2019-07-04 ENCOUNTER — Ambulatory Visit: Payer: BC Managed Care – PPO | Attending: Internal Medicine

## 2019-07-04 ENCOUNTER — Other Ambulatory Visit: Payer: Self-pay

## 2019-07-04 ENCOUNTER — Ambulatory Visit: Payer: Self-pay

## 2019-07-04 DIAGNOSIS — Z23 Encounter for immunization: Secondary | ICD-10-CM

## 2019-07-04 DIAGNOSIS — M542 Cervicalgia: Secondary | ICD-10-CM | POA: Diagnosis not present

## 2019-07-04 NOTE — Progress Notes (Signed)
Office Visit Note   Patient: Ruth Gregory           Date of Birth: April 10, 1960           MRN: [id number] Visit Date: 07/04/2019 Requested by: Unk Pinto, [address] [place],  [place] [address] PCP: Unk Pinto, MD  Subjective: Chief Complaint  Patient presents with  . Neck - Pain    HPI: She is here with right-sided neck pain.  Symptoms started a couple weeks ago.  She has been working out with a Clinical research associate at [employer] and was doing some Thera-Band exercises pinching her shoulder blades together, and she felt a twinge of pain on the right side.  She has had discomfort since then with radiation to the neck.  It hurts to look upward or to turn her head to the right.  Pain does not travel down the arm, no weakness or numbness.  She does have a history of some sort of congenital neck problem diagnosed 25 years ago in our office.  At that time she was having neck pain with right arm pain, eventually the pain went away after taking prednisone.  She has not had much trouble since then.  She is right-hand dominant.  She had a stroke several years ago affecting her left side but she has had a very good recovery.              ROS: No fevers or chills.  All other systems were reviewed and are negative.  Objective: Vital Signs: There were no vitals taken for this visit.  Physical Exam:  General:  Alert and oriented, in no acute distress. Pulm:  Breathing unlabored. Psy:  Normal mood, congruent affect. Skin: No rash. Neck: She has good range of motion with pain at the extreme of rotation to the right and upward gaze.  Spurling's test is negative.  She has tenderness in the right-sided lower cervical paraspinous muscles and multiple trigger points in the rhomboid region.  Upper extremity strength and reflexes are normal.  Imaging: X-ray cervical spine: Moderate to severe C5-6 and C6-7 degenerative disc disease with facet arthropathy.  Right-sided carotid stent  visible.  Surgical clips anteriorly.  Assessment & Plan: 1.  Myofascial neck pain with underlying degenerative disc disease and facet arthropathy -Physical therapy at [facility] in [place]. -She will take prednisone if needed.  Could contemplate MRI scan if she fails to improve.     Procedures: No procedures performed  No notes on file     PMFS History: Patient Active Problem List   Diagnosis Date Noted  . Spastic hemiplegia of left nondominant side as late effect of cerebrovascular disease ([place]) 06/18/2019  . Abnormal glucose 07/27/2018  . Labile hypertension 07/27/2018  . Fatty liver 10/09/2017  . GERD (gastroesophageal reflux disease) 10/07/2017  . Anxiety 10/07/2017  . Hypothyroidism 07/23/2015  . Vitamin D deficiency 10/03/2013  . Medication management 10/03/2013  . History of prediabetes 10/03/2013  . ADD (attention deficit disorder) without hyperactivity 10/03/2013  . Hyperlipidemia, mixed 04/04/2013  . Sinusitis, chronic 04/04/2013  . Chronic migraine 09/06/2012  . Spastic hemiplegia affecting left nondominant side ([place]) 09/06/2012  . History of CVA (cerebrovascular accident)    Past Medical History:  Diagnosis Date  . Anemia   . Anxiety   . Asthma   . Chronic heartburn   . Depression   . Elevated cholesterol    on Crestor  . Headache(784.0)   . Hypertension   .  Hypothyroidism    S/P thyroidectomy  . Migraine   . Occlusion and stenosis of carotid artery with cerebral infarction   . Spasm of muscle   . Unspecified cerebral artery occlusion with cerebral infarction   . Vitamin D deficiency     Family History  Problem Relation Age of Onset  . Heart disease Mother   . Peptic Ulcer Disease Mother   . Heart disease Father   . Parkinson's disease Father     Past Surgical History:  Procedure Laterality Date  . AUGMENTATION MAMMAPLASTY Bilateral 2000  . COSMETIC SURGERY    . CYSTOCELE REPAIR N/A 01/08/2013   Procedure: ANTERIOR REPAIR  (CYSTOCELE);  Surgeon: Meriel Pica, MD;  Location: [facility];  Service: Gynecology;  Laterality: N/A;  . NASAL SINUS SURGERY    . VAGINAL HYSTERECTOMY     Social History   Occupational History  . Occupation: DESIGNER    Employer: [employer]  Tobacco Use  . Smoking status: Never Smoker  . Smokeless tobacco: Never Used  Substance and Sexual Activity  . Alcohol use: Yes    Comment: occassionally drinks a glass of wine  . Drug use: No  . Sexual activity: Not on file

## 2019-07-04 NOTE — Progress Notes (Signed)
   Covid-19 Vaccination Clinic  Name:  Ruth Gregory    MRN: [id number] DOB: 07/01/1960  07/04/2019  [name] was observed post Covid-19 immunization for 15 minutes without incident. She was provided with Vaccine Information Sheet and instruction to access the V-Safe system.   [name] was instructed to call 911 with any severe reactions post vaccine: Marland Kitchen Difficulty breathing  . Swelling of face and throat  . A fast heartbeat  . A bad rash all over body  . Dizziness and weakness   Immunizations Administered    Name Date Dose VIS Date Route   Pfizer COVID-19 Vaccine 07/04/2019 12:51 PM 0.3 mL 03/09/2019 Intramuscular   Manufacturer: [employer], [employer]   Lot: [id number]   NDC: [address]-4

## 2019-07-04 NOTE — Progress Notes (Signed)
No numbness and tingling in arms Feels knot on right side of neck

## 2019-07-15 ENCOUNTER — Other Ambulatory Visit: Payer: Self-pay | Admitting: Internal Medicine

## 2019-07-16 DIAGNOSIS — M542 Cervicalgia: Secondary | ICD-10-CM | POA: Diagnosis not present

## 2019-07-20 DIAGNOSIS — M542 Cervicalgia: Secondary | ICD-10-CM | POA: Diagnosis not present

## 2019-08-06 ENCOUNTER — Encounter: Payer: Self-pay | Admitting: Physician Assistant

## 2019-08-06 ENCOUNTER — Other Ambulatory Visit: Payer: Self-pay

## 2019-08-06 ENCOUNTER — Ambulatory Visit (INDEPENDENT_AMBULATORY_CARE_PROVIDER_SITE_OTHER): Payer: BC Managed Care – PPO | Admitting: Physician Assistant

## 2019-08-06 VITALS — HR 89 | Temp 97.3°F | Ht 69.0 in | Wt 161.2 lb

## 2019-08-06 DIAGNOSIS — R0602 Shortness of breath: Secondary | ICD-10-CM

## 2019-08-06 DIAGNOSIS — R002 Palpitations: Secondary | ICD-10-CM

## 2019-08-06 LAB — COMPLETE METABOLIC PANEL WITH GFR
AG Ratio: 2.1 (calc) (ref 1.0–2.5)
ALT: 29 U/L (ref 6–29)
AST: 26 U/L (ref 10–35)
Albumin: 4.6 g/dL (ref 3.6–5.1)
Alkaline phosphatase (APISO): 71 U/L (ref 37–153)
BUN/Creatinine Ratio: 15 (calc) (ref 6–22)
BUN: 16 mg/dL (ref 7–25)
CO2: 26 mmol/L (ref 20–32)
Calcium: 9.3 mg/dL (ref 8.6–10.4)
Chloride: 107 mmol/L (ref 98–110)
Creat: 1.08 mg/dL — ABNORMAL HIGH (ref 0.50–0.99)
GFR, Est African American: 65 mL/min/{1.73_m2} (ref >=60)
GFR, Est Non African American: 56 mL/min/{1.73_m2} — ABNORMAL LOW (ref >=60)
Globulin: 2.2 g/dL (calc) (ref 1.9–3.7)
Glucose, Bld: 111 mg/dL — ABNORMAL HIGH (ref 65–99)
Potassium: 4.1 mmol/L (ref 3.5–5.3)
Sodium: 141 mmol/L (ref 135–146)
Total Bilirubin: 0.4 mg/dL (ref 0.2–1.2)
Total Protein: 6.8 g/dL (ref 6.1–8.1)

## 2019-08-06 LAB — CBC WITH DIFFERENTIAL/PLATELET
Absolute Monocytes: 260 cells/uL (ref 200–950)
Basophils Absolute: 29 cells/uL (ref 0–200)
Basophils Relative: 0.6 %
Eosinophils Absolute: 172 cells/uL (ref 15–500)
Eosinophils Relative: 3.5 %
HCT: 45.8 % — ABNORMAL HIGH (ref 35.0–45.0)
Hemoglobin: 15.8 g/dL — ABNORMAL HIGH (ref 11.7–15.5)
Lymphs Abs: 1372 cells/uL (ref 850–3900)
MCH: 31.9 pg (ref 27.0–33.0)
MCHC: 34.5 g/dL (ref 32.0–36.0)
MCV: 92.3 fL (ref 80.0–100.0)
MPV: 11.4 fL (ref 7.5–12.5)
Monocytes Relative: 5.3 %
Neutro Abs: 3067 cells/uL (ref 1500–7800)
Neutrophils Relative %: 62.6 %
Platelets: 189 10*3/uL (ref 140–400)
RBC: 4.96 10*6/uL (ref 3.80–5.10)
RDW: 12.3 % (ref 11.0–15.0)
Total Lymphocyte: 28 %
WBC: 4.9 10*3/uL (ref 3.8–10.8)

## 2019-08-06 LAB — TSH: TSH: 0.74 mIU/L (ref 0.40–4.50)

## 2019-08-06 LAB — MAGNESIUM: Magnesium: 2 mg/dL (ref 1.5–2.5)

## 2019-08-06 NOTE — Progress Notes (Signed)
Subjective:    Patient ID: Ruth Gregory, female    DOB: [birth year], 59 y.o.   MRN: [id number]  HPI 59 y.o. WF with history of CVA, migraines, presents for possible shingles but this symptoms has gone away.  She was under a lot of stress for Ian's wedding at [facility], had 180 people. She states when they got back she had left ear pain, pain in her neck, sinus issues, this has resolved.   She has been on breo, albuterol, Singulair. She was going to the gym but she had to stop due to SOB x 2 months. No coughing, no wheezing, no leg swelling. She has been having palpitations x 2-3 months, nonexertional, no symptoms, lasts for seconds but she will take deep breaths.  Last CXR was 2014. Last COVID vaccine shot was 04/07.   She is on too much vitamin D, over 150. She is on 50,000 IU 3 x a week, occ every day. Will stop for 3 months.   Blood pressure 120/82, pulse 89, temperature (!) 97.3 F (36.3 C), weight 161 lb 3.2 oz (73.1 kg), SpO2 96 %.  Medications  Current Outpatient Medications (Endocrine & Metabolic):  .  levothyroxine (SYNTHROID) 88 MCG tablet, TAKE 1 TABLET BY MOUTH EVERY DAY .  predniSONE (DELTASONE) 20 MG tablet, 1 TAB 3 TIMES A DAY FOR 3 DAYS, THEN 1 TAB 2 TIMES A DAY FOR 3 DAYS, THEN 1 TAB 1 TIME A DAY FOR 5 DAYS  Current Outpatient Medications (Cardiovascular):  .  fenofibrate micronized (LOFIBRA) 134 MG capsule, TAKE 1 CAPSULE EVERY DAY .  rosuvastatin (CRESTOR) 40 MG tablet, Take 1 tablet (40 mg total) by mouth daily. Take 1 tablet Daily for Cholesterol .  verapamil (CALAN-SR) 240 MG CR tablet, Take 1 tablet Daily with Food for BP & Migraine Prevention  Current Outpatient Medications (Respiratory):  .  albuterol (PROVENTIL HFA) 108 (90 Base) MCG/ACT inhaler, 1 to 2 inhalations 10-15 minutes apart every 4 hours if needed for asthma rescue .  BREO ELLIPTA 100-25 MCG/INH AEPB, 1 INHALATION DAILY TO PREVENT ASTHMA .  cetirizine (ZYRTEC) 10 MG tablet, Take 10 mg by  mouth daily. .  montelukast (SINGULAIR) 10 MG tablet, Take 1 tablet Daily  for Allergies.  Current Outpatient Medications (Analgesics):  .  aspirin EC 81 MG tablet, Take 81 mg by mouth daily. .  butalbital-acetaminophen-caffeine (FIORICET) 50-325-40 MG tablet, TAKE 1 TABLET BY MOUTH DAILY AS NEEDED FOR HEADACHE. DO NOT REFILL IN LESS THAN 30 DAYS, .  Diclofenac Potassium,Migraine, (CAMBIA) 50 MG PACK, MIX 1 PACK WITH 1-2 OUNCES OF WATER AND DRINK AT ONSET OF MIGRAINE CAN REPEAT IN 2 HOURS IF NEEDED MAX 2/24 HOURS  Current Outpatient Medications (Hematological):  .  vitamin B-12 (CYANOCOBALAMIN) 1000 MCG tablet, Take 1,000 mcg by mouth daily.  Current Outpatient Medications (Other):  Marland Kitchen  ALPRAZolam (XANAX) 1 MG tablet, Take 1/2-1 tablet at Bedtime ONLY if needed for Sleep  &  limit to 5 days /week to avoid Addiction & Dementia .  Botulinum Toxin Type A (BOTOX) 200 UNITS SOLR, Inject 400 Units as directed every 3 (three) months. Change to BOTOX A 100  units .  buPROPion (WELLBUTRIN XL) 300 MG 24 hr tablet, Take 1 tablet every Morning for Mood. Focus & Concentration .  Estradiol (YUVAFEM) 10 MCG TABS vaginal tablet, Place 10 mcg vaginally 2 (two) times a week. .  magnesium oxide (MAG-OX) 400 MG tablet, Take 400 mg by mouth 2 (two) times daily.  Marland Kitchen  Multiple Vitamin (MULTIVITAMIN WITH MINERALS) TABS tablet, Take 1 tablet by mouth daily. .  Multiple Vitamins-Minerals (ZINC PO), Take 1 tablet by mouth daily. .  nortriptyline (PAMELOR) 25 MG capsule, Take 25 mg by mouth at bedtime. Marland Kitchen  omeprazole (PRILOSEC) 40 MG capsule, TAKE 1 CAPSULE DAILY FOR ACID INDIGESTION & REFLUX .  ondansetron (ZOFRAN ODT) 8 MG disintegrating tablet, Dissolve 1 tablet under tongue every 6 to 8 hours for nausea  or vomitting .  prochlorperazine (COMPAZINE) 5 MG tablet, TAKE 1 TABLET BY MOUTH 3 TIMES A DAY AS NEEDED FOR NAUSEA .  Riboflavin 100 MG TABS, Take 100 mg by mouth. bid .  topiramate (TOPAMAX) 100 MG tablet, Take 2  tablets (200 mg total) by mouth daily. .  Vitamin D, Ergocalciferol, (DRISDOL) 1.25 MG (50000 UT) CAPS capsule, Take 1 capsule daily for Vitamin D deficiency. Marland Kitchen  amphetamine-dextroamphetamine (ADDERALL) 10 MG tablet, Take 1/2 to 1 tablet 1 or 2 x daily for ADD  Problem list She has History of CVA (cerebrovascular accident); Chronic migraine; Spastic hemiplegia affecting left nondominant side (HCC); Hyperlipidemia, mixed; Sinusitis, chronic; Vitamin D deficiency; Medication management; History of prediabetes; ADD (attention deficit disorder) without hyperactivity; Hypothyroidism; GERD (gastroesophageal reflux disease); Anxiety; Fatty liver; Abnormal glucose; Labile hypertension; and Spastic hemiplegia of left nondominant side as late effect of cerebrovascular disease (HCC) on their problem list.  Immunization History  Administered Date(s) Administered  . Influenza Inj Mdck Quad With Preservative 01/20/2017, 01/09/2018, 01/10/2019  . Influenza Split 01/24/2014  . Influenza, Seasonal, Injecte, Preservative Fre 02/09/2016  . Influenza-Unspecified 11/28/2011  . PFIZER SARS-COV-2 Vaccination 06/11/2019, 07/04/2019  . PPD Test 04/04/2013, 04/22/2015, 05/18/2016, 06/20/2017, 07/27/2018  . Pneumococcal Polysaccharide-23 03/29/1998  . Td 03/29/2002  . Tdap 05/18/2016    Review of Systems  Constitutional: Negative for chills, diaphoresis and fever.  HENT: Negative for congestion, ear discharge, ear pain, hearing loss, nosebleeds, sore throat and tinnitus.   Eyes: Negative.   Respiratory: Negative for cough, shortness of breath, wheezing and stridor.   Cardiovascular: Negative.  Negative for chest pain and leg swelling.  Gastrointestinal: Negative for abdominal distention, abdominal pain, anal bleeding, blood in stool, constipation, diarrhea, nausea, rectal pain and vomiting.  Genitourinary: Negative for dysuria, frequency and urgency.  Musculoskeletal: Positive for neck pain. Negative for back pain  and myalgias.  Skin: Negative.   Neurological: Positive for numbness and headaches. Negative for dizziness, tremors, seizures and weakness.  Psychiatric/Behavioral: Negative for hallucinations and suicidal ideas. The patient is nervous/anxious.        Objective:   Physical Exam General Appearance: Well nourished, in no apparent distress. Eyes: PERRLA, EOMs, conjunctiva no swelling or erythema Sinuses: No Frontal/maxillary tenderness ENT/Mouth: Ext aud canals clear, TMs without erythema, bulging. No erythema, swelling, or exudate on post pharynx.  Tonsils not swollen or erythematous. Hearing normal.  Neck: Supple, thyroid normal.  Respiratory: Respiratory effort normal, BS equal bilaterally without rales, rhonchi, wheezing or stridor.  Cardio: RRR with no MRGs. Brisk peripheral pulses without edema.  Abdomen: Soft, + BS.  Non tender, no guarding, rebound, hernias, masses. Lymphatics: Non tender without lymphadenopathy.  Musculoskeletal: Full ROM, 5/5 strength right, 4/5 strength on left. normal gait, slow. Skin: Warm, dry without rashes, lesions, ecchymosis.  Neuro: Cranial nerves intact. No cerebellar symptoms.  Psych: Awake and oriented X 3, normal affect, Insight and Judgment appropriate.        Assessment & Plan:    Palpitations Palpitations  check labs, r/u anemia, TSH, dehydration, electrolytes imbalance  if  worse will go to ER, will check labs and holter first and then refer to cardiology per patient preference. It is not exertional, no chest pain or accompaniments.  Increase water, decrease alcohol/caffeine, valsalva maneuvers taught -     CBC with Differential/Platelet -     COMPLETE METABOLIC PANEL WITH GFR -     TSH -     Magnesium -     EKG 12-Lead -     LONG TERM MONITOR-LIVE TELEMETRY (3-14 DAYS); Future  Shortness of breath Continue breo, get CXR, check labs The patient was advised to call immediately if she has any concerning symptoms in the interval. The  patient voices understanding of current treatment options and is in agreement with the current care plan.The patient knows to call the clinic with any problems, questions or concerns or go to the ER if any further progression of symptoms.  -     CBC with Differential/Platelet -     COMPLETE METABOLIC PANEL WITH GFR -     TSH -     Magnesium -     EKG 12-Lead -     LONG TERM MONITOR-LIVE TELEMETRY (3-14 DAYS); Future   Future Appointments  Date Time Provider Department Center  09/06/2019 11:00 AM Lucky Cowboy, MD [facility] None  09/26/2019  3:30 PM Levert Feinstein, MD [facility] None

## 2019-08-06 NOTE — Patient Instructions (Addendum)
INFORMATION ABOUT YOUR XRAY  Can walk into [address] building for an Personal assistant. They will have the order and take you back. You do not any paper work, I should get the result back today or tomorrow. This order is good for a year.  Can call [phone] to schedule an appointment if you wish.   Please call Dr. Madilyn Fireman office and get your colonoscopy scheuduled  Colon cancer is 3rd most diagnosed cancer and 2nd leading cause of death in both men and women 25 years of age and older despite being one of the most preventable and treatable cancers if found early.  4 of out 5 people diagnosed with colon cancer have NO prior family history.  When caught EARLY 90% of colon cancer is curable.  Will get labs, refer to cardiology and get holter montior.

## 2019-08-08 DIAGNOSIS — R002 Palpitations: Secondary | ICD-10-CM

## 2019-08-09 ENCOUNTER — Other Ambulatory Visit: Payer: Self-pay | Admitting: Physician Assistant

## 2019-08-09 DIAGNOSIS — R002 Palpitations: Secondary | ICD-10-CM

## 2019-08-09 DIAGNOSIS — R0602 Shortness of breath: Secondary | ICD-10-CM

## 2019-08-09 NOTE — Telephone Encounter (Signed)
-----   Message from Ernst Bowler sent at 08/09/2019  7:55 AM EDT ----- Regarding: Long term monitor-Live Telemetry order Hi Marchelle Folks,  You ordered a Long term monitor-Live Telemetry [phone] (ZIO AT), which is telemetry and not covered by this patients BCBS. Would you consider changing the order to Long term monitor [id number] (ZIO XT), which is a holter recorder 3- 14 days? Please let me know. Thanks,  Press photographer

## 2019-08-09 NOTE — Progress Notes (Signed)
CXR order

## 2019-08-14 ENCOUNTER — Encounter: Payer: BLUE CROSS/BLUE SHIELD | Admitting: Internal Medicine

## 2019-08-14 ENCOUNTER — Ambulatory Visit
Admission: RE | Admit: 2019-08-14 | Discharge: 2019-08-14 | Disposition: A | Discharge status: Home / Self Care | Payer: BC Managed Care – PPO | Source: Ambulatory Visit | Attending: Physician Assistant | Admitting: Physician Assistant

## 2019-08-14 ENCOUNTER — Other Ambulatory Visit: Payer: Self-pay

## 2019-08-14 DIAGNOSIS — R0602 Shortness of breath: Secondary | ICD-10-CM

## 2019-08-14 DIAGNOSIS — R002 Palpitations: Secondary | ICD-10-CM

## 2019-08-14 DIAGNOSIS — M542 Cervicalgia: Secondary | ICD-10-CM | POA: Diagnosis not present

## 2019-08-16 ENCOUNTER — Telehealth: Payer: Self-pay | Admitting: Neurology

## 2019-08-16 MED ORDER — CAMBIA 50 MG PO PACK: PACK | ORAL | 11 refills | Status: DC

## 2019-08-16 NOTE — Telephone Encounter (Signed)
Pt called to ask what pharmacy is recommended for her to get her Diclofenac Potassium,Migraine, (CAMBIA) 50 MG PACK  Due to the cost of medication at her current pharmacy

## 2019-08-16 NOTE — Telephone Encounter (Signed)
[facility] told the patient that they are no longer carrying Cambia. She needs her Cambia sent to [facility] (phone #[phone]). They offer a cash discount. Rx sent electronically.

## 2019-08-20 DIAGNOSIS — L821 Other seborrheic keratosis: Secondary | ICD-10-CM | POA: Diagnosis not present

## 2019-08-20 DIAGNOSIS — Z85828 Personal history of other malignant neoplasm of skin: Secondary | ICD-10-CM | POA: Diagnosis not present

## 2019-08-20 DIAGNOSIS — L570 Actinic keratosis: Secondary | ICD-10-CM | POA: Diagnosis not present

## 2019-08-20 DIAGNOSIS — L814 Other melanin hyperpigmentation: Secondary | ICD-10-CM | POA: Diagnosis not present

## 2019-08-21 DIAGNOSIS — Z0289 Encounter for other administrative examinations: Secondary | ICD-10-CM

## 2019-08-22 ENCOUNTER — Telehealth: Payer: Self-pay | Admitting: *Deleted

## 2019-08-22 NOTE — Telephone Encounter (Signed)
Pt attending form @ the front desk for p/u

## 2019-08-25 ENCOUNTER — Ambulatory Visit (INDEPENDENT_AMBULATORY_CARE_PROVIDER_SITE_OTHER): Payer: BC Managed Care – PPO

## 2019-08-25 DIAGNOSIS — R002 Palpitations: Secondary | ICD-10-CM | POA: Diagnosis not present

## 2019-08-29 DIAGNOSIS — M542 Cervicalgia: Secondary | ICD-10-CM | POA: Diagnosis not present

## 2019-09-05 ENCOUNTER — Encounter: Payer: Self-pay | Admitting: Internal Medicine

## 2019-09-05 NOTE — Patient Instructions (Signed)

## 2019-09-05 NOTE — Progress Notes (Signed)
Annual Screening/Preventative Visit & Comprehensive Evaluation &  Examination     This very nice 59 y.o. MWF  presents for a Screening /Preventative Visit & comprehensive evaluation and management of multiple medical co-morbidities.  Patient has been followed for HTN, HLD, Prediabetes  and Vitamin D Deficiency.      HTN predates since 2009, when she had a Rt MCA CVA consequent of a Rt Car Aa dissection attributed to Fibromuscular Dysplasia. Patient is followed by Dr Terrace Arabia for her mild residual spastic Lt HP and hx/o migraines.  Patient's BP has been controlled at home and patient denies any cardiac symptoms as chest pain, palpitations, shortness of breath, dizziness or ankle swelling. Today's BP is at goal - 110/80.      Patient's hyperlipidemia is controlled with diet and medications. Patient denies myalgias or other medication SE's. Last lipids were at goal except elevated Trig's:  Lab Results  Component Value Date   CHOL 158 06/05/2019   HDL 53 06/05/2019   LDLCALC 72 06/05/2019   TRIG 252 (H) 06/05/2019   CHOLHDL 3.0 06/05/2019       Patient is on Thyroid replacement s/p remote Thyroid surgery for goiter in 2007. Patient has hx/o prediabetes predating (A1c 5.8% / 2008) and patient denies reactive hypoglycemic symptoms, visual blurring, diabetic polys or paresthesias. Last A1c was Normal & at goal:  Lab Results  Component Value Date   HGBA1C 5.5 06/05/2019       Finally, patient has history of Vitamin D Deficiency ("23" / 2008) and last Vitamin D was elevated & dose was stopped 3 months ago:  Lab Results  Component Value Date   VD25OH >150 (H) 06/05/2019    Current Outpatient Medications on File Prior to Visit  Medication Sig  . albuterol (PROVENTIL HFA) 108 (90 Base) MCG/ACT inhaler 1 to 2 inhalations 10-15 minutes apart every 4 hours if needed for asthma rescue  . ALPRAZolam (XANAX) 1 MG tablet Take 1/2-1 tablet at Bedtime ONLY if needed for Sleep  &  limit to 5 days /week to  avoid Addiction & Dementia  . aspirin EC 81 MG tablet Take 81 mg by mouth daily.  . Botulinum Toxin Type A (BOTOX) 200 UNITS SOLR Inject 400 Units as directed every 3 (three) months. Change to BOTOX A 100  units  . BREO ELLIPTA 100-25 MCG/INH AEPB 1 INHALATION DAILY TO PREVENT ASTHMA  . buPROPion (WELLBUTRIN XL) 300 MG 24 hr tablet Take 1 tablet every Morning for Mood. Focus & Concentration  . butalbital-acetaminophen-caffeine (FIORICET) 50-325-40 MG tablet TAKE 1 TABLET BY MOUTH DAILY AS NEEDED FOR HEADACHE. DO NOT REFILL IN LESS THAN 30 DAYS,  . cetirizine (ZYRTEC) 10 MG tablet Take 10 mg by mouth daily.  . Diclofenac Potassium,Migraine, (CAMBIA) 50 MG PACK MIX 1 PACK WITH 1-2 OUNCES OF WATER AND DRINK AT ONSET OF MIGRAINE CAN REPEAT IN 2 HOURS IF NEEDED MAX 2/24 HOURS  . Estradiol (YUVAFEM) 10 MCG TABS vaginal tablet Place 10 mcg vaginally 2 (two) times a week.  . fenofibrate micronized (LOFIBRA) 134 MG capsule TAKE 1 CAPSULE EVERY DAY  . levothyroxine (SYNTHROID) 88 MCG tablet TAKE 1 TABLET BY MOUTH EVERY DAY  . magnesium oxide (MAG-OX) 400 MG tablet Take 400 mg by mouth 2 (two) times daily.   . montelukast (SINGULAIR) 10 MG tablet Take 1 tablet Daily  for Allergies.  . Multiple Vitamin (MULTIVITAMIN WITH MINERALS) TABS tablet Take 1 tablet by mouth daily.  . Multiple Vitamins-Minerals (ZINC PO) Take 1 tablet  by mouth daily.  . nortriptyline (PAMELOR) 25 MG capsule Take 25 mg by mouth at bedtime.  Marland Kitchen omeprazole (PRILOSEC) 40 MG capsule TAKE 1 CAPSULE DAILY FOR ACID INDIGESTION & REFLUX  . ondansetron (ZOFRAN ODT) 8 MG disintegrating tablet Dissolve 1 tablet under tongue every 6 to 8 hours for nausea  or vomitting  . predniSONE (DELTASONE) 20 MG tablet 1 TAB 3 TIMES A DAY FOR 3 DAYS, THEN 1 TAB 2 TIMES A DAY FOR 3 DAYS, THEN 1 TAB 1 TIME A DAY FOR 5 DAYS  . Riboflavin 100 MG TABS Take 100 mg by mouth. bid  . rosuvastatin (CRESTOR) 40 MG tablet Take 1 tablet (40 mg total) by mouth daily. Take 1  tablet Daily for Cholesterol  . topiramate (TOPAMAX) 100 MG tablet Take 2 tablets (200 mg total) by mouth daily.  . verapamil (CALAN-SR) 240 MG CR tablet Take 1 tablet Daily with Food for BP & Migraine Prevention  . vitamin B-12 (CYANOCOBALAMIN) 1000 MCG tablet Take 1,000 mcg by mouth daily.  . Vitamin D, Ergocalciferol, (DRISDOL) 1.25 MG (50000 UT) CAPS capsule Take 1 capsule daily for Vitamin D deficiency.  Marland Kitchen amphetamine-dextroamphetamine (ADDERALL) 10 MG tablet Take 1/2 to 1 tablet 1 or 2 x daily for ADD   No current facility-administered medications on file prior to visit.   Allergies  Allergen Reactions  . Penicillins Anaphylaxis and Hives  . Imitrex [Sumatriptan]   . Other Swelling    kiwi  . Sulfa Antibiotics Hives  . Zomig [Zolmitriptan]    Past Medical History:  Diagnosis Date  . Anemia   . Anxiety   . Asthma   . Chronic heartburn   . Depression   . Elevated cholesterol    on Crestor  . Headache(784.0)   . Hypertension   . Hypothyroidism    S/P thyroidectomy  . Migraine   . Occlusion and stenosis of carotid artery with cerebral infarction   . Spasm of muscle   . Unspecified cerebral artery occlusion with cerebral infarction   . Vitamin D deficiency    Health Maintenance  Topic Date Due  . COLONOSCOPY  Never done  . PAP SMEAR-Modifier  05/07/2017  . INFLUENZA VACCINE  10/28/2019  . MAMMOGRAM  12/27/2020  . TETANUS/TDAP  05/18/2026  . COVID-19 Vaccine  Completed  . Hepatitis C Screening  Completed  . HIV Screening  Completed   Immunization History  Administered Date(s) Administered  . Influenza Inj Mdck Quad With Preservative 01/20/2017, 01/09/2018, 01/10/2019  . Influenza Split 01/24/2014  . Influenza, Seasonal, Injecte, Preservative Fre 02/09/2016  . Influenza-Unspecified 11/28/2011  . PFIZER SARS-COV-2 Vaccination 06/11/2019, 07/04/2019  . PPD Test 04/04/2013, 04/22/2015, 05/18/2016, 06/20/2017, 07/27/2018  . Pneumococcal Polysaccharide-23 03/29/1998    . Td 03/29/2002  . Tdap 05/18/2016    Last Colon - 12/2009 at [facility] & recommended 10 year f/u due Oct 2021  Last MGM - 12/28/2018  Past Surgical History:  Procedure Laterality Date  . AUGMENTATION MAMMAPLASTY Bilateral 2000  . COSMETIC SURGERY    . CYSTOCELE REPAIR N/A 01/08/2013   Procedure: ANTERIOR REPAIR (CYSTOCELE);  Surgeon: Meriel Pica, MD;  Location: [facility];  Service: Gynecology;  Laterality: N/A;  . NASAL SINUS SURGERY    . VAGINAL HYSTERECTOMY     Family History  Problem Relation Age of Onset  . Heart disease Mother   . Peptic Ulcer Disease Mother   . Heart disease Father   . Parkinson's disease Father    Social History  Tobacco Use  . Smoking status: Never Smoker  . Smokeless tobacco: Never Used  Substance Use Topics  . Alcohol use: Yes    Comment: occassionally drinks a glass of wine  . Drug use: No    ROS Constitutional: Denies fever, chills, weight loss/gain, headaches, insomnia,  night sweats, and change in appetite. Does c/o fatigue. Eyes: Denies redness, blurred vision, diplopia, discharge, itchy, watery eyes.  ENT: Denies discharge, congestion, post nasal drip, epistaxis, sore throat, earache, hearing loss, dental pain, Tinnitus, Vertigo, Sinus pain, snoring.  Cardio: Denies chest pain, palpitations, irregular heartbeat, syncope, dyspnea, diaphoresis, orthopnea, PND, claudication, edema Respiratory: denies cough, dyspnea, DOE, pleurisy, hoarseness, laryngitis, wheezing.  Gastrointestinal: Denies dysphagia, heartburn, reflux, water brash, pain, cramps, nausea, vomiting, bloating, diarrhea, constipation, hematemesis, melena, hematochezia, jaundice, hemorrhoids Genitourinary: Denies dysuria, frequency, urgency, nocturia, hesitancy, discharge, hematuria, flank pain Breast: Breast lumps, nipple discharge, bleeding.  Musculoskeletal: Denies arthralgia, myalgia, stiffness, Jt. Swelling, pain, limp, and strain/sprain. Denies falls. Skin: Denies  puritis, rash, hives, warts, acne, eczema, changing in skin lesion Neuro: No weakness, tremor, incoordination, spasms, paresthesia, pain Psychiatric: Denies confusion, memory loss, sensory loss. Denies Depression. Endocrine: Denies change in weight, skin, hair change, nocturia, and paresthesia, diabetic polys, visual blurring, hyper / hypo glycemic episodes.  Heme/Lymph: No excessive bleeding, bruising, enlarged lymph nodes.  Physical Exam  BP 110/80   Pulse 88   Temp (!) 97 F (36.1 C)   Resp 16   Ht 5' 8.5" (1.74 m)   Wt 162 lb 3.2 oz (73.6 kg)   BMI 24.30 kg/m   General Appearance: Well nourished, well groomed and in no apparent distress.  Eyes: PERRLA, EOMs, conjunctiva no swelling or erythema, normal fundi and vessels. Sinuses: No frontal/maxillary tenderness ENT/Mouth: EACs patent / TMs  nl. Nares clear without erythema, swelling, mucoid exudates. Oral hygiene is good. No erythema, swelling, or exudate. Tongue normal, non-obstructing. Tonsils not swollen or erythematous. Hearing normal.  Neck: Supple, thyroid not palpable. No bruits, nodes or JVD. Respiratory: Respiratory effort normal.  BS equal and clear bilateral without rales, rhonci, wheezing or stridor. Cardio: Heart sounds are normal with regular rate and rhythm and no murmurs, rubs or gallops. Peripheral pulses are normal and equal bilaterally without edema. No aortic or femoral bruits. Chest: symmetric with normal excursions and percussion. Breasts: Symmetric, without lumps, nipple discharge, retractions, or fibrocystic changes.  Abdomen: Flat, soft with bowel sounds active. Nontender, no guarding, rebound, hernias, masses, or organomegaly.  Lymphatics: Non tender without lymphadenopathy.  Genitourinary:  Musculoskeletal: Full ROM all peripheral extremities, joint stability, 5/5 strength, and normal gait. Skin: Warm and dry without rashes, lesions, cyanosis, clubbing or  ecchymosis.  Neuro: Cranial nerves intact. Mild  Lt hyperreflexia and increased L side tone Upper>Lower. No cerebellar symptoms. Sensation intact.  Pysch: Alert and oriented X 3, normal affect, Insight and Judgment appropriate.   Assessment and Plan  1. Annual Preventative Screening Examination  2. Labile hypertension  - EKG 12-Lead - [place], RETROPERITNL ABD,  LTD - Urinalysis, Routine w reflex microscopic - Microalbumin / creatinine urine ratio - CBC with Differential/Platelet - COMPLETE METABOLIC PANEL WITH GFR - Magnesium - TSH  3. Hyperlipidemia, mixed  - EKG 12-Lead - [place], RETROPERITNL ABD,  LTD - Lipid panel - TSH  4. Abnormal glucose  - EKG 12-Lead - [place], RETROPERITNL ABD,  LTD - Hemoglobin A1c - Insulin, random  5. Vitamin D deficiency  - VITAMIN D 25 Hydroxy  6. Acquired hypothyroidism  - TSH  7. Prediabetes  -  EKG 12-Lead - [place], RETROPERITNL ABD,  LTD  8. Spastic hemiplegia of left nondominant side as late effect of cerebrovascular disease ([place])   9. Chronic migraine   10. Screening for colorectal cancer  - POC Hemoccult Bld/Stl   11. ADD (attention deficit disorder) without hyperactivity   12. Screening for ischemic heart disease   13. Screening for AAA (aortic abdominal aneurysm)   14. FHx: heart disease   15. Fatigue  - Iron,Total/Total Iron Binding Cap - Vitamin B12 - CBC with Differential/Platelet - TSH  16. Medication management  - Urinalysis, Routine w reflex microscopic - Microalbumin / creatinine urine ratio - Uric acid - CBC with Differential/Platelet - COMPLETE METABOLIC PANEL WITH GFR - Magnesium - Lipid panel - TSH - Hemoglobin A1c - Insulin, random - VITAMIN D 25 Hydroxy         Patient was counseled in prudent diet to achieve/maintain BMI less than 25 for weight control, BP monitoring, regular exercise and medications. Discussed med's effects and SE's. Screening labs and tests as requested with regular follow-up as recommended. Over 40 minutes of exam,  counseling, chart review and high complex critical decision making was performed.   Kirtland Bouchard, MD

## 2019-09-06 ENCOUNTER — Ambulatory Visit (INDEPENDENT_AMBULATORY_CARE_PROVIDER_SITE_OTHER): Payer: BC Managed Care – PPO | Admitting: Internal Medicine

## 2019-09-06 ENCOUNTER — Other Ambulatory Visit: Payer: Self-pay

## 2019-09-06 VITALS — BP 110/80 | HR 88 | Temp 97.0°F | Resp 16 | Ht 68.5 in | Wt 162.2 lb

## 2019-09-06 DIAGNOSIS — Z1329 Encounter for screening for other suspected endocrine disorder: Secondary | ICD-10-CM | POA: Diagnosis not present

## 2019-09-06 DIAGNOSIS — R5383 Other fatigue: Secondary | ICD-10-CM

## 2019-09-06 DIAGNOSIS — Z130 Encounter for screening for diseases of the blood and blood-forming organs and certain disorders involving the immune mechanism: Secondary | ICD-10-CM | POA: Diagnosis not present

## 2019-09-06 DIAGNOSIS — R7303 Prediabetes: Secondary | ICD-10-CM

## 2019-09-06 DIAGNOSIS — F988 Other specified behavioral and emotional disorders with onset usually occurring in childhood and adolescence: Secondary | ICD-10-CM

## 2019-09-06 DIAGNOSIS — Z0000 Encounter for general adult medical examination without abnormal findings: Secondary | ICD-10-CM | POA: Diagnosis not present

## 2019-09-06 DIAGNOSIS — IMO0002 Reserved for concepts with insufficient information to code with codable children: Secondary | ICD-10-CM

## 2019-09-06 DIAGNOSIS — I10 Essential (primary) hypertension: Secondary | ICD-10-CM

## 2019-09-06 DIAGNOSIS — Z79899 Other long term (current) drug therapy: Secondary | ICD-10-CM

## 2019-09-06 DIAGNOSIS — Z13220 Encounter for screening for lipoid disorders: Secondary | ICD-10-CM | POA: Diagnosis not present

## 2019-09-06 DIAGNOSIS — E559 Vitamin D deficiency, unspecified: Secondary | ICD-10-CM

## 2019-09-06 DIAGNOSIS — Z1212 Encounter for screening for malignant neoplasm of rectum: Secondary | ICD-10-CM

## 2019-09-06 DIAGNOSIS — Z8249 Family history of ischemic heart disease and other diseases of the circulatory system: Secondary | ICD-10-CM

## 2019-09-06 DIAGNOSIS — Z87898 Personal history of other specified conditions: Secondary | ICD-10-CM | POA: Diagnosis not present

## 2019-09-06 DIAGNOSIS — R0989 Other specified symptoms and signs involving the circulatory and respiratory systems: Secondary | ICD-10-CM

## 2019-09-06 DIAGNOSIS — I69954 Hemiplegia and hemiparesis following unspecified cerebrovascular disease affecting left non-dominant side: Secondary | ICD-10-CM

## 2019-09-06 DIAGNOSIS — Z8673 Personal history of transient ischemic attack (TIA), and cerebral infarction without residual deficits: Secondary | ICD-10-CM

## 2019-09-06 DIAGNOSIS — Z1211 Encounter for screening for malignant neoplasm of colon: Secondary | ICD-10-CM

## 2019-09-06 DIAGNOSIS — Z131 Encounter for screening for diabetes mellitus: Secondary | ICD-10-CM

## 2019-09-06 DIAGNOSIS — M542 Cervicalgia: Secondary | ICD-10-CM | POA: Diagnosis not present

## 2019-09-06 DIAGNOSIS — E039 Hypothyroidism, unspecified: Secondary | ICD-10-CM

## 2019-09-06 DIAGNOSIS — R7309 Other abnormal glucose: Secondary | ICD-10-CM

## 2019-09-06 DIAGNOSIS — Z136 Encounter for screening for cardiovascular disorders: Secondary | ICD-10-CM

## 2019-09-06 DIAGNOSIS — Z0001 Encounter for general adult medical examination with abnormal findings: Secondary | ICD-10-CM

## 2019-09-06 DIAGNOSIS — E782 Mixed hyperlipidemia: Secondary | ICD-10-CM

## 2019-09-07 LAB — LIPID PANEL
Cholesterol: 159 mg/dL (ref <200)
HDL: 60 mg/dL (ref >=50)
LDL Cholesterol (Calc): 73 mg/dL (calc)
Non-HDL Cholesterol (Calc): 99 mg/dL (calc) (ref <130)
Total CHOL/HDL Ratio: 2.7 (calc) (ref <5.0)
Triglycerides: 189 mg/dL — ABNORMAL HIGH (ref <150)

## 2019-09-07 LAB — COMPLETE METABOLIC PANEL WITH GFR
AG Ratio: 2.2 (calc) (ref 1.0–2.5)
ALT: 27 U/L (ref 6–29)
AST: 20 U/L (ref 10–35)
Albumin: 4.8 g/dL (ref 3.6–5.1)
Alkaline phosphatase (APISO): 87 U/L (ref 37–153)
BUN: 15 mg/dL (ref 7–25)
CO2: 23 mmol/L (ref 20–32)
Calcium: 9.9 mg/dL (ref 8.6–10.4)
Chloride: 109 mmol/L (ref 98–110)
Creat: 0.93 mg/dL (ref 0.50–0.99)
GFR, Est African American: 77 mL/min/{1.73_m2} (ref >=60)
GFR, Est Non African American: 67 mL/min/{1.73_m2} (ref >=60)
Globulin: 2.2 g/dL (calc) (ref 1.9–3.7)
Glucose, Bld: 89 mg/dL (ref 65–99)
Potassium: 4 mmol/L (ref 3.5–5.3)
Sodium: 140 mmol/L (ref 135–146)
Total Bilirubin: 0.6 mg/dL (ref 0.2–1.2)
Total Protein: 7 g/dL (ref 6.1–8.1)

## 2019-09-07 LAB — VITAMIN D 25 HYDROXY (VIT D DEFICIENCY, FRACTURES): Vit D, 25-Hydroxy: 76 ng/mL (ref 30–100)

## 2019-09-07 LAB — CBC WITH DIFFERENTIAL/PLATELET
Absolute Monocytes: 231 cells/uL (ref 200–950)
Basophils Absolute: 29 cells/uL (ref 0–200)
Basophils Relative: 0.7 %
Eosinophils Absolute: 92 cells/uL (ref 15–500)
Eosinophils Relative: 2.2 %
HCT: 44.8 % (ref 35.0–45.0)
Hemoglobin: 15.4 g/dL (ref 11.7–15.5)
Lymphs Abs: 1415 cells/uL (ref 850–3900)
MCH: 32.5 pg (ref 27.0–33.0)
MCHC: 34.4 g/dL (ref 32.0–36.0)
MCV: 94.5 fL (ref 80.0–100.0)
MPV: 11.4 fL (ref 7.5–12.5)
Monocytes Relative: 5.5 %
Neutro Abs: 2432 cells/uL (ref 1500–7800)
Neutrophils Relative %: 57.9 %
Platelets: 183 10*3/uL (ref 140–400)
RBC: 4.74 10*6/uL (ref 3.80–5.10)
RDW: 12.6 % (ref 11.0–15.0)
Total Lymphocyte: 33.7 %
WBC: 4.2 10*3/uL (ref 3.8–10.8)

## 2019-09-07 LAB — TSH: TSH: 0.49 mIU/L (ref 0.40–4.50)

## 2019-09-07 LAB — INSULIN, RANDOM: Insulin: 13.1 u[IU]/mL

## 2019-09-07 LAB — MAGNESIUM: Magnesium: 2.1 mg/dL (ref 1.5–2.5)

## 2019-09-07 LAB — HEMOGLOBIN A1C
Hgb A1c MFr Bld: 5.4 % of total Hgb (ref <5.7)
Mean Plasma Glucose: 108 (calc)
eAG (mmol/L): 6 (calc)

## 2019-09-07 LAB — URIC ACID: Uric Acid, Serum: 6.1 mg/dL (ref 2.5–7.0)

## 2019-09-07 LAB — IRON, TOTAL/TOTAL IRON BINDING CAP
%SAT: 43 % (calc) (ref 16–45)
Iron: 136 ug/dL (ref 45–160)
TIBC: 319 mcg/dL (calc) (ref 250–450)

## 2019-09-07 LAB — VITAMIN B12: Vitamin B-12: 460 pg/mL (ref 200–1100)

## 2019-09-07 NOTE — Progress Notes (Signed)
==========================================================  -   Iron levels & Vitamin B12 levels are both Normal  ==========================================================  - Total Chol = 159 and LDL 53 - Both Excellent   - Very low risk for Heart Attack  / Stroke ==========================================================  - A1c - Normal - Great -No Diabetes ==========================================================  - Vitamin D = 76 - Great - Recommend re-start   taking Vitamin D 5,000 units Daily  ==========================================================  - All Else - CBC - Kidneys - Electrolytes -  Liver - Magnesium & Thyroid  - all  Normal / OK ==========================================================

## 2019-09-08 ENCOUNTER — Encounter: Payer: Self-pay | Admitting: Internal Medicine

## 2019-09-13 DIAGNOSIS — M542 Cervicalgia: Secondary | ICD-10-CM | POA: Diagnosis not present

## 2019-09-21 ENCOUNTER — Other Ambulatory Visit: Payer: Self-pay | Admitting: Physician Assistant

## 2019-09-21 DIAGNOSIS — E039 Hypothyroidism, unspecified: Secondary | ICD-10-CM

## 2019-09-26 ENCOUNTER — Ambulatory Visit (INDEPENDENT_AMBULATORY_CARE_PROVIDER_SITE_OTHER): Payer: BC Managed Care – PPO | Admitting: Neurology

## 2019-09-26 ENCOUNTER — Encounter: Payer: Self-pay | Admitting: Neurology

## 2019-09-26 ENCOUNTER — Other Ambulatory Visit: Payer: Self-pay | Admitting: *Deleted

## 2019-09-26 ENCOUNTER — Telehealth: Payer: Self-pay | Admitting: Neurology

## 2019-09-26 VITALS — BP 112/72 | HR 88 | Ht 68.5 in

## 2019-09-26 DIAGNOSIS — G43709 Chronic migraine without aura, not intractable, without status migrainosus: Secondary | ICD-10-CM | POA: Diagnosis not present

## 2019-09-26 DIAGNOSIS — I69954 Hemiplegia and hemiparesis following unspecified cerebrovascular disease affecting left non-dominant side: Secondary | ICD-10-CM | POA: Diagnosis not present

## 2019-09-26 DIAGNOSIS — IMO0002 Reserved for concepts with insufficient information to code with codable children: Secondary | ICD-10-CM

## 2019-09-26 NOTE — Telephone Encounter (Signed)
I filled out enrollment form for [facility] so we can start having them deliver Botox for her injections. Patient has injection today and her next injection will be 12/26/19. MD has signed enrollment form and I faxed it to [phone]). I will let patient know.

## 2019-09-26 NOTE — Progress Notes (Signed)
**  Botox 100 units x 4 vials, NDC 4193-7902-40, Lot [id number], Exp 12/2021, office supply.//mck,rn**

## 2019-09-27 ENCOUNTER — Other Ambulatory Visit: Payer: Self-pay

## 2019-09-27 ENCOUNTER — Encounter: Payer: Self-pay | Admitting: Family Medicine

## 2019-09-27 ENCOUNTER — Ambulatory Visit: Payer: Self-pay

## 2019-09-27 ENCOUNTER — Ambulatory Visit (INDEPENDENT_AMBULATORY_CARE_PROVIDER_SITE_OTHER): Payer: BC Managed Care – PPO | Admitting: Family Medicine

## 2019-09-27 DIAGNOSIS — M25551 Pain in right hip: Secondary | ICD-10-CM

## 2019-09-27 MED ORDER — DICLOFENAC SODIUM 1 % EX GEL
4.0000 g | Freq: Four times a day (QID) | CUTANEOUS | 6 refills | Status: DC | PRN
Start: 2019-09-27 — End: 2020-04-09

## 2019-09-27 MED ORDER — BUTALBITAL-APAP-CAFFEINE 50-325-40 MG PO TABS: 1.0000 | ORAL_TABLET | Freq: Every day | ORAL | 5 refills | Status: DC | PRN

## 2019-09-27 NOTE — Telephone Encounter (Signed)
Noted. Will initiate PA

## 2019-09-27 NOTE — Telephone Encounter (Signed)
[facility] called to let us know that pt will need a PA for botox. Please call pt's plan at [phone]  Pt's member ID: [id number]  call [phone] opt [phone]- for any questions for [facility]

## 2019-09-27 NOTE — Progress Notes (Signed)
Office Visit Note   Patient: Ruth Gregory           Date of Birth: 07/17/1960           MRN: [id number] Visit Date: 09/27/2019 Requested by: Lucky Cowboy, MD [address] [place],  [place] [address] PCP: Lucky Cowboy, MD  Subjective: Chief Complaint  Patient presents with  . Right Hip - Pain    Pain upper lateral hip since early March this year. Is a side-sleeper. Worsens with lying on that side and with driving.     HPI: She is here with right hip pain.  Symptoms started about 3 months ago, no definite injury.  She began noticing pain on the lateral aspect of her hip wincing pain driving in her car or when sleeping on her side.  Pain does not seem to be going away.  Now her left hip is starting to hurt a little bit from favoring the right.  She is not taking anything for her pain.  Incidentally her neck pain resolved quickly with physical therapy.  She is very pleased with her results.              ROS:   All other systems were reviewed and are negative.  Objective: Vital Signs: There were no vitals taken for this visit.  Physical Exam:  General:  Alert and oriented, in no acute distress. Pulm:  Breathing unlabored. Psy:  Normal mood, congruent affect. Skin: No visible rash Right hip: She has very good bilateral flexion and internal/external rotation range of motion with no symptoms if it pain.  She is tender near the tensor fascia lata muscle and over the greater Trochanter and this seems to reproduce her pain pain.  She has relative weakness with hip abduction against resistance.  Piriformis stretch is negative.  Imaging: XR HIP UNILAT W OR W/O PELVIS 2-3 VIEWS RIGHT  Result Date: 09/27/2019 X-rays of the right hip reveal no sign of stress fracture, no significant arthritic change, well-preserved joint space.  No sign of neoplasm.   Assessment & Plan: 1.  Right lateral hip pain, suspect IT band syndrome with greater trochanter  syndrome -Referral to physical therapy at [facility].  Voltaren gel topically.  Follow-up as needed.     Procedures: No procedures performed  No notes on file     PMFS History: Patient Active Problem List   Diagnosis Date Noted  . Spastic hemiplegia of left nondominant side as late effect of cerebrovascular disease (HCC) 06/18/2019  . Abnormal glucose 07/27/2018  . Labile hypertension 07/27/2018  . Fatty liver 10/09/2017  . GERD (gastroesophageal reflux disease) 10/07/2017  . Anxiety 10/07/2017  . Hypothyroidism 07/23/2015  . Vitamin D deficiency 10/03/2013  . Medication management 10/03/2013  . History of prediabetes 10/03/2013  . ADD (attention deficit disorder) without hyperactivity 10/03/2013  . Hyperlipidemia, mixed 04/04/2013  . Sinusitis, chronic 04/04/2013  . Chronic migraine 09/06/2012  . Spastic hemiplegia affecting left nondominant side (HCC) 09/06/2012  . History of CVA (cerebrovascular accident)    Past Medical History:  Diagnosis Date  . Anemia   . Anxiety   . Asthma   . Chronic heartburn   . Depression   . Elevated cholesterol    on Crestor  . Headache(784.0)   . Hypertension   . Hypothyroidism    S/P thyroidectomy  . Migraine   . Occlusion and stenosis of carotid artery with cerebral infarction   . Spasm of muscle   .  Unspecified cerebral artery occlusion with cerebral infarction   . Vitamin D deficiency     Family History  Problem Relation Age of Onset  . Heart disease Mother   . Peptic Ulcer Disease Mother   . Heart disease Father   . Parkinson's disease Father     Past Surgical History:  Procedure Laterality Date  . AUGMENTATION MAMMAPLASTY Bilateral 2000  . COSMETIC SURGERY    . CYSTOCELE REPAIR N/A 01/08/2013   Procedure: ANTERIOR REPAIR (CYSTOCELE);  Surgeon: Meriel Pica, MD;  Location: [facility];  Service: Gynecology;  Laterality: N/A;  . NASAL SINUS SURGERY    . VAGINAL HYSTERECTOMY     Social History   Occupational  History  . Occupation: DESIGNER    Employer: [employer]  Tobacco Use  . Smoking status: Never Smoker  . Smokeless tobacco: Never Used  Substance and Sexual Activity  . Alcohol use: Yes    Comment: occassionally drinks a glass of wine  . Drug use: No  . Sexual activity: Not on file

## 2019-10-02 NOTE — Telephone Encounter (Signed)
I called [facility] and spoke with Kayla. I gave her the PA on file with [employer]. [id number] and [id number] valid from 03/14/19 to 03/12/20. PA #[id number]. She states she will put it in the system and [facility] will call to schedule delivery when ready.

## 2019-10-04 ENCOUNTER — Ambulatory Visit (INDEPENDENT_AMBULATORY_CARE_PROVIDER_SITE_OTHER): Payer: BC Managed Care – PPO | Admitting: Adult Health

## 2019-10-04 ENCOUNTER — Other Ambulatory Visit: Payer: Self-pay

## 2019-10-04 ENCOUNTER — Encounter: Payer: Self-pay | Admitting: Adult Health

## 2019-10-04 VITALS — BP 120/80 | HR 90 | Temp 97.3°F | Ht 68.5 in | Wt 162.0 lb

## 2019-10-04 DIAGNOSIS — R350 Frequency of micturition: Secondary | ICD-10-CM | POA: Diagnosis not present

## 2019-10-04 DIAGNOSIS — N390 Urinary tract infection, site not specified: Secondary | ICD-10-CM | POA: Diagnosis not present

## 2019-10-04 DIAGNOSIS — F988 Other specified behavioral and emotional disorders with onset usually occurring in childhood and adolescence: Secondary | ICD-10-CM

## 2019-10-04 DIAGNOSIS — R300 Dysuria: Secondary | ICD-10-CM

## 2019-10-04 MED ORDER — NITROFURANTOIN MONOHYD MACRO 100 MG PO CAPS: 100.0000 mg | ORAL_CAPSULE | Freq: Two times a day (BID) | ORAL | 0 refills | Status: DC

## 2019-10-04 NOTE — Progress Notes (Signed)
Assessment and Plan:  Aspyn was seen today for urinary tract infection.  Diagnoses and all orders for this visit:  Dysuria Urinary frequency UTI versus OAB versus vaginal dryness Avoid bath products, harsh hygiene products - will check UA, C&S. Will start ABX now due to pain -     Urinalysis w microscopic + reflex cultur -     nitrofurantoin, macrocrystal-monohydrate, (MACROBID) 100 MG capsule; Take 1 capsule (100 mg total) by mouth 2 (two) times daily.  Further disposition pending results of labs. Discussed med's effects and SE's.   Over 30 minutes of exam, counseling, chart review, and critical decision making was performed.   Future Appointments  Date Time Provider Department Center  12/13/2019 10:30 AM Quentin Mulling, PA-C [facility] None  12/26/2019  2:00 PM Levert Feinstein, MD [facility] None  03/14/2020 10:30 AM Lucky Cowboy, MD [facility] None  09/08/2020 11:00 AM Lucky Cowboy, MD [facility] None    ------------------------------------------------------------------------------------------------------------------   HPI BP 120/80   Pulse 90   Temp (!) 97.3 F (36.3 C)   Ht 5' 8.5" (1.74 m)   Wt 162 lb (73.5 kg)   SpO2 99%   BMI 24.27 kg/m   59 y.o.female presents for evaluation due to 6 days ago with urinary frequency and dysuria, start taking azo with some improvement but persistent progressive over last few days, increasing pressure. Increased burning when azo runs out. She reports urine is darker than usual, dark gold color, denies cloudiness or odor, no hematuria. Endorses lower abdominal pressure. Denies flank/back pain, no fever/chills. She denies vaginal discharge. No hx of STIs, no new sexual partners. Did take a long bath with bubbles the day prior to onset.   She has hx of bladder tack, on very low dose vaginal estrogen by GYN.   Past Medical History:  Diagnosis Date  . Anemia   . Anxiety   . Asthma   . Chronic heartburn   . Depression   . Elevated  cholesterol    on Crestor  . Headache(784.0)   . Hypertension   . Hypothyroidism    S/P thyroidectomy  . Migraine   . Occlusion and stenosis of carotid artery with cerebral infarction   . Spasm of muscle   . Unspecified cerebral artery occlusion with cerebral infarction   . Vitamin D deficiency      Allergies  Allergen Reactions  . Penicillins Anaphylaxis and Hives  . Imitrex [Sumatriptan]   . Other Swelling    kiwi  . Sulfa Antibiotics Hives  . Zomig [Zolmitriptan]     Current Outpatient Medications on File Prior to Visit  Medication Sig  . albuterol (PROVENTIL HFA) 108 (90 Base) MCG/ACT inhaler 1 to 2 inhalations 10-15 minutes apart every 4 hours if needed for asthma rescue  . ALPRAZolam (XANAX) 1 MG tablet Take 1/2-1 tablet at Bedtime ONLY if needed for Sleep  &  limit to 5 days /week to avoid Addiction & Dementia  . aspirin EC 81 MG tablet Take 81 mg by mouth daily.  Marland Kitchen augmented betamethasone dipropionate (DIPROLENE-AF) 0.05 % cream Apply topically 2 (two) times daily as needed.  . Botulinum Toxin Type A (BOTOX) 200 UNITS SOLR Inject 400 Units as directed every 3 (three) months. Change to BOTOX A 100  units  . BREO ELLIPTA 100-25 MCG/INH AEPB 1 INHALATION DAILY TO PREVENT ASTHMA  . buPROPion (WELLBUTRIN XL) 300 MG 24 hr tablet Take 1 tablet every Morning for Mood. Focus & Concentration  . butalbital-acetaminophen-caffeine (FIORICET) 50-325-40 MG tablet Take  1 tablet by mouth daily as needed for headache. Do not refill in less than 30 days,  . cetirizine (ZYRTEC) 10 MG tablet Take 10 mg by mouth daily.  . Diclofenac Potassium,Migraine, (CAMBIA) 50 MG PACK MIX 1 PACK WITH 1-2 OUNCES OF WATER AND DRINK AT ONSET OF MIGRAINE CAN REPEAT IN 2 HOURS IF NEEDED MAX 2/24 HOURS  . diclofenac Sodium (VOLTAREN) 1 % GEL Apply 4 g topically 4 (four) times daily as needed.  . Estradiol (YUVAFEM) 10 MCG TABS vaginal tablet Place 10 mcg vaginally 2 (two) times a week.  . fenofibrate micronized  (LOFIBRA) 134 MG capsule TAKE 1 CAPSULE EVERY DAY  . levothyroxine (SYNTHROID) 88 MCG tablet Take 1 tablet daily on an empty stomach with only water for 30 minutes & no Antacid meds, Calcium or Magnesium for 4 hours & avoid Biotin  . magnesium oxide (MAG-OX) 400 MG tablet Take 400 mg by mouth 2 (two) times daily.   . montelukast (SINGULAIR) 10 MG tablet Take 1 tablet Daily  for Allergies.  . Multiple Vitamin (MULTIVITAMIN WITH MINERALS) TABS tablet Take 1 tablet by mouth daily.  . Multiple Vitamins-Minerals (ZINC PO) Take 1 tablet by mouth daily.  . nortriptyline (PAMELOR) 25 MG capsule Take 25 mg by mouth at bedtime.  Marland Kitchen omeprazole (PRILOSEC) 40 MG capsule TAKE 1 CAPSULE DAILY FOR ACID INDIGESTION & REFLUX  . ondansetron (ZOFRAN ODT) 8 MG disintegrating tablet Dissolve 1 tablet under tongue every 6 to 8 hours for nausea  or vomitting  . predniSONE (DELTASONE) 20 MG tablet 1 TAB 3 TIMES A DAY FOR 3 DAYS, THEN 1 TAB 2 TIMES A DAY FOR 3 DAYS, THEN 1 TAB 1 TIME A DAY FOR 5 DAYS  . Riboflavin 100 MG TABS Take 100 mg by mouth. bid  . rosuvastatin (CRESTOR) 40 MG tablet Take 1 tablet (40 mg total) by mouth daily. Take 1 tablet Daily for Cholesterol  . topiramate (TOPAMAX) 100 MG tablet Take 2 tablets (200 mg total) by mouth daily.  . verapamil (CALAN-SR) 240 MG CR tablet Take 1 tablet Daily with Food for BP & Migraine Prevention  . vitamin B-12 (CYANOCOBALAMIN) 1000 MCG tablet Take 1,000 mcg by mouth daily.  . Vitamin D, Ergocalciferol, (DRISDOL) 1.25 MG (50000 UT) CAPS capsule Take 1 capsule daily for Vitamin D deficiency.  Marland Kitchen amphetamine-dextroamphetamine (ADDERALL) 10 MG tablet Take 1/2 to 1 tablet 1 or 2 x daily for ADD   No current facility-administered medications on file prior to visit.    ROS: all negative except above.   Physical Exam:  BP 120/80   Pulse 90   Temp (!) 97.3 F (36.3 C)   Ht 5' 8.5" (1.74 m)   Wt 162 lb (73.5 kg)   SpO2 99%   BMI 24.27 kg/m   General Appearance:  Well nourished, in no apparent distress. Eyes: conjunctiva no swelling or erythema ENT/Mouth: mask in place; Hearing normal.  Neck: Supple Respiratory: Respiratory effort normal, BS equal bilaterally without rales, rhonchi, wheezing or stridor.  Cardio: RRR with no MRGs. Brisk peripheral pulses without edema.  Abdomen: Soft, + BS.  Non tender, no guarding, rebound, hernias, masses. No CVA tenderness.  Lymphatics: Non tender without lymphadenopathy.  Musculoskeletal:  normal gait.  Skin: Warm, dry without rashes, lesions, ecchymosis.  Neuro: Normal muscle tone, Sensation intact.  Psych: Awake and oriented X 3, normal affect, Insight and Judgment appropriate.    Dan Maker, NP 2:33 PM [facility]

## 2019-10-04 NOTE — Patient Instructions (Signed)
Urinary Tract Infection, Adult A urinary tract infection (UTI) is an infection of any part of the urinary tract. The urinary tract includes:  The kidneys.  The ureters.  The bladder.  The urethra. These organs make, store, and get rid of pee (urine) in the body. What are the causes? This is caused by germs (bacteria) in your genital area. These germs grow and cause swelling (inflammation) of your urinary tract. What increases the risk? You are more likely to develop this condition if:  You have a small, thin tube (catheter) to drain pee.  You cannot control when you pee or poop (incontinence).  You are female, and: ? You use these methods to prevent pregnancy:  A medicine that kills sperm (spermicide).  A device that blocks sperm (diaphragm). ? You have low levels of a female hormone (estrogen). ? You are pregnant.  You have genes that add to your risk.  You are sexually active.  You take antibiotic medicines.  You have trouble peeing because of: ? A prostate that is bigger than normal, if you are female. ? A blockage in the part of your body that drains pee from the bladder (urethra). ? A kidney stone. ? A nerve condition that affects your bladder (neurogenic bladder). ? Not getting enough to drink. ? Not peeing often enough.  You have other conditions, such as: ? Diabetes. ? A weak disease-fighting system (immune system). ? Sickle cell disease. ? Gout. ? Injury of the spine. What are the signs or symptoms? Symptoms of this condition include:  Needing to pee right away (urgently).  Peeing often.  Peeing small amounts often.  Pain or burning when peeing.  Blood in the pee.  Pee that smells bad or not like normal.  Trouble peeing.  Pee that is cloudy.  Fluid coming from the vagina, if you are female.  Pain in the belly or lower back. Other symptoms include:  Throwing up (vomiting).  No urge to eat.  Feeling mixed up (confused).  Being tired  and grouchy (irritable).  A fever.  Watery poop (diarrhea). How is this treated? This condition may be treated with:  Antibiotic medicine.  Other medicines.  Drinking enough water. Follow these instructions at home:  Medicines  Take over-the-counter and prescription medicines only as told by your doctor.  If you were prescribed an antibiotic medicine, take it as told by your doctor. Do not stop taking it even if you start to feel better. General instructions  Make sure you: ? Pee until your bladder is empty. ? Do not hold pee for a long time. ? Empty your bladder after sex. ? Wipe from front to back after pooping if you are a female. Use each tissue one time when you wipe.  Drink enough fluid to keep your pee pale yellow.  Keep all follow-up visits as told by your doctor. This is important. Contact a doctor if:  You do not get better after 1-2 days.  Your symptoms go away and then come back. Get help right away if:  You have very bad back pain.  You have very bad pain in your lower belly.  You have a fever.  You are sick to your stomach (nauseous).  You are throwing up. Summary  A urinary tract infection (UTI) is an infection of any part of the urinary tract.  This condition is caused by germs in your genital area.  There are many risk factors for a UTI. These include having a small, thin   tube to drain pee and not being able to control when you pee or poop.  Treatment includes antibiotic medicines for germs.  Drink enough fluid to keep your pee pale yellow. This information is not intended to replace advice given to you by your health care provider. Make sure you discuss any questions you have with your health care provider. Document Revised: 03/02/2018 Document Reviewed: 09/22/2017 Elsevier Patient Education  2020 Elsevier Inc. Nitrofurantoin tablets or capsules What is this medicine? NITROFURANTOIN (nye troe fyoor AN toyn) is an antibiotic. It is used  to treat urinary tract infections. This medicine may be used for other purposes; ask your health care provider or pharmacist if you have questions. COMMON BRAND NAME(S): Macrobid, Macrodantin, Urotoin What should I tell my health care provider before I take this medicine? They need to know if you have any of these conditions:  anemia  diabetes  glucose-6-phosphate dehydrogenase deficiency  kidney disease  liver disease  lung disease  other chronic illness  an unusual or allergic reaction to nitrofurantoin, other antibiotics, other medicines, foods, dyes or preservatives  pregnant or trying to get pregnant  breast-feeding How should I use this medicine? Take this medicine by mouth with a glass of water. Follow the directions on the prescription label. Take this medicine with food or milk. Take your doses at regular intervals. Do not take your medicine more often than directed. Do not stop taking except on your doctor's advice. Talk to your pediatrician regarding the use of this medicine in children. While this drug may be prescribed for selected conditions, precautions do apply. Overdosage: If you think you have taken too much of this medicine contact a poison control center or emergency room at once. NOTE: This medicine is only for you. Do not share this medicine with others. What if I miss a dose? If you miss a dose, take it as soon as you can. If it is almost time for your next dose, take only that dose. Do not take double or extra doses. What may interact with this medicine?  antacids containing magnesium trisilicate  probenecid  quinolone antibiotics like ciprofloxacin, lomefloxacin, norfloxacin and ofloxacin  sulfinpyrazone This list may not describe all possible interactions. Give your health care provider a list of all the medicines, herbs, non-prescription drugs, or dietary supplements you use. Also tell them if you smoke, drink alcohol, or use illegal drugs. Some  items may interact with your medicine. What should I watch for while using this medicine? Tell your doctor or health care professional if your symptoms do not improve or if you get new symptoms. Drink several glasses of water a day. If you are taking this medicine for a long time, visit your doctor for regular checks on your progress. If you are diabetic, you may get a false positive result for sugar in your urine with certain brands of urine tests. Check with your doctor. What side effects may I notice from receiving this medicine? Side effects that you should report to your doctor or health care professional as soon as possible:  allergic reactions like skin rash or hives, swelling of the face, lips, or tongue  chest pain  cough  difficulty breathing  dizziness, drowsiness  fever or infection  joint aches or pains  pale or blue-tinted skin  redness, blistering, peeling or loosening of the skin, including inside the mouth  tingling, burning, pain, or numbness in hands or feet  unusual bleeding or bruising  unusually weak or tired  yellowing   of eyes or skin Side effects that usually do not require medical attention (report to your doctor or health care professional if they continue or are bothersome):  dark urine  diarrhea  headache  loss of appetite  nausea or vomiting  temporary hair loss This list may not describe all possible side effects. Call your doctor for medical advice about side effects. You may report side effects to FDA at 1-800-FDA-1088. Where should I keep my medicine? Keep out of the reach of children. Store at room temperature between 15 and 30 degrees C (59 and 86 degrees F). Protect from light. Throw away any unused medicine after the expiration date. NOTE: This sheet is a summary. It may not cover all possible information. If you have questions about this medicine, talk to your doctor, pharmacist, or health care provider.  2020 Elsevier/Gold  Standard (2007-10-04 15:56:47)  

## 2019-10-04 NOTE — Telephone Encounter (Signed)
Patient forgot to request a refill for her Adderall when she was seen today. In que, for your review.

## 2019-10-05 DIAGNOSIS — I69954 Hemiplegia and hemiparesis following unspecified cerebrovascular disease affecting left non-dominant side: Secondary | ICD-10-CM

## 2019-10-05 DIAGNOSIS — G43709 Chronic migraine without aura, not intractable, without status migrainosus: Secondary | ICD-10-CM | POA: Diagnosis not present

## 2019-10-05 MED ORDER — ONABOTULINUMTOXINA 100 UNITS IJ SOLR
400.0000 [IU] | Freq: Once | INTRAMUSCULAR | Status: AC
  Administered 2019-10-05: 400 [IU] via INTRAMUSCULAR

## 2019-10-05 MED ORDER — AMPHETAMINE-DEXTROAMPHETAMINE 10 MG PO TABS: ORAL_TABLET | ORAL | 0 refills | Status: DC
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
## 2019-10-06 LAB — URINE CULTURE
MICRO NUMBER:: 10683091
SPECIMEN QUALITY:: ADEQUATE

## 2019-10-06 LAB — URINALYSIS W MICROSCOPIC + REFLEX CULTURE
Bilirubin Urine: NEGATIVE
Glucose, UA: NEGATIVE
Hyaline Cast: NONE SEEN /LPF
Ketones, ur: NEGATIVE
Nitrites, Initial: POSITIVE — AB
Protein, ur: NEGATIVE
Specific Gravity, Urine: 1.012 (ref 1.001–1.03)
WBC, UA: >=60 /HPF — AB (ref 0–5)
pH: 6 (ref 5.0–8.0)

## 2019-10-06 LAB — CULTURE INDICATED

## 2019-10-14 ENCOUNTER — Other Ambulatory Visit: Payer: Self-pay | Admitting: Internal Medicine

## 2019-10-16 ENCOUNTER — Other Ambulatory Visit: Payer: Self-pay

## 2019-10-16 DIAGNOSIS — F419 Anxiety disorder, unspecified: Secondary | ICD-10-CM

## 2019-10-16 MED ORDER — ONDANSETRON 8 MG PO TBDP: ORAL_TABLET | ORAL | 0 refills | Status: DC

## 2019-10-16 MED ORDER — ALPRAZOLAM 1 MG PO TABS: ORAL_TABLET | ORAL | 0 refills | Status: DC

## 2019-11-05 ENCOUNTER — Other Ambulatory Visit: Payer: Self-pay | Admitting: Internal Medicine

## 2019-11-05 ENCOUNTER — Telehealth: Payer: Self-pay | Admitting: Neurology

## 2019-11-05 MED ORDER — DEXAMETHASONE 4 MG PO TABS: ORAL_TABLET | ORAL | 0 refills | Status: DC

## 2019-11-05 NOTE — Telephone Encounter (Signed)
Pt sent a my chart message in regards to treatment options for migraines.  I have fwd to Dr. Terrace Arabia to review.

## 2019-11-05 NOTE — Telephone Encounter (Signed)
Pt is needing to speak to RN or provider regarding a really bad migraine that she is experiencing at this moment and she is wanting to know if she can be treated today. Please advise.

## 2019-11-05 NOTE — Telephone Encounter (Signed)
I called the pt and advised Dr. Terrace Arabia reviewed her message and recommended she come in for migraine infusion Ok with iv infusion for intractable migraine  Depacon 500 mg x 2 Toradol 30 mg If she has a driver, may give her Compazine 10 mg   I called the pt and advised of the recommendation.  Pt stated she is not mentally prepared to come in for today's appt but would like for order to be kept because she may come in tomorrow if her migraine is not improved.  Pt understands to have a driver.

## 2019-11-05 NOTE — Telephone Encounter (Signed)
Ok with iv infusion for intractable migraine  Depacon 500 mg x 2 Toradol 30 mg If she has a driver, may give her Compazine 10 mg

## 2019-11-06 ENCOUNTER — Telehealth: Payer: Self-pay | Admitting: Neurology

## 2019-11-06 DIAGNOSIS — G43709 Chronic migraine without aura, not intractable, without status migrainosus: Secondary | ICD-10-CM | POA: Diagnosis not present

## 2019-11-06 NOTE — Telephone Encounter (Signed)
Pt called would like to let nurse to know she would like an Infusion appt today if possible. Would like a call back.

## 2019-11-06 NOTE — Telephone Encounter (Signed)
I reached out to Ruth Gregory with intrafusion and she sts the pt can receive migraine infusion today at 130 pm.  I called the pt and advised of time. Pt verbalized understanding and understands to have a driver for after the appt.

## 2019-11-08 DIAGNOSIS — R197 Diarrhea, unspecified: Secondary | ICD-10-CM | POA: Diagnosis not present

## 2019-11-08 DIAGNOSIS — Z01818 Encounter for other preprocedural examination: Secondary | ICD-10-CM | POA: Diagnosis not present

## 2019-11-08 DIAGNOSIS — R140 Abdominal distension (gaseous): Secondary | ICD-10-CM | POA: Diagnosis not present

## 2019-11-13 ENCOUNTER — Telehealth: Payer: Self-pay | Admitting: Family Medicine

## 2019-11-13 NOTE — Telephone Encounter (Signed)
I called and left voice mail message to call back with which location of [facility] she would like me to fax the order to, instead of her having to pick up a new one. Sent a message through MyChart to her, as well, regarding this.

## 2019-11-13 NOTE — Telephone Encounter (Signed)
Patient called.   She said she was given a paper order for PT and misplaced it. She needs another one.  Call back: [phone]

## 2019-11-15 ENCOUNTER — Other Ambulatory Visit: Payer: Self-pay

## 2019-11-15 ENCOUNTER — Telehealth: Payer: Self-pay | Admitting: Family Medicine

## 2019-11-15 DIAGNOSIS — M25551 Pain in right hip: Secondary | ICD-10-CM

## 2019-11-15 NOTE — Telephone Encounter (Signed)
Faxed

## 2019-11-15 NOTE — Telephone Encounter (Signed)
Referral faxed to [facility] at [facility], asking them to call her to schedule appointment.

## 2019-11-15 NOTE — Telephone Encounter (Signed)
Patient called. She would like to go to PT in [place]

## 2019-11-19 ENCOUNTER — Other Ambulatory Visit: Payer: Self-pay | Admitting: Internal Medicine

## 2019-11-22 DIAGNOSIS — H2513 Age-related nuclear cataract, bilateral: Secondary | ICD-10-CM | POA: Diagnosis not present

## 2019-11-22 DIAGNOSIS — H524 Presbyopia: Secondary | ICD-10-CM | POA: Diagnosis not present

## 2019-11-22 DIAGNOSIS — H18513 Endothelial corneal dystrophy, bilateral: Secondary | ICD-10-CM | POA: Diagnosis not present

## 2019-12-12 NOTE — Progress Notes (Signed)
3 Month Follow Up   Assessment and Plan:   Chronic migraine Managing well at this time, managed with Neurology and receiving Botox injections Q80months.  Spastic hemiplegia of left nondominant side as late effect of cerebrovascular disease, unspecified cerebrovascular disease type (HCC) Continue Botox injections, follow with Neurology, Dr Terrace Arabia for this. -     COMPLETE METABOLIC PANEL WITH GFR  Anxiety Doing well at this time Continue Alprazolam with benefit, but will cut down to 0.5 mg for her to try 1/2 as needed Will also cut back on wellbutrin from 300mg  to 150mg  for possible serotonin syndrome.  Stress management techniques discussed, increase water, good sleep hygiene discussed, increase exercise, and increase veggies.   Mixed hyperlipidemia Continue medications: rosuvastatin, fenofibrate  Continue low cholesterol diet and exercise.  Check lipid panel.  -     Lipid panel  Hypothyroidism, unspecified type -     TSH Reminder to take on an empty stomach 30-64mins before first meal of the day.  ADD (attention deficit disorder) without hyperactivity Doing well on current regiment Continue with benefit -Adderall 10mg  1/2 to 1 tablet BID  Vitamin D deficiency monitor  Other abnormal glucose Recent A1Cs at goal Discussed diet/exercise, weight management  Defer A1C; check CMP  Labile hypertension Controlled today, will continue to monitor  Medication management -     Magnesium -     CBC with Differential/Platelet -     COMPLETE METABOLIC PANEL WITH GFR  BMI 25.0-25.9 Discussed dietary and exercise modifications Continue to recommend diet heavy in fruits and veggies and low in animal meats, cheeses, and dairy products, appropriate calorie intake Discuss exercise recommendations routinely Continue to monitor weight at each visit  Continue diet and meds as discussed. Further disposition pending results of labs. Discussed med's effects and SE's.   Over 30 minutes of  exam, counseling, chart review, and critical decision making was performed.   Future Appointments  Date Time Provider Department Center  12/26/2019  2:00 PM , MD [facility] None  03/14/2020 10:30 AM 12/28/2019, MD [facility] None  09/08/2020 11:00 AM 03/16/2020, MD [facility] None    ----------------------------------------------------------------------------------------------------------------------  HPI 59 y.o. female  presents for 3 month follow up on elevated blood pressures w/o diagnosis of HTN, HLD, Migraine, Hypothyroidism,  Pre-Diabetes and Vitamin D Deficiency.   In 2009, she had a Right MCA CVA attributed to a Right Carotid artery dissection from fibromuscular dysplasia and has residual of a mild spastic Left HP and mild cognitive dysfunction difficulty focusing/concentrating. These latter sx's are improved with routine Adderall.  Patient also follow with Dr Lucky Cowboy, Neurologist, for Bo-Tox injections for her spasticity and migraines.   Weather is a trigger, situational stress trigger for migraines. She is drinking about  Follows with Dr 77.  she has a diagnosis of anxiety and is currently on wellbutrin 300 mg and PRN xanax. Her anxiety has been worse, she is on several other serotonin agents. she reports currently takes xanax rarely, takes 1/2 tab just a few days per month but feels that it is too strong so does not want to take.  Need most recent labs faxed Dr 2010, [facility]  BMI is Body mass index is 24.72 kg/m., she has been working on diet and exercise. Wt Readings from Last 3 Encounters:  12/13/19 165 lb (74.8 kg)  10/04/19 162 lb (73.5 kg)  09/06/19 162 lb 3.2 oz (73.6 kg)   Her blood pressure has been controlled at home, today their BP is BP:  120/80  She does not workout. She denies any cardiac symptoms, chest pains, palpitations, shortness of breath, dizziness or lower extremity edema.     She is on cholesterol medication Rosuvastatin 40 mg  every other day, fenofibrate 134 mg daily and denies myalgias. Her cholesterol is at goal. The cholesterol last visit was:   Lab Results  Component Value Date   CHOL 159 09/06/2019   HDL 60 09/06/2019   LDLCALC 73 09/06/2019   TRIG 189 (H) 09/06/2019   CHOLHDL 2.7 09/06/2019    She has not been working on diet and exercise for prediabetes ( 5.8%, 2008), and denies hyperglycemia, hypoglycemia , nausea, polydipsia and polyuria. Last A1C in the office was:  Lab Results  Component Value Date   HGBA1C 5.4 09/06/2019   Patient has been on Thyroid Supression since Thyroid surgeryfor benign nodule and goiterin 2007.  She is currently taking levothyroxine 88 mcg daily.   Her medication was not changed last visit.   Lab Results  Component Value Date   TSH 0.49 09/06/2019   Patient is on Vitamin D supplement, has cut back from 50,000 once a month and does 1000mcg once a week Lab Results  Component Value Date   VD25OH 76 09/06/2019        Current Medications:   Current Outpatient Medications (Endocrine & Metabolic):  .  dexamethasone (DECADRON) 4 MG tablet, Take 1 tab 3 x day - 3 days, then 2 x day - 3 days, then 1 tab daily .  levothyroxine (SYNTHROID) 88 MCG tablet, Take 1 tablet daily on an empty stomach with only water for 30 minutes & no Antacid meds, Calcium or Magnesium for 4 hours & avoid Biotin  Current Outpatient Medications (Cardiovascular):  .  fenofibrate micronized (LOFIBRA) 134 MG capsule, TAKE 1 CAPSULE EVERY DAY .  rosuvastatin (CRESTOR) 40 MG tablet, Take 1 tablet (40 mg total) by mouth daily. Take 1 tablet Daily for Cholesterol .  verapamil (CALAN-SR) 240 MG CR tablet, TAKE 1 TABLET DAILY WITH FOOD FOR BP & MIGRAINE PREVENTION  Current Outpatient Medications (Respiratory):  .  albuterol (PROVENTIL HFA) 108 (90 Base) MCG/ACT inhaler, 1 to 2 inhalations 10-15 minutes apart every 4 hours if needed for asthma rescue .  BREO ELLIPTA 100-25 MCG/INH AEPB, 1 INHALATION DAILY  TO PREVENT ASTHMA .  cetirizine (ZYRTEC) 10 MG tablet, Take 10 mg by mouth daily. .  montelukast (SINGULAIR) 10 MG tablet, Take 1 tablet Daily  for Allergies.  Current Outpatient Medications (Analgesics):  .  aspirin EC 81 MG tablet, Take 81 mg by mouth daily. .  butalbital-acetaminophen-caffeine (FIORICET) 50-325-40 MG tablet, Take 1 tablet by mouth daily as needed for headache. Do not refill in less than 30 days, .  Diclofenac Potassium,Migraine, (CAMBIA) 50 MG PACK, MIX 1 PACK WITH 1-2 OUNCES OF WATER AND DRINK AT ONSET OF MIGRAINE CAN REPEAT IN 2 HOURS IF NEEDED MAX 2/24 HOURS  Current Outpatient Medications (Hematological):  .  vitamin B-12 (CYANOCOBALAMIN) 1000 MCG tablet, Take 1,000 mcg by mouth daily.  Current Outpatient Medications (Other):  Marland Kitchen.  ALPRAZolam (XANAX) 1 MG tablet, Take 1/2-1 tablet at Bedtime ONLY if needed for Sleep  &  limit to 5 days /week to avoid Addiction & Dementia .  augmented betamethasone dipropionate (DIPROLENE-AF) 0.05 % cream, Apply topically 2 (two) times daily as needed. .  Botulinum Toxin Type A (BOTOX) 200 UNITS SOLR, Inject 400 Units as directed every 3 (three) months. Change to BOTOX A 100  units .  buPROPion (WELLBUTRIN XL) 300 MG 24 hr tablet, TAKE 1 TABLET EVERY MORNING FOR MOOD. FOCUS & CONCENTRATION .  diclofenac Sodium (VOLTAREN) 1 % GEL, Apply 4 g topically 4 (four) times daily as needed. .  Estradiol (YUVAFEM) 10 MCG TABS vaginal tablet, Place 10 mcg vaginally 2 (two) times a week. .  magnesium oxide (MAG-OX) 400 MG tablet, Take 400 mg by mouth 2 (two) times daily.  .  Multiple Vitamin (MULTIVITAMIN WITH MINERALS) TABS tablet, Take 1 tablet by mouth daily. .  Multiple Vitamins-Minerals (ZINC PO), Take 1 tablet by mouth daily. .  nortriptyline (PAMELOR) 25 MG capsule, Take 25 mg by mouth at bedtime. Marland Kitchen  omeprazole (PRILOSEC) 40 MG capsule, TAKE 1 CAPSULE DAILY FOR ACID INDIGESTION & REFLUX .  ondansetron (ZOFRAN ODT) 8 MG disintegrating tablet,  Dissolve 1 tablet under tongue every 6 to 8 hours for nausea  or vomitting .  Riboflavin 100 MG TABS, Take 100 mg by mouth. bid .  topiramate (TOPAMAX) 100 MG tablet, Take 2 tablets (200 mg total) by mouth daily. .  Vitamin D, Ergocalciferol, (DRISDOL) 1.25 MG (50000 UT) CAPS capsule, Take 1 capsule daily for Vitamin D deficiency. Marland Kitchen  amphetamine-dextroamphetamine (ADDERALL) 10 MG tablet, Take 1/2 to 1 tablet 1 or 2 x daily for ADD  Allergies:  Allergies  Allergen Reactions  . Penicillins Anaphylaxis and Hives  . Imitrex [Sumatriptan]   . Other Swelling    kiwi  . Sulfa Antibiotics Hives  . Zomig [Zolmitriptan]      Medical History:  Past Medical History:  Diagnosis Date  . Anemia   . Anxiety   . Asthma   . Chronic heartburn   . Depression   . Elevated cholesterol    on Crestor  . Headache(784.0)   . Hypertension   . Hypothyroidism    S/P thyroidectomy  . Migraine   . Occlusion and stenosis of carotid artery with cerebral infarction   . Spasm of muscle   . Unspecified cerebral artery occlusion with cerebral infarction   . Vitamin D deficiency     Family history- Reviewed and unchanged  Social history- Reviewed and unchanged  Screening Tests: Immunization History  Administered Date(s) Administered  . Influenza Inj Mdck Quad With Preservative 01/20/2017, 01/09/2018, 01/10/2019  . Influenza Split 01/24/2014  . Influenza, Seasonal, Injecte, Preservative Fre 02/09/2016  . Influenza-Unspecified 11/28/2011  . PFIZER SARS-COV-2 Vaccination 06/11/2019, 07/04/2019  . PPD Test 04/04/2013, 04/22/2015, 05/18/2016, 06/20/2017, 07/27/2018  . Pneumococcal Polysaccharide-23 03/29/1998  . Td 03/29/2002  . Tdap 05/18/2016     Review of Systems:  Review of Systems  Constitutional: Negative for chills, diaphoresis, fever, malaise/fatigue and weight loss.  HENT: Negative for congestion, ear discharge, ear pain, hearing loss, nosebleeds, sinus pain, sore throat and tinnitus.    Eyes: Negative for blurred vision, double vision, photophobia, pain, discharge and redness.  Respiratory: Negative for cough, hemoptysis, sputum production, shortness of breath, wheezing and stridor.   Cardiovascular: Negative for chest pain, palpitations, orthopnea, claudication, leg swelling and PND.  Gastrointestinal: Negative for abdominal pain, blood in stool, constipation, diarrhea, heartburn, melena, nausea and vomiting.  Genitourinary: Negative for dysuria, flank pain, frequency, hematuria and urgency.  Musculoskeletal: Negative for back pain, falls, joint pain, myalgias and neck pain.  Skin: Negative for itching and rash.  Neurological: Negative for dizziness, tingling, tremors, sensory change, speech change, focal weakness, seizures, loss of consciousness, weakness and headaches.  Endo/Heme/Allergies: Negative for environmental allergies and polydipsia. Does not bruise/bleed easily.  Psychiatric/Behavioral: Negative for  depression, hallucinations, memory loss, substance abuse and suicidal ideas. The patient is not nervous/anxious and does not have insomnia.      Physical Exam: BP 120/80   Pulse 90   Temp 97.6 F (36.4 C)   Wt 165 lb (74.8 kg)   SpO2 98%   BMI 24.72 kg/m  Wt Readings from Last 3 Encounters:  12/13/19 165 lb (74.8 kg)  10/04/19 162 lb (73.5 kg)  09/06/19 162 lb 3.2 oz (73.6 kg)    General Appearance: Well nourished, in no apparent distress. Eyes: PERRLA, EOMs, conjunctiva no swelling or erythema Sinuses: No Frontal/maxillary tenderness ENT/Mouth: Ext aud canals clear, TMs without erythema, bulging. No erythema, swelling, or exudate on post pharynx.  Tonsils not swollen or erythematous. Hearing normal.  Neck: Supple, thyroid normal.  Respiratory: Respiratory effort normal, BS equal bilaterally without rales, rhonchi, wheezing or stridor.  Cardio: RRR with no MRGs. Brisk peripheral pulses without edema.  Abdomen: Soft, + BS.  Non tender, no guarding,  rebound, hernias, masses. Lymphatics: Non tender without lymphadenopathy.  Musculoskeletal: Full ROM, 5/5 strength right, 4/5 strength on left. normal gait, slow. Skin: Warm, dry without rashes, lesions, ecchymosis.  Neuro: Cranial nerves intact. No cerebellar symptoms.  Psych: Awake and oriented X 3, normal affect, Insight and Judgment appropriate.    Quentin Mulling, PA-C 12:37 PM [facility]

## 2019-12-13 ENCOUNTER — Encounter: Payer: Self-pay | Admitting: Physician Assistant

## 2019-12-13 ENCOUNTER — Ambulatory Visit (INDEPENDENT_AMBULATORY_CARE_PROVIDER_SITE_OTHER): Payer: BC Managed Care – PPO | Admitting: Physician Assistant

## 2019-12-13 ENCOUNTER — Other Ambulatory Visit: Payer: Self-pay

## 2019-12-13 VITALS — BP 120/80 | HR 90 | Temp 97.6°F | Wt 165.0 lb

## 2019-12-13 DIAGNOSIS — R0989 Other specified symptoms and signs involving the circulatory and respiratory systems: Secondary | ICD-10-CM | POA: Diagnosis not present

## 2019-12-13 DIAGNOSIS — E782 Mixed hyperlipidemia: Secondary | ICD-10-CM

## 2019-12-13 DIAGNOSIS — F419 Anxiety disorder, unspecified: Secondary | ICD-10-CM

## 2019-12-13 DIAGNOSIS — F988 Other specified behavioral and emotional disorders with onset usually occurring in childhood and adolescence: Secondary | ICD-10-CM

## 2019-12-13 DIAGNOSIS — Z79899 Other long term (current) drug therapy: Secondary | ICD-10-CM

## 2019-12-13 DIAGNOSIS — E890 Postprocedural hypothyroidism: Secondary | ICD-10-CM

## 2019-12-13 DIAGNOSIS — R7309 Other abnormal glucose: Secondary | ICD-10-CM

## 2019-12-13 MED ORDER — ALPRAZOLAM 0.5 MG PO TABS: ORAL_TABLET | ORAL | 0 refills | Status: DC

## 2019-12-13 MED ORDER — AMPHETAMINE-DEXTROAMPHETAMINE 10 MG PO TABS: ORAL_TABLET | ORAL | 0 refills | Status: DC

## 2019-12-13 MED ORDER — BUPROPION HCL ER (XL) 150 MG PO TB24: ORAL_TABLET | ORAL | 2 refills | Status: DC

## 2019-12-13 NOTE — Patient Instructions (Addendum)
Counseling services  I suggest calling your insurance and finding out who is in your network and THEN calling those people or looking them up on google.   I'm a big fan of Cognitive Behavioral Therapy, look this up on You tube or check with the therapist you see if they are certified.  This form of therapy helps to teach you skills to better handle with current situation that are causing anxiety or depression.   There are some great apps too Check out GTHX, give thanks app.  Meditations apps are great like headspace.     Please be aware that some of the medications that you are on can sometimes cause a rare and potentially dangerous adverse reaction, called SEROTONIN SYNDROME: Symptoms of this condition include (but are not limited to):  Agitation or restlessness, confusion, rapid heart rate and high blood pressure, dilated pupils, loss of muscle coordination or twitching muscles, muscle rigidity/stiffness, sweating and/or flushing, diarrhea, headache, shivering, goose bumps. If you have any of these symptoms you may have to stop the medication. Call your health care provider immediately.  Severe serotonin syndrome can be life-threatening emergency. Signs and symptoms of a severe reaction may include: high fever, seizures, irregular heartbeat, unconsciousness or altered level of awareness or personality changes.  If you have any of these new symptoms, call 911 or have someone take you to the emergency room.    Cut back on the wellbutrin 150mg  daily and see if this helps anxiety

## 2019-12-14 LAB — CBC WITH DIFFERENTIAL/PLATELET
Absolute Monocytes: 297 cells/uL (ref 200–950)
Basophils Absolute: 39 cells/uL (ref 0–200)
Basophils Relative: 0.7 %
Eosinophils Absolute: 230 cells/uL (ref 15–500)
Eosinophils Relative: 4.1 %
HCT: 44.2 % (ref 35.0–45.0)
Hemoglobin: 15.3 g/dL (ref 11.7–15.5)
Lymphs Abs: 1288 cells/uL (ref 850–3900)
MCH: 32.8 pg (ref 27.0–33.0)
MCHC: 34.6 g/dL (ref 32.0–36.0)
MCV: 94.6 fL (ref 80.0–100.0)
MPV: 11.3 fL (ref 7.5–12.5)
Monocytes Relative: 5.3 %
Neutro Abs: 3746 cells/uL (ref 1500–7800)
Neutrophils Relative %: 66.9 %
Platelets: 176 10*3/uL (ref 140–400)
RBC: 4.67 10*6/uL (ref 3.80–5.10)
RDW: 12.4 % (ref 11.0–15.0)
Total Lymphocyte: 23 %
WBC: 5.6 10*3/uL (ref 3.8–10.8)

## 2019-12-14 LAB — COMPLETE METABOLIC PANEL WITH GFR
AG Ratio: 2 (calc) (ref 1.0–2.5)
ALT: 25 U/L (ref 6–29)
AST: 17 U/L (ref 10–35)
Albumin: 4.3 g/dL (ref 3.6–5.1)
Alkaline phosphatase (APISO): 82 U/L (ref 37–153)
BUN/Creatinine Ratio: 16 (calc) (ref 6–22)
BUN: 16 mg/dL (ref 7–25)
CO2: 25 mmol/L (ref 20–32)
Calcium: 9 mg/dL (ref 8.6–10.4)
Chloride: 108 mmol/L (ref 98–110)
Creat: 1.02 mg/dL — ABNORMAL HIGH (ref 0.50–0.99)
GFR, Est African American: 69 mL/min/{1.73_m2} (ref >=60)
GFR, Est Non African American: 59 mL/min/{1.73_m2} — ABNORMAL LOW (ref >=60)
Globulin: 2.1 g/dL (calc) (ref 1.9–3.7)
Glucose, Bld: 113 mg/dL — ABNORMAL HIGH (ref 65–99)
Potassium: 4 mmol/L (ref 3.5–5.3)
Sodium: 140 mmol/L (ref 135–146)
Total Bilirubin: 0.3 mg/dL (ref 0.2–1.2)
Total Protein: 6.4 g/dL (ref 6.1–8.1)

## 2019-12-14 LAB — LIPID PANEL
Cholesterol: 163 mg/dL (ref <200)
HDL: 52 mg/dL (ref >=50)
LDL Cholesterol (Calc): 73 mg/dL (calc)
Non-HDL Cholesterol (Calc): 111 mg/dL (calc) (ref <130)
Total CHOL/HDL Ratio: 3.1 (calc) (ref <5.0)
Triglycerides: 319 mg/dL — ABNORMAL HIGH (ref <150)

## 2019-12-14 LAB — MAGNESIUM: Magnesium: 2.1 mg/dL (ref 1.5–2.5)

## 2019-12-14 LAB — TSH: TSH: 3.22 mIU/L (ref 0.40–4.50)

## 2019-12-19 ENCOUNTER — Other Ambulatory Visit: Payer: Self-pay | Admitting: Internal Medicine

## 2019-12-19 DIAGNOSIS — Z1231 Encounter for screening mammogram for malignant neoplasm of breast: Secondary | ICD-10-CM

## 2019-12-19 DIAGNOSIS — E039 Hypothyroidism, unspecified: Secondary | ICD-10-CM

## 2019-12-25 ENCOUNTER — Telehealth: Payer: Self-pay | Admitting: Neurology

## 2019-12-25 NOTE — Telephone Encounter (Signed)
Patient has a Botox appointment tomorrow. She has an active PA on file, [place] #[id number] (03/14/2019-03/12/2020). I have called [facility] several times in the last week to get her Botox delivered for her appointment. I spoke with Derryl Harbor today who states she will mark it as an expedited request.

## 2019-12-26 ENCOUNTER — Other Ambulatory Visit: Payer: Self-pay

## 2019-12-26 ENCOUNTER — Ambulatory Visit (INDEPENDENT_AMBULATORY_CARE_PROVIDER_SITE_OTHER): Payer: BC Managed Care – PPO | Admitting: Neurology

## 2019-12-26 ENCOUNTER — Encounter: Payer: Self-pay | Admitting: Neurology

## 2019-12-26 VITALS — BP 140/84 | HR 92 | Ht 68.5 in | Wt 162.0 lb

## 2019-12-26 DIAGNOSIS — Z8673 Personal history of transient ischemic attack (TIA), and cerebral infarction without residual deficits: Secondary | ICD-10-CM

## 2019-12-26 DIAGNOSIS — G43709 Chronic migraine without aura, not intractable, without status migrainosus: Secondary | ICD-10-CM | POA: Diagnosis not present

## 2019-12-26 DIAGNOSIS — I69354 Hemiplegia and hemiparesis following cerebral infarction affecting left non-dominant side: Secondary | ICD-10-CM

## 2019-12-26 DIAGNOSIS — I69954 Hemiplegia and hemiparesis following unspecified cerebrovascular disease affecting left non-dominant side: Secondary | ICD-10-CM

## 2019-12-26 MED ORDER — AIMOVIG 70 MG/ML ~~LOC~~ SOAJ: 70.0000 mg | SUBCUTANEOUS | 11 refills | Status: DC

## 2019-12-26 MED ORDER — UBRELVY 50 MG PO TABS: 50.0000 mg | ORAL_TABLET | ORAL | 11 refills | Status: DC | PRN

## 2019-12-26 NOTE — Progress Notes (Signed)
**  Botox 100 units x 4 vials, NDC 4784-1282-08, Lot [id number], Exp 04/2022, office supply.//mck,rn**

## 2019-12-26 NOTE — Progress Notes (Signed)
HISTORICAL  Ruth Gregory is a 59 year old right-handed female  History of right internal carotid section with spastic left hemiparesis, Botox injection every 3 months  She has history of right internal carotid artery dissection following an episode of vomiting from migraine headache in mid June 2009. She was given IV TPA and found to have right MCA occlusion. She underwent emergent right carotid artery stent with distal endovascular recanalization of the middle cerebral artery.  She had small hemorrhagic transformation of the right basal ganglia with mild left hemiparesis, but has done well since then and has been independent.  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  She has been receiving BOTOX injection since 01/2009 every 3 months, to her left upper extremity and also left lower extremity, responded well.  Repeat US carotid were normal in October 2013  Chronic migraine headaches: She had long-standing history of chronic migraine, average 2-4 headaches each months, she has increased frequency headaches around fall, and spring, she is now taking Topamax ER 150 mg every night as preventive medication, magnesium oxide 400 mg, riboflavin 100 mg twice a day,  She is not candidate for triptan because previous history of stroke, Cambia works well for her, she her insurance only allow her to have 9 tablets each months. Occasionally she take Fioricet, hydrocodone as needed, even prednisone  20 mg as needed as rescue therapy  Over the past few weeks, she been having to 3 headaches each week, used out her cambia, she also complains of neck muscle tightness, receiving chiropractor sometimes   Last Botox injection to spastic left upper extremity works well, but she noticed left hand muscle weakness.  UPDATE Mar 26 2015: She responded very well to previous Botox injection in September 2016, but noticed increased left hand weakness, gradually recovered over the past few weeks  Update July 15 2015: She responded very well to previous Botox injection in December 20 eighth 2016, noticed returning of left finger flexion spasticity, left ankle plantarflexion, want today's injection emphasize on above abnormal posturing.  She still has frequent migraine headaches, 1-2 headache each week, some headache was protracted, lasting 1-2 days, require multiple dose of cambia and Fioricet  Update November 20 2015: Patient called in September 09 2015 "She also said the left hand is very weak- 12,3 digits > 4,5. She said last OV she rec'd botox in wrist. She sts she may not be able to get botox next month if she is not feeling better"  She has a lot of pain on her left leg, mainly her left ankle from knee down, throbbing, tooth ache, she noticed more tendency of left ankle plantar flexion.  She continue have intermittent migraine headaches, more at the left retro-orbital  Area  UPDATE Dec 6th 2017: She responded very well to previous Botox injection, complains frequent migraine headaches, once a week, lasting 1-2 days, partial response to combination of cambia, Fioricet, Compazine, she is taking Topamax ER 150 mg as preventive medications  UPDATE June 30 2016: She had  her first Botox injection for migraine prevention in December 2017, with only used 75 units she reported dramatic improvement, she only has migraines about once a week, which is milder, better controlled by the medication, instead of 2-3 times  more severe headaches, at the end of the benefit, she noticed recurrent of her headaches, she also noticed mild left shoulder stiffness, left ankle turned in,  She noticed worsening right knee pain, recently had right knee injection.  Update November 04 2016: She responded well to previous injection, Botox injection to bilateral frontal temporal region has helped her headache,  UPDATE Mar 11 2017: She responded well to previous injection in August 2018.  Reported increased migraine headache over the past few weeks,  UPDATE Aug 08 2017: She is having migraine almost every week, lasting for 2 days, she is hesitate   UPDATE Sept 30 2019: She missed her appointment in August, she was given toradol and pheneran by pcp recently for protracted migraine headache, which did help her headache, but less effective than Depacon  She continue have 1 or 2 migraine headaches 1 to 2 weeks, also noticed increased left ankle spasticity, unsteady gait  UPDATE Mar 23 2018: She still has frequent headaches, want to explore the possibility of CGRP, responded very well to previous injection,  UPDATE Aug 23 2018: She responded very well to previous injection, only has occasional migraine headaches  UPDATE Sept 2 2020: She responded well to previous injection, had occasionally migraine headaches, Cambia works well for her, left hand finger spasm has much improved, she continue has mild left ankle plantarflexion,  Update March 15, 2019: She responded well to previous injection, increased migraine due to her Covid infection recently  UPDATE June 18 2019: She developed some left finger weakness following previous injection in December, recovered, doing well,  She also reported 1 episode of low while driving, made the wrong turn, she denied loss of consciousness, still has frequent migraine headaches  UPDATE September 26 2019: She did well with previous injection, especially the left hand, we used Botox 400 units  today  UPDATE December 26, 2019: She elected to receive last injection for left forearm at previous injection in June 2021, over the past few weeks, noticed worsening left hand  cupping, spasm,  She also have frequent migraine headaches, Review of system: Pertinent as above   ALLERGIES: Allergies  Allergen Reactions  . Penicillins Anaphylaxis and Hives  . Imitrex [Sumatriptan]   . Other Swelling    kiwi  . Sulfa Antibiotics Hives  . Zomig [Zolmitriptan]     HOME MEDICATIONS: Current Outpatient Medications  Medication Sig Dispense Refill  . albuterol (PROVENTIL HFA) 108 (90 Base) MCG/ACT inhaler 1 to 2 inhalations 10-15 minutes apart every 4 hours if needed for asthma rescue 48 g 3  . ALPRAZolam (XANAX) 0.5 MG tablet Take 1/2-1 tablet at Bedtime ONLY if needed for Sleep 30 tablet 0  . amphetamine-dextroamphetamine (ADDERALL) 10 MG tablet Take 1/2 to 1 tablet 1 or 2 x daily for ADD 60 tablet 0  . aspirin EC 81 MG tablet Take 81 mg by mouth daily.    Marland Kitchen. augmented betamethasone dipropionate (DIPROLENE-AF) 0.05 % cream Apply topically 2 (two) times daily as needed.    . Botulinum Toxin Type A (BOTOX) 200 UNITS SOLR Inject 400 Units as directed every 3 (three) months. Change to BOTOX A 100  units    . BREO ELLIPTA 100-25 MCG/INH AEPB 1 INHALATION DAILY TO PREVENT ASTHMA 180 each 3  .  buPROPion (WELLBUTRIN XL) 150 MG 24 hr tablet TAKE 1 TABLET EVERY MORNING FOR MOOD. FOCUS & CONCENTRATION 30 tablet 2  . butalbital-acetaminophen-caffeine (FIORICET) 50-325-40 MG tablet Take 1 tablet by mouth daily as needed for headache. Do not refill in less than 30 days, 12 tablet 5  . cetirizine (ZYRTEC) 10 MG tablet Take 10 mg by mouth daily.    Marland Kitchen dexamethasone (DECADRON) 4 MG tablet Take 1 tab 3 x day - 3 days, then 2 x day - 3 days, then 1 tab daily 20 tablet 0  . Diclofenac Potassium,Migraine, (CAMBIA) 50 MG PACK MIX 1 PACK WITH 1-2 OUNCES OF WATER AND DRINK AT ONSET OF MIGRAINE CAN REPEAT IN 2 HOURS  IF NEEDED MAX 2/24 HOURS 8 each 11  . diclofenac Sodium (VOLTAREN) 1 % GEL Apply 4 g topically 4 (four) times daily as needed. 500 g 6  . Estradiol (YUVAFEM) 10 MCG TABS vaginal tablet Place 10 mcg vaginally 2 (two) times a week.    . fenofibrate micronized (LOFIBRA) 134 MG capsule TAKE 1 CAPSULE EVERY DAY 90 capsule 3  . levothyroxine (SYNTHROID) 88 MCG tablet Take 1 tablet daily on an empty stomach with only water for 30 minutes & no Antacid meds, Calcium or Magnesium for 4 hours & avoid Biotin 90 tablet 0  . magnesium oxide (MAG-OX) 400 MG tablet Take 400 mg by mouth 2 (two) times daily.     . montelukast (SINGULAIR) 10 MG tablet Take 1 tablet Daily  for Allergies. 90 tablet 3  . Multiple Vitamin (MULTIVITAMIN WITH MINERALS) TABS tablet Take 1 tablet by mouth daily.    . Multiple Vitamins-Minerals (ZINC PO) Take 1 tablet by mouth daily.    . nortriptyline (PAMELOR) 25 MG capsule Take 25 mg by mouth at bedtime.    Marland Kitchen omeprazole (PRILOSEC) 40 MG capsule TAKE 1 CAPSULE DAILY FOR ACID INDIGESTION & REFLUX 90 capsule 3  . ondansetron (ZOFRAN ODT) 8 MG disintegrating tablet Dissolve 1 tablet under tongue every 6 to 8 hours for nausea  or vomitting 30 tablet 0  . Riboflavin 100 MG TABS Take 100 mg by mouth. bid    . rosuvastatin (CRESTOR) 40 MG tablet Take 1 tablet (40 mg total) by mouth daily. Take 1 tablet Daily for Cholesterol 90 tablet 3  . topiramate (TOPAMAX) 100 MG tablet Take 2 tablets (200 mg total) by mouth daily. 180 tablet 3  . verapamil (CALAN-SR) 240 MG CR tablet TAKE 1 TABLET DAILY WITH FOOD FOR BP & MIGRAINE PREVENTION 90 tablet 3  . vitamin B-12 (CYANOCOBALAMIN) 1000 MCG tablet Take 1,000 mcg by mouth daily.    . Vitamin D, Ergocalciferol, (DRISDOL) 1.25 MG (50000 UT) CAPS capsule Take 1 capsule daily for Vitamin D deficiency. 90 capsule 1  . Erenumab-aooe (AIMOVIG) 70 MG/ML SOAJ Inject 70 mg into the skin every 30 (thirty) days. 1 mL 11  . Ubrogepant (UBRELVY) 50 MG TABS Take 50 mg by  mouth as needed. May repeat once in 2 hours 12 tablet 11   No current facility-administered medications for this visit.    PAST MEDICAL HISTORY: Past Medical History:  Diagnosis Date  . Anemia   . Anxiety   . Asthma   . Chronic heartburn   . Depression   . Elevated cholesterol    on Crestor  . Headache(784.0)   . Hypertension   . Hypothyroidism    S/P thyroidectomy  . Migraine   . Occlusion and stenosis of carotid artery with cerebral infarction   .  Spasm of muscle   . Unspecified cerebral artery occlusion with cerebral infarction   . Vitamin D deficiency     PAST SURGICAL HISTORY: Past Surgical History:  Procedure Laterality Date  . AUGMENTATION MAMMAPLASTY Bilateral 2000  . COSMETIC SURGERY    . CYSTOCELE REPAIR N/A 01/08/2013   Procedure: ANTERIOR REPAIR (CYSTOCELE);  Surgeon: Meriel Pica, MD;  Location: [facility];  Service: Gynecology;  Laterality: N/A;  . NASAL SINUS SURGERY    . VAGINAL HYSTERECTOMY      FAMILY HISTORY: Family History  Problem Relation Age of Onset  . Heart disease Mother   . Peptic Ulcer Disease Mother   . Heart disease Father   . Parkinson's disease Father     SOCIAL HISTORY:  Social History   Socioeconomic History  . Marital status: Married    Spouse name: Brett Canales  . Number of children: 2  . Years of education: college  . Highest education level: Not on file  Occupational History  . Occupation: DESIGNER    Employer: [employer]  Tobacco Use  . Smoking status: Never Smoker  . Smokeless tobacco: Never Used  Substance and Sexual Activity  . Alcohol use: Yes    Comment: occassionally drinks a glass of wine  . Drug use: No  . Sexual activity: Not on file  Other Topics Concern  . 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 Patient is married and lives with her husband. Patient has college education.   Right handed.   Caffeine- two cups daily.   Social Determinants of Health    Financial Resource Strain:   . Difficulty of Paying Living Expenses: Not on file  Food Insecurity:   . Worried About Programme researcher, broadcasting/film/video in the Last Year: Not on file  . Ran Out of Food in the Last Year: Not on file  Transportation Needs:   . Lack of Transportation (Medical): Not on file  . Lack of Transportation (Non-Medical): Not on file  Physical Activity:   . Days of Exercise per Week: Not on file  . Minutes of Exercise per Session: Not on file  Stress:   . Feeling of Stress : Not on file  Social Connections:   . Frequency of Communication with Friends and Family: Not on file  . Frequency of Social Gatherings with Friends and Family: Not on file  . Attends Religious Services: Not on file  . Active Member of Clubs or Organizations: Not on file  . Attends Banker Meetings: Not on file  . Marital Status: Not on file  Intimate Partner Violence:   . Fear of Current or Ex-Partner: Not on file  . Emotionally Abused: Not on file  . Physically Abused: Not on file  . Sexually Abused: Not on file     PHYSICAL EXAM   Vitals:   12/26/19 1427  BP: 140/84  Pulse: 92  Weight: 162 lb (73.5 kg)  Height: 5' 8.5" (1.74 m)   Not recorded     Body mass index is 24.27 kg/m.      NEUROLOGICAL EXAM:   MOTOR: Mild spastic left hemiparesis, left upper extremity proximal and distal 4/5, mild left hip flexion and left ankle dorsiflexion weakness   GAIT/STANCE: Mildly unsteady gait, checking her left foot across the floor  Romberg is absent.   DIAGNOSTIC DATA (LABS, IMAGING, TESTING) - I reviewed patient records, labs, notes, testing and imaging myself where available.  ASSESSMENT AND PLAN  Ruth Gregory is a 59 y.o. female   Chronic migraine headaches  Continue preventive medications, nortriptyline 25 mg every night, topiramate 200 mg daily  Cambia, Fioricet, Compazine as needed,+ tizanidine prn  Continue magnesium oxide, riboflavin as preventive  medications,  She has received 100 units of Botox as migraine prevention, she wants to try CGrp agonist, will try Aimovig 70 mg, also Ubrelvy as needed   Spastic left hemiparesis following right internal carotid artery dissection, right MCA stroke  EMG guided Botox injection, used 400 units of Botox A total   Left pronator teres 25 units Left palmaris longus 25  units Left levator scapular 50 units Left flexor carpi ulnar 25 units Left flexor digitorum superficialis 25 units Left flexor digitorum profundus  25 units Left iliocostalis 25 units Left pectoralis major 50 units Left latissimus dorsi 50 units   I also injected  100 units for chronic migraine headache  Temporalis 40 units Occipital 25 units Upper cervical 15 units Left parietal region 20 units  We will change to Botox 300 units, use aimovig as migraine prevention, also Ubrelvy as needed for abortive treatment  Levert Feinstein, M.D. Ph.D.  [facility] [address], [address] [place], [place] [address] Ph: [phone] Fax: [phone]
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
## 2019-12-26 NOTE — Telephone Encounter (Signed)
I called [facility] to check the status. It appears that the order keeps being cancelled by [facility] for both my June and September requests. I believe this has to do with insurance. Patient has Ruth Gregory, so in the future I will attempt to use [facility]. [facility] will still fill Botox for a few plans, including Anthem.

## 2019-12-26 NOTE — Telephone Encounter (Signed)
Please decrease BOTOX to 300 units at next injection

## 2019-12-27 ENCOUNTER — Ambulatory Visit: Payer: BC Managed Care – PPO | Admitting: Neurology

## 2019-12-28 ENCOUNTER — Other Ambulatory Visit: Payer: Self-pay | Admitting: Internal Medicine

## 2019-12-28 ENCOUNTER — Telehealth: Payer: Self-pay | Admitting: *Deleted

## 2019-12-28 DIAGNOSIS — M25551 Pain in right hip: Secondary | ICD-10-CM | POA: Diagnosis not present

## 2019-12-28 NOTE — Telephone Encounter (Signed)
We received a PA request for her Aimovig 70mg . She has medical coverage with [phone]) and pharmacy coverage with [facility] (ph:[phone]). We have been unable to successfully start a PA case despite multiple attempts through different avenues (covermymeds, phone calls). Per [employer], there is an issue with the patient's pharmacy coverage and they are requesting the her to call member services. I spoke to the patient and provided her with this information. She is going to call her health plan and the pharmacy today. If she is unable to get her medication, then I have instructed her to call me back so that I can be of further assistance. She verbalized understanding.

## 2020-01-01 NOTE — Telephone Encounter (Signed)
I received a fax from [facility] needing prescription clarification. I called [facility] and spoke with Wynona Canes to see what needed to be done. She transferred me to the pharmacy and I spoke with Tobi Bastos to give her a verbal prescription. Tobi Bastos also took [employer].

## 2020-01-02 DIAGNOSIS — M25551 Pain in right hip: Secondary | ICD-10-CM | POA: Diagnosis not present

## 2020-01-04 ENCOUNTER — Other Ambulatory Visit: Payer: Self-pay

## 2020-01-04 ENCOUNTER — Ambulatory Visit
Admission: RE | Admit: 2020-01-04 | Discharge: 2020-01-04 | Disposition: A | Discharge status: Home / Self Care | Payer: BC Managed Care – PPO | Source: Ambulatory Visit | Attending: Internal Medicine | Admitting: Internal Medicine

## 2020-01-04 DIAGNOSIS — Z1231 Encounter for screening mammogram for malignant neoplasm of breast: Secondary | ICD-10-CM

## 2020-01-05 ENCOUNTER — Other Ambulatory Visit: Payer: Self-pay | Admitting: Physician Assistant

## 2020-01-09 DIAGNOSIS — M25551 Pain in right hip: Secondary | ICD-10-CM | POA: Diagnosis not present

## 2020-01-10 DIAGNOSIS — Z1159 Encounter for screening for other viral diseases: Secondary | ICD-10-CM | POA: Diagnosis not present

## 2020-01-14 DIAGNOSIS — Z1211 Encounter for screening for malignant neoplasm of colon: Secondary | ICD-10-CM | POA: Diagnosis not present

## 2020-01-14 LAB — HM COLONOSCOPY

## 2020-01-17 DIAGNOSIS — M25551 Pain in right hip: Secondary | ICD-10-CM | POA: Diagnosis not present

## 2020-01-22 ENCOUNTER — Other Ambulatory Visit: Payer: Self-pay | Admitting: Adult Health

## 2020-01-22 ENCOUNTER — Other Ambulatory Visit: Payer: Self-pay | Admitting: Internal Medicine

## 2020-01-22 DIAGNOSIS — E782 Mixed hyperlipidemia: Secondary | ICD-10-CM

## 2020-01-22 MED ORDER — FENOFIBRATE MICRONIZED 134 MG PO CAPS: ORAL_CAPSULE | ORAL | 0 refills | Status: DC

## 2020-01-23 DIAGNOSIS — M25551 Pain in right hip: Secondary | ICD-10-CM | POA: Diagnosis not present

## 2020-01-25 ENCOUNTER — Encounter: Payer: Self-pay | Admitting: Adult Health

## 2020-01-25 ENCOUNTER — Ambulatory Visit (INDEPENDENT_AMBULATORY_CARE_PROVIDER_SITE_OTHER): Payer: BC Managed Care – PPO | Admitting: Adult Health

## 2020-01-25 ENCOUNTER — Other Ambulatory Visit: Payer: Self-pay

## 2020-01-25 VITALS — BP 122/80 | HR 96 | Temp 84.0°F | Wt 163.0 lb

## 2020-01-25 DIAGNOSIS — R300 Dysuria: Secondary | ICD-10-CM

## 2020-01-25 DIAGNOSIS — Z23 Encounter for immunization: Secondary | ICD-10-CM

## 2020-01-25 DIAGNOSIS — R350 Frequency of micturition: Secondary | ICD-10-CM

## 2020-01-25 DIAGNOSIS — N390 Urinary tract infection, site not specified: Secondary | ICD-10-CM | POA: Diagnosis not present

## 2020-01-25 MED ORDER — NITROFURANTOIN MONOHYD MACRO 100 MG PO CAPS: 100.0000 mg | ORAL_CAPSULE | Freq: Two times a day (BID) | ORAL | 0 refills | Status: DC

## 2020-01-25 NOTE — Progress Notes (Signed)
Assessment and Plan:  Ruth Gregory was seen today for urinary tract infection.  Diagnoses and all orders for this visit:  Dysuria/urgency UTI versus OAB versus vaginal dryness - will check UA, C&S. Will start ABX now due to pain -     nitrofurantoin, macrocrystal-monohydrate, (MACROBID) 100 MG capsule; Take 1 capsule (100 mg total) by mouth 2 (two) times daily.  Need for immunization against influenza -     FLU VACCINE MDCK QUAD W/Preservative  Further disposition pending results of labs. Discussed med's effects and SE's.   Over 15 minutes of exam, counseling, chart review, and critical decision making was performed.   Future Appointments  Date Time Provider Department Center  03/14/2020 10:30 AM Lucky Cowboy, MD [facility] None  04/09/2020  3:00 PM Levert Feinstein, MD [facility] None  09/08/2020 11:00 AM Lucky Cowboy, MD [facility] None    ------------------------------------------------------------------------------------------------------------------   HPI BP 122/80   Pulse 96   Temp (!) 84 F (28.9 C)   Wt 163 lb (73.9 kg)   SpO2 96%   BMI 24.42 kg/m   59 y.o.female presents for evaluation of possible UTI, hx of recurrent since menopause.   She reports has been having dysuria and urinary frequency 3 weeks ago, has been pushing fluid intake hoping to flush, but not resolving, didn't come in due to was getting colonoscopy. Also with urgency and some leaking.  She reports was having dark urine but has resolved, denies cloudiness or odor.   She has had hysterectomy, hx of prolapse, has tacked bladder, follows with GYN and is on vaginal estrogen. She denies vaginal discharge, no perineal burning.  Last UTI in July 2021, resolved with macrobid, culture was negative but suspect r/t was taking azo. She did take azo early in the course but denies in last week.   Past Medical History:  Diagnosis Date  . Anemia   . Anxiety   . Asthma   . Chronic heartburn   . Depression   . Elevated  cholesterol    on Crestor  . Headache(784.0)   . Hypertension   . Hypothyroidism    S/P thyroidectomy  . Migraine   . Occlusion and stenosis of carotid artery with cerebral infarction   . Spasm of muscle   . Unspecified cerebral artery occlusion with cerebral infarction   . Vitamin D deficiency      Allergies  Allergen Reactions  . Penicillins Anaphylaxis and Hives  . Imitrex [Sumatriptan]   . Other Swelling    kiwi  . Sulfa Antibiotics Hives  . Zomig [Zolmitriptan]     Current Outpatient Medications on File Prior to Visit  Medication Sig  . albuterol (PROVENTIL HFA) 108 (90 Base) MCG/ACT inhaler 1 to 2 inhalations 10-15 minutes apart every 4 hours if needed for asthma rescue  . ALPRAZolam (XANAX) 0.5 MG tablet Take 1/2-1 tablet at Bedtime ONLY if needed for Sleep  . amphetamine-dextroamphetamine (ADDERALL) 10 MG tablet Take 1/2 to 1 tablet 1 or 2 x daily for ADD  . aspirin EC 81 MG tablet Take 81 mg by mouth daily.  Marland Kitchen augmented betamethasone dipropionate (DIPROLENE-AF) 0.05 % cream Apply topically 2 (two) times daily as needed.  . Botulinum Toxin Type A (BOTOX) 200 UNITS SOLR Inject 400 Units as directed every 3 (three) months. Change to BOTOX A 100  units  . BREO ELLIPTA 100-25 MCG/INH AEPB USE 1 INHALATION DAILY TO PREVENT ASTHMA  . buPROPion (WELLBUTRIN XL) 150 MG 24 hr tablet TAKE 1 TABLET EVERY MORNING FOR MOOD.  FOCUS & CONCENTRATION  . butalbital-acetaminophen-caffeine (FIORICET) 50-325-40 MG tablet Take 1 tablet by mouth daily as needed for headache. Do not refill in less than 30 days,  . cetirizine (ZYRTEC) 10 MG tablet Take 10 mg by mouth daily.  . Diclofenac Potassium,Migraine, (CAMBIA) 50 MG PACK MIX 1 PACK WITH 1-2 OUNCES OF WATER AND DRINK AT ONSET OF MIGRAINE CAN REPEAT IN 2 HOURS IF NEEDED MAX 2/24 HOURS  . diclofenac Sodium (VOLTAREN) 1 % GEL Apply 4 g topically 4 (four) times daily as needed.  Dorise Hiss (AIMOVIG) 70 MG/ML SOAJ Inject 70 mg into the skin  every 30 (thirty) days.  . Estradiol (YUVAFEM) 10 MCG TABS vaginal tablet Place 10 mcg vaginally 2 (two) times a week.  . fenofibrate micronized (LOFIBRA) 134 MG capsule Take     1 capsule     Daily        for Triglycerides (Blood Fats)  . levothyroxine (SYNTHROID) 88 MCG tablet Take 1 tablet daily on an empty stomach with only water for 30 minutes & no Antacid meds, Calcium or Magnesium for 4 hours & avoid Biotin  . magnesium oxide (MAG-OX) 400 MG tablet Take 400 mg by mouth 2 (two) times daily.   . montelukast (SINGULAIR) 10 MG tablet Take 1 tablet Daily  for Allergies.  . Multiple Vitamin (MULTIVITAMIN WITH MINERALS) TABS tablet Take 1 tablet by mouth daily.  . Multiple Vitamins-Minerals (ZINC PO) Take 1 tablet by mouth daily.  . nortriptyline (PAMELOR) 25 MG capsule Take 25 mg by mouth at bedtime.  Marland Kitchen omeprazole (PRILOSEC) 40 MG capsule TAKE 1 CAPSULE DAILY FOR ACID INDIGESTION & REFLUX  . ondansetron (ZOFRAN ODT) 8 MG disintegrating tablet Dissolve 1 tablet under tongue every 6 to 8 hours for nausea  or vomitting  . Riboflavin 100 MG TABS Take 100 mg by mouth. bid  . rosuvastatin (CRESTOR) 40 MG tablet Take 1 tablet (40 mg total) by mouth daily. Take 1 tablet Daily for Cholesterol  . topiramate (TOPAMAX) 100 MG tablet Take 2 tablets (200 mg total) by mouth daily.  Marland Kitchen Ubrogepant (UBRELVY) 50 MG TABS Take 50 mg by mouth as needed. May repeat once in 2 hours  . verapamil (CALAN-SR) 240 MG CR tablet TAKE 1 TABLET DAILY WITH FOOD FOR BP & MIGRAINE PREVENTION  . vitamin B-12 (CYANOCOBALAMIN) 1000 MCG tablet Take 1,000 mcg by mouth daily.  . Vitamin D, Ergocalciferol, (DRISDOL) 1.25 MG (50000 UT) CAPS capsule Take 1 capsule daily for Vitamin D deficiency.  Marland Kitchen dexamethasone (DECADRON) 4 MG tablet Take 1 tab 3 x day - 3 days, then 2 x day - 3 days, then 1 tab daily   No current facility-administered medications on file prior to visit.    ROS: all negative except above.   Physical Exam:  BP 122/80    Pulse 96   Temp (!) 84 F (28.9 C)   Wt 163 lb (73.9 kg)   SpO2 96%   BMI 24.42 kg/m   General Appearance: Well nourished, in no apparent distress. Eyes: PERRLA, conjunctiva no swelling or erythema ENT/Mouth: Mask in place Hearing normal.  Neck: Supple Respiratory: Respiratory effort normal Cardio: Appears well perfused Abdomen: Soft, + BS.  Non tender, no guarding, rebound, hernias, masses. Lymphatics: Non tender without lymphadenopathy.  Musculoskeletal: normal gait. No CVA tenderness Skin: Warm, dry without rashes, lesions, ecchymosis.  Neuro: Normal muscle tone Psych: Awake and oriented X 3, normal affect, Insight and Judgment appropriate.     Dan Maker, NP  11:04 AM [facility]

## 2020-01-25 NOTE — Patient Instructions (Signed)

## 2020-01-28 DIAGNOSIS — Z6823 Body mass index (BMI) 23.0-23.9, adult: Secondary | ICD-10-CM | POA: Diagnosis not present

## 2020-01-28 DIAGNOSIS — N952 Postmenopausal atrophic vaginitis: Secondary | ICD-10-CM | POA: Diagnosis not present

## 2020-01-28 DIAGNOSIS — Z01419 Encounter for gynecological examination (general) (routine) without abnormal findings: Secondary | ICD-10-CM | POA: Diagnosis not present

## 2020-01-28 LAB — URINALYSIS W MICROSCOPIC + REFLEX CULTURE
Bilirubin Urine: NEGATIVE
Glucose, UA: NEGATIVE
Hgb urine dipstick: NEGATIVE
Hyaline Cast: NONE SEEN /LPF
Ketones, ur: NEGATIVE
Nitrites, Initial: POSITIVE — AB
Protein, ur: NEGATIVE
RBC / HPF: NONE SEEN /HPF (ref 0–2)
Specific Gravity, Urine: 1.006 (ref 1.001–1.03)
pH: 6.5 (ref 5.0–8.0)

## 2020-01-28 LAB — URINE CULTURE
MICRO NUMBER:: 11140216
SPECIMEN QUALITY:: ADEQUATE

## 2020-01-28 LAB — CULTURE INDICATED

## 2020-01-31 ENCOUNTER — Other Ambulatory Visit: Payer: Self-pay | Admitting: Adult Health

## 2020-01-31 DIAGNOSIS — N390 Urinary tract infection, site not specified: Secondary | ICD-10-CM

## 2020-01-31 MED ORDER — CIPROFLOXACIN HCL 500 MG PO TABS: 500.0000 mg | ORAL_TABLET | Freq: Two times a day (BID) | ORAL | 0 refills | Status: AC

## 2020-02-15 ENCOUNTER — Other Ambulatory Visit: Payer: Self-pay | Admitting: Neurology

## 2020-02-26 DIAGNOSIS — M542 Cervicalgia: Secondary | ICD-10-CM | POA: Diagnosis not present

## 2020-02-28 ENCOUNTER — Telehealth: Payer: Self-pay | Admitting: Neurology

## 2020-02-28 DIAGNOSIS — M542 Cervicalgia: Secondary | ICD-10-CM | POA: Diagnosis not present
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
## 2020-02-28 NOTE — Telephone Encounter (Signed)
Pt's husband called wanting to speak to the RN regarding the pt's Ubrogepant (UBRELVY) 50 MG TABS Please advise.

## 2020-03-05 ENCOUNTER — Encounter: Payer: Self-pay | Admitting: *Deleted

## 2020-03-06 DIAGNOSIS — M542 Cervicalgia: Secondary | ICD-10-CM | POA: Diagnosis not present

## 2020-03-10 ENCOUNTER — Other Ambulatory Visit: Payer: Self-pay | Admitting: Internal Medicine

## 2020-03-10 DIAGNOSIS — F988 Other specified behavioral and emotional disorders with onset usually occurring in childhood and adolescence: Secondary | ICD-10-CM

## 2020-03-10 MED ORDER — AMPHETAMINE-DEXTROAMPHETAMINE 10 MG PO TABS: ORAL_TABLET | ORAL | 0 refills | Status: DC

## 2020-03-11 DIAGNOSIS — M542 Cervicalgia: Secondary | ICD-10-CM | POA: Diagnosis not present

## 2020-03-13 ENCOUNTER — Encounter: Payer: Self-pay | Admitting: Internal Medicine

## 2020-03-13 DIAGNOSIS — Z13820 Encounter for screening for osteoporosis: Secondary | ICD-10-CM | POA: Diagnosis not present

## 2020-03-13 LAB — HM DEXA SCAN

## 2020-03-13 NOTE — Patient Instructions (Signed)

## 2020-03-13 NOTE — Progress Notes (Signed)
History of Present Illness:       This very nice 59 y.o.  MWF presents for 6 month follow up with HTN, HLD, Pre-Diabetes and Vitamin D Deficiency. Patient has GERD controlled on her meds.      Patient is treated for HTN (2009)  when she had a Rt MCA CVA consequent of a Rt Carotid Artery dissection attributed to Fibromuscular Dysplasia.  Patient has a residual mild Left spastic HP followed by Dr Terrace Arabia and treated with Botox injections and also Dr Terrace Arabia manages patients chronic migraines.  Since patient's stroke she has been treated with Welbutrin & low dose Adderall for ADD with improved focus & concentration.  BP has been controlled at home. Today's BP is at goal - 126/84. Patient has had no complaints of any cardiac type chest pain, palpitations, dyspnea / orthopnea / PND, dizziness, claudication, or dependent edema.      Hyperlipidemia is controlled with diet & meds. Patient denies myalgias or other med SE's. Last Lipids were at goal except elevated Trig's:  Lab Results  Component Value Date   CHOL 163 12/13/2019   HDL 52 12/13/2019   LDLCALC 73 12/13/2019   TRIG 319 (H) 12/13/2019   CHOLHDL 3.1 12/13/2019    Also, the patient has history of PreDiabetes (A1c 5.8% /2008) and has had no symptoms of reactive hypoglycemia, diabetic polys, paresthesias or visual blurring.  Last A1c was normal & at goal:  Lab Results  Component Value Date   HGBA1C 5.4 09/06/2019                           Patient has been on Thyroid replacement since Thyroid surgery for goiter in 2007.       Further, the patient also has history of Vitamin D Deficiency ("23" /2008) and supplements vitamin D without any suspected side-effects. Last vitamin D was at goal:  Lab Results  Component Value Date   VD25OH 76 09/06/2019    Current Outpatient Medications on File Prior to Visit  Medication Sig  . albuterol HFA inhaler 1 to 2 inhalations 10-15 min apart every 4 hours if needed   . ALPRAZolam  0.5 MG tablet  Take 1/2-1 tablet at Bedtime ONLY if needed   . ADDERALL 10 MG tablet Take      1/2 - 1 tablet       1 or 2 x /day       for ADD  . aspirin EC 81 MG tablet Take  daily.  Marland Kitchen DIPROLENE-AF 0.05 % crm Apply topically 2  times daily as needed.  Marland Kitchen BOTOX  200 UNITS  Inject 400 Units  every 3  months.   Marland Kitchen BREO ELLIPTA 100-25  USE 1 INHALATION DAILY   . buPROPion -XL) 150 MG  TAKE 1 TABLET EVERY MORNING   . FIORICET Take 1 tablet  daily as needed   . cetirizine  10 MG  Take 1 daily.  Marland Kitchen CAMBIA 50 MG PACK MIX 1 PACK WITH  WATER For MIGRAINE   . diclofenac  1 % GEL Apply 4 g topically 4  times daily as needed.  Marland Kitchen AIMOVIG 70 MG/ML  Inject every 30  days.  . Estradiol 10 MCG  vaginal tablet Place 10 mcg vaginally 2  times a week.  . fenofibrate 134 MG  Take     1 capsule     Daily        .  levothyroxine  88 MCG tablet Take 1 tablet daily   . magnesium oxide  400 MG tablet Take  2  times daily.   . Montelukast 10 MG tablet Take 1 tablet Daily  . Multi-Vit  w/Minerals Take 1 tablet by mouth daily.  Marland Kitchen ZINC  Take 1 tablet by mouth daily.  . nortriptyline  25 MG capsule Take 1 capsule  at bedtime.  Marland Kitchen omeprazole 40 MG c TAKE 1 CAPSULE DAILY   . ondansetron ODT  8 MG   Dissolve under tongue every 6-8 hrs for nausea    . Riboflavin 100 MG TABS Take bid  . rosuvastatin  40 MG tablet Take 1 tablet  daily  . topiramate  100 MG tablet Take 2 tablets  daily.  Marland Kitchen UBRELVY) 50 MG TABS Take 50 mg as needed. May repeat once in 2 hours  . verapamil-SR 240 MG  TAKE 1 TABLET DAILY WITH FOOD  . vitamin B-12  1000 MCG tabl Take  daily.  . Vitamin D 50,000 u Take 1 capsule daily for Vitamin D deficiency.    Allergies  Allergen Reactions  . Penicillins Anaphylaxis and Hives  . Imitrex [Sumatriptan]   . Other Swelling    kiwi  . Sulfa Antibiotics Hives  . Zomig [Zolmitriptan]     PMHx:   Past Medical History:  Diagnosis Date  . Anemia   . Anxiety   . Asthma   . Chronic heartburn   . Depression   . Elevated  cholesterol    on Crestor  . Headache(784.0)   . Hypertension   . Hypothyroidism    S/P thyroidectomy  . Migraine   . Occlusion and stenosis of carotid artery with cerebral infarction   . Spasm of muscle   . Unspecified cerebral artery occlusion with cerebral infarction   . Vitamin D deficiency     Immunization History  Administered Date(s) Administered  . Influenza Inj Mdck Quad With Preservative 01/10/2019, 01/25/2020  . Influenza Split 01/24/2014  . Influenza, Seasonal, Injecte, Preservative Fre 02/09/2016  . Influenza-Unspecified 11/28/2011  . PFIZER SARS-COV-2 Vaccination 06/11/2019, 07/04/2019  . PPD Test 04/04/2013, 07/27/2018  . Pneumococcal Polysaccharide-23 03/29/1998  . Td 03/29/2002  . Tdap 05/18/2016    Past Surgical History:  Procedure Laterality Date  . AUGMENTATION MAMMAPLASTY Bilateral 2000  . COSMETIC SURGERY    . CYSTOCELE REPAIR N/A 01/08/2013   Procedure: ANTERIOR REPAIR (CYSTOCELE);  Surgeon: Meriel Pica, MD;  Location: [facility];  Service: Gynecology;  Laterality: N/A;  . NASAL SINUS SURGERY    . VAGINAL HYSTERECTOMY      FHx:    Reviewed / unchanged  SHx:    Reviewed / unchanged   Systems Review:  Constitutional: Denies fever, chills, wt changes, headaches, insomnia, fatigue, night sweats, change in appetite. Eyes: Denies redness, blurred vision, diplopia, discharge, itchy, watery eyes.  ENT: Denies discharge, congestion, post nasal drip, epistaxis, sore throat, earache, hearing loss, dental pain, tinnitus, vertigo, sinus pain, snoring.  CV: Denies chest pain, palpitations, irregular heartbeat, syncope, dyspnea, diaphoresis, orthopnea, PND, claudication or edema. Respiratory: denies cough, dyspnea, DOE, pleurisy, hoarseness, laryngitis, wheezing.  Gastrointestinal: Denies dysphagia, odynophagia, heartburn, reflux, water brash, abdominal pain or cramps, nausea, vomiting, bloating, diarrhea, constipation, hematemesis, melena, hematochezia  or  hemorrhoids. Genitourinary: Denies dysuria, frequency, urgency, nocturia, hesitancy, discharge, hematuria or flank pain. Musculoskeletal: Denies arthralgias, myalgias, stiffness, jt. swelling, pain, limping or strain/sprain.  Skin: Denies pruritus, rash, hives, warts, acne, eczema or change in skin lesion(s). Neuro: No  weakness, tremor, incoordination, spasms, paresthesia or pain. Psychiatric: Denies confusion, memory loss or sensory loss. Endo: Denies change in weight, skin or hair change.  Heme/Lymph: No excessive bleeding, bruising or enlarged lymph nodes.  Physical Exam  BP 126/84   Pulse 68   Temp (!) 97.5 F (36.4 C)   Wt 164 lb (74.4 kg)   SpO2 99%   BMI 24.57 kg/m   Appears  well nourished, well groomed  and in no distress.  Eyes: PERRLA, EOMs, conjunctiva no swelling or erythema. Sinuses: No frontal/maxillary tenderness ENT/Mouth: EAC's clear, TM's nl w/o erythema, bulging. Nares clear w/o erythema, swelling, exudates. Oropharynx clear without erythema or exudates. Oral hygiene is good. Tongue normal, non obstructing. Hearing intact.  Neck: Supple. Thyroid not palpable. Car 2+/2+ without bruits, nodes or JVD. Chest: Respirations nl with BS clear & equal w/o rales, rhonchi, wheezing or stridor.  Cor: Heart sounds normal w/ regular rate and rhythm without sig. murmurs, gallops, clicks or rubs. Peripheral pulses normal and equal  without edema.  Abdomen: Soft & bowel sounds normal. Non-tender w/o guarding, rebound, hernias, masses or organomegaly.  Lymphatics: Unremarkable.  Musculoskeletal: Full ROM all peripheral extremities, joint stability, 5/5 strength and normal gait.  Skin: Warm, dry without exposed rashes, lesions or ecchymosis apparent.  Neuro: Cranial nerves intact, reflexes equal bilaterally. Sensory-motor testing grossly intact. Tendon reflexes grossly intact.  Pysch: Alert & oriented x 3.  Insight and judgement nl & appropriate. No ideations.  Assessment and  Plan:  1. Essential hypertension  - Continue medication, monitor blood pressure at home.  - Continue DASH diet.  Reminder to go to the ER if any CP,  SOB, nausea, dizziness, severe HA, changes vision/speech.  - CBC with Differential/Platelet - COMPLETE METABOLIC PANEL WITH GFR - Magnesium - TSH  2. Hyperlipidemia, mixed  - Continue diet/meds, exercise,& lifestyle modifications.  - Continue monitor periodic cholesterol/liver & renal functions   - Lipid panel - TSH  3. Abnormal glucose  - Continue diet, exercise  - Lifestyle modifications.  - Monitor appropriate labs.  - Hemoglobin A1c - Insulin, random  4. Acquired hypothyroidism  - TSH  5. Vitamin D deficiency  - Continue supplementation.  - VITAMIN D 25 Hydroxy   6. ADD (attention deficit disorder) without hyperactivity   7. Medication management  - CBC with Differential/Platelet - COMPLETE METABOLIC PANEL WITH GFR - Magnesium - Lipid panel - TSH - Hemoglobin A1c - Insulin, random - VITAMIN D 25 Hydroxy        Discussed  regular exercise, BP monitoring, weight control to achieve/maintain BMI less than 25 and discussed med and SE's. Recommended labs to assess and monitor clinical status with further disposition pending results of labs.  I discussed the assessment and treatment plan with the patient. The patient was provided an opportunity to ask questions and all were answered. The patient agreed with the plan and demonstrated an understanding of the instructions.  I provided over 30 minutes of exam, counseling, chart review and  complex critical decision making.   Marinus Maw, MD

## 2020-03-14 ENCOUNTER — Ambulatory Visit (INDEPENDENT_AMBULATORY_CARE_PROVIDER_SITE_OTHER): Payer: BC Managed Care – PPO | Admitting: Internal Medicine

## 2020-03-14 ENCOUNTER — Encounter: Payer: Self-pay | Admitting: Internal Medicine

## 2020-03-14 ENCOUNTER — Other Ambulatory Visit: Payer: Self-pay

## 2020-03-14 VITALS — BP 126/84 | HR 68 | Temp 97.5°F | Ht 68.5 in | Wt 164.0 lb

## 2020-03-14 DIAGNOSIS — R7309 Other abnormal glucose: Secondary | ICD-10-CM | POA: Diagnosis not present

## 2020-03-14 DIAGNOSIS — E782 Mixed hyperlipidemia: Secondary | ICD-10-CM

## 2020-03-14 DIAGNOSIS — Z79899 Other long term (current) drug therapy: Secondary | ICD-10-CM

## 2020-03-14 DIAGNOSIS — E559 Vitamin D deficiency, unspecified: Secondary | ICD-10-CM | POA: Diagnosis not present

## 2020-03-14 DIAGNOSIS — I10 Essential (primary) hypertension: Secondary | ICD-10-CM | POA: Diagnosis not present

## 2020-03-14 DIAGNOSIS — E039 Hypothyroidism, unspecified: Secondary | ICD-10-CM | POA: Diagnosis not present

## 2020-03-14 DIAGNOSIS — F988 Other specified behavioral and emotional disorders with onset usually occurring in childhood and adolescence: Secondary | ICD-10-CM

## 2020-03-15 ENCOUNTER — Encounter: Payer: Self-pay | Admitting: Internal Medicine

## 2020-03-15 NOTE — Progress Notes (Signed)
========================================================== -   Test results slightly outside the reference range are not unusual. If there is anything important, I will review this with you,  otherwise it is considered normal test values.  If you have further questions,  please do not hesitate to contact me at the office or via My Chart.  ==========================================================  -  Total  Chol = 173      and       LDL Chol = 83 - Both  Excellent   - Very low risk for Heart Attack  / Stroke ==========================================================  - but Triglycerides (   312    ) or fats in blood are too high  (goal is less than 150)    - Please be sure taking your Fenofibrate to lower Triglycerides (Blood Fats)   - Recommend avoid fried & greasy foods,  sweets / candy,   - Avoid white rice  (brown or wild rice or Quinoa is OK),   - Avoid white potatoes  (sweet potatoes are OK)   - Avoid anything made from white flour  - bagels, doughnuts, rolls, buns, biscuits, white and   wheat breads, pizza crust and traditional  pasta made of white flour & egg white  - (vegetarian pasta or spinach or wheat pasta is OK).    - Multi-grain bread is OK - like multi-grain flat bread or  sandwich thins.   - Avoid alcohol in excess.   - Exercise is also important. ==========================================================  -  A1c = 5.7% - is borderline again   -  elevated in the early or pre-diabetes range which has the same   300% increased risk for heart attack, stroke, cancer and   alzheimer- type vascular dementia as full blown diabetes.   But the good news is that diet, exercise with  weight loss can cure the early diabetes at this point.  -  It is very important that you work harder with diet by  avoiding all foods that are white except chicken,   fish & calliflower.  - Avoid white rice  (brown & wild rice is OK),   - Avoid white potatoes  (sweet  potatoes in moderation is OK),   White bread or wheat bread or anything made out of   white flour like bagels, donuts, rolls, buns, biscuits, cakes,  - pastries, cookies, pizza crust, and pasta (made from  white flour & egg whites)   - vegetarian pasta or spinach or wheat pasta is OK.  - Multigrain breads like Arnold's, Pepperidge Farm or   multigrain sandwich thins or high fiber breads like   [place]ka bread or "Dave's Killer" breads that are  4 to 5 grams fiber per slice !  are best.    Diet, exercise and weight loss can reverse and cure  diabetes in the early stages.   ==========================================================  -  Vitamin D = 82  - Excellent  ==========================================================  -  All Else - CBC - Kidneys - Electrolytes - Liver - Magnesium & Thyroid    - all  Normal / OK ==========================================================

## 2020-03-17 LAB — COMPLETE METABOLIC PANEL WITH GFR
AG Ratio: 2 (calc) (ref 1.0–2.5)
ALT: 31 U/L — ABNORMAL HIGH (ref 6–29)
AST: 20 U/L (ref 10–35)
Albumin: 4.2 g/dL (ref 3.6–5.1)
Alkaline phosphatase (APISO): 88 U/L (ref 37–153)
BUN: 10 mg/dL (ref 7–25)
CO2: 28 mmol/L (ref 20–32)
Calcium: 9.6 mg/dL (ref 8.6–10.4)
Chloride: 106 mmol/L (ref 98–110)
Creat: 0.85 mg/dL (ref 0.50–0.99)
GFR, Est African American: 86 mL/min/{1.73_m2} (ref >=60)
GFR, Est Non African American: 74 mL/min/{1.73_m2} (ref >=60)
Globulin: 2.1 g/dL (calc) (ref 1.9–3.7)
Glucose, Bld: 92 mg/dL (ref 65–99)
Potassium: 4 mmol/L (ref 3.5–5.3)
Sodium: 141 mmol/L (ref 135–146)
Total Bilirubin: 0.5 mg/dL (ref 0.2–1.2)
Total Protein: 6.3 g/dL (ref 6.1–8.1)

## 2020-03-17 LAB — LIPID PANEL
Cholesterol: 173 mg/dL (ref <200)
HDL: 52 mg/dL (ref >=50)
LDL Cholesterol (Calc): 83 mg/dL (calc)
Non-HDL Cholesterol (Calc): 121 mg/dL (calc) (ref <130)
Total CHOL/HDL Ratio: 3.3 (calc) (ref <5.0)
Triglycerides: 312 mg/dL — ABNORMAL HIGH (ref <150)

## 2020-03-17 LAB — CBC WITH DIFFERENTIAL/PLATELET
Absolute Monocytes: 310 cells/uL (ref 200–950)
Basophils Absolute: 28 cells/uL (ref 0–200)
Basophils Relative: 0.6 %
Eosinophils Absolute: 141 cells/uL (ref 15–500)
Eosinophils Relative: 3 %
HCT: 44.9 % (ref 35.0–45.0)
Hemoglobin: 15.5 g/dL (ref 11.7–15.5)
Lymphs Abs: 1838 cells/uL (ref 850–3900)
MCH: 31.8 pg (ref 27.0–33.0)
MCHC: 34.5 g/dL (ref 32.0–36.0)
MCV: 92.2 fL (ref 80.0–100.0)
MPV: 10.9 fL (ref 7.5–12.5)
Monocytes Relative: 6.6 %
Neutro Abs: 2383 cells/uL (ref 1500–7800)
Neutrophils Relative %: 50.7 %
Platelets: 172 10*3/uL (ref 140–400)
RBC: 4.87 10*6/uL (ref 3.80–5.10)
RDW: 12.4 % (ref 11.0–15.0)
Total Lymphocyte: 39.1 %
WBC: 4.7 10*3/uL (ref 3.8–10.8)

## 2020-03-17 LAB — HEMOGLOBIN A1C
Hgb A1c MFr Bld: 5.7 % of total Hgb — ABNORMAL HIGH (ref <5.7)
Mean Plasma Glucose: 117 mg/dL
eAG (mmol/L): 6.5 mmol/L

## 2020-03-17 LAB — INSULIN, RANDOM: Insulin: 23 u[IU]/mL — ABNORMAL HIGH

## 2020-03-17 LAB — TSH: TSH: 0.57 mIU/L (ref 0.40–4.50)

## 2020-03-17 LAB — MAGNESIUM: Magnesium: 2.1 mg/dL (ref 1.5–2.5)

## 2020-03-17 LAB — VITAMIN D 25 HYDROXY (VIT D DEFICIENCY, FRACTURES): Vit D, 25-Hydroxy: 82 ng/mL (ref 30–100)

## 2020-03-19 ENCOUNTER — Telehealth: Payer: Self-pay | Admitting: Neurology

## 2020-03-19 MED ORDER — UBRELVY 100 MG PO TABS: 100.0000 mg | ORAL_TABLET | ORAL | 11 refills | Status: DC | PRN

## 2020-03-19 NOTE — Telephone Encounter (Signed)
Meds ordered this encounter  Medications  . Ubrogepant (UBRELVY) 100 MG TABS    Sig: Take 100 mg by mouth as needed.    Dispense:  12 tablet    Refill:  11    I increase the ubrelvy dose from 50 to 100mg  as needed. 12 tablets each month (how much her insurance would allow?, it is ok to give her uplimit of her insurance allowance)

## 2020-03-19 NOTE — Addendum Note (Signed)
Addended by: Levert Feinstein on: 03/19/2020 10:15 AM   Modules accepted: Orders

## 2020-03-19 NOTE — Telephone Encounter (Signed)
I spoke to the patient. She is agreeable to try Ubrelvy 100mg . States #12 tablets should work for the month since it is a higher dosage. I called the pharmacy and voided the remaining refills on file for the 50mg  tablets.   She never started the Aimovig and does not wish to move forward with it. She wants to keep her current medication regimen. Feels the increase in migraines is related to stress caused by this time of year and weather changes.

## 2020-03-19 NOTE — Addendum Note (Signed)
Addended by: Lindell Spar C on: 03/19/2020 10:34 AM   Modules accepted: Orders

## 2020-03-19 NOTE — Telephone Encounter (Signed)
Pt called, can the physician increase number of pills for Ubrogepant (UBRELVY) 50 MG TABS. Having more migraines. Would like a call from the nurse.

## 2020-03-21 ENCOUNTER — Encounter (HOSPITAL_BASED_OUTPATIENT_CLINIC_OR_DEPARTMENT_OTHER): Payer: Self-pay

## 2020-03-21 ENCOUNTER — Emergency Department (HOSPITAL_BASED_OUTPATIENT_CLINIC_OR_DEPARTMENT_OTHER): Payer: BC Managed Care – PPO

## 2020-03-21 ENCOUNTER — Emergency Department (HOSPITAL_BASED_OUTPATIENT_CLINIC_OR_DEPARTMENT_OTHER)
Admission: EM | Admit: 2020-03-21 | Discharge: 2020-03-21 | Disposition: A | Discharge status: Home / Self Care | Payer: BC Managed Care – PPO | Attending: Emergency Medicine | Admitting: Emergency Medicine

## 2020-03-21 ENCOUNTER — Other Ambulatory Visit: Payer: Self-pay

## 2020-03-21 DIAGNOSIS — M25422 Effusion, left elbow: Secondary | ICD-10-CM | POA: Diagnosis not present

## 2020-03-21 DIAGNOSIS — W010XXA Fall on same level from slipping, tripping and stumbling without subsequent striking against object, initial encounter: Secondary | ICD-10-CM | POA: Diagnosis not present

## 2020-03-21 DIAGNOSIS — Y92007 Garden or yard of unspecified non-institutional (private) residence as the place of occurrence of the external cause: Secondary | ICD-10-CM | POA: Diagnosis not present

## 2020-03-21 DIAGNOSIS — E039 Hypothyroidism, unspecified: Secondary | ICD-10-CM | POA: Diagnosis not present

## 2020-03-21 DIAGNOSIS — M25562 Pain in left knee: Secondary | ICD-10-CM | POA: Diagnosis not present

## 2020-03-21 DIAGNOSIS — J45909 Unspecified asthma, uncomplicated: Secondary | ICD-10-CM | POA: Insufficient documentation

## 2020-03-21 DIAGNOSIS — Z7982 Long term (current) use of aspirin: Secondary | ICD-10-CM | POA: Diagnosis not present

## 2020-03-21 DIAGNOSIS — I10 Essential (primary) hypertension: Secondary | ICD-10-CM | POA: Diagnosis not present

## 2020-03-21 DIAGNOSIS — Z79899 Other long term (current) drug therapy: Secondary | ICD-10-CM | POA: Diagnosis not present

## 2020-03-21 DIAGNOSIS — M25532 Pain in left wrist: Secondary | ICD-10-CM | POA: Diagnosis not present

## 2020-03-21 DIAGNOSIS — S52125A Nondisplaced fracture of head of left radius, initial encounter for closed fracture: Secondary | ICD-10-CM | POA: Diagnosis not present

## 2020-03-21 DIAGNOSIS — Z8673 Personal history of transient ischemic attack (TIA), and cerebral infarction without residual deficits: Secondary | ICD-10-CM | POA: Insufficient documentation

## 2020-03-21 DIAGNOSIS — S4992XA Unspecified injury of left shoulder and upper arm, initial encounter: Secondary | ICD-10-CM | POA: Diagnosis not present

## 2020-03-21 DIAGNOSIS — S60222A Contusion of left hand, initial encounter: Secondary | ICD-10-CM | POA: Diagnosis not present

## 2020-03-21 DIAGNOSIS — Z043 Encounter for examination and observation following other accident: Secondary | ICD-10-CM | POA: Diagnosis not present

## 2020-03-21 DIAGNOSIS — W19XXXA Unspecified fall, initial encounter: Secondary | ICD-10-CM

## 2020-03-21 DIAGNOSIS — S52302A Unspecified fracture of shaft of left radius, initial encounter for closed fracture: Secondary | ICD-10-CM | POA: Diagnosis not present

## 2020-03-21 MED ORDER — IBUPROFEN 800 MG PO TABS
800.0000 mg | ORAL_TABLET | Freq: Once | ORAL | Status: AC
  Administered 2020-03-21: 800 mg via ORAL
  Filled 2020-03-21: qty 1

## 2020-03-21 MED ORDER — HYDROCODONE-ACETAMINOPHEN 5-325 MG PO TABS: 1.0000 | ORAL_TABLET | ORAL | 0 refills | Status: DC | PRN

## 2020-03-21 NOTE — ED Triage Notes (Addendum)
Pt arrives ambulatory following fall yesterday afternoon. Pt arrives in homemade sling with bruising to center of left hand. Pt reports she is unable to raise her arm since fall has residual numbness after a stroke 10 years ago. Pt states that she did not hit her head, no LOC, not on a blood thinner.

## 2020-03-21 NOTE — ED Provider Notes (Signed)
[facility] EMERGENCY DEPARTMENT Provider Note   CSN: [id number] Arrival date & time: 03/21/20  1018     History Chief Complaint  Patient presents with  . Fall  . Arm Pain    Ruth Gregory is a 59 y.o. female.  Pt presents to the ED today with left arm and knee pain after a fall.  Pt said she tripped in the garden and fell.  She had a prior stroke affecting the left side and does not feel very well on that side, but had good rom until she fell.  Now, it hurts in her left elbow and she can't life her left arm.  She denies loc. She's been able to walk. No blood thinners.          Past Medical History:  Diagnosis Date  . Anemia   . Anxiety   . Asthma   . Chronic heartburn   . Depression   . Elevated cholesterol    on Crestor  . Headache(784.0)   . Hypertension   . Hypothyroidism    S/P thyroidectomy  . Migraine   . Occlusion and stenosis of carotid artery with cerebral infarction   . Spasm of muscle   . Unspecified cerebral artery occlusion with cerebral infarction   . Vitamin D deficiency     Patient Active Problem List   Diagnosis Date Noted  . Spastic hemiplegia of left nondominant side as late effect of cerebrovascular disease (HCC) 06/18/2019  . Abnormal glucose 07/27/2018  . Labile hypertension 07/27/2018  . Fatty liver 10/09/2017  . GERD (gastroesophageal reflux disease) 10/07/2017  . Anxiety 10/07/2017  . Hypothyroidism 07/23/2015  . Vitamin D deficiency 10/03/2013  . Medication management 10/03/2013  . History of prediabetes 10/03/2013  . ADD (attention deficit disorder) without hyperactivity 10/03/2013  . Hyperlipidemia, mixed 04/04/2013  . Sinusitis, chronic 04/04/2013  . Chronic migraine 09/06/2012  . Spastic hemiplegia affecting left nondominant side (HCC) 09/06/2012  . History of CVA (cerebrovascular accident)     Past Surgical History:  Procedure Laterality Date  . AUGMENTATION MAMMAPLASTY Bilateral 2000  . COSMETIC  SURGERY    . CYSTOCELE REPAIR N/A 01/08/2013   Procedure: ANTERIOR REPAIR (CYSTOCELE);  Surgeon: Meriel Pica, MD;  Location: [facility];  Service: Gynecology;  Laterality: N/A;  . NASAL SINUS SURGERY    . VAGINAL HYSTERECTOMY       OB History   No obstetric history on file.     Family History  Problem Relation Age of Onset  . Heart disease Mother   . Peptic Ulcer Disease Mother   . Heart disease Father   . Parkinson's disease Father     Social History   Tobacco Use  . Smoking status: Never Smoker  . Smokeless tobacco: Never Used  Substance Use Topics  . Alcohol use: Yes    Comment: occassionally drinks a glass of wine  . Drug use: No    Home Medications Prior to Admission medications   Medication Sig Start Date End Date Taking? Authorizing Provider  albuterol (PROVENTIL HFA) 108 (90 Base) MCG/ACT inhaler 1 to 2 inhalations 10-15 minutes apart every 4 hours if needed for asthma rescue 06/12/17   Lucky Cowboy, MD  ALPRAZolam Prudy Feeler) 0.5 MG tablet Take 1/2-1 tablet at Bedtime ONLY if needed for Sleep 12/13/19   Doree Albee, PA-C  amphetamine-dextroamphetamine (ADDERALL) 10 MG tablet Take      1/2 - 1 tablet       1 or 2 x /  day       for ADD 03/10/20 05/03/20  Lucky Cowboy, MD  aspirin EC 81 MG tablet Take 81 mg by mouth daily.    [provider]  augmented betamethasone dipropionate (DIPROLENE-AF) 0.05 % cream Apply topically 2 (two) times daily as needed. 08/20/19   [provider]  Botulinum Toxin Type A 200 units SOLR Inject 400 Units as directed every 3 (three) months. Change to BOTOX A 100  units    [provider]  BREO ELLIPTA 100-25 MCG/INH AEPB USE 1 INHALATION DAILY TO PREVENT ASTHMA 01/22/20   Lucky Cowboy, MD  buPROPion (WELLBUTRIN XL) 150 MG 24 hr tablet TAKE 1 TABLET EVERY MORNING FOR MOOD. FOCUS & CONCENTRATION 01/05/20   Lucky Cowboy, MD  butalbital-acetaminophen-caffeine (FIORICET) [phone] MG tablet Take 1 tablet  by mouth daily as needed for headache. Do not refill in less than 30 days, 09/27/19   Levert Feinstein, MD  cetirizine (ZYRTEC) 10 MG tablet Take 10 mg by mouth daily.    [provider]  Diclofenac Potassium,Migraine, (CAMBIA) 50 MG PACK MIX 1 PACK WITH 1-2 OUNCES OF WATER AND DRINK AT ONSET OF MIGRAINE CAN REPEAT IN 2 HOURS IF NEEDED MAX 2/24 HOURS 08/16/19   Levert Feinstein, MD  diclofenac Sodium (VOLTAREN) 1 % GEL Apply 4 g topically 4 (four) times daily as needed. 09/27/19   Hilts, Casimiro Needle, MD  Estradiol 10 MCG TABS vaginal tablet Place 10 mcg vaginally 2 (two) times a week.    [provider]  fenofibrate micronized (LOFIBRA) 134 MG capsule Take     1 capsule     Daily        for Triglycerides (Blood Fats) 01/22/20   Lucky Cowboy, MD  HYDROcodone-acetaminophen (NORCO/VICODIN) 5-325 MG tablet Take 1 tablet by mouth every 4 (four) hours as needed. 03/21/20   Jacalyn Lefevre, MD  levothyroxine (SYNTHROID) 88 MCG tablet Take 1 tablet daily on an empty stomach with only water for 30 minutes & no Antacid meds, Calcium or Magnesium for 4 hours & avoid Biotin 12/19/19   Lucky Cowboy, MD  magnesium oxide (MAG-OX) 400 MG tablet Take 400 mg by mouth 2 (two) times daily.     [provider]  montelukast (SINGULAIR) 10 MG tablet Take 1 tablet Daily  for Allergies. 04/01/19   Lucky Cowboy, MD  Multiple Vitamin (MULTIVITAMIN WITH MINERALS) TABS tablet Take 1 tablet by mouth daily.    [provider]  Multiple Vitamins-Minerals (ZINC PO) Take 1 tablet by mouth daily.    [provider]  nortriptyline (PAMELOR) 25 MG capsule Take 1 capsule (25 mg total) by mouth at bedtime. 02/18/20   Levert Feinstein, MD  omeprazole (PRILOSEC) 40 MG capsule TAKE 1 CAPSULE DAILY FOR ACID INDIGESTION & REFLUX 12/28/19   Lucky Cowboy, MD  ondansetron (ZOFRAN ODT) 8 MG disintegrating tablet Dissolve 1 tablet under tongue every 6 to 8 hours for nausea  or vomitting 10/16/19   Lucky Cowboy, MD   Riboflavin 100 MG TABS Take 100 mg by mouth. bid    [provider]  rosuvastatin (CRESTOR) 40 MG tablet Take 1 tablet (40 mg total) by mouth daily. Take 1 tablet Daily for Cholesterol 04/01/19   Lucky Cowboy, MD  topiramate (TOPAMAX) 100 MG tablet Take 2 tablets (200 mg total) by mouth daily. 06/28/19   Levert Feinstein, MD  Ubrogepant (UBRELVY) 100 MG TABS Take 100 mg by mouth as needed. 03/19/20   Levert Feinstein, MD  verapamil (CALAN-SR) 240 MG CR  tablet TAKE 1 TABLET DAILY WITH FOOD FOR BP & MIGRAINE PREVENTION 11/19/19   Judd Gaudierorbett, Ashley, NP  vitamin B-12 (CYANOCOBALAMIN) 1000 MCG tablet Take 1,000 mcg by mouth daily.    [provider]  Vitamin D, Ergocalciferol, (DRISDOL) 1.25 MG (50000 UT) CAPS capsule Take 1 capsule daily for Vitamin D deficiency. 01/12/19   Lucky CowboyMcKeown, William, MD    Allergies    Penicillins, Imitrex [sumatriptan], Other, Sulfa antibiotics, and Zomig [zolmitriptan]  Review of Systems   Review of Systems  Musculoskeletal:       Left hand, left elbow, left shoulder, left wrist, left knee tenderness  All other systems reviewed and are negative.   Physical Exam Updated Vital Signs BP (!) 135/97 (BP Location: Right Arm)   Pulse (!) 101   Temp 98.5 F (36.9 C)   Resp 18   Ht 5' 8.5" (1.74 m)   Wt 74.4 kg   SpO2 95%   BMI 24.57 kg/m   Physical Exam Vitals and nursing note reviewed.  Constitutional:      Appearance: Normal appearance.  HENT:     Head: Normocephalic and atraumatic.     Right Ear: External ear normal.     Left Ear: External ear normal.     Nose: Nose normal.     Mouth/Throat:     Mouth: Mucous membranes are moist.     Pharynx: Oropharynx is clear.  Eyes:     Extraocular Movements: Extraocular movements intact.     Conjunctiva/sclera: Conjunctivae normal.     Pupils: Pupils are equal, round, and reactive to light.  Cardiovascular:     Rate and Rhythm: Normal rate and regular rhythm.     Pulses: Normal pulses.     Heart sounds:  Normal heart sounds.  Pulmonary:     Effort: Pulmonary effort is normal.     Breath sounds: Normal breath sounds.  Abdominal:     General: Abdomen is flat. Bowel sounds are normal.     Palpations: Abdomen is soft.  Musculoskeletal:     Left shoulder: Decreased range of motion.     Left elbow: Swelling present. Decreased range of motion. Tenderness present.     Left wrist: Tenderness present.     Cervical back: Normal range of motion and neck supple.     Left knee: Tenderness present.  Skin:    General: Skin is warm.     Capillary Refill: Capillary refill takes less than 2 seconds.  Neurological:     General: No focal deficit present.     Mental Status: She is alert and oriented to person, place, and time.  Psychiatric:        Mood and Affect: Mood normal.        Behavior: Behavior normal.     ED Results / Procedures / Treatments   Labs (all labs ordered are listed, but only abnormal results are displayed) Labs Reviewed - No data to display  EKG None  Radiology DG Elbow Complete Left  Result Date: 03/21/2020 CLINICAL DATA:  Left elbow pain following fall, initial encounter EXAM: LEFT ELBOW - COMPLETE 3+ VIEW COMPARISON:  None. FINDINGS: Joint effusion is noted. Mild irregularity is noted at the junction of the radial head and radial neck suspicious for undisplaced fracture. No other focal abnormality is noted. IMPRESSION: Irregularity at the junction of the radial head and radial neck suspicious for an undisplaced fracture with associated joint effusion. Electronically Signed   By: Alcide CleverMark  Lukens M.D.   On: 03/21/2020  11:33   DG Wrist Complete Left  Result Date: 03/21/2020 CLINICAL DATA:  Left wrist pain EXAM: LEFT WRIST - COMPLETE 3+ VIEW COMPARISON:  None. FINDINGS: There is no evidence of fracture or dislocation. There is no evidence of arthropathy or other focal bone abnormality. Soft tissues are unremarkable. IMPRESSION: No acute abnormality noted. Electronically Signed    By: Alcide Clever M.D.   On: 03/21/2020 11:32   DG Shoulder Left  Result Date: 03/21/2020 CLINICAL DATA:  Fall EXAM: LEFT SHOULDER - 2+ VIEW COMPARISON:  None. FINDINGS: There is no evidence of fracture or dislocation. There is no evidence of arthropathy or other focal bone abnormality. Soft tissues are unremarkable. IMPRESSION: No fracture or dislocation of the left shoulder. Electronically Signed   By: Lauralyn Primes M.D.   On: 03/21/2020 12:26   DG Knee Complete 4 Views Left  Result Date: 03/21/2020 CLINICAL DATA:  Fall yesterday with left knee pain. EXAM: LEFT KNEE - COMPLETE 4+ VIEW COMPARISON:  None. FINDINGS: Minimal degenerative changes over the patellofemoral joint. No fracture or dislocation. No significant joint effusion. IMPRESSION: No acute findings. Electronically Signed   By: Elberta Fortis M.D.   On: 03/21/2020 12:19   DG Hand Complete Left  Result Date: 03/21/2020 CLINICAL DATA:  Fall with bruising to center of left hand. EXAM: LEFT HAND - COMPLETE 3+ VIEW COMPARISON:  Wrist radiographs of earlier today. FINDINGS: Osteopenia. Overlap of fingers on the lateral view. No acute fracture or dislocation. No definite soft tissue swelling. IMPRESSION: No acute osseous abnormality. Electronically Signed   By: Jeronimo Greaves M.D.   On: 03/21/2020 12:22    Procedures Procedures (including critical care time)  Medications Ordered in ED Medications  ibuprofen (ADVIL) tablet 800 mg (800 mg Oral Given 03/21/20 1210)    ED Course  I have reviewed the triage vital signs and the nursing notes.  Pertinent labs & imaging results that were available during my care of the patient were reviewed by me and considered in my medical decision making (see chart for details).    MDM Rules/Calculators/A&P                          Pt does have a possible radial head fx on xray.  She is placed in a sling.  She sees [facility].  She is instructed to f/u with them.  Return if worse.  Final Clinical  Impression(s) / ED Diagnoses Final diagnoses:  Closed nondisplaced fracture of head of left radius, initial encounter  Fall, initial encounter    Rx / DC Orders ED Discharge Orders         Ordered    HYDROcodone-acetaminophen (NORCO/VICODIN) 5-325 MG tablet  Every 4 hours PRN        03/21/20 1242           Jacalyn Lefevre, MD 03/21/20 1243

## 2020-03-24 ENCOUNTER — Telehealth: Payer: Self-pay | Admitting: *Deleted

## 2020-03-24 NOTE — Telephone Encounter (Addendum)
PA approved from 03/24/20 to 03/23/21. Pharmacy notified.

## 2020-03-24 NOTE — Telephone Encounter (Signed)
Patient has an Financial controller through [employer] [phone]). This particular plan uses [employer]ef[employer] for prior authorizations. It has to be completed on the following website: [email or web address]://rxb.SecuritiesCard.pl.  PA submitted and chart notes faxed for review.  HD#[id number]. Decision pending.  RxBeneifts/PromptPA: Phone # for assistance: [phone] Phone # to status update: [phone] Fax: [phone] Alternate fax: [phone]  Standard turnaround time: 1-3 business days

## 2020-03-24 NOTE — Telephone Encounter (Signed)
Per patient, she is getting Ruth Gregory with a co-pay card.

## 2020-03-27 ENCOUNTER — Ambulatory Visit (INDEPENDENT_AMBULATORY_CARE_PROVIDER_SITE_OTHER): Payer: BC Managed Care – PPO | Admitting: Orthopaedic Surgery

## 2020-03-27 ENCOUNTER — Other Ambulatory Visit: Payer: Self-pay

## 2020-03-27 ENCOUNTER — Encounter: Payer: Self-pay | Admitting: Orthopaedic Surgery

## 2020-03-27 DIAGNOSIS — S52125A Nondisplaced fracture of head of left radius, initial encounter for closed fracture: Secondary | ICD-10-CM | POA: Diagnosis not present

## 2020-03-27 DIAGNOSIS — S63502A Unspecified sprain of left wrist, initial encounter: Secondary | ICD-10-CM

## 2020-03-27 NOTE — Progress Notes (Signed)
Office Visit Note   Patient: Markeisha Mancias           Date of Birth: 13-Jul-1960           MRN: [id number] Visit Date: 03/27/2020              Requested by: Lucky Cowboy, MD [address] [place],  [place] [address] PCP: Lucky Cowboy, MD   Assessment & Plan: Visit Diagnoses:  1. Sprain of left wrist, initial encounter   2. Closed nondisplaced fracture of head of left radius, initial encounter     Plan: Impression is probable nondisplaced left radial neck fracture and left wrist sprain.  In regards to the elbow, she does not need to continue wearing her sling.  She will remain nonweightbearing to the left upper extremity for another 3 weeks.  She will work on gentle range of motion of the elbow as tolerated.  In regards to the left wrist, I believe this is just a sprain.  We will provide her with a removable splint to wear for comfort.  She will follow up with [place] in 3 weeks time for repeat evaluation and 3 view x-rays of the left elbow and left wrist should she still have pain in the wrist.  She will call us with concerns or questions in the meantime.  Follow-Up Instructions: Return in about 3 weeks (around 04/17/2020).   Orders:  No orders of the defined types were placed in this encounter.  No orders of the defined types were placed in this encounter.     Procedures: No procedures performed   Clinical Data: No additional findings.   Subjective: Chief Complaint  Patient presents with  . Left Elbow - Pain    HPI patient is a pleasant 59 year old female who comes in today following an injury to her left elbow.  On 03/20/2020, she sustained a mechanical fall landing on her left side.  She was seen at [facility] with left shoulder, elbow, wrist and hand pain.  X-rays of the left elbow demonstrated a questionable fracture to the radial neck.  She is placed in a sling.  She comes in today for further evaluation and treatment recommendation.   The pain she has is primarily to the elbow and the dorsum of the left hand.  Worse with certain movements.  Of note, she is status post CVA back in 2009 affecting her left upper and lower extremities.  She does note that she has had decreased sensation to the left upper extremity since.  Review of Systems as detailed in HPI.  All others reviewed and are negative.   Objective: Vital Signs: There were no vitals taken for this visit.  Physical Exam well-developed well-nourished female no acute distress.  Alert oriented x3.  Ortho Exam left elbow exam shows 4 out of 10 tenderness to the radial head.  Mildly increased pain with supination and pronation.  No pain with flexion or extension of the elbow.  She has diffuse tenderness to the dorsum of the wrist.  Near full range of motion with minimal pain.  She is neurovascular intact distally.  Specialty Comments:  No specialty comments available.  Imaging: No new imaging   PMFS History: Patient Active Problem List   Diagnosis Date Noted  . Spastic hemiplegia of left nondominant side as late effect of cerebrovascular disease (HCC) 06/18/2019  . Abnormal glucose 07/27/2018  . Labile hypertension 07/27/2018  . Fatty liver 10/09/2017  . GERD (gastroesophageal reflux  disease) 10/07/2017  . Anxiety 10/07/2017  . Hypothyroidism 07/23/2015  . Vitamin D deficiency 10/03/2013  . Medication management 10/03/2013  . History of prediabetes 10/03/2013  . ADD (attention deficit disorder) without hyperactivity 10/03/2013  . Hyperlipidemia, mixed 04/04/2013  . Sinusitis, chronic 04/04/2013  . Chronic migraine 09/06/2012  . Spastic hemiplegia affecting left nondominant side (HCC) 09/06/2012  . History of CVA (cerebrovascular accident)    Past Medical History:  Diagnosis Date  . Anemia   . Anxiety   . Asthma   . Chronic heartburn   . Depression   . Elevated cholesterol    on Crestor  . Headache(784.0)   . Hypertension   . Hypothyroidism     S/P thyroidectomy  . Migraine   . Occlusion and stenosis of carotid artery with cerebral infarction   . Spasm of muscle   . Unspecified cerebral artery occlusion with cerebral infarction   . Vitamin D deficiency     Family History  Problem Relation Age of Onset  . Heart disease Mother   . Peptic Ulcer Disease Mother   . Heart disease Father   . Parkinson's disease Father     Past Surgical History:  Procedure Laterality Date  . AUGMENTATION MAMMAPLASTY Bilateral 2000  . COSMETIC SURGERY    . CYSTOCELE REPAIR N/A 01/08/2013   Procedure: ANTERIOR REPAIR (CYSTOCELE);  Surgeon: Meriel Pica, MD;  Location: [facility];  Service: Gynecology;  Laterality: N/A;  . NASAL SINUS SURGERY    . VAGINAL HYSTERECTOMY     Social History   Occupational History  . Occupation: DESIGNER    Employer: [employer]  Tobacco Use  . Smoking status: Never Smoker  . Smokeless tobacco: Never Used  Substance and Sexual Activity  . Alcohol use: Yes    Comment: occassionally drinks a glass of wine  . Drug use: No  . Sexual activity: Not on file

## 2020-03-31 ENCOUNTER — Telehealth: Payer: Self-pay | Admitting: Neurology

## 2020-03-31 ENCOUNTER — Ambulatory Visit: Payer: BC Managed Care – PPO | Admitting: Family Medicine

## 2020-03-31 NOTE — Telephone Encounter (Signed)
She had a fall on 03/21/20. Suffered a closed nondisplaced fracture of head of left radius and sprained left wrist. She has been seen by ortho and is wearing a brace. Still having some positional discomfort. Pending Botox appt on 04/09/20. She is going to call us back on 04/07/20 to give [place] an update on how she is feeling. We may need to clear her injections with Dr. Terrace Arabia.

## 2020-03-31 NOTE — Telephone Encounter (Signed)
Pt wants it known that on 12-23 she had a fall on her concrete.  Pt fractured her elbox and sprang the wrist(this being the side she gets Botox for).  Please call pt to discuss.

## 2020-04-05 ENCOUNTER — Other Ambulatory Visit: Payer: Self-pay | Admitting: Internal Medicine

## 2020-04-07 ENCOUNTER — Telehealth: Payer: Self-pay | Admitting: Neurology

## 2020-04-07 NOTE — Telephone Encounter (Signed)
Pt called arm and hand feeling better. Would you like for me to still keep my Botox appt on 1/12. Would like a call from the nurse.

## 2020-04-07 NOTE — Telephone Encounter (Signed)
I called the patient back. Pt stated she has a fractured elbow and a sprained wrist. There was nothing to do except wear the sling and she is feeling better now. See original call from 03/31/20 for reference. Pt stated as long as Dr Terrace Arabia is ok with her coming, she will plan to be here on Wednesday 04/09/20 for 3 pm appt Botox injection. I will call her only if Dr Terrace Arabia is not ok with her proceeding with the appt.

## 2020-04-08 ENCOUNTER — Other Ambulatory Visit: Payer: Self-pay | Admitting: Internal Medicine

## 2020-04-08 DIAGNOSIS — F419 Anxiety disorder, unspecified: Secondary | ICD-10-CM

## 2020-04-08 MED ORDER — ALPRAZOLAM 0.5 MG PO TABS: ORAL_TABLET | ORAL | 0 refills | Status: DC

## 2020-04-08 NOTE — Telephone Encounter (Signed)
Ok to keep previous appointment 

## 2020-04-08 NOTE — Telephone Encounter (Signed)
Noted  

## 2020-04-09 ENCOUNTER — Encounter: Payer: Self-pay | Admitting: *Deleted

## 2020-04-09 ENCOUNTER — Other Ambulatory Visit: Payer: Self-pay

## 2020-04-09 ENCOUNTER — Ambulatory Visit (INDEPENDENT_AMBULATORY_CARE_PROVIDER_SITE_OTHER): Payer: BC Managed Care – PPO | Admitting: Neurology

## 2020-04-09 ENCOUNTER — Encounter: Payer: Self-pay | Admitting: Neurology

## 2020-04-09 VITALS — BP 141/86 | HR 102 | Wt 166.4 lb

## 2020-04-09 DIAGNOSIS — I69954 Hemiplegia and hemiparesis following unspecified cerebrovascular disease affecting left non-dominant side: Secondary | ICD-10-CM | POA: Diagnosis not present

## 2020-04-09 DIAGNOSIS — G43709 Chronic migraine without aura, not intractable, without status migrainosus: Secondary | ICD-10-CM
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
## 2020-04-09 NOTE — Progress Notes (Signed)
Botox 100 units x 3 vials (each w/ 2cc NS) Lot: [id number] Exp: 04/2022 NDC: 2694-8546-27 Office supply

## 2020-04-10 ENCOUNTER — Telehealth: Payer: Self-pay | Admitting: Neurology

## 2020-04-10 NOTE — Telephone Encounter (Signed)
Late entry: Patient had a Botox appointment yesterday. 300U were used for spastic hemiplegia and migraine. Patient has PA on file with [employer] [facility]. PA #[id number] (04/09/20- 04/08/21).

## 2020-04-15 DIAGNOSIS — G43709 Chronic migraine without aura, not intractable, without status migrainosus: Secondary | ICD-10-CM

## 2020-04-15 DIAGNOSIS — I69954 Hemiplegia and hemiparesis following unspecified cerebrovascular disease affecting left non-dominant side: Secondary | ICD-10-CM

## 2020-04-15 MED ORDER — ONABOTULINUMTOXINA 100 UNITS IJ SOLR
300.0000 [IU] | Freq: Once | INTRAMUSCULAR | Status: AC
  Administered 2020-04-15: 300 [IU] via INTRAMUSCULAR

## 2020-04-17 ENCOUNTER — Other Ambulatory Visit: Payer: Self-pay | Admitting: Internal Medicine

## 2020-04-17 ENCOUNTER — Other Ambulatory Visit: Payer: Self-pay

## 2020-04-17 ENCOUNTER — Ambulatory Visit (INDEPENDENT_AMBULATORY_CARE_PROVIDER_SITE_OTHER): Payer: BC Managed Care – PPO | Admitting: Orthopaedic Surgery

## 2020-04-17 ENCOUNTER — Ambulatory Visit: Payer: Self-pay

## 2020-04-17 DIAGNOSIS — M25532 Pain in left wrist: Secondary | ICD-10-CM

## 2020-04-17 DIAGNOSIS — S52125A Nondisplaced fracture of head of left radius, initial encounter for closed fracture: Secondary | ICD-10-CM

## 2020-04-17 DIAGNOSIS — E782 Mixed hyperlipidemia: Secondary | ICD-10-CM

## 2020-04-17 NOTE — Progress Notes (Signed)
Office Visit Note   Patient: Ruth Gregory           Date of Birth: March 18, 1961           MRN: [id number] Visit Date: 04/17/2020              Requested by: Lucky Cowboy, MD [address] [place],  [place] [address] PCP: Lucky Cowboy, MD   Assessment & Plan: Visit Diagnoses:  1. Acute pain of left wrist   2. Closed nondisplaced fracture of head of left radius, initial encounter     Plan: Impression is resolved left wrist sprain and clinically and radiographically healed left radial neck fracture.  She will increase activity and use of the extremity as tolerated.  We will see her back as needed.  Follow-Up Instructions: Return if symptoms worsen or fail to improve.   Orders:  Orders Placed This Encounter  Procedures  . XR Elbow Complete Left (3+View)   No orders of the defined types were placed in this encounter.     Procedures: No procedures performed   Clinical Data: No additional findings.   Subjective: Chief Complaint  Patient presents with  . Left Elbow - Follow-up  . Left Wrist - Follow-up    Ms. Kesling returns today for follow-up of left wrist sprain and left radial neck fracture.  Her wrist feels much better and her elbow is improving as well.  She has been doing physical therapy at [facility].   Review of Systems   Objective: Vital Signs: There were no vitals taken for this visit.  Physical Exam  Ortho Exam Left wrist shows no swelling or tenderness palpation.  Good range of motion.  Good strength.  Left elbow shows normal flexion and supination pronation.  Lacks about 10 degrees of full extension.  No tenderness to palpation of the radial head.  Specialty Comments:  No specialty comments available.  Imaging: XR Elbow Complete Left (3+View)  Result Date: 04/17/2020 X-rays demonstrate healing radial head fracture.  Fracture remains nondisplaced.    PMFS History: Patient Active Problem List   Diagnosis Date  Noted  . Chronic migraine w/o aura w/o status migrainosus, not intractable 04/15/2020  . Spastic hemiplegia of left nondominant side as late effect of cerebrovascular disease (HCC) 06/18/2019  . Abnormal glucose 07/27/2018  . Labile hypertension 07/27/2018  . Fatty liver 10/09/2017  . GERD (gastroesophageal reflux disease) 10/07/2017  . Anxiety 10/07/2017  . Hypothyroidism 07/23/2015  . Vitamin D deficiency 10/03/2013  . Medication management 10/03/2013  . History of prediabetes 10/03/2013  . ADD (attention deficit disorder) without hyperactivity 10/03/2013  . Hyperlipidemia, mixed 04/04/2013  . Sinusitis, chronic 04/04/2013  . Chronic migraine 09/06/2012  . Spastic hemiplegia affecting left nondominant side (HCC) 09/06/2012  . History of CVA (cerebrovascular accident)    Past Medical History:  Diagnosis Date  . Anemia   . Anxiety   . Asthma   . Chronic heartburn   . Depression   . Elevated cholesterol    on Crestor  . Headache(784.0)   . Hypertension   . Hypothyroidism    S/P thyroidectomy  . Migraine   . Occlusion and stenosis of carotid artery with cerebral infarction   . Spasm of muscle   . Unspecified cerebral artery occlusion with cerebral infarction   . Vitamin D deficiency     Family History  Problem Relation Age of Onset  . Heart disease Mother   . Peptic Ulcer Disease Mother   .  Heart disease Father   . Parkinson's disease Father     Past Surgical History:  Procedure Laterality Date  . AUGMENTATION MAMMAPLASTY Bilateral 2000  . COSMETIC SURGERY    . CYSTOCELE REPAIR N/A 01/08/2013   Procedure: ANTERIOR REPAIR (CYSTOCELE);  Surgeon: Meriel Pica, MD;  Location: [facility];  Service: Gynecology;  Laterality: N/A;  . NASAL SINUS SURGERY    . VAGINAL HYSTERECTOMY     Social History   Occupational History  . Occupation: DESIGNER    Employer: [employer]  Tobacco Use  . Smoking status: Never Smoker  . Smokeless tobacco: Never Used  Substance and Sexual  Activity  . Alcohol use: Yes    Comment: occassionally drinks a glass of wine  . Drug use: No  . Sexual activity: Not on file

## 2020-05-07 DIAGNOSIS — S63502A Unspecified sprain of left wrist, initial encounter: Secondary | ICD-10-CM | POA: Diagnosis not present

## 2020-05-07 DIAGNOSIS — S52122A Displaced fracture of head of left radius, initial encounter for closed fracture: Secondary | ICD-10-CM | POA: Diagnosis not present

## 2020-05-07 DIAGNOSIS — M6281 Muscle weakness (generalized): Secondary | ICD-10-CM | POA: Diagnosis not present

## 2020-05-09 DIAGNOSIS — S52122A Displaced fracture of head of left radius, initial encounter for closed fracture: Secondary | ICD-10-CM | POA: Diagnosis not present

## 2020-05-09 DIAGNOSIS — M6281 Muscle weakness (generalized): Secondary | ICD-10-CM | POA: Diagnosis not present

## 2020-05-09 DIAGNOSIS — S63502A Unspecified sprain of left wrist, initial encounter: Secondary | ICD-10-CM | POA: Diagnosis not present

## 2020-05-12 ENCOUNTER — Other Ambulatory Visit: Payer: Self-pay | Admitting: Internal Medicine

## 2020-05-12 DIAGNOSIS — E039 Hypothyroidism, unspecified: Secondary | ICD-10-CM

## 2020-05-14 DIAGNOSIS — S52122A Displaced fracture of head of left radius, initial encounter for closed fracture: Secondary | ICD-10-CM | POA: Diagnosis not present

## 2020-05-14 DIAGNOSIS — M6281 Muscle weakness (generalized): Secondary | ICD-10-CM | POA: Diagnosis not present

## 2020-05-14 DIAGNOSIS — S63502A Unspecified sprain of left wrist, initial encounter: Secondary | ICD-10-CM | POA: Diagnosis not present

## 2020-05-27 DIAGNOSIS — H18513 Endothelial corneal dystrophy, bilateral: Secondary | ICD-10-CM | POA: Diagnosis not present

## 2020-06-03 ENCOUNTER — Other Ambulatory Visit: Payer: Self-pay | Admitting: Internal Medicine

## 2020-06-03 DIAGNOSIS — F988 Other specified behavioral and emotional disorders with onset usually occurring in childhood and adolescence: Secondary | ICD-10-CM

## 2020-06-03 MED ORDER — AMPHETAMINE-DEXTROAMPHETAMINE 10 MG PO TABS
ORAL_TABLET | ORAL | 0 refills | Status: DC
Start: 2020-06-03 — End: 2020-08-15

## 2020-06-05 ENCOUNTER — Ambulatory Visit: Payer: BC Managed Care – PPO | Admitting: Adult Health

## 2020-06-19 ENCOUNTER — Other Ambulatory Visit: Payer: Self-pay | Admitting: Internal Medicine

## 2020-06-19 DIAGNOSIS — F419 Anxiety disorder, unspecified: Secondary | ICD-10-CM

## 2020-06-26 ENCOUNTER — Ambulatory Visit: Payer: BC Managed Care – PPO | Admitting: Adult Health Nurse Practitioner

## 2020-06-30 ENCOUNTER — Encounter: Payer: Self-pay | Admitting: Adult Health Nurse Practitioner

## 2020-06-30 ENCOUNTER — Ambulatory Visit (INDEPENDENT_AMBULATORY_CARE_PROVIDER_SITE_OTHER): Payer: BC Managed Care – PPO | Admitting: Adult Health Nurse Practitioner

## 2020-06-30 ENCOUNTER — Other Ambulatory Visit: Payer: Self-pay

## 2020-06-30 VITALS — BP 98/60 | HR 78 | Temp 97.5°F | Ht 68.5 in | Wt 164.8 lb

## 2020-06-30 DIAGNOSIS — Z13220 Encounter for screening for lipoid disorders: Secondary | ICD-10-CM | POA: Diagnosis not present

## 2020-06-30 DIAGNOSIS — Z0000 Encounter for general adult medical examination without abnormal findings: Secondary | ICD-10-CM

## 2020-06-30 DIAGNOSIS — F988 Other specified behavioral and emotional disorders with onset usually occurring in childhood and adolescence: Secondary | ICD-10-CM

## 2020-06-30 DIAGNOSIS — J4520 Mild intermittent asthma, uncomplicated: Secondary | ICD-10-CM

## 2020-06-30 DIAGNOSIS — I10 Essential (primary) hypertension: Secondary | ICD-10-CM

## 2020-06-30 DIAGNOSIS — Z1329 Encounter for screening for other suspected endocrine disorder: Secondary | ICD-10-CM

## 2020-06-30 DIAGNOSIS — R0989 Other specified symptoms and signs involving the circulatory and respiratory systems: Secondary | ICD-10-CM

## 2020-06-30 DIAGNOSIS — G43709 Chronic migraine without aura, not intractable, without status migrainosus: Secondary | ICD-10-CM

## 2020-06-30 DIAGNOSIS — R7309 Other abnormal glucose: Secondary | ICD-10-CM

## 2020-06-30 DIAGNOSIS — Z79899 Other long term (current) drug therapy: Secondary | ICD-10-CM

## 2020-06-30 DIAGNOSIS — Z131 Encounter for screening for diabetes mellitus: Secondary | ICD-10-CM

## 2020-06-30 DIAGNOSIS — I69954 Hemiplegia and hemiparesis following unspecified cerebrovascular disease affecting left non-dominant side: Secondary | ICD-10-CM

## 2020-06-30 DIAGNOSIS — E782 Mixed hyperlipidemia: Secondary | ICD-10-CM

## 2020-06-30 DIAGNOSIS — E559 Vitamin D deficiency, unspecified: Secondary | ICD-10-CM

## 2020-06-30 DIAGNOSIS — I959 Hypotension, unspecified: Secondary | ICD-10-CM

## 2020-06-30 NOTE — Progress Notes (Signed)
FOLLOW UP 3 MONTH   Assessment and Plan:   Chronic migraine Managing well at this time, managed with Neurology and receiving Botox injections Q643months. Verapamil 240mg  daily   Spastic hemiplegia of left nondominant side as late effect of cerebrovascular disease, unspecified cerebrovascular disease type (HCC) Continue Botox injections, follow with Neurology, Dr Terrace ArabiaYan for this. -     COMPLETE METABOLIC PANEL WITH GFR  Anxiety Doing well at this time Continue Alprazolam with benefit, but will cut down to 0.5 mg for her to try 1/2 as needed Will also cut back on wellbutrin from 300mg  to 150mg  for possible serotonin syndrome.  Stress management techniques discussed, increase water, good sleep hygiene discussed, increase exercise, and increase veggies.   Mixed hyperlipidemia Continue medications: rosuvastatin, fenofibrate  Continue low cholesterol diet and exercise.  Check lipid panel.  -     Lipid panel  Hypothyroidism, unspecified type Taking levothyroxine 88 mcg daily Reminder to take on an empty stomach 30-660mins before first meal of the day. No antacid medications for 4 hours. -     TSH   ADD (attention deficit disorder) without hyperactivity Doing well on current regiment Continue with benefit -Adderall 10mg  1/2 to 1 tablet BID  Vitamin D deficiency monitor  Other abnormal glucose Recent A1Cs at goal Discussed diet/exercise, weight management  Defer A1C; check CMP  Labile hypertension Controlled today, will continue to monitor  Medication management -     Magnesium -     CBC with Differential/Platelet -     COMPLETE METABOLIC PANEL WITH GFR  BMI 25.0-25.9 Discussed dietary and exercise modifications Continue to recommend diet heavy in fruits and veggies and low in animal meats, cheeses, and dairy products, appropriate calorie intake Discuss exercise recommendations routinely Continue to monitor weight at each visit   Mild intermittent asthma Has in date  albuterol inhaler Doing well Using Breo daily Reminder to rinse mouth after use.   Continue diet and meds as discussed. Further disposition pending results of labs. Discussed med's effects and SE's.   Over 30 minutes of face to face interview, exam, counseling, chart review, and critical decision making was performed.   Future Appointments  Date Time Provider Department Center  07/09/2020  3:30 PM Levert FeinsteinYan, Yijun, MD [facility] None  11/03/2020 11:00 AM Lucky CowboyMcKeown, William, MD [facility] None    ----------------------------------------------------------------------------------------------------------------------  HPI 60 y.o. female  presents for 3 month follow up on elevated blood pressures w/o diagnosis of HTN, HLD, Migraine, Hypothyroidism,  Pre-Diabetes and Vitamin D Deficiency.   In 2009, she had a Right MCA CVA attributed to a Right Carotid artery dissection from fibromuscular dysplasia and has residual of a mild spastic Left HP and mild cognitive dysfunction difficulty focusing/concentrating. These latter sx's are improved with routine Adderall.  Patient also follow with Dr Terrace ArabiaYan, Neurologist, for Bo-Tox injections for her spasticity and migraines.  Weather and situational stress trigger for migraines.   she has a diagnosis of anxiety and is currently on wellbutrin 150 mg and PRN xanax. Her anxiety has been worse, she is on several other serotonin agents. she reports currently takes xanax rarely, takes 1/2 tab just a few days per month but feels that it is too strong so does not want to take.  Need most recent labs faxed Dr Allena KatzPatel, Kid[facility] Specialist  BMI is Body mass index is 24.69 kg/m., she has been working on diet and exercise. Wt Readings from Last 3 Encounters:  06/30/20 164 lb 12.8 oz (74.8 kg)  04/09/20 166 lb 6.4  oz (75.5 kg)  03/21/20 164 lb (74.4 kg)   Her blood pressure has been controlled at home, today their BP is BP: 98/60  She does not workout. She denies any cardiac symptoms,  chest pains, palpitations, shortness of breath, dizziness or lower extremity edema.     She is on cholesterol medication Rosuvastatin 40 mg every other day, fenofibrate 134 mg daily and denies myalgias. Her cholesterol is at goal. The cholesterol last visit was:   Lab Results  Component Value Date   CHOL 173 03/14/2020   HDL 52 03/14/2020   LDLCALC 83 03/14/2020   TRIG 312 (H) 03/14/2020   CHOLHDL 3.3 03/14/2020    She has not been working on diet and exercise for prediabetes (5.8%, 2008), and denies hyperglycemia, hypoglycemia , nausea, polydipsia and polyuria. Last A1C in the office was:  Lab Results  Component Value Date   HGBA1C 5.7 (H) 03/14/2020   Patient has been on Thyroid Supression since Thyroid surgeryfor benign nodule and goiterin 2007.  She is currently taking levothyroxine 88 mcg daily.   Her medication was not changed last visit.   Lab Results  Component Value Date   TSH 0.57 03/14/2020   Patient is on Vitamin D supplement, has cut back from 50,000 once a week. Lab Results  Component Value Date   VD25OH 82 03/14/2020        Current Medications:   Current Outpatient Medications (Endocrine & Metabolic):  .  levothyroxine (SYNTHROID) 88 MCG tablet, TAKE 1 TABLET DAILY ON AN EMPTY STOMACH WITH ONLY WATER FOR 30 MINUTES & NO ANTACID MEDS, CALCIUM OR MAGNESIUM FOR 4 HOURS & AVOID BIOTIN  Current Outpatient Medications (Cardiovascular):  .  fenofibrate micronized (LOFIBRA) 134 MG capsule, TAKE 1 CAPSULE DAILY FOR TRIGLYCERIDES (BLOOD FATS) .  rosuvastatin (CRESTOR) 40 MG tablet, Take 1 tablet (40 mg total) by mouth daily. Take 1 tablet Daily for Cholesterol .  verapamil (CALAN-SR) 240 MG CR tablet, TAKE 1 TABLET DAILY WITH FOOD FOR BP & MIGRAINE PREVENTION  Current Outpatient Medications (Respiratory):  .  albuterol (PROVENTIL HFA) 108 (90 Base) MCG/ACT inhaler, 1 to 2 inhalations 10-15 minutes apart every 4 hours if needed for asthma rescue .  BREO ELLIPTA 100-25  MCG/INH AEPB, USE 1 INHALATION DAILY TO PREVENT ASTHMA .  cetirizine (ZYRTEC) 10 MG tablet, Take 10 mg by mouth daily. .  montelukast (SINGULAIR) 10 MG tablet, TAKE 1 TABLET BY MOUTH AT BEDTIME FOR ALLERGIES  Current Outpatient Medications (Analgesics):  .  aspirin EC 81 MG tablet, Take 81 mg by mouth daily. .  butalbital-acetaminophen-caffeine (FIORICET) 50-325-40 MG tablet, Take 1 tablet by mouth daily as needed for headache. Do not refill in less than 30 days, .  Ubrogepant (UBRELVY) 100 MG TABS, Take 100 mg by mouth as needed.  Current Outpatient Medications (Hematological):  .  vitamin B-12 (CYANOCOBALAMIN) 1000 MCG tablet, Take 1,000 mcg by mouth daily.  Current Outpatient Medications (Other):  Marland Kitchen  ALPRAZolam (XANAX) 0.5 MG tablet, TAKE 1/2-1 TABLET AT BEDTIME ONLY IF NEEDED FOR SLEEP .  amphetamine-dextroamphetamine (ADDERALL) 10 MG tablet, Take      1/2 - 1 tablet       1 or 2 x /day       for ADD .  augmented betamethasone dipropionate (DIPROLENE-AF) 0.05 % cream, Apply topically 2 (two) times daily as needed. .  Botulinum Toxin Type A 200 units SOLR, Inject 400 Units as directed every 3 (three) months. Change to BOTOX A 100  units .  buPROPion (WELLBUTRIN XL) 150 MG 24 hr tablet, TAKE 1 TABLET EVERY MORNING FOR MOOD. FOCUS & CONCENTRATION .  Estradiol 10 MCG TABS vaginal tablet, Place 10 mcg vaginally 2 (two) times a week. .  magnesium oxide (MAG-OX) 400 MG tablet, Take 400 mg by mouth 2 (two) times daily.  .  Multiple Vitamin (MULTIVITAMIN WITH MINERALS) TABS tablet, Take 1 tablet by mouth daily. .  Multiple Vitamins-Minerals (ZINC PO), Take 1 tablet by mouth daily. .  nortriptyline (PAMELOR) 25 MG capsule, Take 1 capsule (25 mg total) by mouth at bedtime. Marland Kitchen  omeprazole (PRILOSEC) 40 MG capsule, TAKE 1 CAPSULE DAILY FOR ACID INDIGESTION & REFLUX .  ondansetron (ZOFRAN ODT) 8 MG disintegrating tablet, Dissolve 1 tablet under tongue every 6 to 8 hours for nausea  or vomitting .   Riboflavin 100 MG TABS, Take 100 mg by mouth. bid .  Vitamin D, Ergocalciferol, (DRISDOL) 1.25 MG (50000 UT) CAPS capsule, Take 1 capsule daily for Vitamin D deficiency. (Patient taking differently: Take 50,000 Units by mouth every 7 (seven) days.)  Allergies:  Allergies  Allergen Reactions  . Penicillins Anaphylaxis and Hives  . Imitrex [Sumatriptan]   . Other Swelling    kiwi  . Sulfa Antibiotics Hives  . Zomig [Zolmitriptan]      Medical History:  Past Medical History:  Diagnosis Date  . Anemia   . Anxiety   . Asthma   . Chronic heartburn   . Depression   . Elevated cholesterol    on Crestor  . Headache(784.0)   . Hypertension   . Hypothyroidism    S/P thyroidectomy  . Migraine   . Occlusion and stenosis of carotid artery with cerebral infarction   . Spasm of muscle   . Unspecified cerebral artery occlusion with cerebral infarction   . Vitamin D deficiency    Allergies Allergies  Allergen Reactions  . Penicillins Anaphylaxis and Hives  . Imitrex [Sumatriptan]   . Other Swelling    kiwi  . Sulfa Antibiotics Hives  . Zomig [Zolmitriptan]     SURGICAL HISTORY She  has a past surgical history that includes Vaginal hysterectomy; Nasal sinus surgery; Cystocele repair (N/A, 01/08/2013); Cosmetic surgery; and Augmentation mammaplasty (Bilateral, 2000).   FAMILY HISTORY Her family history includes Heart disease in her father and mother; Parkinson's disease in her father; Peptic Ulcer Disease in her mother.   SOCIAL HISTORY She  reports that she has never smoked. She has never used smokeless tobacco. She reports current alcohol use. She reports that she does not use drugs.   Screening Tests: Immunization History  Administered Date(s) Administered  . Influenza Inj Mdck Quad With Preservative 01/20/2017, 01/09/2018, 01/10/2019, 01/25/2020  . Influenza Split 01/24/2014  . Influenza, Seasonal, Injecte, Preservative Fre 02/09/2016  . Influenza-Unspecified 11/28/2011   . PFIZER(Purple Top)SARS-COV-2 Vaccination 06/11/2019, 07/04/2019  . PPD Test 04/04/2013, 04/22/2015, 05/18/2016, 06/20/2017, 07/27/2018  . Pneumococcal Polysaccharide-23 03/29/1998  . Td 03/29/2002  . Tdap 05/18/2016     Review of Systems:  Review of Systems  Constitutional: Negative for chills, diaphoresis, fever, malaise/fatigue and weight loss.  HENT: Negative for congestion, ear discharge, ear pain, hearing loss, nosebleeds, sinus pain, sore throat and tinnitus.   Eyes: Negative for blurred vision, double vision, photophobia, pain, discharge and redness.  Respiratory: Negative for cough, hemoptysis, sputum production, shortness of breath, wheezing and stridor.   Cardiovascular: Negative for chest pain, palpitations, orthopnea, claudication, leg swelling and PND.  Gastrointestinal: Negative for abdominal pain, blood in stool, constipation, diarrhea,  heartburn, melena, nausea and vomiting.  Genitourinary: Negative for dysuria, flank pain, frequency, hematuria and urgency.  Musculoskeletal: Negative for back pain, falls, joint pain, myalgias and neck pain.  Skin: Negative for itching and rash.  Neurological: Negative for dizziness, tingling, tremors, sensory change, speech change, focal weakness, seizures, loss of consciousness, weakness and headaches.  Endo/Heme/Allergies: Negative for environmental allergies and polydipsia. Does not bruise/bleed easily.  Psychiatric/Behavioral: Negative for depression, hallucinations, memory loss, substance abuse and suicidal ideas. The patient is not nervous/anxious and does not have insomnia.      Physical Exam: BP 98/60   Pulse 78   Temp (!) 97.5 F (36.4 C)   Ht 5' 8.5" (1.74 m)   Wt 164 lb 12.8 oz (74.8 kg)   SpO2 98%   BMI 24.69 kg/m  Wt Readings from Last 3 Encounters:  06/30/20 164 lb 12.8 oz (74.8 kg)  04/09/20 166 lb 6.4 oz (75.5 kg)  03/21/20 164 lb (74.4 kg)    General Appearance: Well nourished, in no apparent  distress. Eyes: PERRLA, EOMs, conjunctiva no swelling or erythema Sinuses: No Frontal/maxillary tenderness ENT/Mouth: Ext aud canals clear, TMs without erythema, bulging. No erythema, swelling, or exudate on post pharynx.  Tonsils not swollen or erythematous. Hearing normal.  Neck: Supple, thyroid normal.  Respiratory: Respiratory effort normal, BS equal bilaterally without rales, rhonchi, wheezing or stridor.  Cardio: RRR with no MRGs. Brisk peripheral pulses without edema.  Abdomen: Soft, + BS.  Non tender, no guarding, rebound, hernias, masses. Lymphatics: Non tender without lymphadenopathy.  Musculoskeletal: Full ROM, 5/5 strength right, 4/5 strength on left. normal gait, slow. Skin: Warm, dry without rashes, lesions, ecchymosis.  Neuro: Cranial nerves intact. No cerebellar symptoms.  Psych: Awake and oriented X 3, normal affect, Insight and Judgment appropriate.    Elder Negus, NP 12:37 PM [facility]

## 2020-07-01 LAB — CBC WITH DIFFERENTIAL/PLATELET
Absolute Monocytes: 380 cells/uL (ref 200–950)
Basophils Absolute: 31 cells/uL (ref 0–200)
Basophils Relative: 0.6 %
Eosinophils Absolute: 151 cells/uL (ref 15–500)
Eosinophils Relative: 2.9 %
HCT: 43.1 % (ref 35.0–45.0)
Hemoglobin: 15 g/dL (ref 11.7–15.5)
Lymphs Abs: 1534 cells/uL (ref 850–3900)
MCH: 32.8 pg (ref 27.0–33.0)
MCHC: 34.8 g/dL (ref 32.0–36.0)
MCV: 94.1 fL (ref 80.0–100.0)
MPV: 11.4 fL (ref 7.5–12.5)
Monocytes Relative: 7.3 %
Neutro Abs: 3104 cells/uL (ref 1500–7800)
Neutrophils Relative %: 59.7 %
Platelets: 187 10*3/uL (ref 140–400)
RBC: 4.58 10*6/uL (ref 3.80–5.10)
RDW: 12.7 % (ref 11.0–15.0)
Total Lymphocyte: 29.5 %
WBC: 5.2 10*3/uL (ref 3.8–10.8)

## 2020-07-01 LAB — HEMOGLOBIN A1C
Hgb A1c MFr Bld: 5.7 % of total Hgb — ABNORMAL HIGH (ref <5.7)
Mean Plasma Glucose: 117 mg/dL
eAG (mmol/L): 6.5 mmol/L

## 2020-07-01 LAB — COMPLETE METABOLIC PANEL WITH GFR
AG Ratio: 2 (calc) (ref 1.0–2.5)
ALT: 16 U/L (ref 6–29)
AST: 15 U/L (ref 10–35)
Albumin: 4.6 g/dL (ref 3.6–5.1)
Alkaline phosphatase (APISO): 75 U/L (ref 37–153)
BUN/Creatinine Ratio: 22 (calc) (ref 6–22)
BUN: 28 mg/dL — ABNORMAL HIGH (ref 7–25)
CO2: 24 mmol/L (ref 20–32)
Calcium: 9.7 mg/dL (ref 8.6–10.4)
Chloride: 108 mmol/L (ref 98–110)
Creat: 1.26 mg/dL — ABNORMAL HIGH (ref 0.50–0.99)
GFR, Est African American: 53 mL/min/{1.73_m2} — ABNORMAL LOW (ref >=60)
GFR, Est Non African American: 46 mL/min/{1.73_m2} — ABNORMAL LOW (ref >=60)
Globulin: 2.3 g/dL (calc) (ref 1.9–3.7)
Glucose, Bld: 99 mg/dL (ref 65–99)
Potassium: 4.2 mmol/L (ref 3.5–5.3)
Sodium: 141 mmol/L (ref 135–146)
Total Bilirubin: 0.6 mg/dL (ref 0.2–1.2)
Total Protein: 6.9 g/dL (ref 6.1–8.1)

## 2020-07-01 LAB — TSH: TSH: 1.37 mIU/L (ref 0.40–4.50)

## 2020-07-01 LAB — LIPID PANEL
Cholesterol: 167 mg/dL (ref <200)
HDL: 55 mg/dL (ref >=50)
LDL Cholesterol (Calc): 84 mg/dL (calc)
Non-HDL Cholesterol (Calc): 112 mg/dL (calc) (ref <130)
Total CHOL/HDL Ratio: 3 (calc) (ref <5.0)
Triglycerides: 187 mg/dL — ABNORMAL HIGH (ref <150)

## 2020-07-02 DIAGNOSIS — D631 Anemia in chronic kidney disease: Secondary | ICD-10-CM | POA: Diagnosis not present

## 2020-07-02 DIAGNOSIS — E559 Vitamin D deficiency, unspecified: Secondary | ICD-10-CM | POA: Diagnosis not present

## 2020-07-02 DIAGNOSIS — N182 Chronic kidney disease, stage 2 (mild): Secondary | ICD-10-CM | POA: Diagnosis not present

## 2020-07-02 DIAGNOSIS — I129 Hypertensive chronic kidney disease with stage 1 through stage 4 chronic kidney disease, or unspecified chronic kidney disease: Secondary | ICD-10-CM | POA: Diagnosis not present

## 2020-07-09 ENCOUNTER — Encounter: Payer: Self-pay | Admitting: Neurology

## 2020-07-09 ENCOUNTER — Ambulatory Visit (INDEPENDENT_AMBULATORY_CARE_PROVIDER_SITE_OTHER): Payer: BC Managed Care – PPO | Admitting: Neurology

## 2020-07-09 VITALS — BP 116/79 | HR 89 | Ht 68.5 in | Wt 164.5 lb

## 2020-07-09 DIAGNOSIS — G43709 Chronic migraine without aura, not intractable, without status migrainosus: Secondary | ICD-10-CM | POA: Diagnosis not present

## 2020-07-09 DIAGNOSIS — I69954 Hemiplegia and hemiparesis following unspecified cerebrovascular disease affecting left non-dominant side: Secondary | ICD-10-CM

## 2020-07-09 MED ORDER — ONABOTULINUMTOXINA 100 UNITS IJ SOLR
300.0000 [IU] | Freq: Once | INTRAMUSCULAR | Status: AC
Start: 2020-07-09 — End: 2020-07-09
  Administered 2020-07-09: 300 [IU] via INTRAMUSCULAR

## 2020-07-09 NOTE — Progress Notes (Signed)
HISTORICAL  Ruth Gregory is a 60 year old right-handed female  History of right internal carotid section with spastic left hemiparesis, Botox injection every 3 months  She has history of right internal carotid artery dissection following an episode of vomiting from migraine headache in mid June 2009. She was given IV TPA and found to have right MCA occlusion. She underwent emergent right carotid artery stent with distal endovascular recanalization of the middle cerebral artery.  She had small hemorrhagic transformation of the right basal ganglia with mild left hemiparesis, but has done well since then and has been independent.  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  She has been receiving BOTOX injection since 01/2009 every 3 months, to her left upper extremity and also left lower extremity, responded well.  Repeat US carotid were normal in October 2013  Chronic migraine headaches: She had long-standing history of chronic migraine, average 2-4 headaches each months, she has increased frequency headaches around fall, and spring, she is now taking Topamax ER 150 mg every night as preventive medication, magnesium oxide 400 mg, riboflavin 100 mg twice a day,  She is not candidate for triptan because previous history of stroke, Cambia works well for her, she her insurance only allow her to have 9 tablets each months. Occasionally she take Fioricet, hydrocodone as needed, even prednisone  20 mg as needed as rescue therapy  Over the past few weeks, she been having to 3 headaches each week, used out her cambia, she also complains of neck muscle tightness, receiving chiropractor sometimes   Last Botox injection to spastic left upper extremity works well, but she noticed left hand muscle weakness.  UPDATE Mar 26 2015: She responded very well to previous Botox injection in September 2016, but noticed increased left hand weakness, gradually recovered over the past few weeks  Update July 15 2015: She responded very well to previous Botox injection in December 20 eighth 2016, noticed returning of left finger flexion spasticity, left ankle plantarflexion, want today's injection emphasize on above abnormal posturing.  She still has frequent migraine headaches, 1-2 headache each week, some headache was protracted, lasting 1-2 days, require multiple dose of cambia and Fioricet  Update November 20 2015: Patient called in September 09 2015 "She also said the left hand is very weak- 12,3 digits > 4,5. She said last OV she rec'd botox in wrist. She sts she may not be able to get botox next month if she is not feeling better"  She has a lot of pain on her left leg, mainly her left ankle from knee down, throbbing, tooth ache, she noticed more tendency of left ankle plantar flexion.  She continue have intermittent migraine headaches, more at the left retro-orbital  Area  UPDATE Dec 6th 2017: She responded very well to previous Botox injection, complains frequent migraine headaches, once a week, lasting 1-2 days, partial response to combination of cambia, Fioricet, Compazine, she is taking Topamax ER 150 mg as preventive medications  UPDATE June 30 2016: She had  her first Botox injection for migraine prevention in December 2017, with only used 75 units she reported dramatic improvement, she only has migraines about once a week, which is milder, better controlled by the medication, instead of 2-3 times  more severe headaches, at the end of the benefit, she noticed recurrent of her headaches, she also noticed mild left shoulder stiffness, left ankle turned in,  She noticed worsening right knee pain, recently had right knee injection.  Update November 04 2016: She responded well to previous injection, Botox injection to bilateral frontal temporal region has helped her headache,  UPDATE Mar 11 2017: She responded well to previous injection in August 2018.  Reported increased migraine headache over the past few weeks,  UPDATE Aug 08 2017: She is having migraine almost every week, lasting for 2 days, she is hesitate   UPDATE Sept 30 2019: She missed her appointment in August, she was given toradol and pheneran by pcp recently for protracted migraine headache, which did help her headache, but less effective than Depacon  She continue have 1 or 2 migraine headaches 1 to 2 weeks, also noticed increased left ankle spasticity, unsteady gait  UPDATE Mar 23 2018: She still has frequent headaches, want to explore the possibility of CGRP, responded very well to previous injection,  UPDATE Aug 23 2018: She responded very well to previous injection, only has occasional migraine headaches  UPDATE Sept 2 2020: She responded well to previous injection, had occasionally migraine headaches, Cambia works well for her, left hand finger spasm has much improved, she continue has mild left ankle plantarflexion,  Update March 15, 2019: She responded well to previous injection, increased migraine due to her Covid infection recently  UPDATE June 18 2019: She developed some left finger weakness following previous injection in December, recovered, doing well,  She also reported 1 episode of low while driving, made the wrong turn, she denied loss of consciousness, still has frequent migraine headaches  UPDATE September 26 2019: She did well with previous injection, especially the left hand, we used Botox 400 units  today  UPDATE December 26, 2019: She elected to receive last injection for left forearm at previous injection in June 2021, over the past few weeks, noticed worsening left hand  cupping, spasm,  She also have frequent migraine headaches,  Update April 09, 2020 She return for EMG guided botulism toxin injection for spastic left upper and lower extremity, migraine headaches, she fell, suffered fracture at the left elbow, recovering well  Update July 09, 2020: She has been doing very well, [name] works very well for her migraine headaches, use around once or twice each week, responding well    Review of system: Pertinent as above   ALLERGIES: Allergies  Allergen Reactions  . Penicillins Anaphylaxis and Hives  . Imitrex [Sumatriptan]   . Other Swelling    kiwi  . Sulfa Antibiotics Hives  . Zomig [Zolmitriptan]     HOME MEDICATIONS: Current Outpatient Medications  Medication Sig Dispense Refill  . albuterol (PROVENTIL HFA) 108 (90 Base) MCG/ACT inhaler 1 to 2 inhalations 10-15 minutes apart every 4 hours if needed for asthma rescue 48 g 3  . ALPRAZolam (XANAX) 0.5 MG tablet Take 1/2-1 tablet at Bedtime ONLY if needed for Sleep 30 tablet 0  . amphetamine-dextroamphetamine (ADDERALL) 10 MG tablet Take 1/2 to 1 tablet 1 or 2 x daily for ADD 60 tablet 0  . aspirin EC 81 MG tablet Take 81 mg by mouth daily.    Marland Kitchen  augmented betamethasone dipropionate (DIPROLENE-AF) 0.05 % cream Apply topically 2 (two) times daily as needed.    . Botulinum Toxin Type A (BOTOX) 200 UNITS SOLR Inject 400 Units as directed every 3 (three) months. Change to BOTOX A 100  units    . BREO ELLIPTA 100-25 MCG/INH AEPB 1 INHALATION DAILY TO PREVENT ASTHMA 180 each 3  . buPROPion (WELLBUTRIN XL) 150 MG 24 hr tablet TAKE 1 TABLET EVERY MORNING FOR MOOD. FOCUS & CONCENTRATION 30 tablet 2  . butalbital-acetaminophen-caffeine (FIORICET) 50-325-40 MG tablet Take 1 tablet by mouth daily as needed for headache. Do not  refill in less than 30 days, 12 tablet 5  . cetirizine (ZYRTEC) 10 MG tablet Take 10 mg by mouth daily.    Marland Kitchen dexamethasone (DECADRON) 4 MG tablet Take 1 tab 3 x day - 3 days, then 2 x day - 3 days, then 1 tab daily 20 tablet 0  . Diclofenac Potassium,Migraine, (CAMBIA) 50 MG PACK MIX 1 PACK WITH 1-2 OUNCES OF WATER AND DRINK AT ONSET OF MIGRAINE CAN REPEAT IN 2 HOURS IF NEEDED MAX 2/24 HOURS 8 each 11  . diclofenac Sodium (VOLTAREN) 1 % GEL Apply 4 g topically 4 (four) times daily as needed. 500 g 6  . Estradiol (YUVAFEM) 10 MCG TABS vaginal tablet Place 10 mcg vaginally 2 (two) times a week.    . fenofibrate micronized (LOFIBRA) 134 MG capsule TAKE 1 CAPSULE EVERY DAY 90 capsule 3  . levothyroxine (SYNTHROID) 88 MCG tablet Take 1 tablet daily on an empty stomach with only water for 30 minutes & no Antacid meds, Calcium or Magnesium for 4 hours & avoid Biotin 90 tablet 0  . magnesium oxide (MAG-OX) 400 MG tablet Take 400 mg by mouth 2 (two) times daily.     . montelukast (SINGULAIR) 10 MG tablet Take 1 tablet Daily  for Allergies. 90 tablet 3  . Multiple Vitamin (MULTIVITAMIN WITH MINERALS) TABS tablet Take 1 tablet by mouth daily.    . Multiple Vitamins-Minerals (ZINC PO) Take 1 tablet by mouth daily.    . nortriptyline (PAMELOR) 25 MG capsule Take 25 mg by mouth at bedtime.    Marland Kitchen omeprazole (PRILOSEC) 40 MG capsule TAKE 1 CAPSULE DAILY FOR ACID INDIGESTION & REFLUX 90 capsule 3  . ondansetron (ZOFRAN ODT) 8 MG disintegrating tablet Dissolve 1 tablet under tongue every 6 to 8 hours for nausea  or vomitting 30 tablet 0  . Riboflavin 100 MG TABS Take 100 mg by mouth. bid    . rosuvastatin (CRESTOR) 40 MG tablet Take 1 tablet (40 mg total) by mouth daily. Take 1 tablet Daily for Cholesterol 90 tablet 3  . topiramate (TOPAMAX) 100 MG tablet Take 2 tablets (200 mg total) by mouth daily. 180 tablet 3  . verapamil (CALAN-SR) 240 MG CR tablet TAKE 1 TABLET DAILY WITH FOOD FOR BP & MIGRAINE PREVENTION 90  tablet 3  . vitamin B-12 (CYANOCOBALAMIN) 1000 MCG tablet Take 1,000 mcg by mouth daily.    . Vitamin D, Ergocalciferol, (DRISDOL) 1.25 MG (50000 UT) CAPS capsule Take 1 capsule daily for Vitamin D deficiency. 90 capsule 1  . Erenumab-aooe (AIMOVIG) 70 MG/ML SOAJ Inject 70 mg into the skin every 30 (thirty) days. 1 mL 11  . Ubrogepant (UBRELVY) 50 MG TABS Take 50 mg by mouth as needed. May repeat once in 2 hours 12 tablet 11   No current facility-administered medications for this visit.    PAST MEDICAL HISTORY: Past Medical History:  Diagnosis  Date  . Anemia   . Anxiety   . Asthma   . Chronic heartburn   . Depression   . Elevated cholesterol    on Crestor  . Headache(784.0)   . Hypertension   . Hypothyroidism    S/P thyroidectomy  . Migraine   . Occlusion and stenosis of carotid artery with cerebral infarction   . Spasm of muscle   . Unspecified cerebral artery occlusion with cerebral infarction   . Vitamin D deficiency     PAST SURGICAL HISTORY: Past Surgical History:  Procedure Laterality Date  . AUGMENTATION MAMMAPLASTY Bilateral 2000  . COSMETIC SURGERY    . CYSTOCELE REPAIR N/A 01/08/2013   Procedure: ANTERIOR REPAIR (CYSTOCELE);  Surgeon: Meriel Pica, MD;  Location: [facility];  Service: Gynecology;  Laterality: N/A;  . NASAL SINUS SURGERY    . VAGINAL HYSTERECTOMY      FAMILY HISTORY: Family History  Problem Relation Age of Onset  . Heart disease Mother   . Peptic Ulcer Disease Mother   . Heart disease Father   . Parkinson's disease Father     SOCIAL HISTORY:  Social History   Socioeconomic History  . Marital status: Married    Spouse name: Brett Canales  . Number of children: 2  . Years of education: college  . Highest education level: Not on file  Occupational History  . Occupation: DESIGNER    Employer: S[employer]  Tobacco Use  . Smoking status: Never Smoker  . Smokeless tobacco: Never Used  Substance and Sexual Activity  . Alcohol use: Yes     Comment: occassionally drinks a glass of wine  . Drug use: No  . Sexual activity: Not on file  Other Topics Concern  . 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 Patient is married and lives with her husband. Patient has college education.   Right handed.   Caffeine- two cups daily.   Social Determinants of Health   Financial Resource Strain:   . Difficulty of Paying Living Expenses: Not on file  Food Insecurity:   . Worried About Programme researcher, broadcasting/film/video in the Last Year: Not on file  . Ran Out of Food in the Last Year: Not on file  Transportation Needs:   . Lack of Transportation (Medical): Not on file  . Lack of Transportation (Non-Medical): Not on file  Physical Activity:   . Days of Exercise per Week: Not on file  . Minutes of Exercise per Session: Not on file  Stress:   . Feeling of Stress : Not on file  Social Connections:   . Frequency of Communication with Friends and Family: Not on file  . Frequency of Social Gatherings with Friends and Family: Not on file  . Attends Religious Services: Not on file  . Active Member of Clubs or Organizations: Not on file  . Attends Banker Meetings: Not on file  . Marital Status: Not on file  Intimate Partner Violence:   . Fear of Current or Ex-Partner: Not on file  . Emotionally Abused: Not on file  . Physically Abused: Not on file  . Sexually Abused: Not on file     PHYSICAL EXAM   Vitals:   12/26/19 1427  BP: 140/84  Pulse: 92  Weight: 162 lb (73.5 kg)  Height: 5' 8.5" (1.74 m)   Not recorded     Body mass index is 24.27 kg/m.  NEUROLOGICAL EXAM:   MOTOR: Mild spastic left hemiparesis, left upper extremity proximal and distal 4/5, tendency for left thumb flexion, mild finger flexions She has mild left hip flexion and left ankle dorsiflexion weakness   GAIT/STANCE: Mildly unsteady gait, tendency for left ankle plantarflexion, bearing weight on  her left lateral foot Romberg is absent.   DIAGNOSTIC DATA (LABS, IMAGING, TESTING) - I reviewed patient records, labs, notes, testing and imaging myself where available.   ASSESSMENT AND PLAN  Ruth Gregory is a 60 y.o. female   Chronic migraine headaches  Continue preventive medications, nortriptyline 25 mg every night,   Ubrevyl as needed.   Also receiving Botox a 100 units as migraine prevention  Spastic left hemiparesis following right internal carotid artery dissection, right MCA stroke  EMG guided Botox injection, used 300 units of Botox A total   Left pronator teres 25 units Left flexor digitorum superficialis 25 units Left pectoralis major 25 units Left latissimus dorsi 25 units  Left flexor digitorum longus 50 units Left tibialis posterior 50 units    I also injected  100 units for chronic migraine headache  Bilateral temporalis 60 units Bilateral frontalis 20 units Bilateral parietal region 20 units  We will change to Botox 300 units, use aimovig as migraine prevention, also Ubrelvy as needed for abortive treatment  Levert FeinsteinYijun Garl Speigner, M.D. Ph.D.  [facility] [address], [address] [place], [place] [address] Ph: [phone] Fax: [phone]

## 2020-07-09 NOTE — Progress Notes (Signed)
**  Botox 100 units x 3 vials, NDC 2440-1027-25, Lot [id number], Exp 05/2022, office supply.//mck,rn**

## 2020-07-12 MED ORDER — ONABOTULINUMTOXINA 100 UNITS IJ SOLR
300.0000 [IU] | Freq: Once | INTRAMUSCULAR | Status: AC
  Administered 2020-07-12: 300 [IU] via INTRAMUSCULAR

## 2020-08-12 ENCOUNTER — Other Ambulatory Visit: Payer: Self-pay | Admitting: Neurology

## 2020-08-15 ENCOUNTER — Other Ambulatory Visit: Payer: Self-pay | Admitting: Internal Medicine

## 2020-08-15 DIAGNOSIS — F988 Other specified behavioral and emotional disorders with onset usually occurring in childhood and adolescence: Secondary | ICD-10-CM

## 2020-08-15 MED ORDER — AMPHETAMINE-DEXTROAMPHETAMINE 10 MG PO TABS: ORAL_TABLET | ORAL | 0 refills | Status: DC

## 2020-08-20 DIAGNOSIS — L821 Other seborrheic keratosis: Secondary | ICD-10-CM | POA: Diagnosis not present

## 2020-08-20 DIAGNOSIS — L570 Actinic keratosis: Secondary | ICD-10-CM | POA: Diagnosis not present

## 2020-08-20 DIAGNOSIS — L918 Other hypertrophic disorders of the skin: Secondary | ICD-10-CM | POA: Diagnosis not present

## 2020-08-20 DIAGNOSIS — L738 Other specified follicular disorders: Secondary | ICD-10-CM | POA: Diagnosis not present

## 2020-08-20 DIAGNOSIS — L578 Other skin changes due to chronic exposure to nonionizing radiation: Secondary | ICD-10-CM | POA: Diagnosis not present

## 2020-08-31 ENCOUNTER — Other Ambulatory Visit: Payer: Self-pay

## 2020-08-31 ENCOUNTER — Other Ambulatory Visit: Payer: Self-pay | Admitting: Neurology

## 2020-08-31 ENCOUNTER — Other Ambulatory Visit: Payer: Self-pay | Admitting: Internal Medicine

## 2020-08-31 DIAGNOSIS — F419 Anxiety disorder, unspecified: Secondary | ICD-10-CM

## 2020-08-31 MED ORDER — ALPRAZOLAM 0.5 MG PO TABS: ORAL_TABLET | ORAL | 0 refills | Status: DC

## 2020-09-01 DIAGNOSIS — Z0289 Encounter for other administrative examinations: Secondary | ICD-10-CM

## 2020-09-08 ENCOUNTER — Encounter: Payer: BC Managed Care – PPO | Admitting: Internal Medicine

## 2020-09-08 ENCOUNTER — Other Ambulatory Visit: Payer: Self-pay | Admitting: Neurology

## 2020-09-09 ENCOUNTER — Other Ambulatory Visit: Payer: Self-pay | Admitting: *Deleted

## 2020-09-09 ENCOUNTER — Telehealth: Payer: Self-pay | Admitting: *Deleted

## 2020-09-09 NOTE — Progress Notes (Deleted)
Assessment and Plan:  1. Fatigue, unspecified type      Further disposition pending results of labs. Discussed med's effects and SE's.   Over 30 minutes of exam, counseling, chart review, and critical decision making was performed.   Future Appointments  Date Time Provider Department Center  09/10/2020 10:00 AM Judd Gaudier, NP [facility] None  10/15/2020  2:30 PM Levert Feinstein, MD [facility] None  11/03/2020 11:00 AM Lucky Cowboy, MD [facility] None    ------------------------------------------------------------------------------------------------------------------   HPI There were no vitals taken for this visit.  60 y.o.female presents for   She is on thyroid medication. Her medication {WAS/WAS NOT:[phone]} changed last visit.   Lab Results  Component Value Date   TSH 1.37 06/30/2020  .  CBC Latest Ref Rng & Units 06/30/2020 03/14/2020 12/13/2019  WBC 3.8 - 10.8 Thousand/uL 5.2 4.7 5.6  Hemoglobin 11.7 - 15.5 g/dL 83.6 62.9 47.6  Hematocrit 35.0 - 45.0 % 43.1 44.9 44.2  Platelets 140 - 400 Thousand/uL 187 172 176      has History of CVA (cerebrovascular accident); Chronic migraine; Spastic hemiplegia affecting left nondominant side (HCC); Hyperlipidemia, mixed; Sinusitis, chronic; Vitamin D deficiency; Medication management; History of prediabetes; ADD (attention deficit disorder) without hyperactivity; Hypothyroidism; GERD (gastroesophageal reflux disease); Anxiety; Fatty liver; Abnormal glucose; Labile hypertension; Spastic hemiplegia of left nondominant side as late effect of cerebrovascular disease (HCC); and Chronic migraine w/o aura w/o status migrainosus, not intractable on their problem list.   Past Medical History:  Diagnosis Date   Anemia    Anxiety    Asthma    Chronic heartburn    Depression    Elevated cholesterol    on Crestor   Headache(784.0)    Hypertension    Hypothyroidism    S/P thyroidectomy   Migraine    Occlusion and stenosis of carotid  artery with cerebral infarction    Spasm of muscle    Unspecified cerebral artery occlusion with cerebral infarction    Vitamin D deficiency      Allergies  Allergen Reactions   Penicillins Anaphylaxis and Hives   Imitrex [Sumatriptan]    Other Swelling    kiwi   Sulfa Antibiotics Hives   Zomig [Zolmitriptan]     Current Outpatient Medications on File Prior to Visit  Medication Sig   albuterol (PROVENTIL HFA) 108 (90 Base) MCG/ACT inhaler 1 to 2 inhalations 10-15 minutes apart every 4 hours if needed for asthma rescue   ALPRAZolam (XANAX) 0.5 MG tablet Take 1/2 - 1 tablet at Bedtime  ONLY if needed forSleep &  limit to 5 days /week to avoid Addiction & Dementia (Dx: g47.0)   amphetamine-dextroamphetamine (ADDERALL) 10 MG tablet Take  1/2 - 1 tablet  1 or 2 x /day  for ADD   aspirin EC 81 MG tablet Take 81 mg by mouth daily.   augmented betamethasone dipropionate (DIPROLENE-AF) 0.05 % cream Apply topically 2 (two) times daily as needed.   botulinum toxin Type A (BOTOX) 100 units SOLR injection Inject 300 Units into the muscle every 3 (three) months.   BREO ELLIPTA 100-25 MCG/INH AEPB USE 1 INHALATION DAILY TO PREVENT ASTHMA   buPROPion (WELLBUTRIN XL) 150 MG 24 hr tablet TAKE 1 TABLET EVERY MORNING FOR MOOD. FOCUS & CONCENTRATION   butalbital-acetaminophen-caffeine (FIORICET) 50-325-40 MG tablet Take 1 tablet by mouth daily as needed for headache. Do not refill in less than 30 days,   cetirizine (ZYRTEC) 10 MG tablet Take 10 mg by mouth daily.   Diclofenac  Potassium,Migraine, (CAMBIA) 50 MG PACK Take 1 packet by mouth as needed.   Estradiol 10 MCG TABS vaginal tablet Place 10 mcg vaginally 2 (two) times a week.   fenofibrate micronized (LOFIBRA) 134 MG capsule TAKE 1 CAPSULE DAILY FOR TRIGLYCERIDES (BLOOD FATS)   levothyroxine (SYNTHROID) 88 MCG tablet TAKE 1 TABLET DAILY ON AN EMPTY STOMACH WITH ONLY WATER FOR 30 MINUTES & NO ANTACID MEDS, CALCIUM OR MAGNESIUM FOR 4 HOURS & AVOID  BIOTIN   magnesium oxide (MAG-OX) 400 MG tablet Take 400 mg by mouth 2 (two) times daily.    montelukast (SINGULAIR) 10 MG tablet TAKE 1 TABLET BY MOUTH AT BEDTIME FOR ALLERGIES   Multiple Vitamin (MULTIVITAMIN WITH MINERALS) TABS tablet Take 1 tablet by mouth daily.   Multiple Vitamins-Minerals (ZINC PO) Take 1 tablet by mouth daily.   nortriptyline (PAMELOR) 25 MG capsule Take 1 capsule (25 mg total) by mouth at bedtime.   omeprazole (PRILOSEC) 40 MG capsule TAKE 1 CAPSULE DAILY FOR ACID INDIGESTION & REFLUX   ondansetron (ZOFRAN ODT) 8 MG disintegrating tablet Dissolve 1 tablet under tongue every 6 to 8 hours for nausea  or vomitting   Riboflavin 100 MG TABS Take 100 mg by mouth. bid   rosuvastatin (CRESTOR) 40 MG tablet Take  1 tablet  Daily  for Cholesterol   Ubrogepant (UBRELVY) 100 MG TABS Take 100 mg by mouth as needed.   verapamil (CALAN-SR) 240 MG CR tablet TAKE 1 TABLET DAILY WITH FOOD FOR BP & MIGRAINE PREVENTION   vitamin B-12 (CYANOCOBALAMIN) 1000 MCG tablet Take 1,000 mcg by mouth daily.   Vitamin D, Ergocalciferol, (DRISDOL) 1.25 MG (50000 UT) CAPS capsule Take 1 capsule daily for Vitamin D deficiency. (Patient taking differently: Take 50,000 Units by mouth every 7 (seven) days.)   No current facility-administered medications on file prior to visit.    ROS: all negative except above.   Physical Exam:  There were no vitals taken for this visit.  General Appearance: Well nourished, in no apparent distress. Eyes: PERRLA, EOMs, conjunctiva no swelling or erythema Sinuses: No Frontal/maxillary tenderness ENT/Mouth: Ext aud canals clear, TMs without erythema, bulging. No erythema, swelling, or exudate on post pharynx.  Tonsils not swollen or erythematous. Hearing normal.  Neck: Supple, thyroid normal.  Respiratory: Respiratory effort normal, BS equal bilaterally without rales, rhonchi, wheezing or stridor.  Cardio: RRR with no MRGs. Brisk peripheral pulses without edema.   Abdomen: Soft, + BS.  Non tender, no guarding, rebound, hernias, masses. Lymphatics: Non tender without lymphadenopathy.  Musculoskeletal: Full ROM, 5/5 strength, normal gait.  Skin: Warm, dry without rashes, lesions, ecchymosis.  Neuro: Cranial nerves intact. Normal muscle tone, no cerebellar symptoms. Sensation intact.  Psych: Awake and oriented X 3, normal affect, Insight and Judgment appropriate.     Dan Maker, NP 1:49 PM [facility]

## 2020-09-09 NOTE — Telephone Encounter (Signed)
Please call pt has a  question about the Cambia. [phone]

## 2020-09-09 NOTE — Telephone Encounter (Signed)
The patient is no longer using Cambia. [name] is working excellent for her migraine rescue medication. She is happy with this treatment.

## 2020-09-10 ENCOUNTER — Other Ambulatory Visit: Payer: Self-pay | Admitting: Neurology

## 2020-09-10 ENCOUNTER — Ambulatory Visit: Payer: BC Managed Care – PPO | Admitting: Adult Health

## 2020-09-10 ENCOUNTER — Telehealth: Payer: Self-pay | Admitting: *Deleted

## 2020-09-10 NOTE — Telephone Encounter (Signed)
Pt's husband called and was informed. He states they will pick it up today.

## 2020-09-10 NOTE — Telephone Encounter (Signed)
Pt [employer] form ready for p/u

## 2020-09-17 ENCOUNTER — Other Ambulatory Visit: Payer: Self-pay

## 2020-09-17 ENCOUNTER — Encounter: Payer: Self-pay | Admitting: Adult Health

## 2020-09-17 ENCOUNTER — Ambulatory Visit (INDEPENDENT_AMBULATORY_CARE_PROVIDER_SITE_OTHER): Payer: BC Managed Care – PPO | Admitting: Adult Health

## 2020-09-17 VITALS — BP 114/72 | HR 90 | Temp 97.5°F | Wt 166.0 lb

## 2020-09-17 DIAGNOSIS — G4700 Insomnia, unspecified: Secondary | ICD-10-CM | POA: Diagnosis not present

## 2020-09-17 DIAGNOSIS — R5383 Other fatigue: Secondary | ICD-10-CM | POA: Diagnosis not present

## 2020-09-17 MED ORDER — GABAPENTIN 100 MG PO CAPS: ORAL_CAPSULE | ORAL | 0 refills | Status: DC

## 2020-09-17 NOTE — Progress Notes (Signed)
Assessment and Plan:  Ruth Gregory was seen today for acute visit.  Diagnoses and all orders for this visit:  Insomnia, unspecified type Insomnia- good sleep hygiene discussed, increase day time activity, benadryl if this does not help try low dose benadryl for sleep and mild hot flashes Follow up in 1 month with progress.  -     gabapentin (NEURONTIN) 100 MG capsule; Take 1-3 caps 1 hour prior to bedtime as needed for sleep and hot flashes.  Fatigue, unspecified type Suspect secondary to above, but discussed with patient and her preference will check some basic labs;  Otherwise benign history and exam  -     CBC with Differential/Platelet -     COMPLETE METABOLIC PANEL WITH GFR -     TSH -     Vitamin B12  Further disposition pending results of labs. Discussed med's effects and SE's.   Over 30 minutes of exam, counseling, chart review, and critical decision making was performed.   Future Appointments  Date Time Provider Department Center  10/15/2020  2:30 PM Levert Feinstein, MD [facility] None  11/03/2020 11:00 AM Lucky Cowboy, MD [facility] None    ------------------------------------------------------------------------------------------------------------------   HPI BP 114/72   Pulse 90   Temp (!) 97.5 F (36.4 C)   Wt 166 lb (75.3 kg)   SpO2 95%   BMI 24.87 kg/m   60 y.o.female with hx of hypothyroid, htn, anxiety/ADD presents for evaluation of fatigue.   She reports for 2-3 months has been having more difficulty sleeping, reports has had some ongoing hot flashes following menopause, but following stressful wedding for her son in April has been having more difficulty sleeping, will wake up hot and sweaty (not drenching, sweats are actually improved, denies drenching night sweats, never smoker, denies dyspnea or cough), then will have difficulty falling back asleep.   She does have xanax 0.5 mg and will take occasionally, feels bad after taking and prefers to avoid, but notes fatigue  is improved the day after she rests well. She tried melatonin oil without benefit.   She has been on wellbutrin 150 mg daily for many years, also adderall 10 mg PRN, tries to limit use.   She takes 88 mcg daily, reports has been advised in the past to skip Sat/Sun but has been taking daily.   Lab Results  Component Value Date   TSH 1.37 06/30/2020     Past Medical History:  Diagnosis Date   Anemia    Anxiety    Asthma    Chronic heartburn    Depression    Elevated cholesterol    on Crestor   Headache(784.0)    Hypertension    Hypothyroidism    S/P thyroidectomy   Migraine    Occlusion and stenosis of carotid artery with cerebral infarction    Spasm of muscle    Unspecified cerebral artery occlusion with cerebral infarction    Vitamin D deficiency      Allergies  Allergen Reactions   Penicillins Anaphylaxis and Hives   Imitrex [Sumatriptan]    Other Swelling    kiwi   Sulfa Antibiotics Hives   Zomig [Zolmitriptan]     Current Outpatient Medications on File Prior to Visit  Medication Sig   albuterol (PROVENTIL HFA) 108 (90 Base) MCG/ACT inhaler 1 to 2 inhalations 10-15 minutes apart every 4 hours if needed for asthma rescue   ALPRAZolam (XANAX) 0.5 MG tablet Take 1/2 - 1 tablet at Bedtime  ONLY if needed forSleep &  limit to 5 days /week to avoid Addiction & Dementia (Dx: g47.0)   amphetamine-dextroamphetamine (ADDERALL) 10 MG tablet Take  1/2 - 1 tablet  1 or 2 x /day  for ADD   aspirin EC 81 MG tablet Take 81 mg by mouth daily.   augmented betamethasone dipropionate (DIPROLENE-AF) 0.05 % cream Apply topically 2 (two) times daily as needed.   botulinum toxin Type A (BOTOX) 100 units SOLR injection Inject 300 Units into the muscle every 3 (three) months.   BREO ELLIPTA 100-25 MCG/INH AEPB USE 1 INHALATION DAILY TO PREVENT ASTHMA   buPROPion (WELLBUTRIN XL) 150 MG 24 hr tablet TAKE 1 TABLET EVERY MORNING FOR MOOD. FOCUS & CONCENTRATION    butalbital-acetaminophen-caffeine (FIORICET) 50-325-40 MG tablet Take 1 tablet by mouth daily as needed for headache. Do not refill in less than 30 days,   cetirizine (ZYRTEC) 10 MG tablet Take 10 mg by mouth daily.   Estradiol 10 MCG TABS vaginal tablet Place 10 mcg vaginally 2 (two) times a week.   fenofibrate micronized (LOFIBRA) 134 MG capsule TAKE 1 CAPSULE DAILY FOR TRIGLYCERIDES (BLOOD FATS)   levothyroxine (SYNTHROID) 88 MCG tablet TAKE 1 TABLET DAILY ON AN EMPTY STOMACH WITH ONLY WATER FOR 30 MINUTES & NO ANTACID MEDS, CALCIUM OR MAGNESIUM FOR 4 HOURS & AVOID BIOTIN   magnesium oxide (MAG-OX) 400 MG tablet Take 400 mg by mouth 2 (two) times daily.    montelukast (SINGULAIR) 10 MG tablet TAKE 1 TABLET BY MOUTH AT BEDTIME FOR ALLERGIES   Multiple Vitamin (MULTIVITAMIN WITH MINERALS) TABS tablet Take 1 tablet by mouth daily.   Multiple Vitamins-Minerals (ZINC PO) Take 1 tablet by mouth daily.   nortriptyline (PAMELOR) 25 MG capsule Take 1 capsule (25 mg total) by mouth at bedtime.   omeprazole (PRILOSEC) 40 MG capsule TAKE 1 CAPSULE DAILY FOR ACID INDIGESTION & REFLUX   ondansetron (ZOFRAN ODT) 8 MG disintegrating tablet Dissolve 1 tablet under tongue every 6 to 8 hours for nausea  or vomitting   Riboflavin 100 MG TABS Take 100 mg by mouth. bid   rosuvastatin (CRESTOR) 40 MG tablet Take  1 tablet  Daily  for Cholesterol   Ubrogepant (UBRELVY) 100 MG TABS Take 100 mg by mouth as needed.   verapamil (CALAN-SR) 240 MG CR tablet TAKE 1 TABLET DAILY WITH FOOD FOR BP & MIGRAINE PREVENTION   vitamin B-12 (CYANOCOBALAMIN) 1000 MCG tablet Take 1,000 mcg by mouth daily.   Vitamin D, Ergocalciferol, (DRISDOL) 1.25 MG (50000 UT) CAPS capsule Take 1 capsule daily for Vitamin D deficiency. (Patient taking differently: Take 50,000 Units by mouth every 7 (seven) days.)   No current facility-administered medications on file prior to visit.    ROS: Review of Systems  Constitutional:  Positive for  diaphoresis (hot flashes at night, ongoing since menopause, has improved) and malaise/fatigue. Negative for chills, fever and weight loss.  HENT: Negative.    Eyes: Negative.   Respiratory:  Negative for cough, sputum production, shortness of breath and wheezing.   Cardiovascular:  Negative for chest pain, palpitations, leg swelling and PND.  Gastrointestinal: Negative.  Negative for abdominal pain, blood in stool, constipation, diarrhea, melena, nausea and vomiting.  Genitourinary: Negative.   Musculoskeletal:  Negative for joint pain and myalgias.  Skin: Negative.  Negative for rash.  Neurological:  Negative for dizziness, tingling, sensory change, weakness and headaches (unchanged from baseline, controlled).  Endo/Heme/Allergies:  Positive for environmental allergies. Negative for polydipsia.  Psychiatric/Behavioral:  Negative for depression, substance abuse and  suicidal ideas. The patient is nervous/anxious and has insomnia.     Physical Exam:  BP 114/72   Pulse 90   Temp (!) 97.5 F (36.4 C)   Wt 166 lb (75.3 kg)   SpO2 95%   BMI 24.87 kg/m   General Appearance: Well nourished, in no apparent distress. Eyes: PERRLA, EOMs, conjunctiva no swelling or erythema Sinuses: No Frontal/maxillary tenderness ENT/Mouth: Ext aud canals clear, TMs without erythema, bulging. No erythema, swelling, or exudate on post pharynx.  Tonsils not swollen or erythematous. Hearing normal.  Neck: Supple, thyroid normal.  Respiratory: Respiratory effort normal, BS equal bilaterally without rales, rhonchi, wheezing or stridor.  Cardio: RRR with no MRGs. Brisk peripheral pulses without edema.  Abdomen: Soft, + BS.  Non tender, no guarding, rebound, hernias, masses. Lymphatics: Non tender without lymphadenopathy.  Musculoskeletal: Full ROM, 5/5 strength, normal gait.  Skin: Warm, dry without rashes, lesions, ecchymosis.  Neuro: Cranial nerves intact. Normal muscle tone, no cerebellar symptoms. Sensation  intact.  Psych: Awake and oriented X 3, normal affect, Insight and Judgment appropriate.     Dan Maker, NP 4:14 PM [facility]

## 2020-09-18 ENCOUNTER — Encounter: Payer: Self-pay | Admitting: Adult Health

## 2020-09-18 DIAGNOSIS — E538 Deficiency of other specified B group vitamins: Secondary | ICD-10-CM | POA: Insufficient documentation

## 2020-09-18 LAB — COMPLETE METABOLIC PANEL WITH GFR
AG Ratio: 2 (calc) (ref 1.0–2.5)
ALT: 29 U/L (ref 6–29)
AST: 23 U/L (ref 10–35)
Albumin: 4.6 g/dL (ref 3.6–5.1)
Alkaline phosphatase (APISO): 78 U/L (ref 37–153)
BUN: 18 mg/dL (ref 7–25)
CO2: 28 mmol/L (ref 20–32)
Calcium: 10.2 mg/dL (ref 8.6–10.4)
Chloride: 104 mmol/L (ref 98–110)
Creat: 0.99 mg/dL (ref 0.50–0.99)
GFR, Est African American: 71 mL/min/{1.73_m2} (ref >=60)
GFR, Est Non African American: 61 mL/min/{1.73_m2} (ref >=60)
Globulin: 2.3 g/dL (calc) (ref 1.9–3.7)
Glucose, Bld: 85 mg/dL (ref 65–99)
Potassium: 4.3 mmol/L (ref 3.5–5.3)
Sodium: 141 mmol/L (ref 135–146)
Total Bilirubin: 0.4 mg/dL (ref 0.2–1.2)
Total Protein: 6.9 g/dL (ref 6.1–8.1)

## 2020-09-18 LAB — CBC WITH DIFFERENTIAL/PLATELET
Absolute Monocytes: 331 cells/uL (ref 200–950)
Basophils Absolute: 29 cells/uL (ref 0–200)
Basophils Relative: 0.6 %
Eosinophils Absolute: 163 cells/uL (ref 15–500)
Eosinophils Relative: 3.4 %
HCT: 44.6 % (ref 35.0–45.0)
Hemoglobin: 15.1 g/dL (ref 11.7–15.5)
Lymphs Abs: 1622 cells/uL (ref 850–3900)
MCH: 32.2 pg (ref 27.0–33.0)
MCHC: 33.9 g/dL (ref 32.0–36.0)
MCV: 95.1 fL (ref 80.0–100.0)
MPV: 10.9 fL (ref 7.5–12.5)
Monocytes Relative: 6.9 %
Neutro Abs: 2654 cells/uL (ref 1500–7800)
Neutrophils Relative %: 55.3 %
Platelets: 218 10*3/uL (ref 140–400)
RBC: 4.69 10*6/uL (ref 3.80–5.10)
RDW: 12.5 % (ref 11.0–15.0)
Total Lymphocyte: 33.8 %
WBC: 4.8 10*3/uL (ref 3.8–10.8)

## 2020-09-18 LAB — VITAMIN B12: Vitamin B-12: 352 pg/mL (ref 200–1100)

## 2020-09-18 LAB — TSH: TSH: 1.7 mIU/L (ref 0.40–4.50)

## 2020-09-24 ENCOUNTER — Ambulatory Visit (INDEPENDENT_AMBULATORY_CARE_PROVIDER_SITE_OTHER): Payer: BC Managed Care – PPO | Admitting: Family Medicine

## 2020-09-24 ENCOUNTER — Other Ambulatory Visit: Payer: Self-pay

## 2020-09-24 ENCOUNTER — Ambulatory Visit: Payer: Self-pay

## 2020-09-24 ENCOUNTER — Encounter: Payer: Self-pay | Admitting: Family Medicine

## 2020-09-24 VITALS — Ht 69.0 in | Wt 167.0 lb

## 2020-09-24 DIAGNOSIS — M25562 Pain in left knee: Secondary | ICD-10-CM | POA: Diagnosis not present

## 2020-09-24 DIAGNOSIS — M25511 Pain in right shoulder: Secondary | ICD-10-CM

## 2020-09-24 DIAGNOSIS — M25561 Pain in right knee: Secondary | ICD-10-CM

## 2020-09-24 NOTE — Progress Notes (Signed)
Office Visit Note   Patient: Ruth Gregory           Date of Birth: 1960/05/17           MRN: [id number] Visit Date: 09/24/2020 Requested by: Lucky Cowboy, MD [address] [place],  [place] [address] PCP: Lucky Cowboy, MD  Subjective: Chief Complaint  Patient presents with   Right Hip - Pain    Patient fell on 09/20/2020. She was carrying plates in and was coming in over raised threshold in the doorway and caught foot. She fell onto both knees, her right hip, and her right elbow. She has some bruising right lower leg and right hip. She has some pain in the right shoulder and arm and wonders if it is because she hit her elbow and "jarred" the arm up to the shoulder. She is weak on the left side due to paralysis she has had.    Right Knee - Pain   Left Knee - Pain   Right Upper Arm - Pain    HPI: 60yo F presenting to clinic with right shoulder and knee pain after falling over the weekend. Patient says she tripped over a door threshold, and landed on her right side. Initially, she felt a little sore, but nothing too severe. The next morning she felt much more sore, and decided to make the appointment. Over the past few days, she feels like she has steadily improved, but is worried because she had had similar falls in the past, with subsequent fractures. The majority of her pain is over the right shoulder. She has a history of a significant stroke, which left her much weaker on the left side. She has done extensive rehabilitation, and is very functional, however it will often cause her to fall if she is not diligently thinking about her gait.  She has used Advil, Ice for pain since her fall. She says she is otherwise doing well.                Objective: Vital Signs: Ht 5\' 9"  (1.753 m)   Wt 167 lb (75.8 kg)   BMI 24.66 kg/m   Physical Exam:  General:  Alert and oriented, in no acute distress. Pulm:  Breathing unlabored. Psy:  Normal mood, congruent  affect. Skin:  4x4cm area of bruising over right lateral knee, directly over fibular head. No tenderness along the length of the fibula. No bruising, rashes, or other lesions over right arm/shoulder.  Right Shoulder Exam:  Inspection: Symmetric muscle mass, no atrophy or deformity, no scars. Palpation: Endorses mild tenderness to palpation throughout the shoulder, though no overt pin-point bony tenderness. No pain with palpation along the length of the humerus, radius, or ulna.   Range of motion: Full range of motion in forward flexion, abduction and extension.  Full External rotation to 90.   Rotator cuff testing:  Full strength and mild pain with empty can (supraspinatus).  External and internal rotation with full strength and no pain.  AC joint testing: No AC tenderness to palpation, negative scarf test. Negative active compression test.  Labral /Biceps testing: O'Brien's/speeds with full strength and no pain. Crank test: Negative  Impingement testing: No pain with Neer's or Hawkins  Strength testing:  5 out of 5 strength with shoulder abduction (C5), wrist extension (C6), wrist flexion (C7), grip strength (C8), and finger abduction (T1) on right. Decreased wrist extension to 4/5 on left.  Sensation: Intact to light touch throughout bilateral upper extremities.  Brisk distal capillary refill.  Right lower leg with no tenderness along fibula, lateral knee. No pain with patellar compression. No effusion.    Imaging: Shoulder XR with no evidence of fracture or other acute bony abnormality. Dexa Scan reviewed, which demonstrated Osteopenia without osteoporosis.    Assessment & Plan: 60yo F presenting to clinic 4 days following a ground-level fall over the weekend. No evidence for acute fracture, and suspect she is simply bruised. Should improve within a few days of rest, ice, and NSAID therapy. Patient is agreeable with plan. If symptoms worsen or fail to improve, she is to RTC  for reevaluation.      Procedures: No procedures performed        PMFS History: Patient Active Problem List   Diagnosis Date Noted   B12 deficiency 09/18/2020   Chronic migraine w/o aura w/o status migrainosus, not intractable 04/15/2020   Spastic hemiplegia of left nondominant side as late effect of cerebrovascular disease (HCC) 06/18/2019   Abnormal glucose 07/27/2018   Labile hypertension 07/27/2018   Fatty liver 10/09/2017   GERD (gastroesophageal reflux disease) 10/07/2017   Anxiety 10/07/2017   Hypothyroidism 07/23/2015   Vitamin D deficiency 10/03/2013   Medication management 10/03/2013   History of prediabetes 10/03/2013   ADD (attention deficit disorder) without hyperactivity 10/03/2013   Hyperlipidemia, mixed 04/04/2013   Sinusitis, chronic 04/04/2013   Chronic migraine 09/06/2012   Spastic hemiplegia affecting left nondominant side (HCC) 09/06/2012   History of CVA (cerebrovascular accident)    Past Medical History:  Diagnosis Date   Anemia    Anxiety    Asthma    Chronic heartburn    Depression    Elevated cholesterol    on Crestor   Headache(784.0)    Hypertension    Hypothyroidism    S/P thyroidectomy   Migraine    Occlusion and stenosis of carotid artery with cerebral infarction    Spasm of muscle    Unspecified cerebral artery occlusion with cerebral infarction    Vitamin D deficiency     Family History  Problem Relation Age of Onset   Heart disease Mother    Peptic Ulcer Disease Mother    Heart disease Father    Parkinson's disease Father     Past Surgical History:  Procedure Laterality Date   AUGMENTATION MAMMAPLASTY Bilateral 2000   COSMETIC SURGERY     CYSTOCELE REPAIR N/A 01/08/2013   Procedure: ANTERIOR REPAIR (CYSTOCELE);  Surgeon: Meriel Pica, MD;  Location: [facility];  Service: Gynecology;  Laterality: N/A;   NASAL SINUS SURGERY     VAGINAL HYSTERECTOMY     Social History   Occupational History   Occupation: DESIGNER     Employer: [employer]  Tobacco Use   Smoking status: Never   Smokeless tobacco: Never  Substance and Sexual Activity   Alcohol use: Yes    Comment: occassionally drinks a glass of wine   Drug use: No   Sexual activity: Not on file

## 2020-09-24 NOTE — Progress Notes (Signed)
I saw and examined the patient with Dr. Marga Hoots and agree with assessment and plan as outlined.    Recent fall with knee contusions and right elbow contusion, jammed right shoulder.    Exam reveals good shoulder ROM with intact rotator cuff function.  X-Rays negative for obvious fracture.  Reassurance.  Return as needed.

## 2020-10-15 ENCOUNTER — Ambulatory Visit (INDEPENDENT_AMBULATORY_CARE_PROVIDER_SITE_OTHER): Payer: BC Managed Care – PPO | Admitting: Neurology

## 2020-10-15 ENCOUNTER — Encounter: Payer: Self-pay | Admitting: Neurology

## 2020-10-15 VITALS — BP 143/83 | HR 92 | Ht 69.0 in | Wt 165.0 lb

## 2020-10-15 DIAGNOSIS — G43709 Chronic migraine without aura, not intractable, without status migrainosus: Secondary | ICD-10-CM

## 2020-10-15 DIAGNOSIS — I69954 Hemiplegia and hemiparesis following unspecified cerebrovascular disease affecting left non-dominant side: Secondary | ICD-10-CM | POA: Diagnosis not present

## 2020-10-15 NOTE — Progress Notes (Signed)
**  Botox 100 units x 3 vials, NDC 9735-3299-24, Lot [id number], Exp 07/2022, office supply.//mck,rn**

## 2020-10-15 NOTE — Progress Notes (Signed)
HISTORICAL  Ruth Gregory is a 60 year old right-handed female  History of right internal carotid section with spastic left hemiparesis, Botox injection every 3 months  She has history of right internal carotid artery dissection following an episode of vomiting from migraine headache in mid June 2009. She was given IV TPA and found to have right MCA occlusion. She underwent emergent right carotid artery stent with distal endovascular recanalization of the middle cerebral artery.  She had small hemorrhagic transformation of the right basal ganglia with mild left hemiparesis, but has done well since then and has been independent.  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  She has been receiving BOTOX injection since 01/2009 every 3 months, to her left upper extremity and also left lower extremity, responded well.  Repeat US carotid were normal in October 2013  Chronic migraine headaches: She had long-standing history of chronic migraine, average 2-4 headaches each months, she has increased frequency headaches around fall, and spring, she is now taking Topamax ER 150 mg every night as preventive medication, magnesium oxide 400 mg, riboflavin 100 mg twice a day,  She is not candidate for triptan because previous history of stroke, Cambia works well for her, she her insurance only allow her to have 9 tablets each months. Occasionally she take Fioricet, hydrocodone as needed, even prednisone  20 mg as needed as rescue therapy  Over the past few weeks, she been having to 3 headaches each week, used out her cambia, she also complains of neck muscle tightness, receiving chiropractor sometimes   Last Botox injection to spastic left upper extremity works well, but she noticed left hand muscle weakness.  UPDATE Mar 26 2015: She responded very well to previous Botox injection in September 2016, but noticed increased left hand weakness, gradually recovered over the past few weeks  Update July 15 2015: She responded very well to previous Botox injection in December 20 eighth 2016, noticed returning of left finger flexion spasticity, left ankle plantarflexion, want today's injection emphasize on above abnormal posturing.  She still has frequent migraine headaches, 1-2 headache each week, some headache was protracted, lasting 1-2 days, require multiple dose of cambia and Fioricet  Update November 20 2015: Patient called in September 09 2015 "She also said the left hand is very weak- 12,3 digits > 4,5. She said last OV she rec'd botox in wrist. She sts she may not be able to get botox next month if she is not feeling better"  She has a lot of pain on her left leg, mainly her left ankle from knee down, throbbing, tooth ache, she noticed more tendency of left ankle plantar flexion.  She continue have intermittent migraine headaches, more at the left retro-orbital  Area  UPDATE Dec 6th 2017: She responded very well to previous Botox injection, complains frequent migraine headaches, once a week, lasting 1-2 days, partial response to combination of cambia, Fioricet, Compazine, she is taking Topamax ER 150 mg as preventive medications  UPDATE June 30 2016: She had  her first Botox injection for migraine prevention in December 2017, with only used 75 units she reported dramatic improvement, she only has migraines about once a week, which is milder, better controlled by the medication, instead of 2-3 times  more severe headaches, at the end of the benefit, she noticed recurrent of her headaches, she also noticed mild left shoulder stiffness, left ankle turned in,  She noticed worsening right knee pain, recently had right knee injection.  Update November 04 2016: She responded well to previous injection, Botox injection to bilateral frontal temporal region has helped her headache,  UPDATE Mar 11 2017: She responded well to previous injection in August 2018.  Reported increased migraine headache over the past few weeks,  UPDATE Aug 08 2017: She is having migraine almost every week, lasting for 2 days, she is hesitate   UPDATE Sept 30 2019: She missed her appointment in August, she was given toradol and pheneran by pcp recently for protracted migraine headache, which did help her headache, but less effective than Depacon  She continue have 1 or 2 migraine headaches 1 to 2 weeks, also noticed increased left ankle spasticity, unsteady gait  UPDATE Mar 23 2018: She still has frequent headaches, want to explore the possibility of CGRP, responded very well to previous injection,  UPDATE Aug 23 2018: She responded very well to previous injection, only has occasional migraine headaches  UPDATE Sept 2 2020: She responded well to previous injection, had occasionally migraine headaches, Cambia works well for her, left hand finger spasm has much improved, she continue has mild left ankle plantarflexion,  Update March 15, 2019: She responded well to previous injection, increased migraine due to her Covid infection recently  UPDATE June 18 2019: She developed some left finger weakness following previous injection in December, recovered, doing well,  She also reported 1 episode of low while driving, made the wrong turn, she denied loss of consciousness, still has frequent migraine headaches  UPDATE September 26 2019: She did well with previous injection, especially the left hand, we used Botox 400 units  today  UPDATE December 26, 2019: She elected to receive last injection for left forearm at previous injection in June 2021, over the past few weeks, noticed worsening left hand  cupping, spasm,  She also have frequent migraine headaches,  Update April 09, 2020 She return for EMG guided botulism toxin injection for spastic left upper and lower extremity, migraine headaches, she fell, suffered fracture at the left elbow, recovering well  Update July 09, 2020: She has been doing very well, [name] works very well for her migraine headaches, use around once or twice each week, responding well  UPDATE October 16 2020: She is overall doing well, no longer have significant migraine headaches, Ubrelvy as needed was helpful,  Review of system: Pertinent as above   ALLERGIES: Allergies  Allergen Reactions   Penicillins Anaphylaxis and Hives   Imitrex [Sumatriptan]    Other Swelling    kiwi   Sulfa Antibiotics Hives   Zomig [Zolmitriptan]     HOME MEDICATIONS: Current Outpatient Medications  Medication Sig Dispense Refill   albuterol (PROVENTIL HFA) 108 (90 Base) MCG/ACT inhaler 1 to 2 inhalations 10-15 minutes apart every 4 hours if needed for asthma rescue 48 g 3   ALPRAZolam (XANAX) 0.5 MG tablet Take 1/2-1 tablet at Bedtime ONLY if needed for Sleep 30 tablet 0   amphetamine-dextroamphetamine (ADDERALL) 10 MG tablet Take 1/2 to 1 tablet 1 or 2 x daily for ADD  60 tablet 0   aspirin EC 81 MG tablet Take 81 mg by mouth daily.     augmented betamethasone dipropionate (DIPROLENE-AF) 0.05 % cream Apply topically 2 (two) times daily as needed.     Botulinum Toxin Type A (BOTOX) 200 UNITS SOLR Inject 400 Units as directed every 3 (three) months. Change to BOTOX A 100  units     BREO ELLIPTA 100-25 MCG/INH AEPB 1 INHALATION DAILY TO PREVENT ASTHMA 180 each 3   buPROPion (WELLBUTRIN XL) 150 MG 24 hr tablet TAKE 1 TABLET EVERY MORNING FOR MOOD. FOCUS & CONCENTRATION 30 tablet 2    butalbital-acetaminophen-caffeine (FIORICET) 50-325-40 MG tablet Take 1 tablet by mouth daily as needed for headache. Do not refill in less than 30 days, 12 tablet 5   cetirizine (ZYRTEC) 10 MG tablet Take 10 mg by mouth daily.     dexamethasone (DECADRON) 4 MG tablet Take 1 tab 3 x day - 3 days, then 2 x day - 3 days, then 1 tab daily 20 tablet 0   Diclofenac Potassium,Migraine, (CAMBIA) 50 MG PACK MIX 1 PACK WITH 1-2 OUNCES OF WATER AND DRINK AT ONSET OF MIGRAINE CAN REPEAT IN 2 HOURS IF NEEDED MAX 2/24 HOURS 8 each 11   diclofenac Sodium (VOLTAREN) 1 % GEL Apply 4 g topically 4 (four) times daily as needed. 500 g 6   Estradiol (YUVAFEM) 10 MCG TABS vaginal tablet Place 10 mcg vaginally 2 (two) times a week.     fenofibrate micronized (LOFIBRA) 134 MG capsule TAKE 1 CAPSULE EVERY DAY 90 capsule 3   levothyroxine (SYNTHROID) 88 MCG tablet Take 1 tablet daily on an empty stomach with only water for 30 minutes & no Antacid meds, Calcium or Magnesium for 4 hours & avoid Biotin 90 tablet 0   magnesium oxide (MAG-OX) 400 MG tablet Take 400 mg by mouth 2 (two) times daily.      montelukast (SINGULAIR) 10 MG tablet Take 1 tablet Daily  for Allergies. 90 tablet 3   Multiple Vitamin (MULTIVITAMIN WITH MINERALS) TABS tablet Take 1 tablet by mouth daily.     Multiple Vitamins-Minerals (ZINC PO) Take 1 tablet by mouth daily.     nortriptyline (PAMELOR) 25 MG capsule Take 25 mg by mouth at bedtime.     omeprazole (PRILOSEC) 40 MG capsule TAKE 1 CAPSULE DAILY FOR ACID INDIGESTION & REFLUX 90 capsule 3   ondansetron (ZOFRAN ODT) 8 MG disintegrating tablet Dissolve 1 tablet under tongue every 6 to 8 hours for nausea  or vomitting 30 tablet 0   Riboflavin 100 MG TABS Take 100 mg by mouth. bid     rosuvastatin (CRESTOR) 40 MG tablet Take 1 tablet (40 mg total) by mouth daily. Take 1 tablet Daily for Cholesterol 90 tablet 3   topiramate (TOPAMAX) 100 MG tablet Take 2 tablets (200 mg total) by mouth daily. 180 tablet  3   verapamil (CALAN-SR) 240 MG CR tablet TAKE 1 TABLET DAILY WITH FOOD FOR BP & MIGRAINE PREVENTION 90 tablet 3   vitamin B-12 (CYANOCOBALAMIN) 1000 MCG tablet Take 1,000 mcg by mouth daily.     Vitamin D, Ergocalciferol, (DRISDOL) 1.25 MG (50000 UT) CAPS capsule Take 1 capsule daily for Vitamin D deficiency. 90 capsule 1   Erenumab-aooe (AIMOVIG) 70 MG/ML SOAJ Inject 70 mg into the skin every 30 (thirty) days. 1 mL 11   Ubrogepant (UBRELVY) 50 MG TABS Take 50 mg by mouth as needed. May repeat once in 2 hours 12 tablet 11  No current facility-administered medications for this visit.    PAST MEDICAL HISTORY: Past Medical History:  Diagnosis Date   Anemia    Anxiety    Asthma    Chronic heartburn    Depression    Elevated cholesterol    on Crestor   Headache(784.0)    Hypertension    Hypothyroidism    S/P thyroidectomy   Migraine    Occlusion and stenosis of carotid artery with cerebral infarction    Spasm of muscle    Unspecified cerebral artery occlusion with cerebral infarction    Vitamin D deficiency     PAST SURGICAL HISTORY: Past Surgical History:  Procedure Laterality Date   AUGMENTATION MAMMAPLASTY Bilateral 2000   COSMETIC SURGERY     CYSTOCELE REPAIR N/A 01/08/2013   Procedure: ANTERIOR REPAIR (CYSTOCELE);  Surgeon: Meriel Picaichard M Holland, MD;  Location: [facility];  Service: Gynecology;  Laterality: N/A;   NASAL SINUS SURGERY     VAGINAL HYSTERECTOMY      FAMILY HISTORY: Family History  Problem Relation Age of Onset   Heart disease Mother    Peptic Ulcer Disease Mother    Heart disease Father    Parkinson's disease Father     SOCIAL HISTORY:  Social History   Socioeconomic History   Marital status: Married    Spouse name: Brett CanalesSteve   Number of children: 2   Years of education: college   Highest education level: Not on file  Occupational History   Occupation: DESIGNER    Employer: SELF  Tobacco Use   Smoking status: Never Smoker   Smokeless tobacco:  Never Used  Substance and Sexual Activity   Alcohol use: Yes    Comment: occassionally drinks a glass of wine   Drug use: No   Sexual activity: Not on file  Other Topics Concern   Not on file  Social History Narrative   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 Patient is married and lives with her husband. Patient has college education.   Right handed.   Caffeine- two cups daily.   Social Determinants of Health   Financial Resource Strain:    Difficulty of Paying Living Expenses: Not on file  Food Insecurity:    Worried About Programme researcher, broadcasting/film/videounning Out of Food in the Last Year: Not on file   [employer] Out of Food in the Last Year: Not on file  Transportation Needs:    Lack of Transportation (Medical): Not on file   Lack of Transportation (Non-Medical): Not on file  Physical Activity:    Days of Exercise per Week: Not on file   Minutes of Exercise per Session: Not on file  Stress:    Feeling of Stress : Not on file  Social Connections:    Frequency of Communication with Friends and Family: Not on file   Frequency of Social Gatherings with Friends and Family: Not on file   Attends Religious Services: Not on file   Active Member of Clubs or Organizations: Not on file   Attends BankerClub or Organization Meetings: Not on file   Marital Status: Not on file  Intimate Partner Violence:    Fear of Current or Ex-Partner: Not on file   Emotionally Abused: Not on file   Physically Abused: Not on file   Sexually Abused: Not on file     PHYSICAL EXAM   Vitals:   12/26/19 1427  BP: 140/84  Pulse: 92  Weight: 162 lb (73.5 kg)  Height: 5' 8.5" (1.74 m)  Body mass index is 24.27 kg/m.    NEUROLOGICAL EXAM:  MOTOR: Mild spastic left hemiparesis, left upper extremity proximal and distal 4/5, tendency for left thumb flexion, mild finger flexions She has mild left hip flexion and left ankle dorsiflexion weakness   GAIT/STANCE: Mildly unsteady gait, tendency for left ankle  plantarflexion, bearing weight on her left lateral foot    DIAGNOSTIC DATA (LABS, IMAGING, TESTING) - I reviewed patient records, labs, notes, testing and imaging myself where available.   ASSESSMENT AND PLAN  Ruth Gregory is a 60 y.o. female   Chronic migraine headaches  Continue preventive medications, nortriptyline 25 mg every night,   Ubrevyl as needed.   Also receiving Botox a 100 units as migraine prevention  Spastic left hemiparesis following right internal carotid artery dissection, right MCA stroke  EMG guided Botox injection, used 300 units of Botox A total   Left pronator teres 25 units Left flexor digitorum superficialis 25 units Left pectoralis major 25 units Left brachialis 25 units Left palmaris longus 25 units Left flexor digitorum profundus a 25 units  Left flexor digitorum longus 25 units Left tibialis posterior 25 units    I also injected  100 units for chronic migraine headache Bilateral corrugate 10 units, Prosperous 5 units Bilateral temporalis 45 units Bilateral frontalis 20 units Bilateral parietal region 20 units  We will change to Botox 300 units, Ubrelvy as needed for abortive treatment  Levert Feinstein, M.D. Ph.D.  [facility] [address], [address] [place], [place] [address] Ph: [phone] Fax: [phone]

## 2020-10-16 MED ORDER — ONABOTULINUMTOXINA 100 UNITS IJ SOLR
300.0000 [IU] | Freq: Once | INTRAMUSCULAR | Status: AC
  Administered 2020-10-16: 300 [IU] via INTRAMUSCULAR

## 2020-10-21 ENCOUNTER — Other Ambulatory Visit: Payer: Self-pay | Admitting: Internal Medicine

## 2020-10-21 ENCOUNTER — Other Ambulatory Visit: Payer: Self-pay | Admitting: Neurology

## 2020-10-21 ENCOUNTER — Other Ambulatory Visit: Payer: Self-pay | Admitting: Adult Health

## 2020-10-21 DIAGNOSIS — E782 Mixed hyperlipidemia: Secondary | ICD-10-CM

## 2020-10-21 DIAGNOSIS — G4700 Insomnia, unspecified: Secondary | ICD-10-CM

## 2020-10-23 ENCOUNTER — Ambulatory Visit: Payer: BC Managed Care – PPO | Admitting: Sports Medicine

## 2020-10-31 ENCOUNTER — Ambulatory Visit: Payer: BC Managed Care – PPO | Admitting: Family Medicine

## 2020-11-02 ENCOUNTER — Encounter: Payer: Self-pay | Admitting: Internal Medicine

## 2020-11-02 NOTE — Progress Notes (Signed)
Annual Screening/Preventative Visit & Comprehensive Evaluation &  Examination  Future Appointments  Date Time Provider Department Center  11/03/2020 11:00 AM Lucky CowboyMcKeown, Evo Aderman, MD [facility] None  11/05/2020 11:00 AM Madelyn BrunnerBrooks, Dana, DO [facility]  01/21/2021  2:30 PM Levert FeinsteinYan, Yijun, MD [facility] None  11/03/2021 11:00 AM Lucky CowboyMcKeown, Jaia Alonge, MD [facility] None        This very nice 60 y.o. MWF presents for a Screening /Preventative Visit & comprehensive evaluation and management of multiple medical co-morbidities.  Patient has been followed for HTN, HLD, Prediabetes  and Vitamin D Deficiency. Patient also is dx'd with ADD & is treated to help with her focus & concentration.        Labile HTN predates circa 2009 and is followed expectantly.  At that time she presented with a Rt MCA CVA due to a Right Carotid Artery dissection attributed to Fibromuscular Dysplasia. Patient recovered with mild residual Lt spastic hemi-paresis. Dr Terrace ArabiaYan follows the patient for Bo-Tox inj for her spasticity & also for her migraines.  Patient's BP has been controlled at home and patient denies any cardiac symptoms as chest pain, palpitations, shortness of breath, dizziness or ankle swelling. Today's BP is at goal - 124/80 .       Patient's hyperlipidemia is controlled with diet and Rosuvastatin /Fenofibrate.   Patient denies myalgias or other medication SE's. Last lipids were at goal except sl. elevated Trig's:  Lab Results  Component Value Date   CHOL 167 06/30/2020   HDL 55 06/30/2020   LDLCALC 84 06/30/2020   TRIG 187 (H) 06/30/2020   CHOLHDL 3.0 06/30/2020        In 2007, patient has subtotal Thyroidectomy for Goiter and was initiated on suppressive Replacement therapy.       Patient has hx/o prediabetes predating  (A1c 5.8% /2008) and patient denies reactive hypoglycemic symptoms, visual blurring, diabetic polys or paresthesias. Last A1c was near goal:  Lab Results  Component Value Date   HGBA1C 5.7 (H) 06/30/2020          Finally, patient has history of Vitamin D Deficiency and last Vitamin D was at goal:  Lab Results  Component Value Date   VD25OH 82 03/14/2020     Current Outpatient Medications on File Prior to Visit  Medication Sig   albuterol HFA  inhaler 1 to 2 inhalations 10-15 minutes apart every 4 hours if needed for asthma rescue   ALPRAZolam 0.5 MG tablet Take 1/2 - 1 tablet at Bedtime  ONLY if needed    aspirin EC 81 MG tablet Take  daily.   DIPROLENE-AF 0.05 % cream Apply topically 2 (two) times daily as needed.   BOTOX 100 units  injection Inject 300 Units  im every 3 months.   BREO ELLIPTA 100-25  1 INHALATION DAILY    buPROPion-XL 150 MG 24  TAKE 1 TABLET EVERY MORNING    FIORICET  TAKE 1 TABLET DAILY AS NEEDED    cetirizine 10 MG tablet Take 1daily.   Estradiol 10 MCG  vaginal tab Place 10 mcg vaginally 2times a week.   fenofibrate 134 MG capsule TAKE 1 CAPSULE DAILY    gabapentin 100 MG capsule Take  1 to 3 capsules  1 hour  prior to Bedtime    levothyroxine 88 MCG tablet TAKE 1 TABLET DAILY    magnesium  400 MG tablet Take  2 times daily.    montelukast 10 MG  TAKE 1 TABLET AT BEDTIME    MULTIVITAMIN w/MINERALS Take  1 tablet daily.   Multiple Vitamins-Minerals -ZINC  Take 1 tablet daily.   Nortriptyline 25 MG capsule Take 1 capsule  at bedtime.   omeprazole  40 MG capsule TAKE 1 CAPSULE DAILY    Ondansetron-ODT 8 MG  Dissolve 1 tab under tongue every 6 to 8 hours for nausea  or vomitting   Riboflavin 100 MG TABS Take  bid   rosuvastatin (CRESTOR) 40 MG tablet Take  1 tablet  Daily  for Cholesterol   UBRELVY 100 MG TABS Take as needed.   Verapamil SR 240 MG CR tablet TAKE 1 TABLET DAILY    vitamin B-12  1000 MCG tablet Take daily.   Vitamin D  50,000 u Take 1 capsule every 7 days   ADDERALL 10 MG tablet Take  1/2 - 1 tablet  1 or 2 x /day  for ADD     Allergies  Allergen Reactions   Penicillins Anaphylaxis and Hives   Imitrex [Sumatriptan]    Other Swelling     kiwi   Sulfa Antibiotics Hives   Zomig [Zolmitriptan]      Past Medical History:  Diagnosis Date   Anemia    Anxiety    Asthma    Chronic heartburn    Depression    Elevated cholesterol    on Crestor   Headache(784.0)    Hypertension    Hypothyroidism    S/P thyroidectomy   Migraine    Occlusion and stenosis of carotid artery with cerebral infarction    Spasm of muscle    Unspecified cerebral artery occlusion with cerebral infarction    Vitamin D deficiency      Health Maintenance  Topic Date Due   Zoster Vaccines- Shingrix (1 of 2) Never done   Pneumococcal Vaccine 67-82 Years old (2 - PCV) 03/30/1999   PAP SMEAR-Modifier  05/07/2017   COVID-19 Vaccine (3 - Pfizer risk series) 08/01/2019   INFLUENZA VACCINE  10/27/2020   COLONOSCOPY  01/13/2021   MAMMOGRAM  01/03/2022   TETANUS/TDAP  05/18/2026   Hepatitis C Screening  Completed   HIV Screening  Completed   HPV VACCINES  Aged Out     Immunization History  Administered Date(s) Administered   Influenza Inj Mdck Quad  01/10/2019, 01/25/2020   Influenza Split 01/24/2014   Influenza, Seasonal 02/09/2016   Influenza 11/28/2011   PFIZER  SARS-COV-2 Vacc 06/11/2019, 07/04/2019   PPD Test 05/18/2016, 06/20/2017, 07/27/2018   Pneumococcal -23 03/29/1998   Td 03/29/2002   Tdap 05/18/2016    Last Colon -  01/14/2020 - Dr Kerin Salen cited poor colon prep &  recommended repeat in  6 months ~ April 2022 - (overdue)    Last MGM - 01/04/2020   Past Surgical History:  Procedure Laterality Date   AUGMENTATION MAMMAPLASTY Bilateral 2000   COSMETIC SURGERY     CYSTOCELE REPAIR N/A 01/08/2013   Procedure: ANTERIOR REPAIR (CYSTOCELE);  Surgeon: Meriel Pica, MD;  Location: [facility];  Service: Gynecology;  Laterality: N/A;   NASAL SINUS SURGERY     VAGINAL HYSTERECTOMY       Family History  Problem Relation Age of Onset   Heart disease Mother    Peptic Ulcer Disease Mother    Heart disease Father     Parkinson's disease Father      Social History   Tobacco Use   Smoking status: Never   Smokeless tobacco: Never  Substance Use Topics   Alcohol use: Yes    Comment: occassionally  drinks a glass of wine   Drug use: No      ROS Constitutional: Denies fever, chills, weight loss/gain, headaches, insomnia,  night sweats, and change in appetite. Does c/o fatigue. Eyes: Denies redness, blurred vision, diplopia, discharge, itchy, watery eyes.  ENT: Denies discharge, congestion, post nasal drip, epistaxis, sore throat, earache, hearing loss, dental pain, Tinnitus, Vertigo, Sinus pain, snoring.  Cardio: Denies chest pain, palpitations, irregular heartbeat, syncope, dyspnea, diaphoresis, orthopnea, PND, claudication, edema Respiratory: denies cough, dyspnea, DOE, pleurisy, hoarseness, laryngitis, wheezing.  Gastrointestinal: Denies dysphagia, heartburn, reflux, water brash, pain, cramps, nausea, vomiting, bloating, diarrhea, constipation, hematemesis, melena, hematochezia, jaundice, hemorrhoids Genitourinary: Denies dysuria, frequency, urgency, nocturia, hesitancy, discharge, hematuria, flank pain Breast: Breast lumps, nipple discharge, bleeding.  Musculoskeletal: Denies arthralgia, myalgia, stiffness, Jt. Swelling, pain, limp, and strain/sprain. Denies falls. Skin: Denies puritis, rash, hives, warts, acne, eczema, changing in skin lesion Neuro: No weakness, tremor, incoordination, spasms, paresthesia, pain Psychiatric: Denies confusion, memory loss, sensory loss. Denies Depression. Endocrine: Denies change in weight, skin, hair change, nocturia, and paresthesia, diabetic polys, visual blurring, hyper / hypo glycemic episodes.  Heme/Lymph: No excessive bleeding, bruising, enlarged lymph nodes.  Physical Exam  BP 124/80   Pulse 90   Temp (!) 97.5 F (36.4 C)   Resp 16   Ht 5\' 9"  (1.753 m)   Wt 169 lb 9.6 oz (76.9 kg)   SpO2 98%   BMI 25.05 kg/m   General Appearance: Well nourished,  well groomed and in no apparent distress.  Eyes: PERRLA, EOMs, conjunctiva no swelling or erythema, normal fundi and vessels. Sinuses: No frontal/maxillary tenderness ENT/Mouth: EACs patent / TMs  nl. Nares clear without erythema, swelling, mucoid exudates. Oral hygiene is good. No erythema, swelling, or exudate. Tongue normal, non-obstructing. Tonsils not swollen or erythematous. Hearing normal.  Neck: Supple, thyroid not palpable. No bruits, nodes or JVD. Respiratory: Respiratory effort normal.  BS equal and clear bilateral without rales, rhonci, wheezing or stridor. Cardio: Heart sounds are normal with regular rate and rhythm and no murmurs, rubs or gallops. Peripheral pulses are normal and equal bilaterally without edema. No aortic or femoral bruits. Chest: symmetric with normal excursions and percussion. Breasts: Symmetric, without lumps, nipple discharge, retractions, or fibrocystic changes.  Abdomen: Flat, soft with bowel sounds active. Nontender, no guarding, rebound, hernias, masses, or organomegaly.  Lymphatics: Non tender without lymphadenopathy.  Musculoskeletal: Full ROM all peripheral extremities, joint stability, 5/5 strength, and normal gait. Skin: Warm and dry without rashes, lesions, cyanosis, clubbing or  ecchymosis.  Neuro: Cranial nerves intact. Mild Lt hyperreflexia and increased L side tone Upper>Lower. No cerebellar symptoms. Sensation intact.  Pysch: Alert and oriented X 3, normal affect, Insight and Judgment appropriate.    Assessment and Plan  1. Annual Preventative Screening Examination  2. Labile hypertension  - EKG 12-Lead - , RETROPERITNL ABD,  LTD - Urinalysis, Routine w reflex microscopic - Microalbumin / creatinine urine ratio - CBC with Differential/Platelet - COMPLETE METABOLIC PANEL WITH GFR - Magnesium - TSH  3. Hyperlipidemia, mixed  - EKG 12-Lead - [place], RETROPERITNL ABD,  LTD - Lipid panel - TSH  4. Abnormal glucose  - EKG 12-Lead -  [place], RETROPERITNL ABD,  LTD - Hemoglobin A1c - Insulin, random  5. Vitamin D deficiency  - VITAMIN D 25 Hydroxy   6. ADD (attention deficit disorder) without hyperactivity   7. Spastic hemiplegia of left nondominant side as late effect of  cerebrovascular disease, unspecified cerebrovascular disease type (HCC)   8. Chronic  migraine w/o aura w/o status migrainosus, not intractable   9. Postoperative hypothyroidism  - TSH  10. Screening for colorectal cancer  - POC Hemoccult Bld/Stl   11. Screening for ischemic heart disease  - EKG 12-Lead  12. FHx: heart disease  - EKG 12-Lead - [place], RETROPERITNL ABD,  LTD  13. Screening for AAA (aortic abdominal aneurysm)  - [place], RETROPERITNL ABD,  LTD  14. Fatigue, unspecified type  - Iron, Total/Total Iron Binding Cap - Vitamin B12 - CBC with Differential/Platelet - TSH  15. Medication management  - Urinalysis, Routine w reflex microscopic - Microalbumin / creatinine urine ratio - CBC with Differential/Platelet - COMPLETE METABOLIC PANEL WITH GFR - Magnesium - Lipid panel - TSH - Hemoglobin A1c - Insulin, random - VITAMIN D 25 Hydroxy   16. Screening-pulmonary TB  - TB Skin Test        Patient was counseled in prudent diet to achieve/maintain BMI less than 25 for weight control, BP monitoring, regular exercise and medications. Discussed med's effects and SE's. Screening labs and tests as requested with regular follow-up as recommended. Over 40 minutes of exam, counseling, chart review and high complex critical decision making was performed.   Marinus Maw, MD

## 2020-11-02 NOTE — Patient Instructions (Signed)
Due to recent changes in healthcare laws, you may see the results of your imaging and laboratory studies on MyChart before your provider has had a chance to review them.  We understand that in some cases there may be results that are confusing or concerning to you. Not all laboratory results come back in the same time frame and the provider may be waiting for multiple results in order to interpret others.  Please give [place] 48 hours in order for your provider to thoroughly review all the results before contacting the office for clarification of your results.   ++++++++++++++++++++++++++++++  Vit D  & Vit C 1,000 mg   are recommended to help protect  against the Covid-19 and other Corona viruses.    Also it's recommended  to take  Zinc 50 mg  to help  protect against the Covid-19   and best place to get  is also on [email or web address]  and don't pay more than 6-8 cents /pill !  ================================ Coronavirus (COVID-19) Are you at risk?  Are you at risk for the Coronavirus (COVID-19)?  To be considered HIGH RISK for Coronavirus (COVID-19), you have to meet the following criteria:  Traveled to [place], [place], [place], [place] or [place]; or in the [place] to [place], [place], [place]  or [place]; and have fever, cough, and shortness of breath within the last 2 weeks of travel OR Been in close contact with a person diagnosed with COVID-19 within the last 2 weeks and have  fever, cough,and shortness of breath  IF YOU DO NOT MEET THESE CRITERIA, YOU ARE CONSIDERED LOW RISK FOR COVID-19.  What to do if you are HIGH RISK for COVID-19?  If you are having a medical emergency, call 911. Seek medical care right away. Before you go to a doctor's office, urgent care or emergency department,  call ahead and tell them about your recent travel, contact with someone diagnosed with COVID-19   and your symptoms.  You should receive instructions from your physician's office regarding  next steps of care.  When you arrive at healthcare provider, tell the healthcare staff immediately you have returned from  visiting [place], [place], [place], [place] or [place]; or traveled in the [place] to [place], [place],  [place] or [place] in the last two weeks or you have been in close contact with a person diagnosed with  COVID-19 in the last 2 weeks.   Tell the health care staff about your symptoms: fever, cough and shortness of breath. After you have been seen by a medical provider, you will be either: Tested for (COVID-19) and discharged home on quarantine except to seek medical care if  symptoms worsen, and asked to  Stay home and avoid contact with others until you get your results (4-5 days)  Avoid travel on public transportation if possible (such as bus, train, or airplane) or Sent to the Emergency Department by EMS for evaluation, COVID-19 testing  and  possible admission depending on your condition and test results.  What to do if you are LOW RISK for COVID-19?  Reduce your risk of any infection by using the same precautions used for avoiding the common cold or flu:  Wash your hands often with soap and warm water for at least 20 seconds.  If soap and water are not readily available,  use an alcohol-based hand sanitizer with at least 60% alcohol.  If coughing or sneezing, cover your mouth and nose by coughing  or sneezing into the elbow areas of your shirt or coat,  into a tissue or into your sleeve (not your hands). Avoid shaking hands with others and consider head nods or verbal greetings only. Avoid touching your eyes, nose, or mouth with unwashed hands.  Avoid close contact with people who are sick. Avoid places or events with large numbers of people in one location, like concerts or sporting events. Carefully consider travel plans you have or are making. If you are planning any travel outside or inside the [place], visit the CDC's Travelers' Health webpage for  the latest health notices. If you have some symptoms but not all symptoms, continue to monitor at home and seek medical attention  if your symptoms worsen. If you are having a medical emergency, call 911. >>>>>>>>>>>>>>>>>>>>>>> Preventive Care for Adults  A healthy lifestyle and preventive care can promote health and wellness. Preventive health guidelines for women include the following key practices. A routine yearly physical is a good way to check with your health care provider about your health and preventive screening. It is a chance to share any concerns and updates on your health and to receive a thorough exam. Visit your dentist for a routine exam and preventive care every 6 months. Brush your teeth twice a day and floss once a day. Good oral hygiene prevents tooth decay and gum disease. The frequency of eye exams is based on your age, health, family medical history, use of contact lenses, and other factors. Follow your health care provider's recommendations for frequency of eye exams. Eat a healthy diet. Foods like vegetables, fruits, whole grains, low-fat dairy products, and lean protein foods contain the nutrients you need without too many calories. Decrease your intake of foods high in solid fats, added sugars, and salt. Eat the right amount of calories for you. Get information about a proper diet from your health care provider, if necessary. Regular physical exercise is one of the most important things you can do for your health. Most adults should get at least 150 minutes of moderate-intensity exercise (any activity that increases your heart rate and causes you to sweat) each week. In addition, most adults need muscle-strengthening exercises on 2 or more days a week. Maintain a healthy weight. The body mass index (BMI) is a screening tool to identify possible weight problems. It provides an estimate of body fat based on height and weight. Your health care provider can find your BMI and can  help you achieve or maintain a healthy weight. For adults 20 years and older: A BMI below 18.5 is considered underweight. A BMI of 18.5 to 24.9 is normal. A BMI of 25 to 29.9 is considered overweight. A BMI of 30 and above is considered obese. Maintain normal blood lipids and cholesterol levels by exercising and minimizing your intake of saturated fat. Eat a balanced diet with plenty of fruit and vegetables. Blood tests for lipids and cholesterol should begin at age 43 and be repeated every 5 years. If your lipid or cholesterol levels are high, you are over 50, or you are at high risk for heart disease, you may need your cholesterol levels checked more frequently. Ongoing high lipid and cholesterol levels should be treated with medicines if diet and exercise are not working. If you smoke, find out from your health care provider how to quit. If you do not use tobacco, do not start. Lung cancer screening is recommended for adults aged 11-80 years who are at high risk for developing  lung cancer because of a history of smoking. A yearly low-dose CT scan of the lungs is recommended for people who have at least a 30-pack-year history of smoking and are a current smoker or have quit within the past 15 years. A pack year of smoking is smoking an average of 1 pack of cigarettes a day for 1 year (for example: 1 pack a day for 30 years or 2 packs a day for 15 years). Yearly screening should continue until the smoker has stopped smoking for at least 15 years. Yearly screening should be stopped for people who develop a health problem that would prevent them from having lung cancer treatment. High blood pressure causes heart disease and increases the risk of stroke. Your blood pressure should be checked at least every 1 to 2 years. Ongoing high blood pressure should be treated with medicines if weight loss and exercise do not work. If you are 32-3 years old, ask your health care provider if you should take aspirin to  prevent strokes. Diabetes screening involves taking a blood sample to check your fasting blood sugar level. This should be done once every 3 years, after age 32, if you are within normal weight and without risk factors for diabetes. Testing should be considered at a younger age or be carried out more frequently if you are overweight and have at least 1 risk factor for diabetes. Breast cancer screening is essential preventive care for women. You should practice "breast self-awareness." This means understanding the normal appearance and feel of your breasts and may include breast self-examination. Any changes detected, no matter how small, should be reported to a health care provider. Women in their 45s and 30s should have a clinical breast exam (CBE) by a health care provider as part of a regular health exam every 1 to 3 years. After age 83, women should have a CBE every year. Starting at age 92, women should consider having a mammogram (breast X-ray test) every year. Women who have a family history of breast cancer should talk to their health care provider about genetic screening. Women at a high risk of breast cancer should talk to their health care providers about having an MRI and a mammogram every year. Breast cancer gene (BRCA)-related cancer risk assessment is recommended for women who have family members with BRCA-related cancers. BRCA-related cancers include breast, ovarian, tubal, and peritoneal cancers. Having family members with these cancers may be associated with an increased risk for harmful changes (mutations) in the breast cancer genes BRCA1 and BRCA2. Results of the assessment will determine the need for genetic counseling and BRCA1 and BRCA2 testing. Routine pelvic exams to screen for cancer are no longer recommended for nonpregnant women who are considered low risk for cancer of the pelvic organs (ovaries, uterus, and vagina) and who do not have symptoms. Ask your health care provider if a  screening pelvic exam is right for you. If you have had past treatment for cervical cancer or a condition that could lead to cancer, you need Pap tests and screening for cancer for at least 20 years after your treatment. If Pap tests have been discontinued, your risk factors (such as having a new sexual partner) need to be reassessed to determine if screening should be resumed. Some women have medical problems that increase the chance of getting cervical cancer. In these cases, your health care provider may recommend more frequent screening and Pap tests. Colorectal cancer can be detected and often prevented. Most routine colorectal  cancer screening begins at the age of 50 years and continues through age 75 years. However, your health care provider may recommend screening at an earlier age if you have risk factors for colon cancer. On a yearly basis, your health care provider may provide home test kits to check for hidden blood in the stool. Use of a small camera at the end of a tube, to directly examine the colon (sigmoidoscopy or colonoscopy), can detect the earliest forms of colorectal cancer. Talk to your health care provider about this at age 50, when routine screening begins.  Direct exam of the colon should be repeated every 5-10 years through age 75 years, unless early forms of pre-cancerous polyps or small growths are found. Hepatitis C blood testing is recommended for all people born from 1945 through 1965 and any individual with known risks for hepatitis C. Pra Osteoporosis is a disease in which the bones lose minerals and strength with aging. This can result in serious bone fractures or breaks. The risk of osteoporosis can be identified using a bone density scan. Women ages 65 years and over and women at risk for fractures or osteoporosis should discuss screening with their health care providers. Ask your health care provider whether you should take a calcium supplement or vitamin D to reduce the  rate of osteoporosis. Menopause can be associated with physical symptoms and risks. Hormone replacement therapy is available to decrease symptoms and risks. You should talk to your health care provider about whether hormone replacement therapy is right for you. Use sunscreen. Apply sunscreen liberally and repeatedly throughout the day. You should seek shade when your shadow is shorter than you. Protect yourself by wearing long sleeves, pants, a wide-brimmed hat, and sunglasses year round, whenever you are outdoors. Once a month, do a whole body skin exam, using a mirror to look at the skin on your back. Tell your health care provider of new moles, moles that have irregular borders, moles that are larger than a pencil eraser, or moles that have changed in shape or color. Stay current with required vaccines (immunizations). Influenza vaccine. All adults should be immunized every year. Tetanus, diphtheria, and acellular pertussis (Td, Tdap) vaccine. Pregnant women should receive 1 dose of Tdap vaccine during each pregnancy. The dose should be obtained regardless of the length of time since the last dose. Immunization is preferred during the 27th-36th week of gestation. An adult who has not previously received Tdap or who does not know her vaccine status should receive 1 dose of Tdap. This initial dose should be followed by tetanus and diphtheria toxoids (Td) booster doses every 10 years. Adults with an unknown or incomplete history of completing a 3-dose immunization series with Td-containing vaccines should begin or complete a primary immunization series including a Tdap dose. Adults should receive a Td booster every 10 years. Varicella vaccine. An adult without evidence of immunity to varicella should receive 2 doses or a second dose if she has previously received 1 dose. Pregnant females who do not have evidence of immunity should receive the first dose after pregnancy. This first dose should be obtained  before leaving the health care facility. The second dose should be obtained 4-8 weeks after the first dose. Human papillomavirus (HPV) vaccine. Females aged 13-26 years who have not received the vaccine previously should obtain the 3-dose series. The vaccine is not recommended for use in pregnant females. However, pregnancy testing is not needed before receiving a dose. If a female is found to   be pregnant after receiving a dose, no treatment is needed. In that case, the remaining doses should be delayed until after the pregnancy. Immunization is recommended for any person with an immunocompromised condition through the age of 26 years if she did not get any or all doses earlier. During the 3-dose series, the second dose should be obtained 4-8 weeks after the first dose. The third dose should be obtained 24 weeks after the first dose and 16 weeks after the second dose. Zoster vaccine. One dose is recommended for adults aged 60 years or older unless certain conditions are present. Measles, mumps, and rubella (MMR) vaccine. Adults born before 1957 generally are considered immune to measles and mumps. Adults born in 1957 or later should have 1 or more doses of MMR vaccine unless there is a contraindication to the vaccine or there is laboratory evidence of immunity to each of the three diseases. A routine second dose of MMR vaccine should be obtained at least 28 days after the first dose for students attending postsecondary schools, health care workers, or international travelers. People who received inactivated measles vaccine or an unknown type of measles vaccine during 1963-1967 should receive 2 doses of MMR vaccine. People who received inactivated mumps vaccine or an unknown type of mumps vaccine before 1979 and are at high risk for mumps infection should consider immunization with 2 doses of MMR vaccine. For females of childbearing age, rubella immunity should be determined. If there is no evidence of immunity,  females who are not pregnant should be vaccinated. If there is no evidence of immunity, females who are pregnant should delay immunization until after pregnancy. Unvaccinated health care workers born before 1957 who lack laboratory evidence of measles, mumps, or rubella immunity or laboratory confirmation of disease should consider measles and mumps immunization with 2 doses of MMR vaccine or rubella immunization with 1 dose of MMR vaccine. Pneumococcal 13-valent conjugate (PCV13) vaccine. When indicated, a person who is uncertain of her immunization history and has no record of immunization should receive the PCV13 vaccine. An adult aged 19 years or older who has certain medical conditions and has not been previously immunized should receive 1 dose of PCV13 vaccine. This PCV13 should be followed with a dose of pneumococcal polysaccharide (PPSV23) vaccine. The PPSV23 vaccine dose should be obtained at least 1 or more year(s) after the dose of PCV13 vaccine. An adult aged 19 years or older who has certain medical conditions and previously received 1 or more doses of PPSV23 vaccine should receive 1 dose of PCV13. The PCV13 vaccine dose should be obtained 1 or more years after the last PPSV23 vaccine dose.  Pneumococcal polysaccharide (PPSV23) vaccine. When PCV13 is also indicated, PCV13 should be obtained first. All adults aged 65 years and older should be immunized. An adult younger than age 65 years who has certain medical conditions should be immunized. Any person who resides in a nursing home or long-term care facility should be immunized. An adult smoker should be immunized. People with an immunocompromised condition and certain other conditions should receive both PCV13 and PPSV23 vaccines. People with human immunodeficiency virus (HIV) infection should be immunized as soon as possible after diagnosis. Immunization during chemotherapy or radiation therapy should be avoided. Routine use of PPSV23 vaccine is  not recommended for American Indians, [place] Natives, or people younger than 65 years unless there are medical conditions that require PPSV23 vaccine. When indicated, people who have unknown immunization and have no record of immunization should receive   PPSV23 vaccine. One-time revaccination 5 years after the first dose of PPSV23 is recommended for people aged 19-64 years who have chronic kidney failure, nephrotic syndrome, asplenia, or immunocompromised conditions. People who received 1-2 doses of PPSV23 before age 56 years should receive another dose of PPSV23 vaccine at age 89 years or later if at least 5 years have passed since the previous dose. Doses of PPSV23 are not needed for people immunized with PPSV23 at or after age 70 years.  Preventive Services / Frequency  Ages 96 to 55 years Blood pressure check. Lipid and cholesterol check. Lung cancer screening. / Every year if you are aged 80-80 years and have a 30-pack-year history of smoking and currently smoke or have quit within the past 15 years. Yearly screening is stopped once you have quit smoking for at least 15 years or develop a health problem that would prevent you from having lung cancer treatment. Clinical breast exam.** / Every year after age 23 years.  BRCA-related cancer risk assessment.** / For women who have family members with a BRCA-related cancer (breast, ovarian, tubal, or peritoneal cancers). Mammogram.** / Every year beginning at age 27 years and continuing for as long as you are in good health. Consult with your health care provider. Pap test.** / Every 3 years starting at age 89 years through age 28 or 28 years with a history of 3 consecutive normal Pap tests. HPV screening.** / Every 3 years from ages 52 years through ages 70 to 10 years with a history of 3 consecutive normal Pap tests. Fecal occult blood test (FOBT) of stool. / Every year beginning at age 67 years and continuing until age 43 years. You may not need to do  this test if you get a colonoscopy every 10 years. Flexible sigmoidoscopy or colonoscopy.** / Every 5 years for a flexible sigmoidoscopy or every 10 years for a colonoscopy beginning at age 47 years and continuing until age 39 years. Hepatitis C blood test.** / For all people born from [birth year] through 1965 and any individual with known risks for hepatitis C. Skin self-exam. / Monthly. Influenza vaccine. / Every year. Tetanus, diphtheria, and acellular pertussis (Tdap/Td) vaccine.** / Consult your health care provider. Pregnant women should receive 1 dose of Tdap vaccine during each pregnancy. 1 dose of Td every 10 years. Varicella vaccine.** / Consult your health care provider. Pregnant females who do not have evidence of immunity should receive the first dose after pregnancy. Zoster vaccine.** / 1 dose for adults aged 32 years or older. Pneumococcal 13-valent conjugate (PCV13) vaccine.** / Consult your health care provider. Pneumococcal polysaccharide (PPSV23) vaccine.** / 1 to 2 doses if you smoke cigarettes or if you have certain conditions. Meningococcal vaccine.** / Consult your health care provider. Hepatitis A vaccine.** / Consult your health care provider. Hepatitis B vaccine.** / Consult your health care provider. Screening for abdominal aortic aneurysm (AAA)  by ultrasound is recommended for people over 50 who have history of high blood pressure or who are current or former smokers. ++++++++++++++++++ Recommend Adult Low Dose Aspirin or  coated  Aspirin 81 mg daily  To reduce risk of Colon Cancer 40 %,  Skin Cancer 26 % ,  Melanoma 46%  and  Pancreatic cancer 60% +++++++++++++++++++ Vitamin D goal  is between 70-100.  Please make sure that you are taking your Vitamin D as directed.  It is very important as a natural anti-inflammatory  helping hair, skin, and nails, as well as reducing stroke and heart  attack risk.  It helps your bones and helps with mood. It also decreases  numerous cancer risks so please take it as directed.  Low Vit D is associated with a 200-300% higher risk for CANCER  and 200-300% higher risk for HEART   ATTACK  &  STROKE.   ...................................... It is also associated with higher death rate at younger ages,  autoimmune diseases like Rheumatoid arthritis, Lupus, Multiple Sclerosis.    Also many other serious conditions, like depression, Alzheimer's Dementia, infertility, muscle aches, fatigue, fibromyalgia - just to name a few. ++++++++++++++++++ Recommend the book "The END of DIETING" by Dr Joel Fuhrman  & the book "The END of DIABETES " by Dr Joel Fuhrman At [employer] - get book & Audio CD's    Being diabetic has a  300% increased risk for heart attack, stroke, cancer, and alzheimer- type vascular dementia. It is very important that you work harder with diet by avoiding all foods that are white. Avoid white rice (brown & wild rice is OK), white potatoes (sweetpotatoes in moderation is OK), White bread or wheat bread or anything made out of white flour like bagels, donuts, rolls, buns, biscuits, cakes, pastries, cookies, pizza crust, and pasta (made from white flour & egg whites) - vegetarian pasta or spinach or wheat pasta is OK. Multigrain breads like Arnold's or [employer], or multigrain sandwich thins or flatbreads.  Diet, exercise and weight loss can reverse and cure diabetes in the early stages.  Diet, exercise and weight loss is very important in the control and prevention of complications of diabetes which affects every system in your body, ie. Brain - dementia/stroke, eyes - glaucoma/blindness, heart - heart attack/heart failure, kidneys - dialysis, stomach - gastric paralysis, intestines - malabsorption, nerves - severe painful neuritis, circulation - gangrene & loss of a leg(s), and finally cancer and Alzheimers.    I recommend avoid fried & greasy foods,  sweets/candy, white rice (brown or wild rice or Quinoa is  OK), white potatoes (sweet potatoes are OK) - anything made from white flour - bagels, doughnuts, rolls, buns, biscuits,white and wheat breads, pizza crust and traditional pasta made of white flour & egg white(vegetarian pasta or spinach or wheat pasta is OK).  Multi-grain bread is OK - like multi-grain flat bread or sandwich thins. Avoid alcohol in excess. Exercise is also important.    Eat all the vegetables you want - avoid meat, especially red meat and dairy - especially cheese.  Cheese is the most concentrated form of trans-fats which is the worst thing to clog up our arteries. Veggie cheese is OK which can be found in the fresh produce section at [employer] or [employer] or [employer]  ++++++++++++++++++++++ DASH Eating Plan  DASH stands for "Dietary Approaches to Stop Hypertension."   The DASH eating plan is a healthy eating plan that has been shown to reduce high blood pressure (hypertension). Additional health benefits may include reducing the risk of type 2 diabetes mellitus, heart disease, and stroke. The DASH eating plan may also help with weight loss. WHAT DO I NEED TO KNOW ABOUT THE DASH EATING PLAN? For the DASH eating plan, you will follow these general guidelines: Choose foods with a percent daily value for sodium of less than 5% (as listed on the food label). Use salt-free seasonings or herbs instead of table salt or sea salt. Check with your health care provider or pharmacist before using salt substitutes. Eat lower-sodium products, often labeled as "lower sodium" or "no   salt added." Eat fresh foods. Eat more vegetables, fruits, and low-fat dairy products. Choose whole grains. Look for the word "whole" as the first word in the ingredient list. Choose fish  Limit sweets, desserts, sugars, and sugary drinks. Choose heart-healthy fats. Eat veggie cheese  Eat more home-cooked food and less restaurant, buffet, and fast food. Limit fried foods. Cook foods using methods other  than frying. Limit canned vegetables. If you do use them, rinse them well to decrease the sodium. When eating at a restaurant, ask that your food be prepared with less salt, or no salt if possible.                      WHAT FOODS CAN I EAT? Read Dr Joel Fuhrman's books on The End of Dieting & The End of Diabetes  Grains Whole grain or whole wheat bread. Brown rice. Whole grain or whole wheat pasta. Quinoa, bulgur, and whole grain cereals. Low-sodium cereals. Corn or whole wheat flour tortillas. Whole grain cornbread. Whole grain crackers. Low-sodium crackers.  Vegetables Fresh or frozen vegetables (raw, steamed, roasted, or grilled). Low-sodium or reduced-sodium tomato and vegetable juices. Low-sodium or reduced-sodium tomato sauce and paste. Low-sodium or reduced-sodium canned vegetables.   Fruits All fresh, canned (in natural juice), or frozen fruits.  Protein Products  All fish and seafood.  Dried beans, peas, or lentils. Unsalted nuts and seeds. Unsalted canned beans.  Dairy Low-fat dairy products, such as skim or 1% milk, 2% or reduced-fat cheeses, low-fat ricotta or cottage cheese, or plain low-fat yogurt. Low-sodium or reduced-sodium cheeses.  Fats and Oils Tub margarines without trans fats. Light or reduced-fat mayonnaise and salad dressings (reduced sodium). Avocado. Safflower, olive, or canola oils. Natural peanut or almond butter.  Other Unsalted popcorn and pretzels. The items listed above may not be a complete list of recommended foods or beverages. Contact your dietitian for more options.  ++++++++++++++++++  WHAT FOODS ARE NOT RECOMMENDED? Grains/ White flour or wheat flour White bread. White pasta. White rice. Refined cornbread. Bagels and croissants. Crackers that contain trans fat.  Vegetables  Creamed or fried vegetables. Vegetables in a . Regular canned vegetables. Regular canned tomato sauce and paste. Regular tomato and vegetable juices.  Fruits Dried  fruits. Canned fruit in light or heavy syrup. Fruit juice.  Meat and Other Protein Products Meat in general - RED meat & White meat.  Fatty cuts of meat. Ribs, chicken wings, all processed meats as bacon, sausage, bologna, salami, fatback, hot dogs, bratwurst and packaged luncheon meats.  Dairy Whole or 2% milk, cream, half-and-half, and cream cheese. Whole-fat or sweetened yogurt. Full-fat cheeses or blue cheese. Non-dairy creamers and whipped toppings. Processed cheese, cheese spreads, or cheese curds.  Condiments Onion and garlic salt, seasoned salt, table salt, and sea salt. Canned and packaged gravies. Worcestershire sauce. Tartar sauce. Barbecue sauce. Teriyaki sauce. Soy sauce, including reduced sodium. Steak sauce. Fish sauce. Oyster sauce. Cocktail sauce. Horseradish. Ketchup and mustard. Meat flavorings and tenderizers. Bouillon cubes. Hot sauce. Tabasco sauce. Marinades. Taco seasonings. Relishes.  Fats and Oils Butter, stick margarine, lard, shortening and bacon fat. Coconut, palm kernel, or palm oils. Regular salad dressings.  Pickles and olives. Salted popcorn and pretzels.  The items listed above may not be a complete list of foods and beverages to avoid.   

## 2020-11-03 ENCOUNTER — Other Ambulatory Visit: Payer: Self-pay

## 2020-11-03 ENCOUNTER — Ambulatory Visit (INDEPENDENT_AMBULATORY_CARE_PROVIDER_SITE_OTHER): Payer: BC Managed Care – PPO | Admitting: Internal Medicine

## 2020-11-03 ENCOUNTER — Other Ambulatory Visit: Payer: Self-pay | Admitting: Internal Medicine

## 2020-11-03 ENCOUNTER — Encounter: Payer: Self-pay | Admitting: Internal Medicine

## 2020-11-03 VITALS — BP 124/80 | HR 90 | Temp 97.5°F | Resp 16 | Ht 69.0 in | Wt 169.6 lb

## 2020-11-03 DIAGNOSIS — Z131 Encounter for screening for diabetes mellitus: Secondary | ICD-10-CM

## 2020-11-03 DIAGNOSIS — Z13220 Encounter for screening for lipoid disorders: Secondary | ICD-10-CM

## 2020-11-03 DIAGNOSIS — F988 Other specified behavioral and emotional disorders with onset usually occurring in childhood and adolescence: Secondary | ICD-10-CM

## 2020-11-03 DIAGNOSIS — Z0000 Encounter for general adult medical examination without abnormal findings: Secondary | ICD-10-CM

## 2020-11-03 DIAGNOSIS — Z1329 Encounter for screening for other suspected endocrine disorder: Secondary | ICD-10-CM

## 2020-11-03 DIAGNOSIS — Z79899 Other long term (current) drug therapy: Secondary | ICD-10-CM | POA: Diagnosis not present

## 2020-11-03 DIAGNOSIS — Z136 Encounter for screening for cardiovascular disorders: Secondary | ICD-10-CM | POA: Diagnosis not present

## 2020-11-03 DIAGNOSIS — Z1389 Encounter for screening for other disorder: Secondary | ICD-10-CM

## 2020-11-03 DIAGNOSIS — E559 Vitamin D deficiency, unspecified: Secondary | ICD-10-CM | POA: Diagnosis not present

## 2020-11-03 DIAGNOSIS — Z1211 Encounter for screening for malignant neoplasm of colon: Secondary | ICD-10-CM

## 2020-11-03 DIAGNOSIS — F419 Anxiety disorder, unspecified: Secondary | ICD-10-CM

## 2020-11-03 DIAGNOSIS — Z0001 Encounter for general adult medical examination with abnormal findings: Secondary | ICD-10-CM

## 2020-11-03 DIAGNOSIS — N1831 Chronic kidney disease, stage 3a: Secondary | ICD-10-CM | POA: Diagnosis not present

## 2020-11-03 DIAGNOSIS — Z130 Encounter for screening for diseases of the blood and blood-forming organs and certain disorders involving the immune mechanism: Secondary | ICD-10-CM | POA: Diagnosis not present

## 2020-11-03 DIAGNOSIS — R0989 Other specified symptoms and signs involving the circulatory and respiratory systems: Secondary | ICD-10-CM

## 2020-11-03 DIAGNOSIS — E890 Postprocedural hypothyroidism: Secondary | ICD-10-CM

## 2020-11-03 DIAGNOSIS — E782 Mixed hyperlipidemia: Secondary | ICD-10-CM

## 2020-11-03 DIAGNOSIS — Z8249 Family history of ischemic heart disease and other diseases of the circulatory system: Secondary | ICD-10-CM

## 2020-11-03 DIAGNOSIS — I69954 Hemiplegia and hemiparesis following unspecified cerebrovascular disease affecting left non-dominant side: Secondary | ICD-10-CM

## 2020-11-03 DIAGNOSIS — Z111 Encounter for screening for respiratory tuberculosis: Secondary | ICD-10-CM

## 2020-11-03 DIAGNOSIS — G43709 Chronic migraine without aura, not intractable, without status migrainosus: Secondary | ICD-10-CM

## 2020-11-03 DIAGNOSIS — R7309 Other abnormal glucose: Secondary | ICD-10-CM

## 2020-11-03 DIAGNOSIS — R5383 Other fatigue: Secondary | ICD-10-CM

## 2020-11-03 MED ORDER — ALPRAZOLAM 0.5 MG PO TABS: ORAL_TABLET | ORAL | 0 refills | Status: DC

## 2020-11-03 MED ORDER — AMPHETAMINE-DEXTROAMPHETAMINE 10 MG PO TABS: ORAL_TABLET | ORAL | 0 refills | Status: DC

## 2020-11-04 LAB — URINALYSIS, ROUTINE W REFLEX MICROSCOPIC
Bacteria, UA: NONE SEEN /HPF
Bilirubin Urine: NEGATIVE
Glucose, UA: NEGATIVE
Hgb urine dipstick: NEGATIVE
Hyaline Cast: NONE SEEN /LPF
Ketones, ur: NEGATIVE
Nitrite: NEGATIVE
Protein, ur: NEGATIVE
RBC / HPF: NONE SEEN /HPF (ref 0–2)
Specific Gravity, Urine: 1.011 (ref 1.001–1.035)
WBC, UA: NONE SEEN /HPF (ref 0–5)
pH: 7 (ref 5.0–8.0)

## 2020-11-04 LAB — MAGNESIUM: Magnesium: 2.2 mg/dL (ref 1.5–2.5)

## 2020-11-04 LAB — COMPLETE METABOLIC PANEL WITH GFR
AG Ratio: 1.8 (calc) (ref 1.0–2.5)
ALT: 28 U/L (ref 6–29)
AST: 23 U/L (ref 10–35)
Albumin: 4.4 g/dL (ref 3.6–5.1)
Alkaline phosphatase (APISO): 72 U/L (ref 37–153)
BUN: 19 mg/dL (ref 7–25)
CO2: 27 mmol/L (ref 20–32)
Calcium: 9.7 mg/dL (ref 8.6–10.4)
Chloride: 106 mmol/L (ref 98–110)
Creat: 0.88 mg/dL (ref 0.50–1.05)
Globulin: 2.5 g/dL (calc) (ref 1.9–3.7)
Glucose, Bld: 90 mg/dL (ref 65–99)
Potassium: 4.2 mmol/L (ref 3.5–5.3)
Sodium: 140 mmol/L (ref 135–146)
Total Bilirubin: 0.4 mg/dL (ref 0.2–1.2)
Total Protein: 6.9 g/dL (ref 6.1–8.1)
eGFR: 74 mL/min/{1.73_m2} (ref >=60)

## 2020-11-04 LAB — CBC WITH DIFFERENTIAL/PLATELET
Absolute Monocytes: 312 cells/uL (ref 200–950)
Basophils Absolute: 32 cells/uL (ref 0–200)
Basophils Relative: 0.8 %
Eosinophils Absolute: 120 cells/uL (ref 15–500)
Eosinophils Relative: 3 %
HCT: 43.9 % (ref 35.0–45.0)
Hemoglobin: 14.9 g/dL (ref 11.7–15.5)
Lymphs Abs: 1552 cells/uL (ref 850–3900)
MCH: 31.4 pg (ref 27.0–33.0)
MCHC: 33.9 g/dL (ref 32.0–36.0)
MCV: 92.6 fL (ref 80.0–100.0)
MPV: 10.6 fL (ref 7.5–12.5)
Monocytes Relative: 7.8 %
Neutro Abs: 1984 cells/uL (ref 1500–7800)
Neutrophils Relative %: 49.6 %
Platelets: 199 10*3/uL (ref 140–400)
RBC: 4.74 10*6/uL (ref 3.80–5.10)
RDW: 12.1 % (ref 11.0–15.0)
Total Lymphocyte: 38.8 %
WBC: 4 10*3/uL (ref 3.8–10.8)

## 2020-11-04 LAB — LIPID PANEL
Cholesterol: 177 mg/dL (ref <200)
HDL: 62 mg/dL (ref >=50)
LDL Cholesterol (Calc): 90 mg/dL (calc)
Non-HDL Cholesterol (Calc): 115 mg/dL (calc) (ref <130)
Total CHOL/HDL Ratio: 2.9 (calc) (ref <5.0)
Triglycerides: 157 mg/dL — ABNORMAL HIGH (ref <150)

## 2020-11-04 LAB — IRON, TOTAL/TOTAL IRON BINDING CAP
%SAT: 33 % (calc) (ref 16–45)
Iron: 119 ug/dL (ref 45–160)
TIBC: 361 mcg/dL (calc) (ref 250–450)

## 2020-11-04 LAB — VITAMIN B12: Vitamin B-12: 688 pg/mL (ref 200–1100)

## 2020-11-04 LAB — TSH: TSH: 1.4 mIU/L (ref 0.40–4.50)

## 2020-11-04 LAB — PTH, INTACT AND CALCIUM
Calcium: 9.7 mg/dL (ref 8.6–10.4)
PTH: 25 pg/mL (ref 16–77)

## 2020-11-04 LAB — VITAMIN D 25 HYDROXY (VIT D DEFICIENCY, FRACTURES): Vit D, 25-Hydroxy: 65 ng/mL (ref 30–100)

## 2020-11-04 LAB — MICROALBUMIN / CREATININE URINE RATIO
Creatinine, Urine: 48 mg/dL (ref 20–275)
Microalb, Ur: <0.2 mg/dL

## 2020-11-04 LAB — MICROSCOPIC MESSAGE

## 2020-11-04 LAB — HEMOGLOBIN A1C
Hgb A1c MFr Bld: 5.5 % of total Hgb (ref <5.7)
Mean Plasma Glucose: 111 mg/dL
eAG (mmol/L): 6.2 mmol/L

## 2020-11-04 LAB — INSULIN, RANDOM: Insulin: 37.4 u[IU]/mL — ABNORMAL HIGH

## 2020-11-04 NOTE — Progress Notes (Signed)
============================================================ -   Test results slightly outside the reference range are not unusual. If there is anything important, I will review this with you,  otherwise it is considered normal test values.  If you have further questions,  please do not hesitate to contact me at the office or via My Chart.  ============================================================ ============================================================  -  PTH hormone that regulate calcium balance is Normal & OK  ============================================================ ============================================================  - Iron  & Vitamin B12 levels are both Normal / OK  ============================================================ ============================================================  - Total Chol = 177    & LDL Chol 90 - Both  Excellent   - Very low risk for Heart Attack  / Stroke ============================================================ ============================================================  -  A1c = 5.5% - Hurrah - Finally back down in the Normal Non Diabetic Range ! ============================================================ ============================================================  - Vitamin D = 65 - Excellent  ============================================================ ============================================================  - All Else - CBC - Kidneys - Electrolytes - Liver - Magnesium & Thyroid    - all  Normal / OK ===========================================================

## 2020-11-05 ENCOUNTER — Other Ambulatory Visit: Payer: Self-pay

## 2020-11-05 ENCOUNTER — Ambulatory Visit (INDEPENDENT_AMBULATORY_CARE_PROVIDER_SITE_OTHER): Payer: BC Managed Care – PPO | Admitting: Sports Medicine

## 2020-11-05 ENCOUNTER — Encounter: Payer: Self-pay | Admitting: Sports Medicine

## 2020-11-05 VITALS — BP 124/82 | Ht 69.0 in | Wt 165.0 lb

## 2020-11-05 DIAGNOSIS — R29898 Other symptoms and signs involving the musculoskeletal system: Secondary | ICD-10-CM | POA: Diagnosis not present

## 2020-11-05 DIAGNOSIS — Z8673 Personal history of transient ischemic attack (TIA), and cerebral infarction without residual deficits: Secondary | ICD-10-CM

## 2020-11-05 DIAGNOSIS — I69954 Hemiplegia and hemiparesis following unspecified cerebrovascular disease affecting left non-dominant side: Secondary | ICD-10-CM | POA: Diagnosis not present

## 2020-11-05 DIAGNOSIS — M21272 Flexion deformity, left ankle and toes: Secondary | ICD-10-CM | POA: Diagnosis not present

## 2020-11-05 NOTE — Progress Notes (Signed)
PCP: Lucky CowboyMcKeown, William, MD  Subjective:   HPI: Patient is a 60 y.o. female here for evaluation of left ankle inversion and weakness s/p stroke in 2013.  Patient reports she had a large stroke from a carotid artery infarction back in 2013 and had near complete hemiparesis of her left side. She underwent intensive inpatient and outpatient rehab and has regained quite a bit of function of both her LUE and LLE.  She reports today with the concern that her left ankle will invert when she is walking. It is to the point that she feels unstable with her gait and has had two incidents in the past few months where she has tripped because of this. She does receive botox injections for spasticity of LUE and LLE every 3 months for her muscle contractures. Her ankle inversion will be better following these injections, but as the injection wears off she has more issues with the left leg/foot. She is very active and has done formal PT years ago and is active with exercises in the gym to help with her core and gait. She denies any pain of the leg, but has residual weakness and neuropathy.   She also reports a bunion on the right 1st great toe. This has been present for a few years, only bothers her sometimes. She had seen Dr. Prince RomeHilts for this previously and was given a toe pad and got OTC gel toe spreader which do help to an extent. When she wears tight toe-box shoes she gets rubbing over the 1st MTP. No warmth or swelling reported by her. The pain is only minimally bothersome at times. No significant pain today. No prior injury. Reports her parents had bunions as well.  Past Medical History:  Diagnosis Date   Anemia    Anxiety    Asthma    Chronic heartburn    Depression    Elevated cholesterol    on Crestor   Headache(784.0)    Hypertension    Hypothyroidism    S/P thyroidectomy   Migraine    Occlusion and stenosis of carotid artery with cerebral infarction    Spasm of muscle    Unspecified cerebral artery  occlusion with cerebral infarction    Vitamin D deficiency     Current Outpatient Medications on File Prior to Visit  Medication Sig Dispense Refill   albuterol (PROVENTIL HFA) 108 (90 Base) MCG/ACT inhaler 1 to 2 inhalations 10-15 minutes apart every 4 hours if needed for asthma rescue 48 g 3   ALPRAZolam (XANAX) 0.5 MG tablet Take 1/2 - 1 tablet at Bedtime  ONLY if needed forSleep &  limit to 5 days /week to avoid Addiction & Dementia (Dx: g47.0) 30 tablet 0   amphetamine-dextroamphetamine (ADDERALL) 10 MG tablet Take  1/2 - 1 tablet  1 or 2 x /day  for ADD 60 tablet 0   aspirin EC 81 MG tablet Take 81 mg by mouth daily.     augmented betamethasone dipropionate (DIPROLENE-AF) 0.05 % cream Apply topically 2 (two) times daily as needed.     botulinum toxin Type A (BOTOX) 100 units SOLR injection Inject 300 Units into the muscle every 3 (three) months.     BREO ELLIPTA 100-25 MCG/INH AEPB USE 1 INHALATION DAILY TO PREVENT ASTHMA 180 each 3   buPROPion (WELLBUTRIN XL) 150 MG 24 hr tablet TAKE 1 TABLET EVERY MORNING FOR MOOD. FOCUS & CONCENTRATION 90 tablet 3   butalbital-acetaminophen-caffeine (FIORICET) 50-325-40 MG tablet TAKE 1 TABLET BY MOUTH DAILY  AS NEEDED FOR HEADACHE. DO NOT REFILL IN LESS THAN 30 DAYS, 12 tablet 4   cetirizine (ZYRTEC) 10 MG tablet Take 10 mg by mouth daily.     Estradiol 10 MCG TABS vaginal tablet Place 10 mcg vaginally 2 (two) times a week.     fenofibrate micronized (LOFIBRA) 134 MG capsule TAKE 1 CAPSULE DAILY FOR TRIGLYCERIDES (BLOOD FATS) 90 capsule 3   gabapentin (NEURONTIN) 100 MG capsule Take  1 to 3 capsules  1 hour  prior to Bedtime as needed for  Sleep & Hot Flashes 270 capsule 3   levothyroxine (SYNTHROID) 88 MCG tablet TAKE 1 TABLET DAILY ON AN EMPTY STOMACH WITH ONLY WATER FOR 30 MINUTES & NO ANTACID MEDS, CALCIUM OR MAGNESIUM FOR 4 HOURS & AVOID BIOTIN 90 tablet 3   magnesium oxide (MAG-OX) 400 MG tablet Take 400 mg by mouth 2 (two) times daily.       montelukast (SINGULAIR) 10 MG tablet TAKE 1 TABLET BY MOUTH AT BEDTIME FOR ALLERGIES 90 tablet 1   Multiple Vitamin (MULTIVITAMIN WITH MINERALS) TABS tablet Take 1 tablet by mouth daily.     Multiple Vitamins-Minerals (ZINC PO) Take 1 tablet by mouth daily.     nortriptyline (PAMELOR) 25 MG capsule Take 1 capsule (25 mg total) by mouth at bedtime. 90 capsule 3   omeprazole (PRILOSEC) 40 MG capsule TAKE 1 CAPSULE DAILY FOR ACID INDIGESTION & REFLUX 90 capsule 3   ondansetron (ZOFRAN ODT) 8 MG disintegrating tablet Dissolve 1 tablet under tongue every 6 to 8 hours for nausea  or vomitting 30 tablet 0   Riboflavin 100 MG TABS Take 100 mg by mouth. bid     rosuvastatin (CRESTOR) 40 MG tablet Take  1 tablet  Daily  for Cholesterol 90 tablet 3   Ubrogepant (UBRELVY) 100 MG TABS Take 100 mg by mouth as needed. 12 tablet 11   verapamil (CALAN-SR) 240 MG CR tablet TAKE 1 TABLET DAILY WITH FOOD FOR BP & MIGRAINE PREVENTION 90 tablet 3   vitamin B-12 (CYANOCOBALAMIN) 1000 MCG tablet Take 1,000 mcg by mouth daily.     Vitamin D, Ergocalciferol, (DRISDOL) 1.25 MG (50000 UT) CAPS capsule Take 1 capsule daily for Vitamin D deficiency. (Patient taking differently: Take 50,000 Units by mouth every 7 (seven) days.) 90 capsule 1   No current facility-administered medications on file prior to visit.    Past Surgical History:  Procedure Laterality Date   AUGMENTATION MAMMAPLASTY Bilateral 2000   COSMETIC SURGERY     CYSTOCELE REPAIR N/A 01/08/2013   Procedure: ANTERIOR REPAIR (CYSTOCELE);  Surgeon: Meriel Pica, MD;  Location: [facility];  Service: Gynecology;  Laterality: N/A;   NASAL SINUS SURGERY     VAGINAL HYSTERECTOMY      Allergies  Allergen Reactions   Penicillins Anaphylaxis and Hives   Imitrex [Sumatriptan]    Other Swelling    kiwi   Sulfa Antibiotics Hives   Zomig [Zolmitriptan]     Social History   Socioeconomic History   Marital status: Married    Spouse name: Brett Canales   Number of  children: 2   Years of education: college   Highest education level: Not on file  Occupational History   Occupation: DESIGNER    Employer: S[employer]  Tobacco Use   Smoking status: Never   Smokeless tobacco: Never  Substance and Sexual Activity   Alcohol use: Yes    Comment: occassionally drinks a glass of wine   Drug use: No   Sexual  activity: Not on file  Other Topics Concern   Not on file  Social History Narrative   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 Patient is married and lives with her husband. Patient has college education.   Right handed.   Caffeine- two cups daily.   Social Determinants of Health   Financial Resource Strain: Not on file  Food Insecurity: Not on file  Transportation Needs: Not on file  Physical Activity: Not on file  Stress: Not on file  Social Connections: Not on file  Intimate Partner Violence: Not on file    Family History  Problem Relation Age of Onset   Heart disease Mother    Peptic Ulcer Disease Mother    Heart disease Father    Parkinson's disease Father     BP 124/82   Ht 5\' 9"  (1.753 m)   Wt 165 lb (74.8 kg)   BMI 24.37 kg/m   No flowsheet data found.  No flowsheet data found.  Review of Systems: See HPI above.     Objective:  Physical Exam:  Gen: Well-appearing, in no acute distress; non-toxic CV: Regular Rate. Well-perfused. Warm.  Resp: Breathing unlabored on room air; no wheezing. Psych: Fluid speech in conversation; appropriate affect; normal thought process Neuro: Sensation intact throughout. No gross coordination deficits.  MSK:   - Hips: No significant tenderness to palpation over either greater trochanteric region.  There is reduced strength 3-4/5 of left hip abduction compared to the contralateral hip.   - Left ankle: No visible erythema, swelling, ecchymosis or bony deformity.  No significant tenderness to palpation.  No notable pes planus/cavus deformity.  The patient does have some loss of  the transverse arch when standing. Strength is 4/5 with ankle dorsiflexion, eversion/inversion.  There is 3+/5 strength with ankle plantar flexion.  There is no tenderness to palpation over the metatarsals.  Negative anterior drawer and talar tilt.  - Right foot: Mild bunion deformity of the great toe of the right foot.  There is mild prominence of the bony aspect of the medial first MTP joint.  No overlying erythema, warmth or swelling.  Mild pes planus with slight loss of transverse arch.  - Gait analysis: There is significant inversion of the left foot throughout the Stance phase with exaggerated supination in addition. There is a moderate amount of Trendelenburg throughout her gait cycle as well. I did not appreciate a significant degree of foot drop during analysis today.   Assessment & Plan:  1. Left ankle weakness, inversion predominance 2. Hip abductor muscle weakness 3. Bunion, right 1st toe 4. Spastic hemiplegia of left side, 2/2 history of CVA (2013)  There is significant left ankle inversion with additional supination throughout the patient's gait cycle.  This is most likely a residual effect from her prior CVA with L-hemiparesis, But her weakness in hip abduction on the left is certainly contributing as the patient does walk with a moderate Trendelenburg gait. We placed the patient in green athletic insoles for her shoes, with the left insole fitted with a three-quarter length lateral foot wedge in attempt to reduce supination. With the insoles, there was an approximate 30% reduction in ankle inversion and supination throughout her gait, although still noticeable inversion.  The patient was provided with hip abduction strengthening exercises to see if this can help improve her posture and pelvic support through her gait cycle.  We will give her about 1 month to participate in her home exercises to see if this  is somewhat correctable through strengthening or if this is a permanent deficiency  secondary to her prior CVA with hemiplegia. If this is still giving her issues, we can consider placing a more prominent lateral wedge to see if this corrects her gait, or the next option would be considering an AFO brace for support, especially if foot drop is more of an apparent issue at follow-up.  In terms of her bunion, she can continue using the bunion pad and gelatin toe spreader to help with symptomatic relief. If this still is an ongoing issue, we can consider a greater transverse arch support with the insole for the right foot. Patient will follow up in about 5-6 weeks for reevaluation, she may call sooner if any issues arise.  Madelyn Brunner, DO  PGY-4, Sports Medicine Fellow [facility]  Addendum:  Patient seen in the office by fellow.  His history, exam, plan of care were precepted with me.  Norton Blizzard MD Marrianne Mood

## 2020-11-12 ENCOUNTER — Other Ambulatory Visit: Payer: Self-pay | Admitting: Internal Medicine

## 2020-11-16 ENCOUNTER — Other Ambulatory Visit: Payer: Self-pay | Admitting: Neurology

## 2020-12-02 NOTE — Progress Notes (Signed)
Assessment and Plan:  Ruth Gregory was seen today for acute visit.  Diagnoses and all orders for this visit:  Left-sided nosebleed   Active bleeding site cauterized with silver nitrate in left nare  Do not touch the nose today Starting tomorrow use saline nasal spray twice a day Afrin nasal spray three times a day for 3 days To lean forward and apply ice and pinch top of nose if recurs.  If unable to get to stop bleeding go to ER   Further disposition pending results of labs. Discussed med's effects and SE's.   Over 30 minutes of exam, counseling, chart review, and critical decision making was performed.   Future Appointments  Date Time Provider Department Center  12/17/2020 11:00 AM Ruth Brunner, DO [facility] Ruth [facility]  01/21/2021  2:30 PM Ruth Feinstein, MD [facility] None  05/07/2021 11:00 AM Ruth Humphrey, NP [facility] None  11/03/2021 11:00 AM Ruth Cowboy, MD [facility] None    ------------------------------------------------------------------------------------------------------------------   HPI BP 140/90   Pulse 98   Temp 97.9 F (36.6 C)   Wt 167 lb 9.6 oz (76 kg)   SpO2 97%   BMI 24.75 kg/m  60 y.o.female presents for nose bleeds 3 days in a row, intermittent. Bleeding starts spontaneously.  Has occ clots, can feel blood running down the back of her throat.  1985 had deviated  septum repair and has felt nasal membranes are thinner since that time.  2009 Dr. Pollyann Gregory removed a polyp from left nare.  Has appointment with Dr. Pollyann Gregory in October.  Past Medical History:  Diagnosis Date   Anemia    Anxiety    Asthma    Chronic heartburn    Depression    Elevated cholesterol    on Crestor   Headache(784.0)    Hypertension    Hypothyroidism    S/P thyroidectomy   Migraine    Occlusion and stenosis of carotid artery with cerebral infarction    Spasm of muscle    Unspecified cerebral artery occlusion with cerebral infarction    Vitamin D deficiency      Allergies  Allergen Reactions    Penicillins Anaphylaxis and Hives   Imitrex [Sumatriptan]    Other Swelling    kiwi   Sulfa Antibiotics Hives   Zomig [Zolmitriptan]     Current Outpatient Medications on File Prior to Visit  Medication Sig   albuterol (PROVENTIL HFA) 108 (90 Base) MCG/ACT inhaler 1 to 2 inhalations 10-15 minutes apart every 4 hours if needed for asthma rescue   ALPRAZolam (XANAX) 0.5 MG tablet Take 1/2 - 1 tablet at Bedtime  ONLY if needed forSleep &  limit to 5 days /week to avoid Addiction & Dementia (Dx: g47.0) (Patient taking differently: Take 1/2 - 1 tablet at Bedtime  ONLY if needed forSleep &  limit to 5 days /week to avoid Addiction & Dementia (Dx: g47.0))   amphetamine-dextroamphetamine (ADDERALL) 10 MG tablet Take  1/2 - 1 tablet  1 or 2 x /day  for ADD (Patient taking differently: Take  1/2 - 1 tablet  1 or 2 x /day  for ADD  1 tab Monday-Friday)   aspirin EC 81 MG tablet Take 81 mg by mouth daily.   botulinum toxin Type A (BOTOX) 100 units SOLR injection Inject 300 Units into the muscle every 3 (three) months.   BREO ELLIPTA 100-25 MCG/INH AEPB USE 1 INHALATION DAILY TO PREVENT ASTHMA   buPROPion (WELLBUTRIN XL) 150 MG 24 hr tablet TAKE 1 TABLET EVERY  MORNING FOR MOOD. FOCUS & CONCENTRATION   butalbital-acetaminophen-caffeine (FIORICET) 50-325-40 MG tablet TAKE 1 TABLET BY MOUTH DAILY AS NEEDED FOR HEADACHE. DO NOT REFILL IN LESS THAN 30 DAYS,   cetirizine (ZYRTEC) 10 MG tablet Take 10 mg by mouth daily.   Estradiol 10 MCG TABS vaginal tablet Place 10 mcg vaginally 2 (two) times a week.   fenofibrate micronized (LOFIBRA) 134 MG capsule TAKE 1 CAPSULE DAILY FOR TRIGLYCERIDES (BLOOD FATS)   levothyroxine (SYNTHROID) 88 MCG tablet TAKE 1 TABLET DAILY ON AN EMPTY STOMACH WITH ONLY WATER FOR 30 MINUTES & NO ANTACID MEDS, CALCIUM OR MAGNESIUM FOR 4 HOURS & AVOID BIOTIN   magnesium oxide (MAG-OX) 400 MG tablet Take 400 mg by mouth 2 (two) times daily.    montelukast (SINGULAIR) 10 MG tablet TAKE 1  TABLET BY MOUTH AT BEDTIME FOR ALLERGIES   nortriptyline (PAMELOR) 25 MG capsule TAKE 1 CAPSULE BY MOUTH AT BEDTIME.   omeprazole (PRILOSEC) 40 MG capsule Take  1 capsule  Daily  to Prevent Heartburn & Indigestion / Patient knows to take by mouth   ondansetron (ZOFRAN ODT) 8 MG disintegrating tablet Dissolve 1 tablet under tongue every 6 to 8 hours for nausea  or vomitting   Riboflavin 100 MG TABS Take 100 mg by mouth. bid   rosuvastatin (CRESTOR) 40 MG tablet Take  1 tablet  Daily  for Cholesterol   Ubrogepant (UBRELVY) 100 MG TABS Take 100 mg by mouth as needed.   verapamil (CALAN-SR) 240 MG CR tablet TAKE 1 TABLET DAILY WITH FOOD FOR BP & MIGRAINE PREVENTION   vitamin B-12 (CYANOCOBALAMIN) 1000 MCG tablet Take 1,000 mcg by mouth daily.   Vitamin D, Ergocalciferol, (DRISDOL) 1.25 MG (50000 UT) CAPS capsule Take 1 capsule daily for Vitamin D deficiency. (Patient taking differently: Take 50,000 Units by mouth every 7 (seven) days.)   zinc gluconate 50 MG tablet Take 50 mg by mouth daily.   No current facility-administered medications on file prior to visit.    ROS: all negative except above.   Physical Exam:  BP 140/90   Pulse 98   Temp 97.9 F (36.6 C)   Wt 167 lb 9.6 oz (76 kg)   SpO2 97%   BMI 24.75 kg/m   General Appearance: Well nourished, in no apparent distress. Eyes: PERRLA, EOMs, conjunctiva no swelling or erythema Sinuses: No Frontal/maxillary tenderness ENT/Mouth: Ext aud canals clear, TMs without erythema, bulging. No erythema, swelling, or exudate on post pharynx.  Tonsils not swollen or erythematous. Hearing normal.  Upon otoscope examination: Right nare slight erythema no bleeding, left nare bleeding noted from septum, cauterized with silver nitrate.  Bleeding had resolved at time pt left.  Neck: Supple, thyroid normal.  Respiratory: Respiratory effort normal, BS equal bilaterally without rales, rhonchi, wheezing or stridor.  Cardio: RRR with no MRGs. Brisk  peripheral pulses without edema.  Abdomen: Soft, + BS.  Non tender, no guarding, rebound, hernias, masses. Lymphatics: Non tender without lymphadenopathy.  Musculoskeletal: Full ROM, 5/5 strength, normal gait.  Skin: Warm, dry without rashes, lesions, ecchymosis.  Neuro: Cranial nerves intact. Normal muscle tone, no cerebellar symptoms. Sensation intact.  Psych: Awake and oriented X 3, normal affect, Insight and Judgment appropriate.    Ruth Humphrey, NP 12:15 PM [facility]

## 2020-12-03 ENCOUNTER — Other Ambulatory Visit: Payer: Self-pay

## 2020-12-03 ENCOUNTER — Ambulatory Visit (INDEPENDENT_AMBULATORY_CARE_PROVIDER_SITE_OTHER): Payer: BC Managed Care – PPO | Admitting: Nurse Practitioner

## 2020-12-03 ENCOUNTER — Encounter: Payer: Self-pay | Admitting: Nurse Practitioner

## 2020-12-03 VITALS — BP 140/90 | HR 98 | Temp 97.9°F | Wt 167.6 lb

## 2020-12-03 DIAGNOSIS — R040 Epistaxis: Secondary | ICD-10-CM | POA: Diagnosis not present

## 2020-12-03 NOTE — Patient Instructions (Signed)
Do not touch the nose today Starting tomorrow use saline nasal spray twice a day Afrin nasal spray three times a day for 3 days  Nosebleed, Adult A nosebleed is when blood comes out of the nose. Nosebleeds are common and can be caused by many things. They are usually not a sign of a serious medical problem. Follow these instructions at home: When you have a nosebleed:  Sit down. Tilt your head a little forward. Follow these steps: Pinch your nose with a clean towel or tissue. Keep pinching your nose for 5 minutes. Do not let go. After 5 minutes, let go of your nose. If there is still bleeding, do these steps again. Keep doing these steps until the bleeding stops. Do not put tissues or other things in your nose to stop the bleeding. Avoid lying down or putting your head back. Use a nose spray decongestant as told by your doctor. After a nosebleed: Try not to blow your nose or sniffle for several hours. Try not to strain, lift, or bend at the waist for several days. Aspirin and blood-thinning medicines make bleeding more likely. If you take these medicines: Ask your doctor if you should stop taking them or if you should change how much you take. Do not stop taking the medicine unless your doctor tells you to. If your nosebleed was caused by dryness, use over-the-counter saline nasal spray or gel and humidifier as told by your doctor. This will keep the inside of your nose moist and allow it to heal. If you need to use one of these products: Choose one that is water-soluble. Use only as much as you need and use it only as often as needed. Do not lie down right away after you use it. If you get nosebleeds often, talk with your doctor about treatments. These may include: Nasal cautery. A chemical swab or electrical device is used to lightly burn tiny blood vessels inside the nose. This helps stop or prevent nosebleeds. Nasal packing. A gauze or other material is placed in the nose to keep  constant pressure on the bleeding area. Contact a doctor if: You have a fever. You get nosebleeds often. You are getting nosebleeds more often than usual. You bruise very easily. You have something stuck in your nose. You have bleeding in your mouth. You vomit or cough up brown material. You get a nosebleed after you start a new medicine. Get help right away if: You have a nosebleed after you fall or hurt your head. Your nosebleed does not go away after 20 minutes. You feel dizzy or weak. You have unusual bleeding from other parts of your body. You have unusual bruising on other parts of your body. You get sweaty. You vomit blood. Summary Nosebleeds are common. They are usually not a sign of a serious medical problem. When you have a nosebleed, sit down and tilt your head a little forward. Pinch your nose with a clean tissue for 5 minutes. Use saline spray or saline gel and a humidifier as told by your doctor. Get help right away if your nosebleed does not go away after 20 minutes. This information is not intended to replace advice given to you by your health care provider. Make sure you discuss any questions you have with your health care provider. Document Revised: 01/11/2019 Document Reviewed: 01/11/2019 Elsevier Patient Education  2022 [employer].

## 2020-12-04 DIAGNOSIS — H5203 Hypermetropia, bilateral: Secondary | ICD-10-CM | POA: Diagnosis not present

## 2020-12-04 DIAGNOSIS — H18513 Endothelial corneal dystrophy, bilateral: Secondary | ICD-10-CM | POA: Diagnosis not present

## 2020-12-15 ENCOUNTER — Telehealth: Payer: Self-pay | Admitting: Neurology

## 2020-12-15 NOTE — Telephone Encounter (Signed)
I returned the call to the patient. She would like to schedule a regular office visit to discuss her overall health - hx of CVA and migraine management.

## 2020-12-15 NOTE — Telephone Encounter (Signed)
Pt called wanting to discuss some concerns she is having about her health with the RN or Provider. She  would like to discuss possible stroke prevention. Please advise.

## 2020-12-16 ENCOUNTER — Other Ambulatory Visit: Payer: Self-pay | Admitting: Internal Medicine

## 2020-12-16 DIAGNOSIS — Z1231 Encounter for screening mammogram for malignant neoplasm of breast: Secondary | ICD-10-CM

## 2020-12-17 ENCOUNTER — Ambulatory Visit: Payer: BC Managed Care – PPO | Admitting: Sports Medicine

## 2020-12-24 ENCOUNTER — Ambulatory Visit: Payer: BC Managed Care – PPO | Admitting: Family Medicine

## 2020-12-31 ENCOUNTER — Ambulatory Visit (INDEPENDENT_AMBULATORY_CARE_PROVIDER_SITE_OTHER): Payer: BC Managed Care – PPO | Admitting: Family Medicine

## 2020-12-31 VITALS — Ht 69.0 in | Wt 165.0 lb

## 2020-12-31 DIAGNOSIS — R269 Unspecified abnormalities of gait and mobility: Secondary | ICD-10-CM

## 2020-12-31 NOTE — Progress Notes (Signed)
PCP: Lucky Cowboy, MD  Subjective:   HPI: Patient is a 60 y.o. female here for custom orthotics.  Patient returns today due to her notable supination of left foot/ankle related to spasticity from her carotid artery infarction in 2013.   She gets botox injections left upper and lower extremity every 3 months -- about due for these again.  States done on medial lower leg and sometimes in arch. A couple times she has scuffed outside of her left foot and tripped as a result. Has known bunion on right that hurts with smaller shoes.  Past Medical History:  Diagnosis Date   Anemia    Anxiety    Asthma    Chronic heartburn    Depression    Elevated cholesterol    on Crestor   Headache(784.0)    Hypertension    Hypothyroidism    S/P thyroidectomy   Migraine    Occlusion and stenosis of carotid artery with cerebral infarction    Spasm of muscle    Unspecified cerebral artery occlusion with cerebral infarction    Vitamin D deficiency     Current Outpatient Medications on File Prior to Visit  Medication Sig Dispense Refill   albuterol (PROVENTIL HFA) 108 (90 Base) MCG/ACT inhaler 1 to 2 inhalations 10-15 minutes apart every 4 hours if needed for asthma rescue 48 g 3   ALPRAZolam (XANAX) 0.5 MG tablet Take 1/2 - 1 tablet at Bedtime  ONLY if needed forSleep &  limit to 5 days /week to avoid Addiction & Dementia (Dx: g47.0) (Patient taking differently: Take 1/2 - 1 tablet at Bedtime  ONLY if needed forSleep &  limit to 5 days /week to avoid Addiction & Dementia (Dx: g47.0)) 30 tablet 0   amphetamine-dextroamphetamine (ADDERALL) 10 MG tablet Take  1/2 - 1 tablet  1 or 2 x /day  for ADD (Patient taking differently: Take  1/2 - 1 tablet  1 or 2 x /day  for ADD  1 tab Monday-Friday) 60 tablet 0   aspirin EC 81 MG tablet Take 81 mg by mouth daily.     botulinum toxin Type A (BOTOX) 100 units SOLR injection Inject 300 Units into the muscle every 3 (three) months.     BREO ELLIPTA 100-25  MCG/INH AEPB USE 1 INHALATION DAILY TO PREVENT ASTHMA 180 each 3   buPROPion (WELLBUTRIN XL) 150 MG 24 hr tablet TAKE 1 TABLET EVERY MORNING FOR MOOD. FOCUS & CONCENTRATION 90 tablet 3   butalbital-acetaminophen-caffeine (FIORICET) 50-325-40 MG tablet TAKE 1 TABLET BY MOUTH DAILY AS NEEDED FOR HEADACHE. DO NOT REFILL IN LESS THAN 30 DAYS, 12 tablet 4   cetirizine (ZYRTEC) 10 MG tablet Take 10 mg by mouth daily.     Estradiol 10 MCG TABS vaginal tablet Place 10 mcg vaginally 2 (two) times a week.     fenofibrate micronized (LOFIBRA) 134 MG capsule TAKE 1 CAPSULE DAILY FOR TRIGLYCERIDES (BLOOD FATS) 90 capsule 3   levothyroxine (SYNTHROID) 88 MCG tablet TAKE 1 TABLET DAILY ON AN EMPTY STOMACH WITH ONLY WATER FOR 30 MINUTES & NO ANTACID MEDS, CALCIUM OR MAGNESIUM FOR 4 HOURS & AVOID BIOTIN 90 tablet 3   magnesium oxide (MAG-OX) 400 MG tablet Take 400 mg by mouth 2 (two) times daily.      montelukast (SINGULAIR) 10 MG tablet TAKE 1 TABLET BY MOUTH AT BEDTIME FOR ALLERGIES 90 tablet 1   nortriptyline (PAMELOR) 25 MG capsule TAKE 1 CAPSULE BY MOUTH AT BEDTIME. 90 capsule 3  omeprazole (PRILOSEC) 40 MG capsule Take  1 capsule  Daily  to Prevent Heartburn & Indigestion / Patient knows to take by mouth 90 capsule 3   ondansetron (ZOFRAN ODT) 8 MG disintegrating tablet Dissolve 1 tablet under tongue every 6 to 8 hours for nausea  or vomitting 30 tablet 0   Riboflavin 100 MG TABS Take 100 mg by mouth. bid     rosuvastatin (CRESTOR) 40 MG tablet Take  1 tablet  Daily  for Cholesterol 90 tablet 3   Ubrogepant (UBRELVY) 100 MG TABS Take 100 mg by mouth as needed. 12 tablet 11   verapamil (CALAN-SR) 240 MG CR tablet TAKE 1 TABLET DAILY WITH FOOD FOR BP & MIGRAINE PREVENTION 90 tablet 3   vitamin B-12 (CYANOCOBALAMIN) 1000 MCG tablet Take 1,000 mcg by mouth daily.     Vitamin D, Ergocalciferol, (DRISDOL) 1.25 MG (50000 UT) CAPS capsule Take 1 capsule daily for Vitamin D deficiency. (Patient taking differently:  Take 50,000 Units by mouth every 7 (seven) days.) 90 capsule 1   zinc gluconate 50 MG tablet Take 50 mg by mouth daily.     No current facility-administered medications on file prior to visit.    Past Surgical History:  Procedure Laterality Date   AUGMENTATION MAMMAPLASTY Bilateral 2000   COSMETIC SURGERY     CYSTOCELE REPAIR N/A 01/08/2013   Procedure: ANTERIOR REPAIR (CYSTOCELE);  Surgeon: Meriel Pica, MD;  Location: [facility];  Service: Gynecology;  Laterality: N/A;   NASAL SINUS SURGERY     VAGINAL HYSTERECTOMY      Allergies  Allergen Reactions   Penicillins Anaphylaxis and Hives   Imitrex [Sumatriptan]    Other Swelling    kiwi   Sulfa Antibiotics Hives   Zomig [Zolmitriptan]     Ht 5\' 9"  (1.753 m)   Wt 165 lb (74.8 kg)   BMI 24.37 kg/m   No flowsheet data found.  No flowsheet data found.      Objective:  Physical Exam:  Gen: NAD, comfortable in exam room  Left foot/ankle: Calcaneal varus with pes cavus.  No hallux rigidus.  No other gross deformity, swelling, ecchymoses Decreased dorsiflexion and external rotation of ankle. No TTP currently Thompsons test negative. NV intact distally.  Right foot/ankle: Pes cavus.  Hallux valgus.  No hallux rigidus.  No other deformity. FROM. No tenderness.  Gait: Lands in supination, significantly so on the left.  No other abnormalities.   Assessment & Plan:  1. Gait abnormality - orthotics made today as noted below.  Continue with her home exercises, treatment with neuro.  Patient was fitted for a : standard, cushioned, semi-rigid orthotic. The orthotic was heated and afterward the patient stood on the orthotic blank positioned on the orthotic stand. The patient was positioned in subtalar neutral position and 10 degrees of ankle dorsiflexion in a weight bearing stance. After completion of molding, a stable base was applied to the orthotic blank. The blank was ground to a stable position for weight  bearing. Size: 8 Base: blue med density eva Posting: lateral post Additional orthotic padding: Lateral heel wedge

## 2021-01-01 ENCOUNTER — Other Ambulatory Visit: Payer: Self-pay | Admitting: Internal Medicine

## 2021-01-01 ENCOUNTER — Other Ambulatory Visit: Payer: Self-pay | Admitting: Adult Health

## 2021-01-07 ENCOUNTER — Other Ambulatory Visit: Payer: Self-pay

## 2021-01-07 DIAGNOSIS — F988 Other specified behavioral and emotional disorders with onset usually occurring in childhood and adolescence: Secondary | ICD-10-CM

## 2021-01-07 DIAGNOSIS — F419 Anxiety disorder, unspecified: Secondary | ICD-10-CM

## 2021-01-08 MED ORDER — AMPHETAMINE-DEXTROAMPHETAMINE 10 MG PO TABS: ORAL_TABLET | ORAL | 0 refills | Status: DC

## 2021-01-08 MED ORDER — ALPRAZOLAM 0.5 MG PO TABS: ORAL_TABLET | ORAL | 0 refills | Status: DC

## 2021-01-09 DIAGNOSIS — Z1211 Encounter for screening for malignant neoplasm of colon: Secondary | ICD-10-CM | POA: Diagnosis not present

## 2021-01-09 DIAGNOSIS — K5730 Diverticulosis of large intestine without perforation or abscess without bleeding: Secondary | ICD-10-CM | POA: Diagnosis not present

## 2021-01-09 LAB — HM COLONOSCOPY

## 2021-01-10 ENCOUNTER — Other Ambulatory Visit: Payer: Self-pay | Admitting: Internal Medicine

## 2021-01-12 ENCOUNTER — Encounter: Payer: Self-pay | Admitting: Internal Medicine

## 2021-01-16 ENCOUNTER — Other Ambulatory Visit: Payer: Self-pay | Admitting: Internal Medicine

## 2021-01-20 ENCOUNTER — Other Ambulatory Visit: Payer: Self-pay

## 2021-01-20 ENCOUNTER — Ambulatory Visit
Admission: RE | Admit: 2021-01-20 | Discharge: 2021-01-20 | Disposition: A | Discharge status: Home / Self Care | Payer: BC Managed Care – PPO | Source: Ambulatory Visit | Attending: Internal Medicine | Admitting: Internal Medicine

## 2021-01-20 DIAGNOSIS — Z1231 Encounter for screening mammogram for malignant neoplasm of breast: Secondary | ICD-10-CM

## 2021-01-21 ENCOUNTER — Ambulatory Visit (INDEPENDENT_AMBULATORY_CARE_PROVIDER_SITE_OTHER): Payer: BC Managed Care – PPO | Admitting: Neurology

## 2021-01-21 ENCOUNTER — Encounter: Payer: Self-pay | Admitting: Neurology

## 2021-01-21 ENCOUNTER — Other Ambulatory Visit: Payer: Self-pay | Admitting: Neurology

## 2021-01-21 VITALS — BP 131/87 | HR 96 | Ht 69.0 in | Wt 165.0 lb

## 2021-01-21 DIAGNOSIS — G40209 Localization-related (focal) (partial) symptomatic epilepsy and epileptic syndromes with complex partial seizures, not intractable, without status epilepticus: Secondary | ICD-10-CM | POA: Diagnosis not present

## 2021-01-21 DIAGNOSIS — G43709 Chronic migraine without aura, not intractable, without status migrainosus: Secondary | ICD-10-CM | POA: Diagnosis not present

## 2021-01-21 DIAGNOSIS — I69954 Hemiplegia and hemiparesis following unspecified cerebrovascular disease affecting left non-dominant side: Secondary | ICD-10-CM

## 2021-01-21 MED ORDER — ONABOTULINUMTOXINA 100 UNITS IJ SOLR
300.0000 [IU] | Freq: Once | INTRAMUSCULAR | Status: AC
  Administered 2021-01-21: 300 [IU] via INTRAMUSCULAR

## 2021-01-21 MED ORDER — LAMOTRIGINE 100 MG PO TABS: 100.0000 mg | ORAL_TABLET | Freq: Two times a day (BID) | ORAL | 4 refills | Status: DC

## 2021-01-21 MED ORDER — LAMOTRIGINE 25 MG PO TABS: ORAL_TABLET | ORAL | 0 refills | Status: DC

## 2021-01-21 NOTE — Progress Notes (Signed)
ASSESSMENT AND PLAN  Ruth Gregory is a 60 y.o. female   Chronic migraine headaches  Continue preventive medications, nortriptyline 25 mg every night,   Ubrevyl 100 mg as needed.  History of right internal carotid artery dissection, right MCA stroke in June 2009  Probable complex partial seizure EEG Reported most recent episode was on January 18, 2021, no driving until seizure-free for 6 months She did not have similar episode while taking Topamax in the past (as migraine prevention) Start lamotrigine, titrating to 100 mg twice a day    Spastic left hemiparesis following right internal carotid artery dissection, right MCA stroke in June 2000    EMG guided Botox injection, used 300 units of Botox A total   Left pronator teres 25 units Left flexor digitorum superficialis 25 units Left pectoralis major 25 units Left brachialis 25 units Left palmaris longus 25 units Left flexor digitorum profundus a 25 units  Left flexor digitorum longus 25 units Left tibialis posterior 25 units    I also injected  100 units for chronic migraine headache Bilateral temporalis multiple injection site for a total of 40 units Bilateral parietal region 20 units Left levator scapula 25 units Left upper trapezius 15 units   We will change to Botox 300 units, Ubrelvy as needed for abortive treatment DIAGNOSTIC DATA (LABS, IMAGING, TESTING) - I reviewed patient records, labs, notes, testing and imaging myself where available. HISTORICAL  Ruth Gregory is a 60 year old right-handed female  History of right internal carotid section with spastic left hemiparesis, Botox injection every 3 months  She has history of right internal carotid artery dissection following an episode of vomiting from migraine headache in mid June 2009. She was given IV TPA and found to have right MCA occlusion. She underwent emergent right carotid artery stent with distal endovascular recanalization of the middle  cerebral artery.  She had small hemorrhagic transformation of the right basal ganglia with mild left hemiparesis, but has done well since then and has been independent.  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  She has been receiving BOTOX injection since 01/2009 every 3 months, to her left upper extremity and also left lower extremity, responded well.   Repeat US carotid were normal in October 2013  Chronic migraine headaches: She had long-standing history of chronic migraine, average 2-4 headaches each months, she has increased frequency headaches around fall, and spring, she is now taking Topamax ER 150 mg every night as preventive medication, magnesium oxide 400 mg, riboflavin 100 mg twice a day,  She is not candidate for triptan because previous history of stroke, previously Cambia works well for her, she her insurance only allow her to have 9 tablets each months. Occasionally she take Fioricet, hydrocodone as needed, even prednisone 20 mg as needed as rescue therapy  Sometimes she received Botox injection as migraine prevention, previously taking Topamax for many years, there was no reported seizure-like spells,  Her migraine eventually responded very well to [place] as needed, she tapered  off Topamax few month ago, since May, she reported recurrent 2 episode of sudden onset transient confusion, most recent episode was on January 18, 2021, while driving, she began to turn towards the left  side, had transient confusion, visual distortion, likely there was no incident, there was no seizure-like activity  We personally reviewed CT head in June 2009, right MCA infarction stable hemorrhagic component, right to left shift    PHYSICAL EXAM   Vitals:   12/26/19 1427  BP: 140/84  Pulse: 92  Weight: 162 lb (73.5 kg)  Height: 5' 8.5" (1.74 m)   Body mass index is 24.27 kg/m.  Gen: NAD, conversant, well nourised, well groomed                     Cardiovascular: Regular rate rhythm, no peripheral edema, warm, nontender. Eyes: Conjunctivae clear without exudates or hemorrhage Neck: Supple, no carotid bruits. Pulmonary: Clear to auscultation bilaterally   NEUROLOGICAL EXAM:  MENTAL STATUS: Speech/Cognition: Awake, alert, normal speech, oriented to history taking and casual conversation.  CRANIAL NERVES: CN II: Visual fields are full to confrontation.  Pupils are round equal and briskly reactive to light. CN III, IV, VI: extraocular movement are normal. No ptosis. CN V: Facial sensation is intact to light touch. CN VII: Face is symmetric with normal eye closure and smile. CN VIII: Hearing is normal to casual conversation CN IX, X: Palate elevates symmetrically. Phonation is normal. CN XI: Head turning and shoulder shrug are intact  MOTOR: Mild spastic left hemiparesis, tendency for left finger flexion, and left ankle plantarflexion  REFLEXES: Hyperreflexia on the left upper and lower extremity  SENSORY: Intact to light touch, pinprick, positional and vibratory sensation at fingers and toes.  COORDINATION: There is no trunk or limb ataxia.    GAIT/STANCE: Mildly unsteady gait, tendency for left ankle plantarflexion  Review of system: Pertinent as above   ALLERGIES: Allergies  Allergen Reactions   Penicillins Anaphylaxis and Hives   Imitrex [Sumatriptan]    Other Swelling    kiwi   Sulfa Antibiotics Hives   Zomig [Zolmitriptan]     HOME  MEDICATIONS: Current Outpatient Medications  Medication Sig Dispense Refill   albuterol (PROVENTIL HFA) 108 (90 Base) MCG/ACT inhaler 1 to 2 inhalations 10-15 minutes apart every 4 hours if needed for asthma rescue 48 g 3   ALPRAZolam (XANAX) 0.5 MG tablet Take 1/2-1 tablet at Bedtime ONLY if needed for Sleep 30 tablet 0   amphetamine-dextroamphetamine (ADDERALL) 10 MG tablet Take 1/2 to 1 tablet 1 or 2 x daily for ADD 60 tablet 0   aspirin EC 81 MG tablet Take 81 mg by mouth daily.     augmented betamethasone dipropionate (DIPROLENE-AF) 0.05 % cream Apply topically 2 (two) times daily as needed.     Botulinum Toxin Type A (BOTOX) 200 UNITS SOLR Inject 400 Units as directed every 3 (three) months. Change to BOTOX A 100  units     BREO ELLIPTA 100-25 MCG/INH AEPB 1 INHALATION DAILY TO PREVENT ASTHMA 180 each 3   buPROPion (WELLBUTRIN XL) 150 MG 24 hr tablet TAKE 1 TABLET EVERY MORNING FOR MOOD. FOCUS & CONCENTRATION 30 tablet 2   butalbital-acetaminophen-caffeine (FIORICET) 50-325-40 MG tablet Take 1 tablet by mouth daily as needed for headache. Do not refill in less than 30 days, 12 tablet 5   cetirizine (ZYRTEC) 10 MG tablet Take 10 mg by mouth daily.     dexamethasone (DECADRON) 4 MG tablet Take 1 tab  3 x day - 3 days, then 2 x day - 3 days, then 1 tab daily 20 tablet 0   Diclofenac Potassium,Migraine, (CAMBIA) 50 MG PACK MIX 1 PACK WITH 1-2 OUNCES OF WATER AND DRINK AT ONSET OF MIGRAINE CAN REPEAT IN 2 HOURS IF NEEDED MAX 2/24 HOURS 8 each 11   diclofenac Sodium (VOLTAREN) 1 % GEL Apply 4 g topically 4 (four) times daily as needed. 500 g 6   Estradiol (YUVAFEM) 10 MCG TABS vaginal tablet Place 10 mcg vaginally 2 (two) times a week.     fenofibrate micronized (LOFIBRA) 134 MG capsule TAKE 1 CAPSULE EVERY DAY 90 capsule 3   levothyroxine (SYNTHROID) 88 MCG tablet Take 1 tablet daily on an empty stomach with only water for 30 minutes & no Antacid meds, Calcium or Magnesium for 4 hours & avoid  Biotin 90 tablet 0   magnesium oxide (MAG-OX) 400 MG tablet Take 400 mg by mouth 2 (two) times daily.      montelukast (SINGULAIR) 10 MG tablet Take 1 tablet Daily  for Allergies. 90 tablet 3   Multiple Vitamin (MULTIVITAMIN WITH MINERALS) TABS tablet Take 1 tablet by mouth daily.     Multiple Vitamins-Minerals (ZINC PO) Take 1 tablet by mouth daily.     nortriptyline (PAMELOR) 25 MG capsule Take 25 mg by mouth at bedtime.     omeprazole (PRILOSEC) 40 MG capsule TAKE 1 CAPSULE DAILY FOR ACID INDIGESTION & REFLUX 90 capsule 3   ondansetron (ZOFRAN ODT) 8 MG disintegrating tablet Dissolve 1 tablet under tongue every 6 to 8 hours for nausea  or vomitting 30 tablet 0   Riboflavin 100 MG TABS Take 100 mg by mouth. bid     rosuvastatin (CRESTOR) 40 MG tablet Take 1 tablet (40 mg total) by mouth daily. Take 1 tablet Daily for Cholesterol 90 tablet 3   topiramate (TOPAMAX) 100 MG tablet Take 2 tablets (200 mg total) by mouth daily. 180 tablet 3   verapamil (CALAN-SR) 240 MG CR tablet TAKE 1 TABLET DAILY WITH FOOD FOR BP & MIGRAINE PREVENTION 90 tablet 3   vitamin B-12 (CYANOCOBALAMIN) 1000 MCG tablet Take 1,000 mcg by mouth daily.     Vitamin D, Ergocalciferol, (DRISDOL) 1.25 MG (50000 UT) CAPS capsule Take 1 capsule daily for Vitamin D deficiency. 90 capsule 1   Erenumab-aooe (AIMOVIG) 70 MG/ML SOAJ Inject 70 mg into the skin every 30 (thirty) days. 1 mL 11   Ubrogepant (UBRELVY) 50 MG TABS Take 50 mg by mouth as needed. May repeat once in 2 hours 12 tablet 11   No current facility-administered medications for this visit.    PAST MEDICAL HISTORY: Past Medical History:  Diagnosis Date   Anemia    Anxiety    Asthma    Chronic heartburn    Depression    Elevated cholesterol    on Crestor   Headache(784.0)    Hypertension    Hypothyroidism    S/P thyroidectomy   Migraine    Occlusion and stenosis of carotid artery with cerebral infarction    Spasm of muscle    Unspecified cerebral artery  occlusion with cerebral infarction    Vitamin D deficiency     PAST SURGICAL HISTORY: Past Surgical History:  Procedure Laterality Date   AUGMENTATION MAMMAPLASTY Bilateral 2000   COSMETIC SURGERY     CYSTOCELE REPAIR N/A 01/08/2013   Procedure: ANTERIOR REPAIR (CYSTOCELE);  Surgeon: Meriel Pica, MD;  Location: [facility];  Service: Gynecology;  Laterality:  N/A;   NASAL SINUS SURGERY     VAGINAL HYSTERECTOMY      FAMILY HISTORY: Family History  Problem Relation Age of Onset   Heart disease Mother    Peptic Ulcer Disease Mother    Heart disease Father    Parkinson's disease Father     SOCIAL HISTORY:  Social History   Socioeconomic History   Marital status: Married    Spouse name: Brett Canales   Number of children: 2   Years of education: college   Highest education level: Not on file  Occupational History   Occupation: DESIGNER    Employer: [employer]  Tobacco Use   Smoking status: Never Smoker   Smokeless tobacco: Never Used  Substance and Sexual Activity   Alcohol use: Yes    Comment: occassionally drinks a glass of wine   Drug use: No   Sexual activity: Not on file  Other Topics Concern   Not on file  Social History Narrative   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 Patient is married and lives with her husband. Patient has college education.   Right handed.   Caffeine- two cups daily.   Social Determinants of Health   Financial Resource Strain:    Difficulty of Paying Living Expenses: Not on file  Food Insecurity:    Worried About Programme researcher, broadcasting/film/video in the Last Year: Not on file   [employer] of Food in the Last Year: Not on file  Transportation Needs:    Lack of Transportation (Medical): Not on file   Lack of Transportation (Non-Medical): Not on file  Physical Activity:    Days of Exercise per Week: Not on file   Minutes of Exercise per Session: Not on file  Stress:    Feeling of Stress : Not on file  Social Connections:    Frequency of  Communication with Friends and Family: Not on file   Frequency of Social Gatherings with Friends and Family: Not on file   Attends Religious Services: Not on file   Active Member of Clubs or Organizations: Not on file   Attends Banker Meetings: Not on file   Marital Status: Not on file  Intimate Partner Violence:    Fear of Current or Ex-Partner: Not on file   Emotionally Abused: Not on file   Physically Abused: Not on file   Sexually Abused: Not on file      Levert Feinstein, M.D. Ph.D.  [facility] [address], [address] [place], [place] [address] Ph: [phone] Fax: [phone]

## 2021-01-21 NOTE — Progress Notes (Signed)
Botox- 300 units x 3 vial Lot: [id number] Expiration: 01/2023 NDC: 0349-1791-50    B/B

## 2021-01-21 NOTE — Patient Instructions (Signed)
Meds ordered this encounter  Medications   lamoTRIgine (LAMICTAL) 25 MG tablet    Sig: 1 tab bid x one week 2 tab bid x 2nd week 3 tab bid x 3rd week    Dispense:  84 tablet    Refill:  0   lamoTRIgine (LAMICTAL) 100 MG tablet    Sig: Take 1 tablet (100 mg total) by mouth 2 (two) times daily.    Dispense:  180 tablet    Refill:  4   If you develop rash, call us.     If you are doing well with lamotrigine, by Dec 2022, you can stop Nortriptyline

## 2021-01-22 ENCOUNTER — Telehealth: Payer: Self-pay | Admitting: Neurology

## 2021-01-22 NOTE — Telephone Encounter (Addendum)
Tried calling pt back to discuss. LVM for her to call back.  Per last OV notes:  Dr. Terrace Arabia sent in two prescriptions for lamtrigine. She should start with 25mg  tablet and titrate up per instructions. Then she would start on 100mg  after finishing that. If she develops rash on lamotrigine, call our office. If doing well with lamotrigine by Dec 2022, she can stop Nortriptyline.   lamoTRIgine (LAMICTAL) 25 MG tablet      Sig: 1 tab bid x one week 2 tab bid x 2nd week 3 tab bid x 3rd week      Dispense:  84 tablet      Refill:  0   lamoTRIgine (LAMICTAL) 100 MG tablet      Sig: Take 1 tablet (100 mg total) by mouth 2 (two) times daily.      Dispense:  180 tablet      Refill:  4

## 2021-01-22 NOTE — Telephone Encounter (Signed)
Pt called needing to speak to the RN regarding her Lamotrigine and her Nortriptyline. Please advise.

## 2021-01-26 NOTE — Telephone Encounter (Signed)
Attempted to call pt, LVM  

## 2021-01-27 NOTE — Telephone Encounter (Signed)
Sent a mychart message to patient with medication details.

## 2021-01-28 ENCOUNTER — Ambulatory Visit: Payer: BC Managed Care – PPO | Admitting: Neurology

## 2021-01-29 ENCOUNTER — Ambulatory Visit: Payer: BC Managed Care – PPO | Admitting: Neurology

## 2021-02-03 ENCOUNTER — Other Ambulatory Visit: Payer: Self-pay | Admitting: Adult Health

## 2021-02-03 MED ORDER — FLUTICASONE FUROATE-VILANTEROL 100-25 MCG/ACT IN AEPB
1.0000 | INHALATION_SPRAY | Freq: Every day | RESPIRATORY_TRACT | 3 refills | Status: DC
Start: 2021-02-03 — End: 2021-10-11

## 2021-02-04 ENCOUNTER — Ambulatory Visit (INDEPENDENT_AMBULATORY_CARE_PROVIDER_SITE_OTHER): Payer: BC Managed Care – PPO | Admitting: Neurology

## 2021-02-04 DIAGNOSIS — I69954 Hemiplegia and hemiparesis following unspecified cerebrovascular disease affecting left non-dominant side: Secondary | ICD-10-CM

## 2021-02-04 DIAGNOSIS — G43709 Chronic migraine without aura, not intractable, without status migrainosus: Secondary | ICD-10-CM

## 2021-02-04 DIAGNOSIS — G40209 Localization-related (focal) (partial) symptomatic epilepsy and epileptic syndromes with complex partial seizures, not intractable, without status epilepticus: Secondary | ICD-10-CM

## 2021-02-05 ENCOUNTER — Telehealth: Payer: Self-pay | Admitting: Neurology

## 2021-02-05 NOTE — Telephone Encounter (Signed)
Pt called, pharmacy refused to fill lamoTRIgine (LAMICTAL) 100 MG tablet and lamoTRIgine (LAMICTAL) 25 MG tablet because of the different in the milligrams. Would like a call from the nurse

## 2021-02-05 NOTE — Telephone Encounter (Signed)
I called the pharmacy. They have the lamotrigine 25mg  ready to pick up. She will use the lower dose for titration purposes. The other prescription for 100mg  tablets will be placed on hold until she is ready to transition to this dose.

## 2021-02-16 ENCOUNTER — Other Ambulatory Visit: Payer: Self-pay | Admitting: Nurse Practitioner

## 2021-02-16 DIAGNOSIS — F419 Anxiety disorder, unspecified: Secondary | ICD-10-CM

## 2021-02-16 DIAGNOSIS — F988 Other specified behavioral and emotional disorders with onset usually occurring in childhood and adolescence: Secondary | ICD-10-CM

## 2021-02-17 DIAGNOSIS — Z6826 Body mass index (BMI) 26.0-26.9, adult: Secondary | ICD-10-CM | POA: Diagnosis not present

## 2021-02-17 DIAGNOSIS — Z01419 Encounter for gynecological examination (general) (routine) without abnormal findings: Secondary | ICD-10-CM | POA: Diagnosis not present

## 2021-02-17 MED ORDER — ALPRAZOLAM 0.5 MG PO TABS: ORAL_TABLET | ORAL | 0 refills | Status: DC

## 2021-02-17 MED ORDER — AMPHETAMINE-DEXTROAMPHETAMINE 10 MG PO TABS: ORAL_TABLET | ORAL | 0 refills | Status: DC

## 2021-02-23 ENCOUNTER — Other Ambulatory Visit: Payer: Self-pay | Admitting: Neurology

## 2021-02-27 ENCOUNTER — Encounter: Payer: Self-pay | Admitting: Neurology

## 2021-03-01 NOTE — Progress Notes (Signed)
     C  A N  C  E L  L  E  D          

## 2021-03-02 ENCOUNTER — Ambulatory Visit: Payer: BC Managed Care – PPO | Admitting: Internal Medicine

## 2021-03-04 ENCOUNTER — Telehealth: Payer: Self-pay | Admitting: Neurology

## 2021-03-04 NOTE — Procedures (Signed)
   HISTORY: 60 year old female history of right MCA stroke, presented withTransient confusional episode  TECHNIQUE:  This is a routine 16 channel EEG recording with one channel devoted to a limited EKG recording.  It was performed during wakefulness, drowsiness and asleep.  Hyperventilation and photic stimulation were performed as activating procedures.  There are minimum muscle and movement artifact noted.  Upon maximum arousal, posterior dominant waking rhythm consistent of rhythmic alpha range activity, . Activities are symmetric over the bilateral posterior derivations and attenuated with eye opening.  There was noticeable intermittent slowing involving F4, C4, P4, with no epileptiform discharge  Hyperventilation produced mild/moderate buildup with higher amplitude and the slower activities noted.  Photic stimulation did not alter the tracing.  During EEG recording, patient developed drowsiness and no deeper stage of sleep was achieved  During EEG recording, there was no epileptiform discharge noted.  EKG demonstrate sinus rhythm, with heart rate of 76 bpm  CONCLUSION: This is a mild abnormal EEG, there is intermittent protrusion of slower theta range activity involving right frontotemporal region, consistent with her history of right MCA stroke.  There was no epileptiform discharge,   Levert Feinstein, M.D. Ph.D.  [facility] [address] [place], [place] [address] Phone: [phone] Fax:      [phone]

## 2021-03-04 NOTE — Telephone Encounter (Signed)
I have called patient, explained the EEG findings, mild slowing at the right temporal parietal region, consistent with her history of right MCA stroke,  She is still at high risk for recurrent partial seizure, previously complained side effects with Topamax,  We have started her on lamotrigine 25 mg titrating dose, but she came back to lamotrigine after just few tries, complains of GI side effect, doubt it is truly due to lamotrigine  I have suggested rechallenge herself with slower titrating schedule,  Lamotrigine 25 mg 1 tablet twice a day for 1 week 1 tablet twice a day for 1 week 2 tablets twice a day for 1 week 3 tablets daily for 1 week  Then lamotrigine 100 mg twice a day,  Call office for rash, or other concerns.

## 2021-03-05 ENCOUNTER — Other Ambulatory Visit: Payer: Self-pay | Admitting: Adult Health

## 2021-03-05 DIAGNOSIS — R040 Epistaxis: Secondary | ICD-10-CM | POA: Diagnosis not present

## 2021-03-09 NOTE — Progress Notes (Signed)
Future Appointments  Date Time Provider Department  03/10/2021 11:30 AM Lucky Cowboy, MD [facility]  04/02/2021  1:30 PM Levert Feinstein, MD [facility]  04/29/2021  3:15 PM Levert Feinstein, MD [facility]  05/07/2021           6 mo OV 11:00 AM Revonda Humphrey, NP [facility]  11/03/2021          CPE 11:00 AM Lucky Cowboy, MD [facility]    History of Present Illness:     Patient is a very nice  (& anxious)  60 yo MWF with  HTN, HLD, ADD , Prediabetes, migraines, hx/ mild Lt HP 20   Carotid Aa dissection  and Vitamin D Deficiency who had recent EEG suspect for seizures & had med change from Topiramate to Lamictal. Dr Terrace Arabia follows for Bo Tox injections for her post CVA spasticity. And also manages her Migraines.    Medications     levothyroxine (SYNTHROID) 88 MCG tablet, TAKE 1 TABLET DAILY     fenofibrate micronized (LOFIBRA) 134 MG capsule, TAKE 1 CAPSULE DAILY    rosuvastatin (CRESTOR) 40 MG tablet, Take  1 tablet  Daily  for Cholesterol   verapamil (CALAN-SR) 240 MG CR tablet, TAKE 1 TABLET DAILY     albuterol  HFA inhaler, 1 to 2 inhalations 10-15 minutes apart every 4 hours if needed for asthma rescue   cetirizine 10 MG tablet, Take 10 mg by mouth daily.   BREO ELLIPTA 100-25  Inhale 1 puff daily.   montelukast (SINGULAIR) 10 MG tablet, TAKE 1 TABLET BY MOUTH AT BEDTIME FOR ALLERGIES   aspirin EC 81 MG tablet,  daily.    butalbital-acetaminophen-caffeine (FIORICET) 50-325-40 MG tablet, TAKE 1 TABLET DAILY AS NEEDED FOR HEADACHE. DO NOT REFILL IN LESS THAN 30 DAYS,   Ubrogepant (UBRELVY) 100 MG TABS, Take 100 mg by mouth as needed.  Current Outpatient Medications (Hematological):    vitamin B-12 (CYANOCOBALAMIN) 1000 MCG tablet, Take 1,000 mcg by mouth daily.     ALPRAZolam 0.5 MG tablet, Take 1/2 - 1 tablet at Bedtime  ONLY if needed forSleep &  limit to 5 days /week to avoid Addiction & Dementia    ADDERALL) 10 MG tablet, Take  1/2 - 1 tablet  1 or 2 x /day  for ADD   botulinum toxin Type A  (BOTOX) 100 units SOLR injection, Inject 300 Units into the muscle every 3 (three) months.   buPROPion XL 150 MG 24 hr tablet, TAKE 1 TABLET EVERY MORNING    Estradiol 10 MCG TABS vaginal tablet, Place 10 mcg vaginally 2  times a week.   lamoTRIgine (LAMICTAL) 100 MG tablet, Take 1 tablet (100 mg total) by mouth 2 (two) times daily.   lamoTRIgine (LAMICTAL) 25 MG tablet, 1 tab bid x one week 2 tab bid x 2nd week 3 tab bid x 3rd week   magnesium oxide (MAG-OX) 400 MG tablet, Take 400 mg by mouth 2 (two) times daily.    nortriptyline 25 MG capsule, TAKE 1 CAPSULE BY MOUTH AT BEDTIME.   omeprazole 40 MG capsule, Take  1 capsule  Daily  to Prevent Heartburn & Indigestion / Patient knows to take by mouth   ondansetron-ODT) 8 MG disintegrating tablet, DISSOLVE 1 TABLET UNDER TONGUE EVERY 6 TO 8 HOURS FOR NAUSEA OR VOMITTING   Riboflavin 100 MG TABS, Take  bid   Vitamin D 50,000  b nu, TAKE 1 CAPSULE DAILY FOR VITAMIN D DEFICIENCY.   zinc 50 MG  tablet, Take  daily.  Problem list She has History of CVA (cerebrovascular accident); Chronic migraine; Spastic hemiplegia affecting left nondominant side (HCC); Hyperlipidemia, mixed; Sinusitis, chronic; Vitamin D deficiency; Medication management; History of prediabetes; ADD (attention deficit disorder) without hyperactivity; Hypothyroidism; GERD (gastroesophageal reflux disease); Anxiety; Fatty liver; Abnormal glucose; Labile hypertension; Spastic hemiplegia of left nondominant side as late effect of cerebrovascular disease (HCC); Chronic migraine w/o aura w/o status migrainosus, not intractable; B12 deficiency; and Partial symptomatic epilepsy with complex partial seizures, not intractable, without status epilepticus (HCC) on their problem list.   Observations/Objective:  BP 128/80   Pulse 95   Temp 97.9 F (36.6 C)   Resp 16   Ht 5\' 9"  (1.753 m)   Wt 169 lb 9.6 oz (76.9 kg)   SpO2 97%   BMI 25.05 kg/m   HEENT - WNL. Neck - supple.  Chest - Clear  equal BS. Cor - Nl HS. RRR w/o sig MGR. PP 1(+). No edema. MS- FROM w/o deformities.  Gait Nl. Neuro -  Nl w/o focal abnormalities.  Assessment and Plan:  1. Labile hypertension   2. Hyperlipidemia, mixed  - discussed diet   3. Chronic migraine w/o aura w/o status migrainosus, not intractable   4. Need for immunization against influenza  - Flu Vaccine QUAD 6+ mos PF IM (Fluarix Quad PF)  5. Medication management   Follow Up Instructions:        I discussed the assessment and treatment plan with the patient. The patient was provided an opportunity to ask questions and all were answered. The patient agreed with the plan and demonstrated an understanding of the instructions.  Over 15 minutes of counseling,  chart review and critical decision making was performed      The patient was advised to call back or seek an in-person evaluation if the symptoms worsen or if the condition fails to improve as anticipated.    , MD

## 2021-03-10 ENCOUNTER — Other Ambulatory Visit: Payer: Self-pay

## 2021-03-10 ENCOUNTER — Ambulatory Visit (INDEPENDENT_AMBULATORY_CARE_PROVIDER_SITE_OTHER): Payer: BC Managed Care – PPO | Admitting: Internal Medicine

## 2021-03-10 ENCOUNTER — Encounter: Payer: Self-pay | Admitting: Internal Medicine

## 2021-03-10 VITALS — BP 128/80 | HR 95 | Temp 97.9°F | Resp 16 | Ht 69.0 in | Wt 169.6 lb

## 2021-03-10 DIAGNOSIS — Z23 Encounter for immunization: Secondary | ICD-10-CM

## 2021-03-10 DIAGNOSIS — E782 Mixed hyperlipidemia: Secondary | ICD-10-CM

## 2021-03-10 DIAGNOSIS — R0989 Other specified symptoms and signs involving the circulatory and respiratory systems: Secondary | ICD-10-CM

## 2021-03-10 DIAGNOSIS — G43709 Chronic migraine without aura, not intractable, without status migrainosus: Secondary | ICD-10-CM

## 2021-03-10 DIAGNOSIS — Z79899 Other long term (current) drug therapy: Secondary | ICD-10-CM | POA: Diagnosis not present

## 2021-03-10 NOTE — Patient Instructions (Signed)

## 2021-03-19 ENCOUNTER — Other Ambulatory Visit: Payer: Self-pay | Admitting: Adult Health

## 2021-03-19 DIAGNOSIS — F419 Anxiety disorder, unspecified: Secondary | ICD-10-CM

## 2021-04-02 ENCOUNTER — Ambulatory Visit: Payer: BC Managed Care – PPO | Admitting: Neurology

## 2021-04-06 ENCOUNTER — Ambulatory Visit (INDEPENDENT_AMBULATORY_CARE_PROVIDER_SITE_OTHER): Payer: Medicare Other | Admitting: Neurology

## 2021-04-06 ENCOUNTER — Other Ambulatory Visit: Payer: Self-pay | Admitting: Neurology

## 2021-04-06 VITALS — BP 152/80 | HR 92 | Ht 69.0 in | Wt 169.0 lb

## 2021-04-06 DIAGNOSIS — G43709 Chronic migraine without aura, not intractable, without status migrainosus: Secondary | ICD-10-CM

## 2021-04-06 DIAGNOSIS — Z8673 Personal history of transient ischemic attack (TIA), and cerebral infarction without residual deficits: Secondary | ICD-10-CM

## 2021-04-06 DIAGNOSIS — I69954 Hemiplegia and hemiparesis following unspecified cerebrovascular disease affecting left non-dominant side: Secondary | ICD-10-CM | POA: Diagnosis not present

## 2021-04-06 NOTE — Progress Notes (Signed)
ASSESSMENT AND PLAN  Ruth Gregory is a 61 y.o. female   Chronic migraine headaches  Continue preventive medications, nortriptyline 25 mg every night,   Ubrevyl 100 mg as needed.  History of right internal carotid artery dissection, right MCA stroke in June 2009  Probable complex partial seizure EEG in November 2022 showed right frontotemporal focal slowing, Reported most recent episode was on January 18, 2021, no driving until seizure-free for 6 months She did not have similar episode while taking Topamax in the past (as migraine prevention) Encouraged to continue lamotrigine, titrating to 100 mg twice a day    Spastic left hemiparesis following right internal carotid artery dissection, right MCA stroke in June 2000  Every 3 months for spastic right hemiparesis injection   DIAGNOSTIC DATA (LABS, IMAGING, TESTING) - I reviewed patient records, labs, notes, testing and imaging myself where available. HISTORICAL  Ruth Gregory is a 61 year old right-handed female  History of right internal carotid section with spastic left hemiparesis, Botox injection every 3 months  She has history of right internal carotid artery dissection following an episode of vomiting from migraine headache in mid June 2009. She was given IV TPA and found to have right MCA occlusion. She underwent emergent right carotid artery stent with distal endovascular recanalization of the middle cerebral artery.  She had small hemorrhagic transformation of the right basal ganglia with mild left hemiparesis, but has done well since then and has been independent.  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  She has made marked recovery, ambulating only with very mild difficulty,  maintain majority of her left arm function,  there is mild limitation in the range of motion in her left shoulder,  mild left shoulder stiffness and pain, most bothersome symptoms is her left elbow discomfort, left wrist achy  pain, difficulty releasing left hand flexion, left arm persistent flexion,  left ankle plantar inversion, increased gait difficulty after prolonged walking, This has all been helped by Botox injection.  She has been receiving BOTOX injection since 01/2009 every 3 months, to her left upper extremity and also left lower extremity, responded well.   Repeat US carotid were normal in October 2013  Chronic migraine headaches: She had long-standing history of chronic migraine, average 2-4 headaches each months, she has increased frequency headaches around fall, and spring, she is now taking Topamax ER 150 mg every night as preventive medication, magnesium oxide 400 mg, riboflavin 100 mg twice a day,  She is not candidate for triptan because previous history of stroke, previously Cambia works well for her, she her insurance only allow her to have 9 tablets each months. Occasionally she take Fioricet, hydrocodone as needed, even prednisone 20 mg as needed as rescue therapy  Sometimes she received Botox injection as migraine prevention, previously taking Topamax for many years, there was no reported seizure-like spells,  Her migraine eventually responded very well to [place] as needed, she tapered off Topamax few month ago, since May, she reported recurrent 2 episode of sudden onset transient confusion, most recent episode was on January 18, 2021, while driving, she began to turn towards the left side, had transient confusion, visual distortion, likely there was no incident, there was no seizure-like activity  We personally reviewed CT head in June 2009, right MCA infarction stable hemorrhagic component, right to left shift  UPDATE Apr 06 2020: She complains of increased headache over the past few months, likely attributed to the stress from holiday, and also  weather changes, [name] continue to work, she takes about twice a week  She has no recurrent confusion spells, but did not titrating up lamotrigine  dosage as discussed during last office visit,  EEG in November 2022 showed intermittent protrusion of slower wave dysrhythmic involving right frontotemporal region, no epileptiform discharge, but suggest focal irritability, consistent with her history of right MCA stroke  She also complains of mild memory loss, difficulty focusing, difficulty multitasking,  PHYSICAL EXAM   Vitals:   12/26/19 1427  BP: 140/84  Pulse: 92  Weight: 162 lb (73.5 kg)  Height: 5' 8.5" (1.74 m)   Body mass index is 24.27 kg/m.  Gen: NAD, conversant, well nourised, well groomed                     Cardiovascular: Regular rate rhythm, no peripheral edema, warm, nontender. Eyes: Conjunctivae clear without exudates or hemorrhage Neck: Supple, no carotid bruits. Pulmonary: Clear to auscultation bilaterally   NEUROLOGICAL EXAM:  MENTAL STATUS: Speech/Cognition: Awake, alert, normal speech, oriented to history taking and casual conversation.  CRANIAL NERVES: CN II: Visual fields are full to confrontation.  Pupils are round equal and briskly reactive to light. CN III, IV, VI: extraocular movement are normal. No ptosis. CN V: Facial sensation is intact to light touch. CN VII: Face is symmetric with normal eye closure and smile. CN VIII: Hearing is normal to casual conversation CN IX, X: Palate elevates symmetrically. Phonation is normal. CN XI: Head turning and shoulder shrug are intact  MOTOR: Mild spastic left hemiparesis, tendency for left finger flexion, and left ankle plantarflexion  REFLEXES: Hyperreflexia on the left upper and lower extremity  SENSORY: Intact to light touch, pinprick, positional and vibratory sensation at fingers and toes.  COORDINATION: There is no trunk or limb ataxia.    GAIT/STANCE: Mildly unsteady gait, tendency for left ankle plantarflexion  Review of system: Pertinent as above   ALLERGIES: Allergies  Allergen Reactions   Penicillins Anaphylaxis and Hives    Imitrex [Sumatriptan]    Other Swelling    kiwi   Sulfa Antibiotics Hives   Zomig [Zolmitriptan]     HOME MEDICATIONS: Current Outpatient Medications  Medication Sig Dispense Refill   albuterol (PROVENTIL HFA) 108 (90 Base) MCG/ACT inhaler 1 to 2 inhalations 10-15 minutes apart every 4 hours if needed for asthma rescue 48 g 3   ALPRAZolam (XANAX) 0.5 MG tablet Take 1/2-1 tablet at Bedtime ONLY if needed for Sleep 30 tablet 0   amphetamine-dextroamphetamine (ADDERALL) 10 MG tablet Take 1/2 to 1 tablet 1 or 2 x daily for ADD 60 tablet 0   aspirin EC 81 MG tablet Take 81 mg by mouth daily.     augmented betamethasone dipropionate (DIPROLENE-AF) 0.05 % cream Apply topically 2 (two) times daily as needed.     Botulinum Toxin Type A (BOTOX) 200 UNITS SOLR Inject 400 Units as directed every 3 (three) months. Change to BOTOX A 100  units     BREO ELLIPTA 100-25 MCG/INH AEPB 1 INHALATION DAILY TO PREVENT ASTHMA 180 each 3   buPROPion (WELLBUTRIN XL) 150 MG 24 hr tablet TAKE 1 TABLET EVERY MORNING FOR MOOD. FOCUS & CONCENTRATION 30 tablet 2   butalbital-acetaminophen-caffeine (FIORICET) 50-325-40 MG tablet Take 1 tablet by mouth daily as needed for headache. Do not refill in less than 30 days, 12 tablet 5   cetirizine (ZYRTEC) 10 MG tablet Take 10 mg by mouth daily.     dexamethasone (DECADRON) 4 MG  tablet Take 1 tab 3 x day - 3 days, then 2 x day - 3 days, then 1 tab daily 20 tablet 0   Diclofenac Potassium,Migraine, (CAMBIA) 50 MG PACK MIX 1 PACK WITH 1-2 OUNCES OF WATER AND DRINK AT ONSET OF MIGRAINE CAN REPEAT IN 2 HOURS IF NEEDED MAX 2/24 HOURS 8 each 11   diclofenac Sodium (VOLTAREN) 1 % GEL Apply 4 g topically 4 (four) times daily as needed. 500 g 6   Estradiol (YUVAFEM) 10 MCG TABS vaginal tablet Place 10 mcg vaginally 2 (two) times a week.     fenofibrate micronized (LOFIBRA) 134 MG capsule TAKE 1 CAPSULE EVERY DAY 90 capsule 3   levothyroxine (SYNTHROID) 88 MCG tablet Take 1 tablet daily on  an empty stomach with only water for 30 minutes & no Antacid meds, Calcium or Magnesium for 4 hours & avoid Biotin 90 tablet 0   magnesium oxide (MAG-OX) 400 MG tablet Take 400 mg by mouth 2 (two) times daily.      montelukast (SINGULAIR) 10 MG tablet Take 1 tablet Daily  for Allergies. 90 tablet 3   Multiple Vitamin (MULTIVITAMIN WITH MINERALS) TABS tablet Take 1 tablet by mouth daily.     Multiple Vitamins-Minerals (ZINC PO) Take 1 tablet by mouth daily.     nortriptyline (PAMELOR) 25 MG capsule Take 25 mg by mouth at bedtime.     omeprazole (PRILOSEC) 40 MG capsule TAKE 1 CAPSULE DAILY FOR ACID INDIGESTION & REFLUX 90 capsule 3   ondansetron (ZOFRAN ODT) 8 MG disintegrating tablet Dissolve 1 tablet under tongue every 6 to 8 hours for nausea  or vomitting 30 tablet 0   Riboflavin 100 MG TABS Take 100 mg by mouth. bid     rosuvastatin (CRESTOR) 40 MG tablet Take 1 tablet (40 mg total) by mouth daily. Take 1 tablet Daily for Cholesterol 90 tablet 3   topiramate (TOPAMAX) 100 MG tablet Take 2 tablets (200 mg total) by mouth daily. 180 tablet 3   verapamil (CALAN-SR) 240 MG CR tablet TAKE 1 TABLET DAILY WITH FOOD FOR BP & MIGRAINE PREVENTION 90 tablet 3   vitamin B-12 (CYANOCOBALAMIN) 1000 MCG tablet Take 1,000 mcg by mouth daily.     Vitamin D, Ergocalciferol, (DRISDOL) 1.25 MG (50000 UT) CAPS capsule Take 1 capsule daily for Vitamin D deficiency. 90 capsule 1   Erenumab-aooe (AIMOVIG) 70 MG/ML SOAJ Inject 70 mg into the skin every 30 (thirty) days. 1 mL 11   Ubrogepant (UBRELVY) 50 MG TABS Take 50 mg by mouth as needed. May repeat once in 2 hours 12 tablet 11   No current facility-administered medications for this visit.    PAST MEDICAL HISTORY: Past Medical History:  Diagnosis Date   Anemia    Anxiety    Asthma    Chronic heartburn    Depression    Elevated cholesterol    on Crestor   Headache(784.0)    Hypertension    Hypothyroidism    S/P thyroidectomy   Migraine    Occlusion  and stenosis of carotid artery with cerebral infarction    Spasm of muscle    Unspecified cerebral artery occlusion with cerebral infarction    Vitamin D deficiency     PAST SURGICAL HISTORY: Past Surgical History:  Procedure Laterality Date   AUGMENTATION MAMMAPLASTY Bilateral 2000   COSMETIC SURGERY     CYSTOCELE REPAIR N/A 01/08/2013   Procedure: ANTERIOR REPAIR (CYSTOCELE);  Surgeon: Margarette Asal, MD;  Location: [facility];  Service: Gynecology;  Laterality: N/A;   NASAL SINUS SURGERY     VAGINAL HYSTERECTOMY      FAMILY HISTORY: Family History  Problem Relation Age of Onset   Heart disease Mother    Peptic Ulcer Disease Mother    Heart disease Father    Parkinson's disease Father     SOCIAL HISTORY:  Social History   Socioeconomic History   Marital status: Married    Spouse name: Richardson Landry   Number of children: 2   Years of education: college   Highest education level: Not on file  Occupational History   Occupation: DESIGNER    Employer: S[employer]  Tobacco Use   Smoking status: Never Smoker   Smokeless tobacco: Never Used  Substance and Sexual Activity   Alcohol use: Yes    Comment: occassionally drinks a glass of wine   Drug use: No   Sexual activity: Not on file  Other Topics Concern   Not on file  Social History Narrative   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 Patient is married and lives with her husband. Patient has college education.   Right handed.   Caffeine- two cups daily.   Social Determinants of Health   Financial Resource Strain:    Difficulty of Paying Living Expenses: Not on file  Food Insecurity:    Worried About Charity fundraiser in the Last Year: Not on file   [employer] of Food in the Last Year: Not on file  Transportation Needs:    Lack of Transportation (Medical): Not on file   Lack of Transportation (Non-Medical): Not on file  Physical Activity:    Days of Exercise per Week: Not on file   Minutes of Exercise per  Session: Not on file  Stress:    Feeling of Stress : Not on file  Social Connections:    Frequency of Communication with Friends and Family: Not on file   Frequency of Social Gatherings with Friends and Family: Not on file   Attends Religious Services: Not on file   Active Member of Clubs or Organizations: Not on file   Attends Archivist Meetings: Not on file   Marital Status: Not on file  Intimate Partner Violence:    Fear of Current or Ex-Partner: Not on file   Emotionally Abused: Not on file   Physically Abused: Not on file   Sexually Abused: Not on file      Marcial Pacas, M.D. Ph.D.  [facility] [address], [place], [place] [address] Ph: [phone] Fax: [phone]

## 2021-04-09 ENCOUNTER — Telehealth: Payer: Self-pay | Admitting: Neurology

## 2021-04-09 ENCOUNTER — Other Ambulatory Visit: Payer: Self-pay | Admitting: Neurology

## 2021-04-09 NOTE — Telephone Encounter (Signed)
Pt said pharmacy need PA for UBRELVY 100 MG TABSthat's  not covered by insurance. Also asking if [facility] have coupons for this medication to help cut cost. Would like a call from the nurse.

## 2021-04-09 NOTE — Telephone Encounter (Signed)
I attempted pa on CMM but member eligibility could not be found. I called pt's insurance and spoke with Rep Marchelle Folks. Verbal PA was accepted and instantly approved.   PA is affective 04/09/21-03/28/22 PA Case ID:  [id number].  Pt and pt's pharmacy has been updated on this approval.

## 2021-04-11 ENCOUNTER — Encounter: Payer: Self-pay | Admitting: Neurology

## 2021-04-16 ENCOUNTER — Telehealth: Payer: Self-pay | Admitting: Neurology

## 2021-04-16 NOTE — Telephone Encounter (Signed)
Patient has a Botox appointment 2/1. She now has [facility] Medicare. Obtained PA via [facility] portal. PA #[id number] (04/16/21-04/16/22). Dx: W97.948, G43.709. Patient receives 300 units.

## 2021-04-29 ENCOUNTER — Encounter: Payer: Self-pay | Admitting: Neurology

## 2021-04-29 ENCOUNTER — Ambulatory Visit: Payer: Medicare Other | Admitting: Neurology

## 2021-04-29 VITALS — BP 134/91 | HR 91 | Ht 69.0 in | Wt 169.0 lb

## 2021-04-29 DIAGNOSIS — G43709 Chronic migraine without aura, not intractable, without status migrainosus: Secondary | ICD-10-CM | POA: Diagnosis not present

## 2021-04-29 DIAGNOSIS — G40209 Localization-related (focal) (partial) symptomatic epilepsy and epileptic syndromes with complex partial seizures, not intractable, without status epilepticus: Secondary | ICD-10-CM

## 2021-04-29 DIAGNOSIS — I69954 Hemiplegia and hemiparesis following unspecified cerebrovascular disease affecting left non-dominant side: Secondary | ICD-10-CM | POA: Diagnosis not present

## 2021-04-29 MED ORDER — ONABOTULINUMTOXINA 100 UNITS IJ SOLR: 300.0000 [IU] | Freq: Once | INTRAMUSCULAR | Status: DC

## 2021-04-29 NOTE — Progress Notes (Signed)
Botox 300 units  [id number] Exp-05-2023 B/b

## 2021-04-29 NOTE — Progress Notes (Signed)
ASSESSMENT AND PLAN  Ruth Gregory is a 61 y.o. female   Chronic migraine headaches  Continue preventive medications, nortriptyline 25 mg every night,   Ubrevyl 100 mg as needed.  History of right internal carotid artery dissection, right MCA stroke in June 2009  Probable complex partial seizure EEG in November 2022 showed right frontotemporal focal slowing, Reported most recent episode was on January 18, 2021, no driving until seizure-free for 6 months She did not have similar episode while taking Topamax in the past (as migraine prevention) Encouraged to continue lamotrigine, titrating to 100 mg twice a day    Spastic left hemiparesis following right internal carotid artery dissection, right MCA stroke in June 2000  Every 3 months for spastic right hemiparesis injection  Under electric stimulation, injected Botox A 300 units  Left pronator teres 25 units Left flexor digitorum profundus 25 units Left flexor digitorum superficialis 25 units Left palmaris longus 25 units  Left pectoralis major 25 units Left upper trapezius 25 units  Left flexor digitorum longus 25 units Left tibialis posterior 25 units  Rest of the 100 units was injected at bilateral temporalis muscles according to migraine protocol.  5 units at each injection site,    DIAGNOSTIC DATA (LABS, IMAGING, TESTING) - I reviewed patient records, labs, notes, testing and imaging myself where available. HISTORICAL  Ruth Gregory is a 61 year old right-handed female  History of right internal carotid section with spastic left hemiparesis, Botox injection every 3 months  She has history of right internal carotid artery dissection following an episode of vomiting from migraine headache in mid June 2009. She was given IV TPA and found to have right MCA occlusion. She underwent emergent right carotid artery stent with distal endovascular recanalization of the middle cerebral artery.  She had small  hemorrhagic transformation of the right basal ganglia with mild left hemiparesis, but has done well since then and has been independent.  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  She has made marked recovery, ambulating only with very mild difficulty,  maintain majority of her left arm function,  there is mild limitation in the range of motion in her left shoulder,  mild left shoulder stiffness and pain, most bothersome symptoms is her left elbow discomfort, left wrist achy pain, difficulty releasing left hand flexion, left arm persistent flexion,  left ankle plantar inversion, increased gait difficulty after prolonged walking, This has all been helped by Botox injection.  She has been receiving BOTOX injection since 01/2009 every 3 months, to her left upper extremity and also left lower extremity, responded well.   Repeat US carotid were normal in October 2013  Chronic migraine headaches: She had long-standing history of chronic migraine, average 2-4 headaches each months, she has increased frequency headaches around fall, and spring, she is now taking Topamax ER 150 mg every night as preventive medication, magnesium oxide 400 mg, riboflavin 100 mg twice a day,  She is not candidate for triptan because previous history of stroke, previously Cambia works well for her, she her insurance only allow her to have 9 tablets each months. Occasionally she take Fioricet, hydrocodone as needed, even prednisone 20 mg as needed as rescue therapy  Sometimes she received Botox injection as migraine prevention, previously taking Topamax for many years, there was no reported seizure-like spells,  Her migraine eventually responded very well to [place] as needed, she tapered off Topamax few month ago, since May, she reported recurrent 2 episode of sudden  onset transient confusion, most recent episode was on January 18, 2021, while driving, she began to turn towards the left side, had transient confusion,  visual distortion, likely there was no incident, there was no seizure-like activity  We personally reviewed CT head in June 2009, right MCA infarction stable hemorrhagic component, right to left shift  UPDATE Apr 06 2021: She complains of increased headache over the past few months, likely attributed to the stress from holiday, and also weather changes, [name] continue to work, she takes about twice a week  She has no recurrent confusion spells, but did not titrating up lamotrigine dosage as discussed during last office visit,  EEG in November 2022 showed intermittent protrusion of slower wave dysrhythmic involving right frontotemporal region, no epileptiform discharge, but suggest focal irritability, consistent with her history of right MCA stroke  She also complains of mild memory loss, difficulty focusing, difficulty multitasking,  UPDATE  Apr 29 2021: She is overall doing well, tolerating titrating dose of lamotrigine, which has helped her mood, no longer has confusion spells, migraine headache overall is under good control, return for botulism toxin injection for spastic left upper and lower extremity    PHYSICAL EXAM   Vitals:   12/26/19 1427  BP: 140/84  Pulse: 92  Weight: 162 lb (73.5 kg)  Height: 5' 8.5" (1.74 m)   Body mass index is 24.27 kg/m.  Gen: NAD, conversant, well nourised, well groomed                     Cardiovascular: Regular rate rhythm, no peripheral edema, warm, nontender. Eyes: Conjunctivae clear without exudates or hemorrhage Neck: Supple, no carotid bruits. Pulmonary: Clear to auscultation bilaterally   NEUROLOGICAL EXAM:  MENTAL STATUS: Speech/Cognition: Awake, alert, normal speech, oriented to history taking and casual conversation.  CRANIAL NERVES: CN II: Visual fields are full to confrontation.  Pupils are round equal and briskly reactive to light. CN III, IV, VI: extraocular movement are normal. No ptosis. CN V: Facial sensation is intact  to light touch. CN VII: Face is symmetric with normal eye closure and smile. CN VIII: Hearing is normal to casual conversation CN IX, X: Palate elevates symmetrically. Phonation is normal. CN XI: Head turning and shoulder shrug are intact  MOTOR: Mild spastic left hemiparesis, tendency for left finger flexion, and left ankle plantarflexion  REFLEXES: Hyperreflexia on the left upper and lower extremity  SENSORY: Intact to light touch, pinprick, positional and vibratory sensation at fingers and toes.  COORDINATION: There is no trunk or limb ataxia.    GAIT/STANCE: Mildly unsteady gait, tendency for left ankle plantarflexion  Review of system: Pertinent as above   ALLERGIES: Allergies  Allergen Reactions   Penicillins Anaphylaxis and Hives   Imitrex [Sumatriptan]    Other Swelling    kiwi   Sulfa Antibiotics Hives   Zomig [Zolmitriptan]     HOME MEDICATIONS: Current Outpatient Medications  Medication Sig Dispense Refill   albuterol (PROVENTIL HFA) 108 (90 Base) MCG/ACT inhaler 1 to 2 inhalations 10-15 minutes apart every 4 hours if needed for asthma rescue 48 g 3   ALPRAZolam (XANAX) 0.5 MG tablet Take 1/2-1 tablet at Bedtime ONLY if needed for Sleep 30 tablet 0   amphetamine-dextroamphetamine (ADDERALL) 10 MG tablet Take 1/2 to 1 tablet 1 or 2 x daily for ADD 60 tablet 0   aspirin EC 81 MG tablet Take 81 mg by mouth daily.     augmented betamethasone dipropionate (DIPROLENE-AF) 0.05 % cream  Apply topically 2 (two) times daily as needed.     Botulinum Toxin Type A (BOTOX) 200 UNITS SOLR Inject 400 Units as directed every 3 (three) months. Change to BOTOX A 100  units     BREO ELLIPTA 100-25 MCG/INH AEPB 1 INHALATION DAILY TO PREVENT ASTHMA 180 each 3   buPROPion (WELLBUTRIN XL) 150 MG 24 hr tablet TAKE 1 TABLET EVERY MORNING FOR MOOD. FOCUS & CONCENTRATION 30 tablet 2   butalbital-acetaminophen-caffeine (FIORICET) 50-325-40 MG tablet Take 1 tablet by mouth daily as needed for  headache. Do not refill in less than 30 days, 12 tablet 5   cetirizine (ZYRTEC) 10 MG tablet Take 10 mg by mouth daily.     dexamethasone (DECADRON) 4 MG tablet Take 1 tab 3 x day - 3 days, then 2 x day - 3 days, then 1 tab daily 20 tablet 0   Diclofenac Potassium,Migraine, (CAMBIA) 50 MG PACK MIX 1 PACK WITH 1-2 OUNCES OF WATER AND DRINK AT ONSET OF MIGRAINE CAN REPEAT IN 2 HOURS IF NEEDED MAX 2/24 HOURS 8 each 11   diclofenac Sodium (VOLTAREN) 1 % GEL Apply 4 g topically 4 (four) times daily as needed. 500 g 6   Estradiol (YUVAFEM) 10 MCG TABS vaginal tablet Place 10 mcg vaginally 2 (two) times a week.     fenofibrate micronized (LOFIBRA) 134 MG capsule TAKE 1 CAPSULE EVERY DAY 90 capsule 3   levothyroxine (SYNTHROID) 88 MCG tablet Take 1 tablet daily on an empty stomach with only water for 30 minutes & no Antacid meds, Calcium or Magnesium for 4 hours & avoid Biotin 90 tablet 0   magnesium oxide (MAG-OX) 400 MG tablet Take 400 mg by mouth 2 (two) times daily.      montelukast (SINGULAIR) 10 MG tablet Take 1 tablet Daily  for Allergies. 90 tablet 3   Multiple Vitamin (MULTIVITAMIN WITH MINERALS) TABS tablet Take 1 tablet by mouth daily.     Multiple Vitamins-Minerals (ZINC PO) Take 1 tablet by mouth daily.     nortriptyline (PAMELOR) 25 MG capsule Take 25 mg by mouth at bedtime.     omeprazole (PRILOSEC) 40 MG capsule TAKE 1 CAPSULE DAILY FOR ACID INDIGESTION & REFLUX 90 capsule 3   ondansetron (ZOFRAN ODT) 8 MG disintegrating tablet Dissolve 1 tablet under tongue every 6 to 8 hours for nausea  or vomitting 30 tablet 0   Riboflavin 100 MG TABS Take 100 mg by mouth. bid     rosuvastatin (CRESTOR) 40 MG tablet Take 1 tablet (40 mg total) by mouth daily. Take 1 tablet Daily for Cholesterol 90 tablet 3   topiramate (TOPAMAX) 100 MG tablet Take 2 tablets (200 mg total) by mouth daily. 180 tablet 3   verapamil (CALAN-SR) 240 MG CR tablet TAKE 1 TABLET DAILY WITH FOOD FOR BP & MIGRAINE PREVENTION 90  tablet 3   vitamin B-12 (CYANOCOBALAMIN) 1000 MCG tablet Take 1,000 mcg by mouth daily.     Vitamin D, Ergocalciferol, (DRISDOL) 1.25 MG (50000 UT) CAPS capsule Take 1 capsule daily for Vitamin D deficiency. 90 capsule 1   Erenumab-aooe (AIMOVIG) 70 MG/ML SOAJ Inject 70 mg into the skin every 30 (thirty) days. 1 mL 11   Ubrogepant (UBRELVY) 50 MG TABS Take 50 mg by mouth as needed. May repeat once in 2 hours 12 tablet 11   No current facility-administered medications for this visit.    PAST MEDICAL HISTORY: Past Medical History:  Diagnosis Date   Anemia  Anxiety    Asthma    Chronic heartburn    Depression    Elevated cholesterol    on Crestor   Headache(784.0)    Hypertension    Hypothyroidism    S/P thyroidectomy   Migraine    Occlusion and stenosis of carotid artery with cerebral infarction    Spasm of muscle    Unspecified cerebral artery occlusion with cerebral infarction    Vitamin D deficiency     PAST SURGICAL HISTORY: Past Surgical History:  Procedure Laterality Date   AUGMENTATION MAMMAPLASTY Bilateral 2000   COSMETIC SURGERY     CYSTOCELE REPAIR N/A 01/08/2013   Procedure: ANTERIOR REPAIR (CYSTOCELE);  Surgeon: Meriel Pica, MD;  Location: [facility];  Service: Gynecology;  Laterality: N/A;   NASAL SINUS SURGERY     VAGINAL HYSTERECTOMY      FAMILY HISTORY: Family History  Problem Relation Age of Onset   Heart disease Mother    Peptic Ulcer Disease Mother    Heart disease Father    Parkinson's disease Father     SOCIAL HISTORY:  Social History   Socioeconomic History   Marital status: Married    Spouse name: Brett Canales   Number of children: 2   Years of education: college   Highest education level: Not on file  Occupational History   Occupation: DESIGNER    Employer: [employer]  Tobacco Use   Smoking status: Never Smoker   Smokeless tobacco: Never Used  Substance and Sexual Activity   Alcohol use: Yes    Comment: occassionally drinks a glass of  wine   Drug use: No   Sexual activity: Not on file  Other Topics Concern   Not on file  Social History Narrative   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 Patient is married and lives with her husband. Patient has college education.   Right handed.   Caffeine- two cups daily.   Social Determinants of Health   Financial Resource Strain:    Difficulty of Paying Living Expenses: Not on file  Food Insecurity:    Worried About Programme researcher, broadcasting/film/video in the Last Year: Not on file   [employer] of Food in the Last Year: Not on file  Transportation Needs:    Lack of Transportation (Medical): Not on file   Lack of Transportation (Non-Medical): Not on file  Physical Activity:    Days of Exercise per Week: Not on file   Minutes of Exercise per Session: Not on file  Stress:    Feeling of Stress : Not on file  Social Connections:    Frequency of Communication with Friends and Family: Not on file   Frequency of Social Gatherings with Friends and Family: Not on file   Attends Religious Services: Not on file   Active Member of Clubs or Organizations: Not on file   Attends Banker Meetings: Not on file   Marital Status: Not on file  Intimate Partner Violence:    Fear of Current or Ex-Partner: Not on file   Emotionally Abused: Not on file   Physically Abused: Not on file   Sexually Abused: Not on file      Levert Feinstein, M.D. Ph.D.  [facility] [address] [place], [place] [address] Ph: [phone] Fax: [phone]

## 2021-05-06 NOTE — Progress Notes (Signed)
FOLLOW UP 6 MONTH   Assessment and Plan:   Chronic migraine Managing well at this time, managed with Neurology and receiving Botox injections Q46months. Verapamil 240mg  daily   Spastic hemiplegia of left nondominant side as late effect of cerebrovascular disease, unspecified cerebrovascular disease type (HCC)/Questionable petit mal  Continue Botox injections, follow with Neurology, Dr for this.  Labile Hypertension - continue medications, DASH diet, exercise and monitor at home. Call if greater than 130/80.    Anxiety Doing well at this time Continue Alprazolam with benefit, but will cut down to 0.5 mg for her to try 1/2 as needed Will also cut back on wellbutrin from 300mg  to 150mg  for possible serotonin syndrome.  Stress management techniques discussed, increase water, good sleep hygiene discussed, increase exercise, and increase veggies.   Mixed hyperlipidemia Continue medications: rosuvastatin, fenofibrate- currently doing Rosuvastatin every other day, may need to go back to daily pending results Continue low cholesterol diet and exercise.  Check lipid panel.  -     Lipid panel - CMP  Hypothyroidism, unspecified type Taking levothyroxine 88 mcg daily Reminder to take on an empty stomach 30-26mins before first meal of the day. No antacid medications for 4 hours. -     TSH  ADD (attention deficit disorder) without hyperactivity Doing well on current regiment Continue with benefit -Adderall 10mg  1/2 to 1 tablet BID  Vitamin D deficiency monitor  Other abnormal glucose Recent A1Cs at goal Discussed diet/exercise, weight management  Defer A1C; check CMP  Medication management -     CBC with Differential/Platelet -     COMPLETE METABOLIC PANEL WITH GFR  Overweight,BMI 25.0-25.9 Discussed dietary and exercise modifications She is following a high protein, low fat, low carbs diet high in fruits and vegetables. Discuss exercise recommendations routinely Continue  to monitor weight at each visit  Mild intermittent asthma Has in date albuterol inhaler Doing well Using Breo daily Reminder to rinse mouth after use.    Continue diet and meds as discussed. Further disposition pending results of labs. Discussed med's effects and SE's.   Over 30 minutes of face to face interview, exam, counseling, chart review, and critical decision making was performed.   Future Appointments  Date Time Provider Department Center  07/23/2021  3:30 PM 70m, MD [facility] None  11/03/2021 11:00 AM [birth year]2005, MD [id number] None    ----------------------------------------------------------------------------------------------------------------------  HPI 61 y.o. female  presents for 3 month follow up on elevated blood pressures w/o diagnosis of HTN, HLD, Migraine, Hypothyroidism,  Pre-Diabetes and Vitamin D Deficiency.   In 2009, she had a Right MCA CVA attributed to a Right Carotid artery dissection from fibromuscular dysplasia and has residual of a mild spastic Left HP and mild cognitive dysfunction difficulty focusing/concentrating. These latter sx's are improved with routine Adderall.  Patient also follow with Dr 01/03/2022, Neurologist, for Bo-Tox injections for her spasticity and migraines.  Weather and situational stress trigger for migraines. Doing well with Ubrelvy EEG in 01/2022 some mild activity had a few petit mal seizures- started on Lamictal 100mg  BID has not noticed any further episodes since starting the medication.  Going to wean off Nortriptyline.  she has a diagnosis of anxiety and is currently on wellbutrin 150 mg and PRN xanax. Her anxiety has been worse, she is on several other serotonin agents. she reports currently takes xanax rarely, takes 1/2 tab just a few days per month but feels that it is too strong so does not want to take.  BMI is Body mass index is 25.7 kg/m., she has been working on diet and exercise. Doing new nutrition regimen.She is  working on exercise. Wt Readings from Last 3 Encounters:  05/07/21 174 lb (78.9 kg)  04/29/21 169 lb (76.7 kg)  04/06/21 169 lb (76.7 kg)   Her blood pressure has been controlled at home, today their BP is BP: 128/82 BP Readings from Last 3 Encounters:  05/07/21 128/82  04/29/21 (!) 134/91  04/06/21 (!) 152/80     She does not workout. She denies any cardiac symptoms, chest pains, palpitations, shortness of breath, dizziness or lower extremity edema.     She is on cholesterol medication Rosuvastatin 40 mg every other day( may need to return to daily), fenofibrate 134 mg daily and denies myalgias. Her cholesterol is at goal. The cholesterol last visit was:   Lab Results  Component Value Date   CHOL 177 11/03/2020   HDL 62 11/03/2020   LDLCALC 90 11/03/2020   TRIG 157 (H) 11/03/2020   CHOLHDL 2.9 11/03/2020    She has not been working on diet and exercise for prediabetes (5.8%, 2008), and denies hyperglycemia, hypoglycemia , nausea, polydipsia and polyuria. Last A1C in the office was:  Lab Results  Component Value Date   HGBA1C 5.5 11/03/2020   Patient has been on Thyroid Supression since Thyroid surgery for benign nodule and goiter in 2007.  She is currently taking levothyroxine 88 mcg daily.   Her medication was not changed last visit.   Lab Results  Component Value Date   TSH 1.40 11/03/2020   Patient is on Vitamin D supplement, has cut back from 50,000 once a week. Lab Results  Component Value Date   VD25OH 65 11/03/2020        Current Medications:   Current Outpatient Medications (Endocrine & Metabolic):    levothyroxine (SYNTHROID) 88 MCG tablet, TAKE 1 TABLET DAILY ON AN EMPTY STOMACH WITH ONLY WATER FOR 30 MINUTES & NO ANTACID MEDS, CALCIUM OR MAGNESIUM FOR 4 HOURS & AVOID BIOTIN   Current Outpatient Medications (Cardiovascular):    fenofibrate micronized (LOFIBRA) 134 MG capsule, TAKE 1 CAPSULE DAILY FOR TRIGLYCERIDES (BLOOD FATS)   rosuvastatin (CRESTOR) 40  MG tablet, Take  1 tablet  Daily  for Cholesterol   verapamil (CALAN-SR) 240 MG CR tablet, TAKE 1 TABLET DAILY WITH FOOD FOR BLOOD PRESSURE & MIGRAINE PREVENTION   Current Outpatient Medications (Respiratory):    albuterol (PROVENTIL HFA) 108 (90 Base) MCG/ACT inhaler, 1 to 2 inhalations 10-15 minutes apart every 4 hours if needed for asthma rescue   cetirizine (ZYRTEC) 10 MG tablet, Take 10 mg by mouth daily.   fluticasone furoate-vilanterol (BREO ELLIPTA) 100-25 MCG/ACT AEPB, Inhale 1 puff into the lungs daily. Rinse mouth with water after each use   montelukast (SINGULAIR) 10 MG tablet, TAKE 1 TABLET BY MOUTH AT BEDTIME FOR ALLERGIES   Current Outpatient Medications (Analgesics):    aspirin EC 81 MG tablet, Take 81 mg by mouth daily.   butalbital-acetaminophen-caffeine (FIORICET) 50-325-40 MG tablet, TAKE 1 TABLET BY MOUTH DAILY AS NEEDED FOR HEADACHE. DO NOT REFILL IN LESS THAN 30 DAYS,   UBRELVY 100 MG TABS, TAKE 1 TABLET BY MOUTH AS NEEDED.   Current Outpatient Medications (Hematological):    vitamin B-12 (CYANOCOBALAMIN) 1000 MCG tablet, Take 1,000 mcg by mouth daily.   Current Outpatient Medications (Other):    ALPRAZolam (XANAX) 0.5 MG tablet, TAKE 1/2-1 TABLET AT BEDTIME IF NEEDED FOR SLEEP&LIMIT TO 5 DAYS/WEEK TO AVOID  ADDICTION&DEMENTIA   botulinum toxin Type A (BOTOX) 100 units SOLR injection, Inject 300 Units into the muscle every 3 (three) months.   buPROPion (WELLBUTRIN XL) 150 MG 24 hr tablet, TAKE 1 TABLET EVERY MORNING FOR MOOD. FOCUS & CONCENTRATION   Estradiol 10 MCG TABS vaginal tablet, Place 10 mcg vaginally 2 (two) times a week.   lamoTRIgine (LAMICTAL) 100 MG tablet, Take 1 tablet (100 mg total) by mouth 2 (two) times daily.   lamoTRIgine (LAMICTAL) 25 MG tablet, 1 tab bid x one week 2 tab bid x 2nd week 3 tab bid x 3rd week   magnesium oxide (MAG-OX) 400 MG tablet, Take 400 mg by mouth 2 (two) times daily.    nortriptyline (PAMELOR) 25 MG capsule, TAKE 1 CAPSULE  BY MOUTH AT BEDTIME.   omeprazole (PRILOSEC) 40 MG capsule, Take  1 capsule  Daily  to Prevent Heartburn & Indigestion / Patient knows to take by mouth   ondansetron (ZOFRAN-ODT) 8 MG disintegrating tablet, DISSOLVE 1 TABLET UNDER TONGUE EVERY 6 TO 8 HOURS FOR NAUSEA OR VOMITTING   Riboflavin 100 MG TABS, Take 100 mg by mouth. bid   Vitamin D, Ergocalciferol, (DRISDOL) 1.25 MG (50000 UNIT) CAPS capsule, TAKE 1 CAPSULE DAILY FOR VITAMIN D DEFICIENCY.   zinc gluconate 50 MG tablet, Take 50 mg by mouth daily.   amphetamine-dextroamphetamine (ADDERALL) 10 MG tablet, Take  1/2 - 1 tablet  1 or 2 x /day  for ADD  Current Facility-Administered Medications (Other):    botulinum toxin Type A (BOTOX) injection 300 Units  Allergies:  Allergies  Allergen Reactions   Penicillins Anaphylaxis and Hives   Imitrex [Sumatriptan]    Other Swelling    kiwi   Sulfa Antibiotics Hives   Zomig [Zolmitriptan]      Medical History:  Past Medical History:  Diagnosis Date   Anemia    Anxiety    Asthma    Chronic heartburn    Depression    Elevated cholesterol    on Crestor   Headache(784.0)    Hypertension    Hypothyroidism    S/P thyroidectomy   Migraine    Occlusion and stenosis of carotid artery with cerebral infarction    Spasm of muscle    Unspecified cerebral artery occlusion with cerebral infarction    Vitamin D deficiency    Allergies Allergies  Allergen Reactions   Penicillins Anaphylaxis and Hives   Imitrex [Sumatriptan]    Other Swelling    kiwi   Sulfa Antibiotics Hives   Zomig [Zolmitriptan]     SURGICAL HISTORY She  has a past surgical history that includes Vaginal hysterectomy; Nasal sinus surgery; Cystocele repair (N/A, 01/08/2013); Cosmetic surgery; and Augmentation mammaplasty (Bilateral, 2000).   FAMILY HISTORY Her family history includes Heart disease in her father and mother; Parkinson's disease in her father; Peptic Ulcer Disease in her mother.   SOCIAL  HISTORY She  reports that she has never smoked. She has never used smokeless tobacco. She reports current alcohol use. She reports that she does not use drugs.   Screening Tests: Immunization History  Administered Date(s) Administered   Influenza Inj Mdck Quad With Preservative 01/20/2017, 01/09/2018, 01/10/2019, 01/25/2020   Influenza Split 01/24/2014   Influenza, Seasonal, Injecte, Preservative Fre 02/09/2016   Influenza,inj,Quad PF,6+ Mos 03/10/2021   Influenza-Unspecified 11/28/2011   PFIZER(Purple Top)SARS-COV-2 Vaccination 06/11/2019, 07/04/2019   PPD Test 04/04/2013, 04/22/2015, 05/18/2016, 06/20/2017, 07/27/2018, 11/03/2020   Pneumococcal Polysaccharide-23 03/29/1998   Td 03/29/2002   Tdap 05/18/2016  Review of Systems:  Review of Systems  Constitutional:  Negative for chills, fever and weight loss.  HENT:  Negative for congestion and hearing loss.   Eyes:  Negative for blurred vision and double vision.  Respiratory:  Negative for cough and shortness of breath.   Cardiovascular:  Negative for chest pain, palpitations, orthopnea and leg swelling.  Gastrointestinal:  Negative for abdominal pain, constipation, diarrhea, heartburn, nausea and vomiting.  Musculoskeletal:  Negative for falls, joint pain and myalgias.  Skin:  Negative for rash.  Neurological:  Negative for dizziness, tingling, tremors, loss of consciousness and headaches.  Psychiatric/Behavioral:  Negative for depression, memory loss and suicidal ideas.     Physical Exam: BP 128/82    Pulse 96    Temp (!) 97.5 F (36.4 C)    Wt 174 lb (78.9 kg)    SpO2 97%    BMI 25.70 kg/m  Wt Readings from Last 3 Encounters:  05/07/21 174 lb (78.9 kg)  04/29/21 169 lb (76.7 kg)  04/06/21 169 lb (76.7 kg)    General Appearance: Well nourished, in no apparent distress. Eyes: PERRLA, EOMs, conjunctiva no swelling or erythema Sinuses: No Frontal/maxillary tenderness ENT/Mouth: Ext aud canals clear, TMs without  erythema, bulging. No erythema, swelling, or exudate on post pharynx.  Tonsils not swollen or erythematous. Hearing normal.  Neck: Supple, thyroid normal.  Respiratory: Respiratory effort normal, BS equal bilaterally without rales, rhonchi, wheezing or stridor.  Cardio: RRR with no MRGs. Brisk peripheral pulses without edema.  Abdomen: Soft, + BS.  Non tender, no guarding, rebound, hernias, masses. Lymphatics: Non tender without lymphadenopathy.  Musculoskeletal: Full ROM, 5/5 strength right, 4/5 strength on left. normal gait, slow. Skin: Warm, dry without rashes, lesions, ecchymosis.  Neuro: Cranial nerves intact. No cerebellar symptoms.  Psych: Awake and oriented X 3, normal affect, Insight and Judgment appropriate.    Revonda HumphreyANA W Brianah Hopson, NP 12:37 PM [facility]

## 2021-05-07 ENCOUNTER — Other Ambulatory Visit: Payer: Self-pay

## 2021-05-07 ENCOUNTER — Encounter: Payer: Self-pay | Admitting: Nurse Practitioner

## 2021-05-07 ENCOUNTER — Ambulatory Visit (INDEPENDENT_AMBULATORY_CARE_PROVIDER_SITE_OTHER): Payer: Medicare Other | Admitting: Nurse Practitioner

## 2021-05-07 VITALS — BP 128/82 | HR 96 | Temp 97.5°F | Wt 174.0 lb

## 2021-05-07 DIAGNOSIS — E559 Vitamin D deficiency, unspecified: Secondary | ICD-10-CM

## 2021-05-07 DIAGNOSIS — J4520 Mild intermittent asthma, uncomplicated: Secondary | ICD-10-CM

## 2021-05-07 DIAGNOSIS — E039 Hypothyroidism, unspecified: Secondary | ICD-10-CM | POA: Diagnosis not present

## 2021-05-07 DIAGNOSIS — R0989 Other specified symptoms and signs involving the circulatory and respiratory systems: Secondary | ICD-10-CM

## 2021-05-07 DIAGNOSIS — I69954 Hemiplegia and hemiparesis following unspecified cerebrovascular disease affecting left non-dominant side: Secondary | ICD-10-CM | POA: Diagnosis not present

## 2021-05-07 DIAGNOSIS — Z79899 Other long term (current) drug therapy: Secondary | ICD-10-CM

## 2021-05-07 DIAGNOSIS — E663 Overweight: Secondary | ICD-10-CM

## 2021-05-07 DIAGNOSIS — E782 Mixed hyperlipidemia: Secondary | ICD-10-CM | POA: Diagnosis not present

## 2021-05-07 DIAGNOSIS — F988 Other specified behavioral and emotional disorders with onset usually occurring in childhood and adolescence: Secondary | ICD-10-CM

## 2021-05-07 DIAGNOSIS — N182 Chronic kidney disease, stage 2 (mild): Secondary | ICD-10-CM | POA: Diagnosis not present

## 2021-05-07 DIAGNOSIS — G43709 Chronic migraine without aura, not intractable, without status migrainosus: Secondary | ICD-10-CM | POA: Diagnosis not present

## 2021-05-07 DIAGNOSIS — R7309 Other abnormal glucose: Secondary | ICD-10-CM | POA: Diagnosis not present

## 2021-05-07 LAB — LIPID PANEL
Cholesterol: 212 mg/dL — ABNORMAL HIGH (ref <200)
HDL: 59 mg/dL (ref >=50)
LDL Cholesterol (Calc): 118 mg/dL (calc) — ABNORMAL HIGH
Non-HDL Cholesterol (Calc): 153 mg/dL (calc) — ABNORMAL HIGH (ref <130)
Total CHOL/HDL Ratio: 3.6 (calc) (ref <5.0)
Triglycerides: 248 mg/dL — ABNORMAL HIGH (ref <150)

## 2021-05-07 LAB — COMPLETE METABOLIC PANEL WITH GFR
AG Ratio: 1.9 (calc) (ref 1.0–2.5)
ALT: 34 U/L — ABNORMAL HIGH (ref 6–29)
AST: 22 U/L (ref 10–35)
Albumin: 4.7 g/dL (ref 3.6–5.1)
Alkaline phosphatase (APISO): 94 U/L (ref 37–153)
BUN: 19 mg/dL (ref 7–25)
CO2: 26 mmol/L (ref 20–32)
Calcium: 10 mg/dL (ref 8.6–10.4)
Chloride: 105 mmol/L (ref 98–110)
Creat: 1.03 mg/dL (ref 0.50–1.05)
Globulin: 2.5 g/dL (calc) (ref 1.9–3.7)
Glucose, Bld: 91 mg/dL (ref 65–99)
Potassium: 4.2 mmol/L (ref 3.5–5.3)
Sodium: 140 mmol/L (ref 135–146)
Total Bilirubin: 0.4 mg/dL (ref 0.2–1.2)
Total Protein: 7.2 g/dL (ref 6.1–8.1)
eGFR: 61 mL/min/{1.73_m2} (ref >=60)

## 2021-05-07 LAB — CBC WITH DIFFERENTIAL/PLATELET
Absolute Monocytes: 407 cells/uL (ref 200–950)
Basophils Absolute: 22 cells/uL (ref 0–200)
Basophils Relative: 0.4 %
Eosinophils Absolute: 149 cells/uL (ref 15–500)
Eosinophils Relative: 2.7 %
HCT: 47.3 % — ABNORMAL HIGH (ref 35.0–45.0)
Hemoglobin: 16.1 g/dL — ABNORMAL HIGH (ref 11.7–15.5)
Lymphs Abs: 1749 cells/uL (ref 850–3900)
MCH: 31.6 pg (ref 27.0–33.0)
MCHC: 34 g/dL (ref 32.0–36.0)
MCV: 92.9 fL (ref 80.0–100.0)
MPV: 11.5 fL (ref 7.5–12.5)
Monocytes Relative: 7.4 %
Neutro Abs: 3174 cells/uL (ref 1500–7800)
Neutrophils Relative %: 57.7 %
Platelets: 208 10*3/uL (ref 140–400)
RBC: 5.09 10*6/uL (ref 3.80–5.10)
RDW: 12.4 % (ref 11.0–15.0)
Total Lymphocyte: 31.8 %
WBC: 5.5 10*3/uL (ref 3.8–10.8)

## 2021-05-07 LAB — TSH: TSH: 1.45 mIU/L (ref 0.40–4.50)

## 2021-05-15 ENCOUNTER — Other Ambulatory Visit: Payer: Self-pay | Admitting: Internal Medicine

## 2021-05-15 DIAGNOSIS — E039 Hypothyroidism, unspecified: Secondary | ICD-10-CM

## 2021-06-06 ENCOUNTER — Other Ambulatory Visit: Payer: Self-pay | Admitting: Neurology

## 2021-06-08 NOTE — Telephone Encounter (Signed)
Rx pended for refill. ?Last appointment: 04/06/2021 ?Last filled: 10/22/2020 12 tablets with 4 refills ?

## 2021-06-09 ENCOUNTER — Other Ambulatory Visit: Payer: Self-pay | Admitting: Nurse Practitioner

## 2021-06-09 ENCOUNTER — Telehealth: Payer: Self-pay | Admitting: Nurse Practitioner

## 2021-06-09 DIAGNOSIS — F988 Other specified behavioral and emotional disorders with onset usually occurring in childhood and adolescence: Secondary | ICD-10-CM

## 2021-06-09 DIAGNOSIS — F419 Anxiety disorder, unspecified: Secondary | ICD-10-CM

## 2021-06-09 MED ORDER — AMPHETAMINE-DEXTROAMPHETAMINE 10 MG PO TABS: ORAL_TABLET | ORAL | 0 refills | Status: DC

## 2021-06-09 MED ORDER — ALPRAZOLAM 0.5 MG PO TABS: ORAL_TABLET | ORAL | 0 refills | Status: DC

## 2021-06-09 NOTE — Progress Notes (Signed)
PDMP reviewed for Adderall and Alprazolam refill ?

## 2021-06-09 NOTE — Telephone Encounter (Signed)
PT IS REQUESTINGA  REFILL OF Ruth Gregory AND Ruth Gregory PLEASE  ?

## 2021-06-10 ENCOUNTER — Ambulatory Visit (INDEPENDENT_AMBULATORY_CARE_PROVIDER_SITE_OTHER): Payer: Medicare Other | Admitting: Nurse Practitioner

## 2021-06-10 ENCOUNTER — Other Ambulatory Visit: Payer: Self-pay

## 2021-06-10 ENCOUNTER — Encounter: Payer: Self-pay | Admitting: Nurse Practitioner

## 2021-06-10 VITALS — BP 124/82 | HR 100 | Temp 98.0°F | Resp 18 | Ht 69.0 in | Wt 175.0 lb

## 2021-06-10 DIAGNOSIS — G4483 Primary cough headache: Secondary | ICD-10-CM | POA: Diagnosis not present

## 2021-06-10 DIAGNOSIS — R051 Acute cough: Secondary | ICD-10-CM | POA: Diagnosis not present

## 2021-06-10 DIAGNOSIS — J321 Chronic frontal sinusitis: Secondary | ICD-10-CM | POA: Diagnosis not present

## 2021-06-10 DIAGNOSIS — J209 Acute bronchitis, unspecified: Secondary | ICD-10-CM

## 2021-06-10 DIAGNOSIS — R5383 Other fatigue: Secondary | ICD-10-CM | POA: Diagnosis not present

## 2021-06-10 DIAGNOSIS — J029 Acute pharyngitis, unspecified: Secondary | ICD-10-CM | POA: Diagnosis not present

## 2021-06-10 MED ORDER — AZITHROMYCIN 250 MG PO TABS: ORAL_TABLET | ORAL | 0 refills | Status: DC

## 2021-06-10 MED ORDER — PREDNISONE 10 MG PO TABS: ORAL_TABLET | ORAL | 0 refills | Status: DC

## 2021-06-10 NOTE — Progress Notes (Signed)
Assessment and Plan: ? ?Ruth Gregory was seen today for a recurrent problem. ? ?Diagnoses and all orders for this visit: ? ?1. Chronic frontal sinusitis ?- azithromycin (ZITHROMAX Z-PAK) 250 MG tablet; Take 2 tablets (500 mg) on  Day 1,  followed by 1 tablet (250 mg) once daily on Days 2 through 5.  Dispense: 6 each; Refill: 0 ?- predniSONE (DELTASONE) 10 MG tablet; 1 po tidx 3days 1 po bid x 3days 1 po qd x 5days  Dispense: 20 tablet; Refill: 0 ? ? ?2. Acute cough ?Continue Albuterol PRN ?Continue Breo AS DIR. ?Continue to stay well hydrated.   ?OTC cough medication as directed.  ? ?3. Sore throat ?Gargle with warm salt water sever times throughout the day.   ?OTC Chloraseptic as directed as needed.  ? ?4. Other fatigue ?Rest.  Take breaks when needed. ? ?5. Primary cough headache ?OTC Tylenol or IBU as directed for acute HA. ?Fioricet as directed as needed for Migraine. ? ?Symptomatic therapy suggested: push fluids, rest, gargle warm salt water, use acetaminophen, ibuprofen, cough suppressant of choice prn, and return office visit prn if symptoms persist or worsen. Call or return to clinic prn if these symptoms worsen or fail to improve as anticipated.  ? ?Further disposition pending results of labs. Discussed med's effects and SE's.   ?Over 30 minutes of exam, counseling, chart review, and critical decision making was performed.  ? ?HPI: ?Ruth Gregory is a 61 y.o. female who complains of chest and nasal congestion, sore throat, HA, chills, and productive cough for 3-4 days. In home Covid test was NEGATIVE.  She reports a hx of Asthma and uses Breo and Ventolin.  She has not had to use Ventolin >2 x week.  Patient has never smoked cigarettes. ? ?Past Medical History:  ?Diagnosis Date  ? Anemia   ? Anxiety   ? Asthma   ? Chronic heartburn   ? Depression   ? Elevated cholesterol   ? on Crestor  ? Headache(784.0)   ? Hypertension   ? Hypothyroidism   ? S/P thyroidectomy  ? Migraine   ? Occlusion and stenosis of carotid  artery with cerebral infarction   ? Spasm of muscle   ? Unspecified cerebral artery occlusion with cerebral infarction   ? Vitamin D deficiency   ?  ? ?Allergies  ?Allergen Reactions  ? Penicillins Anaphylaxis and Hives  ? Imitrex [Sumatriptan]   ? Other Swelling  ?  kiwi  ? Sulfa Antibiotics Hives  ? Zomig [Zolmitriptan]   ?  ?Current Meds  ?Medication Sig  ? albuterol (PROVENTIL HFA) 108 (90 Base) MCG/ACT inhaler 1 to 2 inhalations 10-15 minutes apart every 4 hours if needed for asthma rescue  ? ALPRAZolam (XANAX) 0.5 MG tablet TAKE 1/2-1 TABLET AT BEDTIME IF NEEDED FOR SLEEP&LIMIT TO 5 DAYS/WEEK TO AVOID ADDICTION&DEMENTIA  ? amphetamine-dextroamphetamine (ADDERALL) 10 MG tablet Take  1/2 - 1 tablet  1 or 2 x /day  for ADD  ? aspirin EC 81 MG tablet Take 81 mg by mouth daily.  ? botulinum toxin Type A (BOTOX) 100 units SOLR injection Inject 300 Units into the muscle every 3 (three) months.  ? buPROPion (WELLBUTRIN XL) 150 MG 24 hr tablet TAKE 1 TABLET EVERY MORNING FOR MOOD. FOCUS & CONCENTRATION  ? butalbital-acetaminophen-caffeine (FIORICET) 50-325-40 MG tablet TAKE 1 TABLET BY MOUTH DAILY AS NEEDED FOR HEADACHE. DO NOT REFILL IN LESS THAN 30 DAYS,  ? cetirizine (ZYRTEC) 10 MG tablet Take 10 mg by mouth  daily.  ? Estradiol 10 MCG TABS vaginal tablet Place 10 mcg vaginally 2 (two) times a week.  ? fenofibrate micronized (LOFIBRA) 134 MG capsule TAKE 1 CAPSULE DAILY FOR TRIGLYCERIDES (BLOOD FATS)  ? fluticasone furoate-vilanterol (BREO ELLIPTA) 100-25 MCG/ACT AEPB Inhale 1 puff into the lungs daily. Rinse mouth with water after each use  ? lamoTRIgine (LAMICTAL) 100 MG tablet Take 1 tablet (100 mg total) by mouth 2 (two) times daily.  ? levothyroxine (SYNTHROID) 88 MCG tablet TAKE 1 TABLET DAILY ON AN EMPTY STOMACH WITH ONLY WATER FOR 30 MINS& NO ANTACID MEDS, CALCIUM, OR MAGNESIUM FOR 4 HOURS. AVOID BIOTIN  ? magnesium oxide (MAG-OX) 400 MG tablet Take 400 mg by mouth 2 (two) times daily.   ? montelukast  (SINGULAIR) 10 MG tablet TAKE 1 TABLET BY MOUTH AT BEDTIME FOR ALLERGIES  ? omeprazole (PRILOSEC) 40 MG capsule Take  1 capsule  Daily  to Prevent Heartburn & Indigestion / Patient knows to take by mouth  ? ondansetron (ZOFRAN-ODT) 8 MG disintegrating tablet DISSOLVE 1 TABLET UNDER TONGUE EVERY 6 TO 8 HOURS FOR NAUSEA OR VOMITTING  ? Riboflavin 100 MG TABS Take 100 mg by mouth. bid  ? rosuvastatin (CRESTOR) 40 MG tablet Take  1 tablet  Daily  for Cholesterol  ? UBRELVY 100 MG TABS TAKE 1 TABLET BY MOUTH AS NEEDED.  ? verapamil (CALAN-SR) 240 MG CR tablet TAKE 1 TABLET DAILY WITH FOOD FOR BLOOD PRESSURE & MIGRAINE PREVENTION  ? vitamin B-12 (CYANOCOBALAMIN) 1000 MCG tablet Take 1,000 mcg by mouth daily.  ? Vitamin D, Ergocalciferol, (DRISDOL) 1.25 MG (50000 UNIT) CAPS capsule TAKE 1 CAPSULE DAILY FOR VITAMIN D DEFICIENCY.  ? zinc gluconate 50 MG tablet Take 50 mg by mouth daily.  ? ?Current Facility-Administered Medications for the 06/10/21 encounter (Office Visit) with Ruth Laubscher, NP  ?Medication  ? botulinum toxin Type A (BOTOX) injection 300 Units  ?  ? ?ROS:  all negative except noted above in HPI. ? ?Physical Exam: ? ?BP 124/82   Pulse 100   Temp 98 ?F (36.7 ?C)   Resp 18   Ht 5' 9" (1.753 m)   Wt 175 lb (79.4 kg)   SpO2 94%   BMI 25.84 kg/m?   ? ?General Appearance: NAD.  Awake, conversant and cooperative. ?Eyes: PERRLA, EOMs intact.  Sclera white.  Conjunctiva without erythema. ?Sinuses: Tender frontal sinuses.  ?ENT/Mouth: Ext aud canals clear.  Bilateral TMs w/DOL and without erythema or bulging. Hearing intact.  Posterior pharynx without swelling or exudate. Tonsils without swelling or erythema.  ?Neck: Supple.  No masses, nodules or thyromegaly. ?Respiratory: Left posterior lobe with inspiratory wheezing.  Clears with cough.  Effort is regular with non-labored breathing.   ?Cardio: RRR with no MRGs. Brisk peripheral pulses without edema.  ?Abdomen: Active BS in all four quadrants.  Soft and  non-tender without guarding, rebound tenderness, hernias or masses. ?Lymphatics: Non tender without lymphadenopathy.  ?Musculoskeletal: Full ROM, 5/5 strength, normal ambulation.  No clubbing or cyanosis. ?Skin: Appropriate color for ethnicity. Warm without rashes, lesions, ecchymosis, ulcers.  ?Neuro: CN II-XII grossly normal. Normal muscle tone without cerebellar symptoms and intact sensation.   ?Psych: AO X 3,  appropriate mood and affect, insight and judgment.  ?  ?  ?

## 2021-06-11 ENCOUNTER — Ambulatory Visit: Payer: Medicare Other | Admitting: Adult Health

## 2021-06-11 ENCOUNTER — Encounter: Payer: Self-pay | Admitting: Nurse Practitioner

## 2021-06-11 NOTE — Progress Notes (Signed)
Assessment and Plan: ? ?[name] was seen today for a recurrent problem. ? ?Diagnoses and all orders for this visit: ? ?1. Chronic frontal sinusitis ?- azithromycin (ZITHROMAX Z-PAK) 250 MG tablet; Take 2 tablets (500 mg) on  Day 1,  followed by 1 tablet (250 mg) once daily on Days 2 through 5.  Dispense: 6 each; Refill: 0 ?- predniSONE (DELTASONE) 10 MG tablet; 1 po tidx 3days 1 po bid x 3days 1 po qd x 5days  Dispense: 20 tablet; Refill: 0 ? ? ?2. Acute cough ?Continue Albuterol PRN ?Continue Breo AS DIR. ?Continue to stay well hydrated.   ?OTC cough medication as directed.  ? ?3. Sore throat ?Gargle with warm salt water sever times throughout the day.   ?OTC Chloraseptic as directed as needed.  ? ?4. Other fatigue ?Rest.  Take breaks when needed. ? ?5. Primary cough headache ?OTC Tylenol or IBU as directed for acute HA. ?Fioricet as directed as needed for Migraine. ? ?Symptomatic therapy suggested: push fluids, rest, gargle warm salt water, use acetaminophen, ibuprofen, cough suppressant of choice prn, and return office visit prn if symptoms persist or worsen. Call or return to clinic prn if these symptoms worsen or fail to improve as anticipated.  ? ?Further disposition pending results of labs. Discussed med's effects and SE's.   ?Over 30 minutes of exam, counseling, chart review, and critical decision making was performed.  ? ?HPI: ?[name] is a 61 y.o. female who complains of chest and nasal congestion, sore throat, HA, chills, and productive cough for 3-4 days. In home Covid test was NEGATIVE.  She reports a hx of Asthma and uses Breo and Ventolin.  She has not had to use Ventolin >2 x week.  Patient has never smoked cigarettes. ? ?Past Medical History:  ?Diagnosis Date  ? Anemia   ? Anxiety   ? Asthma   ? Chronic heartburn   ? Depression   ? Elevated cholesterol   ? on Crestor  ? Headache(784.0)   ? Hypertension   ? Hypothyroidism   ? S/P thyroidectomy  ? Migraine   ? Occlusion and stenosis of carotid  artery with cerebral infarction   ? Spasm of muscle   ? Unspecified cerebral artery occlusion with cerebral infarction   ? Vitamin D deficiency   ?  ? ?Allergies  ?Allergen Reactions  ? Penicillins Anaphylaxis and Hives  ? Imitrex [Sumatriptan]   ? Other Swelling  ?  kiwi  ? Sulfa Antibiotics Hives  ? Zomig [Zolmitriptan]   ?  ?Current Meds  ?Medication Sig  ? albuterol (PROVENTIL HFA) 108 (90 Base) MCG/ACT inhaler 1 to 2 inhalations 10-15 minutes apart every 4 hours if needed for asthma rescue  ? ALPRAZolam (XANAX) 0.5 MG tablet TAKE 1/2-1 TABLET AT BEDTIME IF NEEDED FOR SLEEP&LIMIT TO 5 DAYS/WEEK TO AVOID ADDICTION&DEMENTIA  ? amphetamine-dextroamphetamine (ADDERALL) 10 MG tablet Take  1/2 - 1 tablet  1 or 2 x /day  for ADD  ? aspirin EC 81 MG tablet Take 81 mg by mouth daily.  ? botulinum toxin Type A (BOTOX) 100 units SOLR injection Inject 300 Units into the muscle every 3 (three) months.  ? buPROPion (WELLBUTRIN XL) 150 MG 24 hr tablet TAKE 1 TABLET EVERY MORNING FOR MOOD. FOCUS & CONCENTRATION  ? butalbital-acetaminophen-caffeine (FIORICET) 50-325-40 MG tablet TAKE 1 TABLET BY MOUTH DAILY AS NEEDED FOR HEADACHE. DO NOT REFILL IN LESS THAN 30 DAYS,  ? cetirizine (ZYRTEC) 10 MG tablet Take 10 mg by mouth  daily.  ? Estradiol 10 MCG TABS vaginal tablet Place 10 mcg vaginally 2 (two) times a week.  ? fenofibrate micronized (LOFIBRA) 134 MG capsule TAKE 1 CAPSULE DAILY FOR TRIGLYCERIDES (BLOOD FATS)  ? fluticasone furoate-vilanterol (BREO ELLIPTA) 100-25 MCG/ACT AEPB Inhale 1 puff into the lungs daily. Rinse mouth with water after each use  ? lamoTRIgine (LAMICTAL) 100 MG tablet Take 1 tablet (100 mg total) by mouth 2 (two) times daily.  ? levothyroxine (SYNTHROID) 88 MCG tablet TAKE 1 TABLET DAILY ON AN EMPTY STOMACH WITH ONLY WATER FOR 30 MINS& NO ANTACID MEDS, CALCIUM, OR MAGNESIUM FOR 4 HOURS. AVOID BIOTIN  ? magnesium oxide (MAG-OX) 400 MG tablet Take 400 mg by mouth 2 (two) times daily.   ? montelukast  (SINGULAIR) 10 MG tablet TAKE 1 TABLET BY MOUTH AT BEDTIME FOR ALLERGIES  ? omeprazole (PRILOSEC) 40 MG capsule Take  1 capsule  Daily  to Prevent Heartburn & Indigestion / Patient knows to take by mouth  ? ondansetron (ZOFRAN-ODT) 8 MG disintegrating tablet DISSOLVE 1 TABLET UNDER TONGUE EVERY 6 TO 8 HOURS FOR NAUSEA OR VOMITTING  ? Riboflavin 100 MG TABS Take 100 mg by mouth. bid  ? rosuvastatin (CRESTOR) 40 MG tablet Take  1 tablet  Daily  for Cholesterol  ? UBRELVY 100 MG TABS TAKE 1 TABLET BY MOUTH AS NEEDED.  ? verapamil (CALAN-SR) 240 MG CR tablet TAKE 1 TABLET DAILY WITH FOOD FOR BLOOD PRESSURE & MIGRAINE PREVENTION  ? vitamin B-12 (CYANOCOBALAMIN) 1000 MCG tablet Take 1,000 mcg by mouth daily.  ? Vitamin D, Ergocalciferol, (DRISDOL) 1.25 MG (50000 UNIT) CAPS capsule TAKE 1 CAPSULE DAILY FOR VITAMIN D DEFICIENCY.  ? zinc gluconate 50 MG tablet Take 50 mg by mouth daily.  ? ?Current Facility-Administered Medications for the 06/10/21 encounter (Office Visit) with Adela Glimpse, NP  ?Medication  ? botulinum toxin Type A (BOTOX) injection 300 Units  ?  ? ?ROS:  all negative except noted above in HPI. ? ?Physical Exam: ? ?BP 124/82   Pulse 100   Temp 98 ?F (36.7 ?C)   Resp 18   Ht 5\' 9"  (1.753 m)   Wt 175 lb (79.4 kg)   SpO2 94%   BMI 25.84 kg/m?   ? ?General Appearance: NAD.  Awake, conversant and cooperative. ?Eyes: PERRLA, EOMs intact.  Sclera white.  Conjunctiva without erythema. ?Sinuses: Tender frontal sinuses.  ?ENT/Mouth: Ext aud canals clear.  Bilateral TMs w/DOL and without erythema or bulging. Hearing intact.  Posterior pharynx without swelling or exudate. Tonsils without swelling or erythema.  ?Neck: Supple.  No masses, nodules or thyromegaly. ?Respiratory: Left posterior lobe with inspiratory wheezing.  Clears with cough.  Effort is regular with non-labored breathing.   ?Cardio: RRR with no MRGs. Brisk peripheral pulses without edema.  ?Abdomen: Active BS in all four quadrants.  Soft and  non-tender without guarding, rebound tenderness, hernias or masses. ?Lymphatics: Non tender without lymphadenopathy.  ?Musculoskeletal: Full ROM, 5/5 strength, normal ambulation.  No clubbing or cyanosis. ?Skin: Appropriate color for ethnicity. Warm without rashes, lesions, ecchymosis, ulcers.  ?Neuro: CN II-XII grossly normal. Normal muscle tone without cerebellar symptoms and intact sensation.   ?Psych: AO X 3,  appropriate mood and affect, insight and judgment.  ?  ?  ?

## 2021-06-12 ENCOUNTER — Other Ambulatory Visit: Payer: Self-pay | Admitting: Internal Medicine

## 2021-06-12 ENCOUNTER — Encounter: Payer: Self-pay | Admitting: Internal Medicine

## 2021-06-12 DIAGNOSIS — R059 Cough, unspecified: Secondary | ICD-10-CM | POA: Diagnosis not present

## 2021-06-12 DIAGNOSIS — J45998 Other asthma: Secondary | ICD-10-CM | POA: Diagnosis not present

## 2021-06-12 DIAGNOSIS — J40 Bronchitis, not specified as acute or chronic: Secondary | ICD-10-CM | POA: Diagnosis not present

## 2021-06-12 DIAGNOSIS — J4541 Moderate persistent asthma with (acute) exacerbation: Secondary | ICD-10-CM

## 2021-06-12 DIAGNOSIS — J208 Acute bronchitis due to other specified organisms: Secondary | ICD-10-CM | POA: Diagnosis not present

## 2021-06-12 MED ORDER — IPRATROPIUM-ALBUTEROL 0.5-2.5 (3) MG/3ML IN SOLN: 3.0000 mL | RESPIRATORY_TRACT | Status: AC

## 2021-06-12 MED ORDER — PREDNISONE 20 MG PO TABS: ORAL_TABLET | ORAL | 0 refills | Status: DC

## 2021-06-12 MED ORDER — LEVOFLOXACIN 500 MG PO TABS: ORAL_TABLET | ORAL | 1 refills | Status: DC

## 2021-06-18 ENCOUNTER — Other Ambulatory Visit: Payer: Self-pay | Admitting: Nurse Practitioner

## 2021-06-22 ENCOUNTER — Other Ambulatory Visit: Payer: Self-pay | Admitting: Internal Medicine

## 2021-07-13 DIAGNOSIS — R059 Cough, unspecified: Secondary | ICD-10-CM | POA: Diagnosis not present

## 2021-07-13 DIAGNOSIS — J208 Acute bronchitis due to other specified organisms: Secondary | ICD-10-CM | POA: Diagnosis not present

## 2021-07-13 DIAGNOSIS — J40 Bronchitis, not specified as acute or chronic: Secondary | ICD-10-CM | POA: Diagnosis not present

## 2021-07-13 DIAGNOSIS — J45998 Other asthma: Secondary | ICD-10-CM | POA: Diagnosis not present

## 2021-07-21 ENCOUNTER — Other Ambulatory Visit: Payer: Self-pay | Admitting: Nurse Practitioner

## 2021-07-21 DIAGNOSIS — F419 Anxiety disorder, unspecified: Secondary | ICD-10-CM

## 2021-07-22 MED ORDER — ALPRAZOLAM 0.5 MG PO TABS: ORAL_TABLET | ORAL | 0 refills | Status: DC

## 2021-07-23 ENCOUNTER — Ambulatory Visit: Payer: Medicare Other | Admitting: Neurology

## 2021-07-23 ENCOUNTER — Encounter: Payer: Self-pay | Admitting: Neurology

## 2021-07-23 VITALS — BP 135/90 | HR 99 | Ht 69.0 in | Wt 175.0 lb

## 2021-07-23 DIAGNOSIS — I69954 Hemiplegia and hemiparesis following unspecified cerebrovascular disease affecting left non-dominant side: Secondary | ICD-10-CM

## 2021-07-23 DIAGNOSIS — G43709 Chronic migraine without aura, not intractable, without status migrainosus: Secondary | ICD-10-CM | POA: Diagnosis not present

## 2021-07-23 MED ORDER — ONABOTULINUMTOXINA 100 UNITS IJ SOLR
300.0000 [IU] | Freq: Once | INTRAMUSCULAR | Status: AC
  Administered 2021-07-23: 300 [IU] via INTRAMUSCULAR

## 2021-07-23 NOTE — Progress Notes (Signed)
Botox 300 units  ?NDC- [phone] ?Lot- [id number] ?Exp- 2025/05 ?B/B ?

## 2021-07-23 NOTE — Progress Notes (Signed)
? ?ASSESSMENT AND PLAN ? ?Ruth Gregory is a 61 y.o. female  ? ?Chronic migraine headaches ? Continue preventive medications, nortriptyline 25 mg every night,  ? Ubrevyl 100 mg as needed. ? ?History of right internal carotid artery dissection, right MCA stroke in June 2009 ? ?Probable complex partial seizure ?EEG in November 2022 showed right frontotemporal focal slowing, ?Reported most recent episode was on January 18, 2021, no driving until seizure-free for 6 months ?She did not have similar episode while taking Topamax in the past (as migraine prevention) ?Doing much better with lamotrigine 100 mg twice daily, which also helped her mood ?  ? ?Spastic left hemiparesis following right internal carotid artery dissection, right MCA stroke in June 2000 ? Every 3 months for spastic right hemiparesis injection ? ?Under electric stimulation, injected Botox A 300 units ? ?Left pronator teres 25 units ?Left flexor digitorum profundus 25 units ?Left flexor digitorum superficialis 25 units ?Left palmaris longus 25 units ? ?Left levator scapular 25 units ?Left upper trapezius 25 units ?Left latissimus dorsi 50 units ? ?Left flexor digitorum longus 50 units ?Left tibialis posterior 50 units ? ?Rest of the 100 units was injected at bilateral temporalis muscles according to migraine protocol.  5 units at each injection site, ? ? ? ?DIAGNOSTIC DATA (LABS, IMAGING, TESTING) ?- I reviewed patient records, labs, notes, testing and imaging myself where available. ?HISTORICAL ? ?Ruth Gregory is a 60 year old right-handed female ? ?History of right internal carotid section with spastic left hemiparesis, Botox injection every 3 months  ?She has history of right internal carotid artery dissection following an episode of vomiting from migraine headache in mid June 2009. She was given IV TPA and found to have right MCA occlusion. She underwent emergent right carotid artery stent with distal endovascular recanalization of the  middle cerebral artery.  She had small hemorrhagic transformation of the right basal ganglia with mild left hemiparesis, but has done well since then and has been independent.  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 ? ?She has made marked recovery, ambulating only with very mild difficulty,  maintain majority of her left arm function,  there is mild limitation in the range of motion in her left shoulder,  mild left shoulder stiffness and pain, most bothersome symptoms is her left elbow discomfort, left wrist achy pain, difficulty releasing left hand flexion, left arm persistent flexion,  left ankle plantar inversion, increased gait difficulty after prolonged walking, This has all been helped by Botox injection. ? ?She has been receiving BOTOX injection since 01/2009 every 3 months, to her left upper extremity and also left lower extremity, responded well.   ?Repeat US carotid were normal in October 2013 ? ?Chronic migraine headaches: ?She had long-standing history of chronic migraine, average 2-4 headaches each months, she has increased frequency headaches around fall, and spring, she is now taking Topamax ER 150 mg every night as preventive medication, magnesium oxide 400 mg, riboflavin 100 mg twice a day, ? ?She is not candidate for triptan because previous history of stroke, previously [place] works well for her, she her insurance only allow her to have 9 tablets each months. Occasionally she take Fioricet, hydrocodone as needed, even prednisone 20 mg as needed as rescue therapy ? ?Sometimes she received Botox injection as migraine prevention, previously taking Topamax for many years, there was no reported seizure-like spells, ? ?Her migraine eventually responded very well to [place] as needed, she tapered off Topamax few month ago, since  May, she reported recurrent 2 episode of sudden onset transient confusion, most recent episode was on January 18, 2021, while driving, she began to turn towards  the left side, had transient confusion, visual distortion, likely there was no incident, there was no seizure-like activity ? ?We personally reviewed CT head in June 2009, right MCA infarction stable hemorrhagic component, right to left shift ? ?UPDATE Apr 06 2021: ?She complains of increased headache over the past few months, likely attributed to the stress from holiday, and also weather changes, [name] continue to work, she takes about twice a week ? ?She has no recurrent confusion spells, but did not titrating up lamotrigine dosage as discussed during last office visit, ? ?EEG in November 2022 showed intermittent protrusion of slower wave dysrhythmic involving right frontotemporal region, no epileptiform discharge, but suggest focal irritability, consistent with her history of right MCA stroke ? ?She also complains of mild memory loss, difficulty focusing, difficulty multitasking, ? ?UPDATE  Apr 29 2021: ?She is overall doing well, tolerating titrating dose of lamotrigine, which has helped her mood, no longer has confusion spells, migraine headache overall is under good control, return for botulism toxin injection for spastic left upper and lower extremity ? ? ?Update July 23, 2021: ?She tolerated lamotrigine 100 mg twice daily well, no recurrent seizure-like spells, also helped her mood, ?Migraine is overall under good control ? ? ?PHYSICAL EXAM ?  ?Vitals:  ? 12/26/19 1427  ?BP: 140/84  ?Pulse: 92  ?Weight: 162 lb (73.5 kg)  ?Height: 5' 8.5" (1.74 m)  ? ?Body mass index is 24.27 kg/m?. ? ?Gen: NAD, conversant, well nourised, well groomed                     ?Cardiovascular: Regular rate rhythm, no peripheral edema, warm, nontender. ?Eyes: Conjunctivae clear without exudates or hemorrhage ?Neck: Supple, no carotid bruits. ?Pulmonary: Clear to auscultation bilaterally  ? ?NEUROLOGICAL EXAM: ? ?MENTAL STATUS: ?Speech/Cognition: ?Awake, alert, normal speech, oriented to history taking and casual  conversation. ? ?CRANIAL NERVES: ?CN II: Visual fields are full to confrontation.  Pupils are round equal and briskly reactive to light. ?CN III, IV, VI: extraocular movement are normal. No ptosis. ?CN V: Facial sensation is intact to light touch. ?CN VII: Face is symmetric with normal eye closure and smile. ?CN VIII: Hearing is normal to casual conversation ?CN IX, X: Palate elevates symmetrically. Phonation is normal. ?CN XI: Head turning and shoulder shrug are intact ? ?MOTOR: Mild spastic left hemiparesis, tendency for left finger flexion, and left ankle plantarflexion ? ?COORDINATION: ?There is no trunk or limb ataxia.   ? ?GAIT/STANCE: Mildly unsteady gait, tendency for left ankle plantarflexion ? ?Review of system: Pertinent as above ?  ?ALLERGIES: ?Allergies  ?Allergen Reactions  ? Penicillins Anaphylaxis and Hives  ? Imitrex [Sumatriptan]   ? Other Swelling  ?  kiwi  ? Sulfa Antibiotics Hives  ? Zomig [Zolmitriptan]   ? ? ?HOME MEDICATIONS: ?Current Outpatient Medications  ?Medication Sig Dispense Refill  ? albuterol (PROVENTIL HFA) 108 (90 Base) MCG/ACT inhaler 1 to 2 inhalations 10-15 minutes apart every 4 hours if needed for asthma rescue 48 g 3  ? ALPRAZolam (XANAX) 0.5 MG tablet Take 1/2-1 tablet at Bedtime ONLY if needed for Sleep 30 tablet 0  ? amphetamine-dextroamphetamine (ADDERALL) 10 MG tablet Take 1/2 to 1 tablet 1 or 2 x daily for ADD 60 tablet 0  ? aspirin EC 81 MG tablet Take 81 mg by mouth daily.    ?  augmented betamethasone dipropionate (DIPROLENE-AF) 0.05 % cream Apply topically 2 (two) times daily as needed.    ? Botulinum Toxin Type A (BOTOX) 200 UNITS SOLR Inject 400 Units as directed every 3 (three) months. Change to BOTOX A 100  units    ? BREO ELLIPTA 100-25 MCG/INH AEPB 1 INHALATION DAILY TO PREVENT ASTHMA 180 each 3  ? buPROPion (WELLBUTRIN XL) 150 MG 24 hr tablet TAKE 1 TABLET EVERY MORNING FOR MOOD. FOCUS & CONCENTRATION 30 tablet 2  ? butalbital-acetaminophen-caffeine (FIORICET)  50-325-40 MG tablet Take 1 tablet by mouth daily as needed for headache. Do not refill in less than 30 days, 12 tablet 5  ? cetirizine (ZYRTEC) 10 MG tablet Take 10 mg by mouth daily.    ? dexamethasone (DECADRON) 4 MG ta

## 2021-07-29 ENCOUNTER — Ambulatory Visit: Payer: Medicare Other | Admitting: Neurology

## 2021-08-06 DIAGNOSIS — H5202 Hypermetropia, left eye: Secondary | ICD-10-CM | POA: Diagnosis not present

## 2021-08-11 ENCOUNTER — Encounter: Payer: Self-pay | Admitting: Orthopedic Surgery

## 2021-08-11 ENCOUNTER — Ambulatory Visit (INDEPENDENT_AMBULATORY_CARE_PROVIDER_SITE_OTHER): Payer: Medicare Other

## 2021-08-11 ENCOUNTER — Ambulatory Visit (INDEPENDENT_AMBULATORY_CARE_PROVIDER_SITE_OTHER): Payer: Medicare Other | Admitting: Orthopedic Surgery

## 2021-08-11 VITALS — Ht 69.0 in | Wt 175.0 lb

## 2021-08-11 DIAGNOSIS — M151 Heberden's nodes (with arthropathy): Secondary | ICD-10-CM | POA: Insufficient documentation

## 2021-08-11 DIAGNOSIS — M1811 Unilateral primary osteoarthritis of first carpometacarpal joint, right hand: Secondary | ICD-10-CM | POA: Diagnosis not present

## 2021-08-11 DIAGNOSIS — M79641 Pain in right hand: Secondary | ICD-10-CM

## 2021-08-11 MED ORDER — MELOXICAM 7.5 MG PO TABS: 7.5000 mg | ORAL_TABLET | Freq: Every day | ORAL | 0 refills | Status: AC

## 2021-08-11 NOTE — Progress Notes (Signed)
? ?Office Visit Note ?  ?Patient: Ruth Gregory           ?Date of Birth: 1961-02-17           ?MRN: [id number] ?Visit Date: 08/11/2021 ?             ?Requested by: Unk Pinto, MD ?[address] ?[address] ?[place],  [place] [address] ?PCP: Unk Pinto, MD ? ? ?Assessment & Plan: ?Visit Diagnoses:  ?1. Pain of right hand   ?2. Arthritis of carpometacarpal (CMC) joint of right thumb   ?3. Degenerative arthritis of distal interphalangeal joint of index finger of right hand   ?4. Degenerative arthritis of distal interphalangeal joint of middle finger of right hand   ?5. Degenerative arthritis of distal interphalangeal joint of ring finger of right hand   ? ? ?Plan: We reviewed today's x-ray of her right hand which demonstrate osteoarthritis of the thumb CMC joint and STT joint as well as the index, middle, and ring finger DIP joints.  We discussed the nature of arthritis and both conservative and surgical treatment options.  After discussion, she like to proceed with a prescription strength oral anti-inflammatory medication.  She is not interested in repeat corticosteroid injection today.  I can see her back again as needed. ? ?Follow-Up Instructions: No follow-ups on file.  ? ?Orders:  ?Orders Placed This Encounter  ?Procedures  ? XR Hand Complete Right  ? ?No orders of the defined types were placed in this encounter. ? ? ? ? Procedures: ?No procedures performed ? ? ?Clinical Data: ?No additional findings. ? ? ?Subjective: ?Chief Complaint  ?Patient presents with  ? Right Hand - Pain  ? ? ?Is a 61 year old right-hand-dominant female with a history of a prior stroke with left sided weakness who presents with pain in the right hand.  The pain is localized to the thumb CMC joint and index, middle, and ring finger DIP joints.  This been going on for some years now.  The pain at the base of the thumb seems to be her biggest problem.  Her pain is worse with activities that involve gripping or grasping.  Her  pain is worse with prolonged activity.  She describes the pain as dull and aching in nature.  She likens it to a toothache type of pain.  She takes Advil occasionally.  She had a previous corticosteroid injection in the base of her thumb with some symptom relief.  The pain at her DIP joints is mild in comparison to the thumb and is intermittent in nature. ? ? ?Review of Systems ? ? ?Objective: ?Vital Signs: Ht 5\' 9"  (1.753 m)   Wt 175 lb (79.4 kg)   BMI 25.84 kg/m?  ? ?Physical Exam ?Constitutional:   ?   Appearance: Normal appearance.  ?Cardiovascular:  ?   Rate and Rhythm: Normal rate.  ?   Pulses: Normal pulses.  ?Pulmonary:  ?   Effort: Pulmonary effort is normal.  ?Skin: ?   General: Skin is warm and dry.  ?   Capillary Refill: Capillary refill takes less than 2 seconds.  ?Neurological:  ?   Mental Status: She is alert.  ? ? ?Right Hand Exam  ? ?Tenderness  ?Right hand tenderness location: TTP at base of thumb at [facility] joint w/ mild to moderate swelling. ? ?Other  ?Erythema: absent ?Sensation: normal ?Pulse: present ? ?Comments:  Pain and crepitus with CMC grind test.  Static MP hyper-extension of approximately 30 degrees with no hyper-extension  with dynamic pinch.  No palmar thumb abduction contracture.  No pain w/ active or passive ROM of the finger DIP joints.  ? ? ? ? ?Specialty Comments:  ?No specialty comments available. ? ?Imaging: ?No results found. ? ? ?PMFS History: ?Patient Active Problem List  ? Diagnosis Date Noted  ? Arthritis of carpometacarpal [facility]) joint of right thumb 08/11/2021  ? Degenerative arthritis of distal interphalangeal joint of index finger of right hand 08/11/2021  ? Degenerative arthritis of distal interphalangeal joint of middle finger of right hand 08/11/2021  ? Degenerative arthritis of distal interphalangeal joint of ring finger of right hand 08/11/2021  ? Partial symptomatic epilepsy with complex partial seizures, not intractable, without status epilepticus ([place]) 01/21/2021   ? B12 deficiency 09/18/2020  ? Chronic migraine w/o aura w/o status migrainosus, not intractable 04/15/2020  ? Spastic hemiplegia of left nondominant side as late effect of cerebrovascular disease ([place]) 06/18/2019  ? Abnormal glucose 07/27/2018  ? Labile hypertension 07/27/2018  ? Fatty liver 10/09/2017  ? GERD (gastroesophageal reflux disease) 10/07/2017  ? Anxiety 10/07/2017  ? Hypothyroidism 07/23/2015  ? Vitamin D deficiency 10/03/2013  ? Medication management 10/03/2013  ? History of prediabetes 10/03/2013  ? ADD (attention deficit disorder) without hyperactivity 10/03/2013  ? Hyperlipidemia, mixed 04/04/2013  ? Sinusitis, chronic 04/04/2013  ? Chronic migraine 09/06/2012  ? Spastic hemiplegia affecting left nondominant side ([place]) 09/06/2012  ? History of CVA (cerebrovascular accident)   ? ?Past Medical History:  ?Diagnosis Date  ? Anemia   ? Anxiety   ? Asthma   ? Chronic heartburn   ? Depression   ? Elevated cholesterol   ? on Crestor  ? Headache(784.0)   ? Hypertension   ? Hypothyroidism   ? S/P thyroidectomy  ? Migraine   ? Occlusion and stenosis of carotid artery with cerebral infarction   ? Spasm of muscle   ? Unspecified cerebral artery occlusion with cerebral infarction   ? Vitamin D deficiency   ?  ?Family History  ?Problem Relation Age of Onset  ? Heart disease Mother   ? Peptic Ulcer Disease Mother   ? Heart disease Father   ? Parkinson's disease Father   ?  ?Past Surgical History:  ?Procedure Laterality Date  ? AUGMENTATION MAMMAPLASTY Bilateral 2000  ? COSMETIC SURGERY    ? CYSTOCELE REPAIR N/A 01/08/2013  ? Procedure: ANTERIOR REPAIR (CYSTOCELE);  Surgeon: Margarette Asal, MD;  Location: [facility];  Service: Gynecology;  Laterality: N/A;  ? NASAL SINUS SURGERY    ? VAGINAL HYSTERECTOMY    ? ?Social History  ? ?Occupational History  ? Occupation: DESIGNER  ?  Employer: [employer]  ?Tobacco Use  ? Smoking status: Never  ? Smokeless tobacco: Never  ?Substance and Sexual Activity  ? Alcohol use: Yes  ?   Comment: occassionally drinks a glass of wine  ? Drug use: No  ? Sexual activity: Not on file  ? ? ? ? ? ? ?

## 2021-08-12 DIAGNOSIS — J208 Acute bronchitis due to other specified organisms: Secondary | ICD-10-CM | POA: Diagnosis not present

## 2021-08-12 DIAGNOSIS — R059 Cough, unspecified: Secondary | ICD-10-CM | POA: Diagnosis not present

## 2021-08-12 DIAGNOSIS — J40 Bronchitis, not specified as acute or chronic: Secondary | ICD-10-CM | POA: Diagnosis not present

## 2021-08-12 DIAGNOSIS — J45998 Other asthma: Secondary | ICD-10-CM | POA: Diagnosis not present

## 2021-08-13 ENCOUNTER — Ambulatory Visit (INDEPENDENT_AMBULATORY_CARE_PROVIDER_SITE_OTHER): Payer: Medicare Other | Admitting: Adult Health

## 2021-08-13 ENCOUNTER — Encounter: Payer: Self-pay | Admitting: Adult Health

## 2021-08-13 VITALS — BP 134/96 | HR 100 | Temp 97.7°F | Wt 175.6 lb

## 2021-08-13 DIAGNOSIS — R350 Frequency of micturition: Secondary | ICD-10-CM

## 2021-08-13 NOTE — Progress Notes (Signed)
Assessment and Plan:  Ruth Gregory was seen today for acute visit.  Diagnoses and all orders for this visit:  Urinary frequency UTI versus OAB versus vaginal dryness - will check UA, C&S.  - avoid caffeine, artificial sweeteners - continue pushing water - resume regular topical vaginal estrogen -     Urinalysis, Routine w reflex microscopic -     Urine Culture  Further disposition pending results of labs. Discussed med's effects and SE's.   Over 15 minutes of exam, counseling, chart review, and critical decision making was performed.   Future Appointments  Date Time Provider Department Center  08/13/2021  2:30 PM Judd Gaudier, NP [facility] None  10/20/2021  3:30 PM Levert Feinstein, MD [facility] None  11/03/2021 11:00 AM Lucky Cowboy, MD [facility] None    ------------------------------------------------------------------------------------------------------------------   HPI BP (!) 134/96   Pulse 100   Temp 97.7 F (36.5 C)   Wt 175 lb 9.6 oz (79.7 kg)   SpO2 95%   BMI 25.93 kg/m  61 y.o.female with hx of bladder tack presents for evaluation of frequent urination, suprapubic tenderness on Sunday, has had ongoing urinary frequency.   She denies dysuria, hematuria, urine odor or cloudiness Has been pushing water, urine is clear On Sunday was going very frequently, recently about every 30 min  She denies vaginal discharge, perineal itching or burning.  She reports good BMs taking probiotic She reports hx of vaginal dryness that has led to UTIs, has been on topical estrogen, intermittently will forget  Past Medical History:  Diagnosis Date   Anemia    Anxiety    Asthma    Chronic heartburn    Depression    Elevated cholesterol    on Crestor   Headache(784.0)    Hypertension    Hypothyroidism    S/P thyroidectomy   Migraine    Occlusion and stenosis of carotid artery with cerebral infarction    Spasm of muscle    Unspecified cerebral artery occlusion with cerebral  infarction    Vitamin D deficiency      Allergies  Allergen Reactions   Penicillins Anaphylaxis and Hives   Imitrex [Sumatriptan]    Other Swelling    kiwi   Sulfa Antibiotics Hives   Zomig [Zolmitriptan]     Current Outpatient Medications on File Prior to Visit  Medication Sig   albuterol (PROVENTIL HFA) 108 (90 Base) MCG/ACT inhaler 1 to 2 inhalations 10-15 minutes apart every 4 hours if needed for asthma rescue   ALPRAZolam (XANAX) 0.5 MG tablet TAKE 1/2-1 TABLET AT BEDTIME IF NEEDED FOR SLEEP&LIMIT TO 5 DAYS/WEEK TO AVOID ADDICTION&DEMENTIA   botulinum toxin Type A (BOTOX) 100 units SOLR injection Inject 300 Units into the muscle every 3 (three) months.   buPROPion (WELLBUTRIN XL) 150 MG 24 hr tablet TAKE 1 TABLET EVERY MORNING FOR MOOD. FOCUS & CONCENTRATION   butalbital-acetaminophen-caffeine (FIORICET) 50-325-40 MG tablet TAKE 1 TABLET BY MOUTH DAILY AS NEEDED FOR HEADACHE. DO NOT REFILL IN LESS THAN 30 DAYS,   cetirizine (ZYRTEC) 10 MG tablet Take 10 mg by mouth daily.   Estradiol 10 MCG TABS vaginal tablet Place 10 mcg vaginally 2 (two) times a week.   fenofibrate micronized (LOFIBRA) 134 MG capsule TAKE 1 CAPSULE DAILY FOR TRIGLYCERIDES (BLOOD FATS)   fluticasone furoate-vilanterol (BREO ELLIPTA) 100-25 MCG/ACT AEPB Inhale 1 puff into the lungs daily. Rinse mouth with water after each use   lamoTRIgine (LAMICTAL) 100 MG tablet Take 1 tablet (100 mg total) by mouth 2 (two)  times daily.   levothyroxine (SYNTHROID) 88 MCG tablet TAKE 1 TABLET DAILY ON AN EMPTY STOMACH WITH ONLY WATER FOR 30 MINS& NO ANTACID MEDS, CALCIUM, OR MAGNESIUM FOR 4 HOURS. AVOID BIOTIN   magnesium oxide (MAG-OX) 400 MG tablet Take 400 mg by mouth 2 (two) times daily.    meloxicam (MOBIC) 7.5 MG tablet Take 1 tablet (7.5 mg total) by mouth daily.   montelukast (SINGULAIR) 10 MG tablet TAKE 1 TABLET BY MOUTH AT BEDTIME FOR ALLERGIES   omeprazole (PRILOSEC) 40 MG capsule Take  1 capsule  Daily  to Prevent  Heartburn & Indigestion / Patient knows to take by mouth   ondansetron (ZOFRAN-ODT) 8 MG disintegrating tablet DISSOLVE 1 TABLET UNDER TONGUE EVERY 6 TO 8 HOURS FOR NAUSEA OR VOMITTING   Riboflavin 100 MG TABS Take 100 mg by mouth. bid   rosuvastatin (CRESTOR) 40 MG tablet Take  1 tablet  Daily  for Cholesterol   UBRELVY 100 MG TABS TAKE 1 TABLET BY MOUTH AS NEEDED.   verapamil (CALAN-SR) 240 MG CR tablet TAKE 1 TABLET DAILY WITH FOOD FOR BLOOD PRESSURE & MIGRAINE PREVENTION   vitamin B-12 (CYANOCOBALAMIN) 1000 MCG tablet Take 1,000 mcg by mouth daily.   Vitamin D, Ergocalciferol, (DRISDOL) 1.25 MG (50000 UNIT) CAPS capsule Take 1 capsule three days a week for Vitamin D deficiency.   zinc gluconate 50 MG tablet Take 50 mg by mouth daily.   amphetamine-dextroamphetamine (ADDERALL) 10 MG tablet Take  1/2 - 1 tablet  1 or 2 x /day  for ADD   aspirin EC 81 MG tablet Take 81 mg by mouth daily. (Patient not taking: Reported on 08/13/2021)   nortriptyline (PAMELOR) 25 MG capsule TAKE 1 CAPSULE BY MOUTH AT BEDTIME. (Patient not taking: Reported on 06/10/2021)   No current facility-administered medications on file prior to visit.    ROS: all negative except above.   Physical Exam:  BP (!) 134/96   Pulse 100   Temp 97.7 F (36.5 C)   Wt 175 lb 9.6 oz (79.7 kg)   SpO2 95%   BMI 25.93 kg/m   General Appearance: Well nourished, non-toxic, in no apparent distress. Eyes: PERRLA, conjunctiva no swelling or erythema ENT/Mouth: Hearing normal.  Neck: Supple, Respiratory: Respiratory effort normal, BS equal bilaterally without rales, rhonchi, wheezing or stridor.  Cardio: RRR with no MRGs. Brisk peripheral pulses without edema.  Abdomen: Soft, + BS.  Non tender, no guarding, rebound, hernias, masses. No CVA tenderness Lymphatics: Non tender without lymphadenopathy.  Musculoskeletal: no obvious deformity; normal gait.  Skin: Warm, dry without rashes, lesions, ecchymosis.  Neuro: Normal muscle  tone Psych: Awake and oriented X 3, normal affect, Insight and Judgment appropriate.     Dan Maker, NP 2:28 PM [facility]

## 2021-08-14 ENCOUNTER — Encounter: Payer: Self-pay | Admitting: Adult Health

## 2021-08-14 LAB — URINALYSIS, ROUTINE W REFLEX MICROSCOPIC
Bilirubin Urine: NEGATIVE
Glucose, UA: NEGATIVE
Hgb urine dipstick: NEGATIVE
Ketones, ur: NEGATIVE
Leukocytes,Ua: NEGATIVE
Nitrite: NEGATIVE
Protein, ur: NEGATIVE
Specific Gravity, Urine: 1.018 (ref 1.001–1.035)
pH: 6.5 (ref 5.0–8.0)

## 2021-08-14 LAB — URINE CULTURE
MICRO NUMBER:: 13415596
Result:: NO GROWTH
SPECIMEN QUALITY:: ADEQUATE

## 2021-08-19 ENCOUNTER — Other Ambulatory Visit: Payer: Self-pay | Admitting: Adult Health

## 2021-08-19 ENCOUNTER — Telehealth: Payer: Self-pay | Admitting: Adult Health

## 2021-08-19 DIAGNOSIS — F988 Other specified behavioral and emotional disorders with onset usually occurring in childhood and adolescence: Secondary | ICD-10-CM

## 2021-08-19 MED ORDER — AMPHETAMINE-DEXTROAMPHETAMINE 10 MG PO TABS: ORAL_TABLET | ORAL | 0 refills | Status: DC

## 2021-08-19 NOTE — Progress Notes (Signed)
Future Appointments  Date Time Provider Department Center  10/20/2021  3:30 PM Levert Feinstein, MD [facility] None  11/03/2021 11:00 AM Lucky Cowboy, MD [facility] None   PDMP reviewed for adderall refill request.

## 2021-08-19 NOTE — Telephone Encounter (Signed)
Patient is requesting refill on Adderall to [facility] in [place]

## 2021-08-20 DIAGNOSIS — Z85828 Personal history of other malignant neoplasm of skin: Secondary | ICD-10-CM | POA: Diagnosis not present

## 2021-08-20 DIAGNOSIS — D0462 Carcinoma in situ of skin of left upper limb, including shoulder: Secondary | ICD-10-CM | POA: Diagnosis not present

## 2021-08-20 DIAGNOSIS — L821 Other seborrheic keratosis: Secondary | ICD-10-CM | POA: Diagnosis not present

## 2021-08-20 DIAGNOSIS — L814 Other melanin hyperpigmentation: Secondary | ICD-10-CM | POA: Diagnosis not present

## 2021-08-20 DIAGNOSIS — L570 Actinic keratosis: Secondary | ICD-10-CM | POA: Diagnosis not present

## 2021-08-26 ENCOUNTER — Other Ambulatory Visit: Payer: Self-pay | Admitting: Internal Medicine

## 2021-08-26 DIAGNOSIS — E559 Vitamin D deficiency, unspecified: Secondary | ICD-10-CM

## 2021-08-26 DIAGNOSIS — N182 Chronic kidney disease, stage 2 (mild): Secondary | ICD-10-CM

## 2021-08-26 DIAGNOSIS — R0989 Other specified symptoms and signs involving the circulatory and respiratory systems: Secondary | ICD-10-CM

## 2021-08-26 DIAGNOSIS — Z79899 Other long term (current) drug therapy: Secondary | ICD-10-CM

## 2021-08-27 ENCOUNTER — Other Ambulatory Visit: Payer: Medicare Other

## 2021-08-27 DIAGNOSIS — Z79899 Other long term (current) drug therapy: Secondary | ICD-10-CM | POA: Diagnosis not present

## 2021-08-27 DIAGNOSIS — R0989 Other specified symptoms and signs involving the circulatory and respiratory systems: Secondary | ICD-10-CM | POA: Diagnosis not present

## 2021-08-27 DIAGNOSIS — E559 Vitamin D deficiency, unspecified: Secondary | ICD-10-CM

## 2021-08-27 DIAGNOSIS — N182 Chronic kidney disease, stage 2 (mild): Secondary | ICD-10-CM | POA: Diagnosis not present

## 2021-08-28 LAB — CBC WITH DIFFERENTIAL/PLATELET
Absolute Monocytes: 302 cells/uL (ref 200–950)
Basophils Absolute: 32 cells/uL (ref 0–200)
Basophils Relative: 0.6 %
Eosinophils Absolute: 111 cells/uL (ref 15–500)
Eosinophils Relative: 2.1 %
HCT: 44.1 % (ref 35.0–45.0)
Hemoglobin: 15.5 g/dL (ref 11.7–15.5)
Lymphs Abs: 1685 cells/uL (ref 850–3900)
MCH: 32.8 pg (ref 27.0–33.0)
MCHC: 35.1 g/dL (ref 32.0–36.0)
MCV: 93.4 fL (ref 80.0–100.0)
MPV: 11.1 fL (ref 7.5–12.5)
Monocytes Relative: 5.7 %
Neutro Abs: 3169 cells/uL (ref 1500–7800)
Neutrophils Relative %: 59.8 %
Platelets: 204 10*3/uL (ref 140–400)
RBC: 4.72 10*6/uL (ref 3.80–5.10)
RDW: 12.5 % (ref 11.0–15.0)
Total Lymphocyte: 31.8 %
WBC: 5.3 10*3/uL (ref 3.8–10.8)

## 2021-08-28 LAB — VITAMIN D 25 HYDROXY (VIT D DEFICIENCY, FRACTURES): Vit D, 25-Hydroxy: 89 ng/mL (ref 30–100)

## 2021-08-28 LAB — COMPLETE METABOLIC PANEL WITH GFR
AG Ratio: 2.3 (calc) (ref 1.0–2.5)
ALT: 24 U/L (ref 6–29)
AST: 19 U/L (ref 10–35)
Albumin: 4.9 g/dL (ref 3.6–5.1)
Alkaline phosphatase (APISO): 70 U/L (ref 37–153)
BUN: 20 mg/dL (ref 7–25)
CO2: 27 mmol/L (ref 20–32)
Calcium: 10 mg/dL (ref 8.6–10.4)
Chloride: 102 mmol/L (ref 98–110)
Creat: 1.02 mg/dL (ref 0.50–1.05)
Globulin: 2.1 g/dL (calc) (ref 1.9–3.7)
Glucose, Bld: 95 mg/dL (ref 65–99)
Potassium: 4.2 mmol/L (ref 3.5–5.3)
Sodium: 140 mmol/L (ref 135–146)
Total Bilirubin: 0.5 mg/dL (ref 0.2–1.2)
Total Protein: 7 g/dL (ref 6.1–8.1)
eGFR: 62 mL/min/{1.73_m2} (ref >=60)

## 2021-08-28 LAB — MAGNESIUM: Magnesium: 2 mg/dL (ref 1.5–2.5)

## 2021-08-28 LAB — PTH, INTACT AND CALCIUM
Calcium: 10 mg/dL (ref 8.6–10.4)
PTH: 24 pg/mL (ref 16–77)

## 2021-08-28 NOTE — Progress Notes (Signed)
<><><><><><><><><><><><><><><><><><><><><><><><><><><><><><><><><> <><><><><><><><><><><><><><><><><><><><><><><><><><><><><><><><><> -   Test results slightly outside the reference range are not unusual. If there is anything important, I will review this with you,  otherwise it is considered normal test values.  If you have further questions,  please do not hesitate to contact me at the office or via My Chart.  <><><><><><><><><><><><><><><><><><><><><><><><><><><><><><><><><> <><><><><><><><><><><><><><><><><><><><><><><><><><><><><><><><><>  -  PTH is Hormone that regulates Calcium balance  & is Normal & OK <><><><><><><><><><><><><><><><><><><><><><><><><><><><><><><><><>  -  Vitamin D = 89  - Excellent - Please keep dose same   - Vitamin D goal is between 70-100.   - It is very important as a natural anti-inflammatory and helping the  immune system protect against viral infections, like the Covid-19   helping hair, skin, and nails, as well as reducing stroke and  heart attack risk.   - It helps your bones and helps with mood.  - It also decreases numerous cancer risks so please  take it as directed.   - Low Vit D is associated with a 200-300% higher risk for  CANCER   and 200-300% higher risk for HEART   ATTACK  &  STROKE.    - It is also associated with higher death rate at younger ages,   autoimmune diseases like Rheumatoid arthritis, Lupus,  Multiple Sclerosis.     - Also many other serious conditions, like depression, Alzheimer's  Dementia, infertility, muscle aches, fatigue, fibromyalgia   - just to name a few. <><><><><><><><><><><><><><><><><><><><><><><><><><><><><><><><><>  -  Kidney Functions - ( GFR) - are slightly decreased at 63  ( GFR was 74)  <><><><><><><><><><><><><><><><><><><><><><><><><><><><><><><><><>  -  All Else - CBC - Electrolytes - Liver - Magnesium   -  all  Normal /  OK  <><><><><><><><><><><><><><><><><><><><><><><><><><><><><><><><><> <><><><><><><><><><><><><><><><><><><><><><><><><><><><><><><><><>

## 2021-08-31 DIAGNOSIS — N182 Chronic kidney disease, stage 2 (mild): Secondary | ICD-10-CM | POA: Diagnosis not present

## 2021-08-31 DIAGNOSIS — R569 Unspecified convulsions: Secondary | ICD-10-CM | POA: Diagnosis not present

## 2021-08-31 DIAGNOSIS — I129 Hypertensive chronic kidney disease with stage 1 through stage 4 chronic kidney disease, or unspecified chronic kidney disease: Secondary | ICD-10-CM | POA: Diagnosis not present

## 2021-08-31 DIAGNOSIS — D631 Anemia in chronic kidney disease: Secondary | ICD-10-CM | POA: Diagnosis not present

## 2021-08-31 DIAGNOSIS — E559 Vitamin D deficiency, unspecified: Secondary | ICD-10-CM | POA: Diagnosis not present

## 2021-09-03 ENCOUNTER — Other Ambulatory Visit: Payer: Self-pay | Admitting: Internal Medicine

## 2021-09-11 ENCOUNTER — Encounter: Payer: Self-pay | Admitting: Adult Health

## 2021-09-11 DIAGNOSIS — D0462 Carcinoma in situ of skin of left upper limb, including shoulder: Secondary | ICD-10-CM | POA: Insufficient documentation

## 2021-09-12 DIAGNOSIS — J40 Bronchitis, not specified as acute or chronic: Secondary | ICD-10-CM | POA: Diagnosis not present

## 2021-09-12 DIAGNOSIS — R059 Cough, unspecified: Secondary | ICD-10-CM | POA: Diagnosis not present

## 2021-09-12 DIAGNOSIS — J45998 Other asthma: Secondary | ICD-10-CM | POA: Diagnosis not present

## 2021-09-12 DIAGNOSIS — J208 Acute bronchitis due to other specified organisms: Secondary | ICD-10-CM | POA: Diagnosis not present

## 2021-09-15 ENCOUNTER — Other Ambulatory Visit: Payer: Self-pay | Admitting: Nurse Practitioner

## 2021-09-15 DIAGNOSIS — F419 Anxiety disorder, unspecified: Secondary | ICD-10-CM

## 2021-09-22 ENCOUNTER — Ambulatory Visit (INDEPENDENT_AMBULATORY_CARE_PROVIDER_SITE_OTHER): Payer: Medicare Other | Admitting: Nurse Practitioner

## 2021-09-22 ENCOUNTER — Telehealth: Payer: Self-pay | Admitting: Neurology

## 2021-09-22 ENCOUNTER — Encounter: Payer: Self-pay | Admitting: Nurse Practitioner

## 2021-09-22 VITALS — BP 112/70 | HR 123 | Temp 97.5°F | Wt 167.6 lb

## 2021-09-22 DIAGNOSIS — R0781 Pleurodynia: Secondary | ICD-10-CM | POA: Diagnosis not present

## 2021-09-22 DIAGNOSIS — W19XXXA Unspecified fall, initial encounter: Secondary | ICD-10-CM

## 2021-09-22 DIAGNOSIS — N644 Mastodynia: Secondary | ICD-10-CM | POA: Diagnosis not present

## 2021-09-22 DIAGNOSIS — R071 Chest pain on breathing: Secondary | ICD-10-CM

## 2021-09-22 NOTE — Progress Notes (Signed)
Assessment and Plan:  Ruth Gregory was seen today for an episodic visit.  Diagnoses and all order for this visit:  1. Fall, initial encounter Patient has hx of CVA with ataxic gait.   2. Breast pain, right Discussed possible [place] if s/s fail to improve.  3. Rib pain on right side Rib X-ray for review and evaluation of any fracture. Apply Ice/Heat. Rest  4. Chest pain varying with breathing Any increase in SOB, DOE, hemoptysis call 911 or report to ER.  Notify office for further evaluation and treatment, questions or concerns if s/s fail to improve. The risks and benefits of my recommendations, as well as other treatment options were discussed with the patient today. Questions were answered.  Further disposition pending results of labs. Discussed med's effects and SE's.    Over 20 minutes of exam, counseling, chart review, and critical decision making was performed.   Future Appointments  Date Time Provider Department Center  10/20/2021  3:30 PM Levert Feinstein, MD [facility] None  11/03/2021 11:00 AM Lucky Cowboy, MD [facility] None    ------------------------------------------------------------------------------------------------------------------   HPI BP 112/70   Pulse (!) 123   Temp (!) 97.5 F (36.4 C)   Wt 167 lb 9.6 oz (76 kg)   SpO2 98%   BMI 24.75 kg/m   61 y.o.female with PMH of spastic hemiplegia of left nondominant side as late effect of cerebrovascular disease presents for evaluation of right breast pain after falling 1 week ago and landing on her right breast and right side ribs.  She has breast implants and reports soreness inside the breast.  She also report pain with inspiration.  She denies any bruising, swelling.    Past Medical History:  Diagnosis Date   Anemia    Anxiety    Asthma    Chronic heartburn    Depression    Elevated cholesterol    on Crestor   Headache(784.0)    Hypertension    Hypothyroidism    S/P thyroidectomy   Migraine     Occlusion and stenosis of carotid artery with cerebral infarction    Spasm of muscle    Unspecified cerebral artery occlusion with cerebral infarction    Vitamin D deficiency      Allergies  Allergen Reactions   Penicillins Anaphylaxis and Hives   Imitrex [Sumatriptan]    Other Swelling    kiwi   Sulfa Antibiotics Hives   Zomig [Zolmitriptan]     Current Outpatient Medications on File Prior to Visit  Medication Sig   albuterol (PROVENTIL HFA) 108 (90 Base) MCG/ACT inhaler 1 to 2 inhalations 10-15 minutes apart every 4 hours if needed for asthma rescue   ALPRAZolam (XANAX) 0.5 MG tablet TAKE 1/2-1 TABLET AT BEDTIME IF NEEDED FOR SLEEP&LIMIT TO 5 DAYS/WEEK TO AVOID ADDICTION&DEMENTIA   amphetamine-dextroamphetamine (ADDERALL) 10 MG tablet Take  1/2 - 1 tablet  1 or 2 x /day  for ADD   botulinum toxin Type A (BOTOX) 100 units SOLR injection Inject 300 Units into the muscle every 3 (three) months.   buPROPion (WELLBUTRIN XL) 150 MG 24 hr tablet TAKE 1 TABLET EVERY MORNING FOR MOOD. FOCUS & CONCENTRATION   butalbital-acetaminophen-caffeine (FIORICET) 50-325-40 MG tablet TAKE 1 TABLET BY MOUTH DAILY AS NEEDED FOR HEADACHE. DO NOT REFILL IN LESS THAN 30 DAYS,   cetirizine (ZYRTEC) 10 MG tablet Take 10 mg by mouth daily.   Estradiol 10 MCG TABS vaginal tablet Place 10 mcg vaginally 2 (two) times a week.  fenofibrate micronized (LOFIBRA) 134 MG capsule TAKE 1 CAPSULE DAILY FOR TRIGLYCERIDES (BLOOD FATS)   fluticasone furoate-vilanterol (BREO ELLIPTA) 100-25 MCG/ACT AEPB Inhale 1 puff into the lungs daily. Rinse mouth with water after each use   lamoTRIgine (LAMICTAL) 100 MG tablet Take 1 tablet (100 mg total) by mouth 2 (two) times daily.   levothyroxine (SYNTHROID) 88 MCG tablet TAKE 1 TABLET DAILY ON AN EMPTY STOMACH WITH ONLY WATER FOR 30 MINS& NO ANTACID MEDS, CALCIUM, OR MAGNESIUM FOR 4 HOURS. AVOID BIOTIN   magnesium oxide (MAG-OX) 400 MG tablet Take 400 mg by mouth 2 (two) times daily.     montelukast (SINGULAIR) 10 MG tablet TAKE 1 TABLET BY MOUTH AT BEDTIME FOR ALLERGIES   omeprazole (PRILOSEC) 40 MG capsule Take  1 capsule  Daily  to Prevent Heartburn & Indigestion / Patient knows to take by mouth   ondansetron (ZOFRAN-ODT) 8 MG disintegrating tablet DISSOLVE 1 TABLET UNDER TONGUE EVERY 6 TO 8 HOURS FOR NAUSEA OR VOMITTING   Riboflavin 100 MG TABS Take 100 mg by mouth. bid   rosuvastatin (CRESTOR) 40 MG tablet TAKE 1 TABLET BY MOUTH EVERY DAY FOR CHOLESTEROL   UBRELVY 100 MG TABS TAKE 1 TABLET BY MOUTH AS NEEDED.   verapamil (CALAN-SR) 240 MG CR tablet TAKE 1 TABLET DAILY WITH FOOD FOR BLOOD PRESSURE & MIGRAINE PREVENTION   vitamin B-12 (CYANOCOBALAMIN) 1000 MCG tablet Take 1,000 mcg by mouth daily.   Vitamin D, Ergocalciferol, (DRISDOL) 1.25 MG (50000 UNIT) CAPS capsule Take 1 capsule three days a week for Vitamin D deficiency.   zinc gluconate 50 MG tablet Take 50 mg by mouth daily.   No current facility-administered medications on file prior to visit.    ROS: all negative except what is noted in the HPI.    Physical Exam:  BP 112/70   Pulse (!) 123   Temp (!) 97.5 F (36.4 C)   Wt 167 lb 9.6 oz (76 kg)   SpO2 98%   BMI 24.75 kg/m   General Appearance: NAD.  Awake, conversant and cooperative. Eyes: PERRLA, EOMs intact.  Sclera white.  Conjunctiva without erythema. Sinuses: No frontal/maxillary tenderness.  No nasal discharge. Nares patent.  ENT/Mouth: Ext aud canals clear.  Bilateral TMs w/DOL and without erythema or bulging. Hearing intact.  Posterior pharynx without swelling or exudate.  Tonsils without swelling or erythema.  Neck: Supple.  No masses, nodules or thyromegaly. Respiratory: Tender to palpation along right upper chest and axilla region. Effort is regular with non-labored breathing. Breath sounds are equal bilaterally without rales, rhonchi, wheezing or stridor. Cardio: RRR with no MRGs. Brisk peripheral pulses without edema.  Abdomen: Active BS  in all four quadrants.  Soft and non-tender without guarding, rebound tenderness, hernias or masses. Lymphatics: Non tender without lymphadenopathy.  Musculoskeletal: Full ROM, 5/5 strength, normal ambulation.  No clubbing or cyanosis. Skin: Mild ecchymosis along the right areola area.   Neuro: CN II-XII grossly normal. Normal muscle tone without cerebellar symptoms and intact sensation.   Psych: AO X 3,  appropriate mood and affect, insight and judgment.     Adela Glimpse, NP 2:38 PM [facility]

## 2021-09-23 NOTE — Telephone Encounter (Signed)
I spoke with the patient. She stated Ubrelvy 100 MG tabs cost $300 for 10 tablets out-of-pocket due to her being in the donut hole with medicare. The retail cost of the medication is $574 without insurance. She did pick up rx.  [employer] patient assistance will not cover the requested medication since it is not denied by insurance.  The patient stated she will continue to get the medication if no coupon or patient assistance program is available. I am researching patient assistance options that the patient qualifies for and will provide the patient with an update. The patient is aware. WCB.

## 2021-09-23 NOTE — Telephone Encounter (Signed)
I spoke with the patient to inform her of a lower cost pharmacy ([facility] in [place], [place]) for [place]. Phone #: [phone]. I provided her with the information. She will give them a phone call to see if she qualifies. If so, she will update [place] so we can send a prescription to their pharmacy. She expressed appreciation for the call.

## 2021-09-24 ENCOUNTER — Encounter: Payer: Self-pay | Admitting: Nurse Practitioner

## 2021-09-24 ENCOUNTER — Ambulatory Visit (INDEPENDENT_AMBULATORY_CARE_PROVIDER_SITE_OTHER): Payer: Medicare Other | Admitting: Nurse Practitioner

## 2021-09-24 VITALS — BP 108/74 | HR 92 | Temp 97.5°F

## 2021-09-24 DIAGNOSIS — R0989 Other specified symptoms and signs involving the circulatory and respiratory systems: Secondary | ICD-10-CM | POA: Diagnosis not present

## 2021-09-24 DIAGNOSIS — R300 Dysuria: Secondary | ICD-10-CM | POA: Diagnosis not present

## 2021-09-24 MED ORDER — NITROFURANTOIN MONOHYD MACRO 100 MG PO CAPS: 100.0000 mg | ORAL_CAPSULE | Freq: Two times a day (BID) | ORAL | 0 refills | Status: AC

## 2021-09-24 NOTE — Progress Notes (Signed)
Assessment and Plan:  Harvest was seen today for hematuria.  Diagnoses and all orders for this visit:  Dysuria Push fluids.  Will start on Macrobid, if need to change antibiotic based on culture results will do so at that time -     Urinalysis, Routine w reflex microscopic -     Urine Culture -     nitrofurantoin, macrocrystal-monohydrate, (MACROBID) 100 MG capsule; Take 1 capsule (100 mg total) by mouth 2 (two) times daily for 7 days.  Labile hypertension - controlled without medications. Continue DASH diet, exercise and monitor at home. Call if greater than 130/80.        Further disposition pending results of labs. Discussed med's effects and SE's.   Over 30 minutes of exam, counseling, chart review, and critical decision making was performed.   Future Appointments  Date Time Provider Department Center  10/20/2021  3:30 PM Levert Feinstein, MD [facility] None  11/03/2021 11:00 AM Lucky Cowboy, MD [facility] None    ------------------------------------------------------------------------------------------------------------------   HPI BP 108/74   Pulse 92   Temp (!) 97.5 F (36.4 C)   SpO2 96%   61 y.o.female presents for  blood noted in urine after wiping.  She has noticed pain on urination and frequency. Has been present for 3 days.   She fell 2 weeks ago after tipping over a laundry basket. Was seen by Archie Patten NP and Xray was ordered but has not yet had it done  Continues to have discomfort in right rib area.   BP is currently well controlled without medication.  Denies headaches, chest pain, shortness of breath and dizziness.  BP Readings from Last 3 Encounters:  09/24/21 108/74  09/22/21 112/70  08/13/21 (!) 134/96     Past Medical History:  Diagnosis Date   Anemia    Anxiety    Asthma    Chronic heartburn    Depression    Elevated cholesterol    on Crestor   Headache(784.0)    Hypertension    Hypothyroidism    S/P thyroidectomy   Migraine    Occlusion and  stenosis of carotid artery with cerebral infarction    Spasm of muscle    Unspecified cerebral artery occlusion with cerebral infarction    Vitamin D deficiency      Allergies  Allergen Reactions   Penicillins Anaphylaxis and Hives   Imitrex [Sumatriptan]    Other Swelling    kiwi   Sulfa Antibiotics Hives   Zomig [Zolmitriptan]     Current Outpatient Medications on File Prior to Visit  Medication Sig   albuterol (PROVENTIL HFA) 108 (90 Base) MCG/ACT inhaler 1 to 2 inhalations 10-15 minutes apart every 4 hours if needed for asthma rescue   ALPRAZolam (XANAX) 0.5 MG tablet TAKE 1/2-1 TABLET AT BEDTIME IF NEEDED FOR SLEEP&LIMIT TO 5 DAYS/WEEK TO AVOID ADDICTION&DEMENTIA   amphetamine-dextroamphetamine (ADDERALL) 10 MG tablet Take  1/2 - 1 tablet  1 or 2 x /day  for ADD   botulinum toxin Type A (BOTOX) 100 units SOLR injection Inject 300 Units into the muscle every 3 (three) months.   buPROPion (WELLBUTRIN XL) 150 MG 24 hr tablet TAKE 1 TABLET EVERY MORNING FOR MOOD. FOCUS & CONCENTRATION   butalbital-acetaminophen-caffeine (FIORICET) 50-325-40 MG tablet TAKE 1 TABLET BY MOUTH DAILY AS NEEDED FOR HEADACHE. DO NOT REFILL IN LESS THAN 30 DAYS,   cetirizine (ZYRTEC) 10 MG tablet Take 10 mg by mouth daily.   Estradiol 10 MCG TABS vaginal tablet Place 10 mcg  vaginally 2 (two) times a week.   fenofibrate micronized (LOFIBRA) 134 MG capsule TAKE 1 CAPSULE DAILY FOR TRIGLYCERIDES (BLOOD FATS)   fluticasone furoate-vilanterol (BREO ELLIPTA) 100-25 MCG/ACT AEPB Inhale 1 puff into the lungs daily. Rinse mouth with water after each use   lamoTRIgine (LAMICTAL) 100 MG tablet Take 1 tablet (100 mg total) by mouth 2 (two) times daily.   levothyroxine (SYNTHROID) 88 MCG tablet TAKE 1 TABLET DAILY ON AN EMPTY STOMACH WITH ONLY WATER FOR 30 MINS& NO ANTACID MEDS, CALCIUM, OR MAGNESIUM FOR 4 HOURS. AVOID BIOTIN   magnesium oxide (MAG-OX) 400 MG tablet Take 400 mg by mouth 2 (two) times daily.    montelukast  (SINGULAIR) 10 MG tablet TAKE 1 TABLET BY MOUTH AT BEDTIME FOR ALLERGIES   omeprazole (PRILOSEC) 40 MG capsule Take  1 capsule  Daily  to Prevent Heartburn & Indigestion / Patient knows to take by mouth   ondansetron (ZOFRAN-ODT) 8 MG disintegrating tablet DISSOLVE 1 TABLET UNDER TONGUE EVERY 6 TO 8 HOURS FOR NAUSEA OR VOMITTING   OVER THE COUNTER MEDICATION Key E Suppositories   Riboflavin 100 MG TABS Take 100 mg by mouth. bid   rosuvastatin (CRESTOR) 40 MG tablet TAKE 1 TABLET BY MOUTH EVERY DAY FOR CHOLESTEROL   UBRELVY 100 MG TABS TAKE 1 TABLET BY MOUTH AS NEEDED.   verapamil (CALAN-SR) 240 MG CR tablet TAKE 1 TABLET DAILY WITH FOOD FOR BLOOD PRESSURE & MIGRAINE PREVENTION   vitamin B-12 (CYANOCOBALAMIN) 1000 MCG tablet Take 1,000 mcg by mouth daily.   Vitamin D, Ergocalciferol, (DRISDOL) 1.25 MG (50000 UNIT) CAPS capsule Take 1 capsule three days a week for Vitamin D deficiency.   zinc gluconate 50 MG tablet Take 50 mg by mouth daily.   No current facility-administered medications on file prior to visit.    ROS: all negative except above.   Physical Exam:  BP 108/74   Pulse 92   Temp (!) 97.5 F (36.4 C)   SpO2 96%   General Appearance: Well nourished, in no apparent distress. Eyes: PERRLA, EOMs, conjunctiva no swelling or erythema Sinuses: No Frontal/maxillary tenderness ENT/Mouth: Ext aud canals clear, TMs without erythema, bulging. No erythema, swelling, or exudate on post pharynx.  Tonsils not swollen or erythematous. Hearing normal.  Neck: Supple, thyroid normal.  Respiratory: Respiratory effort normal, BS equal bilaterally without rales, rhonchi, wheezing or stridor.  Cardio: RRR with no MRGs. Brisk peripheral pulses without edema.  Abdomen: Soft, + BS.  Non tender, no guarding, rebound, hernias, masses. Lymphatics: Non tender without lymphadenopathy.  Musculoskeletal: Full ROM, 5/5 strength, normal gait. No CVA tenderness Skin: Warm, dry without rashes, lesions,  ecchymosis.  Neuro: Cranial nerves intact. Normal muscle tone, no cerebellar symptoms. Sensation intact.  Psych: Awake and oriented X 3, normal affect, Insight and Judgment appropriate.     Raynelle Dick, NP 2:58 PM [facility]

## 2021-09-25 ENCOUNTER — Ambulatory Visit
Admission: RE | Admit: 2021-09-25 | Discharge: 2021-09-25 | Disposition: A | Discharge status: Home / Self Care | Payer: Medicare Other | Source: Ambulatory Visit | Attending: Nurse Practitioner | Admitting: Nurse Practitioner

## 2021-09-25 ENCOUNTER — Other Ambulatory Visit: Payer: Self-pay | Admitting: Nurse Practitioner

## 2021-09-25 DIAGNOSIS — R0781 Pleurodynia: Secondary | ICD-10-CM | POA: Diagnosis not present

## 2021-09-25 DIAGNOSIS — R071 Chest pain on breathing: Secondary | ICD-10-CM

## 2021-09-25 DIAGNOSIS — W19XXXA Unspecified fall, initial encounter: Secondary | ICD-10-CM

## 2021-09-25 DIAGNOSIS — N644 Mastodynia: Secondary | ICD-10-CM

## 2021-09-27 LAB — URINALYSIS, ROUTINE W REFLEX MICROSCOPIC
Bilirubin Urine: NEGATIVE
Glucose, UA: NEGATIVE
Hyaline Cast: NONE SEEN /LPF
Ketones, ur: NEGATIVE
Nitrite: POSITIVE — AB
Specific Gravity, Urine: 1.02 (ref 1.001–1.035)
WBC, UA: >=60 /HPF — AB (ref 0–5)
pH: 8 (ref 5.0–8.0)

## 2021-09-27 LAB — URINE CULTURE
MICRO NUMBER:: 13590248
SPECIMEN QUALITY:: ADEQUATE

## 2021-10-08 ENCOUNTER — Encounter: Payer: Self-pay | Admitting: Orthopaedic Surgery

## 2021-10-08 ENCOUNTER — Ambulatory Visit (INDEPENDENT_AMBULATORY_CARE_PROVIDER_SITE_OTHER): Payer: Medicare Other

## 2021-10-08 ENCOUNTER — Ambulatory Visit: Payer: Medicare Other | Admitting: Orthopaedic Surgery

## 2021-10-08 DIAGNOSIS — S92912A Unspecified fracture of left toe(s), initial encounter for closed fracture: Secondary | ICD-10-CM

## 2021-10-08 NOTE — Progress Notes (Signed)
Office Visit Note   Patient: Ruth Gregory           Date of Birth: Aug 02, 1960           MRN: [id number] Visit Date: 10/08/2021              Requested by: Lucky Cowboy, MD [address] [place],  [place] [address] PCP: Lucky Cowboy, MD   Assessment & Plan: Visit Diagnoses:  1. Closed displaced fracture of phalanx of toe of left foot, unspecified toe, initial encounter     Plan: Impression is left foot second toe fracture.  This should be amenable to nonoperative treatment.  We will place her in a postoperative shoe weightbearing as tolerated as well as buddy tape the second and third toes.  She will follow-up in 2 weeks time for repeat evaluation and x-rays of the left foot.  Call with concerns or questions.  Follow-Up Instructions: Return in about 2 weeks (around 10/22/2021).   Orders:  Orders Placed This Encounter  Procedures   XR Foot Complete Left   No orders of the defined types were placed in this encounter.     Procedures: No procedures performed   Clinical Data: No additional findings.   Subjective: Chief Complaint  Patient presents with   Left Foot - Injury    DOI 10/06/2021    HPI patient is a pleasant 61 year old female who comes in today following an injury to her left foot.  About 2 days ago, she dropped a heavy glass vase onto the dorsum of the left foot.  She has had pain in bruising since.  Pain is worse with walking.  She has taken meloxicam which does help.  Review of Systems as detailed in HPI.  All others reviewed and are negative.   Objective: Vital Signs: There were no vitals taken for this visit.  Physical Exam well-developed well-nourished female no acute distress.  Alert and oriented x3.  Ortho Exam examination of the left foot reveals moderate swelling and tenderness as well as ecchymosis to the second toe.  She does have ecchymosis to the third toe.  She is neurovascular intact distally.  Specialty Comments:   No specialty comments available.  Imaging: XR Foot Complete Left  Result Date: 10/08/2021 X-rays demonstrate a fracture to the proximal phalanx.    PMFS History: Patient Active Problem List   Diagnosis Date Noted   Squamous cell carcinoma in situ (SCCIS) of dorsum of left hand 09/11/2021   Arthritis of carpometacarpal [facility]) joint of right thumb 08/11/2021   Degenerative arthritis of distal interphalangeal joint of index finger of right hand 08/11/2021   Degenerative arthritis of distal interphalangeal joint of middle finger of right hand 08/11/2021   Degenerative arthritis of distal interphalangeal joint of ring finger of right hand 08/11/2021   Partial symptomatic epilepsy with complex partial seizures, not intractable, without status epilepticus (HCC) 01/21/2021   B12 deficiency 09/18/2020   Chronic migraine w/o aura w/o status migrainosus, not intractable 04/15/2020   Spastic hemiplegia of left nondominant side as late effect of cerebrovascular disease (HCC) 06/18/2019   Abnormal glucose 07/27/2018   Labile hypertension 07/27/2018   Fatty liver 10/09/2017   GERD (gastroesophageal reflux disease) 10/07/2017   Anxiety 10/07/2017   Hypothyroidism 07/23/2015   Vitamin D deficiency 10/03/2013   Medication management 10/03/2013   History of prediabetes 10/03/2013   ADD (attention deficit disorder) without hyperactivity 10/03/2013   Hyperlipidemia, mixed 04/04/2013   Sinusitis, chronic 04/04/2013   Chronic  migraine 09/06/2012   Spastic hemiplegia affecting left nondominant side (HCC) 09/06/2012   History of CVA (cerebrovascular accident)    Past Medical History:  Diagnosis Date   Anemia    Anxiety    Asthma    Chronic heartburn    Depression    Elevated cholesterol    on Crestor   Headache(784.0)    Hypertension    Hypothyroidism    S/P thyroidectomy   Migraine    Occlusion and stenosis of carotid artery with cerebral infarction    Spasm of muscle    Unspecified  cerebral artery occlusion with cerebral infarction    Vitamin D deficiency     Family History  Problem Relation Age of Onset   Heart disease Mother    Peptic Ulcer Disease Mother    Heart disease Father    Parkinson's disease Father     Past Surgical History:  Procedure Laterality Date   AUGMENTATION MAMMAPLASTY Bilateral 2000   COSMETIC SURGERY     CYSTOCELE REPAIR N/A 01/08/2013   Procedure: ANTERIOR REPAIR (CYSTOCELE);  Surgeon: Meriel Pica, MD;  Location: [facility];  Service: Gynecology;  Laterality: N/A;   NASAL SINUS SURGERY     VAGINAL HYSTERECTOMY     Social History   Occupational History   Occupation: DESIGNER    Employer: [employer]  Tobacco Use   Smoking status: Never   Smokeless tobacco: Never  Substance and Sexual Activity   Alcohol use: Yes    Comment: occassionally drinks a glass of wine   Drug use: No   Sexual activity: Not on file

## 2021-10-11 ENCOUNTER — Other Ambulatory Visit: Payer: Self-pay | Admitting: Adult Health

## 2021-10-12 DIAGNOSIS — J40 Bronchitis, not specified as acute or chronic: Secondary | ICD-10-CM | POA: Diagnosis not present

## 2021-10-12 DIAGNOSIS — R059 Cough, unspecified: Secondary | ICD-10-CM | POA: Diagnosis not present

## 2021-10-12 DIAGNOSIS — J45998 Other asthma: Secondary | ICD-10-CM | POA: Diagnosis not present

## 2021-10-12 DIAGNOSIS — J208 Acute bronchitis due to other specified organisms: Secondary | ICD-10-CM | POA: Diagnosis not present

## 2021-10-13 ENCOUNTER — Other Ambulatory Visit: Payer: Self-pay | Admitting: Nurse Practitioner

## 2021-10-13 ENCOUNTER — Telehealth: Payer: Self-pay | Admitting: Orthopaedic Surgery

## 2021-10-13 ENCOUNTER — Other Ambulatory Visit: Payer: Self-pay | Admitting: Adult Health

## 2021-10-13 ENCOUNTER — Encounter: Payer: Self-pay | Admitting: Internal Medicine

## 2021-10-13 DIAGNOSIS — F419 Anxiety disorder, unspecified: Secondary | ICD-10-CM

## 2021-10-13 DIAGNOSIS — F988 Other specified behavioral and emotional disorders with onset usually occurring in childhood and adolescence: Secondary | ICD-10-CM

## 2021-10-13 MED ORDER — AMPHETAMINE-DEXTROAMPHETAMINE 10 MG PO TABS: ORAL_TABLET | ORAL | 0 refills | Status: DC

## 2021-10-13 NOTE — Telephone Encounter (Signed)
Spoke with patient. Dr.Xu recommended icing, rest, and elevation. She is trying to do those things. She does state that her foot feels the best first thing in the morning before it swells. She has a new puppy and has been up more with it. She states that she gets Botox injections in her leg and she is scheduled for next Tuesday. Her neurologist may call for more info if needed.

## 2021-10-13 NOTE — Telephone Encounter (Signed)
Patient called stating that her toe next to her big to on the foot that has the boot on it is still hurting and she would like a call back with some tips on how to minimize the pain if possible. CB # [phone]

## 2021-10-14 MED ORDER — ALPRAZOLAM 0.5 MG PO TABS: ORAL_TABLET | ORAL | 0 refills | Status: DC

## 2021-10-20 ENCOUNTER — Ambulatory Visit (INDEPENDENT_AMBULATORY_CARE_PROVIDER_SITE_OTHER): Payer: Medicare Other | Admitting: Neurology

## 2021-10-20 DIAGNOSIS — I69954 Hemiplegia and hemiparesis following unspecified cerebrovascular disease affecting left non-dominant side: Secondary | ICD-10-CM | POA: Diagnosis not present

## 2021-10-20 DIAGNOSIS — G43709 Chronic migraine without aura, not intractable, without status migrainosus: Secondary | ICD-10-CM | POA: Diagnosis not present

## 2021-10-20 MED ORDER — ONABOTULINUMTOXINA 100 UNITS IJ SOLR: 300.0000 [IU] | Freq: Once | INTRAMUSCULAR | Status: DC

## 2021-10-20 NOTE — Progress Notes (Signed)
ASSESSMENT AND PLAN  Ruth Gregory is a 61 y.o. female   Chronic migraine headaches  Under good control  Continue preventive medications, nortriptyline 25 mg every night,   Ubrevyl 100 mg as needed.  History of right internal carotid artery dissection, right MCA stroke in June 2009  Probable complex partial seizure EEG in November 2022 showed right frontotemporal focal slowing, Reported most recent episode was on January 18, 2021, no driving until seizure-free for 6 months She did not have similar episode while taking Topamax in the past (as migraine prevention) Doing much better with lamotrigine 100 mg twice daily, which also helped her mood    Spastic left hemiparesis following right internal carotid artery dissection, right MCA stroke in June 2000   botulism toxin injection every 3 months for spastic right hemiparesis injection  Under electric stimulation, injected Botox A 300 units  Left pronator teres 25 units Left flexor digitorum profundus 25 units Left flexor digitorum superficialis 25 units Left palmaris longus 25 units  Left levator scapular 25 units Left upper trapezius 25 units Left iliocostalis 25 units Right levator scapula 25 units  Left flexor digitorum longus 50 units Left tibialis posterior 50 units   DIAGNOSTIC DATA (LABS, IMAGING, TESTING) - I reviewed patient records, labs, notes, testing and imaging myself where available. HISTORICAL  Ruth Gregory is a 61 year old right-handed female  History of right internal carotid section with spastic left hemiparesis, Botox injection every 3 months  She has history of right internal carotid artery dissection following an episode of vomiting from migraine headache in mid June 2009. She was given IV TPA and found to have right MCA occlusion. She underwent emergent right carotid artery stent with distal endovascular recanalization of the middle cerebral artery.  She had small hemorrhagic  transformation of the right basal ganglia with mild left hemiparesis, but has done well since then and has been independent.  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  She has been receiving BOTOX injection since 01/2009 every 3 months, to her left upper extremity and also left lower extremity, responded well.   Repeat US carotid were normal in October 2013  Chronic migraine headaches: She had long-standing history of chronic migraine, average 2-4 headaches each months, she has increased frequency headaches around fall, and spring, she is now taking Topamax ER 150 mg every night as preventive medication, magnesium oxide 400 mg, riboflavin 100 mg twice a day,  She is not candidate for triptan because previous history of stroke, previously Cambia works well for her, she her insurance only allow her to have 9 tablets each months. Occasionally she take Fioricet, hydrocodone as needed, even prednisone 20 mg as needed as rescue therapy  Sometimes she received Botox injection as migraine prevention, previously taking Topamax for many years, there was no reported seizure-like spells,  Her migraine eventually responded very well to Pontoosuc as needed, she tapered off Topamax few month ago, since May, she reported recurrent 2 episode of sudden onset transient confusion, most  recent episode was on January 18, 2021, while driving, she began to turn towards the left side, had transient confusion, visual  distortion, likely there was no incident, there was no seizure-like activity  We personally reviewed CT head in June 2009, right MCA infarction stable hemorrhagic component, right to left shift  UPDATE Apr 06 2021: She complains of increased headache over the past few months, likely attributed to the stress from holiday, and also weather changes, [name] continue to work, she takes about twice a week  She has no recurrent confusion spells, but did not titrating up lamotrigine dosage as discussed during last office visit,  EEG in November 2022 showed intermittent protrusion of slower wave dysrhythmic involving right frontotemporal region, no epileptiform discharge, but suggest focal irritability, consistent with her history of right MCA stroke  She also complains of mild memory loss, difficulty focusing, difficulty multitasking,  UPDATE  Apr 29 2021: She is overall doing well, tolerating titrating dose of lamotrigine, which has helped her mood, no longer has confusion spells, migraine headache overall is under good control, return for botulism toxin injection for spastic left upper and lower extremity   Update July 23, 2021: She tolerated lamotrigine 100 mg twice daily well, no recurrent seizure-like spells, also helped her mood, Migraine is overall under good control  UPDATE July 28th 2023: Heavy object fell on her left foot, broke her left toe, wearing left of boot,  Overall her headache is under good control, intermittent left hand muscle 7,'   PHYSICAL EXAM   Vitals:   12/26/19 1427  BP: 140/84  Pulse: 92  Weight: 162 lb (73.5 kg)  Height: 5' 8.5" (1.74 m)   Body mass index is 24.27 kg/m.  Gen: NAD, conversant, well nourised, well groomed                     Cardiovascular: Regular rate rhythm, no peripheral edema, warm, nontender. Eyes: Conjunctivae clear without exudates or hemorrhage Neck: Supple, no carotid bruits. Pulmonary: Clear to auscultation bilaterally    NEUROLOGICAL EXAM:  MENTAL STATUS: Speech/Cognition: Awake, alert, normal speech, oriented to history taking and casual conversation.  CRANIAL NERVES: CN II: Visual fields are full to confrontation.  Pupils are round equal and briskly reactive to light. CN III, IV, VI: extraocular movement are normal. No ptosis. CN V: Facial sensation is intact to light touch. CN VII: Face is symmetric with normal eye closure and smile. CN VIII: Hearing is normal to casual conversation CN IX, X: Palate elevates symmetrically. Phonation is normal. CN XI: Head turning and shoulder shrug are intact  MOTOR: Mild spastic left hemiparesis, tendency for left finger flexion, and left ankle plantarflexion  COORDINATION: There is no trunk or limb ataxia.    GAIT/STANCE: Mildly unsteady gait, tendency for left ankle plantarflexion  Review of system: Pertinent as above   ALLERGIES: Allergies  Allergen Reactions   Penicillins Anaphylaxis and Hives   Imitrex [Sumatriptan]    Other Swelling    kiwi   Sulfa Antibiotics Hives   Zomig [Zolmitriptan]     HOME MEDICATIONS: Current Outpatient Medications  Medication Sig Dispense Refill   albuterol (PROVENTIL HFA) 108 (90 Base) MCG/ACT inhaler 1 to 2 inhalations 10-15 minutes apart every 4 hours if needed for asthma rescue 48 g 3   ALPRAZolam (XANAX) 0.5 MG tablet Take 1/2-1 tablet at Bedtime ONLY if needed for Sleep 30 tablet 0   amphetamine-dextroamphetamine (ADDERALL) 10 MG tablet Take 1/2 to 1 tablet 1 or 2  x daily for ADD 60 tablet 0   aspirin EC 81 MG tablet Take 81 mg by mouth daily.     augmented betamethasone dipropionate (DIPROLENE-AF) 0.05 % cream Apply topically 2 (two) times daily as needed.     Botulinum Toxin Type A (BOTOX) 200 UNITS SOLR Inject 400 Units as directed every 3 (three) months. Change to BOTOX A 100  units     BREO ELLIPTA 100-25 MCG/INH AEPB 1 INHALATION DAILY TO PREVENT ASTHMA 180 each 3   buPROPion (WELLBUTRIN XL) 150 MG 24  hr tablet TAKE 1 TABLET EVERY MORNING FOR MOOD. FOCUS & CONCENTRATION 30 tablet 2   butalbital-acetaminophen-caffeine (FIORICET) 50-325-40 MG tablet Take 1 tablet by mouth daily as needed for headache. Do not refill in less than 30 days, 12 tablet 5   cetirizine (ZYRTEC) 10 MG tablet Take 10 mg by mouth daily.     dexamethasone (DECADRON) 4 MG tablet Take 1 tab 3 x day - 3 days, then 2 x day - 3 days, then 1 tab daily 20 tablet 0   Diclofenac Potassium,Migraine, (CAMBIA) 50 MG PACK MIX 1 PACK WITH 1-2 OUNCES OF WATER AND DRINK AT ONSET OF MIGRAINE CAN REPEAT IN 2 HOURS IF NEEDED MAX 2/24 HOURS 8 each 11   diclofenac Sodium (VOLTAREN) 1 % GEL Apply 4 g topically 4 (four) times daily as needed. 500 g 6   Estradiol (YUVAFEM) 10 MCG TABS vaginal tablet Place 10 mcg vaginally 2 (two) times a week.     fenofibrate micronized (LOFIBRA) 134 MG capsule TAKE 1 CAPSULE EVERY DAY 90 capsule 3   levothyroxine (SYNTHROID) 88 MCG tablet Take 1 tablet daily on an empty stomach with only water for 30 minutes & no Antacid meds, Calcium or Magnesium for 4 hours & avoid Biotin 90 tablet 0   magnesium oxide (MAG-OX) 400 MG tablet Take 400 mg by mouth 2 (two) times daily.      montelukast (SINGULAIR) 10 MG tablet Take 1 tablet Daily  for Allergies. 90 tablet 3   Multiple Vitamin (MULTIVITAMIN WITH MINERALS) TABS tablet Take 1 tablet by mouth daily.     Multiple Vitamins-Minerals (ZINC PO) Take 1 tablet by mouth daily.     nortriptyline (PAMELOR) 25 MG capsule Take 25 mg by mouth at bedtime.     omeprazole (PRILOSEC) 40 MG capsule TAKE 1 CAPSULE DAILY FOR ACID INDIGESTION & REFLUX 90 capsule 3   ondansetron (ZOFRAN ODT) 8 MG disintegrating tablet Dissolve 1 tablet under tongue every 6 to 8 hours for nausea  or vomitting 30 tablet 0   Riboflavin 100 MG TABS Take 100 mg by mouth. bid     rosuvastatin (CRESTOR) 40 MG tablet Take 1 tablet (40 mg total) by mouth daily. Take 1 tablet Daily for Cholesterol 90 tablet 3    topiramate (TOPAMAX) 100 MG tablet Take 2 tablets (200 mg total) by mouth daily. 180 tablet 3   verapamil (CALAN-SR) 240 MG CR tablet TAKE 1 TABLET DAILY WITH FOOD FOR BP & MIGRAINE PREVENTION 90 tablet 3   vitamin B-12 (CYANOCOBALAMIN) 1000 MCG tablet Take 1,000 mcg by mouth daily.     Vitamin D, Ergocalciferol, (DRISDOL) 1.25 MG (50000 UT) CAPS capsule Take 1 capsule daily for Vitamin D deficiency. 90 capsule 1   Erenumab-aooe (AIMOVIG) 70 MG/ML SOAJ Inject 70 mg into the skin every 30 (thirty) days. 1 mL 11   Ubrogepant (UBRELVY) 50 MG TABS Take 50 mg by mouth as needed. May repeat once in 2  hours 12 tablet 11   No current facility-administered medications for this visit.    PAST MEDICAL HISTORY: Past Medical History:  Diagnosis Date   Anemia    Anxiety    Asthma    Chronic heartburn    Depression    Elevated cholesterol    on Crestor   Headache(784.0)    Hypertension    Hypothyroidism    S/P thyroidectomy   Migraine    Occlusion and stenosis of carotid artery with cerebral infarction    Spasm of muscle    Unspecified cerebral artery occlusion with cerebral infarction    Vitamin D deficiency     PAST SURGICAL HISTORY: Past Surgical History:  Procedure Laterality Date   AUGMENTATION MAMMAPLASTY Bilateral 2000   COSMETIC SURGERY     CYSTOCELE REPAIR N/A 01/08/2013   Procedure: ANTERIOR REPAIR (CYSTOCELE);  Surgeon: Margarette Asal, MD;  Location: [facility];  Service: Gynecology;  Laterality: N/A;   NASAL SINUS SURGERY     VAGINAL HYSTERECTOMY      FAMILY HISTORY: Family History  Problem Relation Age of Onset   Heart disease Mother    Peptic Ulcer Disease Mother    Heart disease Father    Parkinson's disease Father     SOCIAL HISTORY:  Social History   Socioeconomic History   Marital status: Married    Spouse name: Richardson Landry   Number of children: 2   Years of education: college   Highest education level: Not on file  Occupational History   Occupation: DESIGNER     Employer: SELF  Tobacco Use   Smoking status: Never Smoker   Smokeless tobacco: Never Used  Substance and Sexual Activity   Alcohol use: Yes    Comment: occassionally drinks a glass of wine   Drug use: No   Sexual activity: Not on file  Other Topics Concern   Not on file  Social History Narrative   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 Patient is married and lives with her husband. Patient has college education.   Right handed.   Caffeine- two cups daily.   Social Determinants of Health   Financial Resource Strain:    Difficulty of Paying Living Expenses: Not on file  Food Insecurity:    Worried About Charity fundraiser in the Last Year: Not on file   [employer] of Food in the Last Year: Not on file  Transportation Needs:    Lack of Transportation (Medical): Not on file   Lack of Transportation (Non-Medical): Not on file  Physical Activity:    Days of Exercise per Week: Not on file   Minutes of Exercise per Session: Not on file  Stress:    Feeling of Stress : Not on file  Social Connections:    Frequency of Communication with Friends and Family: Not on file   Frequency of Social Gatherings with Friends and Family: Not on file   Attends Religious Services: Not on file   Active Member of Clubs or Organizations: Not on file   Attends Archivist Meetings: Not on file   Marital Status: Not on file  Intimate Partner Violence:    Fear of Current or Ex-Partner: Not on file   Emotionally Abused: Not on file   Physically Abused: Not on file   Sexually Abused: Not on file      Marcial Pacas, M.D. Ph.D.  [facility] [address] [place], [place] [address] Ph: [phone] Fax: [phone]  [phone]   

## 2021-10-20 NOTE — Progress Notes (Signed)
Botox 100 units x 3 vials  [id number] Exp-2025/11 B/B

## 2021-10-21 ENCOUNTER — Ambulatory Visit: Payer: Medicare Other | Admitting: Orthopaedic Surgery

## 2021-10-21 ENCOUNTER — Ambulatory Visit (INDEPENDENT_AMBULATORY_CARE_PROVIDER_SITE_OTHER): Payer: Medicare Other

## 2021-10-21 ENCOUNTER — Encounter: Payer: Self-pay | Admitting: Orthopaedic Surgery

## 2021-10-21 DIAGNOSIS — S92513A Displaced fracture of proximal phalanx of unspecified lesser toe(s), initial encounter for closed fracture: Secondary | ICD-10-CM | POA: Diagnosis not present

## 2021-10-21 DIAGNOSIS — S92512A Displaced fracture of proximal phalanx of left lesser toe(s), initial encounter for closed fracture: Secondary | ICD-10-CM

## 2021-10-21 MED ORDER — MELOXICAM 7.5 MG PO TABS: 7.5000 mg | ORAL_TABLET | Freq: Two times a day (BID) | ORAL | 6 refills | Status: DC | PRN

## 2021-10-21 NOTE — Progress Notes (Signed)
Office Visit Note   Patient: Ruth Gregory           Date of Birth: Jul 26, 1960           MRN: [id number] Visit Date: 10/21/2021              Requested by: Lucky Cowboy, MD [address] [place],  [place] [address] PCP: Lucky Cowboy, MD   Assessment & Plan: Visit Diagnoses:  1. Closed displaced fracture of proximal phalanx of lesser toe, initial encounter     Plan: Impression is stable second toe fracture.  Continue buddy taping and postop shoe for another month and then wean as tolerated.  Meloxicam refilled.  Recheck in 6 weeks with two-view x-rays of the second toe.  Follow-Up Instructions: Return in about 6 weeks (around 12/02/2021).   Orders:  Orders Placed This Encounter  Procedures   XR Foot Complete Left   Meds ordered this encounter  Medications   meloxicam (MOBIC) 7.5 MG tablet    Sig: Take 1 tablet (7.5 mg total) by mouth 2 (two) times daily as needed for pain.    Dispense:  30 tablet    Refill:  6      Procedures: No procedures performed   Clinical Data: No additional findings.   Subjective: Chief Complaint  Patient presents with   Left Foot - Follow-up    2nd toe fracture    HPI Gaelyn returns for follow-up of toe fracture.  Mainly complains of some swelling. Review of Systems   Objective: Vital Signs: There were no vitals taken for this visit.  Physical Exam  Ortho Exam Examination shows a mildly swollen second toe. Specialty Comments:  No specialty comments available.  Imaging: No results found.   PMFS History: Patient Active Problem List   Diagnosis Date Noted   Closed displaced fracture of proximal phalanx of lesser toe, initial encounter 10/21/2021   Squamous cell carcinoma in situ (SCCIS) of dorsum of left hand 09/11/2021   Arthritis of carpometacarpal [facility]) joint of right thumb 08/11/2021   Degenerative arthritis of distal interphalangeal joint of index finger of right hand 08/11/2021   Degenerative  arthritis of distal interphalangeal joint of middle finger of right hand 08/11/2021   Degenerative arthritis of distal interphalangeal joint of ring finger of right hand 08/11/2021   Partial symptomatic epilepsy with complex partial seizures, not intractable, without status epilepticus (HCC) 01/21/2021   B12 deficiency 09/18/2020   Chronic migraine w/o aura w/o status migrainosus, not intractable 04/15/2020   Spastic hemiplegia of left nondominant side as late effect of cerebrovascular disease (HCC) 06/18/2019   Abnormal glucose 07/27/2018   Labile hypertension 07/27/2018   Fatty liver 10/09/2017   GERD (gastroesophageal reflux disease) 10/07/2017   Anxiety 10/07/2017   Hypothyroidism 07/23/2015   Vitamin D deficiency 10/03/2013   Medication management 10/03/2013   History of prediabetes 10/03/2013   ADD (attention deficit disorder) without hyperactivity 10/03/2013   Hyperlipidemia, mixed 04/04/2013   Sinusitis, chronic 04/04/2013   Chronic migraine 09/06/2012   Spastic hemiplegia affecting left nondominant side (HCC) 09/06/2012   History of CVA (cerebrovascular accident)    Past Medical History:  Diagnosis Date   Anemia    Anxiety    Asthma    Chronic heartburn    Depression    Elevated cholesterol    on Crestor   Headache(784.0)    Hypertension    Hypothyroidism    S/P thyroidectomy   Migraine    Occlusion and stenosis of  carotid artery with cerebral infarction    Spasm of muscle    Unspecified cerebral artery occlusion with cerebral infarction    Vitamin D deficiency     Family History  Problem Relation Age of Onset   Heart disease Mother    Peptic Ulcer Disease Mother    Heart disease Father    Parkinson's disease Father     Past Surgical History:  Procedure Laterality Date   AUGMENTATION MAMMAPLASTY Bilateral 2000   COSMETIC SURGERY     CYSTOCELE REPAIR N/A 01/08/2013   Procedure: ANTERIOR REPAIR (CYSTOCELE);  Surgeon: Meriel Pica, MD;  Location: [facility];  Service: Gynecology;  Laterality: N/A;   NASAL SINUS SURGERY     VAGINAL HYSTERECTOMY     Social History   Occupational History   Occupation: DESIGNER    Employer: SELF  Tobacco Use   Smoking status: Never   Smokeless tobacco: Never  Substance and Sexual Activity   Alcohol use: Yes    Comment: occassionally drinks a glass of wine   Drug use: No   Sexual activity: Not on file

## 2021-10-22 ENCOUNTER — Ambulatory Visit: Payer: Medicare Other | Admitting: Orthopaedic Surgery

## 2021-10-23 DIAGNOSIS — G43709 Chronic migraine without aura, not intractable, without status migrainosus: Secondary | ICD-10-CM

## 2021-10-23 DIAGNOSIS — I69954 Hemiplegia and hemiparesis following unspecified cerebrovascular disease affecting left non-dominant side: Secondary | ICD-10-CM

## 2021-10-23 MED ORDER — ONABOTULINUMTOXINA 100 UNITS IJ SOLR
300.0000 [IU] | Freq: Once | INTRAMUSCULAR | Status: AC
  Administered 2021-10-23: 300 [IU] via INTRAMUSCULAR

## 2021-10-27 ENCOUNTER — Telehealth: Payer: Self-pay | Admitting: Nurse Practitioner

## 2021-10-27 ENCOUNTER — Other Ambulatory Visit: Payer: Self-pay | Admitting: Nurse Practitioner

## 2021-10-27 DIAGNOSIS — J4541 Moderate persistent asthma with (acute) exacerbation: Secondary | ICD-10-CM

## 2021-10-27 MED ORDER — ALBUTEROL SULFATE HFA 108 (90 BASE) MCG/ACT IN AERS: INHALATION_SPRAY | RESPIRATORY_TRACT | 3 refills | Status: AC

## 2021-10-27 NOTE — Telephone Encounter (Signed)
I sent the inhaler to [facility]

## 2021-10-27 NOTE — Telephone Encounter (Signed)
We prescribed an albuterol inhaler in the past for patient's asthma. She hasn't needed it in a long time but has been struggling with asthma flare ups lately. Requesting a [facility] rx [facility] in [place].

## 2021-10-28 ENCOUNTER — Encounter: Payer: Self-pay | Admitting: Internal Medicine

## 2021-11-02 ENCOUNTER — Encounter: Payer: Self-pay | Admitting: Internal Medicine

## 2021-11-02 NOTE — Patient Instructions (Signed)
Due to recent changes in healthcare laws, you may see the results of your imaging and laboratory studies on MyChart before your provider has had a chance to review them.  We understand that in some cases there may be results that are confusing or concerning to you. Not all laboratory results come back in the same time frame and the provider may be waiting for multiple results in order to interpret others.  Please give [place] 48 hours in order for your provider to thoroughly review all the results before contacting the office for clarification of your results.   ++++++++++++++++++++++++++++++  Vit D  & Vit C 1,000 mg   are recommended to help protect  against the Covid-19 and other Corona viruses.    Also it's recommended  to take  Zinc 50 mg  to help  protect against the Covid-19   and best place to get  is also on [email or web address]  and don't pay more than 6-8 cents /pill !  ================================ Coronavirus (COVID-19) Are you at risk?  Are you at risk for the Coronavirus (COVID-19)?  To be considered HIGH RISK for Coronavirus (COVID-19), you have to meet the following criteria:  Traveled to [place], [place], [place], [place] or [place]; or in the [place] to [place], [place], [place]  or [place]; and have fever, cough, and shortness of breath within the last 2 weeks of travel OR Been in close contact with a person diagnosed with COVID-19 within the last 2 weeks and have  fever, cough,and shortness of breath  IF YOU DO NOT MEET THESE CRITERIA, YOU ARE CONSIDERED LOW RISK FOR COVID-19.  What to do if you are HIGH RISK for COVID-19?  If you are having a medical emergency, call 911. Seek medical care right away. Before you go to a doctor's office, urgent care or emergency department,  call ahead and tell them about your recent travel, contact with someone diagnosed with COVID-19   and your symptoms.  You should receive instructions from your physician's office regarding  next steps of care.  When you arrive at healthcare provider, tell the healthcare staff immediately you have returned from  visiting [place], [place], [place], [place] or [place]; or traveled in the [place] to [place], [place],  [place] or [place] in the last two weeks or you have been in close contact with a person diagnosed with  COVID-19 in the last 2 weeks.   Tell the health care staff about your symptoms: fever, cough and shortness of breath. After you have been seen by a medical provider, you will be either: Tested for (COVID-19) and discharged home on quarantine except to seek medical care if  symptoms worsen, and asked to  Stay home and avoid contact with others until you get your results (4-5 days)  Avoid travel on public transportation if possible (such as bus, train, or airplane) or Sent to the Emergency Department by EMS for evaluation, COVID-19 testing  and  possible admission depending on your condition and test results.  What to do if you are LOW RISK for COVID-19?  Reduce your risk of any infection by using the same precautions used for avoiding the common cold or flu:  Wash your hands often with soap and warm water for at least 20 seconds.  If soap and water are not readily available,  use an alcohol-based hand sanitizer with at least 60% alcohol.  If coughing or sneezing, cover your mouth and nose by coughing  or sneezing into the elbow areas of your shirt or coat,  into a tissue or into your sleeve (not your hands). Avoid shaking hands with others and consider head nods or verbal greetings only. Avoid touching your eyes, nose, or mouth with unwashed hands.  Avoid close contact with people who are sick. Avoid places or events with large numbers of people in one location, like concerts or sporting events. Carefully consider travel plans you have or are making. If you are planning any travel outside or inside the [place], visit the CDC's Travelers' Health webpage for  the latest health notices. If you have some symptoms but not all symptoms, continue to monitor at home and seek medical attention  if your symptoms worsen. If you are having a medical emergency, call 911. >>>>>>>>>>>>>>>>>>>>>>> Preventive Care for Adults  A healthy lifestyle and preventive care can promote health and wellness. Preventive health guidelines for women include the following key practices. A routine yearly physical is a good way to check with your health care provider about your health and preventive screening. It is a chance to share any concerns and updates on your health and to receive a thorough exam. Visit your dentist for a routine exam and preventive care every 6 months. Brush your teeth twice a day and floss once a day. Good oral hygiene prevents tooth decay and gum disease. The frequency of eye exams is based on your age, health, family medical history, use of contact lenses, and other factors. Follow your health care provider's recommendations for frequency of eye exams. Eat a healthy diet. Foods like vegetables, fruits, whole grains, low-fat dairy products, and lean protein foods contain the nutrients you need without too many calories. Decrease your intake of foods high in solid fats, added sugars, and salt. Eat the right amount of calories for you. Get information about a proper diet from your health care provider, if necessary. Regular physical exercise is one of the most important things you can do for your health. Most adults should get at least 150 minutes of moderate-intensity exercise (any activity that increases your heart rate and causes you to sweat) each week. In addition, most adults need muscle-strengthening exercises on 2 or more days a week. Maintain a healthy weight. The body mass index (BMI) is a screening tool to identify possible weight problems. It provides an estimate of body fat based on height and weight. Your health care provider can find your BMI and can  help you achieve or maintain a healthy weight. For adults 20 years and older: A BMI below 18.5 is considered underweight. A BMI of 18.5 to 24.9 is normal. A BMI of 25 to 29.9 is considered overweight. A BMI of 30 and above is considered obese. Maintain normal blood lipids and cholesterol levels by exercising and minimizing your intake of saturated fat. Eat a balanced diet with plenty of fruit and vegetables. Blood tests for lipids and cholesterol should begin at age 43 and be repeated every 5 years. If your lipid or cholesterol levels are high, you are over 50, or you are at high risk for heart disease, you may need your cholesterol levels checked more frequently. Ongoing high lipid and cholesterol levels should be treated with medicines if diet and exercise are not working. If you smoke, find out from your health care provider how to quit. If you do not use tobacco, do not start. Lung cancer screening is recommended for adults aged 11-80 years who are at high risk for developing  lung cancer because of a history of smoking. A yearly low-dose CT scan of the lungs is recommended for people who have at least a 30-pack-year history of smoking and are a current smoker or have quit within the past 15 years. A pack year of smoking is smoking an average of 1 pack of cigarettes a day for 1 year (for example: 1 pack a day for 30 years or 2 packs a day for 15 years). Yearly screening should continue until the smoker has stopped smoking for at least 15 years. Yearly screening should be stopped for people who develop a health problem that would prevent them from having lung cancer treatment. High blood pressure causes heart disease and increases the risk of stroke. Your blood pressure should be checked at least every 1 to 2 years. Ongoing high blood pressure should be treated with medicines if weight loss and exercise do not work. If you are 32-3 years old, ask your health care provider if you should take aspirin to  prevent strokes. Diabetes screening involves taking a blood sample to check your fasting blood sugar level. This should be done once every 3 years, after age 32, if you are within normal weight and without risk factors for diabetes. Testing should be considered at a younger age or be carried out more frequently if you are overweight and have at least 1 risk factor for diabetes. Breast cancer screening is essential preventive care for women. You should practice "breast self-awareness." This means understanding the normal appearance and feel of your breasts and may include breast self-examination. Any changes detected, no matter how small, should be reported to a health care provider. Women in their 45s and 30s should have a clinical breast exam (CBE) by a health care provider as part of a regular health exam every 1 to 3 years. After age 83, women should have a CBE every year. Starting at age 92, women should consider having a mammogram (breast X-ray test) every year. Women who have a family history of breast cancer should talk to their health care provider about genetic screening. Women at a high risk of breast cancer should talk to their health care providers about having an MRI and a mammogram every year. Breast cancer gene (BRCA)-related cancer risk assessment is recommended for women who have family members with BRCA-related cancers. BRCA-related cancers include breast, ovarian, tubal, and peritoneal cancers. Having family members with these cancers may be associated with an increased risk for harmful changes (mutations) in the breast cancer genes BRCA1 and BRCA2. Results of the assessment will determine the need for genetic counseling and BRCA1 and BRCA2 testing. Routine pelvic exams to screen for cancer are no longer recommended for nonpregnant women who are considered low risk for cancer of the pelvic organs (ovaries, uterus, and vagina) and who do not have symptoms. Ask your health care provider if a  screening pelvic exam is right for you. If you have had past treatment for cervical cancer or a condition that could lead to cancer, you need Pap tests and screening for cancer for at least 20 years after your treatment. If Pap tests have been discontinued, your risk factors (such as having a new sexual partner) need to be reassessed to determine if screening should be resumed. Some women have medical problems that increase the chance of getting cervical cancer. In these cases, your health care provider may recommend more frequent screening and Pap tests. Colorectal cancer can be detected and often prevented. Most routine colorectal  cancer screening begins at the age of 10 years and continues through age 19 years. However, your health care provider may recommend screening at an earlier age if you have risk factors for colon cancer. On a yearly basis, your health care provider may provide home test kits to check for hidden blood in the stool. Use of a small camera at the end of a tube, to directly examine the colon (sigmoidoscopy or colonoscopy), can detect the earliest forms of colorectal cancer. Talk to your health care provider about this at age 28, when routine screening begins.  Direct exam of the colon should be repeated every 5-10 years through age 71 years, unless early forms of pre-cancerous polyps or small growths are found. Hepatitis C blood testing is recommended for all people born from [birth year] through 1965 and any individual with known risks for hepatitis C.  Osteoporosis is a disease in which the bones lose minerals and strength with aging. This can result in serious bone fractures or breaks. The risk of osteoporosis can be identified using a bone density scan. Women ages 21 years and over and women at risk for fractures or osteoporosis should discuss screening with their health care providers. Ask your health care provider whether you should take a calcium supplement or vitamin D to reduce the rate  of osteoporosis. Menopause can be associated with physical symptoms and risks. Hormone replacement therapy is available to decrease symptoms and risks. You should talk to your health care provider about whether hormone replacement therapy is right for you. Use sunscreen. Apply sunscreen liberally and repeatedly throughout the day. You should seek shade when your shadow is shorter than you. Protect yourself by wearing long sleeves, pants, a wide-brimmed hat, and sunglasses year round, whenever you are outdoors. Once a month, do a whole body skin exam, using a mirror to look at the skin on your back. Tell your health care provider of new moles, moles that have irregular borders, moles that are larger than a pencil eraser, or moles that have changed in shape or color. Stay current with required vaccines (immunizations). Influenza vaccine. All adults should be immunized every year. Tetanus, diphtheria, and acellular pertussis (Td, Tdap) vaccine. Pregnant women should receive 1 dose of Tdap vaccine during each pregnancy. The dose should be obtained regardless of the length of time since the last dose. Immunization is preferred during the 27th-36th week of gestation. An adult who has not previously received Tdap or who does not know her vaccine status should receive 1 dose of Tdap. This initial dose should be followed by tetanus and diphtheria toxoids (Td) booster doses every 10 years. Adults with an unknown or incomplete history of completing a 3-dose immunization series with Td-containing vaccines should begin or complete a primary immunization series including a Tdap dose. Adults should receive a Td booster every 10 years. Varicella vaccine. An adult without evidence of immunity to varicella should receive 2 doses or a second dose if she has previously received 1 dose. Pregnant females who do not have evidence of immunity should receive the first dose after pregnancy. This first dose should be obtained before  leaving the health care facility. The second dose should be obtained 4-8 weeks after the first dose. Human papillomavirus (HPV) vaccine. Females aged 13-26 years who have not received the vaccine previously should obtain the 3-dose series. The vaccine is not recommended for use in pregnant females. However, pregnancy testing is not needed before receiving a dose. If a female is found to  be pregnant after receiving a dose, no treatment is needed. In that case, the remaining doses should be delayed until after the pregnancy. Immunization is recommended for any person with an immunocompromised condition through the age of 55 years if she did not get any or all doses earlier. During the 3-dose series, the second dose should be obtained 4-8 weeks after the first dose. The third dose should be obtained 24 weeks after the first dose and 16 weeks after the second dose. Zoster vaccine. One dose is recommended for adults aged 15 years or older unless certain conditions are present. Measles, mumps, and rubella (MMR) vaccine. Adults born before 20 generally are considered immune to measles and mumps. Adults born in [birth year] or later should have 1 or more doses of MMR vaccine unless there is a contraindication to the vaccine or there is laboratory evidence of immunity to each of the three diseases. A routine second dose of MMR vaccine should be obtained at least 28 days after the first dose for students attending postsecondary schools, health care workers, or international travelers. People who received inactivated measles vaccine or an unknown type of measles vaccine during 1963-1967 should receive 2 doses of MMR vaccine. People who received inactivated mumps vaccine or an unknown type of mumps vaccine before 1979 and are at high risk for mumps infection should consider immunization with 2 doses of MMR vaccine. For females of childbearing age, rubella immunity should be determined. If there is no evidence of immunity, females  who are not pregnant should be vaccinated. If there is no evidence of immunity, females who are pregnant should delay immunization until after pregnancy. Unvaccinated health care workers born before [birth year] who lack laboratory evidence of measles, mumps, or rubella immunity or laboratory confirmation of disease should consider measles and mumps immunization with 2 doses of MMR vaccine or rubella immunization with 1 dose of MMR vaccine. Pneumococcal 13-valent conjugate (PCV13) vaccine. When indicated, a person who is uncertain of her immunization history and has no record of immunization should receive the PCV13 vaccine. An adult aged 4 years or older who has certain medical conditions and has not been previously immunized should receive 1 dose of PCV13 vaccine. This PCV13 should be followed with a dose of pneumococcal polysaccharide (PPSV23) vaccine. The PPSV23 vaccine dose should be obtained at least 1 or more year(s) after the dose of PCV13 vaccine. An adult aged 15 years or older who has certain medical conditions and previously received 1 or more doses of PPSV23 vaccine should receive 1 dose of PCV13. The PCV13 vaccine dose should be obtained 1 or more years after the last PPSV23 vaccine dose.  Pneumococcal polysaccharide (PPSV23) vaccine. When PCV13 is also indicated, PCV13 should be obtained first. All adults aged 9 years and older should be immunized. An adult younger than age 14 years who has certain medical conditions should be immunized. Any person who resides in a nursing home or long-term care facility should be immunized. An adult smoker should be immunized. People with an immunocompromised condition and certain other conditions should receive both PCV13 and PPSV23 vaccines. People with human immunodeficiency virus (HIV) infection should be immunized as soon as possible after diagnosis. Immunization during chemotherapy or radiation therapy should be avoided. Routine use of PPSV23 vaccine is not  recommended for American Indians, [place] Natives, or people younger than 65 years unless there are medical conditions that require PPSV23 vaccine. When indicated, people who have unknown immunization and have no record of immunization should receive  PPSV23 vaccine. One-time revaccination 5 years after the first dose of PPSV23 is recommended for people aged 19-64 years who have chronic kidney failure, nephrotic syndrome, asplenia, or immunocompromised conditions. People who received 1-2 doses of PPSV23 before age 36 years should receive another dose of PPSV23 vaccine at age 52 years or later if at least 5 years have passed since the previous dose. Doses of PPSV23 are not needed for people immunized with PPSV23 at or after age 34 years.  Preventive Services / Frequency  Ages 74 to 1 years Blood pressure check. Lipid and cholesterol check. Lung cancer screening. / Every year if you are aged 53-80 years and have a 30-pack-year history of smoking and currently smoke or have quit within the past 15 years. Yearly screening is stopped once you have quit smoking for at least 15 years or develop a health problem that would prevent you from having lung cancer treatment. Clinical breast exam.** / Every year after age 16 years.  BRCA-related cancer risk assessment.** / For women who have family members with a BRCA-related cancer (breast, ovarian, tubal, or peritoneal cancers). Mammogram.** / Every year beginning at age 26 years and continuing for as long as you are in good health. Consult with your health care provider. Pap test.** / Every 3 years starting at age 19 years through age 52 or 29 years with a history of 3 consecutive normal Pap tests. HPV screening.** / Every 3 years from ages 18 years through ages 4 to 110 years with a history of 3 consecutive normal Pap tests. Fecal occult blood test (FOBT) of stool. / Every year beginning at age 70 years and continuing until age 53 years. You may not need to do this  test if you get a colonoscopy every 10 years. Flexible sigmoidoscopy or colonoscopy.** / Every 5 years for a flexible sigmoidoscopy or every 10 years for a colonoscopy beginning at age 66 years and continuing until age 44 years. Hepatitis C blood test.** / For all people born from [birth year] through 1967 and any individual with known risks for hepatitis C. Skin self-exam. / Monthly. Influenza vaccine. / Every year. Tetanus, diphtheria, and acellular pertussis (Tdap/Td) vaccine.** / Consult your health care provider. Pregnant women should receive 1 dose of Tdap vaccine during each pregnancy. 1 dose of Td every 10 years. Varicella vaccine.** / Consult your health care provider. Pregnant females who do not have evidence of immunity should receive the first dose after pregnancy. Zoster vaccine.** / 1 dose for adults aged 90 years or older. Pneumococcal 13-valent conjugate (PCV13) vaccine.** / Consult your health care provider. Pneumococcal polysaccharide (PPSV23) vaccine.** / 1 to 2 doses if you smoke cigarettes or if you have certain conditions. Meningococcal vaccine.** / Consult your health care provider. Hepatitis A vaccine.** / Consult your health care provider. Hepatitis B vaccine.** / Consult your health care provider. Screening for abdominal aortic aneurysm (AAA)  by ultrasound is recommended for people over 50 who have history of high blood pressure or who are current or former smokers. ++++++++++++++++++ Recommend Adult Low Dose Aspirin or  coated  Aspirin 81 mg daily  To reduce risk of Colon Cancer 40 %,  Skin Cancer 26 % ,  Melanoma 46%  and  Pancreatic cancer 60% +++++++++++++++++++ Vitamin D goal  is between 70-100.  Please make sure that you are taking your Vitamin D as directed.  It is very important as a natural anti-inflammatory  helping hair, skin, and nails, as well as reducing stroke and heart  attack risk.  It helps your bones and helps with mood. It also decreases numerous  cancer risks so please take it as directed.  Low Vit D is associated with a 200-300% higher risk for CANCER  and 200-300% higher risk for HEART   ATTACK  &  STROKE.   .....................................Marland Kitchen It is also associated with higher death rate at younger ages,  autoimmune diseases like Rheumatoid arthritis, Lupus, Multiple Sclerosis.    Also many other serious conditions, like depression, Alzheimer's Dementia, infertility, muscle aches, fatigue, fibromyalgia - just to name a few. ++++++++++++++++++ Recommend the book "The END of DIETING" by Dr Excell Seltzer  & the book "The END of DIABETES " by Dr Excell Seltzer At [email or web address] - get book & Audio CD's    Being diabetic has a  300% increased risk for heart attack, stroke, cancer, and alzheimer- type vascular dementia. It is very important that you work harder with diet by avoiding all foods that are white. Avoid white rice (brown & wild rice is OK), white potatoes (sweetpotatoes in moderation is OK), White bread or wheat bread or anything made out of white flour like bagels, donuts, rolls, buns, biscuits, cakes, pastries, cookies, pizza crust, and pasta (made from white flour & egg whites) - vegetarian pasta or spinach or wheat pasta is OK. Multigrain breads like Arnold's or [employer], or multigrain sandwich thins or flatbreads.  Diet, exercise and weight loss can reverse and cure diabetes in the early stages.  Diet, exercise and weight loss is very important in the control and prevention of complications of diabetes which affects every system in your body, ie. Brain - dementia/stroke, eyes - glaucoma/blindness, heart - heart attack/heart failure, kidneys - dialysis, stomach - gastric paralysis, intestines - malabsorption, nerves - severe painful neuritis, circulation - gangrene & loss of a leg(s), and finally cancer and Alzheimers.    I recommend avoid fried & greasy foods,  sweets/candy, white rice (brown or wild rice or Quinoa is OK), white  potatoes (sweet potatoes are OK) - anything made from white flour - bagels, doughnuts, rolls, buns, biscuits,white and wheat breads, pizza crust and traditional pasta made of white flour & egg white(vegetarian pasta or spinach or wheat pasta is OK).  Multi-grain bread is OK - like multi-grain flat bread or sandwich thins. Avoid alcohol in excess. Exercise is also important.    Eat all the vegetables you want - avoid meat, especially red meat and dairy - especially cheese.  Cheese is the most concentrated form of trans-fats which is the worst thing to clog up our arteries. Veggie cheese is OK which can be found in the fresh produce section at [employer] or [employer] or [employer]  ++++++++++++++++++++++ DASH Eating Plan  DASH stands for "Dietary Approaches to Stop Hypertension."   The DASH eating plan is a healthy eating plan that has been shown to reduce high blood pressure (hypertension). Additional health benefits may include reducing the risk of type 2 diabetes mellitus, heart disease, and stroke. The DASH eating plan may also help with weight loss. WHAT DO I NEED TO KNOW ABOUT THE DASH EATING PLAN? For the DASH eating plan, you will follow these general guidelines: Choose foods with a percent daily value for sodium of less than 5% (as listed on the food label). Use salt-free seasonings or herbs instead of table salt or sea salt. Check with your health care provider or pharmacist before using salt substitutes. Eat lower-sodium products, often labeled as "lower sodium" or "no  salt added." Eat fresh foods. Eat more vegetables, fruits, and low-fat dairy products. Choose whole grains. Look for the word "whole" as the first word in the ingredient list. Choose fish  Limit sweets, desserts, sugars, and sugary drinks. Choose heart-healthy fats. Eat veggie cheese  Eat more home-cooked food and less restaurant, buffet, and fast food. Limit fried foods. Cook foods using methods other than  frying. Limit canned vegetables. If you do use them, rinse them well to decrease the sodium. When eating at a restaurant, ask that your food be prepared with less salt, or no salt if possible.                      WHAT FOODS CAN I EAT? Read Dr Fara Olden Fuhrman's books on The End of Dieting & The End of Diabetes  Grains Whole grain or whole wheat bread. Brown rice. Whole grain or whole wheat pasta. Quinoa, bulgur, and whole grain cereals. Low-sodium cereals. Corn or whole wheat flour tortillas. Whole grain cornbread. Whole grain crackers. Low-sodium crackers.  Vegetables Fresh or frozen vegetables (raw, steamed, roasted, or grilled). Low-sodium or reduced-sodium tomato and vegetable juices. Low-sodium or reduced-sodium tomato sauce and paste. Low-sodium or reduced-sodium canned vegetables.   Fruits All fresh, canned (in natural juice), or frozen fruits.  Protein Products  All fish and seafood.  Dried beans, peas, or lentils. Unsalted nuts and seeds. Unsalted canned beans.  Dairy Low-fat dairy products, such as skim or 1% milk, 2% or reduced-fat cheeses, low-fat ricotta or cottage cheese, or plain low-fat yogurt. Low-sodium or reduced-sodium cheeses.  Fats and Oils Tub margarines without trans fats. Light or reduced-fat mayonnaise and salad dressings (reduced sodium). Avocado. Safflower, olive, or canola oils. Natural peanut or almond butter.  Other Unsalted popcorn and pretzels. The items listed above may not be a complete list of recommended foods or beverages. Contact your dietitian for more options.  ++++++++++++++++++  WHAT FOODS ARE NOT RECOMMENDED? Grains/ White flour or wheat flour White bread. White pasta. White rice. Refined cornbread. Bagels and croissants. Crackers that contain trans fat.  Vegetables  Creamed or fried vegetables. Vegetables in a . Regular canned vegetables. Regular canned tomato sauce and paste. Regular tomato and vegetable juices.  Fruits Dried fruits.  Canned fruit in light or heavy syrup. Fruit juice.  Meat and Other Protein Products Meat in general - RED meat & White meat.  Fatty cuts of meat. Ribs, chicken wings, all processed meats as bacon, sausage, bologna, salami, fatback, hot dogs, bratwurst and packaged luncheon meats.  Dairy Whole or 2% milk, cream, half-and-half, and cream cheese. Whole-fat or sweetened yogurt. Full-fat cheeses or blue cheese. Non-dairy creamers and whipped toppings. Processed cheese, cheese spreads, or cheese curds.  Condiments Onion and garlic salt, seasoned salt, table salt, and sea salt. Canned and packaged gravies. Worcestershire sauce. Tartar sauce. Barbecue sauce. Teriyaki sauce. Soy sauce, including reduced sodium. Steak sauce. Fish sauce. Oyster sauce. Cocktail sauce. Horseradish. Ketchup and mustard. Meat flavorings and tenderizers. Bouillon cubes. Hot sauce. Tabasco sauce. Marinades. Taco seasonings. Relishes.  Fats and Oils Butter, stick margarine, lard, shortening and bacon fat. Coconut, palm kernel, or palm oils. Regular salad dressings.  Pickles and olives. Salted popcorn and pretzels.  The items listed above may not be a complete list of foods and beverages to avoid.

## 2021-11-02 NOTE — Progress Notes (Unsigned)
Annual Screening/Preventative Visit & Comprehensive Evaluation &  Examination  Future Appointments  Date Time Provider Department  11/03/2021 11:00 AM Lucky Cowboy, MD [facility]  01/13/2022  4:00 PM Levert Feinstein, MD [facility]  11/08/2022 11:00 AM Lucky Cowboy, MD [facility]          This very nice 61 y.o. MWF presents for a Screening /Preventative Visit & comprehensive evaluation and management of multiple medical co-morbidities.  Patient has been followed for HTN, HLD, Prediabetes  and Vitamin D Deficiency. Patient also has hx/o ADD .        Patient is followed expectantly for Labile HTN predates since 2009 .  At that time she presented with a Rt MCA CVA due to a Right Carotid Artery dissection attributed to Fibromuscular Dysplasia. Patient recovered with mild residual Lt spastic hemi-paresis. Dr Terrace Arabia follows the patient for Botox inj for her spasticity & also for her migraines.  Patient's BP has been controlled at home and patient denies any cardiac symptoms as chest pain, palpitations, shortness of breath, dizziness or ankle swelling. Today's BP is at goal - 126/60 .         Patient's hyperlipidemia is not controlled with diet and Rosuvastatin /Fenofibrate.   Patient denies myalgias or other medication SE's. Last lipids were not at goal and also had  sl. elevated Trig's :  Lab Results  Component Value Date   CHOL 212 (H) 05/07/2021   HDL 59 05/07/2021   LDLCALC 118 (H) 05/07/2021   TRIG 248 (H) 05/07/2021   CHOLHDL 3.6 05/07/2021         In 2007, patient has subtotal Thyroidectomy for Goiter and was initiated on suppressive Replacement therapy.       Patient has hx/o prediabetes predating  (A1c 5.8% /2008) and patient denies reactive hypoglycemic symptoms, visual blurring, diabetic polys or paresthesias. Last A1c was at  goal :  Lab Results  Component Value Date   HGBA1C 5.5 11/03/2020          Finally, patient has history of Vitamin D Deficiency and last Vitamin D  was at goal :  Lab Results  Component Value Date   VD25OH 89 08/27/2021       Current Outpatient Medications:    albuterol (PROVENTIL HFA) 108 (90 Base) MCG/ACT inhaler, 1 to 2 inhalations 10-15 minutes apart every 4 hours if needed for asthma rescue, Disp: 48 g, Rfl: 3   ALPRAZolam (XANAX) 0.5 MG tablet, TAKE 1/2-1 TABLET AT BEDTIME IF NEEDED FOR SLEEP&LIMIT TO 5 DAYS/WEEK TO AVOID ADDICTION&DEMENTIA Strength: 0.5 mg, Disp: 30 tablet, Rfl: 0   amphetamine-dextroamphetamine (ADDERALL) 10 MG tablet, Take  1/2 - 1 tablet  1 or 2 x /day  for ADD, Disp: 60 tablet, Rfl: 0   botulinum toxin Type A (BOTOX) 100 units SOLR injection, Inject 300 Units into the muscle every 3 (three) months., Disp: , Rfl:    buPROPion (WELLBUTRIN XL) 150 MG 24 hr tablet, TAKE 1 TABLET EVERY MORNING FOR MOOD. FOCUS & CONCENTRATION, Disp: 90 tablet, Rfl: 3   butalbital-acetaminophen-caffeine (FIORICET) 50-325-40 MG tablet, TAKE 1 TABLET BY MOUTH DAILY AS NEEDED FOR HEADACHE. DO NOT REFILL IN LESS THAN 30 DAYS,, Disp: 12 tablet, Rfl: 4   cetirizine (ZYRTEC) 10 MG tablet, Take 10 mg by mouth daily., Disp: , Rfl:    Estradiol 10 MCG TABS vaginal tablet, Place 10 mcg vaginally 2 (two) times a week., Disp: , Rfl:    fenofibrate micronized (LOFIBRA) 134 MG capsule, TAKE 1 CAPSULE DAILY  FOR TRIGLYCERIDES (BLOOD FATS), Disp: 90 capsule, Rfl: 3   fluticasone furoate-vilanterol (BREO ELLIPTA) 100-25 MCG/ACT AEPB, Use  1 Inhalation  Daily, Disp: 60 each, Rfl: 3   lamoTRIgine (LAMICTAL) 100 MG tablet, Take 1 tablet (100 mg total) by mouth 2 (two) times daily., Disp: 180 tablet, Rfl: 4   levothyroxine (SYNTHROID) 88 MCG tablet, TAKE 1 TABLET DAILY ON AN EMPTY STOMACH WITH ONLY WATER FOR 30 MINS& NO ANTACID MEDS, CALCIUM, OR MAGNESIUM FOR 4 HOURS. AVOID BIOTIN, Disp: 90 tablet, Rfl: 3   magnesium oxide (MAG-OX) 400 MG tablet, Take 400 mg by mouth 2 (two) times daily. , Disp: , Rfl:    meloxicam (MOBIC) 7.5 MG tablet, Take 1 tablet (7.5  mg total) by mouth 2 (two) times daily as needed for pain., Disp: 30 tablet, Rfl: 6   montelukast (SINGULAIR) 10 MG tablet, TAKE 1 TABLET BY MOUTH AT BEDTIME FOR ALLERGIES, Disp: 90 tablet, Rfl: 1   omeprazole (PRILOSEC) 40 MG capsule, Take  1 capsule  Daily  to Prevent Heartburn & Indigestion / Patient knows to take by mouth, Disp: 90 capsule, Rfl: 3   ondansetron (ZOFRAN-ODT) 8 MG disintegrating tablet, DISSOLVE 1 TABLET UNDER TONGUE EVERY 6 TO 8 HOURS FOR NAUSEA OR VOMITTING, Disp: 30 tablet, Rfl: 3   OVER THE COUNTER MEDICATION, Key E Suppositories, Disp: , Rfl:    Riboflavin 100 MG TABS, Take 100 mg by mouth. bid, Disp: , Rfl:    rosuvastatin (CRESTOR) 40 MG tablet, TAKE 1 TABLET BY MOUTH EVERY DAY FOR CHOLESTEROL, Disp: 90 tablet, Rfl: 3   UBRELVY 100 MG TABS, TAKE 1 TABLET BY MOUTH AS NEEDED., Disp: 10 tablet, Rfl: 5   verapamil (CALAN-SR) 240 MG CR tablet, TAKE 1 TABLET DAILY WITH FOOD FOR BLOOD PRESSURE & MIGRAINE PREVENTION, Disp: 90 tablet, Rfl: 3   vitamin B-12 (CYANOCOBALAMIN) 1000 MCG tablet, Take 1,000 mcg by mouth daily., Disp: , Rfl:    Vitamin D, Ergocalciferol, (DRISDOL) 1.25 MG (50000 UNIT) CAPS capsule, Take 1 capsule three days a week for Vitamin D deficiency., Disp: 90 capsule, Rfl: 1   zinc gluconate 50 MG tablet, Take 50 mg by mouth daily., Disp: , Rfl:   Current Facility-Administered Medications:    botulinum toxin Type A (BOTOX) injection 300 Units, 300 Units, Intramuscular, Once, Levert Feinstein, MD    Allergies  Allergen Reactions   Penicillins Anaphylaxis and Hives   Imitrex [Sumatriptan]    Other Swelling    kiwi   Sulfa Antibiotics Hives   Zomig [Zolmitriptan]      Past Medical History:  Diagnosis Date   Anemia    Anxiety    Asthma    Chronic heartburn    Depression    Elevated cholesterol    on Crestor   Headache(784.0)    Hypertension    Hypothyroidism    S/P thyroidectomy   Migraine    Occlusion and stenosis of carotid artery with cerebral  infarction    Spasm of muscle    Unspecified cerebral artery occlusion with cerebral infarction    Vitamin D deficiency      Health Maintenance  Topic Date Due   Zoster Vaccines- Shingrix (1 of 2) Never done   Pneumococcal Vaccine 35-78 Years old (2 - PCV) 03/30/1999   PAP SMEAR-Modifier  05/07/2017   COVID-19 Vaccine (3 - Pfizer risk series) 08/01/2019   INFLUENZA VACCINE  10/27/2020   COLONOSCOPY  01/13/2021   MAMMOGRAM  01/03/2022   TETANUS/TDAP  05/18/2026   Hepatitis  C Screening  Completed   HIV Screening  Completed   HPV VACCINES  Aged Out     Immunization History  Administered Date(s) Administered   Influenza Inj Mdck Quad  01/10/2019, 01/25/2020   Influenza Split 01/24/2014   Influenza, Seasonal 02/09/2016   Influenza 11/28/2011   PFIZER  SARS-COV-2 Vacc 06/11/2019, 07/04/2019   PPD Test 05/18/2016, 06/20/2017, 07/27/2018   Pneumococcal -23 03/29/1998   Td 03/29/2002   Tdap 05/18/2016    Last Colon -  01/14/2020 - Dr Kerin Salen cited poor colon prep &  recommended repeat in  6 months ~ April 2022 - (overdue)    Last MGM - 01/20/2021   Past Surgical History:  Procedure Laterality Date   AUGMENTATION MAMMAPLASTY Bilateral 2000   COSMETIC SURGERY     CYSTOCELE REPAIR N/A 01/08/2013   Procedure: ANTERIOR REPAIR (CYSTOCELE);  Surgeon: Meriel Pica, MD;  Location: [facility];  Service: Gynecology;  Laterality: N/A;   NASAL SINUS SURGERY     VAGINAL HYSTERECTOMY       Family History  Problem Relation Age of Onset   Heart disease Mother    Peptic Ulcer Disease Mother    Heart disease Father    Parkinson's disease Father      Social History   Tobacco Use   Smoking status: Never   Smokeless tobacco: Never  Substance Use Topics   Alcohol use: Yes    Comment: occassionally drinks a glass of wine   Drug use: No      ROS Constitutional: Denies fever, chills, weight loss/gain, headaches, insomnia,  night sweats, and change in appetite. Does c/o  fatigue. Eyes: Denies redness, blurred vision, diplopia, discharge, itchy, watery eyes.  ENT: Denies discharge, congestion, post nasal drip, epistaxis, sore throat, earache, hearing loss, dental pain, Tinnitus, Vertigo, Sinus pain, snoring.  Cardio: Denies chest pain, palpitations, irregular heartbeat, syncope, dyspnea, diaphoresis, orthopnea, PND, claudication, edema Respiratory: denies cough, dyspnea, DOE, pleurisy, hoarseness, laryngitis, wheezing.  Gastrointestinal: Denies dysphagia, heartburn, reflux, water brash, pain, cramps, nausea, vomiting, bloating, diarrhea, constipation, hematemesis, melena, hematochezia, jaundice, hemorrhoids Genitourinary: Denies dysuria, frequency, urgency, nocturia, hesitancy, discharge, hematuria, flank pain Breast: Breast lumps, nipple discharge, bleeding.  Musculoskeletal: Denies arthralgia, myalgia, stiffness, Jt. Swelling, pain, limp, and strain/sprain. Denies falls. Skin: Denies puritis, rash, hives, warts, acne, eczema, changing in skin lesion Neuro: No weakness, tremor, incoordination, spasms, paresthesia, pain Psychiatric: Denies confusion, memory loss, sensory loss. Denies Depression. Endocrine: Denies change in weight, skin, hair change, nocturia, and paresthesia, diabetic polys, visual blurring, hyper / hypo glycemic episodes.  Heme/Lymph: No excessive bleeding, bruising, enlarged lymph nodes.  Physical Exam  There were no vitals taken for this visit.  General Appearance: Well nourished, well groomed and in no apparent distress.  Eyes: PERRLA, EOMs, conjunctiva no swelling or erythema, normal fundi and vessels. Sinuses: No frontal/maxillary tenderness ENT/Mouth: EACs patent / TMs  nl. Nares clear without erythema, swelling, mucoid exudates. Oral hygiene is good. No erythema, swelling, or exudate. Tongue normal, non-obstructing. Tonsils not swollen or erythematous. Hearing normal.  Neck: Supple, thyroid not palpable. No bruits, nodes or  JVD. Respiratory: Respiratory effort normal.  BS equal and clear bilateral without rales, rhonci, wheezing or stridor. Cardio: Heart sounds are normal with regular rate and rhythm and no murmurs, rubs or gallops. Peripheral pulses are normal and equal bilaterally without edema. No aortic or femoral bruits. Chest: symmetric with normal excursions and percussion. Breasts: Deferred  to upcoming MGM .  Abdomen: Flat, soft with bowel sounds active.  Nontender, no guarding, rebound, hernias, masses, or organomegaly.  Lymphatics: Non tender without lymphadenopathy.  Musculoskeletal: Full ROM all peripheral extremities, joint stability, 5/5 strength, and normal gait. Skin: Warm and dry without rashes, lesions, cyanosis, clubbing or  ecchymosis.  Neuro: Cranial nerves intact. Mild Lt hyperreflexia and increased L side tone Upper>Lower. No cerebellar symptoms. Sensation intact.  Pysch: Alert and oriented X 3, normal affect, Insight and Judgment appropriate.    Assessment and Plan  1. Annual Preventative Screening Examination   2. Labile hypertension  - EKG 12-Lead - [place], RETROPERITNL ABD,  LTD - Urinalysis, Routine w reflex microscopic - Microalbumin / creatinine urine ratio - CBC with Differential/Platelet - COMPLETE METABOLIC PANEL WITH GFR - Magnesium - TSH  3. Hyperlipidemia, mixed  - EKG 12-Lead - [place], RETROPERITNL ABD,  LTD - Lipid panel - TSH  4. Abnormal glucose  - EKG 12-Lead - [place], RETROPERITNL ABD,  LTD - Hemoglobin A1c - Insulin, random  5. Vitamin D deficiency  - VITAMIN D 25 Hydroxy  6. ADD (attention deficit disorder) without hyperactivity   7. Spastic hemiplegia of left nondominant side as late effect of cerebrovascular  disease, unspecified cerebrovascular disease type (HCC)   8. Chronic migraine w/o aura w/o status migrainosus, not intractable   9. Hypothyroidism due to non-medication exogenous substances   10. Screening for colorectal cancer  - POC  Hemoccult Bld/Stl - TSH  11. Screening for heart disease  - EKG 12-Lead  12. FHx: heart disease  - EKG 12-Lead - [place], RETROPERITNL ABD,  LTD  13. Screening for AAA (aortic abdominal aneurysm)  - [place], RETROPERITNL ABD,  LTD  14. Fatigue  - Iron, Total/Total Iron Binding Cap - Vitamin B12  15. Medication management  - Urinalysis, Routine w reflex microscopic - Microalbumin / creatinine urine ratio - CBC with Differential/Platelet - COMPLETE METABOLIC PANEL WITH GFR - Magnesium - Lipid panel - TSH - Hemoglobin A1c - Insulin, random - VITAMIN D 25 Hydroxy   16. Screening-pulmonary TB  - TB Skin Test        Patient was counseled in prudent diet to achieve/maintain BMI less than 25 for weight control, BP monitoring, regular exercise and medications. Discussed med's effects and SE's. Screening labs and tests as requested with regular follow-up as recommended.  New  Rx  for Trazodone to try for Sleep.  Over 40 minutes of exam, counseling, chart review and high complex critical decision making was performed.   Marinus Maw, MD

## 2021-11-03 ENCOUNTER — Ambulatory Visit (INDEPENDENT_AMBULATORY_CARE_PROVIDER_SITE_OTHER): Payer: Medicare Other | Admitting: Internal Medicine

## 2021-11-03 ENCOUNTER — Other Ambulatory Visit: Payer: Self-pay | Admitting: Internal Medicine

## 2021-11-03 ENCOUNTER — Encounter: Payer: Self-pay | Admitting: Internal Medicine

## 2021-11-03 VITALS — BP 126/60 | HR 96 | Temp 97.9°F | Resp 17 | Ht 69.0 in | Wt 162.4 lb

## 2021-11-03 DIAGNOSIS — R7309 Other abnormal glucose: Secondary | ICD-10-CM

## 2021-11-03 DIAGNOSIS — Z1211 Encounter for screening for malignant neoplasm of colon: Secondary | ICD-10-CM

## 2021-11-03 DIAGNOSIS — Z0000 Encounter for general adult medical examination without abnormal findings: Secondary | ICD-10-CM | POA: Diagnosis not present

## 2021-11-03 DIAGNOSIS — G43709 Chronic migraine without aura, not intractable, without status migrainosus: Secondary | ICD-10-CM

## 2021-11-03 DIAGNOSIS — R0989 Other specified symptoms and signs involving the circulatory and respiratory systems: Secondary | ICD-10-CM | POA: Diagnosis not present

## 2021-11-03 DIAGNOSIS — Z79899 Other long term (current) drug therapy: Secondary | ICD-10-CM

## 2021-11-03 DIAGNOSIS — Z0001 Encounter for general adult medical examination with abnormal findings: Secondary | ICD-10-CM

## 2021-11-03 DIAGNOSIS — Z8249 Family history of ischemic heart disease and other diseases of the circulatory system: Secondary | ICD-10-CM

## 2021-11-03 DIAGNOSIS — E782 Mixed hyperlipidemia: Secondary | ICD-10-CM | POA: Diagnosis not present

## 2021-11-03 DIAGNOSIS — Z136 Encounter for screening for cardiovascular disorders: Secondary | ICD-10-CM | POA: Diagnosis not present

## 2021-11-03 DIAGNOSIS — I69954 Hemiplegia and hemiparesis following unspecified cerebrovascular disease affecting left non-dominant side: Secondary | ICD-10-CM

## 2021-11-03 DIAGNOSIS — E559 Vitamin D deficiency, unspecified: Secondary | ICD-10-CM | POA: Diagnosis not present

## 2021-11-03 DIAGNOSIS — F988 Other specified behavioral and emotional disorders with onset usually occurring in childhood and adolescence: Secondary | ICD-10-CM

## 2021-11-03 DIAGNOSIS — D508 Other iron deficiency anemias: Secondary | ICD-10-CM

## 2021-11-03 DIAGNOSIS — Z111 Encounter for screening for respiratory tuberculosis: Secondary | ICD-10-CM

## 2021-11-03 DIAGNOSIS — I700 Atherosclerosis of aorta: Secondary | ICD-10-CM | POA: Diagnosis not present

## 2021-11-03 DIAGNOSIS — D649 Anemia, unspecified: Secondary | ICD-10-CM | POA: Diagnosis not present

## 2021-11-03 DIAGNOSIS — R5383 Other fatigue: Secondary | ICD-10-CM

## 2021-11-03 DIAGNOSIS — E032 Hypothyroidism due to medicaments and other exogenous substances: Secondary | ICD-10-CM

## 2021-11-03 MED ORDER — TRAZODONE HCL 150 MG PO TABS: ORAL_TABLET | ORAL | 3 refills | Status: DC

## 2021-11-04 LAB — CBC WITH DIFFERENTIAL/PLATELET
Absolute Monocytes: 314 cells/uL (ref 200–950)
Basophils Absolute: 20 cells/uL (ref 0–200)
Basophils Relative: 0.4 %
Eosinophils Absolute: 152 cells/uL (ref 15–500)
Eosinophils Relative: 3.1 %
HCT: 44.2 % (ref 35.0–45.0)
Hemoglobin: 15 g/dL (ref 11.7–15.5)
Lymphs Abs: 1362 cells/uL (ref 850–3900)
MCH: 31.6 pg (ref 27.0–33.0)
MCHC: 33.9 g/dL (ref 32.0–36.0)
MCV: 93.1 fL (ref 80.0–100.0)
MPV: 11.3 fL (ref 7.5–12.5)
Monocytes Relative: 6.4 %
Neutro Abs: 3053 cells/uL (ref 1500–7800)
Neutrophils Relative %: 62.3 %
Platelets: 186 10*3/uL (ref 140–400)
RBC: 4.75 10*6/uL (ref 3.80–5.10)
RDW: 12.5 % (ref 11.0–15.0)
Total Lymphocyte: 27.8 %
WBC: 4.9 10*3/uL (ref 3.8–10.8)

## 2021-11-04 LAB — INSULIN, RANDOM: Insulin: 24.7 u[IU]/mL — ABNORMAL HIGH

## 2021-11-04 LAB — COMPLETE METABOLIC PANEL WITH GFR
AG Ratio: 2.1 (calc) (ref 1.0–2.5)
ALT: 23 U/L (ref 6–29)
AST: 19 U/L (ref 10–35)
Albumin: 4.7 g/dL (ref 3.6–5.1)
Alkaline phosphatase (APISO): 70 U/L (ref 37–153)
BUN: 16 mg/dL (ref 7–25)
CO2: 27 mmol/L (ref 20–32)
Calcium: 10 mg/dL (ref 8.6–10.4)
Chloride: 106 mmol/L (ref 98–110)
Creat: 1.02 mg/dL (ref 0.50–1.05)
Globulin: 2.2 g/dL (calc) (ref 1.9–3.7)
Glucose, Bld: 101 mg/dL — ABNORMAL HIGH (ref 65–99)
Potassium: 4.8 mmol/L (ref 3.5–5.3)
Sodium: 142 mmol/L (ref 135–146)
Total Bilirubin: 0.5 mg/dL (ref 0.2–1.2)
Total Protein: 6.9 g/dL (ref 6.1–8.1)
eGFR: 62 mL/min/{1.73_m2} (ref >=60)

## 2021-11-04 LAB — URINALYSIS, ROUTINE W REFLEX MICROSCOPIC
Bacteria, UA: NONE SEEN /HPF
Bilirubin Urine: NEGATIVE
Glucose, UA: NEGATIVE
Hgb urine dipstick: NEGATIVE
Hyaline Cast: NONE SEEN /LPF
Ketones, ur: NEGATIVE
Nitrite: NEGATIVE
Protein, ur: NEGATIVE
RBC / HPF: NONE SEEN /HPF (ref 0–2)
Specific Gravity, Urine: 1.013 (ref 1.001–1.035)
Squamous Epithelial / HPF: NONE SEEN /HPF (ref <=5)
WBC, UA: NONE SEEN /HPF (ref 0–5)
pH: 7 (ref 5.0–8.0)

## 2021-11-04 LAB — MICROALBUMIN / CREATININE URINE RATIO
Creatinine, Urine: 76 mg/dL (ref 20–275)
Microalb, Ur: <0.2 mg/dL

## 2021-11-04 LAB — IRON, TOTAL/TOTAL IRON BINDING CAP
%SAT: 32 % (calc) (ref 16–45)
Iron: 115 ug/dL (ref 45–160)
TIBC: 362 mcg/dL (calc) (ref 250–450)

## 2021-11-04 LAB — MAGNESIUM: Magnesium: 2.1 mg/dL (ref 1.5–2.5)

## 2021-11-04 LAB — LIPID PANEL
Cholesterol: 141 mg/dL (ref <200)
HDL: 54 mg/dL (ref >=50)
LDL Cholesterol (Calc): 64 mg/dL (calc)
Non-HDL Cholesterol (Calc): 87 mg/dL (calc) (ref <130)
Total CHOL/HDL Ratio: 2.6 (calc) (ref <5.0)
Triglycerides: 143 mg/dL (ref <150)

## 2021-11-04 LAB — HEMOGLOBIN A1C
Hgb A1c MFr Bld: 5.5 % of total Hgb (ref <5.7)
Mean Plasma Glucose: 111 mg/dL
eAG (mmol/L): 6.2 mmol/L

## 2021-11-04 LAB — TSH: TSH: 2.04 mIU/L (ref 0.40–4.50)

## 2021-11-04 LAB — VITAMIN D 25 HYDROXY (VIT D DEFICIENCY, FRACTURES): Vit D, 25-Hydroxy: 95 ng/mL (ref 30–100)

## 2021-11-04 LAB — VITAMIN B12: Vitamin B-12: 372 pg/mL (ref 200–1100)

## 2021-11-04 NOTE — Progress Notes (Signed)
<><><><><><><><><><><><><><><><><><><><><><><><><><><><><><><><><> <><><><><><><><><><><><><><><><><><><><><><><><><><><><><><><><><> -   Test results slightly outside the reference range are not unusual. If there is anything important, I will review this with you,  otherwise it is considered normal test values.  If you have further questions,  please do not hesitate to contact me at the office or via My Chart.  <><><><><><><><><><><><><><><><><><><><><><><><><><><><><><><><><> <><><><><><><><><><><><><><><><><><><><><><><><><><><><><><><><><>  -  Iron levels & CBC - both Normal & OK  <><><><><><><><><><><><><><><><><><><><><><><><><><><><><><><><><> <><><><><><><><><><><><><><><><><><><><><><><><><><><><><><><><><>  -  Vitamin B12 =  372  is   Very Low  -You're not absorbing the regular B12 tablets  !    (( You MUST get the sub-lingual form that  dissolves under the tongue  ))  (Ideal or Goal Vit B12 is between 450 - 1,100)   Low Vit B12 may be associated with Anemia , Fatigue,   Peripheral Neuropathy, Dementia, "Brain Fog", & Depression  - Recommend take a sub-lingual form of Vitamin B12 tablet   1,000 to 5,000 mcg tab that you dissolve under your tongue /Daily   - Can get Lavonia Dana - best price at [employer] or on [employer] <><><><><><><><><><><><><><><><><><><><><><><><><><><><><><><><><> <><><><><><><><><><><><><><><><><><><><><><><><><><><><><><><><><>  - Total Chol =  141  much Better   ( was 212 )  Excellent   - Very low risk for Heart Attack  / Stroke <><><><><><><><><><><><><><><><><><><><><><><><><><><><><><><><><> <><><><><><><><><><><><><><><><><><><><><><><><><><><><><><><><><>  - A1c - Normal - No Diabetes  <><><><><><><><><><><><><><><><><><><><><><><><><><><><><><><><><> <><><><><><><><><><><><><><><><><><><><><><><><><><><><><><><><><>  - Vitamin D = 95  - Excellent   -  Please keep dose same   <><><><><><><><><><><><><><><><><><><><><><><><><><><><><><><><><> <><><><><><><><><><><><><><><><><><><><><><><><><><><><><><><><><>

## 2021-11-09 ENCOUNTER — Encounter: Payer: Self-pay | Admitting: Internal Medicine

## 2021-11-12 DIAGNOSIS — J45998 Other asthma: Secondary | ICD-10-CM | POA: Diagnosis not present

## 2021-11-12 DIAGNOSIS — J40 Bronchitis, not specified as acute or chronic: Secondary | ICD-10-CM | POA: Diagnosis not present

## 2021-11-12 DIAGNOSIS — J208 Acute bronchitis due to other specified organisms: Secondary | ICD-10-CM | POA: Diagnosis not present

## 2021-11-12 DIAGNOSIS — R059 Cough, unspecified: Secondary | ICD-10-CM | POA: Diagnosis not present

## 2021-12-07 ENCOUNTER — Other Ambulatory Visit: Payer: Self-pay | Admitting: Nurse Practitioner

## 2021-12-07 DIAGNOSIS — F988 Other specified behavioral and emotional disorders with onset usually occurring in childhood and adolescence: Secondary | ICD-10-CM

## 2021-12-08 MED ORDER — AMPHETAMINE-DEXTROAMPHETAMINE 10 MG PO TABS: ORAL_TABLET | ORAL | 0 refills | Status: DC

## 2021-12-13 ENCOUNTER — Other Ambulatory Visit: Payer: Self-pay | Admitting: Nurse Practitioner

## 2021-12-13 ENCOUNTER — Encounter: Payer: Self-pay | Admitting: Internal Medicine

## 2021-12-13 ENCOUNTER — Other Ambulatory Visit: Payer: Self-pay | Admitting: Neurology

## 2021-12-13 DIAGNOSIS — J40 Bronchitis, not specified as acute or chronic: Secondary | ICD-10-CM | POA: Diagnosis not present

## 2021-12-13 DIAGNOSIS — R059 Cough, unspecified: Secondary | ICD-10-CM | POA: Diagnosis not present

## 2021-12-13 DIAGNOSIS — F988 Other specified behavioral and emotional disorders with onset usually occurring in childhood and adolescence: Secondary | ICD-10-CM

## 2021-12-13 DIAGNOSIS — J208 Acute bronchitis due to other specified organisms: Secondary | ICD-10-CM | POA: Diagnosis not present

## 2021-12-13 DIAGNOSIS — J45998 Other asthma: Secondary | ICD-10-CM | POA: Diagnosis not present

## 2021-12-14 ENCOUNTER — Other Ambulatory Visit: Payer: Self-pay | Admitting: Internal Medicine

## 2021-12-14 DIAGNOSIS — Z1231 Encounter for screening mammogram for malignant neoplasm of breast: Secondary | ICD-10-CM

## 2021-12-14 NOTE — Telephone Encounter (Signed)
Verify Drug Registry For butalbital-acetaminophen-caffeine [facility]) [phone] MG tablet Last appointment: 04/06/2021 Next appointment: 01/13/2022

## 2021-12-17 ENCOUNTER — Telehealth: Payer: Self-pay | Admitting: *Deleted

## 2021-12-17 NOTE — Patient Outreach (Signed)
  Care Coordination   Initial Visit Note   12/17/2021 Name: Ruth Gregory MRN: [id number] DOB: 07-13-1960  Ruth Gregory is a 61 y.o. year old female who sees Unk Pinto, MD for primary care. I spoke with  [name] by phone today.  What matters to the patients health and wellness today?  Scheduled for Care Coordination intake tomorrow with CSW    Goals Addressed   None     SDOH assessments and interventions completed:  No     Care Coordination Interventions Activated:  Yes  Care Coordination Interventions:  No, not indicated   Follow up plan: Follow up call scheduled for 12/18/21    Encounter Outcome:  Pt. Scheduled

## 2021-12-18 ENCOUNTER — Telehealth: Payer: Self-pay | Admitting: *Deleted

## 2021-12-21 ENCOUNTER — Encounter: Payer: Self-pay | Admitting: Internal Medicine

## 2021-12-21 ENCOUNTER — Other Ambulatory Visit: Payer: Self-pay | Admitting: Internal Medicine

## 2021-12-21 MED ORDER — GABAPENTIN 300 MG PO CAPS: ORAL_CAPSULE | ORAL | 2 refills | Status: DC

## 2021-12-21 NOTE — Patient Outreach (Signed)
  Care Coordination   12/18/2021 Name: Clarie Camey MRN: [id number] DOB: 1961/01/21   Care Coordination Outreach Attempts:  An unsuccessful telephone outreach was attempted today to offer the patient information about available care coordination services as a benefit of their health plan.   Follow Up Plan:  Additional outreach attempts will be made to offer the patient care coordination information and services.   Encounter Outcome:  No Answer  Care Coordination Interventions Activated:  No   Care Coordination Interventions:  No, not indicated    Eduard Clos MSW, LCSW Licensed Clinical Social Worker      [phone]

## 2021-12-29 NOTE — Progress Notes (Unsigned)
Assessment and Plan:  There are no diagnoses linked to this encounter.    Further disposition pending results of labs. Discussed med's effects and SE's.   Over 30 minutes of exam, counseling, chart review, and critical decision making was performed.   Future Appointments  Date Time Provider Department Center  12/30/2021  1:45 PM Raynelle Dick, NP [facility] None  01/13/2022  4:00 PM Levert Feinstein, MD [facility] None  02/01/2022 11:40 AM [facility] 2 [facility]  02/08/2022 11:30 AM Raynelle Dick, NP [facility] None  05/17/2022 10:30 AM Lucky Cowboy, MD [facility] None  11/08/2022 11:00 AM Lucky Cowboy, MD [facility] None    ------------------------------------------------------------------------------------------------------------------   HPI There were no vitals taken for this visit. 61 y.o.female presents for  Past Medical History:  Diagnosis Date   Anemia    Anxiety    Asthma    Chronic heartburn    Depression    Elevated cholesterol    on Crestor   Headache(784.0)    Hypertension    Hypothyroidism    S/P thyroidectomy   Migraine    Occlusion and stenosis of carotid artery with cerebral infarction    Spasm of muscle    Unspecified cerebral artery occlusion with cerebral infarction    Vitamin D deficiency      Allergies  Allergen Reactions   Penicillins Anaphylaxis and Hives   Imitrex [Sumatriptan]    Other Swelling    kiwi   Sulfa Antibiotics Hives   Zomig [Zolmitriptan]     Current Outpatient Medications on File Prior to Visit  Medication Sig   albuterol (PROVENTIL HFA) 108 (90 Base) MCG/ACT inhaler 1 to 2 inhalations 10-15 minutes apart every 4 hours if needed for asthma rescue   ALPRAZolam (XANAX) 0.5 MG tablet TAKE 1/2-1 TABLET AT BEDTIME IF NEEDED FOR SLEEP&LIMIT TO 5 DAYS/WEEK TO AVOID ADDICTION&DEMENTIA Strength: 0.5 mg   amphetamine-dextroamphetamine (ADDERALL) 10 MG tablet Take  1/2 - 1 tablet  1 or 2 x /day  for ADD   botulinum  toxin Type A (BOTOX) 100 units SOLR injection Inject 300 Units into the muscle every 3 (three) months.   buPROPion (WELLBUTRIN XL) 150 MG 24 hr tablet TAKE 1 TABLET EVERY MORNING FOR MOOD. FOCUS & CONCENTRATION   butalbital-acetaminophen-caffeine (FIORICET) 50-325-40 MG tablet TAKE 1 TABLET BY MOUTH DAILY AS NEEDED FOR HEADACHE. DO NOT REFILL IN LESS THAN 30 DAYS,   cetirizine (ZYRTEC) 10 MG tablet Take 10 mg by mouth daily.   Estradiol 10 MCG TABS vaginal tablet Place 10 mcg vaginally 2 (two) times a week.   fenofibrate micronized (LOFIBRA) 134 MG capsule TAKE 1 CAPSULE DAILY FOR TRIGLYCERIDES (BLOOD FATS)   fluticasone furoate-vilanterol (BREO ELLIPTA) 100-25 MCG/ACT AEPB Use  1 Inhalation  Daily   gabapentin (NEURONTIN) 300 MG capsule Take 1 to 2 capsules 1 hour before Bedtime as needed for Sleep   lamoTRIgine (LAMICTAL) 100 MG tablet Take 1 tablet (100 mg total) by mouth 2 (two) times daily.   levothyroxine (SYNTHROID) 88 MCG tablet TAKE 1 TABLET DAILY ON AN EMPTY STOMACH WITH ONLY WATER FOR 30 MINS& NO ANTACID MEDS, CALCIUM, OR MAGNESIUM FOR 4 HOURS. AVOID BIOTIN   magnesium oxide (MAG-OX) 400 MG tablet Take 400 mg by mouth 2 (two) times daily.    meloxicam (MOBIC) 7.5 MG tablet Take 1 tablet (7.5 mg total) by mouth 2 (two) times daily as needed for pain.   montelukast (SINGULAIR) 10 MG tablet TAKE 1 TABLET BY MOUTH AT BEDTIME FOR ALLERGIES  omeprazole (PRILOSEC) 40 MG capsule Take  1 capsule  Daily  to Prevent Heartburn & Indigestion / Patient knows to take by mouth   ondansetron (ZOFRAN-ODT) 8 MG disintegrating tablet DISSOLVE 1 TABLET UNDER TONGUE EVERY 6 TO 8 HOURS FOR NAUSEA OR VOMITTING   OVER THE COUNTER MEDICATION Key E Suppositories   Riboflavin 100 MG TABS Take 100 mg by mouth. bid   rosuvastatin (CRESTOR) 40 MG tablet TAKE 1 TABLET BY MOUTH EVERY DAY FOR CHOLESTEROL   UBRELVY 100 MG TABS TAKE 1 TABLET BY MOUTH AS NEEDED AS DIRECTED   verapamil (CALAN-SR) 240 MG CR tablet TAKE 1  TABLET DAILY WITH FOOD FOR BLOOD PRESSURE & MIGRAINE PREVENTION   vitamin B-12 (CYANOCOBALAMIN) 1000 MCG tablet Take 1,000 mcg by mouth daily.   Vitamin D, Ergocalciferol, (DRISDOL) 1.25 MG (50000 UNIT) CAPS capsule Take 1 capsule three days a week for Vitamin D deficiency.   zinc gluconate 50 MG tablet Take 50 mg by mouth daily.   Current Facility-Administered Medications on File Prior to Visit  Medication   botulinum toxin Type A (BOTOX) injection 300 Units    ROS: all negative except above.   Physical Exam:  There were no vitals taken for this visit.  General Appearance: Well nourished, in no apparent distress. Eyes: PERRLA, EOMs, conjunctiva no swelling or erythema Sinuses: No Frontal/maxillary tenderness ENT/Mouth: Ext aud canals clear, TMs without erythema, bulging. No erythema, swelling, or exudate on post pharynx.  Tonsils not swollen or erythematous. Hearing normal.  Neck: Supple, thyroid normal.  Respiratory: Respiratory effort normal, BS equal bilaterally without rales, rhonchi, wheezing or stridor.  Cardio: RRR with no MRGs. Brisk peripheral pulses without edema.  Abdomen: Soft, + BS.  Non tender, no guarding, rebound, hernias, masses. Lymphatics: Non tender without lymphadenopathy.  Musculoskeletal: Full ROM, 5/5 strength, normal gait.  Skin: Warm, dry without rashes, lesions, ecchymosis.  Neuro: Cranial nerves intact. Normal muscle tone, no cerebellar symptoms. Sensation intact.  Psych: Awake and oriented X 3, normal affect, Insight and Judgment appropriate.     Alycia Rossetti, NP 3:18 PM [facility]

## 2021-12-30 ENCOUNTER — Encounter: Payer: Self-pay | Admitting: Nurse Practitioner

## 2021-12-30 ENCOUNTER — Ambulatory Visit (INDEPENDENT_AMBULATORY_CARE_PROVIDER_SITE_OTHER): Payer: Medicare Other | Admitting: Nurse Practitioner

## 2021-12-30 VITALS — BP 108/78 | HR 100 | Temp 97.5°F | Ht 69.0 in | Wt 159.2 lb

## 2021-12-30 DIAGNOSIS — E032 Hypothyroidism due to medicaments and other exogenous substances: Secondary | ICD-10-CM

## 2021-12-30 DIAGNOSIS — R300 Dysuria: Secondary | ICD-10-CM | POA: Diagnosis not present

## 2021-12-30 MED ORDER — NITROFURANTOIN MONOHYD MACRO 100 MG PO CAPS: 100.0000 mg | ORAL_CAPSULE | Freq: Two times a day (BID) | ORAL | 0 refills | Status: AC

## 2022-01-01 LAB — URINE CULTURE
MICRO NUMBER:: 14012470
SPECIMEN QUALITY:: ADEQUATE

## 2022-01-01 LAB — URINALYSIS, ROUTINE W REFLEX MICROSCOPIC
Bacteria, UA: NONE SEEN /HPF
Bilirubin Urine: NEGATIVE
Glucose, UA: NEGATIVE
Hgb urine dipstick: NEGATIVE
Hyaline Cast: NONE SEEN /LPF
Ketones, ur: NEGATIVE
Nitrite: NEGATIVE
Protein, ur: NEGATIVE
RBC / HPF: NONE SEEN /HPF (ref 0–2)
Specific Gravity, Urine: 1.016 (ref 1.001–1.035)
pH: 6.5 (ref 5.0–8.0)

## 2022-01-01 LAB — MICROSCOPIC MESSAGE

## 2022-01-06 DIAGNOSIS — Z0289 Encounter for other administrative examinations: Secondary | ICD-10-CM

## 2022-01-07 ENCOUNTER — Telehealth: Payer: Self-pay | Admitting: Neurology

## 2022-01-07 NOTE — Telephone Encounter (Signed)
Pt called to pick up disability paperwork

## 2022-01-11 ENCOUNTER — Other Ambulatory Visit: Payer: Self-pay | Admitting: Nurse Practitioner

## 2022-01-11 DIAGNOSIS — F419 Anxiety disorder, unspecified: Secondary | ICD-10-CM

## 2022-01-11 MED ORDER — ALPRAZOLAM 0.5 MG PO TABS: ORAL_TABLET | ORAL | 0 refills | Status: DC

## 2022-01-12 DIAGNOSIS — J208 Acute bronchitis due to other specified organisms: Secondary | ICD-10-CM | POA: Diagnosis not present

## 2022-01-12 DIAGNOSIS — R059 Cough, unspecified: Secondary | ICD-10-CM | POA: Diagnosis not present

## 2022-01-12 DIAGNOSIS — J45998 Other asthma: Secondary | ICD-10-CM | POA: Diagnosis not present

## 2022-01-12 DIAGNOSIS — J40 Bronchitis, not specified as acute or chronic: Secondary | ICD-10-CM | POA: Diagnosis not present

## 2022-01-13 ENCOUNTER — Other Ambulatory Visit: Payer: Self-pay | Admitting: Neurology

## 2022-01-13 ENCOUNTER — Ambulatory Visit: Payer: Medicare Other | Admitting: Neurology

## 2022-01-13 VITALS — BP 128/83 | HR 98

## 2022-01-13 DIAGNOSIS — G40209 Localization-related (focal) (partial) symptomatic epilepsy and epileptic syndromes with complex partial seizures, not intractable, without status epilepticus: Secondary | ICD-10-CM

## 2022-01-13 DIAGNOSIS — I69954 Hemiplegia and hemiparesis following unspecified cerebrovascular disease affecting left non-dominant side: Secondary | ICD-10-CM

## 2022-01-13 DIAGNOSIS — G43709 Chronic migraine without aura, not intractable, without status migrainosus: Secondary | ICD-10-CM

## 2022-01-13 DIAGNOSIS — R269 Unspecified abnormalities of gait and mobility: Secondary | ICD-10-CM

## 2022-01-13 MED ORDER — AJOVY 225 MG/1.5ML ~~LOC~~ SOAJ: 225.0000 mg | SUBCUTANEOUS | 11 refills | Status: DC

## 2022-01-13 MED ORDER — ONABOTULINUMTOXINA 100 UNITS IJ SOLR: 300.0000 [IU] | Freq: Once | INTRAMUSCULAR | Status: DC

## 2022-01-13 NOTE — Progress Notes (Signed)
ASSESSMENT AND PLAN  Ruth Gregory is a 61 y.o. female   Chronic migraine headaches  Increased migraine headache recently  Ubrevyl 100 mg as needed works well  Add on [employer] montly  History of right internal carotid artery dissection from severe vomiting related to migraine headache, right MCA stroke in June 2009  Probable complex partial seizure EEG in November 2022 showed right frontotemporal focal slowing, Reported most recent episode was on January 18, 2021, no driving until seizure-free for 6 months She did not have similar episode while taking Topamax in the past (as migraine prevention) Doing much better with lamotrigine 100 mg twice daily, which also helped her mood    Spastic left hemiparesis following right internal carotid artery dissection, right MCA stroke in June 2000 Referral to physical therapy   Botulism toxin injection every 3 months for spastic right hemiparesis injection  Under electric stimulation, injected Botox A 300 units  Left pronator teres 25 units Left flexor digitorum profundus 25 units Left flexor digitorum superficialis 25 units Left palmaris longus 25 units  Left levator scapular 25 units Left upper trapezius 25 units Left iliocostalis 25 units Left splenius cervix 25 units  Left flexor digitorum longus 50 units Left tibialis posterior 50 units   DIAGNOSTIC DATA (LABS, IMAGING, TESTING) - I reviewed patient records, labs, notes, testing and imaging myself where available. HISTORICAL  Ruth PeonKim Kingston Persons is a 61 year old right-handed female  History of right internal carotid section with spastic left hemiparesis, Botox injection every 3 months  She has history of right internal carotid artery dissection following an episode of vomiting from migraine headache in mid June 2009. She was given IV TPA and found to have right MCA occlusion. She underwent emergent right carotid artery stent with distal endovascular recanalization of the  middle cerebral artery.  She had small hemorrhagic transformation of the right basal ganglia with mild left hemiparesis, but has done well since then and has been independent.  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  She has made marked recovery, ambulating only with very mild difficulty,  maintain majority of her left arm function,  there is mild limitation in the range of motion in her left shoulder,  mild left shoulder stiffness and pain, most bothersome symptoms is her left elbow discomfort, left wrist achy pain, difficulty releasing left hand flexion, left arm persistent flexion,  left ankle plantar inversion, increased gait difficulty after prolonged walking, This has all been helped by Botox injection.  She has been receiving BOTOX injection since 01/2009 every 3 months, to her left upper extremity and also left lower extremity, responded well.   Repeat US carotid were normal in October 2013  Chronic migraine headaches: She had long-standing history of chronic migraine, average 2-4 headaches each months, she has increased frequency headaches around fall, and spring, she is now taking Topamax ER 150 mg every night as preventive medication, magnesium oxide 400 mg, riboflavin 100 mg twice a day,  She is not candidate for triptan because previous history of stroke, previously Cambia works well for her, she her insurance only allow her to have 9 tablets each months. Occasionally she take Fioricet, hydrocodone as needed, even prednisone 20 mg as needed as rescue therapy  Sometimes she received Botox injection as migraine prevention, previously taking Topamax for many years, there was no reported seizure-like spells,  Her migraine eventually responded very well to [place] needed, she tapered off Topamax few month ago, since May, she reported  recurrent 2 episode of sudden onset transient confusion, most recent episode was on January 18, 2021, while driving, she began to turn towards  the left side, had transient confusion, visual distortion, likely there was no incident, there was no seizure-like activity  We personally reviewed CT head in June 2009, right MCA infarction stable hemorrhagic component, right to left shift  UPDATE Apr 06 2021: She complains of increased headache over the past few months, likely attributed to the stress from holiday, and also weather changes, [name] continue to work, she takes about twice a week  She has no recurrent confusion spells, but did not titrating up lamotrigine dosage as discussed during last office visit,  EEG in November 2022 showed intermittent protrusion of slower wave dysrhythmic involving right frontotemporal region, no epileptiform discharge, but suggest focal irritability, consistent with her history of right MCA stroke  She also complains of mild memory loss, difficulty focusing, difficulty multitasking,  UPDATE  Apr 29 2021: She is overall doing well, tolerating titrating dose of lamotrigine, which has helped her mood, no longer has confusion spells, migraine headache overall is under good control, return for botulism toxin injection for spastic left upper and lower extremity   Update July 23, 2021: She tolerated lamotrigine 100 mg twice daily well, no recurrent seizure-like spells, also helped her mood, Migraine is overall under good control  UPDATE July 28th 2023: Heavy object fell on her left foot, broke her left toe, wearing left of boot,  Overall her headache is under good control, intermittent left hand muscle 7,'  Update January 13, 2022: She complains of increased headache, couple times each week, Ubrelvy 100 mg as needed works well, also complains of increased anxiety, difficulty sleeping, was given Ativan, complains of excessive drowsiness next day, now started on gabapentin  Slow worsening unsteady gait, wish to receive physical therapy left ankle tendency for plantarflexion PHYSICAL EXAM   Vitals:    12/26/19 1427  BP: 140/84  Pulse: 92  Weight: 162 lb (73.5 kg)  Height: 5' 8.5" (1.74 m)   Body mass index is 24.27 kg/m.  Gen: NAD, conversant, well nourised, well groomed      NEUROLOGICAL EXAM:  MENTAL STATUS: Speech/Cognition: Awake, alert, normal speech, oriented to history taking and casual conversation.  CRANIAL NERVES: CN II: Visual fields are full to confrontation.  Pupils are round equal and briskly reactive to light. CN III, IV, VI: extraocular movement are normal. No ptosis. CN V: Facial sensation is intact to light touch. CN VII: Face is symmetric with normal eye closure and smile. CN VIII: Hearing is normal to casual conversation CN IX, X: Palate elevates symmetrically. Phonation is normal. CN XI: Head turning and shoulder shrug are intact  MOTOR: Mild spastic left hemiparesis, tendency for left finger flexion, left arm pronation and left ankle plantarflexion  COORDINATION: There is no trunk or limb ataxia.    GAIT/STANCE: Mildly unsteady gait, tendency for left ankle plantarflexion  Review of system: Pertinent as above   ALLERGIES: Allergies  Allergen Reactions   Penicillins Anaphylaxis and Hives   Imitrex [Sumatriptan]    Other Swelling    kiwi   Sulfa Antibiotics Hives   Zomig [Zolmitriptan]     HOME MEDICATIONS: Current Outpatient Medications  Medication Sig Dispense Refill   albuterol (PROVENTIL HFA) 108 (90 Base) MCG/ACT inhaler 1 to 2 inhalations 10-15 minutes apart every 4 hours if needed for asthma rescue 48 g 3   ALPRAZolam (XANAX) 0.5 MG tablet Take 1/2-1 tablet at Bedtime  ONLY if needed for Sleep 30 tablet 0   amphetamine-dextroamphetamine (ADDERALL) 10 MG tablet Take 1/2 to 1 tablet 1 or 2 x daily for ADD 60 tablet 0   aspirin EC 81 MG tablet Take 81 mg by mouth daily.     augmented betamethasone dipropionate (DIPROLENE-AF) 0.05 % cream Apply topically 2 (two) times daily as needed.     Botulinum Toxin Type A (BOTOX) 200 UNITS SOLR  Inject 400 Units as directed every 3 (three) months. Change to BOTOX A 100  units     BREO ELLIPTA 100-25 MCG/INH AEPB 1 INHALATION DAILY TO PREVENT ASTHMA 180 each 3   buPROPion (WELLBUTRIN XL) 150 MG 24 hr tablet TAKE 1 TABLET EVERY MORNING FOR MOOD. FOCUS & CONCENTRATION 30 tablet 2   butalbital-acetaminophen-caffeine (FIORICET) 50-325-40 MG tablet Take 1 tablet by mouth daily as needed for headache. Do not refill in less than 30 days, 12 tablet 5   cetirizine (ZYRTEC) 10 MG tablet Take 10 mg by mouth daily.     dexamethasone (DECADRON) 4 MG tablet Take 1 tab 3 x day - 3 days, then 2 x day - 3 days, then 1 tab daily 20 tablet 0   Diclofenac Potassium,Migraine, (CAMBIA) 50 MG PACK MIX 1 PACK WITH 1-2 OUNCES OF WATER AND DRINK AT ONSET OF MIGRAINE CAN REPEAT IN 2 HOURS IF NEEDED MAX 2/24 HOURS 8 each 11   diclofenac Sodium (VOLTAREN) 1 % GEL Apply 4 g topically 4 (four) times daily as needed. 500 g 6   Estradiol (YUVAFEM) 10 MCG TABS vaginal tablet Place 10 mcg vaginally 2 (two) times a week.     fenofibrate micronized (LOFIBRA) 134 MG capsule TAKE 1 CAPSULE EVERY DAY 90 capsule 3   levothyroxine (SYNTHROID) 88 MCG tablet Take 1 tablet daily on an empty stomach with only water for 30 minutes & no Antacid meds, Calcium or Magnesium for 4 hours & avoid Biotin 90 tablet 0   magnesium oxide (MAG-OX) 400 MG tablet Take 400 mg by mouth 2 (two) times daily.      montelukast (SINGULAIR) 10 MG tablet Take 1 tablet Daily  for Allergies. 90 tablet 3   Multiple Vitamin (MULTIVITAMIN WITH MINERALS) TABS tablet Take 1 tablet by mouth daily.     Multiple Vitamins-Minerals (ZINC PO) Take 1 tablet by mouth daily.     nortriptyline (PAMELOR) 25 MG capsule Take 25 mg by mouth at bedtime.     omeprazole (PRILOSEC) 40 MG capsule TAKE 1 CAPSULE DAILY FOR ACID INDIGESTION & REFLUX 90 capsule 3   ondansetron (ZOFRAN ODT) 8 MG disintegrating tablet Dissolve 1 tablet under tongue every 6 to 8 hours for nausea  or vomitting  30 tablet 0   Riboflavin 100 MG TABS Take 100 mg by mouth. bid     rosuvastatin (CRESTOR) 40 MG tablet Take 1 tablet (40 mg total) by mouth daily. Take 1 tablet Daily for Cholesterol 90 tablet 3   topiramate (TOPAMAX) 100 MG tablet Take 2 tablets (200 mg total) by mouth daily. 180 tablet 3   verapamil (CALAN-SR) 240 MG CR tablet TAKE 1 TABLET DAILY WITH FOOD FOR BP & MIGRAINE PREVENTION 90 tablet 3   vitamin B-12 (CYANOCOBALAMIN) 1000 MCG tablet Take 1,000 mcg by mouth daily.     Vitamin D, Ergocalciferol, (DRISDOL) 1.25 MG (50000 UT) CAPS capsule Take 1 capsule daily for Vitamin D deficiency. 90 capsule 1   Erenumab-aooe (AIMOVIG) 70 MG/ML SOAJ Inject 70 mg into the skin every 30 (thirty)  days. 1 mL 11   Ubrogepant (UBRELVY) 50 MG TABS Take 50 mg by mouth as needed. May repeat once in 2 hours 12 tablet 11   No current facility-administered medications for this visit.    PAST MEDICAL HISTORY: Past Medical History:  Diagnosis Date   Anemia    Anxiety    Asthma    Chronic heartburn    Depression    Elevated cholesterol    on Crestor   Headache(784.0)    Hypertension    Hypothyroidism    S/P thyroidectomy   Migraine    Occlusion and stenosis of carotid artery with cerebral infarction    Spasm of muscle    Unspecified cerebral artery occlusion with cerebral infarction    Vitamin D deficiency     PAST SURGICAL HISTORY: Past Surgical History:  Procedure Laterality Date   AUGMENTATION MAMMAPLASTY Bilateral 2000   COSMETIC SURGERY     CYSTOCELE REPAIR N/A 01/08/2013   Procedure: ANTERIOR REPAIR (CYSTOCELE);  Surgeon: Meriel Pica, MD;  Location: [facility];  Service: Gynecology;  Laterality: N/A;   NASAL SINUS SURGERY     VAGINAL HYSTERECTOMY      FAMILY HISTORY: Family History  Problem Relation Age of Onset   Heart disease Mother    Peptic Ulcer Disease Mother    Heart disease Father    Parkinson's disease Father     SOCIAL HISTORY:  Social History   Socioeconomic  History   Marital status: Married    Spouse name: Brett Canales   Number of children: 2   Years of education: college   Highest education level: Not on file  Occupational History   Occupation: DESIGNER    Employer: [employer]  Tobacco Use   Smoking status: Never Smoker   Smokeless tobacco: Never Used  Substance and Sexual Activity   Alcohol use: Yes    Comment: occassionally drinks a glass of wine   Drug use: No   Sexual activity: Not on file  Other Topics Concern   Not on file  Social History Narrative   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 Patient is married and lives with her husband. Patient has college education.   Right handed.   Caffeine- two cups daily.   Social Determinants of Health   Financial Resource Strain:    Difficulty of Paying Living Expenses: Not on file  Food Insecurity:    Worried About Programme researcher, broadcasting/film/video in the Last Year: Not on file   [employer] of Food in the Last Year: Not on file  Transportation Needs:    Lack of Transportation (Medical): Not on file   Lack of Transportation (Non-Medical): Not on file  Physical Activity:    Days of Exercise per Week: Not on file   Minutes of Exercise per Session: Not on file  Stress:    Feeling of Stress : Not on file  Social Connections:    Frequency of Communication with Friends and Family: Not on file   Frequency of Social Gatherings with Friends and Family: Not on file   Attends Religious Services: Not on file   Active Member of Clubs or Organizations: Not on file   Attends Banker Meetings: Not on file   Marital Status: Not on file  Intimate Partner Violence:    Fear of Current or Ex-Partner: Not on file   Emotionally Abused: Not on file   Physically Abused: Not on file   Sexually Abused: Not on file  Levert Feinstein, M.D. Ph.D.  [facility] [address] [place], [place] [address] Ph: [phone] Fax: [phone]

## 2022-01-14 ENCOUNTER — Ambulatory Visit: Payer: Medicare Other | Admitting: Orthopaedic Surgery

## 2022-01-14 ENCOUNTER — Encounter: Payer: Self-pay | Admitting: Orthopaedic Surgery

## 2022-01-14 ENCOUNTER — Ambulatory Visit (INDEPENDENT_AMBULATORY_CARE_PROVIDER_SITE_OTHER): Payer: Medicare Other

## 2022-01-14 DIAGNOSIS — M542 Cervicalgia: Secondary | ICD-10-CM

## 2022-01-14 MED ORDER — PREDNISONE 10 MG (21) PO TBPK: ORAL_TABLET | ORAL | 0 refills | Status: DC

## 2022-01-14 NOTE — Progress Notes (Signed)
Office Visit Note   Patient: Ruth Gregory           Date of Birth: October 17, 1960           MRN: [id number] Visit Date: 01/14/2022              Requested by: Unk Pinto, [address] [place],  [place] [address] PCP: Unk Pinto, MD   Assessment & Plan: Visit Diagnoses:  1. Neck pain     Plan: Impression is chronic neck pain with underlying cervical spine osteoarthritis.  At this point, I recommended starting on a steroid pack and sending her to outpatient physical therapy for which she is agreeable to.  She will follow-up with [place] if her symptoms do not improve or if they happen to worsen.  Call with concerns or questions.  Follow-Up Instructions: Return if symptoms worsen or fail to improve.   Orders:  Orders Placed This Encounter  Procedures   XR Cervical Spine 2 or 3 views   Ambulatory referral to Physical Therapy   Meds ordered this encounter  Medications   predniSONE (STERAPRED UNI-PAK 21 TAB) 10 MG (21) TBPK tablet    Sig: Take as directed    Dispense:  21 tablet    Refill:  0      Procedures: No procedures performed   Clinical Data: No additional findings.   Subjective: Chief Complaint  Patient presents with   Neck - Pain    HPI patient is a pleasant 61 year old female who comes in today with neck pain for the past 6 months.  The pain she has is described as a chronic nagging down the middle of her neck.  Symptoms appear to be worse when she is lying down at night.  She denies any pain or new radicular symptoms to either upper extremity.  She is status post left-sided hemiparalysis from a CVA 15 years ago.  She is not taking any medication for the pain in her neck.  Review of Systems as detailed in HPI.  All others reviewed are negative.   Objective: Vital Signs: There were no vitals taken for this visit.  Physical Exam well-developed well-nourished female no acute distress.  Alert and oriented x3.  Ortho Exam cervical  spine exam shows no spinous or paraspinous tenderness.  Painless range of motion of her neck.    Specialty Comments:  No specialty comments available.  Imaging: XR Cervical Spine 2 or 3 views  Result Date: 01/14/2022 X-rays demonstrate moderate multilevel degenerative changes with advanced changes C5-6 and C6-7    PMFS History: Patient Active Problem List   Diagnosis Date Noted   Gait abnormality 01/13/2022   Closed displaced fracture of proximal phalanx of lesser toe, initial encounter 10/21/2021   Squamous cell carcinoma in situ (SCCIS) of dorsum of left hand 09/11/2021   Arthritis of carpometacarpal [facility]) joint of right thumb 08/11/2021   Degenerative arthritis of distal interphalangeal joint of index finger of right hand 08/11/2021   Degenerative arthritis of distal interphalangeal joint of middle finger of right hand 08/11/2021   Degenerative arthritis of distal interphalangeal joint of ring finger of right hand 08/11/2021   Partial symptomatic epilepsy with complex partial seizures, not intractable, without status epilepticus ([place]) 01/21/2021   B12 deficiency 09/18/2020   Chronic migraine w/o aura w/o status migrainosus, not intractable 04/15/2020   Spastic hemiplegia of left nondominant side as late effect of cerebrovascular disease ([employer]) 06/18/2019   Abnormal glucose 07/27/2018   Labile hypertension  07/27/2018   Fatty liver 10/09/2017   GERD (gastroesophageal reflux disease) 10/07/2017   Anxiety 10/07/2017   Hypothyroidism 07/23/2015   Vitamin D deficiency 10/03/2013   Medication management 10/03/2013   History of prediabetes 10/03/2013   ADD (attention deficit disorder) without hyperactivity 10/03/2013   Hyperlipidemia, mixed 04/04/2013   Sinusitis, chronic 04/04/2013   Chronic migraine 09/06/2012   Spastic hemiplegia affecting left nondominant side (HCC) 09/06/2012   History of CVA (cerebrovascular accident)    Past Medical History:  Diagnosis Date   Anemia     Anxiety    Asthma    Chronic heartburn    Depression    Elevated cholesterol    on Crestor   Headache(784.0)    Hypertension    Hypothyroidism    S/P thyroidectomy   Migraine    Occlusion and stenosis of carotid artery with cerebral infarction    Spasm of muscle    Unspecified cerebral artery occlusion with cerebral infarction    Vitamin D deficiency     Family History  Problem Relation Age of Onset   Heart disease Mother    Peptic Ulcer Disease Mother    Heart disease Father    Parkinson's disease Father     Past Surgical History:  Procedure Laterality Date   AUGMENTATION MAMMAPLASTY Bilateral 2000   COSMETIC SURGERY     CYSTOCELE REPAIR N/A 01/08/2013   Procedure: ANTERIOR REPAIR (CYSTOCELE);  Surgeon: Meriel Pica, MD;  Location: [facility];  Service: Gynecology;  Laterality: N/A;   NASAL SINUS SURGERY     VAGINAL HYSTERECTOMY     Social History   Occupational History   Occupation: DESIGNER    Employer: [employer]  Tobacco Use   Smoking status: Never   Smokeless tobacco: Never  Substance and Sexual Activity   Alcohol use: Yes    Comment: occassionally drinks a glass of wine   Drug use: No   Sexual activity: Not on file

## 2022-01-18 ENCOUNTER — Telehealth: Payer: Self-pay | Admitting: Orthopaedic Surgery

## 2022-01-18 NOTE — Telephone Encounter (Signed)
Referral changed to neurorehab, per patient request. She already has an appt there for PT eval on 01/26/22.

## 2022-01-18 NOTE — Telephone Encounter (Signed)
Pt called requesting her physical therapy referral be sent to [facility] on [facility]. Pt phone number is [phone].

## 2022-01-26 ENCOUNTER — Ambulatory Visit: Payer: Medicare Other | Admitting: Physical Therapy

## 2022-01-29 ENCOUNTER — Telehealth: Payer: Self-pay | Admitting: *Deleted

## 2022-01-29 ENCOUNTER — Ambulatory Visit: Payer: Medicare Other | Admitting: Orthopaedic Surgery

## 2022-01-29 ENCOUNTER — Encounter: Payer: Self-pay | Admitting: *Deleted

## 2022-01-29 NOTE — Patient Outreach (Signed)
  Care Coordination   01/29/2022 Name: Ruth Gregory MRN: [id number] DOB: 1960-10-14   Care Coordination Outreach Attempts:  A third unsuccessful outreach was attempted today to offer the patient with information about available care coordination services as a benefit of their health plan.   Follow Up Plan:  No further outreach attempts will be made at this time. We have been unable to contact the patient to offer or enroll patient in care coordination services  Encounter Outcome:  No Answer  Care Coordination Interventions Activated:  No   Care Coordination Interventions:  No, not indicated   Eduard Clos MSW, LCSW Licensed Clinical Social Worker      [phone]

## 2022-02-01 ENCOUNTER — Ambulatory Visit: Payer: Medicare Other | Attending: Neurology | Admitting: Physical Therapy

## 2022-02-01 ENCOUNTER — Telehealth: Payer: Self-pay | Admitting: Physical Therapy

## 2022-02-01 ENCOUNTER — Other Ambulatory Visit: Payer: Self-pay | Admitting: Internal Medicine

## 2022-02-01 ENCOUNTER — Other Ambulatory Visit: Payer: Self-pay | Admitting: Nurse Practitioner

## 2022-02-01 ENCOUNTER — Other Ambulatory Visit: Payer: Self-pay

## 2022-02-01 ENCOUNTER — Encounter: Payer: Self-pay | Admitting: Physical Therapy

## 2022-02-01 ENCOUNTER — Ambulatory Visit: Payer: Medicare Other

## 2022-02-01 VITALS — BP 133/91 | HR 84

## 2022-02-01 DIAGNOSIS — G40209 Localization-related (focal) (partial) symptomatic epilepsy and epileptic syndromes with complex partial seizures, not intractable, without status epilepticus: Secondary | ICD-10-CM | POA: Insufficient documentation

## 2022-02-01 DIAGNOSIS — G43709 Chronic migraine without aura, not intractable, without status migrainosus: Secondary | ICD-10-CM | POA: Insufficient documentation

## 2022-02-01 DIAGNOSIS — I639 Cerebral infarction, unspecified: Secondary | ICD-10-CM

## 2022-02-01 DIAGNOSIS — R2689 Other abnormalities of gait and mobility: Secondary | ICD-10-CM | POA: Insufficient documentation

## 2022-02-01 DIAGNOSIS — M542 Cervicalgia: Secondary | ICD-10-CM | POA: Insufficient documentation

## 2022-02-01 DIAGNOSIS — R2681 Unsteadiness on feet: Secondary | ICD-10-CM | POA: Diagnosis not present

## 2022-02-01 DIAGNOSIS — I69954 Hemiplegia and hemiparesis following unspecified cerebrovascular disease affecting left non-dominant side: Secondary | ICD-10-CM | POA: Diagnosis not present

## 2022-02-01 DIAGNOSIS — M6281 Muscle weakness (generalized): Secondary | ICD-10-CM

## 2022-02-01 DIAGNOSIS — R296 Repeated falls: Secondary | ICD-10-CM | POA: Diagnosis not present

## 2022-02-01 NOTE — Telephone Encounter (Signed)
Dr. Krista Blue, Shelle Iron was evaluated by physical therapy on 02/01/2022.  The patient would benefit from referral for OT evaluation for ongoing difficulties with functional LUE use due to sensation deficits and lingering spasticity.   If you agree, please place an order in [facility] workque in [facility] or fax the order to [phone]. Thank you, Elease Etienne, PT, [place] [address] [place], [place]  [address] Phone:  [phone] Fax:  [phone]

## 2022-02-01 NOTE — Therapy (Signed)
OUTPATIENT PHYSICAL THERAPY NEURO EVALUATION   Patient Name: Ruth Gregory MRN: [id number] DOB:1960-12-27, 61 y.o., female Today's Date: 02/01/2022   PCP: Unk Pinto, MD REFERRING PROVIDER: Marcial Pacas, MD   PT End of Session - 02/01/22 1208     Visit Number 1    Number of Visits 11   10+eval   Date for PT Re-Evaluation 04/16/22    Authorization Type UHC MEDICARE    Progress Note Due on Visit 10    PT Start Time 1148    PT Stop Time 1238    PT Time Calculation (min) 50 min    Activity Tolerance Patient tolerated treatment well    Behavior During Therapy WFL for tasks assessed/performed             Past Medical History:  Diagnosis Date   Anemia    Anxiety    Asthma    Chronic heartburn    Depression    Elevated cholesterol    on Crestor   Headache(784.0)    Hypertension    Hypothyroidism    S/P thyroidectomy   Migraine    Occlusion and stenosis of carotid artery with cerebral infarction    Spasm of muscle    Unspecified cerebral artery occlusion with cerebral infarction    Vitamin D deficiency    Past Surgical History:  Procedure Laterality Date   AUGMENTATION MAMMAPLASTY Bilateral 2000   COSMETIC SURGERY     CYSTOCELE REPAIR N/A 01/08/2013   Procedure: ANTERIOR REPAIR (CYSTOCELE);  Surgeon: Margarette Asal, MD;  Location: [facility];  Service: Gynecology;  Laterality: N/A;   NASAL SINUS SURGERY     VAGINAL HYSTERECTOMY     Patient Active Problem List   Diagnosis Date Noted   Gait abnormality 01/13/2022   Closed displaced fracture of proximal phalanx of lesser toe, initial encounter 10/21/2021   Squamous cell carcinoma in situ (SCCIS) of dorsum of left hand 09/11/2021   Arthritis of carpometacarpal [facility]) joint of right thumb 08/11/2021   Degenerative arthritis of distal interphalangeal joint of index finger of right hand 08/11/2021   Degenerative arthritis of distal interphalangeal joint of middle finger of right hand 08/11/2021   Degenerative  arthritis of distal interphalangeal joint of ring finger of right hand 08/11/2021   Partial symptomatic epilepsy with complex partial seizures, not intractable, without status epilepticus ([place]) 01/21/2021   B12 deficiency 09/18/2020   Chronic migraine w/o aura w/o status migrainosus, not intractable 04/15/2020   Spastic hemiplegia of left nondominant side as late effect of cerebrovascular disease ([place]) 06/18/2019   Abnormal glucose 07/27/2018   Labile hypertension 07/27/2018   Fatty liver 10/09/2017   GERD (gastroesophageal reflux disease) 10/07/2017   Anxiety 10/07/2017   Hypothyroidism 07/23/2015   Vitamin D deficiency 10/03/2013   Medication management 10/03/2013   History of prediabetes 10/03/2013   ADD (attention deficit disorder) without hyperactivity 10/03/2013   Hyperlipidemia, mixed 04/04/2013   Sinusitis, chronic 04/04/2013   Chronic migraine 09/06/2012   Spastic hemiplegia affecting left nondominant side ([place]) 09/06/2012   History of CVA (cerebrovascular accident)     ONSET DATE: 01/13/2022 (referral)  REFERRING DIAG: Q65.784 (ICD-10-CM) - Spastic hemiplegia of left nondominant side as late effect of cerebrovascular disease, unspecified cerebrovascular disease type ([place]) G43.709 (ICD-10-CM) - Chronic migraine w/o aura w/o status migrainosus, not intractable G40.209 (ICD-10-CM) - Partial symptomatic epilepsy with complex partial seizures, not intractable, without status epilepticus ([place]) M54.2 (ICD-10-CM) - Neck pain  THERAPY DIAG:  Other abnormalities of gait and mobility -  Plan: PT plan of care cert/re-cert  Muscle weakness (generalized) - Plan: PT plan of care cert/re-cert  Unsteadiness on feet - Plan: PT plan of care cert/re-cert  Repeated falls - Plan: PT plan of care cert/re-cert  Cervicalgia - Plan: PT plan of care cert/re-cert  Rationale for Evaluation and Treatment: Rehabilitation  SUBJECTIVE:                                                                                                                                                                                              SUBJECTIVE STATEMENT: Pt states she has been having neck pain and that she has had repeated falls recently.  She denies hitting her head with falls.  She cannot feel much on the left side and wears a custom shoe orthotic (soft insert-made by [facility] sports [employer]) in the left shoe to prevent over pronation.  She further reports that she dropped a vase of rocks on her left foot several weeks ago as it slipped out of her left hand.  This resulted in her breaking her second toe on her left foot.  She reports this is healing and she has a follow-up in the coming weeks. 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  Compliant to med schedule to manage this and has returned to driving.  Botox to left hemibody 10/18.   PAIN:  Are you having pain? No  PRECAUTIONS: Cervical and Fall-pt has stent in right internal carotid  WEIGHT BEARING RESTRICTIONS: No  FALLS: Has patient fallen in last 6 months? Yes. Number of falls 2-tripped over laundry basket and does not recall the second  LIVING ENVIRONMENT: Lives with: lives with their spouse Lives in: House/apartment Stairs: Yes: Internal: 16 steps; on right going up and External: 3 steps; none Has following equipment at home: Grab bars and raised commode seat and removable show head  PLOF: Independent  PATIENT GOALS: "To feel more comfortable about not falling."  OBJECTIVE:   DIAGNOSTIC FINDINGS: Cervical X-rays 10/19X-rays demonstrate moderate multilevel degenerative changes with advanced changes C5-6 and C6-7  COGNITION: Overall  cognitive status: Within functional limits for tasks assessed and pt hyperverbal   SENSATION: Light touch: Impaired -baseline dec sensation on LLE/LUE, pt able to identify areas touched but reports difference side-to-side  COORDINATION: LE RAMS:  WFL Heel-to-shin:  WFL  EDEMA:  None noted in BLE  MUSCLE TONE:  left hemibody spasticity managed w/ botox, left UE worse than LE  POSTURE:  Left shoulder sits below right  LOWER EXTREMITY ROM:     Active  Right Eval Left Eval  Hip flexion [facility]  Hip extension    Hip abduction    Hip adduction    Hip internal rotation    Hip external rotation    Knee flexion " "  Knee extension " "  Ankle dorsiflexion " "  Ankle plantarflexion    Ankle inversion    Ankle eversion     (Blank rows = not tested)  LOWER EXTREMITY MMT:    MMT Right Eval Left Eval  Hip flexion 4+/5 4+/5  Hip extension    Hip abduction    Hip adduction    Hip internal rotation    Hip external rotation    Knee flexion 4+/5 4+/5  Knee extension 5/5 5/5  Ankle dorsiflexion 5/5 4+/5  Ankle plantarflexion    Ankle inversion    Ankle eversion    (Blank rows = not tested)  BED MOBILITY:  Pt reports independence  TRANSFERS: Assistive device utilized: None  Sit to stand: Complete Independence Stand to sit: Complete Independence Chair to chair: Complete Independence Floor: Complete Independence and one instance of maxA per pt report  STAIRS: Level of Assistance: Modified independence Stair Negotiation Technique: Alternating Pattern  Forwards with Single Rail on Right Number of Stairs: 4  Height of Stairs: 6"   GAIT: Gait pattern:  mild left foot overpronation in stance, step through pattern, decreased arm swing- Left, and narrow BOS Distance walked: various clinic distances Assistive device utilized: None Level of assistance: SBA Comments: Pt has increasing left arm flexion with increased distance/speed, able to be corrected w/ cuing.  FUNCTIONAL  TESTS:  10 meter walk test: 8.75 sec = 1.14 m/sec OR 3.77 ft/sec Functional gait assessment: 20/30  [facility] PT Assessment - 02/01/22 1238       Functional Gait  Assessment   Gait assessed  Yes    Gait Level Surface Walks 20 ft in less than 7 sec but greater than 5.5 sec, uses assistive device, slower speed, mild gait deviations, or deviates 6-10 in outside of the 12 in walkway width.    Change in Gait Speed Able to smoothly change walking speed without loss of balance or gait deviation. Deviate no more than 6 in outside of the 12 in walkway width.    Gait with Horizontal Head Turns Performs head turns smoothly with slight change in gait velocity (eg, minor disruption to smooth gait path), deviates 6-10 in outside 12 in walkway width, or uses an assistive device.    Gait with Vertical Head Turns Performs task with slight change in gait velocity (eg, minor disruption to smooth gait path), deviates 6 - 10 in outside 12 in walkway width or uses assistive device    Gait and Pivot Turn Pivot turns safely in greater than 3 sec and stops with no loss of balance, or pivot turns safely within 3 sec and stops with mild imbalance, requires small steps to catch balance.    Step Over Obstacle Is able to step over one shoe box (4.5 in total height) but must slow down and adjust steps to clear box safely. May require verbal cueing.    Gait with Narrow Base of Support Ambulates 7-9 steps.    Gait with Eyes Closed Walks 20 ft, uses assistive device, slower speed, mild gait deviations, deviates 6-10 in outside 12 in  walkway width. Ambulates 20 ft in less than 9 sec but greater than 7 sec.    Ambulating Backwards Walks 20 ft, uses assistive device, slower speed, mild gait deviations, deviates 6-10 in outside 12 in walkway width.    Steps Alternating feet, must use rail.    Total Score 20            PATIENT SURVEYS:  FOTO N/A due to chronicity of CVA  TODAY'S TREATMENT:                                                                                                                               DATE: N/A   PATIENT EDUCATION: Education details: PT POC, assessments completed and to be completed, and goals to be set. Person educated: Patient Education method: Explanation Education comprehension: verbalized understanding  HOME EXERCISE PROGRAM: To be established.  GOALS: Goals reviewed with patient? Yes  SHORT TERM GOALS: Target date: 03/05/2022  Pt will be independent with initial strength and balance HEP to maintain gains outside the clinic. Baseline:  To be established. Goal status: INITIAL  2.  NDI to be assessed w/ goal set as appropriate. Baseline: To be assessed. Goal status: INITIAL  3.  Pt will be able to verbalize fall prevention strategies and will demonstrate independence with floor recovery. Baseline: Pt reports independence, PT yet to witness. Goal status: INITIAL  3.  Cervical deep neck flexor test to be assessed w/ goal set as appropriate. Baseline: To be assessed. Goal status: INITIAL  LONG TERM GOALS: Target date: 04/02/2021 (8 weeks); update for 12 weeks at assessment (04/16/2022)  Pt will be independent w/ finalized strength and balance and cervical mobility HEP to further improve mobility and pain. Baseline:  To be updated. Goal status: INITIAL  2.  Pt will improve FGA score to >/=25/30 in order to demonstrate improved balance and decreased fall risk. Baseline: 20/30 Goal status: INITIAL  3.  Pt will reports </=5 days of migraines per month to demonstrate subjective improvement in function. Baseline: reports 8-10 migraines per month Goal status: INITIAL  4.  NDI to be assessed w/ LTG set as appropriate. Baseline: To be assessed. Goal status: INITIAL  5.  Pt will ambulate up and down 16 stairs modI level w/ reciprocal or step-to gait without LOB to improve safe mobility in home environment. Baseline: Pt can go reciprocally w/ right rail, but has lost balance at  home several times resulting in a previous fall. Goal status: INITIAL  ASSESSMENT:  CLINICAL IMPRESSION: Patient is a 61 y.o. female who was seen today for physical therapy evaluation and treatment for chronic deficits related to CVA affecting left hemibody, cervical pain, and migraine.   Pt has a significant PMH of Fuch's disease, chronic migraine, right MCA infarction and right internal carotid dissection (2009) resulting in spastic hemiplegia of left nondominant side treated w/ Botox, HLD, anxiety, and epilepsy with complex partial seizure.  Identified impairments include decreased left  hemibody sensation, left spasticity well managed with botox worse in the left UE, mild generalized LE weakness, instability w/ stair negotiation, mild left foot over pronation and decreased left arm swing with gait, and decreased confidence and stability when turning.  Evaluation via the following assessment tools: FGA indicates a moderate fall risk scoring 20/30.  Her gait speed is WNL at 3.77 ft/sec.  She did not endorse cervical pain this visit, but states this is frequent at home, nor did she have a migraine at present today.  Cervical assessment to be done at next visit for further goals to be set with adequate evaluation of complaint.  She would benefit from skilled PT to address impairments as noted and progress towards long term goals.  OBJECTIVE IMPAIRMENTS: Abnormal gait, decreased balance, decreased knowledge of use of DME, decreased strength, impaired sensation, impaired tone, impaired UE functional use, improper body mechanics, and postural dysfunction.   ACTIVITY LIMITATIONS: carrying, lifting, stairs, and locomotion level  PARTICIPATION LIMITATIONS: community activity  PERSONAL FACTORS: Age, Fitness, Past/current experiences, Time since onset of injury/illness/exacerbation, and 1-2 comorbidities: anxiety and Fuch's disease  are also affecting patient's functional outcome.   REHAB POTENTIAL:  Excellent  CLINICAL DECISION MAKING: Evolving/moderate complexity  EVALUATION COMPLEXITY: Moderate  PLAN:  PT FREQUENCY: 1x/week  PT DURATION: 10 weeks  PLANNED INTERVENTIONS: Therapeutic exercises, Therapeutic activity, Neuromuscular re-education, Balance training, Gait training, Patient/Family education, Self Care, Joint mobilization, Stair training, Vestibular training, Manual therapy, and Re-evaluation  PLAN FOR NEXT SESSION: Assess cervical spine/migraines-cervical deep neck flexor test-STG?, NDI-set goal, initiate HEP-deficits from FGA and cervical mobility/strength/MYF techniques   Bary Richard, PT, DPT 02/01/2022, 4:49 PM

## 2022-02-02 ENCOUNTER — Other Ambulatory Visit: Payer: Self-pay | Admitting: Nurse Practitioner

## 2022-02-02 DIAGNOSIS — I639 Cerebral infarction, unspecified: Secondary | ICD-10-CM | POA: Insufficient documentation

## 2022-02-02 DIAGNOSIS — M6281 Muscle weakness (generalized): Secondary | ICD-10-CM | POA: Insufficient documentation

## 2022-02-02 NOTE — Telephone Encounter (Signed)
Orders Placed This Encounter  ?Procedures  ? Ambulatory referral to Occupational Therapy  ? ?   ?

## 2022-02-02 NOTE — Addendum Note (Signed)
Addended by: Marcial Pacas on: 02/02/2022 01:20 PM   Modules accepted: Orders

## 2022-02-04 NOTE — Progress Notes (Signed)
FOLLOW UP 6 MONTH   Assessment and Plan:   Chronic migraine Managing well at this time, managed with Neurology and receiving Botox injections Q41month. Verapamil 2499mdaily   Spastic hemiplegia of left nondominant side as late effect of cerebrovascular disease, unspecified cerebrovascular disease type (HCC)/Questionable petit mal  Continue Botox injections, follow with Neurology, Dr YaKrista Blueor this.  Labile Hypertension - continue medications, DASH diet, exercise and monitor at home. Call if greater than 130/80.    Anxiety Doing well at this time Continue Alprazolam with benefit, but will cut down to 0.5 mg for her to try 1/2 as needed Will also cut back on wellbutrin from 30055mo 150m64mr possible serotonin syndrome.  Stress management techniques discussed, increase water, good sleep hygiene discussed, increase exercise, and increase veggies.   Mixed hyperlipidemia Continue medications: rosuvastatin, fenofibrate- currently doing Rosuvastatin every other day, may need to go back to daily pending results Continue low cholesterol diet and exercise.  Check lipid panel.  -     Lipid panel - CMP  Hypothyroidism, unspecified type Taking levothyroxine 88 mcg daily Reminder to take on an empty stomach 30-60mi54mefore first meal of the day. No antacid medications for 4 hours. -     TSH  ADD (attention deficit disorder) without hyperactivity Doing well on current regiment Continue with benefit -Adderall 10mg 77mto 1 tablet BID  Vitamin D deficiency monitor  Other abnormal glucose Recent A1Cs at goal Discussed diet/exercise, weight management  Defer A1C; check CMP  Medication management -     CBC with Differential/Platelet -     COMPLETE METABOLIC PANEL WITH GFR  Overweight,BMI 25.0-25.9 Discussed dietary and exercise modifications She is following a high protein, low fat, low carbs diet high in fruits and vegetables. Discuss exercise recommendations routinely Continue  to monitor weight at each visit  Mild intermittent asthma Has in date albuterol inhaler Doing well Using Breo daily and singulair daily Reminder to rinse mouth after use.  Flu Vaccine need - Flu QUAD 6 MOS + PF IM given  Dysuria Cipro 500 mg bid X 10 If urine culture is negative would discuss possible interstitial cystitis with GYN Push fluids If develops fever, abdominal pain, blood in urine, nausea/vomiting she is to go to the ER   Continue diet and meds as discussed. Further disposition pending results of labs. Discussed med's effects and SE's.   Over 30 minutes of face to face interview, exam, counseling, chart review, and critical decision making was performed.   Future Appointments  Date Time Provider DepartKettle River3/2023  1:15 PM VarnadBary RichardPRC-NR [facility]2023  1:15 PM Xu, NaLeandrew KoyanagiC-[place] None  02/15/2022  2:00 PM VarnadBary RichardPRC-NR [facility]NR[facility]/2023  1:15 PM VarnadBary RichardPRC-NR OPRCNRSan [facility]8/2023  3:20 PM GI-BCG MM 2 GI-BCGMM GI-BREAST CE  03/01/2022  1:15 PM VarnadBary RichardPRC-[facility]  1:15 PM VarnadBary RichardPRC-NR [facility]NR[facility]8/2023  1:15 PM VarnadBary RichardPRC-[facility]7/2023  1:15 PM VarnadBary RichardPRC-NR [facility]NR[facility]2024  1:15 PM VarnadBary RichardPRC-NR [facility]CNRS[facility]2024  1:15 PM VarnadBary RichardPRC-NR [facility]NR[facility]/2024  3:30 PM Yan, YMarcial PacasNA-GNA None  04/12/2022 10:15 AM VarnadBary RichardPRC-NR [facility]CNRS[facility]/2024 10:30 AM McKeowUnk PintoAAM-GAAIM None  11/08/2022 11:00 AM McKeowUnk PintoAAM-GAAIM None    ----------------------------------------------------------------------------------------------------------------------  HPI 61 y.o. female  presents for 3 month follow up on elevated blood pressures w/o diagnosis of HTN, HLD, Migraine, Hypothyroidism,  Pre-Diabetes and Vitamin D Deficiency.    She has been experiencing burning on urination, frequency of urination and lower abdominal pressure x 5 days.  Denies hematuria and back pain.   In 2009, she had a Right MCA CVA attributed to a Right Carotid artery dissection from fibromuscular dysplasia and has residual of a mild spastic Left HP and mild cognitive dysfunction difficulty focusing/concentrating. Adderall 10 mg does help symptoms  Patient also follow with Dr Krista Blue, Neurologist, for Bo-Tox injections for her spasticity and migraines.  Weather and situational stress trigger for migraines. Doing well with [name], fioricet PRN EEG in 01/2022 some mild activity had a few petit mal seizures- started on Lamictal 158m BID has not noticed any further episodes since starting the medication.   she has a diagnosis of anxiety and is currently on wellbutrin 150 mg and PRN xanax. . she reports currently takes xanax rarely, takes 1/2 tab just a few days per month , last filled Alprazolam 0.566m#30 on 01/11/22.  She has been having some neck pain and took a course of Prednisone which did help the pain somewhat. . Cervical 5,6,7 shows degenerative changes- is undergoing PT.  BMI is Body mass index is 23.48 kg/m., she has been working on diet and exercise. Doing new nutrition regimen.She is working on exercise.[facility]. Wt Readings from Last 3 Encounters:  02/08/22 159 lb (72.1 kg)  12/30/21 159 lb 3.2 oz (72.2 kg)  11/03/21 162 lb 6.4 oz (73.7 kg)   Her blood pressure has been controlled at home, today their BP is BP: 108/80 BP Readings from Last 3 Encounters:  02/08/22 108/80  02/01/22 (!) 133/91  01/13/22 128/83  She does not workout. She denies any cardiac symptoms, chest pains, palpitations, shortness of breath, dizziness or lower extremity edema.     She is on cholesterol medication Rosuvastatin 40 mg every other day( may need to return to daily), fenofibrate 134 mg daily and denies myalgias. She does limit  dairy, eggs and red meat. Her cholesterol is at goal. The cholesterol last visit was:   Lab Results  Component Value Date   CHOL 141 11/03/2021   HDL 54 11/03/2021   LDLCALC 64 11/03/2021   TRIG 143 11/03/2021   CHOLHDL 2.6 11/03/2021    She has not been working on diet and exercise for prediabetes (5.8%, 2008), and denies hyperglycemia, hypoglycemia , nausea, polydipsia and polyuria. Last A1C in the office was:  Lab Results  Component Value Date   HGBA1C 5.5 11/03/2021   Lab Results  Component Value Date   EGFR 62 11/03/2021     Patient has been on Thyroid Supression since Thyroid surgery for benign nodule and goiter in 2007.  She is currently taking levothyroxine 88 mcg daily.   Her medication was not changed last visit.   Lab Results  Component Value Date   TSH 2.04 11/03/2021   Patient is on Vitamin D supplement, has cut back from 50,000 once a week. Lab Results  Component Value Date   VD25OH 95 11/03/2021        Current Medications:   Current Outpatient Medications (Endocrine & Metabolic):    levothyroxine (SYNTHROID) 88 MCG tablet, TAKE 1 TABLET DAILY ON AN EMPTY STOMACH WITH ONLY WATER FOR 30 MINS& NO ANTACID MEDS, CALCIUM, OR MAGNESIUM FOR 4 HOURS. AVOID BIOTIN  Current Outpatient Medications (Cardiovascular):    fenofibrate micronized (LOFIBRA) 134 MG capsule, TAKE 1 CAPSULE DAILY FOR TRIGLYCERIDES (BLOOD FATS)   rosuvastatin (CRESTOR) 40 MG tablet, TAKE 1 TABLET BY MOUTH EVERY DAY FOR CHOLESTEROL   verapamil (CALAN-SR) 240 MG CR tablet, TAKE 1 TABLET DAILY WITH FOOD FOR BLOOD PRESSURE & MIGRAINE PREVENTION   Current Outpatient Medications (Respiratory):    albuterol (PROVENTIL HFA) 108 (90 Base) MCG/ACT inhaler, 1 to 2 inhalations 10-15 minutes apart every 4 hours if needed for asthma rescue   cetirizine (ZYRTEC) 10 MG tablet, Take 10 mg by mouth daily.   fluticasone furoate-vilanterol (BREO ELLIPTA) 100-25 MCG/ACT AEPB, Use  1 Inhalation  Daily    montelukast (SINGULAIR) 10 MG tablet, TAKE 1 TABLET BY MOUTH AT BEDTIME FOR ALLERGIES   Current Outpatient Medications (Analgesics):    butalbital-acetaminophen-caffeine (FIORICET) 50-325-40 MG tablet, TAKE 1 TABLET BY MOUTH DAILY AS NEEDED FOR HEADACHE. DO NOT REFILL IN LESS THAN 30 DAYS,   UBRELVY 100 MG TABS, TAKE 1 TABLET BY MOUTH AS NEEDED AS DIRECTED   meloxicam (MOBIC) 7.5 MG tablet, Take 1 tablet (7.5 mg total) by mouth 2 (two) times daily as needed for pain. (Patient not taking: Reported on 02/08/2022)   Current Outpatient Medications (Hematological):    vitamin B-12 (CYANOCOBALAMIN) 1000 MCG tablet, Take 1,000 mcg by mouth daily.   Current Outpatient Medications (Other):    ALPRAZolam (XANAX) 0.5 MG tablet, TAKE 1/2-1 TABLET AT BEDTIME IF NEEDED FOR SLEEP&LIMIT TO 5 DAYS/WEEK TO AVOID ADDICTION&DEMENTIA Strength: 0.5 mg   botulinum toxin Type A (BOTOX) 100 units SOLR injection, Inject 300 Units into the muscle every 3 (three) months.   buPROPion (WELLBUTRIN XL) 150 MG 24 hr tablet, TAKE 1 TABLET EVERY MORNING FOR MOOD. FOCUS & CONCENTRATION   Estradiol 10 MCG TABS vaginal tablet, Place 10 mcg vaginally 2 (two) times a week.   lamoTRIgine (LAMICTAL) 100 MG tablet, Take 1 tablet (100 mg total) by mouth 2 (two) times daily.   magnesium oxide (MAG-OX) 400 MG tablet, Take 400 mg by mouth 2 (two) times daily.    omeprazole (PRILOSEC) 40 MG capsule, TAKE 1 CAPSULE DAILY TO PREVENT HEARTBURN & INDIGESTION / PATIENT KNOWS TO TAKE BY MOUTH   ondansetron (ZOFRAN-ODT) 8 MG disintegrating tablet, DISSOLVE 1 TABLET UNDER TONGUE EVERY 6 TO 8 HOURS FOR NAUSEA OR VOMITTING   OVER THE COUNTER MEDICATION, Key E Suppositories   Riboflavin 100 MG TABS, Take 100 mg by mouth. bid   Vitamin D, Ergocalciferol, (DRISDOL) 1.25 MG (50000 UNIT) CAPS capsule, Take 1 capsule three days a week for Vitamin D deficiency.   zinc gluconate 50 MG tablet, Take 50 mg by mouth daily.   amphetamine-dextroamphetamine  (ADDERALL) 10 MG tablet, Take  1/2 - 1 tablet  1 or 2 x /day  for ADD  Current Facility-Administered Medications (Other):    botulinum toxin Type A (BOTOX) injection 300 Units   botulinum toxin Type A (BOTOX) injection 300 Units  Allergies:  Allergies  Allergen Reactions   Penicillins Anaphylaxis and Hives   Imitrex [Sumatriptan]    Kiwi Extract Swelling   Other Swelling    kiwi   Sulfa Antibiotics Hives   Zomig [Zolmitriptan]      Medical History:  Past Medical History:  Diagnosis Date   Anemia    Anxiety    Asthma    Chronic heartburn    Depression    Elevated cholesterol    on Crestor   Headache(784.0)    Hypertension  Hypothyroidism    S/P thyroidectomy   Migraine    Occlusion and stenosis of carotid artery with cerebral infarction    Spasm of muscle    Unspecified cerebral artery occlusion with cerebral infarction    Vitamin D deficiency    Allergies Allergies  Allergen Reactions   Penicillins Anaphylaxis and Hives   Imitrex [Sumatriptan]    Kiwi Extract Swelling   Other Swelling    kiwi   Sulfa Antibiotics Hives   Zomig [Zolmitriptan]     SURGICAL HISTORY She  has a past surgical history that includes Vaginal hysterectomy; Nasal sinus surgery; Cystocele repair (N/A, 01/08/2013); Cosmetic surgery; and Augmentation mammaplasty (Bilateral, 2000).   FAMILY HISTORY Her family history includes Heart disease in her father and mother; Parkinson's disease in her father; Peptic Ulcer Disease in her mother.   SOCIAL HISTORY She  reports that she has never smoked. She has never used smokeless tobacco. She reports current alcohol use. She reports that she does not use drugs.   Screening Tests: Immunization History  Administered Date(s) Administered   Influenza Inj Mdck Quad With Preservative 01/20/2017, 01/09/2018, 01/10/2019, 01/25/2020   Influenza Split 01/24/2014   Influenza, Seasonal, Injecte, Preservative Fre 02/09/2016   Influenza,inj,Quad PF,6+ Mos  03/10/2021   Influenza-Unspecified 11/28/2011   PFIZER(Purple Top)SARS-COV-2 Vaccination 06/11/2019, 07/04/2019   PPD Test 04/04/2013, 04/22/2015, 05/18/2016, 06/20/2017, 07/27/2018, 11/03/2020, 11/03/2021   Pneumococcal Polysaccharide-23 03/29/1998   Td 03/29/2002   Tdap 05/18/2016     Review of Systems:  Review of Systems  Constitutional:  Negative for chills, fever and weight loss.  HENT:  Negative for congestion and hearing loss.   Eyes:  Negative for blurred vision and double vision.  Respiratory:  Negative for cough and shortness of breath.   Cardiovascular:  Negative for chest pain, palpitations, orthopnea and leg swelling.  Gastrointestinal:  Negative for abdominal pain, constipation, diarrhea, heartburn, nausea and vomiting.  Genitourinary:  Positive for dysuria and frequency. Negative for flank pain and hematuria.  Musculoskeletal:  Positive for myalgias and neck pain. Negative for falls and joint pain.  Skin:  Negative for rash.  Neurological:  Positive for headaches. Negative for dizziness, tingling, tremors and loss of consciousness.  Psychiatric/Behavioral:  Negative for depression, memory loss and suicidal ideas.      Physical Exam: BP 108/80   Pulse 94   Temp 97.7 F (36.5 C)   Ht _0  (1.753 m)   Wt 159 lb (72.1 kg)   SpO2 98%   BMI 23.48 kg/m  Wt Readings from Last 3 Encounters:  02/08/22 159 lb (72.1 kg)  12/30/21 159 lb 3.2 oz (72.2 kg)  11/03/21 162 lb 6.4 oz (73.7 kg)    General Appearance: Well nourished, in no apparent distress. Eyes: PERRLA, EOMs, conjunctiva no swelling or erythema Sinuses: No Frontal/maxillary tenderness ENT/Mouth: Ext aud canals clear, TMs without erythema, bulging. No erythema, swelling, or exudate on post pharynx.  Tonsils not swollen or erythematous. Hearing normal.  Neck: Supple, thyroid normal.  Respiratory: Respiratory effort normal, BS equal bilaterally without rales, rhonchi, wheezing or stridor.  Cardio: RRR with  no MRGs. Brisk peripheral pulses without edema.  Abdomen: Soft, + BS.  Non tender, no guarding, rebound, hernias, masses. Lymphatics: Non tender without lymphadenopathy.  Musculoskeletal: Full ROM, 5/5 strength right, 4/5 strength on left. normal gait, slow. Negative CVA tenderness Skin: Warm, dry without rashes, lesions, ecchymosis.  Neuro: Cranial nerves intact. No cerebellar symptoms.  Psych: Awake and oriented X 3, normal affect, Insight and Judgment  appropriate.    Alycia Rossetti, NP 12:37 PM [facility]

## 2022-02-05 ENCOUNTER — Encounter: Payer: Self-pay | Admitting: Nurse Practitioner

## 2022-02-05 ENCOUNTER — Other Ambulatory Visit: Payer: Self-pay | Admitting: Nurse Practitioner

## 2022-02-05 DIAGNOSIS — J3089 Other allergic rhinitis: Secondary | ICD-10-CM

## 2022-02-05 MED ORDER — MONTELUKAST SODIUM 10 MG PO TABS: ORAL_TABLET | ORAL | 1 refills | Status: DC

## 2022-02-08 ENCOUNTER — Telehealth: Payer: Self-pay | Admitting: Neurology

## 2022-02-08 ENCOUNTER — Ambulatory Visit: Payer: Medicare Other | Admitting: Physical Therapy

## 2022-02-08 ENCOUNTER — Encounter: Payer: Self-pay | Admitting: Nurse Practitioner

## 2022-02-08 ENCOUNTER — Ambulatory Visit (INDEPENDENT_AMBULATORY_CARE_PROVIDER_SITE_OTHER): Payer: Medicare Other | Admitting: Nurse Practitioner

## 2022-02-08 ENCOUNTER — Encounter: Payer: Self-pay | Admitting: Physical Therapy

## 2022-02-08 VITALS — BP 108/80 | HR 94 | Temp 97.7°F | Ht 69.0 in | Wt 159.0 lb

## 2022-02-08 DIAGNOSIS — Z23 Encounter for immunization: Secondary | ICD-10-CM | POA: Diagnosis not present

## 2022-02-08 DIAGNOSIS — J4520 Mild intermittent asthma, uncomplicated: Secondary | ICD-10-CM | POA: Diagnosis not present

## 2022-02-08 DIAGNOSIS — R2689 Other abnormalities of gait and mobility: Secondary | ICD-10-CM

## 2022-02-08 DIAGNOSIS — R296 Repeated falls: Secondary | ICD-10-CM

## 2022-02-08 DIAGNOSIS — I69954 Hemiplegia and hemiparesis following unspecified cerebrovascular disease affecting left non-dominant side: Secondary | ICD-10-CM

## 2022-02-08 DIAGNOSIS — E559 Vitamin D deficiency, unspecified: Secondary | ICD-10-CM | POA: Diagnosis not present

## 2022-02-08 DIAGNOSIS — M6281 Muscle weakness (generalized): Secondary | ICD-10-CM

## 2022-02-08 DIAGNOSIS — R0989 Other specified symptoms and signs involving the circulatory and respiratory systems: Secondary | ICD-10-CM | POA: Diagnosis not present

## 2022-02-08 DIAGNOSIS — E782 Mixed hyperlipidemia: Secondary | ICD-10-CM | POA: Diagnosis not present

## 2022-02-08 DIAGNOSIS — E663 Overweight: Secondary | ICD-10-CM

## 2022-02-08 DIAGNOSIS — E039 Hypothyroidism, unspecified: Secondary | ICD-10-CM

## 2022-02-08 DIAGNOSIS — Z0001 Encounter for general adult medical examination with abnormal findings: Secondary | ICD-10-CM | POA: Diagnosis not present

## 2022-02-08 DIAGNOSIS — G43709 Chronic migraine without aura, not intractable, without status migrainosus: Secondary | ICD-10-CM

## 2022-02-08 DIAGNOSIS — R2681 Unsteadiness on feet: Secondary | ICD-10-CM | POA: Diagnosis not present

## 2022-02-08 DIAGNOSIS — I639 Cerebral infarction, unspecified: Secondary | ICD-10-CM

## 2022-02-08 DIAGNOSIS — R6889 Other general symptoms and signs: Secondary | ICD-10-CM

## 2022-02-08 DIAGNOSIS — R7309 Other abnormal glucose: Secondary | ICD-10-CM | POA: Diagnosis not present

## 2022-02-08 DIAGNOSIS — Z79899 Other long term (current) drug therapy: Secondary | ICD-10-CM

## 2022-02-08 DIAGNOSIS — M542 Cervicalgia: Secondary | ICD-10-CM | POA: Diagnosis not present

## 2022-02-08 DIAGNOSIS — F988 Other specified behavioral and emotional disorders with onset usually occurring in childhood and adolescence: Secondary | ICD-10-CM

## 2022-02-08 DIAGNOSIS — R300 Dysuria: Secondary | ICD-10-CM

## 2022-02-08 MED ORDER — CIPROFLOXACIN HCL 500 MG PO TABS: 500.0000 mg | ORAL_TABLET | Freq: Two times a day (BID) | ORAL | 0 refills | Status: AC

## 2022-02-08 MED ORDER — NITROFURANTOIN MONOHYD MACRO 100 MG PO CAPS: 100.0000 mg | ORAL_CAPSULE | Freq: Two times a day (BID) | ORAL | 0 refills | Status: DC

## 2022-02-08 NOTE — Therapy (Signed)
OUTPATIENT PHYSICAL THERAPY NEURO TREATMENT   Patient Name: Ruth Gregory MRN: [id number] DOB:28-Sep-1960, 61 y.o., female Today's Date: 02/08/2022   PCP: Lucky Cowboy, MD REFERRING PROVIDER: Levert Feinstein, MD   PT End of Session - 02/08/22 1321     Visit Number 2    Number of Visits 11   10+eval   Date for PT Re-Evaluation 04/16/22    Authorization Type [employer] MEDICARE    Progress Note Due on Visit 10    PT Start Time 1322   Pt requesting restroom at onset of session due to possible UTI concerns.  7 mins not billed for (115-122pm).   PT Stop Time 1405    PT Time Calculation (min) 43 min    Activity Tolerance Patient tolerated treatment well    Behavior During Therapy WFL for tasks assessed/performed             Past Medical History:  Diagnosis Date   Anemia    Anxiety    Asthma    Chronic heartburn    Depression    Elevated cholesterol    on Crestor   Headache(784.0)    Hypertension    Hypothyroidism    S/P thyroidectomy   Migraine    Occlusion and stenosis of carotid artery with cerebral infarction    Spasm of muscle    Unspecified cerebral artery occlusion with cerebral infarction    Vitamin D deficiency    Past Surgical History:  Procedure Laterality Date   AUGMENTATION MAMMAPLASTY Bilateral 2000   COSMETIC SURGERY     CYSTOCELE REPAIR N/A 01/08/2013   Procedure: ANTERIOR REPAIR (CYSTOCELE);  Surgeon: Meriel Pica, MD;  Location: [facility];  Service: Gynecology;  Laterality: N/A;   NASAL SINUS SURGERY     VAGINAL HYSTERECTOMY     Patient Active Problem List   Diagnosis Date Noted   Cerebrovascular accident (CVA) (HCC) 02/02/2022   Muscle weakness (generalized) 02/02/2022   Gait abnormality 01/13/2022   Closed displaced fracture of proximal phalanx of lesser toe, initial encounter 10/21/2021   Squamous cell carcinoma in situ (SCCIS) of dorsum of left hand 09/11/2021   Arthritis of carpometacarpal [facility]) joint of right thumb 08/11/2021    Degenerative arthritis of distal interphalangeal joint of index finger of right hand 08/11/2021   Degenerative arthritis of distal interphalangeal joint of middle finger of right hand 08/11/2021   Degenerative arthritis of distal interphalangeal joint of ring finger of right hand 08/11/2021   Partial symptomatic epilepsy with complex partial seizures, not intractable, without status epilepticus (HCC) 01/21/2021   B12 deficiency 09/18/2020   Chronic migraine w/o aura w/o status migrainosus, not intractable 04/15/2020   Spastic hemiplegia of left nondominant side as late effect of cerebrovascular disease (HCC) 06/18/2019   Abnormal glucose 07/27/2018   Labile hypertension 07/27/2018   Fatty liver 10/09/2017   GERD (gastroesophageal reflux disease) 10/07/2017   Anxiety 10/07/2017   Hypothyroidism 07/23/2015   Vitamin D deficiency 10/03/2013   Medication management 10/03/2013   History of prediabetes 10/03/2013   ADD (attention deficit disorder) without hyperactivity 10/03/2013   Hyperlipidemia, mixed 04/04/2013   Sinusitis, chronic 04/04/2013   Migraine 09/06/2012   Spastic hemiplegia affecting left nondominant side (HCC) 09/06/2012   History of CVA (cerebrovascular accident)     ONSET DATE: 01/13/2022 (referral)  REFERRING DIAG: I29.798 (ICD-10-CM) - Spastic hemiplegia of left nondominant side as late effect of cerebrovascular disease, unspecified cerebrovascular disease type (HCC) G43.709 (ICD-10-CM) - Chronic migraine w/o aura w/o status migrainosus, not intractable  G40.209 (ICD-10-CM) - Partial symptomatic epilepsy with complex partial seizures, not intractable, without status epilepticus (HCC) M54.2 (ICD-10-CM) - Neck pain  THERAPY DIAG:  Muscle weakness (generalized)  Cerebrovascular accident (CVA), unspecified mechanism (HCC)  Other abnormalities of gait and mobility  Unsteadiness on feet  Repeated falls  Cervicalgia  Rationale for Evaluation and Treatment:  Rehabilitation  SUBJECTIVE:                                                                                                                                                                                             SUBJECTIVE STATEMENT: Pt states she may have a UTI and was recently at the doctor about this earlier today.  She states her neck is not throbbing today. 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  Compliant to med schedule to manage this and has returned to driving.  Botox to left hemibody 10/18.   PAIN:  Are you having pain? No  PRECAUTIONS: Cervical and Fall-pt has stent in right internal carotid  WEIGHT BEARING RESTRICTIONS: No  FALLS: Has patient fallen in last 6 months? Yes. Number of falls 2-tripped over laundry basket and does not recall the second  LIVING ENVIRONMENT: Lives with: lives with their spouse Lives in: House/apartment Stairs: Yes: Internal: 16 steps; on right going up and External: 3 steps; none Has following equipment at home: Grab bars and raised commode seat and removable show head  PLOF: Independent  PATIENT GOALS: "To feel more comfortable about not falling."  OBJECTIVE:   TODAY'S TREATMENT:                                                                                                                              DATE: N/A  Cervical Assessment: AROM: -Extension 35 deg -Flexion 35 deg -Left lateral flexion 40 deg -Right lateral flexion 38 deg -  Left rotation 46 deg -Right rotation 38 deg  -Cervical DNF endurance test - 4.94 sec prior to losing chin tuck -Pt fills out NDI-scores 26/50 = 52%  Initiated HEP: -Pt trials fwd/bckwrd tandem at  counter w/ most trouble w/ backwards tandem (added to HEP) -Backwards walking w/o UE support 4x10' -Walking at countertop progressing to no UE support w/ head turns 6x10' -Feet together on pillows w/ EC in corner using single fingertip support 3x30 sec w/ 1 instance of significant posterior sway using hip strategy to recover -Supine chin tucks w/ 2 second hold x12 -Supine cervical retraction using rolled washcloth to support cervical lordosis x15 w/ 1 sec hold  PATIENT EDUCATION: Education details:  Person educated: Patient Education method: Explanation Education comprehension: verbalized understanding  HOME EXERCISE PROGRAM: Access Code: [email or web address] URL: [email or web address] Date: 02/08/2022 Prepared by: Camille Bal  Exercises - Backward Tandem Walking with Counter Support  - 1 x daily - 5 x weekly - 3 sets - 10 reps - Romberg Stance Eyes Closed on Foam Pad  - 1 x daily - 5 x weekly - 1 sets - 3 reps - 30 seconds hold - Supine Chin Tuck  - 1 x daily - 5 x weekly - 2 sets - 12 reps - Supine Cervical Retraction with Towel  - 1 x daily - 5 x weekly - 2 sets - 10 reps  GOALS: Goals reviewed with patient? Yes  SHORT TERM GOALS: Target date: 03/05/2022  Pt will be independent with initial strength and balance HEP to maintain gains outside the clinic. Baseline:  To be established. Goal status: INITIAL  2.  Pt will improve NDI score to no more than 22/50 to indicate subjective improvement in functioning. Baseline: 26/50 Goal status: INITIAL  3.  Pt will be able to verbalize fall prevention strategies and will demonstrate independence with floor recovery. Baseline: Pt reports independence, PT yet to witness. Goal status: INITIAL  3.  Pt will maintain chin tuck >/=13 sec during deep neck flexor endurance test to demonstrate improved muscular endurance for supporting cervical musculature. Baseline: 4.94 sec 11/13 Goal status: INITIAL  LONG TERM GOALS: Target date:  04/02/2021 (8 weeks); update for 12 weeks at assessment (04/16/2022)  Pt will be independent w/ finalized strength and balance and cervical mobility HEP to further improve mobility and pain. Baseline:  To be updated. Goal status: INITIAL  2.  Pt will improve FGA score to >/=25/30 in order to demonstrate improved balance and decreased fall risk. Baseline: 20/30 Goal status: INITIAL  3.  Pt will reports </=5 days of migraines per month to demonstrate subjective improvement in function. Baseline: reports 8-10 migraines per month Goal status: INITIAL  4.  Pt will improve NDI to score to no more than 18/50 to demonstrate subjective improvement in functioning. Baseline: 26/50 Goal status: INITIAL  5.  Pt will ambulate up and down 16 stairs modI level w/ reciprocal or step-to gait without LOB to improve safe mobility in home environment. Baseline: Pt can go reciprocally w/ right rail, but has lost balance at home several times resulting in a previous fall. Goal status: INITIAL  ASSESSMENT:  CLINICAL IMPRESSION: Focus of skilled session today on continuing assessment of the cervical spine and ongoing intermittent pain and migraines.  Pt demonstrates fair AROM of the neck without pain, decreased endurance of deep cervical musculature, and moderate perceived disability related to cervical issues.  Proceeded to introduce HEP focused on both balance and cervical strength.  Will continue with POC focused on  deficits highlighted on evaluation.  OBJECTIVE IMPAIRMENTS: Abnormal gait, decreased balance, decreased knowledge of use of DME, decreased strength, impaired sensation, impaired tone, impaired UE functional use, improper body mechanics, and postural dysfunction.   ACTIVITY LIMITATIONS: carrying, lifting, stairs, and locomotion level  PARTICIPATION LIMITATIONS: community activity  PERSONAL FACTORS: Age, Fitness, Past/current experiences, Time since onset of injury/illness/exacerbation, and 1-2  comorbidities: anxiety and Fuch's disease  are also affecting patient's functional outcome.   REHAB POTENTIAL: Excellent  CLINICAL DECISION MAKING: Evolving/moderate complexity  EVALUATION COMPLEXITY: Moderate  PLAN:  PT FREQUENCY: 1x/week  PT DURATION: 10 weeks  PLANNED INTERVENTIONS: Therapeutic exercises, Therapeutic activity, Neuromuscular re-education, Balance training, Gait training, Patient/Family education, Self Care, Joint mobilization, Stair training, Vestibular training, Manual therapy, and Re-evaluation  PLAN FOR NEXT SESSION:  Modify HEP prn-deficits from FGA and cervical mobility/strength/MYF techniques, chair yoga, upper trap/levator/SCM stretches   Sadie Haber, PT, DPT 02/08/2022, 2:17 PM

## 2022-02-08 NOTE — Patient Instructions (Signed)
Access Code: [email or web address] URL: [email or web address] Date: 02/08/2022 Prepared by: Camille Bal  Exercises - Backward Tandem Walking with Counter Support  - 1 x daily - 5 x weekly - 3 sets - 10 reps - Romberg Stance Eyes Closed on Foam Pad  - 1 x daily - 5 x weekly - 1 sets - 3 reps - 30 seconds hold - Supine Chin Tuck  - 1 x daily - 5 x weekly - 2 sets - 12 reps - Supine Cervical Retraction with Towel  - 1 x daily - 5 x weekly - 2 sets - 10 reps

## 2022-02-08 NOTE — Telephone Encounter (Signed)
Patient is requesting more information for injectable medication prescribed for migraines. States it is at pharmacy however would like further information before starting.

## 2022-02-08 NOTE — Patient Instructions (Addendum)
Interstitial Cystitis- possibility  Interstitial cystitis is inflammation of the bladder. This condition is also known as painful bladder syndrome. This may cause pain in the bladder area as well as a frequent and urgent need to urinate. The bladder is an organ that stores urine after the urine is made in the kidneys. The severity of interstitial cystitis can vary from person to person. You may have flare-ups, and then your symptoms may go away for a while. For many people, it becomes a long-term (chronic) problem. What are the causes? The cause of this condition is not known. What increases the risk? The following factors may make you more likely to develop this condition: Being female. Having fibromyalgia. Having irritable bowel syndrome (IBS). Having endometriosis. Having chronic fatigue syndrome. This condition may be aggravated by: Stress. Smoking. Spicy foods. What are the signs or symptoms? Symptoms of interstitial cystitis vary, and they can change over time. Symptoms may include: Discomfort or pain in the bladder area, which is in the lower abdomen. Pain can range from mild to severe. The pain may change in intensity as the bladder fills with urine or as it empties. Pain in the pelvic area, between the hip bones. A constant urge to urinate. Frequent urination. Pain during urination. Pain during sex. Blood in the urine. Feeling tired (fatigue). For women, symptoms often get worse during menstruation. How is this diagnosed? This condition is diagnosed based on your symptoms, your medical history, and a physical exam. Your health care provider may need to rule out other conditions and may order other tests, such as: Urine tests. Cystoscopy. For this test, a tool similar to a very thin telescope is used to look into your bladder. Biopsy. This involves taking a sample of tissue from the bladder to be examined under a microscope. How is this treated? There is no cure for  this condition, but treatment can help you control your symptoms. Work closely with your health care provider to find the most effective treatments for you. Treatment options may include: Medicines to relieve pain and reduce how often you feel the need to urinate. This treatment may include: A procedure where a small amount of medicine that eases irritation is put inside your bladder through a catheter (bladder instillation). Lifestyle changes, such as changing your diet or taking steps to control stress. Physical therapy. This may include: Exercises to help relax the pelvic floor muscles. Massage to relax tight muscles (myofascial release). Learning ways to control when you urinate (bladder training). Using a device that provides electrical stimulation to your nerves, which can relieve pain (neuromodulation therapy). The device is placed on your back, where it blocks the nerves that cause you to feel pain in your bladder area. A procedure that stretches your bladder by filling it with air or fluid (hydrodistention). Surgery. This is rare. It is only done for extreme cases, if other treatments do not help. Follow these instructions at home: Lifestyle Learn and practice relaxation techniques, such as deep breathing and muscle relaxation. Get care for your body and mental well-being, such as: Cognitive behavioral therapy (CBT). This therapy changes the way you think or act in response to different situations. This may improve how you feel. Seeing a mental health therapist to evaluate and treat depression, if necessary. Work with your health care provider on other ways to manage pain. Acupuncture may be helpful. Avoid drinking alcohol. Do not use any products that contain nicotine or tobacco. These products include cigarettes, chewing tobacco, and  vaping devices, such as e-cigarettes. If you need help quitting, ask your health care provider. Eating and drinking Make dietary changes as recommended  by your health care provider. You may need to avoid: Spicy foods. Foods that contain a lot of potassium. Limit your intake of drinks that increase your urge to urinate. These include alcohol and caffeinated drinks like soda, coffee, and tea. Bladder training  Use bladder training techniques as directed. Techniques may include: Urinating at scheduled times. Training yourself to delay urination. Keep a bladder diary. Write down the times you urinate and any symptoms that you have. This can help you find out which foods, liquids, or activities make your symptoms worse. Use your bladder diary to schedule bathroom trips. If you are away from home, plan to be near a bathroom at each of your scheduled times. Make sure that you urinate just before you leave the house and just before you go to bed. General instructions Take over-the-counter and prescription medicines only as told by your health care provider. Try a warm or cool compress over your bladder for comfort. Avoid wearing tight clothing. Do exercises to relax your pelvic floor muscles as told by your physical therapist. Keep all follow-up visits. This is important. Where to find more information To find more information or a support group near you, visit: [employer]: urologyhealth.org Interstitial Cystitis Association: [employer] Contact a health care provider if you have: Symptoms that do not get better with treatment. Pain or discomfort that gets worse. More frequent urges to urinate. A fever. Get help right away if: You have no control over when you urinate. Summary Interstitial cystitis is inflammation of the bladder. This condition may cause pain in the bladder area as well as a frequent and urgent need to urinate. You may have flare-ups of the condition, and then it may go away for a while. For many people, it becomes a long-term (chronic) problem. There is no cure for interstitial cystitis, but treatment methods  are available to control your symptoms. This information is not intended to replace advice given to you by your health care provider. Make sure you discuss any questions you have with your health care provider. Document Revised: 10/19/2019 Document Reviewed: 10/19/2019 Elsevier Patient Education  2023 Else[employer] Inc. GENERAL HEALTH GOALS   Know what a healthy weight is for you (roughly BMI <25) and aim to maintain this   Aim for 7+ servings of fruits and vegetables daily   70-80+ fluid ounces of water or unsweet tea for healthy kidneys   Limit to max 1 drink of alcohol per day; avoid smoking/tobacco   Limit animal fats in diet for cholesterol and heart health - choose grass fed whenever available   Avoid highly processed foods, and foods high in saturated/trans fats   Aim for low stress - take time to unwind and care for your mental health   Aim for 150 min of moderate intensity exercise weekly for heart health, and weights twice weekly for bone health   Aim for 7-9 hours of sleep daily

## 2022-02-09 ENCOUNTER — Ambulatory Visit (INDEPENDENT_AMBULATORY_CARE_PROVIDER_SITE_OTHER): Payer: Medicare Other

## 2022-02-09 ENCOUNTER — Ambulatory Visit: Payer: Medicare Other | Admitting: Orthopaedic Surgery

## 2022-02-09 DIAGNOSIS — M79672 Pain in left foot: Secondary | ICD-10-CM

## 2022-02-09 NOTE — Telephone Encounter (Signed)
Mychart message sent to the pt addressing her questions/concerns.

## 2022-02-09 NOTE — Progress Notes (Signed)
X-rays demonstrate  Office Visit Note   Patient: Ruth Gregory           Date of Birth: 1961/02/27           MRN: [id number] Visit Date: 02/09/2022              Requested by: Lucky Cowboy, MD [address] [place],  [place] [address] PCP: Lucky Cowboy, MD   Assessment & Plan: Visit Diagnoses:  1. Pain in left foot     Plan: Ashante follows up today for her left second toe fracture.  She is doing well at this point.  Denies any pain.  Reports some swelling.  Examination of the left second toe shows no tenderness to palpation.  There is some mild residual swelling.  Alignment is normal.  X-rays demonstrate full fracture consolidation.  At this point she is radiographically healed.  Discussed natural course of toe fractures.  Activity as tolerated.  Follow-up as needed.  Follow-Up Instructions: No follow-ups on file.   Orders:  Orders Placed This Encounter  Procedures   XR Foot Complete Left   No orders of the defined types were placed in this encounter.     Procedures: No procedures performed   Clinical Data: No additional findings.   Subjective: Chief Complaint  Patient presents with   Left Foot - Pain    HPI  Review of Systems   Objective: Vital Signs: There were no vitals taken for this visit.  Physical Exam  Ortho Exam  Specialty Comments:  No specialty comments available.  Imaging: XR Foot Complete Left  Result Date: 02/09/2022 Complete fracture healing and abundant callus formation of the proximal phalanx of the second toe.    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 09/11/2021   Arthritis of carpometacarpal [facility])  joint of right thumb 08/11/2021   Degenerative arthritis of distal interphalangeal joint of index finger of right hand 08/11/2021   Degenerative arthritis of distal interphalangeal joint of middle finger of right hand 08/11/2021   Degenerative arthritis of distal interphalangeal joint of ring finger of right hand 08/11/2021   Partial symptomatic epilepsy with complex partial seizures, not intractable, without status epilepticus (HCC) 01/21/2021   B12 deficiency 09/18/2020   Chronic migraine w/o aura w/o status migrainosus, not intractable 04/15/2020   Spastic hemiplegia of left nondominant side as late effect of cerebrovascular disease (HCC) 06/18/2019   Abnormal glucose 07/27/2018   Labile hypertension 07/27/2018   Fatty liver 10/09/2017   GERD (gastroesophageal reflux disease) 10/07/2017   Anxiety 10/07/2017   Hypothyroidism 07/23/2015   Vitamin D deficiency 10/03/2013   Medication management 10/03/2013   History of prediabetes 10/03/2013   ADD (attention deficit disorder) without hyperactivity 10/03/2013   Hyperlipidemia, mixed 04/04/2013   Sinusitis, chronic 04/04/2013   Migraine 09/06/2012   Spastic hemiplegia affecting left nondominant side (HCC) 09/06/2012   History of CVA (cerebrovascular accident)    Past Medical History:  Diagnosis Date   Anemia    Anxiety    Asthma    Chronic heartburn    Depression    Elevated cholesterol    on Crestor   Headache(784.0)    Hypertension    Hypothyroidism  S/P thyroidectomy   Migraine    Occlusion and stenosis of carotid artery with cerebral infarction    Spasm of muscle    Unspecified cerebral artery occlusion with cerebral infarction    Vitamin D deficiency     Family History  Problem Relation Age of Onset   Heart disease Mother    Peptic Ulcer Disease Mother    Heart disease Father    Parkinson's disease Father     Past Surgical History:  Procedure Laterality Date   AUGMENTATION MAMMAPLASTY Bilateral 2000   COSMETIC  SURGERY     CYSTOCELE REPAIR N/A 01/08/2013   Procedure: ANTERIOR REPAIR (CYSTOCELE);  Surgeon: Meriel Pica, MD;  Location: [facility];  Service: Gynecology;  Laterality: N/A;   NASAL SINUS SURGERY     VAGINAL HYSTERECTOMY     Social History   Occupational History   Occupation: DESIGNER    Employer: [employer]  Tobacco Use   Smoking status: Never   Smokeless tobacco: Never  Substance and Sexual Activity   Alcohol use: Yes    Comment: occassionally drinks a glass of wine   Drug use: No   Sexual activity: Not on file

## 2022-02-11 LAB — URINALYSIS, ROUTINE W REFLEX MICROSCOPIC
Bilirubin Urine: NEGATIVE
Glucose, UA: NEGATIVE
Hyaline Cast: NONE SEEN /LPF
Ketones, ur: NEGATIVE
Nitrite: POSITIVE — AB
Protein, ur: NEGATIVE
RBC / HPF: NONE SEEN /HPF (ref 0–2)
Specific Gravity, Urine: 1.012 (ref 1.001–1.035)
WBC, UA: >=60 /HPF — AB (ref 0–5)
pH: 8 (ref 5.0–8.0)

## 2022-02-11 LAB — LIPID PANEL
Cholesterol: 212 mg/dL — ABNORMAL HIGH (ref <200)
HDL: 58 mg/dL (ref >=50)
LDL Cholesterol (Calc): 120 mg/dL (calc) — ABNORMAL HIGH
Non-HDL Cholesterol (Calc): 154 mg/dL (calc) — ABNORMAL HIGH (ref <130)
Total CHOL/HDL Ratio: 3.7 (calc) (ref <5.0)
Triglycerides: 223 mg/dL — ABNORMAL HIGH (ref <150)

## 2022-02-11 LAB — COMPLETE METABOLIC PANEL WITH GFR
AG Ratio: 2.3 (calc) (ref 1.0–2.5)
ALT: 23 U/L (ref 6–29)
AST: 17 U/L (ref 10–35)
Albumin: 4.8 g/dL (ref 3.6–5.1)
Alkaline phosphatase (APISO): 72 U/L (ref 37–153)
BUN: 16 mg/dL (ref 7–25)
CO2: 32 mmol/L (ref 20–32)
Calcium: 10 mg/dL (ref 8.6–10.4)
Chloride: 102 mmol/L (ref 98–110)
Creat: 0.91 mg/dL (ref 0.50–1.05)
Globulin: 2.1 g/dL (calc) (ref 1.9–3.7)
Glucose, Bld: 92 mg/dL (ref 65–99)
Potassium: 4.1 mmol/L (ref 3.5–5.3)
Sodium: 140 mmol/L (ref 135–146)
Total Bilirubin: 0.6 mg/dL (ref 0.2–1.2)
Total Protein: 6.9 g/dL (ref 6.1–8.1)
eGFR: 71 mL/min/{1.73_m2} (ref >=60)

## 2022-02-11 LAB — TSH: TSH: 0.36 mIU/L — ABNORMAL LOW (ref 0.40–4.50)

## 2022-02-11 LAB — CBC WITH DIFFERENTIAL/PLATELET
Absolute Monocytes: 359 cells/uL (ref 200–950)
Basophils Absolute: 32 cells/uL (ref 0–200)
Basophils Relative: 0.5 %
Eosinophils Absolute: 120 cells/uL (ref 15–500)
Eosinophils Relative: 1.9 %
HCT: 43.8 % (ref 35.0–45.0)
Hemoglobin: 15.3 g/dL (ref 11.7–15.5)
Lymphs Abs: 1380 cells/uL (ref 850–3900)
MCH: 31.6 pg (ref 27.0–33.0)
MCHC: 34.9 g/dL (ref 32.0–36.0)
MCV: 90.5 fL (ref 80.0–100.0)
MPV: 10.6 fL (ref 7.5–12.5)
Monocytes Relative: 5.7 %
Neutro Abs: 4410 cells/uL (ref 1500–7800)
Neutrophils Relative %: 70 %
Platelets: 195 10*3/uL (ref 140–400)
RBC: 4.84 10*6/uL (ref 3.80–5.10)
RDW: 12.3 % (ref 11.0–15.0)
Total Lymphocyte: 21.9 %
WBC: 6.3 10*3/uL (ref 3.8–10.8)

## 2022-02-11 LAB — MICROSCOPIC MESSAGE

## 2022-02-11 LAB — URINE CULTURE
MICRO NUMBER:: 14184478
SPECIMEN QUALITY:: ADEQUATE

## 2022-02-12 DIAGNOSIS — J208 Acute bronchitis due to other specified organisms: Secondary | ICD-10-CM | POA: Diagnosis not present

## 2022-02-12 DIAGNOSIS — R059 Cough, unspecified: Secondary | ICD-10-CM | POA: Diagnosis not present

## 2022-02-12 DIAGNOSIS — J45998 Other asthma: Secondary | ICD-10-CM | POA: Diagnosis not present

## 2022-02-12 DIAGNOSIS — J40 Bronchitis, not specified as acute or chronic: Secondary | ICD-10-CM | POA: Diagnosis not present

## 2022-02-14 NOTE — Therapy (Signed)
OUTPATIENT OCCUPATIONAL THERAPY NEURO EVALUATION  Patient Name: Ruth Gregory MRN: [id number] DOB:[birth year], 61 y.o., female Today's Date: 02/15/2022  PCP: Lucky Cowboy, MD REFERRING PROVIDER: Levert Feinstein, MD  END OF SESSION:  OT End of Session - 02/15/22 1513     Visit Number 1    Number of Visits 1    Authorization Type UHC MCR    OT Start Time 1317    OT Stop Time 1400    OT Time Calculation (min) 43 min    Activity Tolerance Patient tolerated treatment well    Behavior During Therapy WFL for tasks assessed/performed             Past Medical History:  Diagnosis Date   Anemia    Anxiety    Asthma    Chronic heartburn    Depression    Elevated cholesterol    on Crestor   Headache(784.0)    Hypertension    Hypothyroidism    S/P thyroidectomy   Migraine    Occlusion and stenosis of carotid artery with cerebral infarction    Spasm of muscle    Unspecified cerebral artery occlusion with cerebral infarction    Vitamin D deficiency    Past Surgical History:  Procedure Laterality Date   AUGMENTATION MAMMAPLASTY Bilateral 2000   COSMETIC SURGERY     CYSTOCELE REPAIR N/A 01/08/2013   Procedure: ANTERIOR REPAIR (CYSTOCELE);  Surgeon: Meriel Pica, MD;  Location: [facility];  Service: Gynecology;  Laterality: N/A;   NASAL SINUS SURGERY     VAGINAL HYSTERECTOMY     Patient Active Problem List   Diagnosis Date Noted   Cerebrovascular accident (CVA) (HCC) 02/02/2022   Muscle weakness (generalized) 02/02/2022   Gait abnormality 01/13/2022   Closed displaced fracture of proximal phalanx of lesser toe, initial encounter 10/21/2021   Squamous cell carcinoma in situ (SCCIS) of dorsum of left hand 09/11/2021   Arthritis of carpometacarpal [facility]) joint of right thumb 08/11/2021   Degenerative arthritis of distal interphalangeal joint of index finger of right hand 08/11/2021   Degenerative arthritis of distal interphalangeal joint of middle finger of right hand  08/11/2021   Degenerative arthritis of distal interphalangeal joint of ring finger of right hand 08/11/2021   Partial symptomatic epilepsy with complex partial seizures, not intractable, without status epilepticus (HCC) 01/21/2021   B12 deficiency 09/18/2020   Chronic migraine w/o aura w/o status migrainosus, not intractable 04/15/2020   Spastic hemiplegia of left nondominant side as late effect of cerebrovascular disease (HCC) 06/18/2019   Abnormal glucose 07/27/2018   Labile hypertension 07/27/2018   Fatty liver 10/09/2017   GERD (gastroesophageal reflux disease) 10/07/2017   Anxiety 10/07/2017   Hypothyroidism 07/23/2015   Vitamin D deficiency 10/03/2013   Medication management 10/03/2013   History of prediabetes 10/03/2013   ADD (attention deficit disorder) without hyperactivity 10/03/2013   Hyperlipidemia, mixed 04/04/2013   Sinusitis, chronic 04/04/2013   Migraine 09/06/2012   Spastic hemiplegia affecting left nondominant side (HCC) 09/06/2012   History of CVA (cerebrovascular accident)     ONSET DATE: 02/02/2022 (referral)  REFERRING DIAG:  M62.81 (ICD-10-CM) - Muscle weakness (generalized)  I63.9 (ICD-10-CM) - Cerebrovascular accident (CVA), unspecified mechanism (HCC)    THERAPY DIAG:  Muscle weakness (generalized)  Rationale for Evaluation and Treatment: Rehabilitation  SUBJECTIVE:   SUBJECTIVE STATEMENT: I want to not fall as much Pt accompanied by: self  PERTINENT HISTORY: 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  Compliant to med schedule to manage this and has returned to driving.   Botox to left hemibody 10/18.   PRECAUTIONS: Cervical and Fall-pt has stent in right internal carotid, h/o seizures   WEIGHT  BEARING RESTRICTIONS: No  PAIN:  Are you having pain?  Yes at neck from arthritis  FALLS: Has patient fallen in last 6 months? Yes. Number of falls 2  LIVING ENVIRONMENT: Lives with: lives with their spouse Lives in: House/apartment Stairs: Yes: Internal: 16 steps; on right going up and External: 3 steps; none Has following equipment at home: Grab bars and raised commode seat and removable show head  PLOF: Independent  PATIENT GOALS: be strong enough not to fall as easily  OBJECTIVE:   DIAGNOSTIC FINDINGS: Cervical X-rays 10/19X-rays demonstrate moderate multilevel degenerative changes with advanced changes C5-6 and C6-7  HAND DOMINANCE: Right  ADLs: : Eating: mod I Grooming: mod I  UB Dressing: mod I - slower LB Dressing: Mod I - slower Toileting: independent Bathing: independent Tub Shower transfers: mod I - walk in shower Equipment: Grab bars  IADLs: Shopping: independent w/ smaller purchases Light housekeeping: laundry, washing dishes Meal Prep: does some cooking - gets husband to help get things off stove, out of [employer] mobility: back to driving Medication management: independent Landscape architect: husband does Handwriting:  denies change  MOBILITY STATUS: Independent   UPPER EXTREMITY ROM:  RUE AROM WNL's. 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 Pt reports missing things right in front of her at times    OBSERVATIONS: mild dystonia Lt hand, decreased attention to Lt hand when using it and Lt foot at times for full toe clearance   TODAY'S TREATMENT:                                                                                                                              DATE: see below   PATIENT EDUCATION: Education details: OT findings, safety considerations and potential A/E needs (cut resistant glove, long silicone oven mitt, travel mug) Person educated: Patient Education method: Explanation and pt took pictures of A/E Education comprehension: verbalized understanding  HOME  EXERCISE PROGRAM: N/A   GOALS: Goals reviewed with patient? N/A   ASSESSMENT:  CLINICAL IMPRESSION: Patient is a 61 y.o. female who was seen today for occupational therapy evaluation w/ residual mild Lt hemiparesis from CVA in 2009. Pt had recent botox in October this year, however pt reports she has been getting botox for the past 10 years. Pt's main concerns are falling and neck pain which P.T. is addressing. Pt has no new O.T. concerns at this time.   PERFORMANCE DEFICITS: in functional skills including sensation and decreased knowledge of use of DME, cognitive skills including attention,    CO-MORBIDITIES: may have co-morbidities  that affects occupational performance. Patient will benefit from skilled OT to address above impairments and improve overall function.  MODIFICATION OR ASSISTANCE TO COMPLETE EVALUATION: No modification of tasks or assist necessary to complete an evaluation.  OT OCCUPATIONAL PROFILE AND HISTORY: Problem focused assessment: Including review of records relating to presenting problem.  CLINICAL DECISION MAKING: LOW - limited treatment options, no task modification necessary   EVALUATION COMPLEXITY: Low    PLAN:  OT FREQUENCY: one time  visit   RECOMMENDED OTHER SERVICES: none at this time  CONSULTED AND AGREED WITH PLAN OF CARE: Patient  PLAN FOR NEXT SESSION: N/A, one time evaluation   Sheran Lawless, OT 02/15/2022, 3:13 PM

## 2022-02-15 ENCOUNTER — Encounter: Payer: Self-pay | Admitting: Physical Therapy

## 2022-02-15 ENCOUNTER — Ambulatory Visit: Payer: Medicare Other | Admitting: Physical Therapy

## 2022-02-15 ENCOUNTER — Ambulatory Visit: Payer: Medicare Other | Admitting: Occupational Therapy

## 2022-02-15 ENCOUNTER — Encounter: Payer: Self-pay | Admitting: Occupational Therapy

## 2022-02-15 DIAGNOSIS — R2689 Other abnormalities of gait and mobility: Secondary | ICD-10-CM

## 2022-02-15 DIAGNOSIS — I639 Cerebral infarction, unspecified: Secondary | ICD-10-CM | POA: Diagnosis not present

## 2022-02-15 DIAGNOSIS — G43709 Chronic migraine without aura, not intractable, without status migrainosus: Secondary | ICD-10-CM | POA: Diagnosis not present

## 2022-02-15 DIAGNOSIS — R296 Repeated falls: Secondary | ICD-10-CM | POA: Diagnosis not present

## 2022-02-15 DIAGNOSIS — M6281 Muscle weakness (generalized): Secondary | ICD-10-CM

## 2022-02-15 DIAGNOSIS — M542 Cervicalgia: Secondary | ICD-10-CM

## 2022-02-15 DIAGNOSIS — R2681 Unsteadiness on feet: Secondary | ICD-10-CM

## 2022-02-15 DIAGNOSIS — I69954 Hemiplegia and hemiparesis following unspecified cerebrovascular disease affecting left non-dominant side: Secondary | ICD-10-CM | POA: Diagnosis not present

## 2022-02-15 NOTE — Patient Instructions (Signed)
Access Code: [email or web address] URL: [email or web address] Date: 02/15/2022 Prepared by: Camille Bal  Exercises - Backward Tandem Walking with Counter Support  - 1 x daily - 5 x weekly - 3 sets - 10 reps - Romberg Stance Eyes Closed on Foam Pad  - 1 x daily - 5 x weekly - 1 sets - 3 reps - 30 seconds hold - Supine Chin Tuck  - 1 x daily - 5 x weekly - 2 sets - 12 reps - Supine Cervical Retraction with Towel  - 1 x daily - 5 x weekly - 2 sets - 10 reps - Supine Suboccipital Release with Tennis Balls  - 1 x daily - 7 x weekly - 3 sets - 10 reps

## 2022-02-15 NOTE — Therapy (Signed)
OUTPATIENT PHYSICAL THERAPY NEURO TREATMENT   Patient Name: Ruth Gregory MRN: [id number] DOB:[birth year], 61 y.o., female Today's Date: 02/15/2022   PCP: Lucky Cowboy, MD REFERRING PROVIDER: Levert Feinstein, MD   PT End of Session - 02/15/22 1402     Visit Number 3    Number of Visits 11   10+eval   Date for PT Re-Evaluation 04/16/22    Authorization Type [employer] MEDICARE    Progress Note Due on Visit 10    PT Start Time 1402    PT Stop Time 1447    PT Time Calculation (min) 45 min    Activity Tolerance Patient tolerated treatment well    Behavior During Therapy WFL for tasks assessed/performed             Past Medical History:  Diagnosis Date   Anemia    Anxiety    Asthma    Chronic heartburn    Depression    Elevated cholesterol    on Crestor   Headache(784.0)    Hypertension    Hypothyroidism    S/P thyroidectomy   Migraine    Occlusion and stenosis of carotid artery with cerebral infarction    Spasm of muscle    Unspecified cerebral artery occlusion with cerebral infarction    Vitamin D deficiency    Past Surgical History:  Procedure Laterality Date   AUGMENTATION MAMMAPLASTY Bilateral 2000   COSMETIC SURGERY     CYSTOCELE REPAIR N/A 01/08/2013   Procedure: ANTERIOR REPAIR (CYSTOCELE);  Surgeon: Meriel Pica, MD;  Location: [facility];  Service: Gynecology;  Laterality: N/A;   NASAL SINUS SURGERY     VAGINAL HYSTERECTOMY     Patient Active Problem List   Diagnosis Date Noted   Cerebrovascular accident (CVA) (HCC) 02/02/2022   Muscle weakness (generalized) 02/02/2022   Gait abnormality 01/13/2022   Closed displaced fracture of proximal phalanx of lesser toe, initial encounter 10/21/2021   Squamous cell carcinoma in situ (SCCIS) of dorsum of left hand 09/11/2021   Arthritis of carpometacarpal [facility]) joint of right thumb 08/11/2021   Degenerative arthritis of distal interphalangeal joint of index finger of right hand 08/11/2021   Degenerative  arthritis of distal interphalangeal joint of middle finger of right hand 08/11/2021   Degenerative arthritis of distal interphalangeal joint of ring finger of right hand 08/11/2021   Partial symptomatic epilepsy with complex partial seizures, not intractable, without status epilepticus (HCC) 01/21/2021   B12 deficiency 09/18/2020   Chronic migraine w/o aura w/o status migrainosus, not intractable 04/15/2020   Spastic hemiplegia of left nondominant side as late effect of cerebrovascular disease (HCC) 06/18/2019   Abnormal glucose 07/27/2018   Labile hypertension 07/27/2018   Fatty liver 10/09/2017   GERD (gastroesophageal reflux disease) 10/07/2017   Anxiety 10/07/2017   Hypothyroidism 07/23/2015   Vitamin D deficiency 10/03/2013   Medication management 10/03/2013   History of prediabetes 10/03/2013   ADD (attention deficit disorder) without hyperactivity 10/03/2013   Hyperlipidemia, mixed 04/04/2013   Sinusitis, chronic 04/04/2013   Migraine 09/06/2012   Spastic hemiplegia affecting left nondominant side (HCC) 09/06/2012   History of CVA (cerebrovascular accident)     ONSET DATE: 01/13/2022 (referral)  REFERRING DIAG: J69.678 (ICD-10-CM) - Spastic hemiplegia of left nondominant side as late effect of cerebrovascular disease, unspecified cerebrovascular disease type (HCC) G43.709 (ICD-10-CM) - Chronic migraine w/o aura w/o status migrainosus, not intractable G40.209 (ICD-10-CM) - Partial symptomatic epilepsy with complex partial seizures, not intractable, without status epilepticus (HCC) M54.2 (ICD-10-CM) - Neck  pain  THERAPY DIAG:  Muscle weakness (generalized)  Other abnormalities of gait and mobility  Unsteadiness on feet  Repeated falls  Cervicalgia  Rationale for Evaluation and Treatment: Rehabilitation  SUBJECTIVE:                                                                                                                                                                                              SUBJECTIVE STATEMENT: My neck and shoulders were hurting me pretty bad.  I did a lot of housework and gardening the past couple of days which usually makes it worse.  I lost my balance yesterday when I was walking carrying a pot, I wobbled to the left, but regained my balance.   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  Compliant to med schedule to manage this and has returned to driving.  Botox to left hemibody 10/18.   PAIN:  Are you having pain? Yes: NPRS scale: 7/10 Pain location: middle of posterior neck Pain description: achy Aggravating factors: drastic movement Relieving factors: stretching, massage gun  PRECAUTIONS: Cervical and Fall-pt has stent in right internal carotid  WEIGHT BEARING RESTRICTIONS: No  FALLS: Has patient fallen in last 6 months? Yes. Number of falls 2-tripped over laundry basket and does not recall the second  LIVING ENVIRONMENT: Lives with: lives with their spouse Lives in: House/apartment Stairs: Yes: Internal: 16 steps; on right going up and External: 3 steps; none Has following equipment at home: Grab bars and raised commode seat and removable show head  PLOF: Independent  PATIENT GOALS: "To feel more comfortable about not falling."  OBJECTIVE:   TODAY'S TREATMENT:                                                                                                                              DATE: N/A  Pt is labile at onset of session discussing emotional topic about family and her PMH.  PT provides therapeutic listening.  Cervical and suboccipital release and MYF techniques > passive upper trap stretch 2x30 sec > prone  MYF release to periscapular and lower cervical paraspinals using tennis ball progressed to soft spike ball > manual trigger point release to right upper trap in sitting, discussed benefits of DN w/ pt expressing interest  -Upper trap stretch 3x45 sec each side -Levator stretch 3x45 sec each side -SCM stretch 3x45 sec each side  Edu on suboccipital release at home.  Added to HEP.  PT demonstrates setup of tennis balls per pt request.  PATIENT EDUCATION: Education details: Continue HEP. Person educated: Patient Education method: Explanation Education comprehension: verbalized understanding  HOME EXERCISE PROGRAM: Access Code: [email or web address] URL: [email or web address] Date: 02/08/2022 Prepared by: Elease Etienne  Exercises - Backward Tandem Walking with Counter Support  - 1 x daily - 5 x weekly - 3 sets - 10 reps - Romberg Stance Eyes Closed on Foam Pad  - 1 x daily - 5 x weekly - 1 sets - 3 reps - 30 seconds hold - Supine Chin Tuck  - 1 x daily - 5 x weekly - 2 sets - 12 reps - Supine Cervical Retraction with Towel  - 1 x daily - 5 x weekly - 2 sets - 10 reps - Supine Suboccipital Release with Tennis Balls  - 1 x daily - 7 x weekly - 3 sets - 10 reps  GOALS: Goals reviewed with patient? Yes  SHORT TERM GOALS: Target date: 03/05/2022  Pt will be independent with initial strength and balance HEP to maintain gains outside the clinic. Baseline:  To be established. Goal status: INITIAL  2.  Pt will improve NDI score to no more than 22/50 to indicate subjective improvement in functioning. Baseline: 26/50 Goal status: INITIAL  3.  Pt will be able to verbalize fall prevention strategies and will demonstrate independence with floor recovery. Baseline: Pt reports independence, PT yet to witness. Goal status: INITIAL  3.  Pt will maintain chin tuck >/=13 sec during deep neck flexor endurance test to demonstrate improved muscular endurance for supporting cervical  musculature. Baseline: 4.94 sec 11/13 Goal status: INITIAL  LONG TERM GOALS: Target date: 04/02/2021 (8 weeks); update for 12 weeks at assessment (04/16/2022)  Pt will be independent w/ finalized strength and balance and cervical mobility HEP to further improve mobility and pain. Baseline:  To be updated. Goal status: INITIAL  2.  Pt will improve FGA score to >/=25/30 in order to demonstrate improved balance and decreased fall risk. Baseline: 20/30 Goal status: INITIAL  3.  Pt will reports </=5 days of migraines per month to demonstrate subjective improvement in function. Baseline: reports 8-10 migraines per month Goal status: INITIAL  4.  Pt will improve NDI to score to no more than 18/50 to demonstrate subjective improvement in functioning. Baseline: 26/50 Goal status: INITIAL  5.  Pt will ambulate up and down 16 stairs modI level w/ reciprocal or step-to gait without LOB to improve safe mobility in home environment. Baseline: Pt can go reciprocally w/ right rail, but has lost balance at home several times resulting in a previous fall. Goal status: INITIAL  ASSESSMENT:  CLINICAL IMPRESSION: Focused on cervical mobility and limitations this session due to pt expressing more pain and flare up with recent increased physical activity at home.  She continues to have some loss of balance w/ manual dual task activities  at home and would benefit from further addressing of deficits as outlined in ongoing POC.  OBJECTIVE IMPAIRMENTS: Abnormal gait, decreased balance, decreased knowledge of use of DME, decreased strength, impaired sensation, impaired tone, impaired UE functional use, improper body mechanics, and postural dysfunction.   ACTIVITY LIMITATIONS: carrying, lifting, stairs, and locomotion level  PARTICIPATION LIMITATIONS: community activity  PERSONAL FACTORS: Age, Fitness, Past/current experiences, Time since onset of injury/illness/exacerbation, and 1-2 comorbidities: anxiety and  Fuch's disease  are also affecting patient's functional outcome.   REHAB POTENTIAL: Excellent  CLINICAL DECISION MAKING: Evolving/moderate complexity  EVALUATION COMPLEXITY: Moderate  PLAN:  PT FREQUENCY: 1x/week  PT DURATION: 10 weeks  PLANNED INTERVENTIONS: Therapeutic exercises, Therapeutic activity, Neuromuscular re-education, Balance training, Gait training, Patient/Family education, Self Care, Joint mobilization, Stair training, Vestibular training, Dry Needling, Manual therapy, and Re-evaluation  PLAN FOR NEXT SESSION:  Modify HEP prn-deficits from FGA and cervical mobility/strength/MYF techniques, chair yoga, upper trap/levator/SCM stretches; schedule time for DN!, focus on balance next session, postural exercise, dual tasking   Bary Richard, PT, DPT 02/15/2022, 2:55 PM

## 2022-02-16 ENCOUNTER — Telehealth: Payer: Self-pay | Admitting: Nurse Practitioner

## 2022-02-16 ENCOUNTER — Other Ambulatory Visit: Payer: Self-pay | Admitting: Nurse Practitioner

## 2022-02-16 DIAGNOSIS — F988 Other specified behavioral and emotional disorders with onset usually occurring in childhood and adolescence: Secondary | ICD-10-CM

## 2022-02-16 MED ORDER — AMPHETAMINE-DEXTROAMPHETAMINE 10 MG PO TABS: ORAL_TABLET | ORAL | 0 refills | Status: DC

## 2022-02-16 NOTE — Telephone Encounter (Signed)
Pt is requesting a refill on Adderall

## 2022-02-22 ENCOUNTER — Ambulatory Visit: Payer: Medicare Other | Admitting: Physical Therapy

## 2022-02-22 ENCOUNTER — Encounter: Payer: Self-pay | Admitting: Physical Therapy

## 2022-02-22 DIAGNOSIS — I69954 Hemiplegia and hemiparesis following unspecified cerebrovascular disease affecting left non-dominant side: Secondary | ICD-10-CM | POA: Diagnosis not present

## 2022-02-22 DIAGNOSIS — I639 Cerebral infarction, unspecified: Secondary | ICD-10-CM | POA: Diagnosis not present

## 2022-02-22 DIAGNOSIS — R2689 Other abnormalities of gait and mobility: Secondary | ICD-10-CM

## 2022-02-22 DIAGNOSIS — M542 Cervicalgia: Secondary | ICD-10-CM | POA: Diagnosis not present

## 2022-02-22 DIAGNOSIS — R296 Repeated falls: Secondary | ICD-10-CM

## 2022-02-22 DIAGNOSIS — M6281 Muscle weakness (generalized): Secondary | ICD-10-CM | POA: Diagnosis not present

## 2022-02-22 DIAGNOSIS — R2681 Unsteadiness on feet: Secondary | ICD-10-CM | POA: Diagnosis not present

## 2022-02-22 DIAGNOSIS — G43709 Chronic migraine without aura, not intractable, without status migrainosus: Secondary | ICD-10-CM | POA: Diagnosis not present

## 2022-02-22 NOTE — Therapy (Signed)
OUTPATIENT PHYSICAL THERAPY NEURO TREATMENT   Patient Name: Ruth Gregory MRN: [id number] DOB:January 31, 1961, 61 y.o., female Today's Date: 02/22/2022   PCP: Lucky Cowboy, MD REFERRING PROVIDER: Levert Feinstein, MD   PT End of Session - 02/22/22 1325     Visit Number 4    Number of Visits 11   10+eval   Date for PT Re-Evaluation 04/16/22    Authorization Type [employer] MEDICARE    Progress Note Due on Visit 10    PT Start Time 1324   pt late   PT Stop Time 1359    PT Time Calculation (min) 35 min    Activity Tolerance Patient tolerated treatment well    Behavior During Therapy WFL for tasks assessed/performed             Past Medical History:  Diagnosis Date   Anemia    Anxiety    Asthma    Chronic heartburn    Depression    Elevated cholesterol    on Crestor   Headache(784.0)    Hypertension    Hypothyroidism    S/P thyroidectomy   Migraine    Occlusion and stenosis of carotid artery with cerebral infarction    Spasm of muscle    Unspecified cerebral artery occlusion with cerebral infarction    Vitamin D deficiency    Past Surgical History:  Procedure Laterality Date   AUGMENTATION MAMMAPLASTY Bilateral 2000   COSMETIC SURGERY     CYSTOCELE REPAIR N/A 01/08/2013   Procedure: ANTERIOR REPAIR (CYSTOCELE);  Surgeon: Meriel Pica, MD;  Location: [facility];  Service: Gynecology;  Laterality: N/A;   NASAL SINUS SURGERY     VAGINAL HYSTERECTOMY     Patient Active Problem List   Diagnosis Date Noted   Cerebrovascular accident (CVA) (HCC) 02/02/2022   Muscle weakness (generalized) 02/02/2022   Gait abnormality 01/13/2022   Closed displaced fracture of proximal phalanx of lesser toe, initial encounter 10/21/2021   Squamous cell carcinoma in situ (SCCIS) of dorsum of left hand 09/11/2021   Arthritis of carpometacarpal [facility]) joint of right thumb 08/11/2021   Degenerative arthritis of distal interphalangeal joint of index finger of right hand 08/11/2021    Degenerative arthritis of distal interphalangeal joint of middle finger of right hand 08/11/2021   Degenerative arthritis of distal interphalangeal joint of ring finger of right hand 08/11/2021   Partial symptomatic epilepsy with complex partial seizures, not intractable, without status epilepticus (HCC) 01/21/2021   B12 deficiency 09/18/2020   Chronic migraine w/o aura w/o status migrainosus, not intractable 04/15/2020   Spastic hemiplegia of left nondominant side as late effect of cerebrovascular disease (HCC) 06/18/2019   Abnormal glucose 07/27/2018   Labile hypertension 07/27/2018   Fatty liver 10/09/2017   GERD (gastroesophageal reflux disease) 10/07/2017   Anxiety 10/07/2017   Hypothyroidism 07/23/2015   Vitamin D deficiency 10/03/2013   Medication management 10/03/2013   History of prediabetes 10/03/2013   ADD (attention deficit disorder) without hyperactivity 10/03/2013   Hyperlipidemia, mixed 04/04/2013   Sinusitis, chronic 04/04/2013   Migraine 09/06/2012   Spastic hemiplegia affecting left nondominant side (HCC) 09/06/2012   History of CVA (cerebrovascular accident)     ONSET DATE: 01/13/2022 (referral)  REFERRING DIAG: W09.811 (ICD-10-CM) - Spastic hemiplegia of left nondominant side as late effect of cerebrovascular disease, unspecified cerebrovascular disease type (HCC) G43.709 (ICD-10-CM) - Chronic migraine w/o aura w/o status migrainosus, not intractable G40.209 (ICD-10-CM) - Partial symptomatic epilepsy with complex partial seizures, not intractable, without status epilepticus (HCC) M54.2 (  ICD-10-CM) - Neck pain  THERAPY DIAG:  Muscle weakness (generalized)  Unsteadiness on feet  Other abnormalities of gait and mobility  Repeated falls  Cervicalgia  Rationale for Evaluation and Treatment: Rehabilitation  SUBJECTIVE:                                                                                                                                                                                              SUBJECTIVE STATEMENT: Had a good holiday, cooked a lot.  No changes, but has a migraine today, presents wearing "migraine glasses" which are rose tinted which she states helps her photophobia.   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  Compliant to med schedule to manage this and has returned to driving.  Botox to left hemibody 10/18.   PAIN:  Are you having pain? Yes: NPRS scale: 3/10 Pain location: top center of head and forehead Pain description: migraine Aggravating factors: light Relieving factors: medicine  PRECAUTIONS: Cervical and Fall-pt has stent in right internal carotid  WEIGHT BEARING RESTRICTIONS: No  FALLS: Has patient fallen in last 6 months? Yes. Number of falls 2-tripped over laundry basket and does not recall the second  LIVING ENVIRONMENT: Lives with: lives with their spouse Lives in: House/apartment Stairs: Yes: Internal: 16 steps; on right going up and External: 3 steps; none Has following equipment at home: Grab bars and raised commode seat and removable show head  PLOF: Independent  PATIENT GOALS: "To feel more comfortable about not falling."  OBJECTIVE:   TODAY'S TREATMENT:                                                                                                                              DATE: N/A  Discussed dry needling and scheduled time in future session for this per pt preference near Christmas time when  stress is worse.  Chair yoga: -Diaphragmatic breathing using lower abdominal tactile cue x10 -Cat/cow x15 w/ return demo -Circles x10 clockwise and counterclockwise -Sun salutation x10 > w/  twist x10 each side -High altar side leans 10 x 3 sec hold each side -Assisted neck stretches 5x10 sec each side -Ankle to knee x30sec each side -Forward fold 2x15 sec w/ edu on paced breathing to facilitate stretch -Goddess w/ a twist x5 w/ 2 sec hold each side -Warrior 2 w/ edu on posture and positioning of arms as well as progressing challenge w/ lunge position  PATIENT EDUCATION: Education details: Continue HEP. Person educated: Patient Education method: Explanation Education comprehension: verbalized understanding  HOME EXERCISE PROGRAM: Access Code: [email or web address] URL: [email or web address] Date: 02/08/2022 Prepared by: Camille BalMarissa Adrean Findlay  Exercises - Backward Tandem Walking with Counter Support  - 1 x daily - 5 x weekly - 3 sets - 10 reps - Romberg Stance Eyes Closed on Foam Pad  - 1 x daily - 5 x weekly - 1 sets - 3 reps - 30 seconds hold - Supine Chin Tuck  - 1 x daily - 5 x weekly - 2 sets - 12 reps - Supine Cervical Retraction with Towel  - 1 x daily - 5 x weekly - 2 sets - 10 reps - Supine Suboccipital Release with Tennis Balls  - 1 x daily - 7 x weekly - 3 sets - 10 reps -pt has not purchased tennis balls, but uses fingertips  Chair yoga handout-wrote frequency and highlighted positions reviewed 02/22/2022.  GOALS: Goals reviewed with patient? Yes  SHORT TERM GOALS: Target date: 03/05/2022  Pt will be independent with initial strength and balance HEP to maintain gains outside the clinic. Baseline:  To be established. Goal status: INITIAL  2.  Pt will improve NDI score to no more than 22/50 to indicate subjective improvement in functioning. Baseline: 26/50 Goal status: INITIAL  3.  Pt will be able to verbalize fall prevention strategies and will demonstrate independence with floor recovery. Baseline: Pt reports independence, PT yet to witness. Goal status: INITIAL  3.  Pt will maintain chin tuck >/=13 sec during deep neck flexor endurance test to  demonstrate improved muscular endurance for supporting cervical musculature. Baseline: 4.94 sec 11/13 Goal status: INITIAL  LONG TERM GOALS: Target date: 04/02/2021 (8 weeks); update for 12 weeks at assessment (04/16/2022)  Pt will be independent w/ finalized strength and balance and cervical mobility HEP to further improve mobility and pain. Baseline:  To be updated. Goal status: INITIAL  2.  Pt will improve FGA score to >/=25/30 in order to demonstrate improved balance and decreased fall risk. Baseline: 20/30 Goal status: INITIAL  3.  Pt will reports </=5 days of migraines per month to demonstrate subjective improvement in function. Baseline: reports 8-10 migraines per month Goal status: INITIAL  4.  Pt will improve NDI to score to no more than 18/50 to demonstrate subjective improvement in functioning. Baseline: 26/50 Goal status: INITIAL  5.  Pt will ambulate up and down 16 stairs modI level w/ reciprocal or step-to gait without LOB to improve safe mobility in home environment. Baseline: Pt can go reciprocally w/ right rail, but has lost balance at home several times resulting in a previous fall. Goal status: INITIAL  ASSESSMENT:  CLINICAL IMPRESSION: Reviewed chair yoga this session to promote pt interest and return to general activity that might benefit stress management and cervicogenic headaches.  Discussed dry needling in future session with time scheduled to do  so.  Will continue to progress towards LTGs as able per ongoing skilled PT POC.  OBJECTIVE IMPAIRMENTS: Abnormal gait, decreased balance, decreased knowledge of use of DME, decreased strength, impaired sensation, impaired tone, impaired UE functional use, improper body mechanics, and postural dysfunction.   ACTIVITY LIMITATIONS: carrying, lifting, stairs, and locomotion level  PARTICIPATION LIMITATIONS: community activity  PERSONAL FACTORS: Age, Fitness, Past/current experiences, Time since onset of  injury/illness/exacerbation, and 1-2 comorbidities: anxiety and Fuch's disease  are also affecting patient's functional outcome.   REHAB POTENTIAL: Excellent  CLINICAL DECISION MAKING: Evolving/moderate complexity  EVALUATION COMPLEXITY: Moderate  PLAN:  PT FREQUENCY: 1x/week  PT DURATION: 10 weeks  PLANNED INTERVENTIONS: Therapeutic exercises, Therapeutic activity, Neuromuscular re-education, Balance training, Gait training, Patient/Family education, Self Care, Joint mobilization, Stair training, Vestibular training, Dry Needling, Manual therapy, and Re-evaluation  PLAN FOR NEXT SESSION:  Modify HEP prn-deficits from FGA and cervical mobility/strength/MYF techniques, upper trap/levator/SCM stretches; DN on Dec 18, focus on balance next session, postural exercise, dual tasking   Sadie Haber, PT, DPT 02/22/2022, 5:25 PM

## 2022-02-23 ENCOUNTER — Ambulatory Visit: Payer: Medicare Other

## 2022-02-25 ENCOUNTER — Ambulatory Visit
Admission: RE | Admit: 2022-02-25 | Discharge: 2022-02-25 | Disposition: A | Discharge status: Home / Self Care | Payer: Medicare Other | Source: Ambulatory Visit | Attending: Internal Medicine | Admitting: Internal Medicine

## 2022-02-25 DIAGNOSIS — Z6824 Body mass index (BMI) 24.0-24.9, adult: Secondary | ICD-10-CM | POA: Diagnosis not present

## 2022-02-25 DIAGNOSIS — Z1231 Encounter for screening mammogram for malignant neoplasm of breast: Secondary | ICD-10-CM

## 2022-03-01 ENCOUNTER — Ambulatory Visit: Payer: Medicare Other | Admitting: Physical Therapy

## 2022-03-02 ENCOUNTER — Ambulatory Visit: Payer: Medicare Other | Admitting: Physical Therapy

## 2022-03-08 ENCOUNTER — Encounter: Payer: Self-pay | Admitting: Physical Therapy

## 2022-03-08 ENCOUNTER — Ambulatory Visit: Payer: Medicare Other | Admitting: Occupational Therapy

## 2022-03-08 ENCOUNTER — Ambulatory Visit: Payer: Medicare Other | Attending: Neurology | Admitting: Physical Therapy

## 2022-03-08 DIAGNOSIS — R296 Repeated falls: Secondary | ICD-10-CM | POA: Diagnosis not present

## 2022-03-08 DIAGNOSIS — I639 Cerebral infarction, unspecified: Secondary | ICD-10-CM

## 2022-03-08 DIAGNOSIS — R2689 Other abnormalities of gait and mobility: Secondary | ICD-10-CM

## 2022-03-08 DIAGNOSIS — R2681 Unsteadiness on feet: Secondary | ICD-10-CM

## 2022-03-08 DIAGNOSIS — M542 Cervicalgia: Secondary | ICD-10-CM | POA: Diagnosis not present

## 2022-03-08 DIAGNOSIS — M6281 Muscle weakness (generalized): Secondary | ICD-10-CM | POA: Diagnosis not present

## 2022-03-08 NOTE — Therapy (Signed)
OUTPATIENT PHYSICAL THERAPY NEURO TREATMENT   Patient Name: Ruth Gregory MRN: [id number] DOB:07-22-1960, 61 y.o., female Today's Date: 03/08/2022   PCP: Unk Pinto, MD REFERRING PROVIDER: Marcial Pacas, MD   PT End of Session - 03/08/22 1617     Visit Number 5    Number of Visits 11   10+eval   Date for PT Re-Evaluation 04/16/22    Authorization Type [employer] MEDICARE    Progress Note Due on Visit 10    PT Start Time 1615    PT Stop Time 1700    PT Time Calculation (min) 45 min    Activity Tolerance Patient tolerated treatment well    Behavior During Therapy WFL for tasks assessed/performed             Past Medical History:  Diagnosis Date   Anemia    Anxiety    Asthma    Chronic heartburn    Depression    Elevated cholesterol    on Crestor   Headache(784.0)    Hypertension    Hypothyroidism    S/P thyroidectomy   Migraine    Occlusion and stenosis of carotid artery with cerebral infarction    Spasm of muscle    Unspecified cerebral artery occlusion with cerebral infarction    Vitamin D deficiency    Past Surgical History:  Procedure Laterality Date   AUGMENTATION MAMMAPLASTY Bilateral 2000   COSMETIC SURGERY     CYSTOCELE REPAIR N/A 01/08/2013   Procedure: ANTERIOR REPAIR (CYSTOCELE);  Surgeon: Margarette Asal, MD;  Location: [facility];  Service: Gynecology;  Laterality: N/A;   NASAL SINUS SURGERY     VAGINAL HYSTERECTOMY     Patient Active Problem List   Diagnosis Date Noted   Cerebrovascular accident (CVA) ([place]) 02/02/2022   Muscle weakness (generalized) 02/02/2022   Gait abnormality 01/13/2022   Closed displaced fracture of proximal phalanx of lesser toe, initial encounter 10/21/2021   Squamous cell carcinoma in situ (SCCIS) of dorsum of left hand 09/11/2021   Arthritis of carpometacarpal [facility]) joint of right thumb 08/11/2021   Degenerative arthritis of distal interphalangeal joint of index finger of right hand 08/11/2021   Degenerative  arthritis of distal interphalangeal joint of middle finger of right hand 08/11/2021   Degenerative arthritis of distal interphalangeal joint of ring finger of right hand 08/11/2021   Partial symptomatic epilepsy with complex partial seizures, not intractable, without status epilepticus ([place]) 01/21/2021   B12 deficiency 09/18/2020   Chronic migraine w/o aura w/o status migrainosus, not intractable 04/15/2020   Spastic hemiplegia of left nondominant side as late effect of cerebrovascular disease ([place]) 06/18/2019   Abnormal glucose 07/27/2018   Labile hypertension 07/27/2018   Fatty liver 10/09/2017   GERD (gastroesophageal reflux disease) 10/07/2017   Anxiety 10/07/2017   Hypothyroidism 07/23/2015   Vitamin D deficiency 10/03/2013   Medication management 10/03/2013   History of prediabetes 10/03/2013   ADD (attention deficit disorder) without hyperactivity 10/03/2013   Hyperlipidemia, mixed 04/04/2013   Sinusitis, chronic 04/04/2013   Migraine 09/06/2012   Spastic hemiplegia affecting left nondominant side ([place]) 09/06/2012   History of CVA (cerebrovascular accident)     ONSET DATE: 01/13/2022 (referral)  REFERRING DIAG: E99.371 (ICD-10-CM) - Spastic hemiplegia of left nondominant side as late effect of cerebrovascular disease, unspecified cerebrovascular disease type ([place]) G43.709 (ICD-10-CM) - Chronic migraine w/o aura w/o status migrainosus, not intractable G40.209 (ICD-10-CM) - Partial symptomatic epilepsy with complex partial seizures, not intractable, without status epilepticus ([place]) M54.2 (ICD-10-CM) - Neck  pain  THERAPY DIAG:  Muscle weakness (generalized)  Unsteadiness on feet  Other abnormalities of gait and mobility  Repeated falls  Cervicalgia  Cerebrovascular accident (CVA), unspecified mechanism ([place])  Rationale for Evaluation and Treatment: Rehabilitation  SUBJECTIVE:                                                                                                                                                                                              SUBJECTIVE STATEMENT: Pt states her neck pain has been somewhat better especially when looking down at her phone. 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  Compliant to med schedule to manage this and has returned to driving.  Botox to left hemibody 10/18.   PAIN:  Are you having pain? Yes: NPRS scale: 1/10 Pain location: neck Pain description: migraine Aggravating factors: light Relieving factors: medicine  PRECAUTIONS: Cervical and Fall-pt has stent in right internal carotid  WEIGHT BEARING RESTRICTIONS: No  FALLS: Has patient fallen in last 6 months? Yes. Number of falls 2-tripped over laundry basket and does not recall the second  LIVING ENVIRONMENT: Lives with: lives with their spouse Lives in: House/apartment Stairs: Yes: Internal: 16 steps; on right going up and External: 3 steps; none Has following equipment at home: Grab bars and raised commode seat and removable show head  PLOF: Independent  PATIENT GOALS: "To feel more comfortable about not falling."  OBJECTIVE:   TODAY'S TREATMENT:                                                                                                                              DATE:  -Administered NDI: 29/50 = 58% -Pt demonstrates independence w/ standing<>floor transfer and teachback of general fall prevention strategies -Cervical deep neck flexor endurance test:  12.47 sec Reviewed HEP: -Supine chin tucks x12 -Supine cervical  retraction into towel 12x3sec hold each -Pt demonstrates suboccipital release w/ fingertips in supine -Tandem on airex  w/ eyes closed x24 sec, x46 sec, x34 sec, x57 sec, x49 sec -Forwards and backwards tandem ambulation w/o UE support, pt continues to be challenged by maintaining BLE contact during ambulation, she states she has not practiced the balance exercises much, both appear challenging this visit. Reprinted HEP this visit per pt request.  PATIENT EDUCATION: Education details: Progress towards goals. Person educated: Patient Education method: Explanation Education comprehension: verbalized understanding  HOME EXERCISE PROGRAM: Access Code: [email or web address] URL: [email or web address] Date: 02/08/2022 Prepared by: Elease Etienne  Exercises - Backward Tandem Walking with Counter Support  - 1 x daily - 5 x weekly - 3 sets - 10 reps - Romberg Stance Eyes Closed on Foam Pad  - 1 x daily - 5 x weekly - 1 sets - 3 reps - 30 seconds hold - Supine Chin Tuck  - 1 x daily - 5 x weekly - 2 sets - 12 reps - Supine Cervical Retraction with Towel  - 1 x daily - 5 x weekly - 2 sets - 10 reps - Supine Suboccipital Release with Tennis Balls  - 1 x daily - 7 x weekly - 3 sets - 10 reps -pt has not purchased tennis balls, but uses fingertips  Chair yoga handout-wrote frequency and highlighted positions reviewed 02/22/2022.  GOALS: Goals reviewed with patient? Yes  SHORT TERM GOALS: Target date: 03/05/2022  Pt will be independent with initial strength and balance HEP to maintain gains outside the clinic. Baseline:  To be established.  Reviewed in part on 12/11, pt compliant to updates at home. Goal status: MET  2.  Pt will improve NDI score to no more than 22/50 to indicate subjective improvement in functioning. Baseline: 26/50; 29/50 (12/11) Goal status: NOT MET  3.  Pt will be able to verbalize fall prevention strategies and will demonstrate independence with floor recovery. Baseline: Pt independent w/ fall recovery and strategies. Goal status: MET  3.  Pt will maintain chin tuck >/=13 sec during  deep neck flexor endurance test to demonstrate improved muscular endurance for supporting cervical musculature. Baseline: 4.94 sec 11/13; 12.47 sec (12/11) Goal status: IN PROGRESS  LONG TERM GOALS: Target date: 04/02/2021 (8 weeks); update for 12 weeks at assessment (04/16/2022)  Pt will be independent w/ finalized strength and balance and cervical mobility HEP to further improve mobility and pain. Baseline:  To be updated. Goal status: INITIAL  2.  Pt will improve FGA score to >/=25/30 in order to demonstrate improved balance and decreased fall risk. Baseline: 20/30 Goal status: INITIAL  3.  Pt will reports </=5 days of migraines per month to demonstrate subjective improvement in function. Baseline: reports 8-10 migraines per month Goal status: INITIAL  4.  Pt will improve NDI to score to no more than 18/50 to demonstrate subjective improvement in functioning. Baseline: 26/50 Goal status: INITIAL  5.  Pt will ambulate up and down 16 stairs modI level w/ reciprocal or step-to gait without LOB to improve safe mobility in home environment. Baseline: Pt can go reciprocally w/ right rail, but has lost balance at home several times resulting in a previous fall. Goal status: INITIAL  ASSESSMENT:  CLINICAL IMPRESSION: Assessed STGs this session with review of initial HEP, balance exercises remain appropriately challenging for patient as she has not been able to adequately practice these at home.  Her NDI score increased indicating a perceived worsening of her neck pain  and headaches contrary to general reports of improvement.  She improved her cervical neck flexor endurance to almost normal and was independent with fall recovery demonstration and verbalization of fall prevention strategies.  She continues to benefit from skilled PT for further pain management.  OBJECTIVE IMPAIRMENTS: Abnormal gait, decreased balance, decreased knowledge of use of DME, decreased strength, impaired sensation,  impaired tone, impaired UE functional use, improper body mechanics, and postural dysfunction.   ACTIVITY LIMITATIONS: carrying, lifting, stairs, and locomotion level  PARTICIPATION LIMITATIONS: community activity  PERSONAL FACTORS: Age, Fitness, Past/current experiences, Time since onset of injury/illness/exacerbation, and 1-2 comorbidities: anxiety and Fuch's disease  are also affecting patient's functional outcome.   REHAB POTENTIAL: Excellent  CLINICAL DECISION MAKING: Evolving/moderate complexity  EVALUATION COMPLEXITY: Moderate  PLAN:  PT FREQUENCY: 1x/week  PT DURATION: 10 weeks  PLANNED INTERVENTIONS: Therapeutic exercises, Therapeutic activity, Neuromuscular re-education, Balance training, Gait training, Patient/Family education, Self Care, Joint mobilization, Stair training, Vestibular training, Dry Needling, Manual therapy, and Re-evaluation  PLAN FOR NEXT SESSION:  Modify HEP prn-deficits from FGA and cervical mobility/strength/MYF techniques, upper trap/levator/SCM stretches; DN on Dec 18, focus on balance next session, postural exercise, dual tasking   Bary Richard, PT, DPT 03/08/2022, 5:01 PM

## 2022-03-14 DIAGNOSIS — J208 Acute bronchitis due to other specified organisms: Secondary | ICD-10-CM | POA: Diagnosis not present

## 2022-03-14 DIAGNOSIS — J40 Bronchitis, not specified as acute or chronic: Secondary | ICD-10-CM | POA: Diagnosis not present

## 2022-03-14 DIAGNOSIS — R059 Cough, unspecified: Secondary | ICD-10-CM | POA: Diagnosis not present

## 2022-03-14 DIAGNOSIS — J45998 Other asthma: Secondary | ICD-10-CM | POA: Diagnosis not present

## 2022-03-15 ENCOUNTER — Ambulatory Visit: Payer: Medicare Other | Admitting: Physical Therapy

## 2022-03-15 ENCOUNTER — Ambulatory Visit: Payer: Medicare Other | Admitting: Occupational Therapy

## 2022-03-15 DIAGNOSIS — H16103 Unspecified superficial keratitis, bilateral: Secondary | ICD-10-CM | POA: Diagnosis not present

## 2022-03-15 NOTE — Therapy (Incomplete)
OUTPATIENT PHYSICAL THERAPY NEURO TREATMENT   Patient Name: Ruth Gregory MRN: [id number] DOB:03-03-1961, 61 y.o., female Today's Date: 03/15/2022   PCP: Unk Pinto, MD REFERRING PROVIDER: Marcial Pacas, MD     Past Medical History:  Diagnosis Date   Anemia    Anxiety    Asthma    Chronic heartburn    Depression    Elevated cholesterol    on Crestor   Headache(784.0)    Hypertension    Hypothyroidism    S/P thyroidectomy   Migraine    Occlusion and stenosis of carotid artery with cerebral infarction    Spasm of muscle    Unspecified cerebral artery occlusion with cerebral infarction    Vitamin D deficiency    Past Surgical History:  Procedure Laterality Date   AUGMENTATION MAMMAPLASTY Bilateral 2000   COSMETIC SURGERY     CYSTOCELE REPAIR N/A 01/08/2013   Procedure: ANTERIOR REPAIR (CYSTOCELE);  Surgeon: Margarette Asal, MD;  Location: [facility];  Service: Gynecology;  Laterality: N/A;   NASAL SINUS SURGERY     VAGINAL HYSTERECTOMY     Patient Active Problem List   Diagnosis Date Noted   Cerebrovascular accident (CVA) ([place]) 02/02/2022   Muscle weakness (generalized) 02/02/2022   Gait abnormality 01/13/2022   Closed displaced fracture of proximal phalanx of lesser toe, initial encounter 10/21/2021   Squamous cell carcinoma in situ (SCCIS) of dorsum of left hand 09/11/2021   Arthritis of carpometacarpal [facility]) joint of right thumb 08/11/2021   Degenerative arthritis of distal interphalangeal joint of index finger of right hand 08/11/2021   Degenerative arthritis of distal interphalangeal joint of middle finger of right hand 08/11/2021   Degenerative arthritis of distal interphalangeal joint of ring finger of right hand 08/11/2021   Partial symptomatic epilepsy with complex partial seizures, not intractable, without status epilepticus ([place]) 01/21/2021   B12 deficiency 09/18/2020   Chronic migraine w/o aura w/o status migrainosus, not intractable 04/15/2020    Spastic hemiplegia of left nondominant side as late effect of cerebrovascular disease ([place]) 06/18/2019   Abnormal glucose 07/27/2018   Labile hypertension 07/27/2018   Fatty liver 10/09/2017   GERD (gastroesophageal reflux disease) 10/07/2017   Anxiety 10/07/2017   Hypothyroidism 07/23/2015   Vitamin D deficiency 10/03/2013   Medication management 10/03/2013   History of prediabetes 10/03/2013   ADD (attention deficit disorder) without hyperactivity 10/03/2013   Hyperlipidemia, mixed 04/04/2013   Sinusitis, chronic 04/04/2013   Migraine 09/06/2012   Spastic hemiplegia affecting left nondominant side ([place]) 09/06/2012   History of CVA (cerebrovascular accident)     ONSET DATE: 01/13/2022 (referral)  REFERRING DIAG: J33.545 (ICD-10-CM) - Spastic hemiplegia of left nondominant side as late effect of cerebrovascular disease, unspecified cerebrovascular disease type ([place]) G43.709 (ICD-10-CM) - Chronic migraine w/o aura w/o status migrainosus, not intractable G40.209 (ICD-10-CM) - Partial symptomatic epilepsy with complex partial seizures, not intractable, without status epilepticus ([place]) M54.2 (ICD-10-CM) - Neck pain  THERAPY DIAG:  No diagnosis found.  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  Compliant to med schedule to manage this and has returned to driving.  Botox to left hemibody 10/18.   PAIN:  Are  you having pain? Yes: NPRS scale: 1/10 Pain location: neck Pain description: migraine Aggravating factors: light Relieving factors: medicine  PRECAUTIONS: Cervical and Fall-pt has stent in right internal carotid  WEIGHT BEARING RESTRICTIONS: No  FALLS: Has patient fallen in last 6 months? Yes. Number of falls 2-tripped over laundry basket and does not recall the second  LIVING ENVIRONMENT: Lives with: lives with their spouse Lives in: House/apartment Stairs: Yes: Internal: 16 steps; on right going up and External: 3 steps; none Has following equipment at home: Grab bars and raised commode seat and removable show head  PLOF: Independent  PATIENT GOALS: "To feel more comfortable about not falling."  OBJECTIVE:   TODAY'S TREATMENT:                                                                                                                              DATE:   THER ACT:  Trigger Point Dry-Needling  Treatment instructions: Expect mild to moderate muscle soreness. S/S of pneumothorax if dry needled over a lung field, and to seek immediate medical attention should they occur. Patient verbalized understanding of these instructions and education.  Patient Consent Given: {TPDNConsent:26509} Education handout provided: {TPDNEducationHandount:26511} Muscles treated: *** Treatment response/outcome: ***    Trigger Point Dry Needling  What is Trigger Point Dry Needling (DN)? DN is a physical therapy technique used to treat muscle pain and dysfunction. Specifically, DN helps deactivate muscle trigger points (muscle knots).  A thin filiform needle is used to penetrate the skin and stimulate the underlying trigger point. The goal is for a local twitch response (LTR) to occur and for the trigger point to relax. No medication of any kind is injected during the procedure.   What Does Trigger Point Dry Needling Feel Like?  The procedure feels different for each individual patient. Some  patients report that they do not actually feel the needle enter the skin and overall the process is not painful. Very mild bleeding may occur. However, many patients feel a deep cramping in the muscle in which the needle was inserted. This is the local twitch response.   How Will I feel after the treatment? Soreness is normal, and the onset of soreness may not occur for a few hours. Typically this soreness does not last longer than two days.  Bruising is uncommon, however; ice can be used to decrease any possible bruising.  In rare cases feeling tired or nauseous after the treatment is normal. In addition, your symptoms may get worse before  they get better, this period will typically not last longer than 24 hours.   What Can I do After My Treatment? Increase your hydration by drinking more water for the next 24 hours. You may place ice or heat on the areas treated that have become sore, however, do not use heat on inflamed or bruised areas. Heat often brings more relief post needling. You can continue your regular activities, but vigorous activity is not recommended initially after the treatment for 24 hours. DN is best combined with other physical therapy such as strengthening, stretching, and other therapies.    THER EX:  PATIENT EDUCATION: Education details: Progress towards goals. Person educated: Patient Education method: Explanation Education comprehension: verbalized understanding  HOME EXERCISE PROGRAM: Access Code: [email or web address] URL: [email or web address] Date: 02/08/2022 Prepared by: Elease Etienne  Exercises - Backward Tandem Walking with Counter Support  - 1 x daily - 5 x weekly - 3 sets - 10 reps - Romberg Stance Eyes Closed on Foam Pad  - 1 x daily - 5 x weekly - 1 sets - 3 reps - 30 seconds hold - Supine Chin Tuck  - 1 x daily - 5 x weekly - 2 sets - 12 reps - Supine Cervical Retraction with Towel  - 1 x daily - 5 x weekly - 2 sets - 10 reps - Supine  Suboccipital Release with Tennis Balls  - 1 x daily - 7 x weekly - 3 sets - 10 reps -pt has not purchased tennis balls, but uses fingertips  Chair yoga handout-wrote frequency and highlighted positions reviewed 02/22/2022.  GOALS: Goals reviewed with patient? Yes  SHORT TERM GOALS: Target date: 03/05/2022  Pt will be independent with initial strength and balance HEP to maintain gains outside the clinic. Baseline:  To be established.  Reviewed in part on 12/11, pt compliant to updates at home. Goal status: MET  2.  Pt will improve NDI score to no more than 22/50 to indicate subjective improvement in functioning. Baseline: 26/50; 29/50 (12/11) Goal status: NOT MET  3.  Pt will be able to verbalize fall prevention strategies and will demonstrate independence with floor recovery. Baseline: Pt independent w/ fall recovery and strategies. Goal status: MET  3.  Pt will maintain chin tuck >/=13 sec during deep neck flexor endurance test to demonstrate improved muscular endurance for supporting cervical musculature. Baseline: 4.94 sec 11/13; 12.47 sec (12/11) Goal status: IN PROGRESS  LONG TERM GOALS: Target date: 04/02/2021 (8 weeks); update for 12 weeks at assessment (04/16/2022)  Pt will be independent w/ finalized strength and balance and cervical mobility HEP to further improve mobility and pain. Baseline:  To be updated. Goal status: INITIAL  2.  Pt will improve FGA score to >/=25/30 in order to demonstrate improved balance and decreased fall risk. Baseline: 20/30 Goal status: INITIAL  3.  Pt will reports </=5 days of migraines per month to demonstrate subjective improvement in function. Baseline: reports 8-10 migraines per month Goal status: INITIAL  4.  Pt will improve NDI to score to no more than 18/50 to demonstrate subjective improvement in functioning. Baseline: 26/50 Goal status: INITIAL  5.  Pt will ambulate up and down 16 stairs modI level w/ reciprocal or step-to gait  without LOB to improve safe mobility in home environment. Baseline: Pt can go reciprocally w/ right rail, but has lost balance at home several times resulting in a previous fall. Goal status: INITIAL  ASSESSMENT:  CLINICAL IMPRESSION: Emphasis of skilled PT session*** Continue POC.  OBJECTIVE IMPAIRMENTS: Abnormal gait, decreased balance, decreased knowledge of use of DME, decreased strength, impaired sensation, impaired tone, impaired UE functional use, improper body mechanics, and postural dysfunction.   ACTIVITY LIMITATIONS: carrying, lifting, stairs, and locomotion level  PARTICIPATION LIMITATIONS: community activity  PERSONAL FACTORS: Age, Fitness, Past/current experiences, Time since onset of injury/illness/exacerbation, and 1-2 comorbidities: anxiety and Fuch's disease  are also affecting patient's functional outcome.   REHAB POTENTIAL: Excellent  CLINICAL DECISION MAKING: Evolving/moderate complexity  EVALUATION COMPLEXITY: Moderate  PLAN:  PT FREQUENCY: 1x/week  PT DURATION: 10 weeks  PLANNED INTERVENTIONS: Therapeutic exercises, Therapeutic activity, Neuromuscular re-education, Balance training, Gait training, Patient/Family education, Self Care, Joint mobilization, Stair training, Vestibular training, Dry Needling, Manual therapy, and Re-evaluation  PLAN FOR NEXT SESSION:  Modify HEP prn-deficits from FGA and cervical mobility/strength/MYF techniques, upper trap/levator/SCM stretches; DN on Dec 18, focus on balance next session, postural exercise, dual tasking***   Excell Seltzer, PT, DPT, CSRS 03/15/2022, 8:53 AM

## 2022-03-19 ENCOUNTER — Ambulatory Visit: Payer: Medicare Other | Admitting: Physical Therapy

## 2022-03-19 DIAGNOSIS — H16203 Unspecified keratoconjunctivitis, bilateral: Secondary | ICD-10-CM | POA: Diagnosis not present

## 2022-03-24 ENCOUNTER — Ambulatory Visit: Payer: Medicare Other | Admitting: Physical Therapy

## 2022-03-24 ENCOUNTER — Encounter: Payer: Self-pay | Admitting: Physical Therapy

## 2022-03-24 DIAGNOSIS — M6281 Muscle weakness (generalized): Secondary | ICD-10-CM | POA: Diagnosis not present

## 2022-03-24 DIAGNOSIS — R2681 Unsteadiness on feet: Secondary | ICD-10-CM

## 2022-03-24 DIAGNOSIS — R2689 Other abnormalities of gait and mobility: Secondary | ICD-10-CM

## 2022-03-24 DIAGNOSIS — I639 Cerebral infarction, unspecified: Secondary | ICD-10-CM

## 2022-03-24 DIAGNOSIS — M542 Cervicalgia: Secondary | ICD-10-CM

## 2022-03-24 DIAGNOSIS — R296 Repeated falls: Secondary | ICD-10-CM

## 2022-03-24 NOTE — Therapy (Signed)
OUTPATIENT PHYSICAL THERAPY NEURO TREATMENT   Patient Name: Ruth Gregory MRN: [id number] DOB:[birth year], 61 y.o., female Today's Date: 03/24/2022   PCP: Unk Pinto, MD REFERRING PROVIDER: Marcial Pacas, MD   PT End of Session - 03/24/22 1320     Visit Number 6    Number of Visits 11   10+eval   Date for PT Re-Evaluation 04/16/22    Authorization Type [employer] MEDICARE    Progress Note Due on Visit 10    PT Start Time 1318   10 mins not billed for d/t scheduling   PT Stop Time 1359    PT Time Calculation (min) 41 min    Activity Tolerance Patient tolerated treatment well    Behavior During Therapy WFL for tasks assessed/performed             Past Medical History:  Diagnosis Date   Anemia    Anxiety    Asthma    Chronic heartburn    Depression    Elevated cholesterol    on Crestor   Headache(784.0)    Hypertension    Hypothyroidism    S/P thyroidectomy   Migraine    Occlusion and stenosis of carotid artery with cerebral infarction    Spasm of muscle    Unspecified cerebral artery occlusion with cerebral infarction    Vitamin D deficiency    Past Surgical History:  Procedure Laterality Date   AUGMENTATION MAMMAPLASTY Bilateral 2000   COSMETIC SURGERY     CYSTOCELE REPAIR N/A 01/08/2013   Procedure: ANTERIOR REPAIR (CYSTOCELE);  Surgeon: Margarette Asal, MD;  Location: [facility];  Service: Gynecology;  Laterality: N/A;   NASAL SINUS SURGERY     VAGINAL HYSTERECTOMY     Patient Active Problem List   Diagnosis Date Noted   Cerebrovascular accident (CVA) ([place]) 02/02/2022   Muscle weakness (generalized) 02/02/2022   Gait abnormality 01/13/2022   Closed displaced fracture of proximal phalanx of lesser toe, initial encounter 10/21/2021   Squamous cell carcinoma in situ (SCCIS) of dorsum of left hand 09/11/2021   Arthritis of carpometacarpal [facility]) joint of right thumb 08/11/2021   Degenerative arthritis of distal interphalangeal joint of index finger of right  hand 08/11/2021   Degenerative arthritis of distal interphalangeal joint of middle finger of right hand 08/11/2021   Degenerative arthritis of distal interphalangeal joint of ring finger of right hand 08/11/2021   Partial symptomatic epilepsy with complex partial seizures, not intractable, without status epilepticus ([place]) 01/21/2021   B12 deficiency 09/18/2020   Chronic migraine w/o aura w/o status migrainosus, not intractable 04/15/2020   Spastic hemiplegia of left nondominant side as late effect of cerebrovascular disease ([place]) 06/18/2019   Abnormal glucose 07/27/2018   Labile hypertension 07/27/2018   Fatty liver 10/09/2017   GERD (gastroesophageal reflux disease) 10/07/2017   Anxiety 10/07/2017   Hypothyroidism 07/23/2015   Vitamin D deficiency 10/03/2013   Medication management 10/03/2013   History of prediabetes 10/03/2013   ADD (attention deficit disorder) without hyperactivity 10/03/2013   Hyperlipidemia, mixed 04/04/2013   Sinusitis, chronic 04/04/2013   Migraine 09/06/2012   Spastic hemiplegia affecting left nondominant side ([place]) 09/06/2012   History of CVA (cerebrovascular accident)     ONSET DATE: 01/13/2022 (referral)  REFERRING DIAG: G81.157 (ICD-10-CM) - Spastic hemiplegia of left nondominant side as late effect of cerebrovascular disease, unspecified cerebrovascular disease type ([place]) G43.709 (ICD-10-CM) - Chronic migraine w/o aura w/o status migrainosus, not intractable G40.209 (ICD-10-CM) - Partial symptomatic epilepsy with complex partial seizures, not intractable,  without status epilepticus ([place]) M54.2 (ICD-10-CM) - Neck pain  THERAPY DIAG:  Muscle weakness (generalized)  Unsteadiness on feet  Other abnormalities of gait and mobility  Repeated falls  Cervicalgia  Cerebrovascular accident (CVA), unspecified mechanism ([place])  Rationale for Evaluation and Treatment: Rehabilitation  SUBJECTIVE:                                                                                                                                                                                              SUBJECTIVE STATEMENT: Pt states she had a migraine yesterday, but that has resolved today.  She states she had a single fall since last visit.  She was approaching a medium of grass coming off the road looking into a store window.  When she got to the edge of the curb and got ready to step off she went right off instead of intentionally stepping down.  She fell forward onto right hand and left hip, but this resolved. 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  Compliant to med schedule to manage this and has returned to driving.  Botox to left hemibody 10/18.   PAIN:  Are you having pain? No  PRECAUTIONS: Cervical and Fall-pt has stent in right internal carotid  WEIGHT BEARING RESTRICTIONS: No  FALLS: Has patient fallen in last 6 months? Yes. Number of falls 2-tripped over laundry basket and does not recall the second  LIVING ENVIRONMENT: Lives with: lives with their spouse Lives in: House/apartment Stairs: Yes: Internal: 16 steps; on right going up and External: 3 steps; none Has following equipment at home: Grab bars and raised commode seat and removable show head  PLOF: Independent  PATIENT GOALS: "To feel more comfortable about not falling."  OBJECTIVE:   TODAY'S TREATMENT:  DATE:  Time spent rescheduling missed DN appt. -Blaze pods using distracting colors for increased dual task component during side stepping, 4 rounds x75mn each w/ 30  sec rest between each one, pt has single LOB w/ large crossover step > varying heights 0-8" w/ varying proximity b/w pods to increase physical demand, best score 27 hits, no incorrect hits -Firm BOSU in the // bars w/ BUE support:  lateral and anterior to posterior weight shifts > clockwise and counterclockwise circles > min squats x10  PATIENT EDUCATION: Education details: Continue HEP, plan to discharge in coming visits.  Schedule for DN. Person educated: Patient Education method: Explanation Education comprehension: verbalized understanding  HOME EXERCISE PROGRAM: Access Code: [email or web address]: [email or web address] Date: 02/08/2022 Prepared by: MElease Etienne Exercises - Backward Tandem Walking with Counter Support  - 1 x daily - 5 x weekly - 3 sets - 10 reps - Romberg Stance Eyes Closed on Foam Pad  - 1 x daily - 5 x weekly - 1 sets - 3 reps - 30 seconds hold - Supine Chin Tuck  - 1 x daily - 5 x weekly - 2 sets - 12 reps - Supine Cervical Retraction with Towel  - 1 x daily - 5 x weekly - 2 sets - 10 reps - Supine Suboccipital Release with Tennis Balls  - 1 x daily - 7 x weekly - 3 sets - 10 reps -pt has not purchased tennis balls, but uses fingertips  Chair yoga handout-wrote frequency and highlighted positions reviewed 02/22/2022.  GOALS: Goals reviewed with patient? Yes  SHORT TERM GOALS: Target date: 03/05/2022  Pt will be independent with initial strength and balance HEP to maintain gains outside the clinic. Baseline:  To be established.  Reviewed in part on 12/11, pt compliant to updates at home. Goal status: MET  2.  Pt will improve NDI score to no more than 22/50 to indicate subjective improvement in functioning. Baseline: 26/50; 29/50 (12/11) Goal status: NOT MET  3.  Pt will be able to verbalize fall prevention strategies and will demonstrate independence with floor recovery. Baseline: Pt independent w/ fall recovery and strategies. Goal status:  MET  3.  Pt will maintain chin tuck >/=13 sec during deep neck flexor endurance test to demonstrate improved muscular endurance for supporting cervical musculature. Baseline: 4.94 sec 11/13; 12.47 sec (12/11) Goal status: IN PROGRESS  LONG TERM GOALS: Target date: 04/02/2021 (8 weeks); update for 12 weeks at assessment (04/16/2022)  Pt will be independent w/ finalized strength and balance and cervical mobility HEP to further improve mobility and pain. Baseline:  To be updated. Goal status: INITIAL  2.  Pt will improve FGA score to >/=25/30 in order to demonstrate improved balance and decreased fall risk. Baseline: 20/30 Goal status: INITIAL  3.  Pt will reports </=5 days of migraines per month to demonstrate subjective improvement in function. Baseline: reports 8-10 migraines per month Goal status: INITIAL  4.  Pt will improve NDI to score to no more than 18/50 to demonstrate subjective improvement in functioning. Baseline: 26/50 Goal status: INITIAL  5.  Pt will ambulate up and down 16 stairs modI level w/ reciprocal or step-to gait without LOB to improve safe mobility in home environment. Baseline: Pt can go reciprocally w/ right rail, but has lost balance at home several times resulting in a previous fall. Goal status: INITIAL  ASSESSMENT:  CLINICAL IMPRESSION: Emphasis of skilled session today on dynamic balance w/ dual task component using blaze pods in distraction  mode.  Initiated session with PT making adjustments to pt schedule to accommodate prior missed visit for dry needling, not billable time.  Will continue to address HEP modifications with further balance and cervical exercises.  OBJECTIVE IMPAIRMENTS: Abnormal gait, decreased balance, decreased knowledge of use of DME, decreased strength, impaired sensation, impaired tone, impaired UE functional use, improper body mechanics, and postural dysfunction.   ACTIVITY LIMITATIONS: carrying, lifting, stairs, and locomotion  level  PARTICIPATION LIMITATIONS: community activity  PERSONAL FACTORS: Age, Fitness, Past/current experiences, Time since onset of injury/illness/exacerbation, and 1-2 comorbidities: anxiety and Fuch's disease  are also affecting patient's functional outcome.   REHAB POTENTIAL: Excellent  CLINICAL DECISION MAKING: Evolving/moderate complexity  EVALUATION COMPLEXITY: Moderate  PLAN:  PT FREQUENCY: 1x/week  PT DURATION: 10 weeks  PLANNED INTERVENTIONS: Therapeutic exercises, Therapeutic activity, Neuromuscular re-education, Balance training, Gait training, Patient/Family education, Self Care, Joint mobilization, Stair training, Vestibular training, Dry Needling, Manual therapy, and Re-evaluation  PLAN FOR NEXT SESSION:  Modify HEP prn-deficits from FGA and cervical mobility/strength/MYF techniques, upper trap/levator/SCM stretches-add to HEP!; balance on BOSU no UE, postural exercise, dual tasking, DN Jan 8.   Bary Richard, PT, DPT 03/24/2022, 4:47 PM

## 2022-03-25 ENCOUNTER — Other Ambulatory Visit: Payer: Self-pay | Admitting: Nurse Practitioner

## 2022-03-25 DIAGNOSIS — F419 Anxiety disorder, unspecified: Secondary | ICD-10-CM

## 2022-03-25 MED ORDER — ALPRAZOLAM 0.5 MG PO TABS: ORAL_TABLET | ORAL | 0 refills | Status: DC

## 2022-03-31 ENCOUNTER — Ambulatory Visit: Payer: Medicare Other | Attending: Neurology | Admitting: Physical Therapy

## 2022-03-31 ENCOUNTER — Encounter: Payer: Self-pay | Admitting: Physical Therapy

## 2022-03-31 DIAGNOSIS — M6281 Muscle weakness (generalized): Secondary | ICD-10-CM | POA: Diagnosis not present

## 2022-03-31 DIAGNOSIS — R296 Repeated falls: Secondary | ICD-10-CM

## 2022-03-31 DIAGNOSIS — R2689 Other abnormalities of gait and mobility: Secondary | ICD-10-CM | POA: Diagnosis not present

## 2022-03-31 DIAGNOSIS — R2681 Unsteadiness on feet: Secondary | ICD-10-CM

## 2022-03-31 DIAGNOSIS — M542 Cervicalgia: Secondary | ICD-10-CM

## 2022-03-31 NOTE — Patient Instructions (Signed)
Access Code: [email or web address] URL: [email or web address] Date: 03/31/2022 Prepared by: Elease Etienne  Exercises - Backward Tandem Walking with Counter Support  - 1 x daily - 5 x weekly - 3 sets - 10 reps - Romberg Stance Eyes Closed on Foam Pad  - 1 x daily - 5 x weekly - 1 sets - 3 reps - 30 seconds hold - Supine Chin Tuck  - 1 x daily - 5 x weekly - 2 sets - 12 reps - Supine Cervical Retraction with Towel  - 1 x daily - 5 x weekly - 2 sets - 10 reps - Supine Suboccipital Release with Tennis Balls  - 1 x daily - 7 x weekly - 3 sets - 10 reps - Seated Upper Trapezius Stretch  - 1 x daily - 5 x weekly - 1 sets - 3 reps - 45 seconds hold - Seated Levator Scapulae Stretch  - 1 x daily - 5 x weekly - 1 sets - 3 reps - 45 seconds hold - Sternocleidomastoid Stretch  - 1 x daily - 5 x weekly - 1 sets - 3 reps - 45 seconds hold

## 2022-03-31 NOTE — Therapy (Signed)
OUTPATIENT PHYSICAL THERAPY NEURO TREATMENT   Patient Name: Ruth Gregory MRN: [id number] DOB:28-Sep-1960, 62 y.o., female Today's Date: 03/31/2022   PCP: Unk Pinto, MD REFERRING PROVIDER: Marcial Pacas, MD   PT End of Session - 03/31/22 1321     Visit Number 7    Number of Visits 11   10+eval   Date for PT Re-Evaluation 04/16/22    Authorization Type [employer] MEDICARE    Progress Note Due on Visit 10    PT Start Time 1316    PT Stop Time 1355    PT Time Calculation (min) 39 min    Activity Tolerance Patient tolerated treatment well    Behavior During Therapy [facility] for tasks assessed/performed;Anxious   pt is dealing with ongoing situation w/ dog reporting nervousness            Past Medical History:  Diagnosis Date   Anemia    Anxiety    Asthma    Chronic heartburn    Depression    Elevated cholesterol    on Crestor   Headache(784.0)    Hypertension    Hypothyroidism    S/P thyroidectomy   Migraine    Occlusion and stenosis of carotid artery with cerebral infarction    Spasm of muscle    Unspecified cerebral artery occlusion with cerebral infarction    Vitamin D deficiency    Past Surgical History:  Procedure Laterality Date   AUGMENTATION MAMMAPLASTY Bilateral 2000   COSMETIC SURGERY     CYSTOCELE REPAIR N/A 01/08/2013   Procedure: ANTERIOR REPAIR (CYSTOCELE);  Surgeon: Margarette Asal, MD;  Location: [facility];  Service: Gynecology;  Laterality: N/A;   NASAL SINUS SURGERY     VAGINAL HYSTERECTOMY     Patient Active Problem List   Diagnosis Date Noted   Cerebrovascular accident (CVA) ([place]) 02/02/2022   Muscle weakness (generalized) 02/02/2022   Gait abnormality 01/13/2022   Closed displaced fracture of proximal phalanx of lesser toe, initial encounter 10/21/2021   Squamous cell carcinoma in situ (SCCIS) of dorsum of left hand 09/11/2021   Arthritis of carpometacarpal [facility]) joint of right thumb 08/11/2021   Degenerative arthritis of distal  interphalangeal joint of index finger of right hand 08/11/2021   Degenerative arthritis of distal interphalangeal joint of middle finger of right hand 08/11/2021   Degenerative arthritis of distal interphalangeal joint of ring finger of right hand 08/11/2021   Partial symptomatic epilepsy with complex partial seizures, not intractable, without status epilepticus ([place]) 01/21/2021   B12 deficiency 09/18/2020   Chronic migraine w/o aura w/o status migrainosus, not intractable 04/15/2020   Spastic hemiplegia of left nondominant side as late effect of cerebrovascular disease ([place]) 06/18/2019   Abnormal glucose 07/27/2018   Labile hypertension 07/27/2018   Fatty liver 10/09/2017   GERD (gastroesophageal reflux disease) 10/07/2017   Anxiety 10/07/2017   Hypothyroidism 07/23/2015   Vitamin D deficiency 10/03/2013   Medication management 10/03/2013   History of prediabetes 10/03/2013   ADD (attention deficit disorder) without hyperactivity 10/03/2013   Hyperlipidemia, mixed 04/04/2013   Sinusitis, chronic 04/04/2013   Migraine 09/06/2012   Spastic hemiplegia affecting left nondominant side ([place]) 09/06/2012   History of CVA (cerebrovascular accident)     ONSET DATE: 01/13/2022 (referral)  REFERRING DIAG: O17.510 (ICD-10-CM) - Spastic hemiplegia of left nondominant side as late effect of cerebrovascular disease, unspecified cerebrovascular disease type ([place]) G43.709 (ICD-10-CM) - Chronic migraine w/o aura w/o status migrainosus, not intractable G40.209 (ICD-10-CM) - Partial symptomatic epilepsy with complex partial  seizures, not intractable, without status epilepticus ([place]) M54.2 (ICD-10-CM) - Neck pain  THERAPY DIAG:  Muscle weakness (generalized)  Unsteadiness on feet  Other abnormalities of gait and mobility  Repeated falls  Cervicalgia  Rationale for Evaluation and Treatment: Rehabilitation  SUBJECTIVE:                                                                                                                                                                                              SUBJECTIVE STATEMENT: Pt states her head hurts, but not a migraine.  She denies any falls since last visit and all issues from prior fall have resolved.   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  Compliant to med schedule to manage this and has returned to driving.  Botox to left hemibody 10/18.   PAIN:  Are you having pain? No-did not rate headache  PRECAUTIONS: Cervical and Fall-pt has stent in right internal carotid  WEIGHT BEARING RESTRICTIONS: No  FALLS: Has patient fallen in last 6 months? Yes. Number of falls 2-tripped over laundry basket and does not recall the second  LIVING ENVIRONMENT: Lives with: lives with their spouse Lives in: House/apartment Stairs: Yes: Internal: 16 steps; on right going up and External: 3 steps; none Has following equipment at home: Grab bars and raised commode seat and removable show head  PLOF: Independent  PATIENT GOALS: "To feel more comfortable about not falling."  OBJECTIVE:   TODAY'S TREATMENT:                                                                                                                              DATE:  -Upper trap stretch 2x45 sec each side w/o overpressure -Levator stretch 2x45 sec each side w/o overpressure -SCM stretch 2x45 sec each side Encouraged pt to be gentle with stretches particularly the  right side due to stent placement. -Standing on airex normal BOS performing midline cone taps > lateral cone taps > crossbody taps progressing to no UE support no LOB -Standing on airex unilateral over head body  blade w/ feet apart > feet together -Standing on airex bilateral forward body blade w/ feet apart > feet together, pt has left lateral LOB requiring modA to recover -Standing on airex feet together overhead body blade bimanually anteriorly oriented, no LOB -backwards walking with vertical ball toss 2x25', pt reports mild dizziness and worsening headache.  Session concluded.  PATIENT EDUCATION: Education details: Reminder about DN next visit.  Continue HEP w/ additions. Person educated: Patient Education method: Explanation Education comprehension: verbalized understanding  HOME EXERCISE PROGRAM: Access Code: [email or web address] URL: [email or web address] Date: 02/08/2022 Prepared by: Elease Etienne  Exercises - Backward Tandem Walking with Counter Support  - 1 x daily - 5 x weekly - 3 sets - 10 reps - Romberg Stance Eyes Closed on Foam Pad  - 1 x daily - 5 x weekly - 1 sets - 3 reps - 30 seconds hold - Supine Chin Tuck  - 1 x daily - 5 x weekly - 2 sets - 12 reps - Supine Cervical Retraction with Towel  - 1 x daily - 5 x weekly - 2 sets - 10 reps - Supine Suboccipital Release with Tennis Balls  - 1 x daily - 7 x weekly - 3 sets - 10 reps -pt has not purchased tennis balls, but uses fingertips - Seated Upper Trapezius Stretch  - 1 x daily - 5 x weekly - 1 sets - 3 reps - 45 seconds hold - Seated Levator Scapulae Stretch  - 1 x daily - 5 x weekly - 1 sets - 3 reps - 45 seconds hold - Sternocleidomastoid Stretch  - 1 x daily - 5 x weekly - 1 sets - 3 reps - 45 seconds hold Chair yoga handout-wrote frequency and highlighted positions reviewed 02/22/2022.  GOALS: Goals reviewed with patient? Yes  SHORT TERM GOALS: Target date: 03/05/2022  Pt will be independent with initial strength and balance HEP to maintain gains outside the clinic. Baseline:  To be established.  Reviewed in part on 12/11, pt compliant to updates at home. Goal status: MET  2.  Pt will improve NDI score to no  more than 22/50 to indicate subjective improvement in functioning. Baseline: 26/50; 29/50 (12/11) Goal status: NOT MET  3.  Pt will be able to verbalize fall prevention strategies and will demonstrate independence with floor recovery. Baseline: Pt independent w/ fall recovery and strategies. Goal status: MET  3.  Pt will maintain chin tuck >/=13 sec during deep neck flexor endurance test to demonstrate improved muscular endurance for supporting cervical musculature. Baseline: 4.94 sec 11/13; 12.47 sec (12/11) Goal status: IN PROGRESS  LONG TERM GOALS: Target date: 04/02/2021 (8 weeks); update for 12 weeks at assessment (04/16/2022)  Pt will be independent w/ finalized strength and balance and cervical mobility HEP to further improve mobility and pain. Baseline:  To be updated. Goal status: INITIAL  2.  Pt will improve FGA score to >/=25/30 in order to demonstrate improved balance and decreased fall risk. Baseline: 20/30 Goal status: INITIAL  3.  Pt will reports </=5 days of migraines per month to demonstrate subjective improvement in function. Baseline: reports 8-10 migraines per month Goal status: INITIAL  4.  Pt will improve NDI to score to no more than 18/50 to demonstrate subjective improvement in functioning. Baseline: 26/50  Goal status: INITIAL  5.  Pt will ambulate up and down 16 stairs modI level w/ reciprocal or step-to gait without LOB to improve safe mobility in home environment. Baseline: Pt can go reciprocally w/ right rail, but has lost balance at home several times resulting in a previous fall. Goal status: INITIAL  ASSESSMENT:  CLINICAL IMPRESSION: Made addition to HEP for further migraine and neck pain management.  Pt has good response to stretching this session.  She has self-initiated using Youtube videos to supplement ongoing chair yoga and is returning to normal activities.  Further address dynamic balance with manual dual task component today with patient  performing steadily.  Pt agreeable to dry needling next session and will continue to address deficits in ongoing skilled PT POC prior to planned discharge in coming visits.  OBJECTIVE IMPAIRMENTS: Abnormal gait, decreased balance, decreased knowledge of use of DME, decreased strength, impaired sensation, impaired tone, impaired UE functional use, improper body mechanics, and postural dysfunction.   ACTIVITY LIMITATIONS: carrying, lifting, stairs, and locomotion level  PARTICIPATION LIMITATIONS: community activity  PERSONAL FACTORS: Age, Fitness, Past/current experiences, Time since onset of injury/illness/exacerbation, and 1-2 comorbidities: anxiety and Fuch's disease  are also affecting patient's functional outcome.   REHAB POTENTIAL: Excellent  CLINICAL DECISION MAKING: Evolving/moderate complexity  EVALUATION COMPLEXITY: Moderate  PLAN:  PT FREQUENCY: 1x/week  PT DURATION: 10 weeks  PLANNED INTERVENTIONS: Therapeutic exercises, Therapeutic activity, Neuromuscular re-education, Balance training, Gait training, Patient/Family education, Self Care, Joint mobilization, Stair training, Vestibular training, Dry Needling, Manual therapy, and Re-evaluation  PLAN FOR NEXT SESSION:  Modify HEP prn, balance on BOSU no UE, postural exercise, dual tasking, DN Jan 8.   Bary Richard, PT, DPT 03/31/2022, 1:59 PM

## 2022-04-05 ENCOUNTER — Encounter: Payer: Self-pay | Admitting: Physical Therapy

## 2022-04-05 ENCOUNTER — Ambulatory Visit: Payer: Medicare Other | Admitting: Physical Therapy

## 2022-04-05 DIAGNOSIS — R2681 Unsteadiness on feet: Secondary | ICD-10-CM

## 2022-04-05 DIAGNOSIS — M6281 Muscle weakness (generalized): Secondary | ICD-10-CM

## 2022-04-05 DIAGNOSIS — R2689 Other abnormalities of gait and mobility: Secondary | ICD-10-CM

## 2022-04-05 DIAGNOSIS — R296 Repeated falls: Secondary | ICD-10-CM

## 2022-04-05 DIAGNOSIS — M542 Cervicalgia: Secondary | ICD-10-CM

## 2022-04-05 NOTE — Therapy (Addendum)
OUTPATIENT PHYSICAL THERAPY NEURO TREATMENT   Patient Name: Ruth Gregory MRN: [id number] DOB:[birth year], 62 y.o., female Today's Date: 04/05/2022   PCP: Lucky Cowboy, MD REFERRING PROVIDER: Levert Feinstein, MD   PT End of Session - 04/05/22 1415     Visit Number 8    Number of Visits 11   10+eval   Date for PT Re-Evaluation 04/16/22    Authorization Type [employer] MEDICARE    Progress Note Due on Visit 10    PT Start Time 1408    PT Stop Time 1449    PT Time Calculation (min) 41 min    Activity Tolerance Patient tolerated treatment well    Behavior During Therapy WFL for tasks assessed/performed             Past Medical History:  Diagnosis Date   Anemia    Anxiety    Asthma    Chronic heartburn    Depression    Elevated cholesterol    on Crestor   Headache(784.0)    Hypertension    Hypothyroidism    S/P thyroidectomy   Migraine    Occlusion and stenosis of carotid artery with cerebral infarction    Spasm of muscle    Unspecified cerebral artery occlusion with cerebral infarction    Vitamin D deficiency    Past Surgical History:  Procedure Laterality Date   AUGMENTATION MAMMAPLASTY Bilateral 2000   COSMETIC SURGERY     CYSTOCELE REPAIR N/A 01/08/2013   Procedure: ANTERIOR REPAIR (CYSTOCELE);  Surgeon: Meriel Pica, MD;  Location: [facility];  Service: Gynecology;  Laterality: N/A;   NASAL SINUS SURGERY     VAGINAL HYSTERECTOMY     Patient Active Problem List   Diagnosis Date Noted   Cerebrovascular accident (CVA) ([facility]) 02/02/2022   Muscle weakness (generalized) 02/02/2022   Gait abnormality 01/13/2022   Closed displaced fracture of proximal phalanx of lesser toe, initial encounter 10/21/2021   Squamous cell carcinoma in situ (SCCIS) of dorsum of left hand 09/11/2021   Arthritis of carpometacarpal [facility]) joint of right thumb 08/11/2021   Degenerative arthritis of distal interphalangeal joint of index finger of right hand 08/11/2021   Degenerative  arthritis of distal interphalangeal joint of middle finger of right hand 08/11/2021   Degenerative arthritis of distal interphalangeal joint of ring finger of right hand 08/11/2021   Partial symptomatic epilepsy with complex partial seizures, not intractable, without status epilepticus ([facility]) 01/21/2021   B12 deficiency 09/18/2020   Chronic migraine w/o aura w/o status migrainosus, not intractable 04/15/2020   Spastic hemiplegia of left nondominant side as late effect of cerebrovascular disease ([facility]) 06/18/2019   Abnormal glucose 07/27/2018   Labile hypertension 07/27/2018   Fatty liver 10/09/2017   GERD (gastroesophageal reflux disease) 10/07/2017   Anxiety 10/07/2017   Hypothyroidism 07/23/2015   Vitamin D deficiency 10/03/2013   Medication management 10/03/2013   History of prediabetes 10/03/2013   ADD (attention deficit disorder) without hyperactivity 10/03/2013   Hyperlipidemia, mixed 04/04/2013   Sinusitis, chronic 04/04/2013   Migraine 09/06/2012   Spastic hemiplegia affecting left nondominant side ([facility]) 09/06/2012   History of CVA (cerebrovascular accident)     ONSET DATE: 01/13/2022 (referral)  REFERRING DIAG: Z36.644 (ICD-10-CM) - Spastic hemiplegia of left nondominant side as late effect of cerebrovascular disease, unspecified cerebrovascular disease type ([facility]) G43.709 (ICD-10-CM) - Chronic migraine w/o aura w/o status migrainosus, not intractable G40.209 (ICD-10-CM) - Partial symptomatic epilepsy with complex partial seizures, not intractable, without status epilepticus ([facility]) M54.2 (ICD-10-CM) - Neck  pain  THERAPY DIAG:  Muscle weakness (generalized)  Unsteadiness on feet  Other abnormalities of gait and mobility  Repeated falls  Cervicalgia  Rationale for Evaluation and Treatment: Rehabilitation  SUBJECTIVE:                                                                                                                                                                                              SUBJECTIVE STATEMENT: Pt states her eyes have started giving her a problem again as she has been finishing her steroid drops, but she has contacted her Ophthalmologist about this.  She denies falls or headache today. 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  Compliant to med schedule to manage this and has returned to driving.  Botox to left hemibody 10/18.   PAIN:  Are you having pain? No  PRECAUTIONS: Cervical and Fall-pt has stent in right internal carotid  WEIGHT BEARING RESTRICTIONS: No  FALLS: Has patient fallen in last 6 months? Yes. Number of falls 2-tripped over laundry basket and does not recall the second  LIVING ENVIRONMENT: Lives with: lives with their spouse Lives in: House/apartment Stairs: Yes: Internal: 16 steps; on right going up and External: 3 steps; none Has following equipment at home: Grab bars and raised commode seat and removable show head  PLOF: Independent  PATIENT GOALS: "To feel more comfortable about not falling."  OBJECTIVE:   TODAY'S TREATMENT:                                                                                                                              -Soft BOSU step ups w/ contralateral hip drive x 20 w/ BUE support > x20 unilateral UE support > 2 fingertip support x20 alt LE -Hard BOSU squats 2x8 progressing to no UE support w/ therapist CGA at lateral hip >  5# kettlebell squats x8 -Backwards walking counting down from 100 by 3 x25', pt decreases speed to subtract w/o errors > naming animals x25' no errors or decreased speed > pt stating foods beginning w/ each letter of the alphabet w/ backwards  ambulation 4x25' w/ 3 errors requiring PT to provide hint then answer, pt ambulates w/ decreased speed  Trigger Point Dry-Needling  Treatment instructions: Expect mild to moderate muscle soreness. S/S of pneumothorax if dry needled over a lung field, and to seek immediate medical attention should they occur. Patient verbalized understanding of these instructions and education.  Patient Consent Given: Yes Education handout provided: Yes Muscles treated: L and R suboccipitals Treatment response/outcome: decreased palpable tightness; deep ache/muscle twitch  Trigger Point Dry Needling  What is Trigger Point Dry Needling (DN)? DN is a physical therapy technique used to treat muscle pain and dysfunction. Specifically, DN helps deactivate muscle trigger points (muscle knots).  A thin filiform needle is used to penetrate the skin and stimulate the underlying trigger point. The goal is for a local twitch response (LTR) to occur and for the trigger point to relax. No medication of any kind is injected during the procedure.   What Does Trigger Point Dry Needling Feel Like?  The procedure feels different for each individual patient. Some patients report that they do not actually feel the needle enter the skin and overall the process is not painful. Very mild bleeding may occur. However, many patients feel a deep cramping in the muscle in which the needle was inserted. This is the local twitch response.   How Will I feel after the treatment? Soreness is normal, and the onset of soreness may not occur for a few hours. Typically this soreness does not last longer than two days.  Bruising is uncommon, however; ice can be used to decrease any possible bruising.  In rare cases feeling tired or nauseous after the treatment is normal. In addition, your symptoms may get worse before they get better, this period will typically not last longer than 24 hours.   What Can I do After My Treatment? Increase your  hydration by drinking more water for the next 24 hours. You may place ice or heat on the areas treated that have become sore, however, do not use heat on inflamed or bruised areas. Heat often brings more relief post needling. You can continue your regular activities, but vigorous activity is not recommended initially after the treatment for 24 hours. DN is best combined with other physical therapy such as strengthening, stretching, and other therapies.     PATIENT EDUCATION: Education details: Continue HEP.  Plan for discharge next visit., TPDN Person educated: Patient Education method: Explanation and Handouts Education comprehension: verbalized understanding  HOME EXERCISE PROGRAM: Access Code: [email or web address] URL: [email or web address] Date: 02/08/2022 Prepared by: Elease Etienne  Exercises - Backward Tandem Walking with Counter Support  - 1 x daily - 5 x weekly - 3 sets - 10 reps - Romberg Stance Eyes Closed on Foam Pad  - 1 x daily - 5 x weekly - 1 sets - 3 reps - 30 seconds hold - Supine Chin Tuck  - 1 x daily - 5 x weekly - 2 sets - 12 reps - Supine Cervical Retraction with Towel  - 1 x daily - 5 x weekly - 2 sets - 10 reps - Supine Suboccipital Release with Tennis Balls  - 1 x daily - 7 x weekly - 3 sets - 10 reps -pt has not purchased  tennis balls, but uses fingertips - Seated Upper Trapezius Stretch  - 1 x daily - 5 x weekly - 1 sets - 3 reps - 45 seconds hold - Seated Levator Scapulae Stretch  - 1 x daily - 5 x weekly - 1 sets - 3 reps - 45 seconds hold - Sternocleidomastoid Stretch  - 1 x daily - 5 x weekly - 1 sets - 3 reps - 45 seconds hold Chair yoga handout-wrote frequency and highlighted positions reviewed 02/22/2022.  GOALS: Goals reviewed with patient? Yes  SHORT TERM GOALS: Target date: 03/05/2022  Pt will be independent with initial strength and balance HEP to maintain gains outside the clinic. Baseline:  To be established.  Reviewed in part on 12/11,  pt compliant to updates at home. Goal status: MET  2.  Pt will improve NDI score to no more than 22/50 to indicate subjective improvement in functioning. Baseline: 26/50; 29/50 (12/11) Goal status: NOT MET  3.  Pt will be able to verbalize fall prevention strategies and will demonstrate independence with floor recovery. Baseline: Pt independent w/ fall recovery and strategies. Goal status: MET  3.  Pt will maintain chin tuck >/=13 sec during deep neck flexor endurance test to demonstrate improved muscular endurance for supporting cervical musculature. Baseline: 4.94 sec 11/13; 12.47 sec (12/11) Goal status: IN PROGRESS  LONG TERM GOALS: Target date: 04/02/2021 (8 weeks); update for 12 weeks at assessment (04/16/2022)  Pt will be independent w/ finalized strength and balance and cervical mobility HEP to further improve mobility and pain. Baseline:  To be updated. Goal status: INITIAL  2.  Pt will improve FGA score to >/=25/30 in order to demonstrate improved balance and decreased fall risk. Baseline: 20/30 Goal status: INITIAL  3.  Pt will reports </=5 days of migraines per month to demonstrate subjective improvement in function. Baseline: reports 8-10 migraines per month Goal status: INITIAL  4.  Pt will improve NDI to score to no more than 18/50 to demonstrate subjective improvement in functioning. Baseline: 26/50 Goal status: INITIAL  5.  Pt will ambulate up and down 16 stairs modI level w/ reciprocal or step-to gait without LOB to improve safe mobility in home environment. Baseline: Pt can go reciprocally w/ right rail, but has lost balance at home several times resulting in a previous fall. Goal status: INITIAL  ASSESSMENT:  CLINICAL IMPRESSION: Session somewhat limited by pt arriving late.  Continuing to work on high level balance challenges as well and dual tasking which remains difficult for patient.  Second PT performs dry needling this visit as pt has had prior positive  response to intervention.  Will plan to assess LTGs and discharge to home management next visit.  OBJECTIVE IMPAIRMENTS: Abnormal gait, decreased balance, decreased knowledge of use of DME, decreased strength, impaired sensation, impaired tone, impaired UE functional use, improper body mechanics, and postural dysfunction.   ACTIVITY LIMITATIONS: carrying, lifting, stairs, and locomotion level  PARTICIPATION LIMITATIONS: community activity  PERSONAL FACTORS: Age, Fitness, Past/current experiences, Time since onset of injury/illness/exacerbation, and 1-2 comorbidities: anxiety and Fuch's disease  are also affecting patient's functional outcome.   REHAB POTENTIAL: Excellent  CLINICAL DECISION MAKING: Evolving/moderate complexity  EVALUATION COMPLEXITY: Moderate  PLAN:  PT FREQUENCY: 1x/week  PT DURATION: 10 weeks  PLANNED INTERVENTIONS: Therapeutic exercises, Therapeutic activity, Neuromuscular re-education, Balance training, Gait training, Patient/Family education, Self Care, Joint mobilization, Stair training, Vestibular training, Dry Needling, Manual therapy, and Re-evaluation  PLAN FOR NEXT SESSION:  ASSESS LTGs-D/C!   Lind Ausley B  Midge Minium, PT, DPT Peter Congo, PT, DPT, CSRS  04/05/2022, 2:49 PM

## 2022-04-06 ENCOUNTER — Other Ambulatory Visit: Payer: Self-pay | Admitting: Internal Medicine

## 2022-04-07 DIAGNOSIS — H04123 Dry eye syndrome of bilateral lacrimal glands: Secondary | ICD-10-CM | POA: Diagnosis not present

## 2022-04-07 DIAGNOSIS — H2513 Age-related nuclear cataract, bilateral: Secondary | ICD-10-CM | POA: Diagnosis not present

## 2022-04-07 DIAGNOSIS — H5202 Hypermetropia, left eye: Secondary | ICD-10-CM | POA: Diagnosis not present

## 2022-04-08 ENCOUNTER — Ambulatory Visit: Payer: Medicare Other | Admitting: Neurology

## 2022-04-08 ENCOUNTER — Encounter: Payer: Self-pay | Admitting: Neurology

## 2022-04-08 VITALS — BP 132/85 | HR 102 | Ht 69.0 in | Wt 159.0 lb

## 2022-04-08 DIAGNOSIS — Z8673 Personal history of transient ischemic attack (TIA), and cerebral infarction without residual deficits: Secondary | ICD-10-CM

## 2022-04-08 DIAGNOSIS — I69954 Hemiplegia and hemiparesis following unspecified cerebrovascular disease affecting left non-dominant side: Secondary | ICD-10-CM

## 2022-04-08 DIAGNOSIS — G40209 Localization-related (focal) (partial) symptomatic epilepsy and epileptic syndromes with complex partial seizures, not intractable, without status epilepticus: Secondary | ICD-10-CM

## 2022-04-08 DIAGNOSIS — G43709 Chronic migraine without aura, not intractable, without status migrainosus: Secondary | ICD-10-CM

## 2022-04-08 MED ORDER — ONABOTULINUMTOXINA 100 UNITS IJ SOLR: 100.0000 [IU] | Freq: Once | INTRAMUSCULAR | Status: DC

## 2022-04-08 MED ORDER — ONABOTULINUMTOXINA 100 UNITS IJ SOLR
100.0000 [IU] | Freq: Once | INTRAMUSCULAR | Status: AC
  Administered 2022-04-08: 100 [IU] via INTRAMUSCULAR

## 2022-04-08 NOTE — Progress Notes (Signed)
Botox 300 units x 3 vials [phone] Exp-06/2024 B/B

## 2022-04-08 NOTE — Progress Notes (Signed)
ASSESSMENT AND PLAN  Ruth Gregory is a 62 y.o. female   Chronic migraine headaches  Increased migraine headache recently  Ubrevyl 100 mg as needed works well  Add on [employer]  History of right internal carotid artery dissection from severe vomiting related to migraine headache, right MCA stroke in June 2009  Probable complex partial seizure EEG in November 2022 showed right frontotemporal focal slowing, Reported most recent episode was on January 18, 2021, no driving until seizure-free for 6 months She did not have similar episode while taking Topamax in the past (as migraine prevention) Doing much better with lamotrigine 100 mg twice daily, which also helped her mood    Spastic left hemiparesis following right internal carotid artery dissection, right MCA stroke in June 2000   Botulism toxin injection every 3 months for spastic left  hemiparesis injection  Under EMG,  injected Botox A 300 units  Left pronator teres 25 units Left flexor digitorum profundus 25 units Left flexor digitorum superficialis 25 units Left palmaris longus 25 units  Left pectoralis major 50 units Left latissimus dorsi 50 units  Left flexor digitorum longus 50 units Left tibialis posterior 50 units   DIAGNOSTIC DATA (LABS, IMAGING, TESTING) - I reviewed patient records, labs, notes, testing and imaging myself where available. HISTORICAL  Ruth Gregory is a 62 year old right-handed female  History of right internal carotid section with spastic left hemiparesis, Botox injection every 3 months  She has history of right internal carotid artery dissection following an episode of vomiting from migraine headache in mid June 2009. She was given IV TPA and found to have right MCA occlusion. She underwent emergent right carotid artery stent with distal endovascular recanalization of the middle cerebral artery.  She had small hemorrhagic transformation of the right basal ganglia with mild left  hemiparesis, but has done well since then and has been independent.  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  She has been receiving BOTOX injection since 01/2009 every 3 months, to her left upper extremity and also left lower extremity, responded well.   Repeat US carotid were normal in October 2013  Chronic migraine headaches: She had long-standing history of chronic migraine, average 2-4 headaches each months, she has increased frequency headaches around fall, and spring, she is now taking Topamax ER 150 mg every night as preventive medication, magnesium oxide 400 mg, riboflavin 100 mg twice a day,  She is not candidate for triptan because previous history of stroke, previously Cambia works well for her, she her insurance only allow her to have 9 tablets each months. Occasionally she take Fioricet, hydrocodone as needed, even prednisone 20 mg as needed as rescue therapy  Sometimes she received Botox injection as migraine prevention, previously taking Topamax for many years, there was no reported seizure-like spells,  Her migraine eventually responded very well to [place] as needed, she tapered off Topamax few month ago, since May, she reported recurrent 2 episode of sudden onset transient confusion, most recent episode was  on January 18, 2021, while driving, she began to turn towards the left side, had transient confusion, visual distortion, likely there was no incident, there was no  seizure-like activity  We personally reviewed CT head in June 2009, right MCA infarction stable hemorrhagic component, right to left shift  UPDATE Apr 06 2021: She complains of increased headache over the past few months, likely attributed to the stress from holiday, and also weather changes, [name] continue to work, she takes about twice a week  She has no recurrent confusion spells, but did not titrating up lamotrigine dosage as discussed during last office visit,  EEG in November 2022 showed intermittent protrusion of slower wave dysrhythmic involving right frontotemporal region, no epileptiform discharge, but suggest focal irritability, consistent with her history of right MCA stroke  She also complains of mild memory loss, difficulty focusing, difficulty multitasking,  UPDATE  Apr 29 2021: She is overall doing well, tolerating titrating dose of lamotrigine, which has helped her mood, no longer has confusion spells, migraine headache overall is under good control, return for botulism toxin injection for spastic left upper and lower extremity   Update July 23, 2021: She tolerated lamotrigine 100 mg twice daily well, no recurrent seizure-like spells, also helped her mood, Migraine is overall under good control  UPDATE July 28th 2023: Heavy object fell on her left foot, broke her left toe, wearing left of boot,  Overall her headache is under good control, intermittent left hand muscle 7,'  Update January 13, 2022: She complains of increased headache, couple times each week, Ubrelvy 100 mg as needed works well, also complains of increased anxiety, difficulty sleeping, was given Ativan, complains of excessive drowsiness next day, now started on gabapentin  Slow worsening unsteady gait, wish to receive physical therapy left ankle tendency for plantarflexion  UPDATE Apr 08 2022: She is overall doing well, did not have significant headache, receiving physical therapy, dry needling which  has been helpful   PHYSICAL EXAM   Vitals:   12/26/19 1427  BP: 140/84  Pulse: 92  Weight: 162 lb (73.5 kg)  Height: 5' 8.5" (1.74 m)   Body mass index is 24.27 kg/m.  Gen: NAD, conversant, well nourised, well groomed      NEUROLOGICAL EXAM:  MENTAL STATUS: Speech/Cognition: Awake, alert, normal speech, oriented to history taking and casual conversation.  CRANIAL NERVES: CN II: Visual fields are full to confrontation.  Pupils are round equal and briskly reactive to light. CN III, IV, VI: extraocular movement are normal. No ptosis. CN V: Facial sensation is intact to light touch. CN VII: Face is symmetric with normal eye closure and smile. CN VIII: Hearing is normal to casual conversation CN IX, X: Palate elevates symmetrically. Phonation is normal. CN XI: Head turning and shoulder shrug are intact  MOTOR: Mild spastic left hemiparesis, tendency for left finger flexion, left arm pronation and left ankle plantarflexion  COORDINATION: There is no trunk or limb ataxia.    GAIT/STANCE: Mildly unsteady gait, tendency for left ankle plantarflexion  Review of system: Pertinent as above   ALLERGIES: Allergies  Allergen Reactions   Penicillins Anaphylaxis and Hives   Imitrex [Sumatriptan]    Other Swelling    kiwi   Sulfa Antibiotics Hives   Zomig [Zolmitriptan]     HOME MEDICATIONS: Current Outpatient Medications  Medication Sig Dispense Refill   albuterol (PROVENTIL HFA) 108 (90 Base) MCG/ACT inhaler 1 to 2 inhalations 10-15 minutes apart every 4 hours if needed for asthma rescue 48  g 3   ALPRAZolam (XANAX) 0.5 MG tablet Take 1/2-1 tablet at Bedtime ONLY if needed for Sleep 30 tablet 0   amphetamine-dextroamphetamine (ADDERALL) 10 MG tablet Take 1/2 to 1 tablet 1 or 2 x daily for ADD 60 tablet 0   aspirin EC 81 MG tablet Take 81 mg by mouth daily.     augmented betamethasone dipropionate (DIPROLENE-AF) 0.05 % cream Apply topically 2 (two) times daily as needed.      Botulinum Toxin Type A (BOTOX) 200 UNITS SOLR Inject 400 Units as directed every 3 (three) months. Change to BOTOX A 100  units     BREO ELLIPTA 100-25 MCG/INH AEPB 1 INHALATION DAILY TO PREVENT ASTHMA 180 each 3   buPROPion (WELLBUTRIN XL) 150 MG 24 hr tablet TAKE 1 TABLET EVERY MORNING FOR MOOD. FOCUS & CONCENTRATION 30 tablet 2   butalbital-acetaminophen-caffeine (FIORICET) 50-325-40 MG tablet Take 1 tablet by mouth daily as needed for headache. Do not refill in less than 30 days, 12 tablet 5   cetirizine (ZYRTEC) 10 MG tablet Take 10 mg by mouth daily.     dexamethasone (DECADRON) 4 MG tablet Take 1 tab 3 x day - 3 days, then 2 x day - 3 days, then 1 tab daily 20 tablet 0   Diclofenac Potassium,Migraine, (CAMBIA) 50 MG PACK MIX 1 PACK WITH 1-2 OUNCES OF WATER AND DRINK AT ONSET OF MIGRAINE CAN REPEAT IN 2 HOURS IF NEEDED MAX 2/24 HOURS 8 each 11   diclofenac Sodium (VOLTAREN) 1 % GEL Apply 4 g topically 4 (four) times daily as needed. 500 g 6   Estradiol (YUVAFEM) 10 MCG TABS vaginal tablet Place 10 mcg vaginally 2 (two) times a week.     fenofibrate micronized (LOFIBRA) 134 MG capsule TAKE 1 CAPSULE EVERY DAY 90 capsule 3   levothyroxine (SYNTHROID) 88 MCG tablet Take 1 tablet daily on an empty stomach with only water for 30 minutes & no Antacid meds, Calcium or Magnesium for 4 hours & avoid Biotin 90 tablet 0   magnesium oxide (MAG-OX) 400 MG tablet Take 400 mg by mouth 2 (two) times daily.      montelukast (SINGULAIR) 10 MG tablet Take 1 tablet Daily  for Allergies. 90 tablet 3   Multiple Vitamin (MULTIVITAMIN WITH MINERALS) TABS tablet Take 1 tablet by mouth daily.     Multiple Vitamins-Minerals (ZINC PO) Take 1 tablet by mouth daily.     nortriptyline (PAMELOR) 25 MG capsule Take 25 mg by mouth at bedtime.     omeprazole (PRILOSEC) 40 MG capsule TAKE 1 CAPSULE DAILY FOR ACID INDIGESTION & REFLUX 90 capsule 3   ondansetron (ZOFRAN ODT) 8 MG disintegrating tablet Dissolve 1 tablet under  tongue every 6 to 8 hours for nausea  or vomitting 30 tablet 0   Riboflavin 100 MG TABS Take 100 mg by mouth. bid     rosuvastatin (CRESTOR) 40 MG tablet Take 1 tablet (40 mg total) by mouth daily. Take 1 tablet Daily for Cholesterol 90 tablet 3   topiramate (TOPAMAX) 100 MG tablet Take 2 tablets (200 mg total) by mouth daily. 180 tablet 3   verapamil (CALAN-SR) 240 MG CR tablet TAKE 1 TABLET DAILY WITH FOOD FOR BP & MIGRAINE PREVENTION 90 tablet 3   vitamin B-12 (CYANOCOBALAMIN) 1000 MCG tablet Take 1,000 mcg by mouth daily.     Vitamin D, Ergocalciferol, (DRISDOL) 1.25 MG (50000 UT) CAPS capsule Take 1 capsule daily for Vitamin D deficiency. 90 capsule 1  Erenumab-aooe (AIMOVIG) 70 MG/ML SOAJ Inject 70 mg into the skin every 30 (thirty) days. 1 mL 11   Ubrogepant (UBRELVY) 50 MG TABS Take 50 mg by mouth as needed. May repeat once in 2 hours 12 tablet 11   No current facility-administered medications for this visit.    PAST MEDICAL HISTORY: Past Medical History:  Diagnosis Date   Anemia    Anxiety    Asthma    Chronic heartburn    Depression    Elevated cholesterol    on Crestor   Headache(784.0)    Hypertension    Hypothyroidism    S/P thyroidectomy   Migraine    Occlusion and stenosis of carotid artery with cerebral infarction    Spasm of muscle    Unspecified cerebral artery occlusion with cerebral infarction    Vitamin D deficiency     PAST SURGICAL HISTORY: Past Surgical History:  Procedure Laterality Date   AUGMENTATION MAMMAPLASTY Bilateral 2000   COSMETIC SURGERY     CYSTOCELE REPAIR N/A 01/08/2013   Procedure: ANTERIOR REPAIR (CYSTOCELE);  Surgeon: Meriel Pica, MD;  Location: [facility];  Service: Gynecology;  Laterality: N/A;   NASAL SINUS SURGERY     VAGINAL HYSTERECTOMY      FAMILY HISTORY: Family History  Problem Relation Age of Onset   Heart disease Mother    Peptic Ulcer Disease Mother    Heart disease Father    Parkinson's disease Father      SOCIAL HISTORY:  Social History   Socioeconomic History   Marital status: Married    Spouse name: Brett Canales   Number of children: 2   Years of education: college   Highest education level: Not on file  Occupational History   Occupation: DESIGNER    Employer: S[employer]  Tobacco Use   Smoking status: Never Smoker   Smokeless tobacco: Never Used  Substance and Sexual Activity   Alcohol use: Yes    Comment: occassionally drinks a glass of wine   Drug use: No   Sexual activity: Not on file  Other Topics Concern   Not on file  Social History Narrative   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 Patient is married and lives with her husband. Patient has college education.   Right handed.   Caffeine- two cups daily.   Social Determinants of Health   Financial Resource Strain:    Difficulty of Paying Living Expenses: Not on file  Food Insecurity:    Worried About Programme researcher, broadcasting/film/video in the Last Year: Not on file   [employer] of Food in the Last Year: Not on file  Transportation Needs:    Lack of Transportation (Medical): Not on file   Lack of Transportation (Non-Medical): Not on file  Physical Activity:    Days of Exercise per Week: Not on file   Minutes of Exercise per Session: Not on file  Stress:    Feeling of Stress : Not on file  Social Connections:    Frequency of Communication with Friends and Family: Not on file   Frequency of Social Gatherings with Friends and Family: Not on file   Attends Religious Services: Not on file   Active Member of Clubs or Organizations: Not on file   Attends Banker Meetings: Not on file   Marital Status: Not on file  Intimate Partner Violence:    Fear of Current or Ex-Partner: Not on file   Emotionally Abused: Not on file  Physically Abused: Not on file   Sexually Abused: Not on file      Marcial Pacas, M.D. Ph.D.  [facility] [address] [place], [place] [address] Ph: [phone] Fax: [phone]

## 2022-04-12 ENCOUNTER — Ambulatory Visit: Payer: Medicare Other | Admitting: Physical Therapy

## 2022-04-12 MED ORDER — ONABOTULINUMTOXINA 100 UNITS IJ SOLR
300.0000 [IU] | Freq: Once | INTRAMUSCULAR | Status: AC
  Administered 2022-04-12: 300 [IU] via INTRAMUSCULAR

## 2022-04-12 NOTE — Addendum Note (Signed)
Addended by: Marcial Pacas on: 04/12/2022 12:03 PM   Modules accepted: Orders

## 2022-04-13 ENCOUNTER — Ambulatory Visit: Payer: Medicare Other | Admitting: Physical Therapy

## 2022-04-14 DIAGNOSIS — J208 Acute bronchitis due to other specified organisms: Secondary | ICD-10-CM | POA: Diagnosis not present

## 2022-04-14 DIAGNOSIS — J45998 Other asthma: Secondary | ICD-10-CM | POA: Diagnosis not present

## 2022-04-14 DIAGNOSIS — R059 Cough, unspecified: Secondary | ICD-10-CM | POA: Diagnosis not present

## 2022-04-14 DIAGNOSIS — J40 Bronchitis, not specified as acute or chronic: Secondary | ICD-10-CM | POA: Diagnosis not present

## 2022-04-19 ENCOUNTER — Ambulatory Visit: Payer: Medicare Other | Admitting: Physical Therapy

## 2022-04-23 ENCOUNTER — Other Ambulatory Visit: Payer: Self-pay | Admitting: Nurse Practitioner

## 2022-04-23 ENCOUNTER — Telehealth: Payer: Self-pay | Admitting: Nurse Practitioner

## 2022-04-23 DIAGNOSIS — F988 Other specified behavioral and emotional disorders with onset usually occurring in childhood and adolescence: Secondary | ICD-10-CM

## 2022-04-23 MED ORDER — AMPHETAMINE-DEXTROAMPHETAMINE 10 MG PO TABS: ORAL_TABLET | ORAL | 0 refills | Status: DC

## 2022-04-23 NOTE — Progress Notes (Signed)
PDMP is reviewed and appropriate  

## 2022-04-23 NOTE — Telephone Encounter (Signed)
Pt is requesting a refill on Adderall to be sent to [facility] in [place]

## 2022-04-29 ENCOUNTER — Ambulatory Visit: Payer: Medicare Other | Attending: Neurology | Admitting: Physical Therapy

## 2022-04-29 ENCOUNTER — Encounter: Payer: Self-pay | Admitting: Physical Therapy

## 2022-04-29 DIAGNOSIS — M6281 Muscle weakness (generalized): Secondary | ICD-10-CM

## 2022-04-29 DIAGNOSIS — M542 Cervicalgia: Secondary | ICD-10-CM

## 2022-04-29 DIAGNOSIS — R2681 Unsteadiness on feet: Secondary | ICD-10-CM

## 2022-04-29 DIAGNOSIS — R296 Repeated falls: Secondary | ICD-10-CM

## 2022-04-29 DIAGNOSIS — R2689 Other abnormalities of gait and mobility: Secondary | ICD-10-CM

## 2022-04-29 DIAGNOSIS — I639 Cerebral infarction, unspecified: Secondary | ICD-10-CM

## 2022-04-29 NOTE — Therapy (Signed)
[facility] [address] [place], [place], [address] Phone: [phone]   Fax:  [phone]  Patient Details - Non-Visit Discharge Name: Ruth Gregory MRN: [id number] Date of Birth: December 30, 1960 Referring Provider:  No ref. provider found  Encounter Date: 04/29/2022  Pt no-showed for rescheduled discharge appt.  Pt discharged from PT at this time.  Bary Richard, PT, DPT 04/29/2022, 3:07 PM  [facility] [address] [place], [place], [address] Phone: [phone]   Fax:  [phone]

## 2022-05-11 NOTE — Progress Notes (Unsigned)
Assessment and Plan:  There are no diagnoses linked to this encounter.    Further disposition pending results of labs. Discussed med's effects and SE's.   Over 30 minutes of exam, counseling, chart review, and critical decision making was performed.   Future Appointments  Date Time Provider [place]  05/12/2022 11:15 AM Alycia Rossetti, NP [facility] None  05/17/2022 10:30 AM Unk Pinto, MD [facility] None  07/05/2022  3:00 PM Marcial Pacas, MD [facility] None  11/08/2022 11:00 AM Unk Pinto, MD [facility] None    ------------------------------------------------------------------------------------------------------------------   HPI There were no vitals taken for this visit. 62 y.o.female presents for  Past Medical History:  Diagnosis Date   Anemia    Anxiety    Asthma    Chronic heartburn    Depression    Elevated cholesterol    on Crestor   Headache(784.0)    Hypertension    Hypothyroidism    S/P thyroidectomy   Migraine    Occlusion and stenosis of carotid artery with cerebral infarction    Spasm of muscle    Unspecified cerebral artery occlusion with cerebral infarction    Vitamin D deficiency      Allergies  Allergen Reactions   Penicillins Anaphylaxis and Hives   Imitrex [Sumatriptan]    Kiwi Extract Swelling   Other Swelling    kiwi   Sulfa Antibiotics Hives   Zomig [Zolmitriptan]     Current Outpatient Medications on File Prior to Visit  Medication Sig   albuterol (PROVENTIL HFA) 108 (90 Base) MCG/ACT inhaler 1 to 2 inhalations 10-15 minutes apart every 4 hours if needed for asthma rescue   ALPRAZolam (XANAX) 0.5 MG tablet TAKE 1/2-1 TABLET AT BEDTIME IF NEEDED FOR SLEEP&LIMIT TO 5 DAYS/WEEK TO AVOID ADDICTION&DEMENTIA Strength: 0.5 mg   amphetamine-dextroamphetamine (ADDERALL) 10 MG tablet Take  1/2 - 1 tablet  1 or 2 x /day  for ADD   botulinum toxin Type A (BOTOX) 100 units SOLR injection Inject 300 Units into the muscle every 3  (three) months.   buPROPion (WELLBUTRIN XL) 150 MG 24 hr tablet TAKE 1 TABLET EVERY MORNING FOR MOOD. FOCUS & CONCENTRATION   butalbital-acetaminophen-caffeine (FIORICET) 50-325-40 MG tablet TAKE 1 TABLET BY MOUTH DAILY AS NEEDED FOR HEADACHE. DO NOT REFILL IN LESS THAN 30 DAYS,   cetirizine (ZYRTEC) 10 MG tablet Take 10 mg by mouth daily.   Estradiol 10 MCG TABS vaginal tablet Place 10 mcg vaginally 2 (two) times a week.   fenofibrate micronized (LOFIBRA) 134 MG capsule TAKE 1 CAPSULE DAILY FOR TRIGLYCERIDES (BLOOD FATS)   fluticasone furoate-vilanterol (BREO ELLIPTA) 100-25 MCG/ACT AEPB Use  1 Inhalation  Daily   lamoTRIgine (LAMICTAL) 100 MG tablet Take 1 tablet (100 mg total) by mouth 2 (two) times daily.   levothyroxine (SYNTHROID) 88 MCG tablet TAKE 1 TABLET DAILY ON AN EMPTY STOMACH WITH ONLY WATER FOR 30 MINS& NO ANTACID MEDS, CALCIUM, OR MAGNESIUM FOR 4 HOURS. AVOID BIOTIN   magnesium oxide (MAG-OX) 400 MG tablet Take 400 mg by mouth 2 (two) times daily.    meloxicam (MOBIC) 7.5 MG tablet Take 1 tablet (7.5 mg total) by mouth 2 (two) times daily as needed for pain. (Patient not taking: Reported on 02/08/2022)   montelukast (SINGULAIR) 10 MG tablet TAKE 1 TABLET BY MOUTH AT BEDTIME FOR ALLERGIES   omeprazole (PRILOSEC) 40 MG capsule TAKE 1 CAPSULE DAILY TO PREVENT HEARTBURN & INDIGESTION / PATIENT KNOWS TO TAKE BY MOUTH   ondansetron (ZOFRAN-ODT) 8 MG disintegrating  tablet DISSOLVE 1 TABLET UNDER TONGUE EVERY 6 TO 8 HOURS FOR NAUSEA OR VOMITTING   OVER THE COUNTER MEDICATION Key E Suppositories   Riboflavin 100 MG TABS Take 100 mg by mouth. bid   rosuvastatin (CRESTOR) 40 MG tablet TAKE 1 TABLET BY MOUTH EVERY DAY FOR CHOLESTEROL   UBRELVY 100 MG TABS TAKE 1 TABLET BY MOUTH AS NEEDED AS DIRECTED   verapamil (CALAN-SR) 240 MG CR tablet TAKE 1 TABLET DAILY WITH FOOD FOR BLOOD PRESSURE & MIGRAINE PREVENTION   vitamin B-12 (CYANOCOBALAMIN) 1000 MCG tablet Take 1,000 mcg by mouth daily.    Vitamin D, Ergocalciferol, (DRISDOL) 1.25 MG (50000 UNIT) CAPS capsule Take 1 capsule three days a week for Vitamin D deficiency.   zinc gluconate 50 MG tablet Take 50 mg by mouth daily.   Current Facility-Administered Medications on File Prior to Visit  Medication   botulinum toxin Type A (BOTOX) injection 300 Units   botulinum toxin Type A (BOTOX) injection 300 Units    ROS: all negative except above.   Physical Exam:  There were no vitals taken for this visit.  General Appearance: Well nourished, in no apparent distress. Eyes: PERRLA, EOMs, conjunctiva no swelling or erythema Sinuses: No Frontal/maxillary tenderness ENT/Mouth: Ext aud canals clear, TMs without erythema, bulging. No erythema, swelling, or exudate on post pharynx.  Tonsils not swollen or erythematous. Hearing normal.  Neck: Supple, thyroid normal.  Respiratory: Respiratory effort normal, BS equal bilaterally without rales, rhonchi, wheezing or stridor.  Cardio: RRR with no MRGs. Brisk peripheral pulses without edema.  Abdomen: Soft, + BS.  Non tender, no guarding, rebound, hernias, masses. Lymphatics: Non tender without lymphadenopathy.  Musculoskeletal: Full ROM, 5/5 strength, normal gait.  Skin: Warm, dry without rashes, lesions, ecchymosis.  Neuro: Cranial nerves intact. Normal muscle tone, no cerebellar symptoms. Sensation intact.  Psych: Awake and oriented X 3, normal affect, Insight and Judgment appropriate.     Alycia Rossetti, NP 1:14 PM [facility]

## 2022-05-12 ENCOUNTER — Encounter: Payer: Self-pay | Admitting: Nurse Practitioner

## 2022-05-12 ENCOUNTER — Ambulatory Visit (INDEPENDENT_AMBULATORY_CARE_PROVIDER_SITE_OTHER): Payer: Medicare Other | Admitting: Nurse Practitioner

## 2022-05-12 VITALS — BP 112/72 | HR 89 | Temp 97.5°F | Ht 69.0 in | Wt 161.4 lb

## 2022-05-12 DIAGNOSIS — R350 Frequency of micturition: Secondary | ICD-10-CM | POA: Diagnosis not present

## 2022-05-12 DIAGNOSIS — R300 Dysuria: Secondary | ICD-10-CM | POA: Diagnosis not present

## 2022-05-12 DIAGNOSIS — R0989 Other specified symptoms and signs involving the circulatory and respiratory systems: Secondary | ICD-10-CM

## 2022-05-12 NOTE — Patient Instructions (Signed)
Will be placing a referral to Dr. Wannetta Sender Urogynecologist at [facility]  Interstitial Cystitis  Interstitial cystitis is inflammation of the bladder. This condition is also known as painful bladder syndrome. This may cause pain in the bladder area as well as a frequent and urgent need to urinate. The bladder is an organ that stores urine after the urine is made in the kidneys. The severity of interstitial cystitis can vary from person to person. You may have flare-ups, and then your symptoms may go away for a while. For many people, it becomes a long-term (chronic) problem. What are the causes? The cause of this condition is not known. What increases the risk? The following factors may make you more likely to develop this condition: Being female. Having fibromyalgia. Having irritable bowel syndrome (IBS). Having endometriosis. Having chronic fatigue syndrome. This condition may be aggravated by: Stress. Smoking. Spicy foods. What are the signs or symptoms? Symptoms of interstitial cystitis vary, and they can change over time. Symptoms may include: Discomfort or pain in the bladder area, which is in the lower abdomen. Pain can range from mild to severe. The pain may change in intensity as the bladder fills with urine or as it empties. Pain in the pelvic area, between the hip bones. A constant urge to urinate. Frequent urination. Pain during urination. Pain during sex. Blood in the urine. Feeling tired (fatigue). For women, symptoms often get worse during menstruation. How is this diagnosed? This condition is diagnosed based on your symptoms, your medical history, and a physical exam. Your health care provider may need to rule out other conditions and may order other tests, such as: Urine tests. Cystoscopy. For this test, a tool similar to a very thin telescope is used to look into your bladder. Biopsy. This involves taking a sample of tissue from the bladder to be  examined under a microscope. How is this treated? There is no cure for this condition, but treatment can help you control your symptoms. Work closely with your health care provider to find the most effective treatments for you. Treatment options may include: Medicines to relieve pain and reduce how often you feel the need to urinate. This treatment may include: A procedure where a small amount of medicine that eases irritation is put inside your bladder through a catheter (bladder instillation). Lifestyle changes, such as changing your diet or taking steps to control stress. Physical therapy. This may include: Exercises to help relax the pelvic floor muscles. Massage to relax tight muscles (myofascial release). Learning ways to control when you urinate (bladder training). Using a device that provides electrical stimulation to your nerves, which can relieve pain (neuromodulation therapy). The device is placed on your back, where it blocks the nerves that cause you to feel pain in your bladder area. A procedure that stretches your bladder by filling it with air or fluid (hydrodistention). Surgery. This is rare. It is only done for extreme cases, if other treatments do not help. Follow these instructions at home: Lifestyle Learn and practice relaxation techniques, such as deep breathing and muscle relaxation. Get care for your body and mental well-being, such as: Cognitive behavioral therapy (CBT). This therapy changes the way you think or act in response to different situations. This may improve how you feel. Seeing a mental health therapist to evaluate and treat depression, if necessary. Work with your health care provider on other ways to manage pain. Acupuncture may be helpful. Avoid drinking alcohol. Do not use any products  that contain nicotine or tobacco. These products include cigarettes, chewing tobacco, and vaping devices, such as e-cigarettes. If you need help quitting, ask your health  care provider. Eating and drinking Make dietary changes as recommended by your health care provider. You may need to avoid: Spicy foods. Foods that contain a lot of potassium. Limit your intake of drinks that increase your urge to urinate. These include alcohol and caffeinated drinks like soda, coffee, and tea. Bladder training  Use bladder training techniques as directed. Techniques may include: Urinating at scheduled times. Training yourself to delay urination. Keep a bladder diary. Write down the times you urinate and any symptoms that you have. This can help you find out which foods, liquids, or activities make your symptoms worse. Use your bladder diary to schedule bathroom trips. If you are away from home, plan to be near a bathroom at each of your scheduled times. Make sure that you urinate just before you leave the house and just before you go to bed. General instructions Take over-the-counter and prescription medicines only as told by your health care provider. Try a warm or cool compress over your bladder for comfort. Avoid wearing tight clothing. Do exercises to relax your pelvic floor muscles as told by your physical therapist. Keep all follow-up visits. This is important. Where to find more information To find more information or a support group near you, visit: [employer]: urologyhealth.org Interstitial Cystitis Association: ClassPreviews.com.br Contact a health care provider if you have: Symptoms that do not get better with treatment. Pain or discomfort that gets worse. More frequent urges to urinate. A fever. Get help right away if: You have no control over when you urinate. Summary Interstitial cystitis is inflammation of the bladder. This condition may cause pain in the bladder area as well as a frequent and urgent need to urinate. You may have flare-ups of the condition, and then it may go away for a while. For many people, it becomes a long-term (chronic)  problem. There is no cure for interstitial cystitis, but treatment methods are available to control your symptoms. This information is not intended to replace advice given to you by your health care provider. Make sure you discuss any questions you have with your health care provider. Document Revised: 10/19/2019 Document Reviewed: 10/19/2019 Elsevier Patient Education  [employer].

## 2022-05-13 ENCOUNTER — Other Ambulatory Visit: Payer: Self-pay | Admitting: Nurse Practitioner

## 2022-05-13 DIAGNOSIS — R300 Dysuria: Secondary | ICD-10-CM

## 2022-05-13 MED ORDER — NITROFURANTOIN MONOHYD MACRO 100 MG PO CAPS: 100.0000 mg | ORAL_CAPSULE | Freq: Two times a day (BID) | ORAL | 0 refills | Status: AC

## 2022-05-15 DIAGNOSIS — J40 Bronchitis, not specified as acute or chronic: Secondary | ICD-10-CM | POA: Diagnosis not present

## 2022-05-15 DIAGNOSIS — J208 Acute bronchitis due to other specified organisms: Secondary | ICD-10-CM | POA: Diagnosis not present

## 2022-05-15 DIAGNOSIS — R059 Cough, unspecified: Secondary | ICD-10-CM | POA: Diagnosis not present

## 2022-05-15 DIAGNOSIS — J45998 Other asthma: Secondary | ICD-10-CM | POA: Diagnosis not present

## 2022-05-15 LAB — URINALYSIS, ROUTINE W REFLEX MICROSCOPIC
Bilirubin Urine: NEGATIVE
Glucose, UA: NEGATIVE
Hgb urine dipstick: NEGATIVE
Ketones, ur: NEGATIVE
Nitrite: POSITIVE — AB
Protein, ur: NEGATIVE
Specific Gravity, Urine: 1.02 (ref 1.001–1.035)
WBC, UA: >=60 /HPF — AB (ref 0–5)
pH: 6.5 (ref 5.0–8.0)

## 2022-05-15 LAB — URINE CULTURE
MICRO NUMBER:: 14564760
SPECIMEN QUALITY:: ADEQUATE

## 2022-05-15 LAB — MICROSCOPIC MESSAGE

## 2022-05-17 ENCOUNTER — Encounter: Payer: Self-pay | Admitting: Internal Medicine

## 2022-05-17 ENCOUNTER — Telehealth: Payer: Self-pay | Admitting: Neurology

## 2022-05-17 ENCOUNTER — Ambulatory Visit: Payer: Medicare Other | Admitting: Internal Medicine

## 2022-05-17 NOTE — Patient Instructions (Signed)

## 2022-05-17 NOTE — Telephone Encounter (Signed)
Pt scheduled for botox injection for 07/05/22 and will need a new auth before appt, current auth on file expires 04/16/22

## 2022-05-17 NOTE — Progress Notes (Signed)
C A  N  C  E  L  L  E  D   Has a Headache    &   didn't feel like coming in                                    Future Appointments  Date Time Provider Department  05/17/2022 10:30 AM Unk Pinto, MD [facility]  07/05/2022  3:00 PM Marcial Pacas, MD [facility]  11/08/2022 11:00 AM Unk Pinto, MD [facility]      History of Present Illness:     Patient is a very nice  (& anxious)  62 yo MWF with  HTN, HLD, ADD , Prediabetes, migraines, hx/ mild Lt HP 20   Carotid Aa dissection  and Vitamin D Deficiency who had recent EEG suspect for seizures & had med change from Topiramate to Lamictal. Dr Krista Blue follows for Bo Tox injections for her post CVA spasticity. And also manages her Migraines.    Medications     levothyroxine (SYNTHROID) 88 MCG tablet, TAKE 1 TABLET DAILY     fenofibrate micronized (LOFIBRA) 134 MG capsule, TAKE 1 CAPSULE DAILY    rosuvastatin (CRESTOR) 40 MG tablet, Take  1 tablet  Daily  for Cholesterol   verapamil (CALAN-SR) 240 MG CR tablet, TAKE 1 TABLET DAILY     albuterol  HFA inhaler, 1 to 2 inhalations 10-15 minutes apart every 4 hours if needed for asthma rescue   cetirizine 10 MG tablet, Take 10 mg by mouth daily.   BREO ELLIPTA 100-25  Inhale 1 puff daily.   montelukast (SINGULAIR) 10 MG tablet, TAKE 1 TABLET BY MOUTH  AT BEDTIME FOR  ALLERGIES   aspirin EC 81 MG tablet,  daily.    butalbital-acetaminophen-caffeine (FIORICET) 50-325-40 MG tablet, TAKE 1 TABLET DAILY AS NEEDED FOR HEADACHE. DO NOT REFILL IN LESS THAN 30 DAYS,   Ubrogepant (UBRELVY) 100 MG TABS, Take 100 mg by mouth as needed.  Current Outpatient Medications (Hematological):    vitamin B-12 (CYANOCOBALAMIN) 1000 MCG tablet, Take 1,000 mcg by mouth daily.     ALPRAZolam 0.5 MG tablet, Take 1/2 - 1 tablet at Bedtime  ONLY if needed forSleep &  limit to 5 days /week to avoid Addiction & Dementia    ADDERALL) 10 MG tablet, Take  1/2 - 1 tablet  1 or 2 x /day  for ADD   botulinum toxin Type A (BOTOX) 100 units SOLR injection, Inject 300 Units into the muscle every 3 (three) months.   buPROPion XL 150 MG 24 hr tablet, TAKE 1 TABLET EVERY MORNING    Estradiol 10 MCG TABS vaginal tablet, Place 10 mcg vaginally 2  times a week.   lamoTRIgine (LAMICTAL) 100 MG tablet, Take 1 tablet (100 mg total) by mouth 2 (two) times daily.   lamoTRIgine (LAMICTAL) 25 MG tablet, 1 tab bid x one week 2 tab bid x 2nd week 3 tab bid x 3rd week   magnesium oxide (MAG-OX) 400 MG tablet, Take 400 mg by mouth 2 (two) times daily.    nortriptyline 25 MG capsule, TAKE 1 CAPSULE BY MOUTH AT BEDTIME.   omeprazole 40 MG capsule, Take  1 capsule  Daily  to Prevent Heartburn & Indigestion / Patient knows to take by mouth   ondansetron-ODT) 8 MG disintegrating tablet, DISSOLVE 1 TABLET UNDER TONGUE EVERY 6 TO 8 HOURS FOR NAUSEA OR VOMITTING   Riboflavin 100 MG TABS, Take  bid   Vitamin D 50,000  b nu, TAKE 1 CAPSULE DAILY FOR VITAMIN D DEFICIENCY.   zinc 50 MG tablet, Take  daily.  Problem list She has History of CVA (cerebrovascular accident); Chronic migraine; Spastic hemiplegia affecting left nondominant side ([place]); Hyperlipidemia, mixed; Sinusitis, chronic; Vitamin D deficiency; Medication management; History of prediabetes; ADD (attention deficit disorder) without hyperactivity;  Hypothyroidism; GERD (gastroesophageal reflux disease); Anxiety; Fatty liver; Abnormal glucose; Labile hypertension; Spastic hemiplegia of left nondominant side as late effect of cerebrovascular disease ([place]); Chronic migraine w/o aura w/o status migrainosus, not intractable; B12 deficiency; and Partial symptomatic epilepsy with complex partial seizures, not intractable, without status epilepticus ([place]) on their problem list.   Observations/Objective:  There were no vitals taken for this visit.  HEENT - WNL. Neck - supple.  Chest - Clear equal BS. Cor - Nl HS. RRR w/o sig MGR. PP 1(+). No edema. MS- FROM w/o deformities.  Gait Nl. Neuro -  Nl w/o focal abnormalities.  Assessment and Plan:  1. Labile hypertension   2. Hyperlipidemia, mixed  - discussed diet   3. Chronic migraine w/o aura w/o status migrainosus, not intractable   4. Need for immunization against influenza  - Flu Vaccine QUAD 6+ mos PF IM (Fluarix Quad PF)  5. Medication management   Follow Up Instructions:        I discussed the assessment and treatment plan with the patient. The patient was provided an opportunity to ask questions and all were answered. The patient agreed with the plan and demonstrated an understanding of the instructions.  Over 15 minutes of counseling,  chart review and critical decision making was performed      The patient  was advised to call back or seek an in-person evaluation if the symptoms worsen or if the condition fails to improve as anticipated.    Kirtland Bouchard, MD.

## 2022-05-19 ENCOUNTER — Encounter: Payer: Self-pay | Admitting: Nurse Practitioner

## 2022-05-26 ENCOUNTER — Encounter: Payer: Self-pay | Admitting: Internal Medicine

## 2022-05-26 ENCOUNTER — Ambulatory Visit (INDEPENDENT_AMBULATORY_CARE_PROVIDER_SITE_OTHER): Payer: Medicare Other | Admitting: Internal Medicine

## 2022-05-26 VITALS — BP 104/76 | HR 86 | Temp 97.7°F | Resp 16 | Ht 69.0 in | Wt 161.0 lb

## 2022-05-26 DIAGNOSIS — Z79899 Other long term (current) drug therapy: Secondary | ICD-10-CM

## 2022-05-26 DIAGNOSIS — E559 Vitamin D deficiency, unspecified: Secondary | ICD-10-CM

## 2022-05-26 DIAGNOSIS — E782 Mixed hyperlipidemia: Secondary | ICD-10-CM

## 2022-05-26 DIAGNOSIS — F988 Other specified behavioral and emotional disorders with onset usually occurring in childhood and adolescence: Secondary | ICD-10-CM

## 2022-05-26 DIAGNOSIS — R7309 Other abnormal glucose: Secondary | ICD-10-CM | POA: Diagnosis not present

## 2022-05-26 DIAGNOSIS — I10 Essential (primary) hypertension: Secondary | ICD-10-CM

## 2022-05-26 DIAGNOSIS — E039 Hypothyroidism, unspecified: Secondary | ICD-10-CM

## 2022-05-26 NOTE — Progress Notes (Signed)
Future Appointments  Date Time Provider Department  07/05/2022  3:00 PM Marcial Pacas, MD [facility]  11/08/2022 11:00 AM Unk Pinto, MD [facility]      History of Present Illness:      This very nice 62 y.o.  MWF presents for 6 month follow up with HTN, HLD, Pre-Diabetes and Vitamin D Deficiency. Patient has GERD controlled on her meds.        Patient is treated for HTN (2009)  when she had a Rt MCA CVA consequent of a Rt Carotid Artery dissection attributed to Fibromuscular Dysplasia.  Patient has a residual mild Left spastic HP followed by Dr Krista Blue and treated with Botox injections and also Dr Krista Blue manages patients chronic migraines.  Since patient's stroke she has been treated with Welbutrin & low dose Adderall for ADD with improved focus & concentration.  BP has been controlled at home. Today's BP is at goal 104/76 .   Patient has had no complaints of any cardiac type chest pain, palpitations, dyspnea Vertell Limber /PND, dizziness, claudication or dependent edema.        Hyperlipidemia is  not  controlled with diet &Rosuvastatin 40 mg.  Patient denies myalgias or other med SE's. Last Lipids were not at goal :  Lab Results  Component Value Date   CHOL 212 (H) 02/08/2022   HDL 58 02/08/2022   LDLCALC 120 (H) 02/08/2022   TRIG 223 (H) 02/08/2022   CHOLHDL 3.7 02/08/2022     Also, the patient has history of PreDiabetes (A1c 5.8% /2008) and has had no symptoms of reactive hypoglycemia, diabetic polys, paresthesias or visual blurring.  Last A1c was normal & at goal:  Lab Results  Component Value Date   HGBA1C 5.5 11/03/2021                         Patient has been on Thyroid replacement since Thyroid surgery for goiter in 2007.         Further, the patient also has history of Vitamin D Deficiency ("23" /2008) and supplements vitamin D without any suspected side-effects. Last vitamin D was at goal:  Lab Results  Component Value Date   VD25OH 95 11/03/2021     Current  Outpatient Medications on File Prior to Visit  Medication Sig   albuterol HFA inhaler 1 to 2 inhalations 10-15 min apart every 4 hours if needed    ALPRAZolam  0.5 MG tablet Take 1/2-1 tablet at Bedtime ONLY if needed    ADDERALL 10 MG tablet Take      1/2 - 1 tablet       1 or 2 x /day       for ADD   aspirin EC 81 MG tablet Take  daily.   DIPROLENE-AF 0.05 % crm Apply topically 2  times daily as needed.   BOTOX  200 UNITS  Inject 400 Units  every 3  months.    BREO ELLIPTA 100-25  USE 1 INHALATION DAILY    buPROPion -XL) 150 MG  TAKE 1 TABLET EVERY MORNING    FIORICET Take 1 tablet  daily as needed    cetirizine  10 MG  Take 1 daily.   CAMBIA 50 MG PACK MIX 1 PACK WITH  WATER For MIGRAINE    diclofenac  1 % GEL Apply 4 g topically 4  times daily as needed.   AIMOVIG 70 MG/ML  Inject every 30  days.   Estradiol 10 MCG  vaginal tablet Place 10 mcg vaginally 2  times a week.   fenofibrate 134 MG  Take     1 capsule     Daily         levothyroxine  88 MCG tablet Take 1 tablet daily    magnesium oxide  400 MG tablet Take  2  times daily.    Montelukast 10 MG tablet Take 1 tablet Daily   Multi-Vit  w/Minerals Take 1 tablet by mouth daily.   ZINC  Take 1 tablet by mouth daily.   nortriptyline  25 MG capsule Take 1 capsule  at bedtime.   omeprazole 40 MG c TAKE 1 CAPSULE DAILY    ondansetron ODT  8 MG   Dissolve under tongue every 6-8 hrs for nausea     Riboflavin 100 MG TABS Take bid   rosuvastatin  40 MG tablet Take 1 tablet  daily   topiramate  100 MG tablet Take 2 tablets  daily.   UBRELVY) 50 MG TABS Take 50 mg as needed. May repeat once in 2 hours   verapamil-SR 240 MG  TAKE 1 TABLET DAILY WITH FOOD   vitamin B-12  1000 MCG tabl Take  daily.   Vitamin D 50,000 u Take 1 capsule daily for Vitamin D deficiency.    Allergies  Allergen Reactions   Penicillins Anaphylaxis and Hives   Imitrex [Sumatriptan]    Other Swelling    kiwi   Sulfa Antibiotics Hives   Zomig [Zolmitriptan]      PMHx:   Past Medical History:  Diagnosis Date   Anemia    Anxiety    Asthma    Chronic heartburn    Depression    Elevated cholesterol    on Crestor   Headache(784.0)    Hypertension    Hypothyroidism    S/P thyroidectomy   Migraine    Occlusion and stenosis of carotid artery with cerebral infarction    Spasm of muscle    Unspecified cerebral artery occlusion with cerebral infarction    Vitamin D deficiency     Immunization History  Administered Date(s) Administered   Influenza Inj Mdck Quad With Preservative 01/10/2019, 01/25/2020   Influenza Split 01/24/2014   Influenza, Seasonal, Injecte, Preservative Fre 02/09/2016   Influenza-Unspecified 11/28/2011   PFIZER SARS-COV-2 Vaccination 06/11/2019, 07/04/2019   PPD Test 04/04/2013, 07/27/2018   Pneumococcal Polysaccharide-23 03/29/1998   Td 03/29/2002   Tdap 05/18/2016    Past Surgical History:  Procedure Laterality Date   AUGMENTATION MAMMAPLASTY Bilateral 2000   COSMETIC SURGERY     CYSTOCELE REPAIR N/A 01/08/2013   Procedure: ANTERIOR REPAIR (CYSTOCELE);  Surgeon: Margarette Asal, MD;  Location: [facility];  Service: Gynecology;  Laterality: N/A;   NASAL SINUS SURGERY     VAGINAL HYSTERECTOMY      FHx:    Reviewed / unchanged  SHx:    Reviewed / unchanged   Systems Review:  Constitutional: Denies fever, chills, wt changes, headaches, insomnia, fatigue, night sweats, change in appetite. Eyes: Denies redness, blurred vision, diplopia, discharge, itchy, watery eyes.  ENT: Denies discharge, congestion, post nasal drip, epistaxis, sore throat, earache, hearing loss, dental pain, tinnitus, vertigo, sinus pain, snoring.  CV: Denies chest pain, palpitations, irregular heartbeat, syncope, dyspnea, diaphoresis, orthopnea, PND, claudication or edema. Respiratory: denies cough, dyspnea, DOE, pleurisy, hoarseness, laryngitis, wheezing.  Gastrointestinal: Denies dysphagia, odynophagia, heartburn, reflux, water brash,  abdominal pain or cramps, nausea, vomiting, bloating, diarrhea, constipation, hematemesis, melena, hematochezia  or hemorrhoids. Genitourinary: Denies  dysuria, frequency, urgency, nocturia, hesitancy, discharge, hematuria or flank pain. Musculoskeletal: Denies arthralgias, myalgias, stiffness, jt. swelling, pain, limping or strain/sprain.  Skin: Denies pruritus, rash, hives, warts, acne, eczema or change in skin lesion(s). Neuro: No weakness, tremor, incoordination, spasms, paresthesia or pain. Psychiatric: Denies confusion, memory loss or sensory loss. Endo: Denies change in weight, skin or hair change.  Heme/Lymph: No excessive bleeding, bruising or enlarged lymph nodes.  Physical Exam  There were no vitals taken for this visit.  Appears  well nourished, well groomed  and in no distress.  Eyes: PERRLA, EOMs, conjunctiva no swelling or erythema. Sinuses: No frontal/maxillary tenderness ENT/Mouth: EAC's clear, TM's nl w/o erythema, bulging. Nares clear w/o erythema, swelling, exudates. Oropharynx clear without erythema or exudates. Oral hygiene is good. Tongue normal, non obstructing. Hearing intact.  Neck: Supple. Thyroid not palpable. Car 2+/2+ without bruits, nodes or JVD. Chest: Respirations nl with BS clear & equal w/o rales, rhonchi, wheezing or stridor.  Cor: Heart sounds normal w/ regular rate and rhythm without sig. murmurs, gallops, clicks or rubs. Peripheral pulses normal and equal  without edema.  Abdomen: Soft & bowel sounds normal. Non-tender w/o guarding, rebound, hernias, masses or organomegaly.  Lymphatics: Unremarkable.  Musculoskeletal: Full ROM all peripheral extremities, joint stability, 5/5 strength and normal gait.  Skin: Warm, dry without exposed rashes, lesions or ecchymosis apparent.  Neuro: Cranial nerves intact, reflexes equal bilaterally. Sensory-motor testing grossly intact. Tendon reflexes grossly intact.  Pysch: Alert & oriented x 3.  Insight and judgement nl  & appropriate. No ideations.   Assessment and Plan:   1. Essential hypertension  - Continue medication, monitor blood pressure at home.  - Continue DASH diet.  Reminder to go to the ER if any CP,  SOB, nausea, dizziness, severe HA, changes vision/speech.  - CBC with Differential/Platelet - COMPLETE METABOLIC PANEL WITH GFR - Magnesium - TSH  2. Hyperlipidemia, mixed  - Continue diet/meds, exercise,& lifestyle modifications.  - Continue monitor periodic cholesterol/liver & renal functions   - Lipid panel - TSH  3. Abnormal glucose  - Continue diet, exercise  - Lifestyle modifications.  - Monitor appropriate labs.  - Hemoglobin A1c - Insulin, random  4. Acquired hypothyroidism  - TSH  5. Vitamin D deficiency  - Continue supplementation.  - VITAMIN D 25 Hydroxy   6. ADD (attention deficit disorder) without hyperactivity   7. Medication management  - CBC with Differential/Platelet - COMPLETE METABOLIC PANEL WITH GFR - Magnesium - Lipid panel - TSH - Hemoglobin A1c - Insulin, random - VITAMIN D 25 Hydroxy          Discussed  regular exercise, BP monitoring, weight control to achieve/maintain BMI less than 25 and discussed med and SE's. Recommended labs to assess and monitor clinical status with further disposition pending results of labs.  I discussed the assessment and treatment plan with the patient. The patient was provided an opportunity to ask questions and all were answered. The patient agreed with the plan and demonstrated an understanding of the instructions.  I provided over 30 minutes of exam, counseling, chart review and  complex critical decision making.   Kirtland Bouchard, MD

## 2022-05-26 NOTE — Patient Instructions (Signed)

## 2022-05-27 LAB — LIPID PANEL
Cholesterol: 149 mg/dL (ref <200)
HDL: 66 mg/dL (ref >=50)
LDL Cholesterol (Calc): 64 mg/dL (calc)
Non-HDL Cholesterol (Calc): 83 mg/dL (calc) (ref <130)
Total CHOL/HDL Ratio: 2.3 (calc) (ref <5.0)
Triglycerides: 104 mg/dL (ref <150)

## 2022-05-27 LAB — MAGNESIUM: Magnesium: 2 mg/dL (ref 1.5–2.5)

## 2022-05-27 LAB — COMPLETE METABOLIC PANEL WITH GFR
AG Ratio: 2.2 (calc) (ref 1.0–2.5)
ALT: 18 U/L (ref 6–29)
AST: 14 U/L (ref 10–35)
Albumin: 4.8 g/dL (ref 3.6–5.1)
Alkaline phosphatase (APISO): 82 U/L (ref 37–153)
BUN: 19 mg/dL (ref 7–25)
CO2: 28 mmol/L (ref 20–32)
Calcium: 9.5 mg/dL (ref 8.6–10.4)
Chloride: 105 mmol/L (ref 98–110)
Creat: 0.94 mg/dL (ref 0.50–1.05)
Globulin: 2.2 g/dL (calc) (ref 1.9–3.7)
Glucose, Bld: 78 mg/dL (ref 65–99)
Potassium: 4.2 mmol/L (ref 3.5–5.3)
Sodium: 142 mmol/L (ref 135–146)
Total Bilirubin: 0.4 mg/dL (ref 0.2–1.2)
Total Protein: 7 g/dL (ref 6.1–8.1)
eGFR: 68 mL/min/{1.73_m2} (ref >=60)

## 2022-05-27 LAB — HEMOGLOBIN A1C
Hgb A1c MFr Bld: 5.8 % of total Hgb — ABNORMAL HIGH (ref <5.7)
Mean Plasma Glucose: 120 mg/dL
eAG (mmol/L): 6.6 mmol/L

## 2022-05-27 LAB — CBC WITH DIFFERENTIAL/PLATELET
Absolute Monocytes: 360 cells/uL (ref 200–950)
Basophils Absolute: 27 cells/uL (ref 0–200)
Basophils Relative: 0.4 %
Eosinophils Absolute: 68 cells/uL (ref 15–500)
Eosinophils Relative: 1 %
HCT: 43.9 % (ref 35.0–45.0)
Hemoglobin: 15.1 g/dL (ref 11.7–15.5)
Lymphs Abs: 1346 cells/uL (ref 850–3900)
MCH: 31.4 pg (ref 27.0–33.0)
MCHC: 34.4 g/dL (ref 32.0–36.0)
MCV: 91.3 fL (ref 80.0–100.0)
MPV: 11.4 fL (ref 7.5–12.5)
Monocytes Relative: 5.3 %
Neutro Abs: 4998 cells/uL (ref 1500–7800)
Neutrophils Relative %: 73.5 %
Platelets: 197 10*3/uL (ref 140–400)
RBC: 4.81 10*6/uL (ref 3.80–5.10)
RDW: 12.4 % (ref 11.0–15.0)
Total Lymphocyte: 19.8 %
WBC: 6.8 10*3/uL (ref 3.8–10.8)

## 2022-05-27 LAB — TSH: TSH: 0.77 mIU/L (ref 0.40–4.50)

## 2022-05-27 LAB — INSULIN, RANDOM: Insulin: 23.5 u[IU]/mL — ABNORMAL HIGH

## 2022-05-27 LAB — VITAMIN D 25 HYDROXY (VIT D DEFICIENCY, FRACTURES): Vit D, 25-Hydroxy: 91 ng/mL (ref 30–100)

## 2022-05-28 NOTE — Progress Notes (Signed)
<><><><><><><><><><><><><><><><><><><><><><><><><><><><><><><><><> <><><><><><><><><><><><><><><><><><><><><><><><><><><><><><><><><> -   Test results slightly outside the reference range are not unusual. If there is anything important, I will review this with you,  otherwise it is considered normal test values.  If you have further questions,  please do not hesitate to contact me at the office or via My Chart.  <><><><><><><><><><><><><><><><><><><><><><><><><><><><><><><><><> <><><><><><><><><><><><><><><><><><><><><><><><><><><><><><><><><>  -  A1c  Blood sugar and A1c are elevated in the borderline and                                                             early or pre-diabetes range which has the same   300% increased risk for heart attack, stroke, cancer and   alzheimer- type vascular dementia as full blown diabetes.   But the good news is that diet, exercise with                                                        weight loss can cure the early diabetes at this point. <><><><><><><><><><><><><><><><><><><><><><><><><><><><><><><><><> <><><><><><><><><><><><><><><><><><><><><><><><><><><><><><><><><>  -  Lipids much Improved    -  Total  Chol =  149    - Great     !  - - - - - - - - - - - - - - - - - - - - - - - - - was 212 !            (  Ideal  or  Goal is less than 180  !  )  & -  Bad / Dangerous LDL  Chol = 64  - also Excellent  !  - - - - - - - - - - -was 120              (  Ideal  or  Goal is less than 70  !  )  <><><><><><><><><><><><><><><><><><><><><><><><><><><><><><><><><> <><><><><><><><><><><><><><><><><><><><><><><><><><><><><><><><><>  -  Thyroid Normal & OK  <><><><><><><><><><><><><><><><><><><><><><><><><><><><><><><><><>  -  Vitamin D = 91  - Excellent   !     Please keep dose same  <><><><><><><><><><><><><><><><><><><><><><><><><><><><><><><><><> <><><><><><><><><><><><><><><><><><><><><><><><><><><><><><><><><>  -  All Else - CBC - Kidneys -  Electrolytes - Liver - Magnesium & Thyroid    - all  Normal / OK <><><><><><><><><><><><><><><><><><><><><><><><><><><><><><><><><>  -  Keep up  the Great Work  !  <><><><><><><><><><><><><><><><><><><><><><><><><><><><><><><><><> <><><><><><><><><><><><><><><><><><><><><><><><><><><><><><><><><>

## 2022-05-31 ENCOUNTER — Other Ambulatory Visit (HOSPITAL_COMMUNITY): Payer: Self-pay

## 2022-05-31 NOTE — Telephone Encounter (Signed)
   Buy and [employer]

## 2022-05-31 NOTE — Telephone Encounter (Signed)
Benefit Verification [id number] Submitted!

## 2022-05-31 NOTE — Telephone Encounter (Signed)
300 units

## 2022-06-03 ENCOUNTER — Other Ambulatory Visit: Payer: Self-pay | Admitting: Nurse Practitioner

## 2022-06-03 DIAGNOSIS — F419 Anxiety disorder, unspecified: Secondary | ICD-10-CM

## 2022-06-03 MED ORDER — ALPRAZOLAM 0.5 MG PO TABS: ORAL_TABLET | ORAL | 0 refills | Status: DC

## 2022-06-08 ENCOUNTER — Telehealth: Payer: Self-pay | Admitting: Neurology

## 2022-06-08 NOTE — Telephone Encounter (Signed)
Pt is asking if there is a direct supplier that she could purchase the UBRELVY 100 MG TABS, from directly.  Pt states she believes she will be able to pay much less thru that means instead of the $600.00 she is having to currently spend out, please call

## 2022-06-13 DIAGNOSIS — J208 Acute bronchitis due to other specified organisms: Secondary | ICD-10-CM | POA: Diagnosis not present

## 2022-06-13 DIAGNOSIS — J40 Bronchitis, not specified as acute or chronic: Secondary | ICD-10-CM | POA: Diagnosis not present

## 2022-06-13 DIAGNOSIS — R059 Cough, unspecified: Secondary | ICD-10-CM | POA: Diagnosis not present

## 2022-06-13 DIAGNOSIS — J45998 Other asthma: Secondary | ICD-10-CM | POA: Diagnosis not present

## 2022-06-16 ENCOUNTER — Other Ambulatory Visit: Payer: Self-pay | Admitting: Nurse Practitioner

## 2022-06-16 DIAGNOSIS — F988 Other specified behavioral and emotional disorders with onset usually occurring in childhood and adolescence: Secondary | ICD-10-CM

## 2022-06-16 MED ORDER — AMPHETAMINE-DEXTROAMPHETAMINE 10 MG PO TABS: ORAL_TABLET | ORAL | 0 refills | Status: DC

## 2022-06-18 ENCOUNTER — Other Ambulatory Visit: Payer: Self-pay | Admitting: Internal Medicine

## 2022-06-18 ENCOUNTER — Other Ambulatory Visit: Payer: Self-pay | Admitting: Neurology

## 2022-06-24 NOTE — Telephone Encounter (Signed)
Routing to work in provider to fill since its a controlled substance and the provider is out on leave: Last seen in office by yan for procedure visit on 04/08/22 Past Dispenses:   Dispensed Days Supply Quantity Provider Pharmacy  BUTALB-ACETAMIN-CAFF 50-325-40 06/01/2022 12 12 each Marcial Pacas, MD [id number]  BUTALB-ACETAMIN-CAFF [id number] 04/28/2022 12 12 each Marcial Pacas, MD [id number]...  BUTALB-ACETAMIN-CAFF [id number] 02/16/2022 12 12 each Marcial Pacas, MD [id number]...  BUTALB-ACETAMIN-CAFF [id number] 01/13/2022 12 12 each Marcial Pacas, MD [id number]...  BUTALB-ACETAMIN-CAFF [id number] 12/14/2021 12 12 each Marcial Pacas, MD [id number]...  BUTALB-ACETAMIN-CAFF [id number] 11/14/2021 30 12 each Marcial Pacas, MD [id number]...  BUTALB-ACETAMIN-CAFF [id number] 09/15/2021 30 12 each Marcial Pacas, MD [id number]...  butalbital-acetaminophen-caff 50-325-40 mg tablet 08/20/2021    No Pharmacy Name  BUTALB-ACETAMIN-CAFF [id number] 08/07/2021 30 12 each Marcial Pacas, MD [id number] - [facility]...  BUTALB-ACETAMIN-CAFF [id number] 07/07/2021 30 12 each Marcial Pacas, MD [id number]

## 2022-06-28 NOTE — Progress Notes (Signed)
Assessment and Plan:  There are no diagnoses linked to this encounter.    Further disposition pending results of labs. Discussed med's effects and SE's.   Over 30 minutes of exam, counseling, chart review, and critical decision making was performed.   Future Appointments  Date Time Provider [place]  06/29/2022  2:00 PM Alycia Rossetti, NP [facility] None  07/05/2022  3:00 PM Marcial Pacas, MD [facility] None  09/09/2022  2:45 PM Alycia Rossetti, NP [facility] None  12/21/2022 11:00 AM Unk Pinto, MD [facility] None    ------------------------------------------------------------------------------------------------------------------   HPI There were no vitals taken for this visit. 62 y.o.female presents for  Past Medical History:  Diagnosis Date   Anemia    Anxiety    Asthma    Chronic heartburn    Depression    Elevated cholesterol    on Crestor   Headache(784.0)    Hypertension    Hypothyroidism    S/P thyroidectomy   Migraine    Occlusion and stenosis of carotid artery with cerebral infarction    Spasm of muscle    Unspecified cerebral artery occlusion with cerebral infarction    Vitamin D deficiency      Allergies  Allergen Reactions   Penicillins Anaphylaxis and Hives   Imitrex [Sumatriptan]    Kiwi Extract Swelling   Other Swelling    kiwi   Sulfa Antibiotics Hives   Zomig [Zolmitriptan]     Current Outpatient Medications on File Prior to Visit  Medication Sig   albuterol (PROVENTIL HFA) 108 (90 Base) MCG/ACT inhaler 1 to 2 inhalations 10-15 minutes apart every 4 hours if needed for asthma rescue   ALPRAZolam (XANAX) 0.5 MG tablet TAKE 1/2-1 TABLET AT BEDTIME IF NEEDED FOR SLEEP&LIMIT TO 5 DAYS/WEEK TO AVOID ADDICTION&DEMENTIA Strength: 0.5 mg   amphetamine-dextroamphetamine (ADDERALL) 10 MG tablet Take  1/2 - 1 tablet  1 or 2 x /day  for ADD   botulinum toxin Type A (BOTOX) 100 units SOLR injection Inject 300 Units into the muscle every 3  (three) months.   buPROPion (WELLBUTRIN XL) 150 MG 24 hr tablet TAKE 1 TABLET EVERY MORNING FOR MOOD. FOCUS & CONCENTRATION   butalbital-acetaminophen-caffeine (FIORICET) 50-325-40 MG tablet TAKE 1 TABLET BY MOUTH DAILY AS NEEDED FOR HEADACHE. DO NOT REFILL IN LESS THAN 30 DAYS,   cetirizine (ZYRTEC) 10 MG tablet Take 10 mg by mouth daily.   Estradiol 10 MCG TABS vaginal tablet Place 10 mcg vaginally 2 (two) times a week.   fenofibrate micronized (LOFIBRA) 134 MG capsule TAKE 1 CAPSULE DAILY FOR TRIGLYCERIDES (BLOOD FATS)   fluticasone furoate-vilanterol (BREO ELLIPTA) 100-25 MCG/ACT AEPB Use  1 Inhalation Daily   lamoTRIgine (LAMICTAL) 100 MG tablet Take 1 tablet (100 mg total) by mouth 2 (two) times daily.   levothyroxine (SYNTHROID) 88 MCG tablet TAKE 1 TABLET DAILY ON AN EMPTY STOMACH WITH ONLY WATER FOR 30 MINS& NO ANTACID MEDS, CALCIUM, OR MAGNESIUM FOR 4 HOURS. AVOID BIOTIN   magnesium oxide (MAG-OX) 400 MG tablet Take 400 mg by mouth 2 (two) times daily.    montelukast (SINGULAIR) 10 MG tablet TAKE 1 TABLET BY MOUTH AT BEDTIME FOR ALLERGIES   omeprazole (PRILOSEC) 40 MG capsule TAKE 1 CAPSULE DAILY TO PREVENT HEARTBURN & INDIGESTION / PATIENT KNOWS TO TAKE BY MOUTH   ondansetron (ZOFRAN-ODT) 8 MG disintegrating tablet DISSOLVE 1 TABLET UNDER TONGUE EVERY 6 TO 8 HOURS FOR NAUSEA OR VOMITTING   Riboflavin 100 MG TABS Take 100 mg by mouth. bid  rosuvastatin (CRESTOR) 40 MG tablet TAKE 1 TABLET BY MOUTH EVERY DAY FOR CHOLESTEROL   UBRELVY 100 MG TABS TAKE 1 TABLET BY MOUTH AS NEEDED AS DIRECTED   verapamil (CALAN-SR) 240 MG CR tablet TAKE 1 TABLET DAILY WITH FOOD FOR BLOOD PRESSURE & MIGRAINE PREVENTION   vitamin B-12 (CYANOCOBALAMIN) 1000 MCG tablet Take 1,000 mcg by mouth daily.   Vitamin D, Ergocalciferol, (DRISDOL) 1.25 MG (50000 UNIT) CAPS capsule Take 1 capsule three days a week for Vitamin D deficiency.   zinc gluconate 50 MG tablet Take 50 mg by mouth daily.   Current  Facility-Administered Medications on File Prior to Visit  Medication   botulinum toxin Type A (BOTOX) injection 300 Units   botulinum toxin Type A (BOTOX) injection 300 Units    ROS: all negative except above.   Physical Exam:  There were no vitals taken for this visit.  General Appearance: Well nourished, in no apparent distress. Eyes: PERRLA, EOMs, conjunctiva no swelling or erythema Sinuses: No Frontal/maxillary tenderness ENT/Mouth: Ext aud canals clear, TMs without erythema, bulging. No erythema, swelling, or exudate on post pharynx.  Tonsils not swollen or erythematous. Hearing normal.  Neck: Supple, thyroid normal.  Respiratory: Respiratory effort normal, BS equal bilaterally without rales, rhonchi, wheezing or stridor.  Cardio: RRR with no MRGs. Brisk peripheral pulses without edema.  Abdomen: Soft, + BS.  Non tender, no guarding, rebound, hernias, masses. Lymphatics: Non tender without lymphadenopathy.  Musculoskeletal: Full ROM, 5/5 strength, normal gait.  Skin: Warm, dry without rashes, lesions, ecchymosis.  Neuro: Cranial nerves intact. Normal muscle tone, no cerebellar symptoms. Sensation intact.  Psych: Awake and oriented X 3, normal affect, Insight and Judgment appropriate.     Alycia Rossetti, NP 3:30 PM [facility]

## 2022-06-29 ENCOUNTER — Ambulatory Visit (INDEPENDENT_AMBULATORY_CARE_PROVIDER_SITE_OTHER): Payer: Medicare Other | Admitting: Nurse Practitioner

## 2022-06-29 ENCOUNTER — Encounter: Payer: Self-pay | Admitting: Nurse Practitioner

## 2022-06-29 VITALS — BP 112/70 | HR 96 | Temp 97.7°F | Ht 69.0 in | Wt 162.0 lb

## 2022-06-29 DIAGNOSIS — I10 Essential (primary) hypertension: Secondary | ICD-10-CM | POA: Diagnosis not present

## 2022-06-29 DIAGNOSIS — R040 Epistaxis: Secondary | ICD-10-CM | POA: Diagnosis not present

## 2022-06-29 NOTE — Patient Instructions (Signed)
Nosebleed, Adult ?A nosebleed is when blood comes out of the nose. Nosebleeds are common. Usually, they are not a sign of a serious condition. ?Nosebleeds can happen if a blood vessel in your nose starts to bleed or if the lining of your nose (mucous membrane) cracks. They are commonly caused by: ?Allergies. ?Colds. ?Picking your nose. ?Blowing your nose too hard. ?An injury from sticking an object into your nose or getting hit in the nose. ?Dry or cold air. ?Less common causes of nosebleeds include: ?Toxic fumes. ?Something abnormal in the nose or in the air-filled spaces in the bones of the face (sinuses). ?Growths in the nose, such as polyps. ?Blood thinners or conditions that cause blood to clot slowly. ?Certain illnesses or procedures that irritate or dry out the nasal passages. ?Follow these instructions at home: ?When you have a nosebleed: ? ?Sit down and tilt your head slightly forward. ?Use a clean towel or tissue to pinch your nostrils under the bony part of your nose. After 5 minutes, let go of your nose and see if bleeding starts again. Do not release pressure before that time. If there is still bleeding, repeat the pinching and holding for 5 minutes or until the bleeding stops. ?Do not place tissues or gauze in the nose to stop the bleeding. ?Avoid lying down and avoid tilting your head backward. That may make blood collect in the throat and cause gagging or coughing. ?Use a nasal spray decongestant to help with a nosebleed as told by your health care provider. ?After a nosebleed: ?Avoid blowing your nose or sniffing for a number of hours. ?Avoid straining, lifting, or bending at the waist for several days. You may go back to other normal activities as you are able. ?If you are taking aspirin or blood thinners and you have nosebleeds, talk to your health care provider. These medicines make bleeding more likely. ?Ask your health care provider if you should stop taking the medicines or if you should  adjust the dose. ?Do not stop taking medicines that your health care provider has recommended unless he or she tells you to stop taking them. ?If your nosebleed was caused by dry mucous membranes, use over-the-counter saline nasal spray or gel and a humidifier as told by your health care provider. This will keep the mucous membranes moist and allow them to heal. If you need to use one of these products: ?Choose one that is water-soluble. ?Use only as much as you need and use it only as often as needed. ?Do not lie down right after you use it. ?If you get nosebleeds often, talk with your health care provider about medical treatments. Options may include: ?Nasal cautery. This treatment stops and prevents nosebleeds by using a chemical swab or electrical device to lightly burn tiny blood vessels inside the nose. ?Nasal packing. A gauze or other material is placed in the nose to keep constant pressure on the bleeding area. ?Contact a health care provider if you: ?Have a fever. ?Get nosebleeds often or more often than usual. ?Bruise very easily. ?Have a nosebleed from having something stuck in your nose. ?Have bleeding in your mouth. ?Vomit or cough up brown material. ?Have a nosebleed after you start a new medicine. ?Get help right away if: ?You have a nosebleed after a fall or a head injury. ?Your nosebleed does not go away after 20 minutes. ?You feel dizzy or weak. ?You have unusual bleeding from other parts of your body. ?You have unusual bruising on   other parts of your body. ?You become sweaty. ?You vomit blood. ?Summary ?A nosebleed is when blood comes out of the nose. Common causes include allergies, an injury to the nose, or cold or dry air. ?Initial treatment includes applying pressure for 5 minutes. ?Moisturizing the nose with saline nasal spray or gel after a nosebleed may help prevent future bleeding. ?Get help right away if your nosebleed does not go away after 20 minutes. ?This information is not intended  to replace advice given to you by your health care provider. Make sure you discuss any questions you have with your health care provider. ?Document Revised: 01/11/2019 Document Reviewed: 01/11/2019 ?Elsevier Patient Education ? 2022 [employer] ? ?

## 2022-07-05 ENCOUNTER — Encounter: Payer: Self-pay | Admitting: Neurology

## 2022-07-05 ENCOUNTER — Ambulatory Visit: Payer: Medicare Other | Admitting: Neurology

## 2022-07-05 VITALS — BP 135/89 | HR 92 | Ht 69.0 in | Wt 160.5 lb

## 2022-07-05 DIAGNOSIS — Z8673 Personal history of transient ischemic attack (TIA), and cerebral infarction without residual deficits: Secondary | ICD-10-CM

## 2022-07-05 DIAGNOSIS — G43709 Chronic migraine without aura, not intractable, without status migrainosus: Secondary | ICD-10-CM | POA: Diagnosis not present

## 2022-07-05 DIAGNOSIS — I69954 Hemiplegia and hemiparesis following unspecified cerebrovascular disease affecting left non-dominant side: Secondary | ICD-10-CM | POA: Diagnosis not present

## 2022-07-05 MED ORDER — DICLOFENAC POTASSIUM(MIGRAINE) 50 MG PO PACK: PACK | ORAL | 11 refills | Status: DC

## 2022-07-05 MED ORDER — ONABOTULINUMTOXINA 100 UNITS IJ SOLR
300.0000 [IU] | Freq: Once | INTRAMUSCULAR | Status: AC
  Administered 2022-07-05: 300 [IU] via INTRAMUSCULAR

## 2022-07-05 MED ORDER — LAMOTRIGINE 100 MG PO TABS: 100.0000 mg | ORAL_TABLET | Freq: Two times a day (BID) | ORAL | 4 refills | Status: DC

## 2022-07-05 NOTE — Progress Notes (Signed)
Botox 100 units x1vial [id number] 2026/05-EXP  Botox 200units x 5 vial [id number] 2026/06-EXP   Bacteriostatic 0.9% Sodium Chloride- 4 mL total Lot: [id number] Expiration: 11/25 NDC: 10034-961-16  Dx: I35.391  B/B Witnessed by Doretha Sou RN

## 2022-07-05 NOTE — Progress Notes (Signed)
ASSESSMENT AND PLAN  Ruth Gregory is a 62 y.o. female   History of right internal carotid artery dissection from severe vomiting related to migraine headache, right MCA stroke in June 2009  Probable complex partial seizure EEG in November 2022 showed right frontotemporal focal slowing, Reported most recent episode was on January 18, 2021, no driving until seizure-free for 6 months She did not have similar episode while taking Topamax in the past (as migraine prevention) Doing much better with lamotrigine 100 mg twice daily, which also helped her mood    Chronic migraine headaches  Increased migraine headache recently  Ubrevyl 100 mg as needed works well, but concerned about high co-pay  Gave her 2 sample of aimovig 140 mg monthly,  Spastic left hemiparesis following right internal carotid artery dissection, right MCA stroke in June 2000   Botulism toxin injection every 3 months for spastic left  hemiparesis injection  Under EMG,  injected Botox A 300 units  Left pronator teres 25 units Left palmaris longus 25 units  Left pectoralis major 50 units Left latissimus dorsii 50 units  Left flexor digitorum longus 50 units Left tibialis posterior 50 units  Bilateral temporal region, 5 units at each injection site,x5 sites at each side (used 50 units total as migraine prevention)   DIAGNOSTIC DATA (LABS, IMAGING, TESTING) - I reviewed patient records, labs, notes, testing and imaging myself where available. HISTORICAL  Ruth Gregory is a 62 year old right-handed female  History of right internal carotid section with spastic left hemiparesis, Botox injection every 3 months  She has history of right internal carotid artery dissection following an episode of vomiting from migraine headache in mid June 2009. She was given IV TPA and found to have right MCA occlusion. She underwent emergent right carotid artery stent with distal endovascular recanalization of the middle  cerebral artery.  She had small hemorrhagic transformation of the right basal ganglia with mild left hemiparesis, but has done well since then and has been independent.  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  She has been receiving BOTOX injection since 01/2009 every 3 months, to her left upper extremity and also left lower extremity, responded well.   Repeat US carotid were normal in October 2013  Chronic migraine headaches: She had long-standing history of chronic migraine, average 2-4 headaches each months, she has increased frequency headaches around fall, and spring, she is now taking Topamax ER 150 mg every night as preventive medication, magnesium oxide 400 mg, riboflavin 100 mg twice a day,  She is not candidate for triptan because previous history of stroke, previously Cambia works well for her, she her insurance only allow her to have 9 tablets each months. Occasionally she take Fioricet, hydrocodone as needed, even prednisone 20 mg as needed as rescue therapy  Sometimes she received Botox injection as migraine prevention, previously taking Topamax for many years, there was no reported seizure-like spells,  Her migraine eventually responded very well to [place] as needed, she tapered off Topamax  few month ago, since May, she reported recurrent 2 episode of sudden onset transient confusion, most recent episode was on January 18, 2021, while driving, she began to turn towards the left  side, had transient confusion, visual distortion, likely there was no incident, there was no seizure-like activity  We personally reviewed CT head in June 2009, right MCA infarction stable hemorrhagic component, right to left shift  UPDATE Apr 06 2021: She complains of increased headache over the past few months, likely attributed to the stress from holiday, and also weather changes, Bernita RaisinUbrelvy continue to work, she takes about twice a week  She has no recurrent confusion spells, but did not titrating up lamotrigine dosage as discussed during last office visit,  EEG in November 2022 showed intermittent protrusion of slower wave dysrhythmic involving right frontotemporal region, no epileptiform discharge, but suggest focal irritability, consistent with her history of right MCA stroke  She also complains of mild memory loss, difficulty focusing, difficulty multitasking,  UPDATE  Apr 29 2021: She is overall doing well, tolerating titrating dose of lamotrigine, which has helped her mood, no longer has confusion spells, migraine headache overall is under good control, return for botulism toxin injection for spastic left upper and lower extremity   Update July 23, 2021: She tolerated lamotrigine 100 mg twice daily well, no recurrent seizure-like spells, also helped her mood, Migraine is overall under good control  UPDATE July 28th 2023: Heavy object fell on her left foot, broke her left toe, wearing left of boot,  Overall her headache is under good control, intermittent left hand muscle 7,'  Update January 13, 2022: She complains of increased headache, couple times each week, Ubrelvy 100 mg as needed works well, also complains of increased anxiety, difficulty sleeping, was given Ativan, complains of excessive drowsiness next day, now started on gabapentin  Slow worsening unsteady gait, wish to receive physical therapy left ankle tendency for plantarflexion  UPDATE Apr 08 2022: She is overall  doing well, did not have significant headache, receiving physical therapy, dry needling which has been helpful  Update July 05, 2022: Previous injection works well for her with exception of increased left lateral to finger weakness,  Over the past few weeks, she complains of increased headache, for 2 weeks, she used 10 tablets of Ubrelvy as needed, which works much better than all the other migraine abortive medications in the past, but she is very much concerned about the high co-pay   PHYSICAL EXAM   Vitals:   12/26/19 1427  BP: 140/84  Pulse: 92  Weight: 162 lb (73.5 kg)  Height: 5' 8.5" (1.74 m)   Body mass index is 24.27 kg/m.  Gen: NAD, conversant, well nourised, well groomed      NEUROLOGICAL EXAM:  MENTAL STATUS: Speech/Cognition: Awake, alert, normal speech, oriented to history taking and casual conversation.  CRANIAL NERVES: CN II: Visual fields are full to confrontation.  Pupils are round equal and briskly reactive to light. CN III, IV, VI: extraocular movement are normal. No ptosis. CN V: Facial sensation is intact to light touch. CN VII: Face is symmetric with normal eye closure and smile. CN VIII: Hearing is normal to casual conversation CN IX, X: Palate elevates symmetrically. Phonation is normal. CN XI: Head turning and shoulder shrug are intact  MOTOR: Mild spastic left hemiparesis, tendency for left finger flexion, left arm pronation and left ankle plantarflexion  COORDINATION: There is no trunk or limb ataxia.    GAIT/STANCE: Mildly unsteady gait, tendency for  left ankle plantarflexion  Review of system: Pertinent as above   ALLERGIES: Allergies  Allergen Reactions   Penicillins Anaphylaxis and Hives   Imitrex [Sumatriptan]    Other Swelling    kiwi   Sulfa Antibiotics Hives   Zomig [Zolmitriptan]     HOME MEDICATIONS: Current Outpatient Medications  Medication Sig Dispense Refill   albuterol (PROVENTIL HFA) 108 (90 Base) MCG/ACT inhaler  1 to 2 inhalations 10-15 minutes apart every 4 hours if needed for asthma rescue 48 g 3   ALPRAZolam (XANAX) 0.5 MG tablet Take 1/2-1 tablet at Bedtime ONLY if needed for Sleep 30 tablet 0   amphetamine-dextroamphetamine (ADDERALL) 10 MG tablet Take 1/2 to 1 tablet 1 or 2 x daily for ADD 60 tablet 0   aspirin EC 81 MG tablet Take 81 mg by mouth daily.     augmented betamethasone dipropionate (DIPROLENE-AF) 0.05 % cream Apply topically 2 (two) times daily as needed.     Botulinum Toxin Type A (BOTOX) 200 UNITS SOLR Inject 400 Units as directed every 3 (three) months. Change to BOTOX A 100  units     BREO ELLIPTA 100-25 MCG/INH AEPB 1 INHALATION DAILY TO PREVENT ASTHMA 180 each 3   buPROPion (WELLBUTRIN XL) 150 MG 24 hr tablet TAKE 1 TABLET EVERY MORNING FOR MOOD. FOCUS & CONCENTRATION 30 tablet 2   butalbital-acetaminophen-caffeine (FIORICET) 50-325-40 MG tablet Take 1 tablet by mouth daily as needed for headache. Do not refill in less than 30 days, 12 tablet 5   cetirizine (ZYRTEC) 10 MG tablet Take 10 mg by mouth daily.     dexamethasone (DECADRON) 4 MG tablet Take 1 tab 3 x day - 3 days, then 2 x day - 3 days, then 1 tab daily 20 tablet 0   Diclofenac Potassium,Migraine, (CAMBIA) 50 MG PACK MIX 1 PACK WITH 1-2 OUNCES OF WATER AND DRINK AT ONSET OF MIGRAINE CAN REPEAT IN 2 HOURS IF NEEDED MAX 2/24 HOURS 8 each 11   diclofenac Sodium (VOLTAREN) 1 % GEL Apply 4 g topically 4 (four) times daily as needed. 500 g 6   Estradiol (YUVAFEM) 10 MCG TABS vaginal tablet Place 10 mcg vaginally 2 (two) times a week.     fenofibrate micronized (LOFIBRA) 134 MG capsule TAKE 1 CAPSULE EVERY DAY 90 capsule 3   levothyroxine (SYNTHROID) 88 MCG tablet Take 1 tablet daily on an empty stomach with only water for 30 minutes & no Antacid meds, Calcium or Magnesium for 4 hours & avoid Biotin 90 tablet 0   magnesium oxide (MAG-OX) 400 MG tablet Take 400 mg by mouth 2 (two) times daily.      montelukast (SINGULAIR) 10 MG  tablet Take 1 tablet Daily  for Allergies. 90 tablet 3   Multiple Vitamin (MULTIVITAMIN WITH MINERALS) TABS tablet Take 1 tablet by mouth daily.     Multiple Vitamins-Minerals (ZINC PO) Take 1 tablet by mouth daily.     nortriptyline (PAMELOR) 25 MG capsule Take 25 mg by mouth at bedtime.     omeprazole (PRILOSEC) 40 MG capsule TAKE 1 CAPSULE DAILY FOR ACID INDIGESTION & REFLUX 90 capsule 3   ondansetron (ZOFRAN ODT) 8 MG disintegrating tablet Dissolve 1 tablet under tongue every 6 to 8 hours for nausea  or vomitting 30 tablet 0   Riboflavin 100 MG TABS Take 100 mg by mouth. bid     rosuvastatin (CRESTOR) 40 MG tablet Take 1 tablet (40 mg total) by mouth daily. Take 1 tablet Daily for Cholesterol  90 tablet 3   topiramate (TOPAMAX) 100 MG tablet Take 2 tablets (200 mg total) by mouth daily. 180 tablet 3   verapamil (CALAN-SR) 240 MG CR tablet TAKE 1 TABLET DAILY WITH FOOD FOR BP & MIGRAINE PREVENTION 90 tablet 3   vitamin B-12 (CYANOCOBALAMIN) 1000 MCG tablet Take 1,000 mcg by mouth daily.     Vitamin D, Ergocalciferol, (DRISDOL) 1.25 MG (50000 UT) CAPS capsule Take 1 capsule daily for Vitamin D deficiency. 90 capsule 1   Erenumab-aooe (AIMOVIG) 70 MG/ML SOAJ Inject 70 mg into the skin every 30 (thirty) days. 1 mL 11   Ubrogepant (UBRELVY) 50 MG TABS Take 50 mg by mouth as needed. May repeat once in 2 hours 12 tablet 11   No current facility-administered medications for this visit.    PAST MEDICAL HISTORY: Past Medical History:  Diagnosis Date   Anemia    Anxiety    Asthma    Chronic heartburn    Depression    Elevated cholesterol    on Crestor   Headache(784.0)    Hypertension    Hypothyroidism    S/P thyroidectomy   Migraine    Occlusion and stenosis of carotid artery with cerebral infarction    Spasm of muscle    Unspecified cerebral artery occlusion with cerebral infarction    Vitamin D deficiency     PAST SURGICAL HISTORY: Past Surgical History:  Procedure Laterality Date    AUGMENTATION MAMMAPLASTY Bilateral 2000   COSMETIC SURGERY     CYSTOCELE REPAIR N/A 01/08/2013   Procedure: ANTERIOR REPAIR (CYSTOCELE);  Surgeon: Meriel Pica, MD;  Location: [facility];  Service: Gynecology;  Laterality: N/A;   NASAL SINUS SURGERY     VAGINAL HYSTERECTOMY      FAMILY HISTORY: Family History  Problem Relation Age of Onset   Heart disease Mother    Peptic Ulcer Disease Mother    Heart disease Father    Parkinson's disease Father     SOCIAL HISTORY:  Social History   Socioeconomic History   Marital status: Married    Spouse name: Brett Canales   Number of children: 2   Years of education: college   Highest education level: Not on file  Occupational History   Occupation: DESIGNER    Employer: [employer]  Tobacco Use   Smoking status: Never Smoker   Smokeless tobacco: Never Used  Substance and Sexual Activity   Alcohol use: Yes    Comment: occassionally drinks a glass of wine   Drug use: No   Sexual activity: Not on file  Other Topics Concern   Not on file  Social History Narrative   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 Patient is married and lives with her husband. Patient has college education.   Right handed.   Caffeine- two cups daily.   Social Determinants of Health   Financial Resource Strain:    Difficulty of Paying Living Expenses: Not on file  Food Insecurity:    Worried About Programme researcher, broadcasting/film/video in the Last Year: Not on file   [employer] of Food in the Last Year: Not on file  Transportation Needs:    Lack of Transportation (Medical): Not on file   Lack of Transportation (Non-Medical): Not on file  Physical Activity:    Days of Exercise per Week: Not on file   Minutes of Exercise per Session: Not on file  Stress:    Feeling of Stress : Not on file  Social Connections:  Frequency of Communication with Friends and Family: Not on file   Frequency of Social Gatherings with Friends and Family: Not on file   Attends Religious  Services: Not on file   Active Member of Clubs or Organizations: Not on file   Attends Banker Meetings: Not on file   Marital Status: Not on file  Intimate Partner Violence:    Fear of Current or Ex-Partner: Not on file   Emotionally Abused: Not on file   Physically Abused: Not on file   Sexually Abused: Not on file      Levert Feinstein, M.D. Ph.D.  [facility] [address] [place], [place] [address] Ph: [phone] Fax: [phone]

## 2022-07-06 ENCOUNTER — Telehealth: Payer: Self-pay

## 2022-07-06 NOTE — Telephone Encounter (Signed)
Cambia powder packet 50 mg is not covered under the patient's insurance. Covered alternatives are Diclofen pot tab 50, ibuprofen, naproxen, and ketoprofen cap 50 mg.

## 2022-07-07 NOTE — Telephone Encounter (Signed)
Lmtcb 1st attempt

## 2022-07-08 ENCOUNTER — Telehealth: Payer: Self-pay | Admitting: Neurology

## 2022-07-08 MED ORDER — DICLOFENAC POTASSIUM 25 MG PO CAPS: 50.0000 mg | ORAL_CAPSULE | Freq: Two times a day (BID) | ORAL | 5 refills | Status: DC | PRN

## 2022-07-08 NOTE — Telephone Encounter (Signed)
Meds ordered this encounter  Medications   Diclofenac Potassium 25 MG CAPS    Sig: Take 2 capsules (50 mg total) by mouth 2 (two) times daily as needed. As needed for hea    Dispense:  60 capsule    Refill:  5

## 2022-07-08 NOTE — Addendum Note (Signed)
Addended by: Levert Feinstein on: 07/08/2022 06:58 PM   Modules accepted: Orders

## 2022-07-08 NOTE — Telephone Encounter (Signed)
Received a fax from [employer] stating that the cambia is not covered and that she will need diclofenac. Routing to Dr. Terrace Arabia

## 2022-07-08 NOTE — Telephone Encounter (Signed)
Pt has called back to report she is feeling better and no longer needs to come in for the infusion/cocktail.

## 2022-07-08 NOTE — Telephone Encounter (Signed)
Called and spoke to pt and relayed Yan's recommendations. Pt voiced gratitude and understanding.

## 2022-07-08 NOTE — Telephone Encounter (Signed)
Pt called wanting to know if she can come in today for a migraine cocktail. Please advise.

## 2022-07-08 NOTE — Telephone Encounter (Signed)
If her migraine is better, it is ok not to come in for infusion

## 2022-07-14 ENCOUNTER — Encounter: Payer: Self-pay | Admitting: Nurse Practitioner

## 2022-07-14 ENCOUNTER — Ambulatory Visit: Payer: Medicare Other | Admitting: Orthopaedic Surgery

## 2022-07-14 DIAGNOSIS — M542 Cervicalgia: Secondary | ICD-10-CM

## 2022-07-14 MED ORDER — PREDNISONE 10 MG (21) PO TBPK: ORAL_TABLET | ORAL | 3 refills | Status: DC

## 2022-07-14 NOTE — Progress Notes (Signed)
Office Visit Note   Patient: Ruth Gregory           Date of Birth: [birth year]           MRN: [id number] Visit Date: 07/14/2022              Requested by: Lucky Cowboy, MD [address] [place],  [place] [address] PCP: Lucky Cowboy, MD   Assessment & Plan: Visit Diagnoses:  1. Neck pain     Plan: Impression is chronic neck pain without radicular symptoms.  We will make referral to outpatient PT at [facility].  Prednisone Dosepak refilled.  Follow-up as needed.  Follow-Up Instructions: No follow-ups on file.   Orders:  No orders of the defined types were placed in this encounter.  Meds ordered this encounter  Medications   predniSONE (STERAPRED UNI-PAK 21 TAB) 10 MG (21) TBPK tablet    Sig: Take as directed    Dispense:  21 tablet    Refill:  3      Procedures: No procedures performed   Clinical Data: No additional findings.   Subjective: Chief Complaint  Patient presents with   Neck - Pain    HPI  Ruth Gregory is a 62 year old female returns today for chronic neck pain.  Symptoms with radicular symptoms.  Review of Systems   Objective: Vital Signs: There were no vitals taken for this visit.  Physical Exam  Ortho Exam  Examination of the cervical spine and upper extremities are nonfocal.  Specialty Comments:  No specialty comments available.  Imaging: No results found.   PMFS History: Patient Active Problem List   Diagnosis Date Noted   Cerebrovascular accident (CVA) 02/02/2022   Muscle weakness (generalized) 02/02/2022   Gait abnormality 01/13/2022   Closed displaced fracture of proximal phalanx of lesser toe, initial encounter 10/21/2021   Squamous cell carcinoma in situ (SCCIS) of dorsum of left hand 09/11/2021   Arthritis of carpometacarpal [facility]) joint of right thumb 08/11/2021   Degenerative arthritis of distal interphalangeal joint of index finger of right hand 08/11/2021   Degenerative arthritis of distal  interphalangeal joint of middle finger of right hand 08/11/2021   Degenerative arthritis of distal interphalangeal joint of ring finger of right hand 08/11/2021   Partial symptomatic epilepsy with complex partial seizures, not intractable, without status epilepticus 01/21/2021   B12 deficiency 09/18/2020   Chronic migraine w/o aura w/o status migrainosus, not intractable 04/15/2020   Spastic hemiplegia of left nondominant side as late effect of cerebrovascular disease 06/18/2019   Abnormal glucose 07/27/2018   Labile hypertension 07/27/2018   Fatty liver 10/09/2017   GERD (gastroesophageal reflux disease) 10/07/2017   Anxiety 10/07/2017   Hypothyroidism 07/23/2015   Vitamin D deficiency 10/03/2013   Medication management 10/03/2013   History of prediabetes 10/03/2013   ADD (attention deficit disorder) without hyperactivity 10/03/2013   Hyperlipidemia, mixed 04/04/2013   Sinusitis, chronic 04/04/2013   Migraine 09/06/2012   Spastic hemiplegia affecting left nondominant side (HCC) 09/06/2012   History of CVA (cerebrovascular accident)    Past Medical History:  Diagnosis Date   Anemia    Anxiety    Asthma    Chronic heartburn    Depression    Elevated cholesterol    on Crestor   Headache(784.0)    Hypertension    Hypothyroidism    S/P thyroidectomy   Migraine    Occlusion and stenosis of carotid artery with cerebral infarction    Spasm of muscle  Unspecified cerebral artery occlusion with cerebral infarction    Vitamin D deficiency     Family History  Problem Relation Age of Onset   Heart disease Mother    Peptic Ulcer Disease Mother    Heart disease Father    Parkinson's disease Father     Past Surgical History:  Procedure Laterality Date   AUGMENTATION MAMMAPLASTY Bilateral 2000   COSMETIC SURGERY     CYSTOCELE REPAIR N/A 01/08/2013   Procedure: ANTERIOR REPAIR (CYSTOCELE);  Surgeon: Meriel Pica, MD;  Location: [facility];  Service: Gynecology;  Laterality:  N/A;   NASAL SINUS SURGERY     VAGINAL HYSTERECTOMY     Social History   Occupational History   Occupation: DESIGNER    Employer: [employer]  Tobacco Use   Smoking status: Never   Smokeless tobacco: Never  Substance and Sexual Activity   Alcohol use: Yes    Comment: occassionally drinks a glass of wine   Drug use: No   Sexual activity: Not on file

## 2022-07-14 NOTE — Addendum Note (Signed)
Addended by: Wendi Maya on: 07/14/2022 09:43 AM   Modules accepted: Orders

## 2022-07-15 NOTE — Progress Notes (Signed)
Assessment and Plan: Ruth Gregory was seen today for urinary tract infection.  Diagnoses and all orders for this visit:  Hypothyroidism, unspecified type Please take your thyroid medication greater than 30 min before breakfast, separated by at least 4 hours  from antacids, calcium, iron, and multivitamins.  Dysuria Complete 7 day course of Macrobid, if symptoms are not resolved after finishing antibiotic advised to return for culture and do not use further antibiotics until diagnosed -     Urinalysis, Routine w reflex microscopic       Further disposition pending results of labs. Discussed med's effects and SE's.   Over 30 minutes of exam, counseling, chart review, and critical decision making was performed.   Future Appointments  Date Time Provider Department Center  09/09/2022  2:45 PM Raynelle Dick, NP [facility] None  10/07/2022  1:00 PM Levert Feinstein, MD [facility] None  12/21/2022 11:00 AM Lucky Cowboy, MD [facility] None    ------------------------------------------------------------------------------------------------------------------   HPI BP 122/76   Pulse 81   Temp (!) 97.5 F (36.4 C)   Ht 5\' 9"  (1.753 m)   Wt 160 lb 6.4 oz (72.8 kg)   SpO2 98%   BMI 23.69 kg/m   61 y.o.female presents for complaints of possible UTI. Started having symptoms of pain and frequency of urination.  Denies blood , fever and abdominal pain.  Started taking Macrobid that she had left over 2 days ago and has symptoms have started to improve with use of Macrobid.  I will have her complete a 7 day course of macrobid   Currently on Levothyroxine 88 mcg daily and has been well controlled.  Last TSH was: Lab Results  Component Value Date   TSH 0.77 05/26/2022    BP is well controlled with verapamil 240 mg BP Readings from Last 3 Encounters:  07/16/22 122/76  07/05/22 135/89  06/29/22 112/70   BMI is Body mass index is 23.69 kg/m., she has been working on diet and exercise. Wt Readings  from Last 3 Encounters:  07/16/22 160 lb 6.4 oz (72.8 kg)  07/05/22 160 lb 8 oz (72.8 kg)  06/29/22 162 lb (73.5 kg)    Past Medical History:  Diagnosis Date   Anemia    Anxiety    Asthma    Chronic heartburn    Depression    Elevated cholesterol    on Crestor   Headache(784.0)    Hypertension    Hypothyroidism    S/P thyroidectomy   Migraine    Occlusion and stenosis of carotid artery with cerebral infarction    Spasm of muscle    Unspecified cerebral artery occlusion with cerebral infarction    Vitamin D deficiency      Allergies  Allergen Reactions   Penicillins Anaphylaxis and Hives   Imitrex [Sumatriptan]    Kiwi Extract Swelling   Other Swelling    kiwi   Sulfa Antibiotics Hives   Zomig [Zolmitriptan]     Current Outpatient Medications on File Prior to Visit  Medication Sig   albuterol (PROVENTIL HFA) 108 (90 Base) MCG/ACT inhaler 1 to 2 inhalations 10-15 minutes apart every 4 hours if needed for asthma rescue   ALPRAZolam (XANAX) 0.5 MG tablet TAKE 1/2-1 TABLET AT BEDTIME IF NEEDED FOR SLEEP&LIMIT TO 5 DAYS/WEEK TO AVOID ADDICTION&DEMENTIA Strength: 0.5 mg   amphetamine-dextroamphetamine (ADDERALL) 10 MG tablet Take  1/2 - 1 tablet  1 or 2 x /day  for ADD   botulinum toxin Type A (BOTOX) 100 units SOLR injection  Inject 300 Units into the muscle every 3 (three) months.   buPROPion (WELLBUTRIN XL) 150 MG 24 hr tablet TAKE 1 TABLET EVERY MORNING FOR MOOD. FOCUS & CONCENTRATION   butalbital-acetaminophen-caffeine (FIORICET) 50-325-40 MG tablet TAKE 1 TABLET BY MOUTH DAILY AS NEEDED FOR HEADACHE. DO NOT REFILL IN LESS THAN 30 DAYS,   cetirizine (ZYRTEC) 10 MG tablet Take 10 mg by mouth daily.   Diclofenac Potassium 25 MG CAPS Take 2 capsules (50 mg total) by mouth 2 (two) times daily as needed. As needed for hea (Patient not taking: Reported on 07/16/2022)   Estradiol 10 MCG TABS vaginal tablet Place 10 mcg vaginally 2 (two) times a week.   fenofibrate micronized  (LOFIBRA) 134 MG capsule TAKE 1 CAPSULE DAILY FOR TRIGLYCERIDES (BLOOD FATS)   fluticasone furoate-vilanterol (BREO ELLIPTA) 100-25 MCG/ACT AEPB Use  1 Inhalation Daily   lamoTRIgine (LAMICTAL) 100 MG tablet Take 1 tablet (100 mg total) by mouth 2 (two) times daily.   levothyroxine (SYNTHROID) 88 MCG tablet TAKE 1 TABLET DAILY ON AN EMPTY STOMACH WITH ONLY WATER FOR 30 MINS& NO ANTACID MEDS, CALCIUM, OR MAGNESIUM FOR 4 HOURS. AVOID BIOTIN   magnesium oxide (MAG-OX) 400 MG tablet Take 400 mg by mouth 2 (two) times daily.    montelukast (SINGULAIR) 10 MG tablet TAKE 1 TABLET BY MOUTH AT BEDTIME FOR ALLERGIES   omeprazole (PRILOSEC) 40 MG capsule TAKE 1 CAPSULE DAILY TO PREVENT HEARTBURN & INDIGESTION / PATIENT KNOWS TO TAKE BY MOUTH   ondansetron (ZOFRAN-ODT) 8 MG disintegrating tablet DISSOLVE 1 TABLET UNDER TONGUE EVERY 6 TO 8 HOURS FOR NAUSEA OR VOMITTING   predniSONE (STERAPRED UNI-PAK 21 TAB) 10 MG (21) TBPK tablet Take as directed   Riboflavin 100 MG TABS Take 100 mg by mouth. bid   rosuvastatin (CRESTOR) 40 MG tablet TAKE 1 TABLET BY MOUTH EVERY DAY FOR CHOLESTEROL   UBRELVY 100 MG TABS TAKE 1 TABLET BY MOUTH AS NEEDED AS DIRECTED   verapamil (CALAN-SR) 240 MG CR tablet TAKE 1 TABLET DAILY WITH FOOD FOR BLOOD PRESSURE & MIGRAINE PREVENTION   vitamin B-12 (CYANOCOBALAMIN) 1000 MCG tablet Take 1,000 mcg by mouth daily.   Vitamin D, Ergocalciferol, (DRISDOL) 1.25 MG (50000 UNIT) CAPS capsule Take 1 capsule three days a week for Vitamin D deficiency.   zinc gluconate 50 MG tablet Take 50 mg by mouth daily.   Diclofenac Potassium,Migraine, (CAMBIA) 50 MG PACK 1 tab bid prn (Patient not taking: Reported on 07/16/2022)   No current facility-administered medications on file prior to visit.    ROS: all negative except above.   Physical Exam:  BP 122/76   Pulse 81   Temp (!) 97.5 F (36.4 C)   Ht  (1.753 m)   Wt 160 lb 6.4 oz (72.8 kg)   SpO2 98%   BMI 23.69 kg/m   General  Appearance: Well nourished, in no apparent distress. Eyes: PERRLA, EOMs, conjunctiva no swelling or erythema Neck: Supple, thyroid normal.  Respiratory: Respiratory effort normal, BS equal bilaterally without rales, rhonchi, wheezing or stridor.  Cardio: RRR with no MRGs. Brisk peripheral pulses without edema.  Abdomen: Soft, + BS.  Non tender, no guarding, rebound, hernias, masses. Lymphatics: Non tender without lymphadenopathy.  Musculoskeletal: Full ROM, 5/5 strength, normal gait. Negative CVA tenderness Skin: Warm, dry without rashes, lesions, ecchymosis.  Neuro: Cranial nerves intact. Normal muscle tone, no cerebellar symptoms. Sensation intact.  Psych: Awake and oriented X 3, normal affect, Insight and Judgment appropriate.  Raynelle Dick, NP 11:16 AM [name]

## 2022-07-16 ENCOUNTER — Ambulatory Visit (INDEPENDENT_AMBULATORY_CARE_PROVIDER_SITE_OTHER): Payer: Medicare Other | Admitting: Nurse Practitioner

## 2022-07-16 ENCOUNTER — Encounter: Payer: Self-pay | Admitting: Nurse Practitioner

## 2022-07-16 VITALS — BP 122/76 | HR 81 | Temp 97.5°F | Ht 69.0 in | Wt 160.4 lb

## 2022-07-16 DIAGNOSIS — E039 Hypothyroidism, unspecified: Secondary | ICD-10-CM | POA: Diagnosis not present

## 2022-07-16 DIAGNOSIS — R300 Dysuria: Secondary | ICD-10-CM

## 2022-07-16 NOTE — Patient Instructions (Signed)
Urinary Tract Infection, Adult A urinary tract infection (UTI) is an infection of any part of the urinary tract. The urinary tract includes: The kidneys. The ureters. The bladder. The urethra. These organs make, store, and get rid of pee (urine) in the body. What are the causes? This infection is caused by germs (bacteria) in your genital area. These germs grow and cause swelling (inflammation) of your urinary tract. What increases the risk? The following factors may make you more likely to develop this condition: Using a small, thin tube (catheter) to drain pee. Not being able to control when you pee or poop (incontinence). Being female. If you are female, these things can increase the risk: Using these methods to prevent pregnancy: A medicine that kills sperm (spermicide). A device that blocks sperm (diaphragm). Having low levels of a female hormone (estrogen). Being pregnant. You are more likely to develop this condition if: You have genes that add to your risk. You are sexually active. You take antibiotic medicines. You have trouble peeing because of: A prostate that is bigger than normal, if you are female. A blockage in the part of your body that drains pee from the bladder. A kidney stone. A nerve condition that affects your bladder. Not getting enough to drink. Not peeing often enough. You have other conditions, such as: Diabetes. A weak disease-fighting system (immune system). Sickle cell disease. Gout. Injury of the spine. What are the signs or symptoms? Symptoms of this condition include: Needing to pee right away. Peeing small amounts often. Pain or burning when peeing. Blood in the pee. Pee that smells bad or not like normal. Trouble peeing. Pee that is cloudy. Fluid coming from the vagina, if you are female. Pain in the belly or lower back. Other symptoms include: Vomiting. Not feeling hungry. Feeling mixed up (confused). This may be the first symptom in  older adults. Being tired and grouchy (irritable). A fever. Watery poop (diarrhea). How is this treated? Taking antibiotic medicine. Taking other medicines. Drinking enough water. In some cases, you may need to see a specialist. Follow these instructions at home:  Medicines Take over-the-counter and prescription medicines only as told by your doctor. If you were prescribed an antibiotic medicine, take it as told by your doctor. Do not stop taking it even if you start to feel better. General instructions Make sure you: Pee until your bladder is empty. Do not hold pee for a long time. Empty your bladder after sex. Wipe from front to back after peeing or pooping if you are a female. Use each tissue one time when you wipe. Drink enough fluid to keep your pee pale yellow. Keep all follow-up visits. Contact a doctor if: You do not get better after 1-2 days. Your symptoms go away and then come back. Get help right away if: You have very bad back pain. You have very bad pain in your lower belly. You have a fever. You have chills. You feeling like you will vomit or you vomit. Summary A urinary tract infection (UTI) is an infection of any part of the urinary tract. This condition is caused by germs in your genital area. There are many risk factors for a UTI. Treatment includes antibiotic medicines. Drink enough fluid to keep your pee pale yellow. This information is not intended to replace advice given to you by your health care provider. Make sure you discuss any questions you have with your health care provider. Document Revised: 10/26/2019 Document Reviewed: 10/26/2019 Elsevier Patient Education    2023 [employer]  

## 2022-07-17 LAB — URINALYSIS, ROUTINE W REFLEX MICROSCOPIC
Bacteria, UA: NONE SEEN /HPF
Bilirubin Urine: NEGATIVE
Glucose, UA: NEGATIVE
Hgb urine dipstick: NEGATIVE
Hyaline Cast: NONE SEEN /LPF
Ketones, ur: NEGATIVE
Nitrite: NEGATIVE
Protein, ur: NEGATIVE
RBC / HPF: NONE SEEN /HPF (ref 0–2)
Specific Gravity, Urine: 1.02 (ref 1.001–1.035)
pH: 6 (ref 5.0–8.0)

## 2022-07-17 LAB — MICROSCOPIC MESSAGE

## 2022-07-19 ENCOUNTER — Telehealth: Payer: Self-pay | Admitting: Orthopaedic Surgery

## 2022-07-19 DIAGNOSIS — M542 Cervicalgia: Secondary | ICD-10-CM | POA: Diagnosis not present

## 2022-07-19 DIAGNOSIS — M6281 Muscle weakness (generalized): Secondary | ICD-10-CM | POA: Diagnosis not present

## 2022-07-19 NOTE — Telephone Encounter (Signed)
Faxed ov notes to [phone].

## 2022-07-19 NOTE — Telephone Encounter (Signed)
[facility] in [place] asking to please send the office notes again due to there Fax malfunction..[phone]

## 2022-07-21 ENCOUNTER — Other Ambulatory Visit: Payer: Self-pay | Admitting: Neurology

## 2022-07-23 DIAGNOSIS — M6281 Muscle weakness (generalized): Secondary | ICD-10-CM | POA: Diagnosis not present

## 2022-07-23 DIAGNOSIS — M542 Cervicalgia: Secondary | ICD-10-CM | POA: Diagnosis not present

## 2022-07-26 DIAGNOSIS — M542 Cervicalgia: Secondary | ICD-10-CM | POA: Diagnosis not present

## 2022-07-26 DIAGNOSIS — M6281 Muscle weakness (generalized): Secondary | ICD-10-CM | POA: Diagnosis not present

## 2022-08-01 ENCOUNTER — Other Ambulatory Visit: Payer: Self-pay | Admitting: Nurse Practitioner

## 2022-08-01 DIAGNOSIS — E039 Hypothyroidism, unspecified: Secondary | ICD-10-CM

## 2022-08-02 DIAGNOSIS — M6281 Muscle weakness (generalized): Secondary | ICD-10-CM | POA: Diagnosis not present

## 2022-08-02 DIAGNOSIS — M542 Cervicalgia: Secondary | ICD-10-CM | POA: Diagnosis not present

## 2022-08-03 ENCOUNTER — Other Ambulatory Visit: Payer: Self-pay | Admitting: Nurse Practitioner

## 2022-08-03 DIAGNOSIS — F988 Other specified behavioral and emotional disorders with onset usually occurring in childhood and adolescence: Secondary | ICD-10-CM

## 2022-08-03 MED ORDER — AMPHETAMINE-DEXTROAMPHETAMINE 10 MG PO TABS
ORAL_TABLET | ORAL | 0 refills | Status: DC
Start: 2022-08-03 — End: 2022-09-28

## 2022-08-06 DIAGNOSIS — M542 Cervicalgia: Secondary | ICD-10-CM | POA: Diagnosis not present

## 2022-08-06 DIAGNOSIS — M6281 Muscle weakness (generalized): Secondary | ICD-10-CM | POA: Diagnosis not present

## 2022-08-11 ENCOUNTER — Other Ambulatory Visit: Payer: Self-pay | Admitting: Nurse Practitioner

## 2022-08-11 DIAGNOSIS — F419 Anxiety disorder, unspecified: Secondary | ICD-10-CM

## 2022-08-11 DIAGNOSIS — M542 Cervicalgia: Secondary | ICD-10-CM | POA: Diagnosis not present

## 2022-08-11 DIAGNOSIS — M6281 Muscle weakness (generalized): Secondary | ICD-10-CM | POA: Diagnosis not present

## 2022-08-11 DIAGNOSIS — F988 Other specified behavioral and emotional disorders with onset usually occurring in childhood and adolescence: Secondary | ICD-10-CM

## 2022-08-11 MED ORDER — ALPRAZOLAM 0.5 MG PO TABS
ORAL_TABLET | ORAL | 0 refills | Status: DC
Start: 2022-08-11 — End: 2022-11-15

## 2022-08-13 DIAGNOSIS — M542 Cervicalgia: Secondary | ICD-10-CM | POA: Diagnosis not present

## 2022-08-13 DIAGNOSIS — M6281 Muscle weakness (generalized): Secondary | ICD-10-CM | POA: Diagnosis not present

## 2022-08-16 DIAGNOSIS — M542 Cervicalgia: Secondary | ICD-10-CM | POA: Diagnosis not present

## 2022-08-16 DIAGNOSIS — M6281 Muscle weakness (generalized): Secondary | ICD-10-CM | POA: Diagnosis not present

## 2022-08-20 DIAGNOSIS — M6281 Muscle weakness (generalized): Secondary | ICD-10-CM | POA: Diagnosis not present

## 2022-08-20 DIAGNOSIS — M542 Cervicalgia: Secondary | ICD-10-CM | POA: Diagnosis not present

## 2022-08-24 DIAGNOSIS — H10413 Chronic giant papillary conjunctivitis, bilateral: Secondary | ICD-10-CM | POA: Diagnosis not present

## 2022-08-26 DIAGNOSIS — Z85828 Personal history of other malignant neoplasm of skin: Secondary | ICD-10-CM | POA: Diagnosis not present

## 2022-08-26 DIAGNOSIS — D1801 Hemangioma of skin and subcutaneous tissue: Secondary | ICD-10-CM | POA: Diagnosis not present

## 2022-08-26 DIAGNOSIS — D0471 Carcinoma in situ of skin of right lower limb, including hip: Secondary | ICD-10-CM | POA: Diagnosis not present

## 2022-08-26 DIAGNOSIS — L821 Other seborrheic keratosis: Secondary | ICD-10-CM | POA: Diagnosis not present

## 2022-08-31 DIAGNOSIS — M542 Cervicalgia: Secondary | ICD-10-CM | POA: Diagnosis not present

## 2022-08-31 DIAGNOSIS — M6281 Muscle weakness (generalized): Secondary | ICD-10-CM | POA: Diagnosis not present

## 2022-09-02 DIAGNOSIS — Z8679 Personal history of other diseases of the circulatory system: Secondary | ICD-10-CM | POA: Diagnosis not present

## 2022-09-02 DIAGNOSIS — N390 Urinary tract infection, site not specified: Secondary | ICD-10-CM | POA: Diagnosis not present

## 2022-09-02 DIAGNOSIS — N8111 Cystocele, midline: Secondary | ICD-10-CM | POA: Diagnosis not present

## 2022-09-02 DIAGNOSIS — K648 Other hemorrhoids: Secondary | ICD-10-CM | POA: Diagnosis not present

## 2022-09-02 DIAGNOSIS — R350 Frequency of micturition: Secondary | ICD-10-CM | POA: Diagnosis not present

## 2022-09-02 DIAGNOSIS — N398 Other specified disorders of urinary system: Secondary | ICD-10-CM | POA: Diagnosis not present

## 2022-09-09 ENCOUNTER — Ambulatory Visit: Payer: Medicare Other | Admitting: Nurse Practitioner

## 2022-09-15 ENCOUNTER — Other Ambulatory Visit: Payer: Self-pay | Admitting: Nurse Practitioner

## 2022-09-15 DIAGNOSIS — J3089 Other allergic rhinitis: Secondary | ICD-10-CM

## 2022-09-16 DIAGNOSIS — M6281 Muscle weakness (generalized): Secondary | ICD-10-CM | POA: Diagnosis not present

## 2022-09-16 DIAGNOSIS — M542 Cervicalgia: Secondary | ICD-10-CM | POA: Diagnosis not present

## 2022-09-20 NOTE — Progress Notes (Deleted)
FOLLOW UP 6 MONTH   Assessment and Plan:   Chronic migraine Managing well at this time, managed with Neurology and receiving Botox injections Q50months. Verapamil 240mg  daily   Spastic hemiplegia of left nondominant side as late effect of cerebrovascular disease, unspecified cerebrovascular disease type (HCC)/Questionable petit mal  Continue Botox injections, follow with Neurology, Dr Terrace Arabia for this.  Essential Hypertension - continue medications, DASH diet, exercise and monitor at home. Call if greater than 130/80.    Anxiety Doing well at this time Continue Alprazolam with benefit, but will cut down to 0.5 mg for her to try 1/2 as needed Will also cut back on wellbutrin from 300mg  to 150mg  for possible serotonin syndrome.  Stress management techniques discussed, increase water, good sleep hygiene discussed, increase exercise, and increase veggies.   Mixed hyperlipidemia Continue medications: rosuvastatin, fenofibrate- currently doing Rosuvastatin every other day, may need to go back to daily pending results Continue low cholesterol diet and exercise.  Check lipid panel.  -     Lipid panel - CMP  Hypothyroidism, unspecified type Taking levothyroxine 88 mcg daily Reminder to take on an empty stomach 30-54mins before first meal of the day. No antacid medications for 4 hours. -     TSH  ADD (attention deficit disorder) without hyperactivity Doing well on current regiment Continue with benefit -Adderall 10mg  1/2 to 1 tablet BID  Vitamin D deficiency monitor  Other abnormal glucose Recent A1Cs at goal Discussed diet/exercise, weight management  Defer A1C; check CMP  Medication management -     CBC with Differential/Platelet -     COMPLETE METABOLIC PANEL WITH GFR  Overweight,BMI 25.0-25.9 Discussed dietary and exercise modifications She is following a high protein, low fat, low carbs diet high in fruits and vegetables. Discuss exercise recommendations  routinely Continue to monitor weight at each visit  Mild intermittent asthma Has in date albuterol inhaler Doing well Using Breo daily Reminder to rinse mouth after use.    Continue diet and meds as discussed. Further disposition pending results of labs. Discussed med's effects and SE's.   Over 30 minutes of face to face interview, exam, counseling, chart review, and critical decision making was performed.   Future Appointments  Date Time Provider Department Center  09/21/2022 11:00 AM Raynelle Dick, NP [facility] None  10/07/2022  1:00 PM Levert Feinstein, MD [facility] None  12/21/2022 11:00 AM Lucky Cowboy, MD [facility] None    ----------------------------------------------------------------------------------------------------------------------  HPI 62 y.o. female  presents for 3 month follow up on elevated blood pressures w/o diagnosis of HTN, HLD, Migraine, Hypothyroidism,  Pre-Diabetes and Vitamin D Deficiency.   In 2009, she had a Right MCA CVA attributed to a Right Carotid artery dissection from fibromuscular dysplasia and has residual of a mild spastic Left HP and mild cognitive dysfunction difficulty focusing/concentrating. These latter sx's are improved with routine Adderall.  Patient also follow with Dr Terrace Arabia, Neurologist, for Bo-Tox injections for her spasticity and migraines.  Weather and situational stress trigger for migraines. Doing well with Ubrelvy EEG in 01/2022 some mild activity had a few petit mal seizures- started on Lamictal 100mg  BID has not noticed any further episodes since starting the medication.  Going to wean off Nortriptyline.  she has a diagnosis of anxiety and is currently on wellbutrin 150 mg and PRN xanax. Her anxiety has been worse, she is on several other serotonin agents. she reports currently takes xanax rarely, takes 1/2 tab just a few days per month but feels that it is  too strong so does not want to take.   BMI is There is no height or weight on  file to calculate BMI., she has been working on diet and exercise. Doing new nutrition regimen.She is working on exercise. Wt Readings from Last 3 Encounters:  07/16/22 160 lb 6.4 oz (72.8 kg)  07/05/22 160 lb 8 oz (72.8 kg)  06/29/22 162 lb (73.5 kg)   Her blood pressure has been controlled at home, today their BP is   BP Readings from Last 3 Encounters:  07/16/22 122/76  07/05/22 135/89  06/29/22 112/70     She does not workout. She denies any cardiac symptoms, chest pains, palpitations, shortness of breath, dizziness or lower extremity edema.     She is on cholesterol medication Rosuvastatin 40 mg every other day( may need to return to daily), fenofibrate 134 mg daily and denies myalgias. Her cholesterol is at goal. The cholesterol last visit was:   Lab Results  Component Value Date   CHOL 149 05/26/2022   HDL 66 05/26/2022   LDLCALC 64 05/26/2022   TRIG 104 05/26/2022   CHOLHDL 2.3 05/26/2022    She has not been working on diet and exercise for prediabetes (5.8%, 2008), and denies hyperglycemia, hypoglycemia , nausea, polydipsia and polyuria. Last A1C in the office was:  Lab Results  Component Value Date   HGBA1C 5.8 (H) 05/26/2022   Patient has been on Thyroid Supression since Thyroid surgery for benign nodule and goiter in 2007.  She is currently taking levothyroxine 88 mcg daily.   Her medication was not changed last visit.   Lab Results  Component Value Date   TSH 0.77 05/26/2022   Patient is on Vitamin D supplement, has cut back from 50,000 once a week. Lab Results  Component Value Date   VD25OH 91 05/26/2022        Current Medications:   Current Outpatient Medications (Endocrine & Metabolic):    levothyroxine (SYNTHROID) 88 MCG tablet, TAKE 1 TABLET DAILY ON AN EMPTY STOMACH WITH ONLY WATER FOR 30 MINS& NO ANTACID MEDS, CALCIUM, OR MAGNESIUM FOR 4 HOURS. AVOID BIOTIN   predniSONE (STERAPRED UNI-PAK 21 TAB) 10 MG (21) TBPK tablet, Take as directed  Current  Outpatient Medications (Cardiovascular):    fenofibrate micronized (LOFIBRA) 134 MG capsule, TAKE 1 CAPSULE DAILY FOR TRIGLYCERIDES (BLOOD FATS)   rosuvastatin (CRESTOR) 40 MG tablet, TAKE 1 TABLET BY MOUTH EVERY DAY FOR CHOLESTEROL   verapamil (CALAN-SR) 240 MG CR tablet, TAKE 1 TABLET DAILY WITH FOOD FOR BLOOD PRESSURE & MIGRAINE PREVENTION  Current Outpatient Medications (Respiratory):    albuterol (PROVENTIL HFA) 108 (90 Base) MCG/ACT inhaler, 1 to 2 inhalations 10-15 minutes apart every 4 hours if needed for asthma rescue   cetirizine (ZYRTEC) 10 MG tablet, Take 10 mg by mouth daily.   fluticasone furoate-vilanterol (BREO ELLIPTA) 100-25 MCG/ACT AEPB, Use  1 Inhalation Daily   montelukast (SINGULAIR) 10 MG tablet, TAKE 1 TABLET BY MOUTH AT BEDTIME FOR ALLERGIES  Current Outpatient Medications (Analgesics):    butalbital-acetaminophen-caffeine (FIORICET) 50-325-40 MG tablet, TAKE 1 TABLET BY MOUTH DAILY AS NEEDED FOR HEADACHE. DO NOT REFILL IN LESS THAN 30 DAYS,   UBRELVY 100 MG TABS, TAKE 1 TABLET BY MOUTH AS NEEDED AS DIRECTED  Current Outpatient Medications (Hematological):    vitamin B-12 (CYANOCOBALAMIN) 1000 MCG tablet, Take 1,000 mcg by mouth daily.  Current Outpatient Medications (Other):    ALPRAZolam (XANAX) 0.5 MG tablet, TAKE 1/2-1 TABLET AT BEDTIME IF NEEDED FOR SLEEP&LIMIT  TO 5 DAYS/WEEK TO AVOID ADDICTION&DEMENTIA Strength: 0.5 mg   amphetamine-dextroamphetamine (ADDERALL) 10 MG tablet, Take  1/2 - 1 tablet  1 or 2 x /day  for ADD   botulinum toxin Type A (BOTOX) 100 units SOLR injection, Inject 300 Units into the muscle every 3 (three) months.   buPROPion (WELLBUTRIN XL) 150 MG 24 hr tablet, TAKE 1 TABLET EVERY MORNING FOR MOOD. FOCUS & CONCENTRATION   Estradiol 10 MCG TABS vaginal tablet, Place 10 mcg vaginally 2 (two) times a week.   lamoTRIgine (LAMICTAL) 100 MG tablet, Take 1 tablet (100 mg total) by mouth 2 (two) times daily.   magnesium oxide (MAG-OX) 400 MG tablet,  Take 400 mg by mouth 2 (two) times daily.    omeprazole (PRILOSEC) 40 MG capsule, TAKE 1 CAPSULE DAILY TO PREVENT HEARTBURN & INDIGESTION / PATIENT KNOWS TO TAKE BY MOUTH   ondansetron (ZOFRAN-ODT) 8 MG disintegrating tablet, DISSOLVE 1 TABLET UNDER TONGUE EVERY 6 TO 8 HOURS FOR NAUSEA OR VOMITTING   Riboflavin 100 MG TABS, Take 100 mg by mouth. bid   Vitamin D, Ergocalciferol, (DRISDOL) 1.25 MG (50000 UNIT) CAPS capsule, Take 1 capsule three days a week for Vitamin D deficiency.   zinc gluconate 50 MG tablet, Take 50 mg by mouth daily.  Allergies:  Allergies  Allergen Reactions   Penicillins Anaphylaxis and Hives   Imitrex [Sumatriptan]    Kiwi Extract Swelling   Other Swelling    kiwi   Sulfa Antibiotics Hives   Zomig [Zolmitriptan]      Medical History:  Past Medical History:  Diagnosis Date   Anemia    Anxiety    Asthma    Chronic heartburn    Depression    Elevated cholesterol    on Crestor   Headache(784.0)    Hypertension    Hypothyroidism    S/P thyroidectomy   Migraine    Occlusion and stenosis of carotid artery with cerebral infarction    Spasm of muscle    Unspecified cerebral artery occlusion with cerebral infarction    Vitamin D deficiency    Allergies Allergies  Allergen Reactions   Penicillins Anaphylaxis and Hives   Imitrex [Sumatriptan]    Kiwi Extract Swelling   Other Swelling    kiwi   Sulfa Antibiotics Hives   Zomig [Zolmitriptan]     SURGICAL HISTORY She  has a past surgical history that includes Vaginal hysterectomy; Nasal sinus surgery; Cystocele repair (N/A, 01/08/2013); Cosmetic surgery; and Augmentation mammaplasty (Bilateral, 2000).   FAMILY HISTORY Her family history includes Heart disease in her father and mother; Parkinson's disease in her father; Peptic Ulcer Disease in her mother.   SOCIAL HISTORY She  reports that she has never smoked. She has never used smokeless tobacco. She reports current alcohol use. She reports that she  does not use drugs.   Screening Tests: Immunization History  Administered Date(s) Administered   Influenza Inj Mdck Quad With Preservative 01/20/2017, 01/09/2018, 01/10/2019, 01/25/2020   Influenza Split 01/24/2014   Influenza, Seasonal, Injecte, Preservative Fre 02/09/2016   Influenza,inj,Quad PF,6+ Mos 03/10/2021, 02/08/2022   Influenza-Unspecified 11/28/2011   PFIZER(Purple Top)SARS-COV-2 Vaccination 06/11/2019, 07/04/2019   PPD Test 04/04/2013, 04/22/2015, 05/18/2016, 06/20/2017, 07/27/2018, 11/03/2020, 11/03/2021   Pneumococcal Polysaccharide-23 03/29/1998   Td 03/29/2002   Tdap 05/18/2016     Review of Systems:  Review of Systems  Constitutional:  Negative for chills, fever and weight loss.  HENT:  Negative for congestion and hearing loss.   Eyes:  Negative for  blurred vision and double vision.  Respiratory:  Negative for cough and shortness of breath.   Cardiovascular:  Negative for chest pain, palpitations, orthopnea and leg swelling.  Gastrointestinal:  Negative for abdominal pain, constipation, diarrhea, heartburn, nausea and vomiting.  Musculoskeletal:  Negative for falls, joint pain and myalgias.  Skin:  Negative for rash.  Neurological:  Negative for dizziness, tingling, tremors, loss of consciousness and headaches.  Psychiatric/Behavioral:  Negative for depression, memory loss and suicidal ideas.      Physical Exam: There were no vitals taken for this visit. Wt Readings from Last 3 Encounters:  07/16/22 160 lb 6.4 oz (72.8 kg)  07/05/22 160 lb 8 oz (72.8 kg)  06/29/22 162 lb (73.5 kg)    General Appearance: Well nourished, in no apparent distress. Eyes: PERRLA, EOMs, conjunctiva no swelling or erythema Sinuses: No Frontal/maxillary tenderness ENT/Mouth: Ext aud canals clear, TMs without erythema, bulging. No erythema, swelling, or exudate on post pharynx.  Tonsils not swollen or erythematous. Hearing normal.  Neck: Supple, thyroid normal.  Respiratory:  Respiratory effort normal, BS equal bilaterally without rales, rhonchi, wheezing or stridor.  Cardio: RRR with no MRGs. Brisk peripheral pulses without edema.  Abdomen: Soft, + BS.  Non tender, no guarding, rebound, hernias, masses. Lymphatics: Non tender without lymphadenopathy.  Musculoskeletal: Full ROM, 5/5 strength right, 4/5 strength on left. normal gait, slow. Skin: Warm, dry without rashes, lesions, ecchymosis.  Neuro: Cranial nerves intact. No cerebellar symptoms.  Psych: Awake and oriented X 3, normal affect, Insight and Judgment appropriate.    Raynelle Dick, NP 12:37 PM [facility]

## 2022-09-21 ENCOUNTER — Ambulatory Visit: Payer: Medicare Other | Admitting: Nurse Practitioner

## 2022-09-21 DIAGNOSIS — J4520 Mild intermittent asthma, uncomplicated: Secondary | ICD-10-CM

## 2022-09-21 DIAGNOSIS — I10 Essential (primary) hypertension: Secondary | ICD-10-CM

## 2022-09-21 DIAGNOSIS — E782 Mixed hyperlipidemia: Secondary | ICD-10-CM

## 2022-09-21 DIAGNOSIS — E559 Vitamin D deficiency, unspecified: Secondary | ICD-10-CM

## 2022-09-21 DIAGNOSIS — E039 Hypothyroidism, unspecified: Secondary | ICD-10-CM

## 2022-09-21 DIAGNOSIS — Z79899 Other long term (current) drug therapy: Secondary | ICD-10-CM

## 2022-09-21 DIAGNOSIS — R7309 Other abnormal glucose: Secondary | ICD-10-CM

## 2022-09-21 DIAGNOSIS — F988 Other specified behavioral and emotional disorders with onset usually occurring in childhood and adolescence: Secondary | ICD-10-CM

## 2022-09-21 DIAGNOSIS — I69954 Hemiplegia and hemiparesis following unspecified cerebrovascular disease affecting left non-dominant side: Secondary | ICD-10-CM

## 2022-09-21 DIAGNOSIS — E663 Overweight: Secondary | ICD-10-CM

## 2022-09-27 NOTE — Progress Notes (Signed)
FOLLOW UP 6 MONTH   Assessment and Plan:   Chronic migraine Managing well at this time, managed with Neurology and receiving Botox injections Q81months. Verapamil 240mg  daily   Spastic hemiplegia of left nondominant side as late effect of cerebrovascular disease, unspecified cerebrovascular disease type (HCC)/Questionable petit mal  Continue Botox injections, follow with Neurology, Dr Terrace Arabia for this.  Essential Hypertension - continue medications, DASH diet, exercise and monitor at home. Call if greater than 130/80.    Anxiety Doing well at this time Continue Alprazolam with benefit, but will cut down to 0.5 mg for her to try 1/2 as needed Continue wellbutrin 150 mg QD Stress management techniques discussed, increase water, good sleep hygiene discussed, increase exercise, and increase veggies.   Mixed hyperlipidemia Continue medications: rosuvastatin, fenofibrate- currently doing Rosuvastatin every other day, may need to go back to daily pending results Continue low cholesterol diet and exercise.  Check lipid panel.  -     Lipid panel - CMP  Hypothyroidism, unspecified type Taking levothyroxine 88 mcg daily Reminder to take on an empty stomach 30-40mins before first meal of the day. No antacid medications for 4 hours. -     TSH  ADD (attention deficit disorder) without hyperactivity Doing well on current regiment Continue with benefit -Adderall 10mg  using 1 tab daily  Vitamin D deficiency monitor  Other abnormal glucose Recent A1Cs at goal Discussed diet/exercise, weight management  Defer A1C; check CMP  Medication management -     CBC with Differential/Platelet -     COMPLETE METABOLIC PANEL WITH GFR   Mild intermittent asthma Has in date albuterol inhaler Doing well Using Breo daily- refilled today Reminder to rinse mouth after use.    Continue diet and meds as discussed. Further disposition pending results of labs. Discussed med's effects and SE's.    Over 30 minutes of face to face interview, exam, counseling, chart review, and critical decision making was performed.   Future Appointments  Date Time Provider Department Center  10/07/2022  1:00 PM Levert Feinstein, MD [facility] None  12/21/2022 11:00 AM Lucky Cowboy, MD [facility] None    ----------------------------------------------------------------------------------------------------------------------  HPI 62 y.o. female  presents for 3 month follow up on essential HTN, HLD,  Migraine, Hypothyroidism, Abnormal Glucose and Vitamin D Deficiency.    She is due to have bladder surgery in 11/16/22 ROBOTIC XI COLPOPEXY SACRAL, cysotoscopy, possible posterior repair with Dr. Ashley Royalty. Bladder has moved and is holding urine.  She is scheduled to have outpatient surgery in [place]. Continues to have to urinate frequently but no burning.   She is needing a refill on her Virgel Bouquet which does control her Asthma and helps prevents bronchitis.   In 2009, she had a Right MCA CVA attributed to a Right Carotid artery dissection from fibromuscular dysplasia and has residual of a mild spastic Left HP and mild cognitive dysfunction difficulty focusing/concentrating. These latter sx's are improved with routine Adderall.  Patient also follow with Dr Terrace Arabia, Neurologist, for Botox injections for her spasticity and migraines.  Weather and situational stress trigger for migraines. Doing well with Ubrelvy.  EEG in 01/2022 some mild activity had a few petit mal seizures- started on Lamictal 100mg  BID has not noticed any further episodes since starting the medication.    She has a diagnosis of anxiety and is currently on wellbutrin 150 mg and PRN xanax.  She reports currently takes xanax rarely, takes 1/2 tab just a few days per month but feels that it is too strong  so does not want to take. Xanax last filled 08/11/22 #30 no refills.   BMI is Body mass index is 24.28 kg/m., she has been working on diet and exercise.  Doing new nutrition regimen.She is working on exercise.She is starting back at doing more weights.  Wt Readings from Last 3 Encounters:  10/04/22 164 lb 6.4 oz (74.6 kg)  07/16/22 160 lb 6.4 oz (72.8 kg)  07/05/22 160 lb 8 oz (72.8 kg)   Her blood pressure has been controlled at home 110/70's, currently on verapamil 240 mg every day. Today their BP is BP: 132/80 BP Readings from Last 3 Encounters:  10/04/22 132/80  07/16/22 122/76  07/05/22 135/89  She does not workout. She denies any cardiac symptoms, chest pains, palpitations, shortness of breath, dizziness or lower extremity edema.    She is on cholesterol medication Rosuvastatin 40 mg every other day( may need to return to daily), fenofibrate 134 mg daily and denies myalgias. Her cholesterol is at goal. The cholesterol last visit was:   Lab Results  Component Value Date   CHOL 149 05/26/2022   HDL 66 05/26/2022   LDLCALC 64 05/26/2022   TRIG 104 05/26/2022   CHOLHDL 2.3 05/26/2022    She has not been working on diet and exercise for prediabetes (5.8%, 2008), and denies hyperglycemia, hypoglycemia , nausea, polydipsia and polyuria. Last A1C in the office was:  Lab Results  Component Value Date   HGBA1C 5.8 (H) 05/26/2022   Patient has been on Thyroid Supression since Thyroid surgery for benign nodule and goiter in 2007.  She is currently taking levothyroxine 88 mcg daily.   Her medication was not changed last visit.   Lab Results  Component Value Date   TSH 0.77 05/26/2022   Patient is on Vitamin D supplement, has cut back from 50,000 once a week. Lab Results  Component Value Date   VD25OH 91 05/26/2022        Current Medications:   Current Outpatient Medications (Endocrine & Metabolic):    levothyroxine (SYNTHROID) 88 MCG tablet, TAKE 1 TABLET DAILY ON AN EMPTY STOMACH WITH ONLY WATER FOR 30 MINS& NO ANTACID MEDS, CALCIUM, OR MAGNESIUM FOR 4 HOURS. AVOID BIOTIN  Current Outpatient Medications (Cardiovascular):     fenofibrate micronized (LOFIBRA) 134 MG capsule, TAKE 1 CAPSULE DAILY FOR TRIGLYCERIDES (BLOOD FATS)   verapamil (CALAN-SR) 240 MG CR tablet, TAKE 1 TABLET DAILY WITH FOOD FOR BLOOD PRESSURE & MIGRAINE PREVENTION   rosuvastatin (CRESTOR) 40 MG tablet, TAKE 1 TABLET BY MOUTH EVERY OTHER DAY FOR CHOLESTEROL  Current Outpatient Medications (Respiratory):    albuterol (PROVENTIL HFA) 108 (90 Base) MCG/ACT inhaler, 1 to 2 inhalations 10-15 minutes apart every 4 hours if needed for asthma rescue   cetirizine (ZYRTEC) 10 MG tablet, Take 10 mg by mouth daily.   montelukast (SINGULAIR) 10 MG tablet, TAKE 1 TABLET BY MOUTH AT BEDTIME FOR ALLERGIES   fluticasone furoate-vilanterol (BREO ELLIPTA) 100-25 MCG/ACT AEPB, Use  1 Inhalation Daily  Current Outpatient Medications (Analgesics):    butalbital-acetaminophen-caffeine (FIORICET) 50-325-40 MG tablet, TAKE 1 TABLET BY MOUTH DAILY AS NEEDED FOR HEADACHE. DO NOT REFILL IN LESS THAN 30 DAYS,   UBRELVY 100 MG TABS, TAKE 1 TABLET BY MOUTH AS NEEDED AS DIRECTED  Current Outpatient Medications (Hematological):    vitamin B-12 (CYANOCOBALAMIN) 1000 MCG tablet, Take 1,000 mcg by mouth daily.  Current Outpatient Medications (Other):    ALPRAZolam (XANAX) 0.5 MG tablet, TAKE 1/2-1 TABLET AT BEDTIME IF NEEDED FOR SLEEP&LIMIT  TO 5 DAYS/WEEK TO AVOID ADDICTION&DEMENTIA Strength: 0.5 mg   amphetamine-dextroamphetamine (ADDERALL) 10 MG tablet, Take  1/2 - 1 tablet  1 or 2 x /day  for ADD   botulinum toxin Type A (BOTOX) 100 units SOLR injection, Inject 300 Units into the muscle every 3 (three) months.   buPROPion (WELLBUTRIN XL) 150 MG 24 hr tablet, TAKE 1 TABLET EVERY MORNING FOR MOOD. FOCUS & CONCENTRATION   Estradiol 10 MCG TABS vaginal tablet, Place 10 mcg vaginally 2 (two) times a week.   hydrocortisone (ANUSOL-HC) 2.5 % rectal cream, Place rectally 2 (two) times daily.   lamoTRIgine (LAMICTAL) 100 MG tablet, Take 1 tablet (100 mg total) by mouth 2 (two) times  daily.   magnesium oxide (MAG-OX) 400 MG tablet, Take 400 mg by mouth 2 (two) times daily.    omeprazole (PRILOSEC) 40 MG capsule, TAKE 1 CAPSULE DAILY TO PREVENT HEARTBURN & INDIGESTION / PATIENT KNOWS TO TAKE BY MOUTH   ondansetron (ZOFRAN-ODT) 8 MG disintegrating tablet, DISSOLVE 1 TABLET UNDER TONGUE EVERY 6 TO 8 HOURS FOR NAUSEA OR VOMITTING   Riboflavin 100 MG TABS, Take 100 mg by mouth. bid   Vitamin D, Ergocalciferol, (DRISDOL) 1.25 MG (50000 UNIT) CAPS capsule, Take 1 capsule three days a week for Vitamin D deficiency.   zinc gluconate 50 MG tablet, Take 50 mg by mouth daily.  Allergies:  Allergies  Allergen Reactions   Penicillins Anaphylaxis and Hives   Imitrex [Sumatriptan]    Kiwi Extract Swelling   Other Swelling    kiwi   Sulfa Antibiotics Hives   Zomig [Zolmitriptan]      Medical History:  Past Medical History:  Diagnosis Date   Anemia    Anxiety    Asthma    Chronic heartburn    Depression    Elevated cholesterol    on Crestor   Headache(784.0)    Hypertension    Hypothyroidism    S/P thyroidectomy   Migraine    Occlusion and stenosis of carotid artery with cerebral infarction    Spasm of muscle    Unspecified cerebral artery occlusion with cerebral infarction    Vitamin D deficiency    Allergies Allergies  Allergen Reactions   Penicillins Anaphylaxis and Hives   Imitrex [Sumatriptan]    Kiwi Extract Swelling   Other Swelling    kiwi   Sulfa Antibiotics Hives   Zomig [Zolmitriptan]     SURGICAL HISTORY She  has a past surgical history that includes Vaginal hysterectomy; Nasal sinus surgery; Cystocele repair (N/A, 01/08/2013); Cosmetic surgery; and Augmentation mammaplasty (Bilateral, 2000).   FAMILY HISTORY Her family history includes Heart disease in her father and mother; Parkinson's disease in her father; Peptic Ulcer Disease in her mother.   SOCIAL HISTORY She  reports that she has never smoked. She has never used smokeless tobacco. She  reports current alcohol use. She reports that she does not use drugs.   Screening Tests: Immunization History  Administered Date(s) Administered   Influenza Inj Mdck Quad With Preservative 01/20/2017, 01/09/2018, 01/10/2019, 01/25/2020   Influenza Split 01/24/2014   Influenza, Seasonal, Injecte, Preservative Fre 02/09/2016   Influenza,inj,Quad PF,6+ Mos 03/10/2021, 02/08/2022   Influenza-Unspecified 11/28/2011   PFIZER(Purple Top)SARS-COV-2 Vaccination 06/11/2019, 07/04/2019   PPD Test 04/04/2013, 04/22/2015, 05/18/2016, 06/20/2017, 07/27/2018, 11/03/2020, 11/03/2021   Pneumococcal Polysaccharide-23 03/29/1998   Td 03/29/2002   Tdap 05/18/2016     Review of Systems:  Review of Systems  Constitutional:  Negative for chills, fever and weight loss.  HENT:  Negative for congestion and hearing loss.   Eyes:  Negative for blurred vision and double vision.  Respiratory:  Negative for cough and shortness of breath.   Cardiovascular:  Negative for chest pain, palpitations, orthopnea and leg swelling.  Gastrointestinal:  Negative for abdominal pain, constipation, diarrhea, heartburn, nausea and vomiting.  Musculoskeletal:  Negative for falls, joint pain and myalgias.  Skin:  Negative for rash.  Neurological:  Negative for dizziness, tingling, tremors, loss of consciousness and headaches.  Psychiatric/Behavioral:  Negative for depression, memory loss and suicidal ideas.      Physical Exam: BP 132/80   Pulse 97   Temp 97.7 F (36.5 C)   Ht 5\' 9"  (1.753 m)   Wt 164 lb 6.4 oz (74.6 kg)   SpO2 95%   BMI 24.28 kg/m  Wt Readings from Last 3 Encounters:  10/04/22 164 lb 6.4 oz (74.6 kg)  07/16/22 160 lb 6.4 oz (72.8 kg)  07/05/22 160 lb 8 oz (72.8 kg)    General Appearance: Well nourished, in no apparent distress. Eyes: PERRLA, EOMs, conjunctiva no swelling or erythema Sinuses: No Frontal/maxillary tenderness ENT/Mouth: Ext aud canals clear, TMs without erythema, bulging. No  erythema, swelling, or exudate on post pharynx.  Tonsils not swollen or erythematous. Hearing normal.  Neck: Supple, thyroid normal.  Respiratory: Respiratory effort normal, BS equal bilaterally without rales, rhonchi, wheezing or stridor.  Cardio: RRR with no MRGs. Brisk peripheral pulses without edema.  Abdomen: Soft, + BS.  Non tender, no guarding, rebound, hernias, masses. Lymphatics: Non tender without lymphadenopathy.  Musculoskeletal: Full ROM, 5/5 strength right, 4/5 strength on left. normal gait, slow. Skin: Warm, dry without rashes, lesions, ecchymosis.  Neuro: Cranial nerves intact. No cerebellar symptoms.  Psych: Awake and oriented X 3, normal affect, Insight and Judgment appropriate.    Raynelle Dick, NP 12:37 PM [facility]

## 2022-09-28 ENCOUNTER — Other Ambulatory Visit: Payer: Self-pay | Admitting: Nurse Practitioner

## 2022-09-28 DIAGNOSIS — F988 Other specified behavioral and emotional disorders with onset usually occurring in childhood and adolescence: Secondary | ICD-10-CM

## 2022-09-28 MED ORDER — AMPHETAMINE-DEXTROAMPHETAMINE 10 MG PO TABS
ORAL_TABLET | ORAL | 0 refills | Status: DC
Start: 2022-09-28 — End: 2022-11-11

## 2022-10-04 ENCOUNTER — Encounter: Payer: Self-pay | Admitting: Nurse Practitioner

## 2022-10-04 ENCOUNTER — Ambulatory Visit (INDEPENDENT_AMBULATORY_CARE_PROVIDER_SITE_OTHER): Payer: Medicare Other | Admitting: Nurse Practitioner

## 2022-10-04 VITALS — BP 132/80 | HR 97 | Temp 97.7°F | Ht 69.0 in | Wt 164.4 lb

## 2022-10-04 DIAGNOSIS — Z79899 Other long term (current) drug therapy: Secondary | ICD-10-CM | POA: Diagnosis not present

## 2022-10-04 DIAGNOSIS — E039 Hypothyroidism, unspecified: Secondary | ICD-10-CM | POA: Diagnosis not present

## 2022-10-04 DIAGNOSIS — I10 Essential (primary) hypertension: Secondary | ICD-10-CM | POA: Diagnosis not present

## 2022-10-04 DIAGNOSIS — J4520 Mild intermittent asthma, uncomplicated: Secondary | ICD-10-CM

## 2022-10-04 DIAGNOSIS — E782 Mixed hyperlipidemia: Secondary | ICD-10-CM | POA: Diagnosis not present

## 2022-10-04 DIAGNOSIS — G43709 Chronic migraine without aura, not intractable, without status migrainosus: Secondary | ICD-10-CM | POA: Diagnosis not present

## 2022-10-04 DIAGNOSIS — I69954 Hemiplegia and hemiparesis following unspecified cerebrovascular disease affecting left non-dominant side: Secondary | ICD-10-CM | POA: Diagnosis not present

## 2022-10-04 DIAGNOSIS — E559 Vitamin D deficiency, unspecified: Secondary | ICD-10-CM

## 2022-10-04 DIAGNOSIS — F988 Other specified behavioral and emotional disorders with onset usually occurring in childhood and adolescence: Secondary | ICD-10-CM

## 2022-10-04 DIAGNOSIS — R7309 Other abnormal glucose: Secondary | ICD-10-CM | POA: Diagnosis not present

## 2022-10-04 DIAGNOSIS — F419 Anxiety disorder, unspecified: Secondary | ICD-10-CM

## 2022-10-04 LAB — CBC WITH DIFFERENTIAL/PLATELET
Basophils Relative: 0.5 %
Eosinophils Absolute: 251 cells/uL (ref 15–500)
MCV: 91.9 fL (ref 80.0–100.0)
MPV: 11 fL (ref 7.5–12.5)
Monocytes Relative: 7.4 %
Neutrophils Relative %: 51.8 %
RBC: 4.54 10*6/uL (ref 3.80–5.10)

## 2022-10-04 MED ORDER — ROSUVASTATIN CALCIUM 40 MG PO TABS
ORAL_TABLET | ORAL | 3 refills | Status: AC
Start: 2022-10-04 — End: ?

## 2022-10-04 MED ORDER — FLUTICASONE FUROATE-VILANTEROL 100-25 MCG/ACT IN AEPB
INHALATION_SPRAY | RESPIRATORY_TRACT | 3 refills | Status: DC
Start: 2022-10-04 — End: 2023-10-14

## 2022-10-04 NOTE — Patient Instructions (Signed)

## 2022-10-05 LAB — COMPLETE METABOLIC PANEL WITH GFR
AG Ratio: 2 (calc) (ref 1.0–2.5)
ALT: 19 U/L (ref 6–29)
AST: 14 U/L (ref 10–35)
Albumin: 4.5 g/dL (ref 3.6–5.1)
Alkaline phosphatase (APISO): 62 U/L (ref 37–153)
BUN: 25 mg/dL (ref 7–25)
CO2: 28 mmol/L (ref 20–32)
Calcium: 9.7 mg/dL (ref 8.6–10.4)
Chloride: 107 mmol/L (ref 98–110)
Creat: 0.9 mg/dL (ref 0.50–1.05)
Globulin: 2.2 g/dL (calc) (ref 1.9–3.7)
Glucose, Bld: 98 mg/dL (ref 65–99)
Potassium: 4.2 mmol/L (ref 3.5–5.3)
Sodium: 142 mmol/L (ref 135–146)
Total Bilirubin: 0.4 mg/dL (ref 0.2–1.2)
Total Protein: 6.7 g/dL (ref 6.1–8.1)
eGFR: 72 mL/min/{1.73_m2} (ref >=60)

## 2022-10-05 LAB — CBC WITH DIFFERENTIAL/PLATELET
Absolute Monocytes: 281 cells/uL (ref 200–950)
Basophils Absolute: 19 cells/uL (ref 0–200)
Eosinophils Relative: 6.6 %
HCT: 41.7 % (ref 35.0–45.0)
Hemoglobin: 14.4 g/dL (ref 11.7–15.5)
Lymphs Abs: 1281 cells/uL (ref 850–3900)
MCH: 31.7 pg (ref 27.0–33.0)
MCHC: 34.5 g/dL (ref 32.0–36.0)
Neutro Abs: 1968 cells/uL (ref 1500–7800)
Platelets: 206 10*3/uL (ref 140–400)
RDW: 12.5 % (ref 11.0–15.0)
Total Lymphocyte: 33.7 %
WBC: 3.8 10*3/uL (ref 3.8–10.8)

## 2022-10-05 LAB — LIPID PANEL
Cholesterol: 152 mg/dL (ref <200)
HDL: 59 mg/dL (ref >=50)
LDL Cholesterol (Calc): 72 mg/dL (calc)
Non-HDL Cholesterol (Calc): 93 mg/dL (calc) (ref <130)
Total CHOL/HDL Ratio: 2.6 (calc) (ref <5.0)
Triglycerides: 133 mg/dL (ref <150)

## 2022-10-05 LAB — TSH: TSH: 1.27 mIU/L (ref 0.40–4.50)

## 2022-10-07 ENCOUNTER — Ambulatory Visit: Payer: Medicare Other | Admitting: Neurology

## 2022-10-18 ENCOUNTER — Other Ambulatory Visit: Payer: Self-pay | Admitting: Internal Medicine

## 2022-10-18 ENCOUNTER — Encounter: Payer: Self-pay | Admitting: Internal Medicine

## 2022-10-18 MED ORDER — DEXAMETHASONE 4 MG PO TABS: ORAL_TABLET | ORAL | 0 refills | Status: DC

## 2022-10-24 ENCOUNTER — Other Ambulatory Visit: Payer: Self-pay | Admitting: Neurology

## 2022-10-26 ENCOUNTER — Telehealth: Payer: Self-pay | Admitting: Nurse Practitioner

## 2022-10-26 NOTE — Telephone Encounter (Signed)
Please fax her last labs to Dr. Eliane Decree office

## 2022-10-26 NOTE — Telephone Encounter (Signed)
Patient has an appointment with Dr. Allena Katz on Thursday August 1st and would like the most recent labs done at her last visit with you, faxed to him before her appointment to ensure that they do not duplicate any tests.

## 2022-10-26 NOTE — Telephone Encounter (Signed)
Requested Prescriptions   Pending Prescriptions Disp Refills   butalbital-acetaminophen-caffeine (FIORICET) 50-325-40 MG tablet [Pharmacy Med Name: BUTALB-ACETAMIN-CAFF 50-325-40] 12 tablet 4    Sig: TAKE 1 TABLET BY MOUTH DAILY AS NEEDED FOR HEADACHE. DO NOT REFILL IN LESS THAN 30 DAYS,   Last seen 07/05/22, next appt scheduled 11/24/22   Dispenses  Should not be due until 11/14/22 Dispensed Days Supply Quantity Provider Pharmacy  BUTALB-ACETAMIN-CAFF 50-325-40 10/14/2022 30 12 each Windell Norfolk, MD [facility] [phone] - S...  BUTALB-ACETAMIN-CAFF 50-325-40 09/15/2022 30 12 each Windell Norfolk, MD [facility] [phone] - S...  BUTALB-ACETAMIN-CAFF 50-325-40 08/20/2022 30 12 each Windell Norfolk, MD [facility] [phone] - S...  BUTALB-ACETAMIN-CAFF 50-325-40 07/24/2022 30 12 each Windell Norfolk, MD [facility] [phone] - S...  BUTALB-ACETAMIN-CAFF 50-325-40 06/24/2022 30 12 each Windell Norfolk, MD [facility] [phone] - S...  BUTALB-ACETAMIN-CAFF [id number] 06/01/2022 12 12 each Levert Feinstein, MD [facility] [phone] - S...  BUTALB-ACETAMIN-CAFF [id number] 04/28/2022 12 12 each Levert Feinstein, MD [facility] [phone] - S...  BUTALB-ACETAMIN-CAFF [id number] 02/16/2022 12 12 each Levert Feinstein, MD [facility] [phone] - S...  BUTALB-ACETAMIN-CAFF [id number] 01/13/2022 12 12 each Levert Feinstein, MD [facility] [phone] - S...  BUTALB-ACETAMIN-CAFF [id number] 12/14/2021 12 12 each Levert Feinstein, MD [facility] [phone] - S...  BUTALB-ACETAMIN-CAFF [id number] 11/14/2021 30 12 each Levert Feinstein, MD [facility] [phone] - S...    Refusing refill until 11/14/22

## 2022-11-08 ENCOUNTER — Encounter: Payer: Medicare Other | Admitting: Internal Medicine

## 2022-11-08 ENCOUNTER — Other Ambulatory Visit: Payer: Self-pay | Admitting: Internal Medicine

## 2022-11-08 MED ORDER — TRAZODONE HCL 150 MG PO TABS: ORAL_TABLET | ORAL | 1 refills | Status: DC

## 2022-11-10 ENCOUNTER — Other Ambulatory Visit: Payer: Self-pay | Admitting: Neurology

## 2022-11-10 ENCOUNTER — Other Ambulatory Visit: Payer: Self-pay | Admitting: Nurse Practitioner

## 2022-11-10 ENCOUNTER — Other Ambulatory Visit: Payer: Self-pay | Admitting: Orthopaedic Surgery

## 2022-11-10 DIAGNOSIS — E782 Mixed hyperlipidemia: Secondary | ICD-10-CM

## 2022-11-10 NOTE — Telephone Encounter (Signed)
BUTALB-ACETAMIN-CAFF 50-325-40 10/14/2022 30 12 each Windell Norfolk, MD [facility] [phone] - S...  BUTALB-ACETAMIN-CAFF 50-325-40 09/15/2022 30 12 each Windell Norfolk, MD [facility] [phone] - S...  BUTALB-ACETAMIN-CAFF 50-325-40 08/20/2022 30 12 each Windell Norfolk, MD [facility] [phone] - S...  BUTALB-ACETAMIN-CAFF 50-325-40 07/24/2022 30 12 each Windell Norfolk, MD [facility] [phone] - S...  BUTALB-ACETAMIN-CAFF 50-325-40 06/24/2022 30 12 each Windell Norfolk, MD [facility] [phone] - S...  BUTALB-ACETAMIN-CAFF [id number] 06/01/2022 12 12 each Levert Feinstein, MD [facility] [phone] - S...  BUTALB-ACETAMIN-CAFF [id number] 04/28/2022 12 12 each Levert Feinstein, MD [facility] [phone] - S...  BUTALB-ACETAMIN-CAFF [id number] 02/16/2022 12 12 each Levert Feinstein, MD [facility] [phone] - S...  BUTALB-ACETAMIN-CAFF [id number] 01/13/2022 12 12 each Levert Feinstein, MD [facility] [phone] - S...  BUTALB-ACETAMIN-CAFF [id number] 12/14/2021 12 12 each Levert Feinstein, MD [facility] [phone] - S...  BUTALB-ACETAMIN-CAFF 06-301-60 11/14/2021 30 12 each Levert Feinstein, MD [facility] [phone] - S...       Last visit 07/05/22 Next visit 11/24/22

## 2022-11-10 NOTE — Progress Notes (Signed)
Future Appointments  Date Time Provider Department  11/11/2022 10:30 AM Lucky Cowboy, MD [facility]  11/24/2022 11:00 AM Levert Feinstein, MD [facility]  01/21/2023 10:00 AM Lucky Cowboy, MD [facility]    History of Present Illness:       This very nice 62 y.o.  MWF  with HTN, HLD, Pre-Diabetes, Vitamin D Deficiency & GERD presents with c/o  "on-going stomach issues".  She has been on Omeprazole for  dyspepsia.  She reports intermittent epigastric "burning" type dyspepsia. She also recounts upcoming surgery for bladder prolapse.                                                                   Also discussed insomnia / sleeping issues and intention to replace Alprazolam with Trazodone due to the high addiction potential with Alprazolam.    Current Outpatient Medications on File Prior to Visit  Medication Sig   albuterol  HFA  inhaler 1 to 2 inhalations 10-15 minutes apart every 4 hours if needed f   ALPRAZolam (XANAX) 0.5 MG tablet TAKE 1/2-1 TAB AT BEDTIME IF NEEDED   ADDERALL 10 MG tablet Take  1/2 - 1 tablet  1 or 2 x /day  for ADD   BOTOX 100 units S injection Inject 300 Units into the muscle every 3 months.   buPROPion XL  150 MG 24 hr tablet TAKE 1 TABLET EVERY MORNING   FIORICET TAKE 1 TABLET  DAILY AS NEEDED    cetirizine  10 MG tablet Take 10 mg by mouth daily.   Estradiol 10 MCG TABS vaginal tablet Place 10 mcg vaginally 2  times a week.   fenofibrate m 134 MG capsule TAKE 1 CAPSULE DAILY    BREO ELLIPTA 100-25  Use  1 Inhalation Daily   ANUSOL-HC)2.5 % rectal cream Place rectally 2 (two) times daily.   LAMICTAL 100 MG tablet Take 1 tablet 2 (two) times daily.   levothyroxine  88 MCG tablet TAKE 1 TABLET DAILY    magnesium 400 MG tablet Take  2 times daily.    montelukast  10 MG tablet TAKE 1 TABLET  AT BEDTIME    omeprazole 40 MG capsule TAKE 1 CAPSULE DAILY    ondansetron - ODT 8 MG d 1 TABLET SL EVERY 6-8 HOURS    Riboflavin 100 MG TABS Take  bid   rosuvastatin  (40 MG tablet TAKE 1 TABLET  EVERY OTHER DAY    traZODone (150 MG tablet Take  1/2 to 1 tab to 2 hrs before Bedtime (Replaces Xanax )   UBRELVY 100 MG TABS TAKE 1 TABLET AS NEEDED    verapamil-SR 240 MG C TAKE 1 TABLET DAILY    vitamin B-12  1000 MCG tablet Take  daily.   Vitamin D   50,000 u capsule Take 1 capsule three days a week for Vitamin D deficiency.   zinc 50 MG tablet Take  daily.     Allergies  Allergen Reactions   Penicillins Anaphylaxis and Hives   Imitrex [Sumatriptan]    Kiwi Extract Swelling   Other Swelling    kiwi   Sulfa Antibiotics Hives   Zomig [Zolmitriptan]      Problem list She has History of CVA (cerebrovascular accident); Migraine; Spastic  hemiplegia affecting left nondominant side (HCC); Hyperlipidemia, mixed; Sinusitis, chronic; Vitamin D deficiency; Medication management; History of prediabetes; ADD (attention deficit disorder) without hyperactivity; Hypothyroidism; GERD (gastroesophageal reflux disease); Anxiety; Fatty liver; Abnormal glucose; Labile hypertension; Spastic hemiplegia of left nondominant side as late effect of cerebrovascular disease (HCC); Chronic migraine w/o aura w/o status migrainosus, not intractable; B12 deficiency; Partial symptomatic epilepsy with complex partial seizures, not intractable, without status epilepticus (HCC); Arthritis of carpometacarpal (CMC) joint of right thumb; Degenerative arthritis of distal interphalangeal joint of index finger of right hand; Degenerative arthritis of distal interphalangeal joint of middle finger of right hand; Degenerative arthritis of distal interphalangeal joint of ring finger of right hand; Squamous cell carcinoma in situ (SCCIS) of dorsum of left hand; Closed displaced fracture of proximal phalanx of lesser toe, initial encounter; Gait abnormality; Cerebrovascular accident (CVA) (HCC); and Muscle weakness (generalized) on their problem list.   Observations/Objective:  BP (!) 132/90   Pulse 97    Temp 97.9 F (36.6 C)   Resp 17   Ht 5\' 9"  (1.753 m)   Wt 168 lb 6.4 oz (76.4 kg)   SpO2 96%   BMI 24.87 kg/m   HEENT - WNL. Neck - supple.  Chest - Clear equal BS. Cor - Nl HS. RRR w/o sig MGR. PP 1(+). No edema. MS- FROM w/o deformities.  Gait Nl. Neuro -  Nl w/o focal abnormalities.   Assessment and Plan:   1. Primary insomnia  - traZODone (DESYREL) 150 MG tablet;  Take  1/2 to 1 tablet   1 to 2 hours   before Bedtime I f Needed forSleep  ( Replaces Xanax )   Dispense: 90 tablet; Refill: 1   Follow Up Instructions:        I discussed the assessment and treatment plan with the patient. The patient was provided an opportunity to ask questions and all were answered. The patient agreed with the plan and demonstrated an understanding of the instructions.       The patient was advised to call back or seek an in-person evaluation if the symptoms worsen or if the condition fails to improve as anticipated.    Marinus Maw, MD

## 2022-11-11 ENCOUNTER — Other Ambulatory Visit: Payer: Self-pay | Admitting: Nurse Practitioner

## 2022-11-11 ENCOUNTER — Ambulatory Visit: Payer: Medicare Other | Admitting: Internal Medicine

## 2022-11-11 ENCOUNTER — Encounter: Payer: Self-pay | Admitting: Internal Medicine

## 2022-11-11 VITALS — BP 132/90 | HR 97 | Temp 97.9°F | Resp 17 | Ht 69.0 in | Wt 168.4 lb

## 2022-11-11 DIAGNOSIS — F5101 Primary insomnia: Secondary | ICD-10-CM | POA: Diagnosis not present

## 2022-11-11 DIAGNOSIS — F988 Other specified behavioral and emotional disorders with onset usually occurring in childhood and adolescence: Secondary | ICD-10-CM

## 2022-11-11 DIAGNOSIS — F419 Anxiety disorder, unspecified: Secondary | ICD-10-CM

## 2022-11-11 MED ORDER — TRAZODONE HCL 150 MG PO TABS
ORAL_TABLET | ORAL | 1 refills | Status: DC
Start: 2022-11-11 — End: 2023-02-08

## 2022-11-11 MED ORDER — AMPHETAMINE-DEXTROAMPHETAMINE 10 MG PO TABS
ORAL_TABLET | ORAL | 0 refills | Status: DC
Start: 2022-11-11 — End: 2023-06-01

## 2022-11-13 ENCOUNTER — Other Ambulatory Visit: Payer: Self-pay | Admitting: Nurse Practitioner

## 2022-11-13 DIAGNOSIS — F419 Anxiety disorder, unspecified: Secondary | ICD-10-CM

## 2022-11-16 ENCOUNTER — Ambulatory Visit: Payer: Medicare Other | Admitting: Internal Medicine

## 2022-11-16 DIAGNOSIS — Z79899 Other long term (current) drug therapy: Secondary | ICD-10-CM | POA: Diagnosis not present

## 2022-11-16 DIAGNOSIS — N8110 Cystocele, unspecified: Secondary | ICD-10-CM | POA: Diagnosis not present

## 2022-11-16 DIAGNOSIS — E785 Hyperlipidemia, unspecified: Secondary | ICD-10-CM | POA: Diagnosis not present

## 2022-11-16 DIAGNOSIS — Z7982 Long term (current) use of aspirin: Secondary | ICD-10-CM | POA: Diagnosis not present

## 2022-11-16 DIAGNOSIS — K660 Peritoneal adhesions (postprocedural) (postinfection): Secondary | ICD-10-CM | POA: Diagnosis not present

## 2022-11-16 DIAGNOSIS — Z8673 Personal history of transient ischemic attack (TIA), and cerebral infarction without residual deficits: Secondary | ICD-10-CM | POA: Diagnosis not present

## 2022-11-16 DIAGNOSIS — K649 Unspecified hemorrhoids: Secondary | ICD-10-CM | POA: Diagnosis not present

## 2022-11-16 DIAGNOSIS — I10 Essential (primary) hypertension: Secondary | ICD-10-CM | POA: Diagnosis not present

## 2022-11-22 ENCOUNTER — Telehealth: Payer: Self-pay | Admitting: Neurology

## 2022-11-22 DIAGNOSIS — E559 Vitamin D deficiency, unspecified: Secondary | ICD-10-CM | POA: Diagnosis not present

## 2022-11-22 DIAGNOSIS — D631 Anemia in chronic kidney disease: Secondary | ICD-10-CM | POA: Diagnosis not present

## 2022-11-22 DIAGNOSIS — N182 Chronic kidney disease, stage 2 (mild): Secondary | ICD-10-CM | POA: Diagnosis not present

## 2022-11-22 DIAGNOSIS — I129 Hypertensive chronic kidney disease with stage 1 through stage 4 chronic kidney disease, or unspecified chronic kidney disease: Secondary | ICD-10-CM | POA: Diagnosis not present

## 2022-11-22 NOTE — Telephone Encounter (Signed)
Dr. Terrace Arabia,  Would this surgery delay the pt in getting botox shot? Thanks,  Production assistant, radio

## 2022-11-22 NOTE — Telephone Encounter (Signed)
Pt said had robotic surgery on 11/16/22 for mesh to hold bladder. Want to make sure everything will be ok to get the Botox shot. Would like a call back.

## 2022-11-22 NOTE — Telephone Encounter (Signed)
Last injection was on April 8th, on schedule for August 28th, it is OK for her to come in for injection

## 2022-11-24 ENCOUNTER — Ambulatory Visit: Payer: Medicare Other | Admitting: Neurology

## 2022-11-24 VITALS — BP 148/95 | HR 103

## 2022-11-24 DIAGNOSIS — G43709 Chronic migraine without aura, not intractable, without status migrainosus: Secondary | ICD-10-CM | POA: Diagnosis not present

## 2022-11-24 DIAGNOSIS — Z8673 Personal history of transient ischemic attack (TIA), and cerebral infarction without residual deficits: Secondary | ICD-10-CM

## 2022-11-24 DIAGNOSIS — I69954 Hemiplegia and hemiparesis following unspecified cerebrovascular disease affecting left non-dominant side: Secondary | ICD-10-CM

## 2022-11-24 MED ORDER — ONABOTULINUMTOXINA 100 UNITS IJ SOLR
300.0000 [IU] | Freq: Once | INTRAMUSCULAR | Status: AC
Start: 2022-11-24 — End: 2022-11-24
  Administered 2022-11-24: 300 [IU] via INTRAMUSCULAR

## 2022-11-24 NOTE — Progress Notes (Signed)
Botox- 100 units x 3 vials Lot: [id number] Expiration: 2026/11 NDC: 9562-1308-65  Bacteriostatic 0.9% Sodium Chloride- 2mL  [id number] Expiration: 06/28/23  Dx: M84.132 . G43.709  Z86.73  B/B Witnessed by [employer] RN

## 2022-11-24 NOTE — Progress Notes (Signed)
  Spastic left hemiparesis following right internal carotid artery dissection, right MCA stroke in June 2000   Botulism toxin injection every 3 months for spastic left  hemiparesis injection  Under EMG,  injected Botox A 300 units  Left pronator teres 25 units Left palmaris longus 25 units  Left pectoralis major 50 units Left latissimus dorsii 50 units  Left flexor digitorum longus 50 units Left tibialis posterior 50 units  Bilateral temporal region, 5 units at each injection site,x5 sites at each side (used 50 units total as migraine prevention)     Levert Feinstein, M.D. Ph.D.  [facility] [address] [place], [place] [address] Ph: [phone] Fax: [phone]

## 2022-12-21 ENCOUNTER — Encounter: Payer: Medicare Other | Admitting: Internal Medicine

## 2022-12-29 DIAGNOSIS — N8110 Cystocele, unspecified: Secondary | ICD-10-CM | POA: Diagnosis not present

## 2023-01-03 DIAGNOSIS — Z0289 Encounter for other administrative examinations: Secondary | ICD-10-CM

## 2023-01-04 ENCOUNTER — Telehealth: Payer: Self-pay

## 2023-01-04 NOTE — Telephone Encounter (Signed)
Patient form completed, placed in MD office for review and signature

## 2023-01-05 ENCOUNTER — Ambulatory Visit: Payer: Medicare Other | Admitting: Neurology

## 2023-01-06 DIAGNOSIS — L719 Rosacea, unspecified: Secondary | ICD-10-CM | POA: Diagnosis not present

## 2023-01-06 DIAGNOSIS — D485 Neoplasm of uncertain behavior of skin: Secondary | ICD-10-CM | POA: Diagnosis not present

## 2023-01-06 DIAGNOSIS — D2239 Melanocytic nevi of other parts of face: Secondary | ICD-10-CM | POA: Diagnosis not present

## 2023-01-11 ENCOUNTER — Telehealth: Payer: Self-pay | Admitting: *Deleted

## 2023-01-11 NOTE — Telephone Encounter (Signed)
Pt [employer] form ready for p/u @ the front desk

## 2023-01-20 NOTE — Progress Notes (Signed)
Annual Screening/Preventative Visit & Comprehensive Evaluation &  Examination   Future Appointments  Date Time Provider Department  01/21/2023 10:00 AM Lucky Cowboy, MD [facility]  02/16/2023  2:00 PM Levert Feinstein, MD [facility]  02/01/2024 10:00 AM Lucky Cowboy, MD [facility]        This very nice 62 y.o. MWF with HTN, HLD, Prediabetes  and Vitamin D Deficiency presents for a Screening /Preventative Visit & comprehensive evaluation and management of multiple medical co-morbidities.   Patient also has hx/o ADD .        Patient is followed expectantly for labile HTN predates since 2009 .  At that time she presented with a Rt MCA CVA due to a Right Carotid Artery dissection attributed to Fibromuscular Dysplasia. Patient recovered with mild residual Lt spastic hemi-paresis. Dr Terrace Arabia follows the patient for Botox inj for her spasticity & also for her migraines.  Patient's BP has been controlled at home and patient denies any cardiac symptoms as chest pain, palpitations, shortness of breath, dizziness or ankle swelling. Today's BP is at goal - 128/80   .         Patient's hyperlipidemia is  controlled with diet and Rosuvastatin /Fenofibrate.   Patient denies myalgias or other medication SE's. Last lipids were  at goal :  Lab Results  Component Value Date   CHOL 152 10/04/2022   HDL 59 10/04/2022   LDLCALC 72 10/04/2022   TRIG 133 10/04/2022   CHOLHDL 2.6 10/04/2022          In 2007, patient has subtotal Thyroidectomy for Goiter and was initiated on suppressive Replacement therapy.         Patient has hx/o prediabetes predating  (A1c 5.8% /2008) and patient denies reactive hypoglycemic symptoms, visual blurring, diabetic polys or paresthesias. Last A1c was near  goal :  Lab Results  Component Value Date   HGBA1C 5.8 (H) 05/26/2022         Finally, patient has history of Vitamin D Deficiency  ("23"/2008) and last Vitamin D was at goal :  Lab Results  Component Value Date    VD25OH 91 05/26/2022       Current Outpatient Medications  Medication Instructions   albuterol HFA  inhaler 1 to 2 inhalations  every 4 hours if needed    ADDERALL  10 MG tablet Take  1/2 - 1 tablet  1 or 2 x /day  for ADD   Botox  300 Units  - Intramuscular Every 3 months   buPROPion  XL 150 MG  TAKE 1 TABLET EVERY MORNING   FIORICET 1 tablet Daily PRN   cetirizine   10 mg Daily   VITAMIN B12   1,000 mcg  Daily   Estradiol   10 mcg, Vaginal 2 times weekly   fenofibrate 134 MG capsule TAKE 1 CAPSULE DAILY    BREO ELLIPTA 100-25  Use  1 Inhalation Daily   ANUSOL-HC 2.5 % rectal cream Rectal, 2 times daily   LAMICTAL  100 mg 2 times daily   levothyroxine 88 MCG tablet TAKE 1 TABLET DAILY    Magnesium  400 mg daily   montelukast 10 MG tablet TAKE 1 TABLET  AT BEDTIME    omeprazole 40 MG capsule TAKE 1 CAPSULE DAILY    ondansetron -ODT    8 MG  DISSOLVE 1 TABLET SL EVERY 6 TO 8 HOURS FOR NAUSEA OR VOMITTING   Riboflavin 100 mg   bid   rosuvastatin  40  MG tablet TAKE 1 TABLET  EVERY OTHER DAY    traZODone   150 MG tablet Take  1/2-1 tablet 1-2 hours before Bedtime    UBRELVY 100 MG TABS TAKE 1 TABLET  AS NEEDED   verapamil -SR 240 MG C TAKE 1 TABLET DAILY    Vitamin D  50,000 u Take 1 capsule three days a week    zinc   50 mg Daily      Allergies  Allergen Reactions   Penicillins Anaphylaxis and Hives   Imitrex [Sumatriptan]    Other Swelling    kiwi   Sulfa Antibiotics Hives   Zomig [Zolmitriptan]      Past Medical History:  Diagnosis Date   Anemia    Anxiety    Asthma    Chronic heartburn    Depression    Elevated cholesterol    on Crestor   Headache(784.0)    Hypertension    Hypothyroidism    S/P thyroidectomy   Migraine    Occlusion and stenosis of carotid artery with cerebral infarction    Spasm of muscle    Unspecified cerebral artery occlusion with cerebral infarction    Vitamin D deficiency      Health Maintenance  Topic Date Due   Zoster  Vaccines- Shingrix (1 of 2) Never done   Pneumococcal Vaccine 6-4 Years old (2 - PCV) 03/30/1999   PAP SMEAR-Modifier  05/07/2017   COVID-19 Vaccine (3 - Pfizer risk series) 08/01/2019   INFLUENZA VACCINE  10/27/2020   COLONOSCOPY  01/13/2021   MAMMOGRAM  01/03/2022   TETANUS/TDAP  05/18/2026   Hepatitis C Screening  Completed   HIV Screening  Completed   HPV VACCINES  Aged Out     Immunization History  Administered Date(s) Administered   Influenza Inj Mdck Quad  01/10/2019, 01/25/2020   Influenza Split 01/24/2014   Influenza, Seasonal 02/09/2016   Influenza 11/28/2011   PFIZER  SARS-COV-2 Vacc 06/11/2019, 07/04/2019   PPD Test 05/18/2016, 06/20/2017, 07/27/2018   Pneumococcal -23 03/29/1998   Td 03/29/2002   Tdap 05/18/2016    Last Colon -  01/14/2020 - Dr Kerin Salen cited poor colon prep &  recommended repeat in  6 months ~ April 2022 - (overdue)    Last MGM - 01/20/2021   Past Surgical History:  Procedure Laterality Date   AUGMENTATION MAMMAPLASTY Bilateral 2000   COSMETIC SURGERY     CYSTOCELE REPAIR N/A 01/08/2013   Procedure: ANTERIOR REPAIR (CYSTOCELE);  Surgeon: Meriel Pica, MD;  Location: [facility];  Service: Gynecology;  Laterality: N/A;   NASAL SINUS SURGERY     VAGINAL HYSTERECTOMY       Family History  Problem Relation Age of Onset   Heart disease Mother    Peptic Ulcer Disease Mother    Heart disease Father    Parkinson's disease Father      Social History   Tobacco Use   Smoking status: Never   Smokeless tobacco: Never  Substance Use Topics   Alcohol use: Yes    Comment: occassionally drinks a glass of wine   Drug use: No      ROS Constitutional: Denies fever, chills, weight loss/gain, headaches, insomnia,  night sweats, and change in appetite. Does c/o fatigue. Eyes: Denies redness, blurred vision, diplopia, discharge, itchy, watery eyes.  ENT: Denies discharge, congestion, post nasal drip, epistaxis, sore throat, earache,  hearing loss, dental pain, Tinnitus, Vertigo, Sinus pain, snoring.  Cardio: Denies chest pain, palpitations, irregular heartbeat, syncope,  dyspnea, diaphoresis, orthopnea, PND, claudication, edema Respiratory: denies cough, dyspnea, DOE, pleurisy, hoarseness, laryngitis, wheezing.  Gastrointestinal: Denies dysphagia, heartburn, reflux, water brash, pain, cramps, nausea, vomiting, bloating, diarrhea, constipation, hematemesis, melena, hematochezia, jaundice, hemorrhoids Genitourinary: Denies dysuria, frequency, urgency, nocturia, hesitancy, discharge, hematuria, flank pain Breast: Breast lumps, nipple discharge, bleeding.  Musculoskeletal: Denies arthralgia, myalgia, stiffness, Jt. Swelling, pain, limp, and strain/sprain. Denies falls. Skin: Denies puritis, rash, hives, warts, acne, eczema, changing in skin lesion Neuro: No weakness, tremor, incoordination, spasms, paresthesia, pain Psychiatric: Denies confusion, memory loss, sensory loss. Denies Depression. Endocrine: Denies change in weight, skin, hair change, nocturia, and paresthesia, diabetic polys, visual blurring, hyper / hypo glycemic episodes.  Heme/Lymph: No excessive bleeding, bruising, enlarged lymph nodes.  Physical Exam  BP 128/80   Pulse 97   Temp 97.9 F (36.6 C)   Resp 16   Ht 5\' 9"  (1.753 m)   Wt 166 lb 6.4 oz (75.5 kg)   SpO2 97%   BMI 24.57 kg/m   General Appearance: Well nourished, well groomed and in no apparent distress.  Eyes: PERRLA, EOMs, conjunctiva no swelling or erythema, normal fundi and vessels. Sinuses: No frontal/maxillary tenderness ENT/Mouth: EACs patent / TMs  nl. Nares clear without erythema, swelling, mucoid exudates. Oral hygiene is good. No erythema, swelling, or exudate. Tongue normal, non-obstructing. Tonsils not swollen or erythematous. Hearing normal.  Neck: Supple, thyroid not palpable. No bruits, nodes or JVD. Respiratory: Respiratory effort normal.  BS equal and clear bilateral without  rales, rhonci, wheezing or stridor. Cardio: Heart sounds are normal with regular rate and rhythm and no murmurs, rubs or gallops. Peripheral pulses are normal and equal bilaterally without edema. No aortic or femoral bruits. Chest: symmetric with normal excursions and percussion. Breasts: Deferred  to upcoming MGM .  Abdomen: Flat, soft with bowel sounds active. Nontender, no guarding, rebound, hernias, masses, or organomegaly.  Lymphatics: Non tender without lymphadenopathy.  Musculoskeletal: Full ROM all peripheral extremities, joint stability, 5/5 strength, and normal gait. Skin: Warm and dry without rashes, lesions, cyanosis, clubbing or  ecchymosis.  Neuro: Cranial nerves intact. Mild Lt hyperreflexia and increased L side tone Upper>Lower. No cerebellar symptoms. Sensation intact.  Pysch: Alert and oriented X 3, normal affect, Insight and Judgment appropriate.    Assessment and Plan  1. Annual Preventative Screening Examination   2. Labile hypertension  - EKG 12-Lead - [place], RETROPERITNL ABD,  LTD - Urinalysis, Routine w reflex microscopic - Microalbumin / creatinine urine ratio - CBC with Differential/Platelet - COMPLETE METABOLIC PANEL WITH GFR - Magnesium - TSH   3. Hyperlipidemia, mixed  - EKG 12-Lead - [place], RETROPERITNL ABD,  LTD - Lipid panel - TSH   4. Abnormal glucose  - EKG 12-Lead - [place], RETROPERITNL ABD,  LTD  - Hemoglobin A1c - Insulin, random   5. Vitamin D deficiency  - VITAMIN D 25 Hydroxy    6. ADD (attention deficit disorder) without hyperactivity   7. Spastic hemiplegia of left nondominant side as late effect of Cerebrovascular  Disease  (HCC)   8. Chronic migraine w/o aura w/o status migrainosus, not intractable   9. Hypothyroidism due to non-medication exogenous substances   10. Screening for colorectal cancer  - POC Hemoccult Bld/Stl - TSH   11. Screening for heart disease  - EKG 12-Lead   12. FHx: heart disease  - EKG  12-Lead - [place], RETROPERITNL ABD,  LTD     13. Fatigue  - Iron, Total/Total Iron Binding Cap - Vitamin B12  14. Medication management  - Urinalysis, Routine w reflex microscopic - Microalbumin / creatinine urine ratio - CBC with Differential/Platelet - COMPLETE METABOLIC PANEL WITH GFR - Magnesium - Lipid panel - TSH - Hemoglobin A1c - Insulin, random - VITAMIN D 25 Hydroxy    15. Screening-pulmonary TB  - TB Skin Test        Patient was counseled in prudent diet to achieve/maintain BMI less than 25 for weight control, BP monitoring, regular exercise and medications. Discussed med's effects and SE's. Screening labs and tests as requested with regular follow-up as recommended.  New  Rx  for Trazodone to try for Sleep.  Over 40 minutes of exam, counseling, chart review and high complex critical decision making was performed.   Marinus Maw, MD

## 2023-01-21 ENCOUNTER — Ambulatory Visit (INDEPENDENT_AMBULATORY_CARE_PROVIDER_SITE_OTHER): Payer: Medicare Other | Admitting: Internal Medicine

## 2023-01-21 ENCOUNTER — Encounter: Payer: Self-pay | Admitting: Internal Medicine

## 2023-01-21 VITALS — BP 128/80 | HR 97 | Temp 97.9°F | Resp 16 | Ht 69.0 in | Wt 166.4 lb

## 2023-01-21 DIAGNOSIS — E559 Vitamin D deficiency, unspecified: Secondary | ICD-10-CM | POA: Diagnosis not present

## 2023-01-21 DIAGNOSIS — Z23 Encounter for immunization: Secondary | ICD-10-CM | POA: Diagnosis not present

## 2023-01-21 DIAGNOSIS — Z111 Encounter for screening for respiratory tuberculosis: Secondary | ICD-10-CM | POA: Diagnosis not present

## 2023-01-21 DIAGNOSIS — Z79899 Other long term (current) drug therapy: Secondary | ICD-10-CM

## 2023-01-21 DIAGNOSIS — D649 Anemia, unspecified: Secondary | ICD-10-CM | POA: Diagnosis not present

## 2023-01-21 DIAGNOSIS — Z8249 Family history of ischemic heart disease and other diseases of the circulatory system: Secondary | ICD-10-CM

## 2023-01-21 DIAGNOSIS — Z0000 Encounter for general adult medical examination without abnormal findings: Secondary | ICD-10-CM

## 2023-01-21 DIAGNOSIS — I10 Essential (primary) hypertension: Secondary | ICD-10-CM

## 2023-01-21 DIAGNOSIS — F988 Other specified behavioral and emotional disorders with onset usually occurring in childhood and adolescence: Secondary | ICD-10-CM

## 2023-01-21 DIAGNOSIS — I69954 Hemiplegia and hemiparesis following unspecified cerebrovascular disease affecting left non-dominant side: Secondary | ICD-10-CM

## 2023-01-21 DIAGNOSIS — E782 Mixed hyperlipidemia: Secondary | ICD-10-CM | POA: Diagnosis not present

## 2023-01-21 DIAGNOSIS — Z136 Encounter for screening for cardiovascular disorders: Secondary | ICD-10-CM

## 2023-01-21 DIAGNOSIS — E032 Hypothyroidism due to medicaments and other exogenous substances: Secondary | ICD-10-CM

## 2023-01-21 DIAGNOSIS — Z1211 Encounter for screening for malignant neoplasm of colon: Secondary | ICD-10-CM

## 2023-01-21 DIAGNOSIS — R7309 Other abnormal glucose: Secondary | ICD-10-CM

## 2023-01-21 DIAGNOSIS — G43709 Chronic migraine without aura, not intractable, without status migrainosus: Secondary | ICD-10-CM

## 2023-01-21 DIAGNOSIS — Z0001 Encounter for general adult medical examination with abnormal findings: Secondary | ICD-10-CM

## 2023-01-21 DIAGNOSIS — R5383 Other fatigue: Secondary | ICD-10-CM

## 2023-01-21 NOTE — Patient Instructions (Signed)
Due to recent changes in healthcare laws, you may see the results of your imaging and laboratory studies on MyChart before your provider has had a chance to review them.  We understand that in some cases there may be results that are confusing or concerning to you. Not all laboratory results come back in the same time frame and the provider may be waiting for multiple results in order to interpret others.  Please give [place] 48 hours in order for your provider to thoroughly review all the results before contacting the office for clarification of your results.   ++++++++++++++++++++++++++++++  Vit D  & Vit C 1,000 mg   are recommended to help protect  against the Covid-19 and other Corona viruses.    Also it's recommended  to take  Zinc 50 mg  to help  protect against the Covid-19   and best place to get  is also on [email or web address]  and don't pay more than 6-8 cents /pill !  ================================ Coronavirus (COVID-19) Are you at risk?  Are you at risk for the Coronavirus (COVID-19)?  To be considered HIGH RISK for Coronavirus (COVID-19), you have to meet the following criteria:  Traveled to [place], [place], [place], [place] or [place]; or in the [place] to [place], [place], [place]  or [place]; and have fever, cough, and shortness of breath within the last 2 weeks of travel OR Been in close contact with a person diagnosed with COVID-19 within the last 2 weeks and have  fever, cough,and shortness of breath  IF YOU DO NOT MEET THESE CRITERIA, YOU ARE CONSIDERED LOW RISK FOR COVID-19.  What to do if you are HIGH RISK for COVID-19?  If you are having a medical emergency, call 911. Seek medical care right away. Before you go to a doctor's office, urgent care or emergency department,  call ahead and tell them about your recent travel, contact with someone diagnosed with COVID-19   and your symptoms.  You should receive instructions from your physician's office regarding  next steps of care.  When you arrive at healthcare provider, tell the healthcare staff immediately you have returned from  visiting [place], [place], [place], [place] or [place]; or traveled in the [place] to [place], [place],  [place] or [place] in the last two weeks or you have been in close contact with a person diagnosed with  COVID-19 in the last 2 weeks.   Tell the health care staff about your symptoms: fever, cough and shortness of breath. After you have been seen by a medical provider, you will be either: Tested for (COVID-19) and discharged home on quarantine except to seek medical care if  symptoms worsen, and asked to  Stay home and avoid contact with others until you get your results (4-5 days)  Avoid travel on public transportation if possible (such as bus, train, or airplane) or Sent to the Emergency Department by EMS for evaluation, COVID-19 testing  and  possible admission depending on your condition and test results.  What to do if you are LOW RISK for COVID-19?  Reduce your risk of any infection by using the same precautions used for avoiding the common cold or flu:  Wash your hands often with soap and warm water for at least 20 seconds.  If soap and water are not readily available,  use an alcohol-based hand sanitizer with at least 60% alcohol.  If coughing or sneezing, cover your mouth and nose by coughing  or sneezing into the elbow areas of your shirt or coat,  into a tissue or into your sleeve (not your hands). Avoid shaking hands with others and consider head nods or verbal greetings only. Avoid touching your eyes, nose, or mouth with unwashed hands.  Avoid close contact with people who are sick. Avoid places or events with large numbers of people in one location, like concerts or sporting events. Carefully consider travel plans you have or are making. If you are planning any travel outside or inside the [place], visit the CDC's Travelers' Health webpage for  the latest health notices. If you have some symptoms but not all symptoms, continue to monitor at home and seek medical attention  if your symptoms worsen. If you are having a medical emergency, call 911. >>>>>>>>>>>>>>>>>>>>>>> Preventive Care for Adults  A healthy lifestyle and preventive care can promote health and wellness. Preventive health guidelines for women include the following key practices. A routine yearly physical is a good way to check with your health care provider about your health and preventive screening. It is a chance to share any concerns and updates on your health and to receive a thorough exam. Visit your dentist for a routine exam and preventive care every 6 months. Brush your teeth twice a day and floss once a day. Good oral hygiene prevents tooth decay and gum disease. The frequency of eye exams is based on your age, health, family medical history, use of contact lenses, and other factors. Follow your health care provider's recommendations for frequency of eye exams. Eat a healthy diet. Foods like vegetables, fruits, whole grains, low-fat dairy products, and lean protein foods contain the nutrients you need without too many calories. Decrease your intake of foods high in solid fats, added sugars, and salt. Eat the right amount of calories for you. Get information about a proper diet from your health care provider, if necessary. Regular physical exercise is one of the most important things you can do for your health. Most adults should get at least 150 minutes of moderate-intensity exercise (any activity that increases your heart rate and causes you to sweat) each week. In addition, most adults need muscle-strengthening exercises on 2 or more days a week. Maintain a healthy weight. The body mass index (BMI) is a screening tool to identify possible weight problems. It provides an estimate of body fat based on height and weight. Your health care provider can find your BMI and can  help you achieve or maintain a healthy weight. For adults 20 years and older: A BMI below 18.5 is considered underweight. A BMI of 18.5 to 24.9 is normal. A BMI of 25 to 29.9 is considered overweight. A BMI of 30 and above is considered obese. Maintain normal blood lipids and cholesterol levels by exercising and minimizing your intake of saturated fat. Eat a balanced diet with plenty of fruit and vegetables. Blood tests for lipids and cholesterol should begin at age 43 and be repeated every 5 years. If your lipid or cholesterol levels are high, you are over 50, or you are at high risk for heart disease, you may need your cholesterol levels checked more frequently. Ongoing high lipid and cholesterol levels should be treated with medicines if diet and exercise are not working. If you smoke, find out from your health care provider how to quit. If you do not use tobacco, do not start. Lung cancer screening is recommended for adults aged 11-80 years who are at high risk for developing  lung cancer because of a history of smoking. A yearly low-dose CT scan of the lungs is recommended for people who have at least a 30-pack-year history of smoking and are a current smoker or have quit within the past 15 years. A pack year of smoking is smoking an average of 1 pack of cigarettes a day for 1 year (for example: 1 pack a day for 30 years or 2 packs a day for 15 years). Yearly screening should continue until the smoker has stopped smoking for at least 15 years. Yearly screening should be stopped for people who develop a health problem that would prevent them from having lung cancer treatment. High blood pressure causes heart disease and increases the risk of stroke. Your blood pressure should be checked at least every 1 to 2 years. Ongoing high blood pressure should be treated with medicines if weight loss and exercise do not work. If you are 32-3 years old, ask your health care provider if you should take aspirin to  prevent strokes. Diabetes screening involves taking a blood sample to check your fasting blood sugar level. This should be done once every 3 years, after age 32, if you are within normal weight and without risk factors for diabetes. Testing should be considered at a younger age or be carried out more frequently if you are overweight and have at least 1 risk factor for diabetes. Breast cancer screening is essential preventive care for women. You should practice "breast self-awareness." This means understanding the normal appearance and feel of your breasts and may include breast self-examination. Any changes detected, no matter how small, should be reported to a health care provider. Women in their 45s and 30s should have a clinical breast exam (CBE) by a health care provider as part of a regular health exam every 1 to 3 years. After age 83, women should have a CBE every year. Starting at age 92, women should consider having a mammogram (breast X-ray test) every year. Women who have a family history of breast cancer should talk to their health care provider about genetic screening. Women at a high risk of breast cancer should talk to their health care providers about having an MRI and a mammogram every year. Breast cancer gene (BRCA)-related cancer risk assessment is recommended for women who have family members with BRCA-related cancers. BRCA-related cancers include breast, ovarian, tubal, and peritoneal cancers. Having family members with these cancers may be associated with an increased risk for harmful changes (mutations) in the breast cancer genes BRCA1 and BRCA2. Results of the assessment will determine the need for genetic counseling and BRCA1 and BRCA2 testing. Routine pelvic exams to screen for cancer are no longer recommended for nonpregnant women who are considered low risk for cancer of the pelvic organs (ovaries, uterus, and vagina) and who do not have symptoms. Ask your health care provider if a  screening pelvic exam is right for you. If you have had past treatment for cervical cancer or a condition that could lead to cancer, you need Pap tests and screening for cancer for at least 20 years after your treatment. If Pap tests have been discontinued, your risk factors (such as having a new sexual partner) need to be reassessed to determine if screening should be resumed. Some women have medical problems that increase the chance of getting cervical cancer. In these cases, your health care provider may recommend more frequent screening and Pap tests. Colorectal cancer can be detected and often prevented. Most routine colorectal  cancer screening begins at the age of 10 years and continues through age 19 years. However, your health care provider may recommend screening at an earlier age if you have risk factors for colon cancer. On a yearly basis, your health care provider may provide home test kits to check for hidden blood in the stool. Use of a small camera at the end of a tube, to directly examine the colon (sigmoidoscopy or colonoscopy), can detect the earliest forms of colorectal cancer. Talk to your health care provider about this at age 28, when routine screening begins.  Direct exam of the colon should be repeated every 5-10 years through age 71 years, unless early forms of pre-cancerous polyps or small growths are found. Hepatitis C blood testing is recommended for all people born from [birth year] through 1965 and any individual with known risks for hepatitis C.  Osteoporosis is a disease in which the bones lose minerals and strength with aging. This can result in serious bone fractures or breaks. The risk of osteoporosis can be identified using a bone density scan. Women ages 21 years and over and women at risk for fractures or osteoporosis should discuss screening with their health care providers. Ask your health care provider whether you should take a calcium supplement or vitamin D to reduce the rate  of osteoporosis. Menopause can be associated with physical symptoms and risks. Hormone replacement therapy is available to decrease symptoms and risks. You should talk to your health care provider about whether hormone replacement therapy is right for you. Use sunscreen. Apply sunscreen liberally and repeatedly throughout the day. You should seek shade when your shadow is [place] than you. Protect yourself by wearing long sleeves, pants, a wide-brimmed hat, and sunglasses year round, whenever you are outdoors. Once a month, do a whole body skin exam, using a mirror to look at the skin on your back. Tell your health care provider of new moles, moles that have irregular borders, moles that are larger than a pencil eraser, or moles that have changed in shape or color. Stay current with required vaccines (immunizations). Influenza vaccine. All adults should be immunized every year. Tetanus, diphtheria, and acellular pertussis (Td, Tdap) vaccine. Pregnant women should receive 1 dose of Tdap vaccine during each pregnancy. The dose should be obtained regardless of the length of time since the last dose. Immunization is preferred during the 27th-36th week of gestation. An adult who has not previously received Tdap or who does not know her vaccine status should receive 1 dose of Tdap. This initial dose should be followed by tetanus and diphtheria toxoids (Td) booster doses every 10 years. Adults with an unknown or incomplete history of completing a 3-dose immunization series with Td-containing vaccines should begin or complete a primary immunization series including a Tdap dose. Adults should receive a Td booster every 10 years. Varicella vaccine. An adult without evidence of immunity to varicella should receive 2 doses or a second dose if she has previously received 1 dose. Pregnant females who do not have evidence of immunity should receive the first dose after pregnancy. This first dose should be obtained before  leaving the health care facility. The second dose should be obtained 4-8 weeks after the first dose. Human papillomavirus (HPV) vaccine. Females aged 13-26 years who have not received the vaccine previously should obtain the 3-dose series. The vaccine is not recommended for use in pregnant females. However, pregnancy testing is not needed before receiving a dose. If a female is found to  be pregnant after receiving a dose, no treatment is needed. In that case, the remaining doses should be delayed until after the pregnancy. Immunization is recommended for any person with an immunocompromised condition through the age of 55 years if she did not get any or all doses earlier. During the 3-dose series, the second dose should be obtained 4-8 weeks after the first dose. The third dose should be obtained 24 weeks after the first dose and 16 weeks after the second dose. Zoster vaccine. One dose is recommended for adults aged 15 years or older unless certain conditions are present. Measles, mumps, and rubella (MMR) vaccine. Adults born before 20 generally are considered immune to measles and mumps. Adults born in [birth year] or later should have 1 or more doses of MMR vaccine unless there is a contraindication to the vaccine or there is laboratory evidence of immunity to each of the three diseases. A routine second dose of MMR vaccine should be obtained at least 28 days after the first dose for students attending postsecondary schools, health care workers, or international travelers. People who received inactivated measles vaccine or an unknown type of measles vaccine during 1963-1967 should receive 2 doses of MMR vaccine. People who received inactivated mumps vaccine or an unknown type of mumps vaccine before 1979 and are at high risk for mumps infection should consider immunization with 2 doses of MMR vaccine. For females of childbearing age, rubella immunity should be determined. If there is no evidence of immunity, females  who are not pregnant should be vaccinated. If there is no evidence of immunity, females who are pregnant should delay immunization until after pregnancy. Unvaccinated health care workers born before [birth year] who lack laboratory evidence of measles, mumps, or rubella immunity or laboratory confirmation of disease should consider measles and mumps immunization with 2 doses of MMR vaccine or rubella immunization with 1 dose of MMR vaccine. Pneumococcal 13-valent conjugate (PCV13) vaccine. When indicated, a person who is uncertain of her immunization history and has no record of immunization should receive the PCV13 vaccine. An adult aged 4 years or older who has certain medical conditions and has not been previously immunized should receive 1 dose of PCV13 vaccine. This PCV13 should be followed with a dose of pneumococcal polysaccharide (PPSV23) vaccine. The PPSV23 vaccine dose should be obtained at least 1 or more year(s) after the dose of PCV13 vaccine. An adult aged 15 years or older who has certain medical conditions and previously received 1 or more doses of PPSV23 vaccine should receive 1 dose of PCV13. The PCV13 vaccine dose should be obtained 1 or more years after the last PPSV23 vaccine dose.  Pneumococcal polysaccharide (PPSV23) vaccine. When PCV13 is also indicated, PCV13 should be obtained first. All adults aged 9 years and older should be immunized. An adult younger than age 14 years who has certain medical conditions should be immunized. Any person who resides in a nursing home or long-term care facility should be immunized. An adult smoker should be immunized. People with an immunocompromised condition and certain other conditions should receive both PCV13 and PPSV23 vaccines. People with human immunodeficiency virus (HIV) infection should be immunized as soon as possible after diagnosis. Immunization during chemotherapy or radiation therapy should be avoided. Routine use of PPSV23 vaccine is not  recommended for American Indians, [place] Natives, or people younger than 65 years unless there are medical conditions that require PPSV23 vaccine. When indicated, people who have unknown immunization and have no record of immunization should receive  PPSV23 vaccine. One-time revaccination 5 years after the first dose of PPSV23 is recommended for people aged 19-64 years who have chronic kidney failure, nephrotic syndrome, asplenia, or immunocompromised conditions. People who received 1-2 doses of PPSV23 before age 36 years should receive another dose of PPSV23 vaccine at age 52 years or later if at least 5 years have passed since the previous dose. Doses of PPSV23 are not needed for people immunized with PPSV23 at or after age 34 years.  Preventive Services / Frequency  Ages 74 to 1 years Blood pressure check. Lipid and cholesterol check. Lung cancer screening. / Every year if you are aged 53-80 years and have a 30-pack-year history of smoking and currently smoke or have quit within the past 15 years. Yearly screening is stopped once you have quit smoking for at least 15 years or develop a health problem that would prevent you from having lung cancer treatment. Clinical breast exam.** / Every year after age 16 years.  BRCA-related cancer risk assessment.** / For women who have family members with a BRCA-related cancer (breast, ovarian, tubal, or peritoneal cancers). Mammogram.** / Every year beginning at age 26 years and continuing for as long as you are in good health. Consult with your health care provider. Pap test.** / Every 3 years starting at age 19 years through age 52 or 29 years with a history of 3 consecutive normal Pap tests. HPV screening.** / Every 3 years from ages 18 years through ages 4 to 110 years with a history of 3 consecutive normal Pap tests. Fecal occult blood test (FOBT) of stool. / Every year beginning at age 70 years and continuing until age 53 years. You may not need to do this  test if you get a colonoscopy every 10 years. Flexible sigmoidoscopy or colonoscopy.** / Every 5 years for a flexible sigmoidoscopy or every 10 years for a colonoscopy beginning at age 66 years and continuing until age 44 years. Hepatitis C blood test.** / For all people born from [birth year] through 1967 and any individual with known risks for hepatitis C. Skin self-exam. / Monthly. Influenza vaccine. / Every year. Tetanus, diphtheria, and acellular pertussis (Tdap/Td) vaccine.** / Consult your health care provider. Pregnant women should receive 1 dose of Tdap vaccine during each pregnancy. 1 dose of Td every 10 years. Varicella vaccine.** / Consult your health care provider. Pregnant females who do not have evidence of immunity should receive the first dose after pregnancy. Zoster vaccine.** / 1 dose for adults aged 90 years or older. Pneumococcal 13-valent conjugate (PCV13) vaccine.** / Consult your health care provider. Pneumococcal polysaccharide (PPSV23) vaccine.** / 1 to 2 doses if you smoke cigarettes or if you have certain conditions. Meningococcal vaccine.** / Consult your health care provider. Hepatitis A vaccine.** / Consult your health care provider. Hepatitis B vaccine.** / Consult your health care provider. Screening for abdominal aortic aneurysm (AAA)  by ultrasound is recommended for people over 50 who have history of high blood pressure or who are current or former smokers. ++++++++++++++++++ Recommend Adult Low Dose Aspirin or  coated  Aspirin 81 mg daily  To reduce risk of Colon Cancer 40 %,  Skin Cancer 26 % ,  Melanoma 46%  and  Pancreatic cancer 60% +++++++++++++++++++ Vitamin D goal  is between 70-100.  Please make sure that you are taking your Vitamin D as directed.  It is very important as a natural anti-inflammatory  helping hair, skin, and nails, as well as reducing stroke and heart  attack risk.  It helps your bones and helps with mood. It also decreases numerous  cancer risks so please take it as directed.  Low Vit D is associated with a 200-300% higher risk for CANCER  and 200-300% higher risk for HEART   ATTACK  &  STROKE.   .....................................Marland Kitchen It is also associated with higher death rate at younger ages,  autoimmune diseases like Rheumatoid arthritis, Lupus, Multiple Sclerosis.    Also many other serious conditions, like depression, Alzheimer's Dementia, infertility, muscle aches, fatigue, fibromyalgia - just to name a few. ++++++++++++++++++ Recommend the book "The END of DIETING" by Dr Excell Seltzer  & the book "The END of DIABETES " by Dr Excell Seltzer At [email or web address] - get book & Audio CD's    Being diabetic has a  300% increased risk for heart attack, stroke, cancer, and alzheimer- type vascular dementia. It is very important that you work harder with diet by avoiding all foods that are white. Avoid white rice (brown & wild rice is OK), white potatoes (sweetpotatoes in moderation is OK), White bread or wheat bread or anything made out of white flour like bagels, donuts, rolls, buns, biscuits, cakes, pastries, cookies, pizza crust, and pasta (made from white flour & egg whites) - vegetarian pasta or spinach or wheat pasta is OK. Multigrain breads like Arnold's or [employer], or multigrain sandwich thins or flatbreads.  Diet, exercise and weight loss can reverse and cure diabetes in the early stages.  Diet, exercise and weight loss is very important in the control and prevention of complications of diabetes which affects every system in your body, ie. Brain - dementia/stroke, eyes - glaucoma/blindness, heart - heart attack/heart failure, kidneys - dialysis, stomach - gastric paralysis, intestines - malabsorption, nerves - severe painful neuritis, circulation - gangrene & loss of a leg(s), and finally cancer and Alzheimers.    I recommend avoid fried & greasy foods,  sweets/candy, white rice (brown or wild rice or Quinoa is OK), white  potatoes (sweet potatoes are OK) - anything made from white flour - bagels, doughnuts, rolls, buns, biscuits,white and wheat breads, pizza crust and traditional pasta made of white flour & egg white(vegetarian pasta or spinach or wheat pasta is OK).  Multi-grain bread is OK - like multi-grain flat bread or sandwich thins. Avoid alcohol in excess. Exercise is also important.    Eat all the vegetables you want - avoid meat, especially red meat and dairy - especially cheese.  Cheese is the most concentrated form of trans-fats which is the worst thing to clog up our arteries. Veggie cheese is OK which can be found in the fresh produce section at [employer] or [employer] or [employer]  ++++++++++++++++++++++ DASH Eating Plan  DASH stands for "Dietary Approaches to Stop Hypertension."   The DASH eating plan is a healthy eating plan that has been shown to reduce high blood pressure (hypertension). Additional health benefits may include reducing the risk of type 2 diabetes mellitus, heart disease, and stroke. The DASH eating plan may also help with weight loss. WHAT DO I NEED TO KNOW ABOUT THE DASH EATING PLAN? For the DASH eating plan, you will follow these general guidelines: Choose foods with a percent daily value for sodium of less than 5% (as listed on the food label). Use salt-free seasonings or herbs instead of table salt or sea salt. Check with your health care provider or pharmacist before using salt substitutes. Eat lower-sodium products, often labeled as "lower sodium" or "no  salt added." Eat fresh foods. Eat more vegetables, fruits, and low-fat dairy products. Choose whole grains. Look for the word "whole" as the first word in the ingredient list. Choose fish  Limit sweets, desserts, sugars, and sugary drinks. Choose heart-healthy fats. Eat veggie cheese  Eat more home-cooked food and less restaurant, buffet, and fast food. Limit fried foods. Cook foods using methods other than  frying. Limit canned vegetables. If you do use them, rinse them well to decrease the sodium. When eating at a restaurant, ask that your food be prepared with less salt, or no salt if possible.                      WHAT FOODS CAN I EAT? Read Dr Fara Olden Fuhrman's books on The End of Dieting & The End of Diabetes  Grains Whole grain or whole wheat bread. Brown rice. Whole grain or whole wheat pasta. Quinoa, bulgur, and whole grain cereals. Low-sodium cereals. Corn or whole wheat flour tortillas. Whole grain cornbread. Whole grain crackers. Low-sodium crackers.  Vegetables Fresh or frozen vegetables (raw, steamed, roasted, or grilled). Low-sodium or reduced-sodium tomato and vegetable juices. Low-sodium or reduced-sodium tomato sauce and paste. Low-sodium or reduced-sodium canned vegetables.   Fruits All fresh, canned (in natural juice), or frozen fruits.  Protein Products  All fish and seafood.  Dried beans, peas, or lentils. Unsalted nuts and seeds. Unsalted canned beans.  Dairy Low-fat dairy products, such as skim or 1% milk, 2% or reduced-fat cheeses, low-fat ricotta or cottage cheese, or plain low-fat yogurt. Low-sodium or reduced-sodium cheeses.  Fats and Oils Tub margarines without trans fats. Light or reduced-fat mayonnaise and salad dressings (reduced sodium). Avocado. Safflower, olive, or canola oils. Natural peanut or almond butter.  Other Unsalted popcorn and pretzels. The items listed above may not be a complete list of recommended foods or beverages. Contact your dietitian for more options.  ++++++++++++++++++  WHAT FOODS ARE NOT RECOMMENDED? Grains/ White flour or wheat flour White bread. White pasta. White rice. Refined cornbread. Bagels and croissants. Crackers that contain trans fat.  Vegetables  Creamed or fried vegetables. Vegetables in a . Regular canned vegetables. Regular canned tomato sauce and paste. Regular tomato and vegetable juices.  Fruits Dried fruits.  Canned fruit in light or heavy syrup. Fruit juice.  Meat and Other Protein Products Meat in general - RED meat & White meat.  Fatty cuts of meat. Ribs, chicken wings, all processed meats as bacon, sausage, bologna, salami, fatback, hot dogs, bratwurst and packaged luncheon meats.  Dairy Whole or 2% milk, cream, half-and-half, and cream cheese. Whole-fat or sweetened yogurt. Full-fat cheeses or blue cheese. Non-dairy creamers and whipped toppings. Processed cheese, cheese spreads, or cheese curds.  Condiments Onion and garlic salt, seasoned salt, table salt, and sea salt. Canned and packaged gravies. Worcestershire sauce. Tartar sauce. Barbecue sauce. Teriyaki sauce. Soy sauce, including reduced sodium. Steak sauce. Fish sauce. Oyster sauce. Cocktail sauce. Horseradish. Ketchup and mustard. Meat flavorings and tenderizers. Bouillon cubes. Hot sauce. Tabasco sauce. Marinades. Taco seasonings. Relishes.  Fats and Oils Butter, stick margarine, lard, shortening and bacon fat. Coconut, palm kernel, or palm oils. Regular salad dressings.  Pickles and olives. Salted popcorn and pretzels.  The items listed above may not be a complete list of foods and beverages to avoid.

## 2023-01-22 NOTE — Progress Notes (Signed)
<>*<>*<>*<>*<>*<>*<>*<>*<>*<>*<>*<>*<>*<>*<>*<>*<>*<>*<>*<>*<>*<>*<>*<>*<> <>*<>*<>*<>*<>*<>*<>*<>*<>*<>*<>*<>*<>*<>*<>*<>*<>*<>*<>*<>*<>*<>*<>*<>*<>  -  Test results slightly outside the reference range are not unusual. If there is anything important, I will review this with you,  otherwise it is considered normal test values.  If you have further questions,  please do not hesitate to contact me at the office or via My Chart.   <>*<>*<>*<>*<>*<>*<>*<>*<>*<>*<>*<>*<>*<>*<>*<>*<>*<>*<>*<>*<>*<>*<>*<>*<> <>*<>*<>*<>*<>*<>*<>*<>*<>*<>*<>*<>*<>*<>*<>*<>*<>*<>*<>*<>*<>*<>*<>*<>*<>  -  A1c = 6.0% Blood sugar and A1c are elevated in the borderline and                                                    early or pre-diabetes range which has the same   300% increased risk for heart attack, stroke, cancer and                                       alzheimer- type vascular dementia as full blown diabetes.   But the good news is that diet, exercise with                                                weight loss can cure the early diabetes at this point.  <>*<>*<>*<>*<>*<>*<>*<>*<>*<>*<>*<>*<>*<>*<>*<>*<>*<>*<>*<>*<>*<>*<>*<>*<> <>*<>*<>*<>*<>*<>*<>*<>*<>*<>*<>*<>*<>*<>*<>*<>*<>*<>*<>*<>*<>*<>*<>*<>*<>  -  Vitamin B12 & Iron levels Normal & OK   <>*<>*<>*<>*<>*<>*<>*<>*<>*<>*<>*<>*<>*<>*<>*<>*<>*<>*<>*<>*<>*<>*<>*<>*<> <>*<>*<>*<>*<>*<>*<>*<>*<>*<>*<>*<>*<>*<>*<>*<>*<>*<>*<>*<>*<>*<>*<>*<>*<>  -  Chol = 166  -  Excellent   - Very low risk for Heart Attack  / Stroke  <>*<>*<>*<>*<>*<>*<>*<>*<>*<>*<>*<>*<>*<>*<>*<>*<>*<>*<>*<>*<>*<>*<>*<>*<> <>*<>*<>*<>*<>*<>*<>*<>*<>*<>*<>*<>*<>*<>*<>*<>*<>*<>*<>*<>*<>*<>*<>*<>*<>  -  Vitamin D = 84   - Excellent   <>*<>*<>*<>*<>*<>*<>*<>*<>*<>*<>*<>*<>*<>*<>*<>*<>*<>*<>*<>*<>*<>*<>*<>*<> <>*<>*<>*<>*<>*<>*<>*<>*<>*<>*<>*<>*<>*<>*<>*<>*<>*<>*<>*<>*<>*<>*<>*<>*<>  -  All Else - CBC - Kidneys - Electrolytes - Liver - Magnesium & Thyroid    - all  Normal /  OK  <>*<>*<>*<>*<>*<>*<>*<>*<>*<>*<>*<>*<>*<>*<>*<>*<>*<>*<>*<>*<>*<>*<>*<>*<> <>*<>*<>*<>*<>*<>*<>*<>*<>*<>*<>*<>*<>*<>*<>*<>*<>*<>*<>*<>*<>*<>*<>*<>*<>  -  Keep up the [place] Work  !  <>*<>*<>*<>*<>*<>*<>*<>*<>*<>*<>*<>*<>*<>*<>*<>*<>*<>*<>*<>*<>*<>*<>*<>*<> <>*<>*<>*<>*<>*<>*<>*<>*<>*<>*<>*<>*<>*<>*<>*<>*<>*<>*<>*<>*<>*<>*<>*<>*<>

## 2023-01-23 ENCOUNTER — Encounter: Payer: Self-pay | Admitting: Internal Medicine

## 2023-01-24 LAB — IRON,TIBC AND FERRITIN PANEL
%SAT: 30 % (ref 16–45)
Ferritin: 32 ng/mL (ref 16–288)
Iron: 104 ug/dL (ref 45–160)
TIBC: 345 ug/dL (ref 250–450)

## 2023-01-24 LAB — LIPID PANEL
Cholesterol: 166 mg/dL (ref <200)
HDL: 61 mg/dL (ref >=50)
LDL Cholesterol (Calc): 81 mg/dL
Non-HDL Cholesterol (Calc): 105 mg/dL (ref <130)
Total CHOL/HDL Ratio: 2.7 (calc) (ref <5.0)
Triglycerides: 138 mg/dL (ref <150)

## 2023-01-24 LAB — URINALYSIS, ROUTINE W REFLEX MICROSCOPIC
Bilirubin Urine: NEGATIVE
Glucose, UA: NEGATIVE
Hgb urine dipstick: NEGATIVE
Ketones, ur: NEGATIVE
Leukocytes,Ua: NEGATIVE
Nitrite: NEGATIVE
Protein, ur: NEGATIVE
Specific Gravity, Urine: 1.02 (ref 1.001–1.035)
pH: 7 (ref 5.0–8.0)

## 2023-01-24 LAB — CBC WITH DIFFERENTIAL/PLATELET
Absolute Lymphocytes: 1564 {cells}/uL (ref 850–3900)
Absolute Monocytes: 437 {cells}/uL (ref 200–950)
Basophils Absolute: 18 {cells}/uL (ref 0–200)
Basophils Relative: 0.3 %
Eosinophils Absolute: 100 {cells}/uL (ref 15–500)
Eosinophils Relative: 1.7 %
HCT: 44.7 % (ref 35.0–45.0)
Hemoglobin: 14.9 g/dL (ref 11.7–15.5)
MCH: 31.4 pg (ref 27.0–33.0)
MCHC: 33.3 g/dL (ref 32.0–36.0)
MCV: 94.3 fL (ref 80.0–100.0)
MPV: 11.1 fL (ref 7.5–12.5)
Monocytes Relative: 7.4 %
Neutro Abs: 3782 {cells}/uL (ref 1500–7800)
Neutrophils Relative %: 64.1 %
Platelets: 205 10*3/uL (ref 140–400)
RBC: 4.74 10*6/uL (ref 3.80–5.10)
RDW: 12.5 % (ref 11.0–15.0)
Total Lymphocyte: 26.5 %
WBC: 5.9 10*3/uL (ref 3.8–10.8)

## 2023-01-24 LAB — COMPLETE METABOLIC PANEL WITH GFR
AG Ratio: 2 (calc) (ref 1.0–2.5)
ALT: 24 U/L (ref 6–29)
AST: 21 U/L (ref 10–35)
Albumin: 4.6 g/dL (ref 3.6–5.1)
Alkaline phosphatase (APISO): 60 U/L (ref 37–153)
BUN: 19 mg/dL (ref 7–25)
CO2: 29 mmol/L (ref 20–32)
Calcium: 10 mg/dL (ref 8.6–10.4)
Chloride: 105 mmol/L (ref 98–110)
Creat: 0.85 mg/dL (ref 0.50–1.05)
Globulin: 2.3 g/dL (ref 1.9–3.7)
Glucose, Bld: 102 mg/dL (ref 65–139)
Potassium: 4.3 mmol/L (ref 3.5–5.3)
Sodium: 140 mmol/L (ref 135–146)
Total Bilirubin: 0.4 mg/dL (ref 0.2–1.2)
Total Protein: 6.9 g/dL (ref 6.1–8.1)
eGFR: 76 mL/min/{1.73_m2} (ref >=60)

## 2023-01-24 LAB — VITAMIN B12: Vitamin B-12: 634 pg/mL (ref 200–1100)

## 2023-01-24 LAB — INSULIN, RANDOM: Insulin: 30.3 u[IU]/mL — ABNORMAL HIGH

## 2023-01-24 LAB — TSH: TSH: 0.48 m[IU]/L (ref 0.40–4.50)

## 2023-01-24 LAB — HEMOGLOBIN A1C
Hgb A1c MFr Bld: 6 %{Hb} — ABNORMAL HIGH (ref <5.7)
Mean Plasma Glucose: 126 mg/dL
eAG (mmol/L): 7 mmol/L

## 2023-01-24 LAB — VITAMIN D 25 HYDROXY (VIT D DEFICIENCY, FRACTURES): Vit D, 25-Hydroxy: 84 ng/mL (ref 30–100)

## 2023-01-24 LAB — LAMOTRIGINE LEVEL: Lamotrigine Lvl: 3.1 ug/mL (ref 2.5–15.0)

## 2023-01-24 LAB — MICROALBUMIN / CREATININE URINE RATIO
Creatinine, Urine: 94 mg/dL (ref 20–275)
Microalb, Ur: <0.2 mg/dL

## 2023-01-24 LAB — MAGNESIUM: Magnesium: 2.1 mg/dL (ref 1.5–2.5)

## 2023-01-26 DIAGNOSIS — H25813 Combined forms of age-related cataract, bilateral: Secondary | ICD-10-CM | POA: Diagnosis not present

## 2023-01-26 DIAGNOSIS — H5203 Hypermetropia, bilateral: Secondary | ICD-10-CM | POA: Diagnosis not present

## 2023-02-03 ENCOUNTER — Other Ambulatory Visit: Payer: Self-pay | Admitting: Nurse Practitioner

## 2023-02-03 DIAGNOSIS — F419 Anxiety disorder, unspecified: Secondary | ICD-10-CM

## 2023-02-04 ENCOUNTER — Other Ambulatory Visit: Payer: Self-pay | Admitting: Nurse Practitioner

## 2023-02-04 DIAGNOSIS — F419 Anxiety disorder, unspecified: Secondary | ICD-10-CM

## 2023-02-07 ENCOUNTER — Other Ambulatory Visit: Payer: Self-pay | Admitting: Nurse Practitioner

## 2023-02-07 DIAGNOSIS — F419 Anxiety disorder, unspecified: Secondary | ICD-10-CM

## 2023-02-08 ENCOUNTER — Telehealth: Payer: Self-pay | Admitting: Nurse Practitioner

## 2023-02-08 ENCOUNTER — Other Ambulatory Visit: Payer: Self-pay | Admitting: Internal Medicine

## 2023-02-08 DIAGNOSIS — F5101 Primary insomnia: Secondary | ICD-10-CM

## 2023-02-08 MED ORDER — TRAZODONE HCL 150 MG PO TABS
ORAL_TABLET | ORAL | 1 refills | Status: AC
Start: 2023-02-08 — End: ?

## 2023-02-08 NOTE — Telephone Encounter (Signed)
Please send a message to Dr. Oneta Rack as he spoke with her at their last visit

## 2023-02-08 NOTE — Telephone Encounter (Signed)
Refill request on Xanax. Pls send to [id number] - [place], [place] - [address]

## 2023-02-16 ENCOUNTER — Ambulatory Visit: Payer: Medicare Other | Admitting: Neurology

## 2023-02-16 VITALS — BP 127/82 | HR 79

## 2023-02-16 DIAGNOSIS — I69954 Hemiplegia and hemiparesis following unspecified cerebrovascular disease affecting left non-dominant side: Secondary | ICD-10-CM

## 2023-02-16 MED ORDER — ONABOTULINUMTOXINA 100 UNITS IJ SOLR
300.0000 [IU] | Freq: Once | INTRAMUSCULAR | Status: AC
  Administered 2023-02-16: 300 [IU] via INTRAMUSCULAR

## 2023-02-16 NOTE — Progress Notes (Signed)
  Spastic left hemiparesis following right internal carotid artery dissection, right MCA stroke in June 2000   Botulism toxin injection every 3 months for spastic left  hemiparesis injection  Under EMG,  injected Botox A 300 units  Left pronator teres 25 units Left palmaris longus 25 units Left flexor digitorum profundus 25 units Right levator scapula 25 units  Left pectoralis major 50 units Left latissimus dorsii 50 units  Left flexor digitorum longus 50 units Left tibialis posterior 50 units    Levert Feinstein, M.D. Ph.D.  [facility] [address] [place], [place] [address] Ph: [phone] Fax: [phone]

## 2023-02-16 NOTE — Progress Notes (Deleted)
Assessment and Plan:  There are no diagnoses linked to this encounter.    Further disposition pending results of labs. Discussed med's effects and SE's.   Over 30 minutes of exam, counseling, chart review, and critical decision making was performed.   Future Appointments  Date Time Provider Department Center  02/16/2023  2:00 PM Levert Feinstein, MD [facility] None  02/17/2023 10:30 AM Raynelle Dick, NP [facility] None  04/29/2023 11:30 AM Raynelle Dick, NP [facility] None  05/18/2023  2:00 PM Levert Feinstein, MD [facility] None  08/08/2023  2:30 PM Lucky Cowboy, MD [facility] None  11/18/2023 11:30 AM Raynelle Dick, NP [facility] None  02/01/2024 10:00 AM Lucky Cowboy, MD [facility] None    ------------------------------------------------------------------------------------------------------------------   HPI There were no vitals taken for this visit. 62 y.o.female presents for  Past Medical History:  Diagnosis Date   Anemia    Anxiety    Asthma    Chronic heartburn    Depression    Elevated cholesterol    on Crestor   Headache(784.0)    Hypertension    Hypothyroidism    S/P thyroidectomy   Migraine    Occlusion and stenosis of carotid artery with cerebral infarction    Spasm of muscle    Unspecified cerebral artery occlusion with cerebral infarction    Vitamin D deficiency      Allergies  Allergen Reactions   Penicillins Anaphylaxis and Hives   Imitrex [Sumatriptan]    Kiwi Extract Swelling   Other Swelling    kiwi   Sulfa Antibiotics Hives   Zomig [Zolmitriptan]     Current Outpatient Medications on File Prior to Visit  Medication Sig   albuterol (PROVENTIL HFA) 108 (90 Base) MCG/ACT inhaler 1 to 2 inhalations 10-15 minutes apart every 4 hours if needed for asthma rescue   ALPRAZolam (XANAX) 0.5 MG tablet TAKE 1/2-1 TABS BY MOUTH AT BEDTIME AS NEEDED FOR SLEEP&MAX 5 DAYS/WEEK TO AVOID ADDICTION&DEMENTIA   amphetamine-dextroamphetamine (ADDERALL) 10 MG  tablet Take  1/2 - 1 tablet  1 or 2 x /day  for ADD   botulinum toxin Type A (BOTOX) 100 units SOLR injection Inject 300 Units into the muscle every 3 (three) months.   buPROPion (WELLBUTRIN XL) 150 MG 24 hr tablet TAKE 1 TABLET EVERY MORNING FOR MOOD. FOCUS & CONCENTRATION   butalbital-acetaminophen-caffeine (FIORICET) 50-325-40 MG tablet TAKE 1 TABLET BY MOUTH DAILY AS NEEDED FOR HEADACHE. DO NOT REFILL IN LESS THAN 30 DAYS,   cetirizine (ZYRTEC) 10 MG tablet Take 10 mg by mouth daily.   Estradiol 10 MCG TABS vaginal tablet Place 10 mcg vaginally 2 (two) times a week.   fenofibrate micronized (LOFIBRA) 134 MG capsule TAKE 1 CAPSULE DAILY FOR TRIGLYCERIDES (BLOOD FATS)   fluticasone furoate-vilanterol (BREO ELLIPTA) 100-25 MCG/ACT AEPB Use  1 Inhalation Daily   hydrocortisone (ANUSOL-HC) 2.5 % rectal cream Place rectally 2 (two) times daily.   lamoTRIgine (LAMICTAL) 100 MG tablet Take 1 tablet (100 mg total) by mouth 2 (two) times daily.   levothyroxine (SYNTHROID) 88 MCG tablet TAKE 1 TABLET DAILY ON AN EMPTY STOMACH WITH ONLY WATER FOR 30 MINS& NO ANTACID MEDS, CALCIUM, OR MAGNESIUM FOR 4 HOURS. AVOID BIOTIN   magnesium oxide (MAG-OX) 400 MG tablet Take 400 mg by mouth 2 (two) times daily.    montelukast (SINGULAIR) 10 MG tablet TAKE 1 TABLET BY MOUTH AT BEDTIME FOR ALLERGIES   omeprazole (PRILOSEC) 40 MG capsule TAKE 1 CAPSULE DAILY TO PREVENT HEARTBURN & INDIGESTION /  PATIENT KNOWS TO TAKE BY MOUTH   ondansetron (ZOFRAN-ODT) 8 MG disintegrating tablet DISSOLVE 1 TABLET UNDER TONGUE EVERY 6 TO 8 HOURS FOR NAUSEA OR VOMITTING   Riboflavin 100 MG TABS Take 100 mg by mouth. bid   rosuvastatin (CRESTOR) 40 MG tablet TAKE 1 TABLET BY MOUTH EVERY OTHER DAY FOR CHOLESTEROL   traZODone (DESYREL) 150 MG tablet Take  1/2 to 1 tablet   1 to 2 hours   before Bedtime if Needed forSleep  ( Replaces Xanax )   UBRELVY 100 MG TABS TAKE 1 TABLET BY MOUTH AS NEEDED AS DIRECTED   verapamil (CALAN-SR) 240 MG CR  tablet TAKE 1 TABLET DAILY WITH FOOD FOR BLOOD PRESSURE & MIGRAINE PREVENTION   vitamin B-12 (CYANOCOBALAMIN) 1000 MCG tablet Take 1,000 mcg by mouth daily.   Vitamin D, Ergocalciferol, (DRISDOL) 1.25 MG (50000 UNIT) CAPS capsule Take 1 capsule three days a week for Vitamin D deficiency.   zinc gluconate 50 MG tablet Take 50 mg by mouth daily.   No current facility-administered medications on file prior to visit.    ROS: all negative except above.   Physical Exam:  There were no vitals taken for this visit.  General Appearance: Well nourished, in no apparent distress. Eyes: PERRLA, EOMs, conjunctiva no swelling or erythema Sinuses: No Frontal/maxillary tenderness ENT/Mouth: Ext aud canals clear, TMs without erythema, bulging. No erythema, swelling, or exudate on post pharynx.  Tonsils not swollen or erythematous. Hearing normal.  Neck: Supple, thyroid normal.  Respiratory: Respiratory effort normal, BS equal bilaterally without rales, rhonchi, wheezing or stridor.  Cardio: RRR with no MRGs. Brisk peripheral pulses without edema.  Abdomen: Soft, + BS.  Non tender, no guarding, rebound, hernias, masses. Lymphatics: Non tender without lymphadenopathy.  Musculoskeletal: Full ROM, 5/5 strength, normal gait.  Skin: Warm, dry without rashes, lesions, ecchymosis.  Neuro: Cranial nerves intact. Normal muscle tone, no cerebellar symptoms. Sensation intact.  Psych: Awake and oriented X 3, normal affect, Insight and Judgment appropriate.     Raynelle Dick, NP 12:13 PM [facility]

## 2023-02-16 NOTE — Progress Notes (Signed)
Botox- 100 units x 3 vial Lot: [id number] Expiration: 05/2025 NDC: 2951-8841-66  Bacteriostatic 0.9% Sodium Chloride- * mL  Lot: [id number] Expiration: 06/28/2023 NDC: 0109-3235-57  Dx: D22.025 B/B Witnessed by Cheron Every, CMA

## 2023-02-17 ENCOUNTER — Ambulatory Visit: Payer: Medicare Other | Admitting: Nurse Practitioner

## 2023-02-22 ENCOUNTER — Other Ambulatory Visit: Payer: Self-pay | Admitting: Neurology

## 2023-03-04 ENCOUNTER — Other Ambulatory Visit: Payer: Self-pay | Admitting: Internal Medicine

## 2023-03-04 DIAGNOSIS — Z1231 Encounter for screening mammogram for malignant neoplasm of breast: Secondary | ICD-10-CM

## 2023-03-26 ENCOUNTER — Other Ambulatory Visit: Payer: Self-pay | Admitting: Nurse Practitioner

## 2023-04-05 ENCOUNTER — Ambulatory Visit: Payer: Medicare Other

## 2023-04-14 ENCOUNTER — Other Ambulatory Visit: Payer: Self-pay | Admitting: Neurology

## 2023-04-15 ENCOUNTER — Ambulatory Visit
Admission: RE | Admit: 2023-04-15 | Discharge: 2023-04-15 | Disposition: A | Discharge status: Home / Self Care | Payer: Medicare Other | Source: Ambulatory Visit | Attending: Internal Medicine | Admitting: Internal Medicine

## 2023-04-15 DIAGNOSIS — Z1231 Encounter for screening mammogram for malignant neoplasm of breast: Secondary | ICD-10-CM | POA: Diagnosis not present

## 2023-04-21 ENCOUNTER — Other Ambulatory Visit: Payer: Self-pay | Admitting: Nurse Practitioner

## 2023-04-21 ENCOUNTER — Other Ambulatory Visit: Payer: Self-pay | Admitting: Internal Medicine

## 2023-04-29 ENCOUNTER — Ambulatory Visit: Payer: Medicare Other | Admitting: Nurse Practitioner

## 2023-05-02 ENCOUNTER — Telehealth: Payer: Self-pay

## 2023-05-02 NOTE — Telephone Encounter (Signed)
Call to patient who reports ongoing headaches. She is using the [place] as needed and fioricet as needed/ She has a sample of Ajovy but not sure when it expires. It is not documented in mediation list. When she comes for her Botox visit, she wants to discuss anxiety. Her mother in law passed last weeks as well as her PCP. VV offered today but patient declined offer.

## 2023-05-02 NOTE — Telephone Encounter (Signed)
Patient would like a call to discuss upcoming Botox appointment. She reports more frequent migraines. She would like to focus more on the head during upcoming appointment. She is having increasing anxiety and would like to know if it is post stroke related.  She would also like to discuss finding a new PCP (hers passed away) that Dr. Terrace Arabia recommends.

## 2023-05-10 DIAGNOSIS — H2513 Age-related nuclear cataract, bilateral: Secondary | ICD-10-CM | POA: Diagnosis not present

## 2023-05-10 DIAGNOSIS — H524 Presbyopia: Secondary | ICD-10-CM | POA: Diagnosis not present

## 2023-05-11 NOTE — Progress Notes (Unsigned)
Office Visit Note   Patient: Ruth Gregory           Date of Birth: November 07, 1960           MRN: [id number] Visit Date: 05/12/2023              Requested by: Lucky Cowboy, MD [address] [place],  [place] [address] PCP: Lucky Cowboy, MD   Assessment & Plan: Visit Diagnoses: No diagnosis found.  Plan: ***  Follow-Up Instructions: No follow-ups on file.   Orders:  No orders of the defined types were placed in this encounter.  No orders of the defined types were placed in this encounter.     Procedures: No procedures performed   Clinical Data: No additional findings.   Subjective: No chief complaint on file.   HPI  Review of Systems   Objective: Vital Signs: There were no vitals taken for this visit.  Physical Exam  Ortho Exam  Specialty Comments:  No specialty comments available.  Imaging: No results found.   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 09/11/2021   Arthritis of carpometacarpal [facility]) joint of right thumb 08/11/2021   Degenerative arthritis of distal interphalangeal joint of index finger of right hand 08/11/2021   Degenerative arthritis of distal interphalangeal joint of middle finger of right hand 08/11/2021   Degenerative arthritis of distal interphalangeal joint of ring finger of right hand 08/11/2021   Partial symptomatic epilepsy with complex partial seizures, not intractable, without status epilepticus (HCC) 01/21/2021   B12 deficiency 09/18/2020   Chronic migraine w/o aura w/o status migrainosus, not intractable 04/15/2020   Spastic hemiplegia of left nondominant side as late effect of cerebrovascular  disease (HCC) 06/18/2019   Abnormal glucose 07/27/2018   Labile hypertension 07/27/2018   Fatty liver 10/09/2017   GERD (gastroesophageal reflux disease) 10/07/2017   Anxiety 10/07/2017   Hypothyroidism 07/23/2015   Vitamin D deficiency 10/03/2013   Medication management 10/03/2013   History of prediabetes 10/03/2013   ADD (attention deficit disorder) without hyperactivity 10/03/2013   Hyperlipidemia, mixed 04/04/2013   Sinusitis, chronic 04/04/2013   Migraine 09/06/2012   Spastic hemiplegia affecting left nondominant side (HCC) 09/06/2012   History of CVA (cerebrovascular accident)    Past Medical History:  Diagnosis Date   Anemia    Anxiety    Asthma    Chronic heartburn    Depression    Elevated cholesterol    on Crestor   Headache(784.0)    Hypertension    Hypothyroidism    S/P thyroidectomy   Migraine    Occlusion and stenosis of carotid artery with cerebral infarction    Spasm of muscle    Unspecified cerebral artery occlusion with cerebral infarction    Vitamin D deficiency     Family History  Problem Relation Age of Onset   Heart disease Mother    Peptic Ulcer Disease Mother    Heart disease Father    Parkinson's disease Father     Past Surgical History:  Procedure Laterality Date   AUGMENTATION MAMMAPLASTY Bilateral 2000   COSMETIC SURGERY     CYSTOCELE REPAIR N/A 01/08/2013   Procedure: ANTERIOR REPAIR (CYSTOCELE);  Surgeon: Meriel Pica, MD;  Location: [facility];  Service: Gynecology;  Laterality: N/A;   NASAL SINUS SURGERY     VAGINAL HYSTERECTOMY     Social History   Occupational History   Occupation: DESIGNER    Employer: [employer]  Tobacco Use   Smoking status: Never   Smokeless tobacco: Never  Substance and Sexual Activity   Alcohol use: Yes    Comment: occassionally drinks a glass of wine   Drug use: No   Sexual activity: Not on file

## 2023-05-12 ENCOUNTER — Ambulatory Visit: Payer: Medicare Other | Admitting: Orthopaedic Surgery

## 2023-05-18 ENCOUNTER — Ambulatory Visit: Payer: Medicare Other | Admitting: Neurology

## 2023-05-19 ENCOUNTER — Ambulatory Visit: Payer: Medicare Other | Admitting: Neurology

## 2023-05-25 ENCOUNTER — Ambulatory Visit: Payer: Medicare Other | Admitting: Neurology

## 2023-05-25 VITALS — BP 130/88

## 2023-05-25 DIAGNOSIS — I69954 Hemiplegia and hemiparesis following unspecified cerebrovascular disease affecting left non-dominant side: Secondary | ICD-10-CM

## 2023-05-25 DIAGNOSIS — Z8673 Personal history of transient ischemic attack (TIA), and cerebral infarction without residual deficits: Secondary | ICD-10-CM

## 2023-05-25 DIAGNOSIS — K5900 Constipation, unspecified: Secondary | ICD-10-CM | POA: Diagnosis not present

## 2023-05-25 DIAGNOSIS — K219 Gastro-esophageal reflux disease without esophagitis: Secondary | ICD-10-CM | POA: Diagnosis not present

## 2023-05-25 DIAGNOSIS — K649 Unspecified hemorrhoids: Secondary | ICD-10-CM | POA: Diagnosis not present

## 2023-05-25 DIAGNOSIS — G43709 Chronic migraine without aura, not intractable, without status migrainosus: Secondary | ICD-10-CM

## 2023-05-25 MED ORDER — ONABOTULINUMTOXINA 100 UNITS IJ SOLR
300.0000 [IU] | Freq: Once | INTRAMUSCULAR | Status: AC
  Administered 2023-05-25: 300 [IU] via INTRAMUSCULAR

## 2023-05-25 NOTE — Progress Notes (Signed)
  Spastic left hemiparesis following right internal carotid artery dissection, right MCA stroke in June 2000   Botulism toxin injection every 3 months for spastic left  hemiparesis injection  Under EMG,  injected Botox A 300 units  Left pronator teres 25 units Left palmaris longus 25 units Left flexor digitorum profundus 25 units Right levator scapula 25 units  Left pectoralis major 50 units Left latissimus dorsii 50 units  Left flexor digitorum longus 50 units Left tibialis posterior 50 units    Levert Feinstein, M.D. Ph.D.  [facility] [address] [place], [place] [address] Ph: [phone] Fax: [phone]

## 2023-05-25 NOTE — Progress Notes (Signed)
 Botox- 100 units x 3 vials Lot: [id number] Expiration: 07/2025 NDC: 4540-9811-91  Bacteriostatic 0.9% Sodium Chloride- 6mL  Lot: [id number] Expiration: 01/28/24 NDC: 6213086578  Dx: I69.629   B/B Witnessed by Adriana Simas CMA

## 2023-05-31 ENCOUNTER — Other Ambulatory Visit: Payer: Self-pay | Admitting: Nurse Practitioner

## 2023-05-31 DIAGNOSIS — F419 Anxiety disorder, unspecified: Secondary | ICD-10-CM

## 2023-06-01 ENCOUNTER — Other Ambulatory Visit: Payer: Self-pay

## 2023-06-01 DIAGNOSIS — F988 Other specified behavioral and emotional disorders with onset usually occurring in childhood and adolescence: Secondary | ICD-10-CM

## 2023-06-01 MED ORDER — AMPHETAMINE-DEXTROAMPHETAMINE 10 MG PO TABS
ORAL_TABLET | ORAL | 0 refills | Status: DC
Start: 2023-06-01 — End: 2023-10-27

## 2023-06-01 MED ORDER — ALPRAZOLAM 0.5 MG PO TABS
ORAL_TABLET | ORAL | 0 refills | Status: DC
Start: 2023-06-01 — End: 2024-01-02

## 2023-07-12 ENCOUNTER — Telehealth: Payer: Self-pay | Admitting: Orthopaedic Surgery

## 2023-07-12 NOTE — Telephone Encounter (Signed)
 Patient needs to make an appointment, its been a year since we saw patient. Tried to call. No answer. No voicemail.

## 2023-07-12 NOTE — Telephone Encounter (Signed)
 Patient called and ask if you can refill on prednisone. ZD#[phone]

## 2023-07-19 ENCOUNTER — Telehealth: Payer: Self-pay | Admitting: Neurology

## 2023-07-19 NOTE — Telephone Encounter (Signed)
 Submitted PA request via [employer] portal, status is pending clinical review. Case#: [id number]

## 2023-07-23 IMAGING — MG DIGITAL SCREENING BREAST BILAT IMPLANT W/ TOMO W/ CAD
8 of 12 series · 8 of 28 positions shown · non-contrast
Comparison: Previous exam(s).

CLINICAL DATA: Screening.

EXAM:
DIGITAL SCREENING BILATERAL MAMMOGRAM WITH IMPLANTS, CAD AND
TOMOSYNTHESIS
TECHNIQUE: Bilateral screening digital craniocaudal and mediolateral oblique
mammograms were obtained. Bilateral screening digital breast
tomosynthesis was performed. The images were evaluated with
computer-aided detection. Standard and/or implant displaced views
were performed.

[R CC]
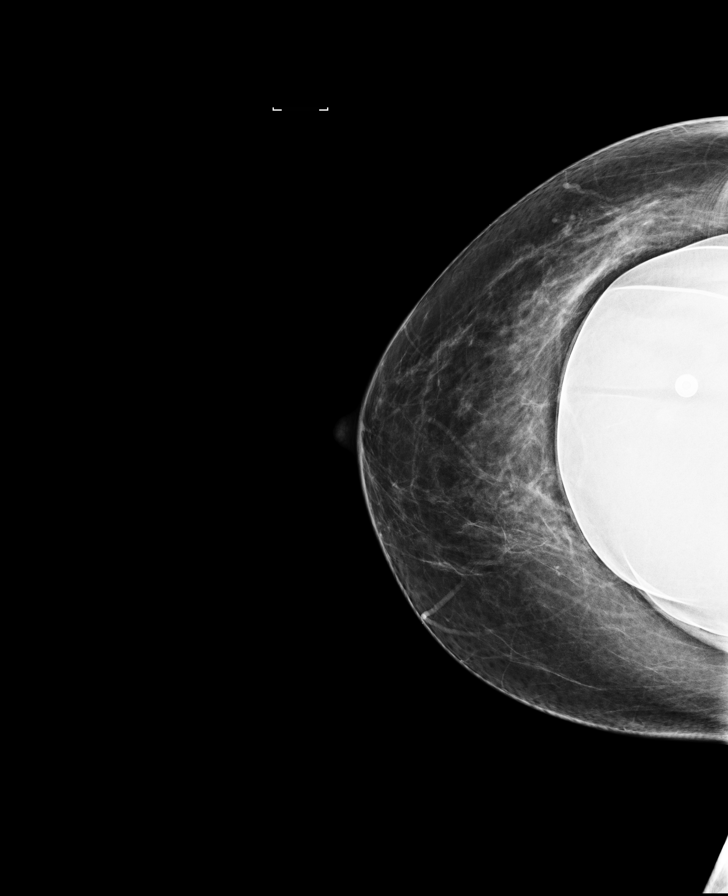

[L MLO]
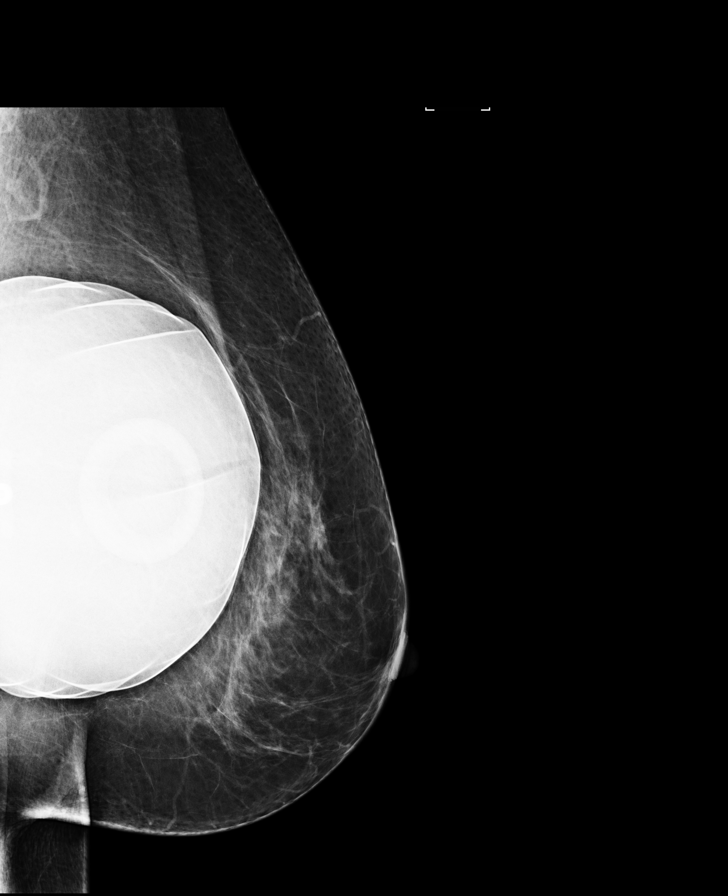

[R MLO]
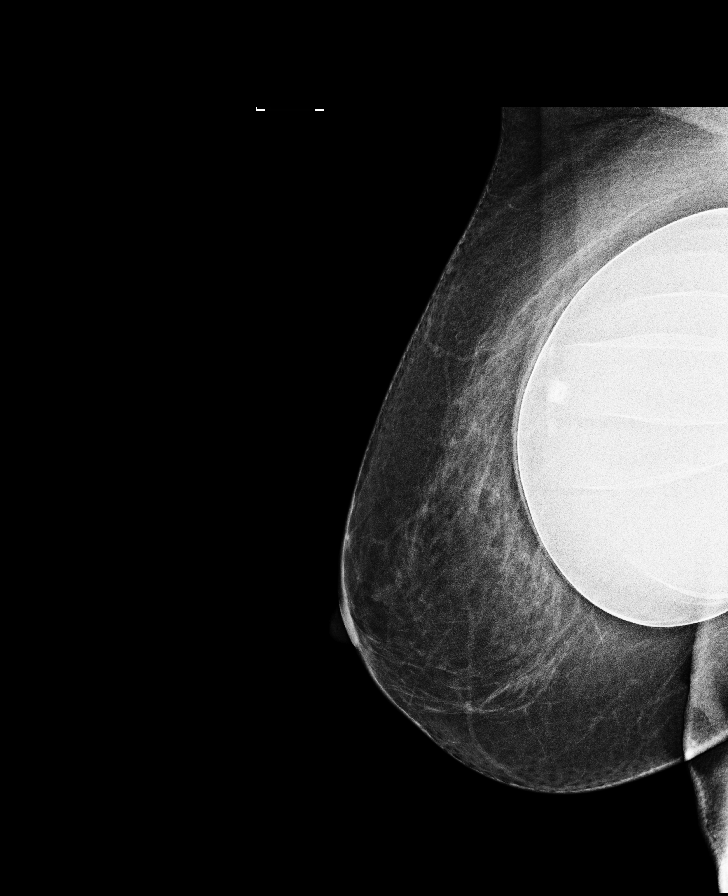

[L CC]
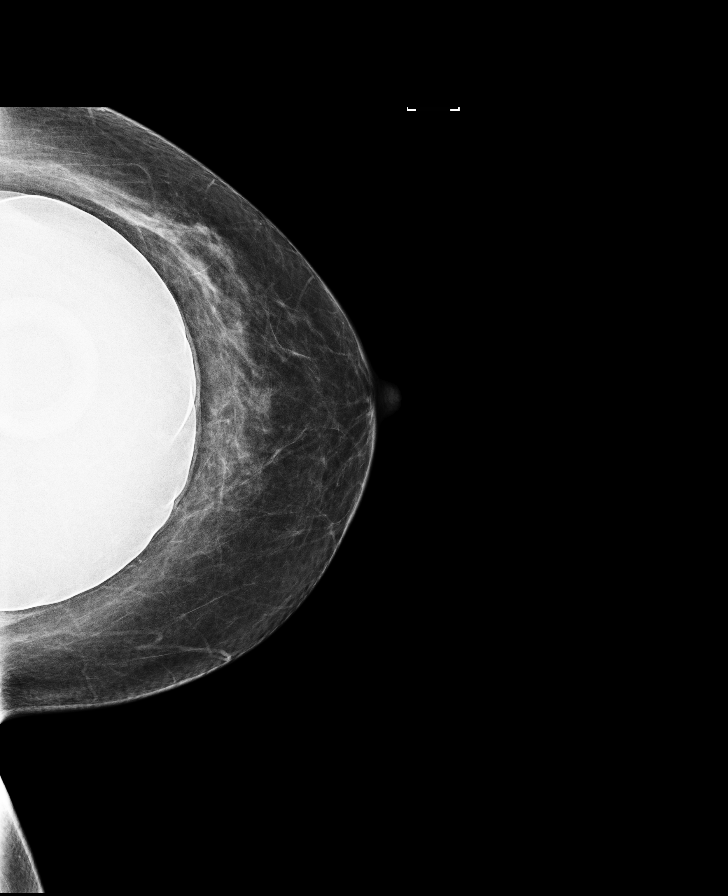

[L MLO synth-2D]
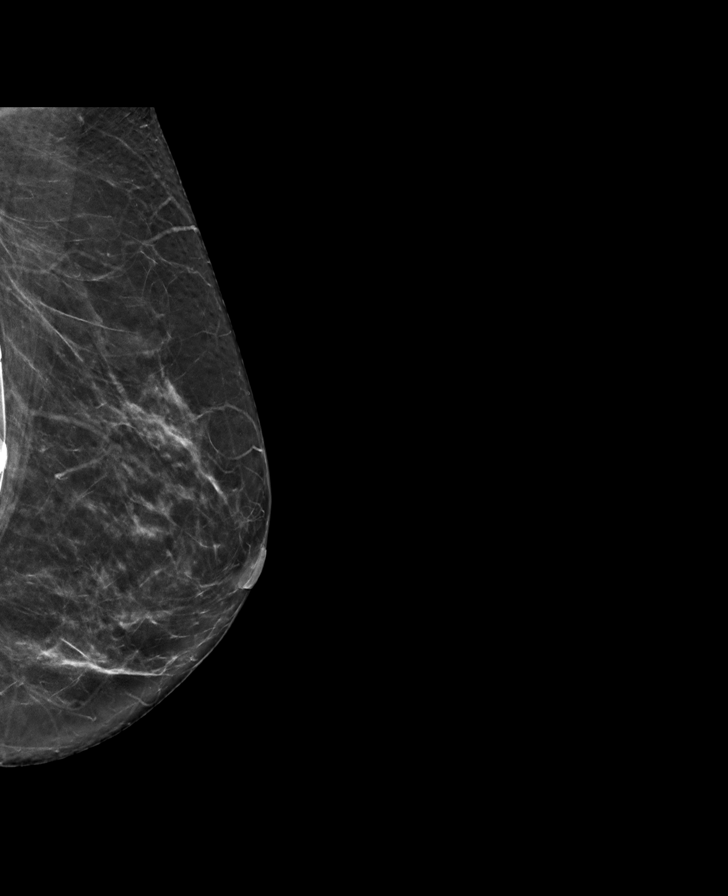

[L CC synth-2D]
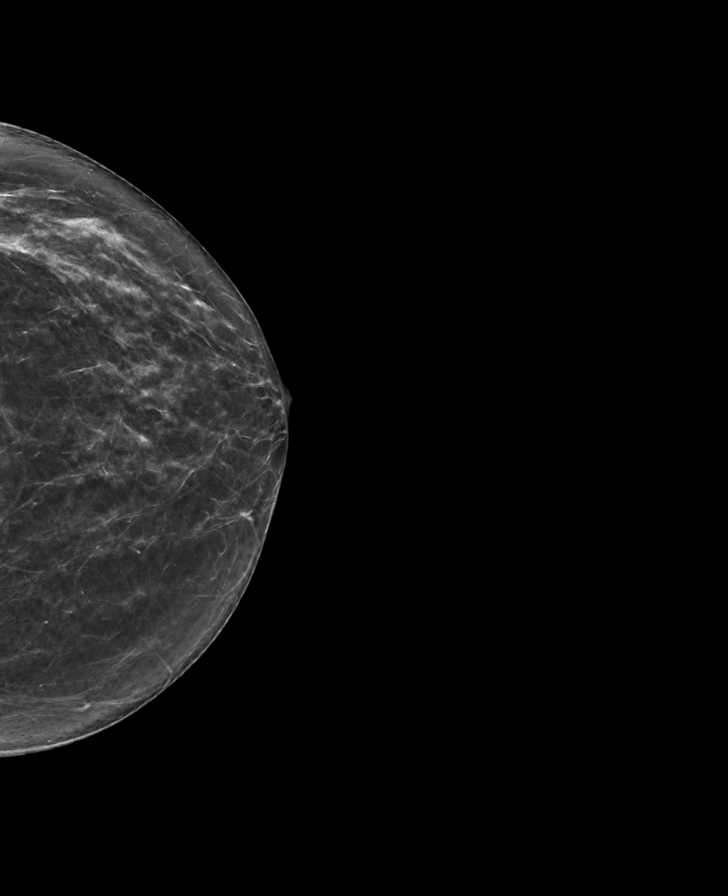

[R MLO synth-2D]
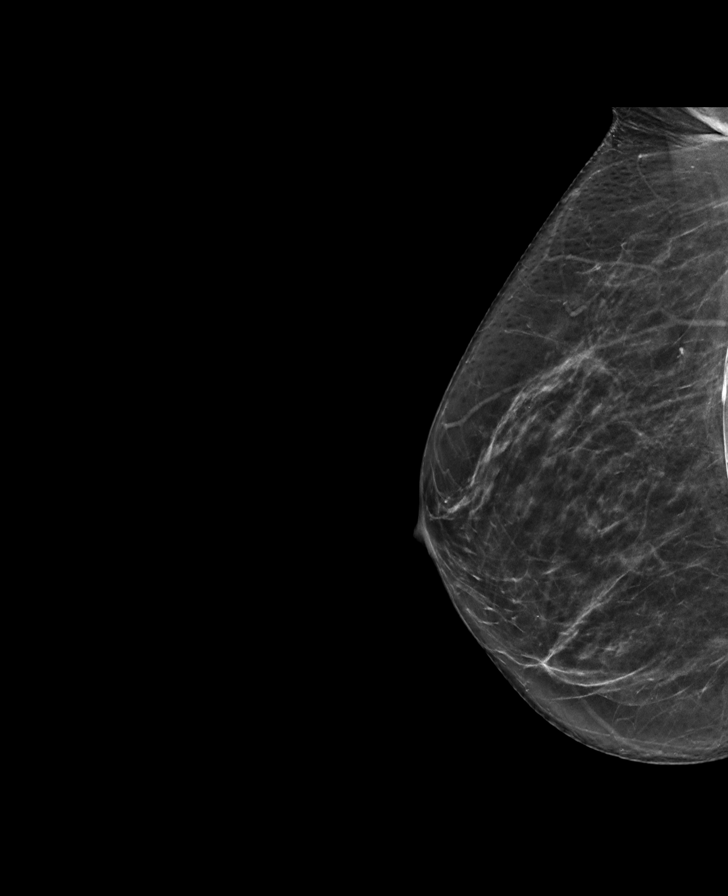

[R CC synth-2D]
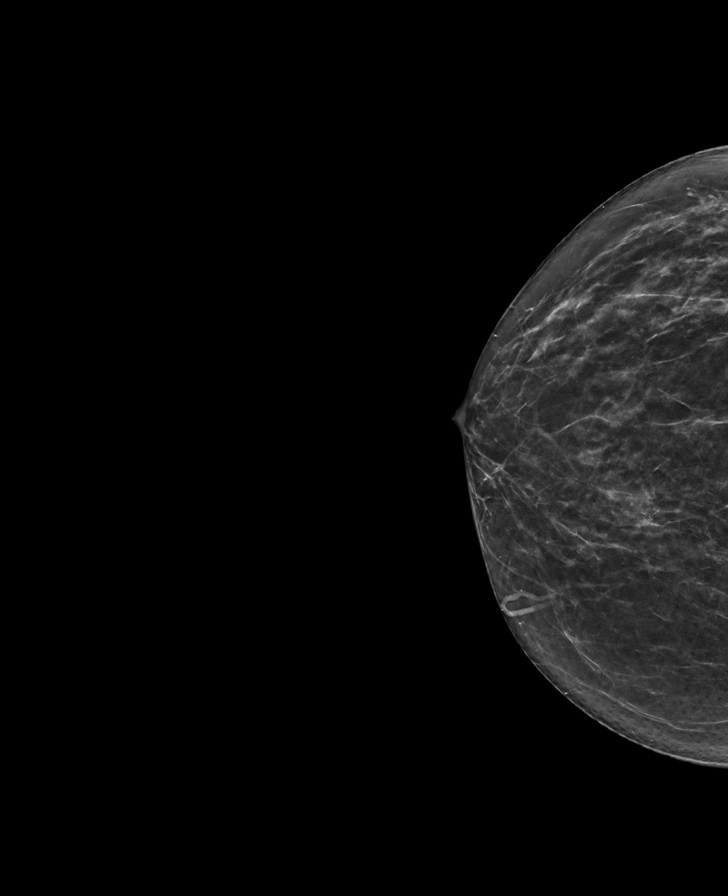

[8 of 28 positions shown; findings below may reference images not displayed]

ACR Breast Density Category b: There are scattered areas of
fibroglandular density.
FINDINGS: The patient has retropectoral implants. There are no findings
suspicious for malignancy.
IMPRESSION: No mammographic evidence of malignancy. A result letter of this
screening mammogram will be mailed directly to the patient.

RECOMMENDATION:
Screening mammogram in one year. (Code:SE-S-JMG)

BI-RADS CATEGORY  1:  Negative.

## 2023-07-25 NOTE — Telephone Encounter (Signed)
 Auth was approved, pt will continue to be buy/bill. Auth#: [id number] (07/19/23-07/18/24)

## 2023-07-26 ENCOUNTER — Ambulatory Visit: Admitting: Orthopaedic Surgery

## 2023-07-29 ENCOUNTER — Ambulatory Visit: Admitting: Orthopaedic Surgery

## 2023-08-05 ENCOUNTER — Telehealth: Payer: Self-pay

## 2023-08-05 NOTE — Telephone Encounter (Signed)
 Spoke with the patient and informed her a visit is needed prior to labs being drawn and she is a new patient.  Patient was advised if Dr Bambi Lever orders any tests, she will be sent over to the lab here after the visit.

## 2023-08-05 NOTE — Telephone Encounter (Signed)
 Copied from CRM [id number]. Topic: Clinical - Request for Lab/Test Order >> Aug 05, 2023  4:14 PM Elle L wrote: Reason for CRM: The patient states she has to take a toe nail fungus medication and she is requesting to see if lab orders can be put in to check her liver and sugar levels before her new patient appointment on 5/30. I confirmed she is not currently having symptoms. The patient's call back number is [phone].

## 2023-08-06 ENCOUNTER — Other Ambulatory Visit: Payer: Self-pay | Admitting: Neurology

## 2023-08-08 ENCOUNTER — Ambulatory Visit: Payer: Medicare Other | Admitting: Internal Medicine

## 2023-08-12 LAB — LAB REPORT - SCANNED
Creatinine, POC: 44.1 mg/dL
EGFR: 79

## 2023-08-17 ENCOUNTER — Ambulatory Visit: Payer: Medicare Other | Admitting: Neurology

## 2023-08-17 ENCOUNTER — Encounter: Payer: Self-pay | Admitting: Neurology

## 2023-08-17 VITALS — BP 130/80 | Ht 69.0 in | Wt 158.0 lb

## 2023-08-17 DIAGNOSIS — G43709 Chronic migraine without aura, not intractable, without status migrainosus: Secondary | ICD-10-CM

## 2023-08-17 DIAGNOSIS — I69954 Hemiplegia and hemiparesis following unspecified cerebrovascular disease affecting left non-dominant side: Secondary | ICD-10-CM

## 2023-08-17 DIAGNOSIS — Z8673 Personal history of transient ischemic attack (TIA), and cerebral infarction without residual deficits: Secondary | ICD-10-CM

## 2023-08-17 MED ORDER — ONABOTULINUMTOXINA 100 UNITS IJ SOLR: 300.0000 [IU] | Freq: Once | INTRAMUSCULAR | Status: AC

## 2023-08-17 NOTE — Progress Notes (Signed)
 Botox - 100 units x 3 vials Lot: [id number] Expiration: 07/2025 NDC: 0630-1601-09  Bacteriostatic 0.9% Sodium Chloride - 6 mL  Lot: [id number] Expiration: 01/28/2024 NDC: 3220-2542-70  Dx: W23.762 S/P  Witnessed by Kenneth Peace

## 2023-08-17 NOTE — Progress Notes (Signed)
  Spastic left hemiparesis following right internal carotid artery dissection, right MCA stroke in June 2000   Botulism toxin injection every 3 months for spastic left  hemiparesis injection  Under EMG,  injected Botox  A 300 units  Left pronator teres 25 units Left palmaris longus 25 units Left flexor digitorum profundus 25 units   Right splenius cervix 25 units Right splenius capitis 25 units Right levator scapula 25 units  Left pectoralis major 25 units Left latissimus dorsii 25 units  Left flexor digitorum longus 50 units Left tibialis posterior 50 units    Phebe Brasil, M.D. Ph.D.  [facility] [address] [place], [place] [address] Ph: [phone] Fax: [phone]

## 2023-08-26 ENCOUNTER — Ambulatory Visit: Admitting: Family Medicine

## 2023-09-06 ENCOUNTER — Ambulatory Visit: Admitting: Family Medicine

## 2023-09-13 ENCOUNTER — Telehealth: Payer: Self-pay | Admitting: Neurology

## 2023-09-13 DIAGNOSIS — I69954 Hemiplegia and hemiparesis following unspecified cerebrovascular disease affecting left non-dominant side: Secondary | ICD-10-CM

## 2023-09-13 DIAGNOSIS — G43709 Chronic migraine without aura, not intractable, without status migrainosus: Secondary | ICD-10-CM

## 2023-09-13 NOTE — Telephone Encounter (Signed)
 Pt called wanting to speak to the nurse regarding some sx she is having. She is not sure if it has to do with her dx so she would like to discuss before requesting a new referral due to another dx. Please advise.

## 2023-09-14 ENCOUNTER — Encounter: Payer: Self-pay | Admitting: Family Medicine

## 2023-09-14 ENCOUNTER — Ambulatory Visit (INDEPENDENT_AMBULATORY_CARE_PROVIDER_SITE_OTHER): Admitting: Family Medicine

## 2023-09-14 ENCOUNTER — Other Ambulatory Visit: Payer: Self-pay | Admitting: Family Medicine

## 2023-09-14 VITALS — BP 118/80 | HR 90 | Temp 97.8°F | Ht 67.5 in | Wt 155.4 lb

## 2023-09-14 DIAGNOSIS — E559 Vitamin D deficiency, unspecified: Secondary | ICD-10-CM

## 2023-09-14 DIAGNOSIS — E039 Hypothyroidism, unspecified: Secondary | ICD-10-CM

## 2023-09-14 DIAGNOSIS — E782 Mixed hyperlipidemia: Secondary | ICD-10-CM | POA: Diagnosis not present

## 2023-09-14 DIAGNOSIS — J3089 Other allergic rhinitis: Secondary | ICD-10-CM | POA: Diagnosis not present

## 2023-09-14 DIAGNOSIS — F419 Anxiety disorder, unspecified: Secondary | ICD-10-CM

## 2023-09-14 MED ORDER — MONTELUKAST SODIUM 10 MG PO TABS
ORAL_TABLET | ORAL | 1 refills | Status: DC
Start: 2023-09-14 — End: 2023-12-29

## 2023-09-14 MED ORDER — VITAMIN D (ERGOCALCIFEROL) 1.25 MG (50000 UNIT) PO CAPS
50000.0000 [IU] | ORAL_CAPSULE | ORAL | 1 refills | Status: DC
Start: 2023-09-14 — End: 2023-09-15

## 2023-09-14 MED ORDER — LEVOTHYROXINE SODIUM 88 MCG PO TABS
ORAL_TABLET | ORAL | 3 refills | Status: AC
Start: 2023-09-14 — End: ?

## 2023-09-14 MED ORDER — ROSUVASTATIN CALCIUM 40 MG PO TABS: ORAL_TABLET | ORAL | 3 refills | Status: AC

## 2023-09-14 NOTE — Assessment & Plan Note (Signed)
 History of, she is taking her supplements about twice a month per her report. Will refill these for her and her last level was WNL. Continue as she is taking it.

## 2023-09-14 NOTE — Progress Notes (Signed)
 New Patient Office Visit  Subjective   Patient ID: Ruth Gregory, female    DOB: Oct 23, 1960  Age: 63 y.o. MRN: [id number]  CC:  Chief Complaint  Patient presents with   Establish Care    HPI Ruth Gregory presents to establish care She was a former patient of Dr. Cassondra Cliff who recently passed away. She reports a history of CVA in the past with residual symptoms on the left, hypothyroidism, hypertension, migraine disorder, ADHD and anxiety. I reviewed her prescriptions and she is on alprazolam  as needed for panic attacks (gets 1 every 3-4 months) and also taking adderall. States Dr. Cassondra Cliff had also diagnosed her with ADHD, states that a lot of her symptoms come after her stroke 15 years ago. States that she also has seizure disorder and is on lamotrigine  for this as well. Sees her neurologist regularly for this and her migraine disorder-- reviewed notes from Dr. Gracie Lav.   I reviewed her medications, past medical history, etc with the patient today. I also reviewed her last set of labs from October 2024 and previous notes from Dr. Melene Sportsman office.   Current Outpatient Medications  Medication Instructions   albuterol  (PROVENTIL  HFA) 108 (90 Base) MCG/ACT inhaler 1 to 2 inhalations 10-15 minutes apart every 4 hours if needed for asthma rescue   ALPRAZolam  (XANAX ) 0.5 MG tablet TAKE 1/2-1 TABS BY MOUTH AT BEDTIME AS NEEDED FOR SLEEP&MAX 5 DAYS/WEEK TO AVOID ADDICTION&DEMENTIA   amphetamine -dextroamphetamine  (ADDERALL) 10 MG tablet Take  1/2 - 1 tablet  1 or 2 x /day  for ADD   Botox  300 Units, Every 3 months   buPROPion  (WELLBUTRIN  XL) 150 MG 24 hr tablet TAKE 1 TABLET EVERY MORNING FOR MOOD. FOCUS & CONCENTRATION   butalbital -acetaminophen -caffeine  (FIORICET) 50-325-40 MG tablet 1 tablet, Oral, Daily PRN, Do not refill in less than 30 days,   cetirizine (ZYRTEC) 10 mg, Daily   cyanocobalamin  (VITAMIN B12) 1,000 mcg, Daily   Estradiol  10 mcg, 2 times weekly   fenofibrate  micronized  (LOFIBRA) 134 MG capsule TAKE 1 CAPSULE DAILY FOR TRIGLYCERIDES (BLOOD FATS)   fluticasone  furoate-vilanterol (BREO ELLIPTA ) 100-25 MCG/ACT AEPB Use  1 Inhalation Daily   hydrocortisone (ANUSOL-HC) 2.5 % rectal cream 2 times daily   lamoTRIgine  (LAMICTAL ) 100 mg, Oral, 2 times daily   levothyroxine  (SYNTHROID ) 88 MCG tablet TAKE 1 TABLET DAILY ON AN EMPTY STOMACH WITH ONLY WATER FOR 30 MINS& NO ANTACID MEDS, CALCIUM , OR MAGNESIUM FOR 4 HOURS. AVOID BIOTIN   magnesium oxide (MAG-OX) 400 mg, 2 times daily   montelukast  (SINGULAIR ) 10 MG tablet TAKE 1 TABLET BY MOUTH AT BEDTIME FOR ALLERGIES   omeprazole  (PRILOSEC) 40 MG capsule TAKE 1 CAPSULE DAILY TO PREVENT HEARTBURN & INDIGESTION / PATIENT KNOWS TO TAKE BY MOUTH   ondansetron  (ZOFRAN -ODT) 8 MG disintegrating tablet DISSOLVE 1 TABLET UNDER TONGUE EVERY 6 TO 8 HOURS FOR NAUSEA OR VOMITTING   Riboflavin 100 mg   rosuvastatin  (CRESTOR ) 40 MG tablet TAKE 1 TABLET BY MOUTH EVERY OTHER DAY FOR CHOLESTEROL   traZODone  (DESYREL ) 150 MG tablet Take  1/2 to 1 tablet   1 to 2 hours   before Bedtime if Needed forSleep  ( Replaces Xanax  )   UBRELVY  100 MG TABS TAKE 1 TABLET BY MOUTH AS NEEDED AS DIRECTED   verapamil  (CALAN -SR) 240 MG CR tablet TAKE 1 TABLET DAILY WITH FOOD FOR BLOOD PRESSURE & MIGRAINE PREVENTION   Vitamin D  (Ergocalciferol ) (DRISDOL ) 50,000 Units, Oral, Every 7 days, Take 1 capsule three  days a week for Vitamin D  deficiency.   zinc gluconate 50 mg, Daily    Past Medical History:  Diagnosis Date   Anemia    Anxiety    Asthma    Chronic heartburn    Depression    Elevated cholesterol    on Crestor    Headache(784.0)    Hypertension    Hypothyroidism    S/P thyroidectomy   Migraine    Occlusion and stenosis of carotid artery with cerebral infarction    Spasm of muscle    Unspecified cerebral artery occlusion with cerebral infarction    Vitamin D  deficiency     Past Surgical History:  Procedure Laterality Date   AUGMENTATION  MAMMAPLASTY Bilateral 2000   COSMETIC SURGERY     CYSTOCELE REPAIR N/A 01/08/2013   Procedure: ANTERIOR REPAIR (CYSTOCELE);  Surgeon: Piedad Brewer, MD;  Location: [facility];  Service: Gynecology;  Laterality: N/A;   NASAL SINUS SURGERY     VAGINAL HYSTERECTOMY      Family History  Problem Relation Age of Onset   Heart disease Mother    Peptic Ulcer Disease Mother    Heart disease Father    Parkinson's disease Father     Social History   Socioeconomic History   Marital status: Married    Spouse name: Siegfried Dress   Number of children: 2   Years of education: college   Highest education level: Not on file  Occupational History   Occupation: DESIGNER    Employer: SELF  Tobacco Use   Smoking status: Never   Smokeless tobacco: Never  Substance and Sexual Activity   Alcohol use: Yes    Comment: occassionally drinks a glass of wine   Drug use: No   Sexual activity: Not on file  Other Topics Concern   Not on file  Social History Narrative   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 Patient is married and lives with her husband. Patient has college education.   Right handed.   Caffeine - two cups daily.   Social Drivers of Corporate investment banker Strain: Not on file  Food Insecurity: Not on file  Transportation Needs: Not on file  Physical Activity: Not on file  Stress: Not on file  Social Connections: Not on file  Intimate Partner Violence: Not on file    Review of Systems  All other systems reviewed and are negative.       Objective   BP 118/80   Pulse 90   Temp 97.8 F (36.6 C) (Oral)   Ht 5' 7.5 (1.715 m)   Wt 155 lb 6.4 oz (70.5 kg)   SpO2 98%   BMI 23.98 kg/m   Physical Exam Vitals reviewed.  Constitutional:      Appearance: Normal appearance. She is well-groomed and normal weight.   Eyes:     Conjunctiva/sclera: Conjunctivae normal.   Neck:     Thyroid : No thyromegaly.   Cardiovascular:     Rate and Rhythm: Normal rate and  regular rhythm.     Pulses: Normal pulses.     Heart sounds: S1 normal and S2 normal.  Pulmonary:     Effort: Pulmonary effort is normal.     Breath sounds: Normal breath sounds and air entry.  Abdominal:     General: Bowel sounds are normal.   Musculoskeletal:     Right lower leg: No edema.     Left lower leg: No edema.   Neurological:  Mental Status: She is alert and oriented to person, place, and time. Mental status is at baseline.     Motor: Weakness (generalized left side of body, this is chronic and residual from her remote CVA) present.     Gait: Gait is intact.   Psychiatric:        Mood and Affect: Mood and affect normal.        Speech: Speech normal.        Behavior: Behavior normal.        Judgment: Judgment normal.     Last CBC Lab Results  Component Value Date   WBC 5.9 01/21/2023   HGB 14.9 01/21/2023   HCT 44.7 01/21/2023   MCV 94.3 01/21/2023   MCH 31.4 01/21/2023   RDW 12.5 01/21/2023   PLT 205 01/21/2023   Last metabolic panel Lab Results  Component Value Date   GLUCOSE 102 01/21/2023   NA 140 01/21/2023   K 4.3 01/21/2023   CL 105 01/21/2023   CO2 29 01/21/2023   BUN 19 01/21/2023   CREATININE 0.85 01/21/2023   EGFR 79.0 08/12/2023   CALCIUM  10.0 01/21/2023   PHOS 2.3 08/30/2007   PROT 6.9 01/21/2023   ALBUMIN 4.4 10/05/2016   BILITOT 0.4 01/21/2023   ALKPHOS 75 10/05/2016   AST 21 01/21/2023   ALT 24 01/21/2023   Last lipids Lab Results  Component Value Date   CHOL 166 01/21/2023   HDL 61 01/21/2023   LDLCALC 81 01/21/2023   TRIG 138 01/21/2023   CHOLHDL 2.7 01/21/2023   Last hemoglobin A1c Lab Results  Component Value Date   HGBA1C 6.0 (H) 01/21/2023   Last thyroid  functions Lab Results  Component Value Date   TSH 0.48 01/21/2023        Assessment & Plan:  Vitamin D  deficiency Assessment & Plan: History of, she is taking her supplements about twice a month per her report. Will refill these for her and her last  level was WNL. Continue as she is taking it.   Orders: -     Vitamin D  (Ergocalciferol ); Take 1 capsule (50,000 Units total) by mouth every 7 (seven) days. Take 1 capsule three days a week for Vitamin D  deficiency.  Dispense: 12 capsule; Refill: 1  Hypothyroidism, unspecified type Assessment & Plan: Last TSH reviewed, stable on the current dose, pt reports no symptoms of hypothyroid including weight gain, constipation or hair loss.  Orders: -     Levothyroxine  Sodium; TAKE 1 TABLET DAILY ON AN EMPTY STOMACH WITH ONLY WATER FOR 30 MINS& NO ANTACID MEDS, CALCIUM , OR MAGNESIUM FOR 4 HOURS. AVOID BIOTIN  Dispense: 90 tablet; Refill: 3  Hyperlipidemia, mixed Assessment & Plan: Needs refills today on her rosuvastatin , pt states she is taking them every other day, continue as prescribed, will check new lipid panel in October at her annual exam. Last lipid panel was reviewed with patient and well controlled.  Orders: -     Rosuvastatin  Calcium ; TAKE 1 TABLET BY MOUTH EVERY OTHER DAY FOR CHOLESTEROL  Dispense: 45 tablet; Refill: 3  Environmental and seasonal allergies -     Montelukast  Sodium; TAKE 1 TABLET BY MOUTH AT BEDTIME FOR ALLERGIES  Dispense: 90 tablet; Refill: 1  Anxiety Assessment & Plan: Pt is currently on PRN alprazolam , last filled 30 tablets in March, PDMP reviewed. I explained the risks/benefits of long term benzodiazepine use and cautioned that she use them sparingly. She is only taking about 1-2 tablets for panic attacks every 3-4  months. Will continue to monitor anxiety levels at each visit. We discussed referring her to a therapist for counseling and stress management techniques and she is agreeable.   Orders: -     Ambulatory referral to Psychology    Return in about 4 months (around 01/23/2024) for annual physical exam.   Aida House, MD

## 2023-09-14 NOTE — Assessment & Plan Note (Signed)
 Last TSH reviewed, stable on the current dose, pt reports no symptoms of hypothyroid including weight gain, constipation or hair loss.

## 2023-09-14 NOTE — Assessment & Plan Note (Signed)
 Pt is currently on PRN alprazolam , last filled 30 tablets in March, PDMP reviewed. I explained the risks/benefits of long term benzodiazepine use and cautioned that she use them sparingly. She is only taking about 1-2 tablets for panic attacks every 3-4 months. Will continue to monitor anxiety levels at each visit. We discussed referring her to a therapist for counseling and stress management techniques and she is agreeable.

## 2023-09-14 NOTE — Telephone Encounter (Signed)
 Patient reports pain and numbness in left arm that she describes as throbbing in her bone and radiates to the muscles. She states this is the same pain/symptoms she was having prior to starting the botox . She would like to know if she needs a visit or if Dr. Gracie Lav has any recommendations or medication that may help with this. She denies any new stroke symptoms.

## 2023-09-14 NOTE — Addendum Note (Signed)
 Addended by: Andy Moye on: 09/14/2023 05:44 PM   Modules accepted: Orders

## 2023-09-14 NOTE — Telephone Encounter (Signed)
 Orders Placed This Encounter  Procedures   NCV with EMG(electromyography)      I ordered EMG/NCS to check on her left arm nerves

## 2023-09-14 NOTE — Assessment & Plan Note (Signed)
 Needs refills today on her rosuvastatin , pt states she is taking them every other day, continue as prescribed, will check new lipid panel in October at her annual exam. Last lipid panel was reviewed with patient and well controlled.

## 2023-09-14 NOTE — Telephone Encounter (Signed)
 Call to patient, no answer. Left message to return call

## 2023-09-14 NOTE — Telephone Encounter (Signed)
Pt is returning call to RN.

## 2023-09-15 ENCOUNTER — Other Ambulatory Visit: Payer: Self-pay | Admitting: Neurology

## 2023-09-15 NOTE — Telephone Encounter (Signed)
 Spoke with patient and scheduled NCV/EMG for 11/11/23 at 9:30am and appointment has been added to wait list

## 2023-09-19 NOTE — Telephone Encounter (Signed)
 Pt is asking for a call from RN to discuss her symptoms in greater detail with RN

## 2023-09-20 ENCOUNTER — Telehealth: Payer: Self-pay

## 2023-09-20 NOTE — Telephone Encounter (Signed)
 Call to patient, she is in agreement to come tomorrow for NCV.

## 2023-09-21 ENCOUNTER — Ambulatory Visit: Admitting: Neurology

## 2023-09-21 ENCOUNTER — Encounter: Payer: Self-pay | Admitting: Neurology

## 2023-09-21 VITALS — BP 128/80 | Ht 69.0 in | Wt 155.0 lb

## 2023-09-21 DIAGNOSIS — I69954 Hemiplegia and hemiparesis following unspecified cerebrovascular disease affecting left non-dominant side: Secondary | ICD-10-CM | POA: Diagnosis not present

## 2023-09-21 DIAGNOSIS — G43709 Chronic migraine without aura, not intractable, without status migrainosus: Secondary | ICD-10-CM | POA: Diagnosis not present

## 2023-09-21 DIAGNOSIS — M79602 Pain in left arm: Secondary | ICD-10-CM | POA: Diagnosis not present

## 2023-09-21 MED ORDER — DULOXETINE HCL 30 MG PO CPEP
30.0000 mg | ORAL_CAPSULE | Freq: Every day | ORAL | 6 refills | Status: DC
Start: 2023-09-21 — End: 2023-10-17

## 2023-09-21 MED ORDER — CELECOXIB 50 MG PO CAPS: 50.0000 mg | ORAL_CAPSULE | Freq: Two times a day (BID) | ORAL | 3 refills | Status: AC | PRN

## 2023-09-21 NOTE — Progress Notes (Signed)
 ASSESSMENT AND PLAN  Ruth Gregory is a 63 y.o. female   History of right internal carotid artery dissection from severe vomiting related to migraine  headache, right MCA stroke in June 2009 Residual spastic left hemiparesis, receiving Botox  injection every 3 months  Probable complex partial seizure  Doing much better with lamotrigine  100 mg twice daily, which also helped her mood   Chronic migraine headaches  Ubrelvy  as needed New onset neck pain, left arm paresthesia  EMG nerve conduction study September 21, 2023 showed mild right carpal tunnel syndromes, chronic mild left C7 radiculopathy, her current complain likely a component of muscular skeleton issues in combination with mild cervical radiculopathy  Discussed with patient's and her husband, decided to hold off MRI of cervical spine  Will try neck stretch, heating pad, massage, as needed NSAIDs, Celebrex as needed,  Add on Cymbalta 30 mg daily  DIAGNOSTIC DATA (LABS, IMAGING, TESTING) - I reviewed patient records, labs, notes, testing and imaging myself where available. HISTORICAL  Ruth Gregory is a 63 year old right-handed female  History of right internal carotid section with spastic left hemiparesis, Botox  injection every 3 months since Nov 2010 She has history of right internal carotid artery dissection following an episode of vomiting from migraine headache in mid June 2009. She was given IV TPA and found to have right MCA occlusion. She underwent emergent right carotid artery stent with distal endovascular recanalization of the middle cerebral artery.  She had small hemorrhagic transformation of the right basal ganglia with mild left hemiparesis, but has done well since then and has been independent.  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  She has made marked recovery, ambulating only with very mild difficulty,  maintain majority of her left arm function,  there is mild limitation in the range of  motion in her left shoulder,  mild left shoulder stiffness and pain, most bothersome symptoms is her left elbow discomfort, left wrist achy pain, difficulty releasing left hand flexion, tendency for left arm persistent flexion,  left ankle plantar inversion, increased gait difficulty after prolonged walking, This has all been helped by Botox  injection.  She has been receiving BOTOX  injection since 01/2009 every 3 months, to her left upper extremity and also left lower extremity, responded well.   Repeat US  carotid were normal in October 2013  Chronic migraine headaches: She had long-standing history of chronic migraine, average 2-4 headaches each months, she has increased frequency headaches around fall, and spring,    She is not candidate for triptan because previous history of stroke, her migraine responding well to Ubrelvy  as needed  Sometimes she received Botox  injection as migraine prevention,    Since May 2022, she reported recurrent 2 episode of sudden onset transient confusion, most recent episode was on January 18, 2021, while driving, she began to turn towards the left side, had transient confusion, visual distortion, likely there was no incident, suspicious for partial seizure   EEG in November 2022 showed intermittent protrusion of slower wave dysrhythmic involving right frontotemporal region, no epileptiform discharge, but suggest focal irritability, consistent with her history of right MCA stroke  She also complains of mild memory loss, difficulty focusing, difficulty multitasking,  She was started on lamotrigine  titrating dose 100 mg twice a day since beginning of 2023, tolerating it well, also stabilize her mood,  Today she is accompanied by her husband for evaluation of new onset left arm deep achy pain, for 1 month, concurrent with worsening  neck pain, radiating tension to bilateral shoulder, she also complains patch of numbness at the dorsum of left hand,   EMG nerve  conduction study today September 21, 2023 showed mild right carpal tunnel syndromes, chronic left C7 radiculopathy, no active process    PHYSICAL EXAM      09/21/2023    8:29 AM 09/14/2023    8:56 AM 08/17/2023    3:58 PM  Vitals with BMI  Height 5' 9 5' 7.5 5' 9  Weight 155 lbs 155 lbs 6 oz 158 lbs  BMI 22.88 23.97 23.32  Systolic 128 118 869  Diastolic 80 80 80  Pulse  90       Gen: NAD, conversant, well nourised, well groomed      NEUROLOGICAL EXAM:  MENTAL STATUS: Speech/Cognition: Awake, alert, normal speech, oriented to history taking and casual conversation.  CRANIAL NERVES: CN II: Visual fields are full to confrontation.  Pupils are round equal and briskly reactive to light. CN III, IV, VI: extraocular movement are normal. No ptosis. CN V: Facial sensation is intact to light touch. CN VII: Face is symmetric with normal eye closure and smile. CN VIII: Hearing is normal to casual conversation CN IX, X: Palate elevates symmetrically. Phonation is normal. CN XI: Head turning and shoulder shrug are intact  MOTOR: Mild spastic left hemiparesis, tendency for left finger flexion, left arm pronation and left ankle plantarflexion  Deep tendon reflex: Mild hyperreflexia on the left side  Sensory: Intact to light touch  COORDINATION: There is no trunk or limb ataxia.    GAIT/STANCE: Mildly unsteady gait, tendency for left ankle plantarflexion  Review of system: Pertinent as above   ALLERGIES: Allergies  Allergen Reactions   Penicillins Anaphylaxis and Hives   Imitrex [Sumatriptan]    Other Swelling    kiwi   Sulfa Antibiotics Hives   Zomig [Zolmitriptan]     HOME MEDICATIONS: Current Outpatient Medications  Medication Sig Dispense Refill   albuterol  (PROVENTIL  HFA) 108 (90 Base) MCG/ACT inhaler 1 to 2 inhalations 10-15 minutes apart every 4 hours if needed for asthma rescue 48 g 3   ALPRAZolam  (XANAX ) 0.5 MG tablet Take 1/2-1 tablet at Bedtime ONLY if needed  for Sleep 30 tablet 0   amphetamine -dextroamphetamine  (ADDERALL) 10 MG tablet Take 1/2 to 1 tablet 1 or 2 x daily for ADD 60 tablet 0   aspirin  EC 81 MG tablet Take 81 mg by mouth daily.     augmented betamethasone dipropionate (DIPROLENE-AF) 0.05 % cream Apply topically 2 (two) times daily as needed.     Botulinum Toxin Type A  (BOTOX ) 200 UNITS SOLR Inject 400 Units as directed every 3 (three) months. Change to BOTOX  A 100  units     BREO ELLIPTA  100-25 MCG/INH AEPB 1 INHALATION DAILY TO PREVENT ASTHMA 180 each 3   buPROPion  (WELLBUTRIN  XL) 150 MG 24 hr tablet TAKE 1 TABLET EVERY MORNING FOR MOOD. FOCUS & CONCENTRATION 30 tablet 2   butalbital -acetaminophen -caffeine  (FIORICET) 50-325-40 MG tablet Take 1 tablet by mouth daily as needed for headache. Do not refill in less than 30 days, 12 tablet 5   cetirizine (ZYRTEC) 10 MG tablet Take 10 mg by mouth daily.     dexamethasone  (DECADRON ) 4 MG tablet Take 1 tab 3 x day - 3 days, then 2 x day - 3 days, then 1 tab daily 20 tablet 0   Diclofenac  Potassium,Migraine, (CAMBIA ) 50 MG PACK MIX 1 PACK WITH 1-2 OUNCES OF WATER AND DRINK AT ONSET  OF MIGRAINE CAN REPEAT IN 2 HOURS IF NEEDED MAX 2/24 HOURS 8 each 11   diclofenac  Sodium (VOLTAREN ) 1 % GEL Apply 4 g topically 4 (four) times daily as needed. 500 g 6   Estradiol  (YUVAFEM ) 10 MCG TABS vaginal tablet Place 10 mcg vaginally 2 (two) times a week.     fenofibrate  micronized (LOFIBRA) 134 MG capsule TAKE 1 CAPSULE EVERY DAY 90 capsule 3   levothyroxine  (SYNTHROID ) 88 MCG tablet Take 1 tablet daily on an empty stomach with only water for 30 minutes & no Antacid meds, Calcium  or Magnesium for 4 hours & avoid Biotin 90 tablet 0   magnesium oxide (MAG-OX) 400 MG tablet Take 400 mg by mouth 2 (two) times daily.      montelukast  (SINGULAIR ) 10 MG tablet Take 1 tablet Daily  for Allergies. 90 tablet 3   Multiple Vitamin (MULTIVITAMIN WITH MINERALS) TABS tablet Take 1 tablet by mouth daily.     Multiple  Vitamins-Minerals (ZINC PO) Take 1 tablet by mouth daily.     nortriptyline  (PAMELOR ) 25 MG capsule Take 25 mg by mouth at bedtime.     omeprazole  (PRILOSEC) 40 MG capsule TAKE 1 CAPSULE DAILY FOR ACID INDIGESTION & REFLUX 90 capsule 3   ondansetron  (ZOFRAN  ODT) 8 MG disintegrating tablet Dissolve 1 tablet under tongue every 6 to 8 hours for nausea  or vomitting 30 tablet 0   Riboflavin 100 MG TABS Take 100 mg by mouth. bid     rosuvastatin  (CRESTOR ) 40 MG tablet Take 1 tablet (40 mg total) by mouth daily. Take 1 tablet Daily for Cholesterol 90 tablet 3   topiramate  (TOPAMAX ) 100 MG tablet Take 2 tablets (200 mg total) by mouth daily. 180 tablet 3   verapamil  (CALAN -SR) 240 MG CR tablet TAKE 1 TABLET DAILY WITH FOOD FOR BP & MIGRAINE PREVENTION 90 tablet 3   vitamin B-12 (CYANOCOBALAMIN ) 1000 MCG tablet Take 1,000 mcg by mouth daily.     Vitamin D , Ergocalciferol , (DRISDOL ) 1.25 MG (50000 UT) CAPS capsule Take 1 capsule daily for Vitamin D  deficiency. 90 capsule 1   Erenumab -aooe (AIMOVIG ) 70 MG/ML SOAJ Inject 70 mg into the skin every 30 (thirty) days. 1 mL 11   Ubrogepant  (UBRELVY ) 50 MG TABS Take 50 mg by mouth as needed. May repeat once in 2 hours 12 tablet 11   No current facility-administered medications for this visit.    PAST MEDICAL HISTORY: Past Medical History:  Diagnosis Date   Anemia    Anxiety    Asthma    Chronic heartburn    Depression    Elevated cholesterol    on Crestor    Headache(784.0)    Hypertension    Hypothyroidism    S/P thyroidectomy   Migraine    Occlusion and stenosis of carotid artery with cerebral infarction    Spasm of muscle    Unspecified cerebral artery occlusion with cerebral infarction    Vitamin D  deficiency     PAST SURGICAL HISTORY: Past Surgical History:  Procedure Laterality Date   AUGMENTATION MAMMAPLASTY Bilateral 2000   COSMETIC SURGERY     CYSTOCELE REPAIR N/A 01/08/2013   Procedure: ANTERIOR REPAIR (CYSTOCELE);  Surgeon:  Charlie CHRISTELLA Croak, MD;  Location: [facility];  Service: Gynecology;  Laterality: N/A;   NASAL SINUS SURGERY     VAGINAL HYSTERECTOMY      FAMILY HISTORY: Family History  Problem Relation Age of Onset   Heart disease Mother    Peptic Ulcer Disease Mother  Heart disease Father    Parkinson's disease Father     SOCIAL HISTORY:  Social History   Socioeconomic History   Marital status: Married    Spouse name: Marcey   Number of children: 2   Years of education: college   Highest education level: Not on file  Occupational History   Occupation: DESIGNER    Employer: SELF  Tobacco Use   Smoking status: Never Smoker   Smokeless tobacco: Never Used  Substance and Sexual Activity   Alcohol use: Yes    Comment: occassionally drinks a glass of wine   Drug use: No   Sexual activity: Not on file  Other Topics Concern   Not on file  Social History Narrative   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 Patient is married and lives with her husband. Patient has college education.   Right handed.   Caffeine - two cups daily.   Social Determinants of Health   Financial Resource Strain:    Difficulty of Paying Living Expenses: Not on file  Food Insecurity:    Worried About Programme researcher, broadcasting/film/video in the Last Year: Not on file   [employer] of Food in the Last Year: Not on file  Transportation Needs:    Lack of Transportation (Medical): Not on file   Lack of Transportation (Non-Medical): Not on file  Physical Activity:    Days of Exercise per Week: Not on file   Minutes of Exercise per Session: Not on file  Stress:    Feeling of Stress : Not on file  Social Connections:    Frequency of Communication with Friends and Family: Not on file   Frequency of Social Gatherings with Friends and Family: Not on file   Attends Religious Services: Not on file   Active Member of Clubs or Organizations: Not on file   Attends Banker Meetings: Not on file   Marital Status: Not  on file  Intimate Partner Violence:    Fear of Current or Ex-Partner: Not on file   Emotionally Abused: Not on file   Physically Abused: Not on file   Sexually Abused: Not on file      Modena Callander, M.D. Ph.D.  [facility] [address], [address] [place], [place] [address] Ph: [phone] Fax: [phone]

## 2023-09-21 NOTE — Procedures (Signed)
 Full Name: Ruth Gregory Gender: Female MRN #: [id number] Date of Birth: [birth year]    Visit Date: 09/21/2023 08:48 Age: 63 Years Examining Physician: Onita Duos Referring Physician: Onita Duos Height: 5 feet 9 inch History: 63 year old female with history of right MCA stroke, with residual mild spastic left hemiparesis, presenting with new onset worsening neck pain, left arm paresthesia, deep achy pain  Summary of the test: Nerve conduction study: Left median, ulnar sensory and motor responses were normal.  Left radial sensory response was normal.  Right ulnar sensory and motor responses were normal.  Right median sensory response showed mildly prolonged peak latency.  Right median motor response was normal.  Electromyography: Selected needle examination of left upper extremity muscles, bilateral cervical paraspinal muscles were performed.  There were mild polyphasic motor unit potentials with mildly decreased recruitment pattern at the left C7.  Bilateral cervical paraspinal muscles also showed polyphasic morphology, there was no spontaneous activity.  Conclusion: This is a mild normal study.  There is electrodiagnostic evidence of mild right distal median neuropathy across the wrist, consistent with mild right carpal tunnel syndromes.  In addition, there is evidence of mild left C7 radiculopathy, no active process.    ------------------------------- Duos Onita. M.D. Ph.D.   [facility] [address] [place], [place] [address] Tel: [phone] Fax: [phone]  Verbal informed consent was obtained from the patient, patient was informed of potential risk of procedure, including bruising, bleeding, hematoma formation, infection, muscle weakness, muscle pain, numbness, among others.        MNC    Nerve / Sites Muscle Latency Ref. Amplitude Ref. Rel Amp Segments Distance Velocity Ref. Area    ms ms mV mV %  cm m/s m/s mVms  R Median - APB      Wrist APB 4.1 <=4.4 7.6 >=4.0 100 Wrist - APB 7   25.6     Upper arm APB 8.6  7.4  96.6 Upper arm - Wrist 22 49 >=49 26.8  L Median - APB     Wrist APB 4.3 <=4.4 8.0 >=4.0 100 Wrist - APB 7   31.5     Upper arm APB 8.4  7.9  99.1 Upper arm - Wrist 22 54 >=49 31.0  R Ulnar - ADM     Wrist ADM 3.0 <=3.3 11.5 >=6.0 100 Wrist - ADM 7   36.0     B.Elbow ADM 5.4  12.0  104 B.Elbow - Wrist 15 62 >=49 35.8     A.Elbow ADM 7.9  11.7  97.2 A.Elbow - B.Elbow 15 60 >=49 36.2  L Ulnar - ADM     Wrist ADM 3.2 <=3.3 8.6 >=6.0 100 Wrist - ADM 7   38.5     B.Elbow ADM 5.6  7.8  90.5 B.Elbow - Wrist 12 51 >=49 34.2     A.Elbow ADM 8.0  8.0  103 A.Elbow - B.Elbow 14 58 >=49 37.2             SNC    Nerve / Sites Rec. Site Peak Lat Ref.  Amp Ref. Segments Distance    ms ms V V  cm  L Radial - Anatomical snuff box (Forearm)     Forearm Wrist 2.9 <=2.9 44 >=15 Forearm - Wrist 10  L Median, Ulnar - Transcarpal comparison     Median Palm Wrist 2.3 <=2.2 69 >=35 Median Palm - Wrist 8     Ulnar Palm Wrist 2.1 <=  2.2 21 >=12 Ulnar Palm - Wrist 8        Median 3 - Ulnar Palm   R Median - Orthodromic (Dig II, Mid palm)     Dig II Wrist 3.8 <=3.4 14 >=10 Dig II - Wrist 13  L Median - Orthodromic (Dig II, Mid palm)     Dig II Wrist 3.4 <=3.4 35 >=10 Dig II - Wrist 13  R Ulnar - Orthodromic, (Dig V, Mid palm)     Dig V Wrist 2.9 <=3.1 12 >=5 Dig V - Wrist 11  L Ulnar - Orthodromic, (Dig V, Mid palm)     Dig V Wrist 3.0 <=3.1 9 >=5 Dig V - Wrist 51                 F  Wave    Nerve F Lat Ref.   ms ms  R Ulnar - ADM 28.1 <=32.0  L Ulnar - ADM 27.2 <=32.0         EMG Summary Table    Spontaneous MUAP Recruitment  Muscle IA Fib PSW Fasc Other Amp Dur. Poly Pattern  L. First dorsal interosseous Normal None None None _______ Normal Normal Normal Normal  L. Pronator teres Normal None None None _______ Normal Normal Normal Normal  L. Biceps brachii Normal None None None _______ Normal Normal Normal Normal   L. Deltoid Normal None None None _______ Normal Normal Normal Normal  L. Triceps brachii Normal None None None _______ Increased Increased 1+ Reduced  L. Extensor digitorum communis Normal None None None _______ Normal Normal Normal Normal  R. Cervical paraspinals Normal None None None _______ Increased Increased 1+ Normal  L. Cervical paraspinals Normal None None None _______ Increased Increased Normal Normal

## 2023-09-26 ENCOUNTER — Ambulatory Visit: Payer: Self-pay | Admitting: Family Medicine

## 2023-09-29 ENCOUNTER — Other Ambulatory Visit: Payer: Self-pay | Admitting: Neurology

## 2023-10-09 ENCOUNTER — Encounter: Payer: Self-pay | Admitting: Family Medicine

## 2023-10-09 DIAGNOSIS — E559 Vitamin D deficiency, unspecified: Secondary | ICD-10-CM

## 2023-10-10 MED ORDER — VITAMIN D (ERGOCALCIFEROL) 1.25 MG (50000 UNIT) PO CAPS: 50000.0000 [IU] | ORAL_CAPSULE | ORAL | 1 refills | Status: DC

## 2023-10-13 ENCOUNTER — Other Ambulatory Visit: Payer: Self-pay | Admitting: Family Medicine

## 2023-10-13 DIAGNOSIS — J4520 Mild intermittent asthma, uncomplicated: Secondary | ICD-10-CM

## 2023-10-17 ENCOUNTER — Other Ambulatory Visit: Payer: Self-pay | Admitting: Neurology

## 2023-10-18 ENCOUNTER — Other Ambulatory Visit (INDEPENDENT_AMBULATORY_CARE_PROVIDER_SITE_OTHER)

## 2023-10-18 ENCOUNTER — Ambulatory Visit: Admitting: Orthopaedic Surgery

## 2023-10-18 ENCOUNTER — Encounter: Payer: Self-pay | Admitting: Orthopaedic Surgery

## 2023-10-18 DIAGNOSIS — G8929 Other chronic pain: Secondary | ICD-10-CM

## 2023-10-18 DIAGNOSIS — M545 Low back pain, unspecified: Secondary | ICD-10-CM

## 2023-10-18 MED ORDER — METHYLPREDNISOLONE 4 MG PO TBPK
ORAL_TABLET | ORAL | 0 refills | Status: DC
Start: 2023-10-18 — End: 2024-01-24

## 2023-10-18 NOTE — Progress Notes (Signed)
 Office Visit Note   Patient: Ruth Gregory           Date of Birth: 1960/09/20           MRN: [id number] Visit Date: 10/18/2023              Requested by: Ruth Heron HERO, MD [id number] [place],  [place] [address] PCP: Ruth Heron HERO, MD   Assessment & Plan: Visit Diagnoses:  1. Chronic midline low back pain, unspecified whether sciatica present     Plan: History of Present Illness Ruth Gregory is a 63 year old female with degenerative arthritis and scoliosis who presents with back pain and numbness in her left leg.  Back pain began during a hospitalization following a stroke, where she was bedridden for five weeks. An MRI and injections provided relief at that time. Currently, resting and ibuprofen  help manage inflammation and pain. She has not used prednisone  recently.  Numbness is present in her left leg without associated weakness. A nerve conduction test showed no nerve damage. An MRI was supposed to be ordered, but there have been no updates, possibly due to insurance issues.  Her back muscles are weak. She performs balance exercises and wall push-ups, as neck pain prevents floor exercises.  Results RADIOLOGY Lumbar spine X-ray: Degenerative arthritis, degenerative scoliosis, spondylolisthesis L4-5  Assessment and Plan Degenerative arthritis of lumbar spine and radiculopathy Chronic degenerative arthritis with scoliosis causing back pain and left leg numbness. Previous ibuprofen  and physical therapy effective. Methylprednisolone  recommended for fewer side effects. - Prescribe methylprednisolone  dose pack. - Refer to physical therapy at [facility] [place] location.  Follow-Up Instructions: No follow-ups on file.   Orders:  Orders Placed This Encounter  Procedures   XR Lumbar Spine 2-3 Views   Ambulatory referral to Physical Therapy   Meds ordered this encounter  Medications   methylPREDNISolone  (MEDROL  DOSEPAK) 4 MG TBPK tablet     Sig: Take as directed    Dispense:  21 tablet    Refill:  0      Procedures: No procedures performed   Clinical Data: No additional findings.   Subjective: Chief Complaint  Patient presents with   Lower Back - Pain    HPI  Review of Systems  Constitutional: Negative.   HENT: Negative.    Eyes: Negative.   Respiratory: Negative.    Cardiovascular: Negative.   Endocrine: Negative.   Musculoskeletal: Negative.   Neurological: Negative.   Hematological: Negative.   Psychiatric/Behavioral: Negative.    All other systems reviewed and are negative.    Objective: Vital Signs: There were no vitals taken for this visit.  Physical Exam Vitals and nursing note reviewed.  Constitutional:      Appearance: She is well-developed.  HENT:     Head: Normocephalic and atraumatic.     Nose: Nose normal.  Eyes:     Extraocular Movements: Extraocular movements intact.  Cardiovascular:     Pulses: Normal pulses.  Pulmonary:     Effort: Pulmonary effort is normal.  Abdominal:     Palpations: Abdomen is soft.  Musculoskeletal:     Cervical back: Neck supple.  Skin:    General: Skin is warm.     Capillary Refill: Capillary refill takes less than 2 seconds.  Neurological:     Mental Status: She is alert and oriented to person, place, and time. Mental status is at baseline.  Psychiatric:        Behavior: Behavior normal.  Thought Content: Thought content normal.        Judgment: Judgment normal.     Ortho Exam  Specialty Comments:  No specialty comments available.  Imaging: XR Lumbar Spine 2-3 Views Result Date: 10/18/2023 X-rays of the lumbar spine show degenerative scoliosis and degenerative spondylolisthesis at L4-5 without any acute abnormalities.    PMFS History: Patient Active Problem List   Diagnosis Date Noted   Left arm pain 09/21/2023   Cerebrovascular accident (CVA) (HCC) 02/02/2022   Muscle weakness (generalized) 02/02/2022   Gait abnormality  01/13/2022   Closed displaced fracture of proximal phalanx of lesser toe, initial encounter 10/21/2021   Squamous cell carcinoma in situ (SCCIS) of dorsum of left hand 09/11/2021   Arthritis of carpometacarpal [facility]) joint of right thumb 08/11/2021   Degenerative arthritis of distal interphalangeal joint of index finger of right hand 08/11/2021   Degenerative arthritis of distal interphalangeal joint of middle finger of right hand 08/11/2021   Degenerative arthritis of distal interphalangeal joint of ring finger of right hand 08/11/2021   Partial symptomatic epilepsy with complex partial seizures, not intractable, without status epilepticus (HCC) 01/21/2021   B12 deficiency 09/18/2020   Chronic migraine w/o aura w/o status migrainosus, not intractable 04/15/2020   Spastic hemiplegia of left nondominant side as late effect of cerebrovascular disease (HCC) 06/18/2019   Abnormal glucose 07/27/2018   Labile hypertension 07/27/2018   Fatty liver 10/09/2017   GERD (gastroesophageal reflux disease) 10/07/2017   Anxiety 10/07/2017   Hypothyroidism 07/23/2015   Vitamin D  deficiency 10/03/2013   Medication management 10/03/2013   History of prediabetes 10/03/2013   ADD (attention deficit disorder) without hyperactivity 10/03/2013   Hyperlipidemia, mixed 04/04/2013   Sinusitis, chronic 04/04/2013   Migraine 09/06/2012   Spastic hemiplegia affecting left nondominant side (HCC) 09/06/2012   History of CVA (cerebrovascular accident)    Past Medical History:  Diagnosis Date   Anemia    Anxiety    Asthma    Chronic heartburn    Depression    Elevated cholesterol    on Crestor    Headache(784.0)    Hypertension    Hypothyroidism    S/P thyroidectomy   Migraine    Occlusion and stenosis of carotid artery with cerebral infarction    Spasm of muscle    Unspecified cerebral artery occlusion with cerebral infarction    Vitamin D  deficiency     Family History  Problem Relation Age of Onset    Heart disease Mother    Peptic Ulcer Disease Mother    Heart disease Father    Parkinson's disease Father     Past Surgical History:  Procedure Laterality Date   AUGMENTATION MAMMAPLASTY Bilateral 2000   COSMETIC SURGERY     CYSTOCELE REPAIR N/A 01/08/2013   Procedure: ANTERIOR REPAIR (CYSTOCELE);  Surgeon: Charlie CHRISTELLA Croak, MD;  Location: [facility];  Service: Gynecology;  Laterality: N/A;   NASAL SINUS SURGERY     VAGINAL HYSTERECTOMY     Social History   Occupational History   Occupation: DESIGNER    Employer: [employer]  Tobacco Use   Smoking status: Never   Smokeless tobacco: Never  Substance and Sexual Activity   Alcohol use: Yes    Comment: occassionally drinks a glass of wine   Drug use: No   Sexual activity: Not on file

## 2023-10-27 ENCOUNTER — Other Ambulatory Visit: Payer: Self-pay | Admitting: Family

## 2023-10-27 ENCOUNTER — Other Ambulatory Visit: Payer: Self-pay | Admitting: Family Medicine

## 2023-10-27 ENCOUNTER — Telehealth: Payer: Self-pay | Admitting: *Deleted

## 2023-10-27 ENCOUNTER — Telehealth: Payer: Self-pay | Admitting: Family Medicine

## 2023-10-27 DIAGNOSIS — F419 Anxiety disorder, unspecified: Secondary | ICD-10-CM

## 2023-10-27 DIAGNOSIS — F988 Other specified behavioral and emotional disorders with onset usually occurring in childhood and adolescence: Secondary | ICD-10-CM

## 2023-10-27 MED ORDER — AMPHETAMINE-DEXTROAMPHETAMINE 10 MG PO TABS: ORAL_TABLET | ORAL | 0 refills | Status: DC

## 2023-10-27 NOTE — Telephone Encounter (Signed)
 Script sent

## 2023-10-27 NOTE — Telephone Encounter (Signed)
 Copied from CRM [id number]. Topic: Clinical - Medication Refill >> Oct 27, 2023 12:35 PM Chasity T wrote: Medication: amphetamine -dextroamphetamine  (ADDERALL) 10 MG tablet  Has the patient contacted their pharmacy? Yes  This is the patient's preferred pharmacy:  [id number] - [place], [place] - [address] [place] [address] Phone: [phone] Fax: [phone]  Is this the correct pharmacy for this prescription? Yes If no, delete pharmacy and type the correct one.   Has the prescription been filled recently? No  Is the patient out of the medication? Yes  Has the patient been seen for an appointment in the last year OR does the patient have an upcoming appointment? Yes  Can we respond through MyChart? Yes  Agent: Please be advised that Rx refills may take up to 3 business days. We ask that you follow-up with your pharmacy.

## 2023-10-27 NOTE — Telephone Encounter (Unsigned)
 Copied from CRM [id number]. Topic: Clinical - Medication Refill >> Oct 27, 2023 12:35 PM Chasity T wrote: Medication: amphetamine -dextroamphetamine  (ADDERALL) 10 MG tablet  Has the patient contacted their pharmacy? Yes  This is the patient's preferred pharmacy:  [id number] - [place], [place] - [address] [place] [address] Phone: [phone] Fax: [phone]  Is this the correct pharmacy for this prescription? Yes If no, delete pharmacy and type the correct one.   Has the prescription been filled recently? No  Is the patient out of the medication? Yes  Has the patient been seen for an appointment in the last year OR does the patient have an upcoming appointment? Yes  Can we respond through MyChart? Yes  Agent: Please be advised that Rx refills may take up to 3 business days. We ask that you follow-up with your pharmacy.

## 2023-11-01 ENCOUNTER — Ambulatory Visit: Admitting: Licensed Clinical Social Worker

## 2023-11-09 ENCOUNTER — Ambulatory Visit: Admitting: Neurology

## 2023-11-11 ENCOUNTER — Encounter: Admitting: Neurology

## 2023-11-16 ENCOUNTER — Ambulatory Visit: Admitting: Neurology

## 2023-11-16 VITALS — BP 125/78 | HR 91

## 2023-11-16 DIAGNOSIS — I69954 Hemiplegia and hemiparesis following unspecified cerebrovascular disease affecting left non-dominant side: Secondary | ICD-10-CM | POA: Diagnosis not present

## 2023-11-16 MED ORDER — ONABOTULINUMTOXINA 100 UNITS IJ SOLR
300.0000 [IU] | Freq: Once | INTRAMUSCULAR | Status: AC
  Administered 2023-11-16: 300 [IU] via INTRAMUSCULAR

## 2023-11-16 NOTE — Progress Notes (Signed)
 Botox - 100 units x 3 vials Lot: [id number] Expiration: 2027/11 NDC: 9976-8854-98  Bacteriostatic 0.9% Sodium Chloride - 6 mL  Lot: [id number] Expiration: 01/26/25 NDC: [id number]  Dx: P30.045  B/B Witnessed by DELENA MOLT RN

## 2023-11-16 NOTE — Progress Notes (Signed)
 ASSESSMENT AND PLAN  Ruth Gregory is a 63 y.o. female   History of right internal carotid artery dissection from severe vomiting related to migraine  headache, right MCA stroke in June 2009 Residual spastic left hemiparesis, receiving Botox  injection every 3 months  Electrical simulation guided Botox  injection for spastic left hemiparesis, uses 300 units Left pectoralis major 50 units Left latissimus dorsi 75 units Left pronator teres 25 units Left brachialis 50 units  Left tibialis posterior 50 units Left flexor digitorum longus 50 units   DIAGNOSTIC DATA (LABS, IMAGING, TESTING) - I reviewed patient records, labs, notes, testing and imaging myself where available. HISTORICAL  Ruth Gregory is a 63 year old right-handed female  History of right internal carotid section with spastic left hemiparesis, Botox  injection every 3 months since Nov 2010 She has history of right internal carotid artery dissection following an episode of vomiting from migraine headache in mid June 2009. She was given IV TPA and found to have right MCA occlusion. She underwent emergent right carotid artery stent with distal endovascular recanalization of the middle cerebral artery.  She had small hemorrhagic transformation of the right basal ganglia with mild left hemiparesis, but has done well since then and has been independent.  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  She has made marked recovery, ambulating only with very mild difficulty,  maintain majority of her left arm function,  there is mild limitation in the range of motion in her left shoulder,  mild left shoulder stiffness and pain, most bothersome symptoms is her left elbow discomfort, left wrist achy pain, difficulty releasing left hand flexion, tendency for left arm persistent flexion,  left ankle plantar inversion, increased gait difficulty after prolonged walking, This has all been helped by Botox  injection.  She  has been receiving BOTOX  injection since 01/2009 every 3 months, to her left upper extremity and also left lower extremity, responded well.   Repeat US  carotid were normal in October 2013  Chronic migraine headaches: She had long-standing history of chronic migraine, average 2-4 headaches each months, she has increased frequency headaches around fall, and spring,    She is not candidate for triptan because previous history of stroke, her migraine responding well to Ubrelvy  as needed  Sometimes she received Botox  injection as migraine prevention,    Since May 2022, she reported recurrent 2 episode of sudden onset transient confusion, most recent episode was on January 18, 2021, while driving, she began to turn towards the left side, had transient confusion, visual distortion, likely there was no incident, suspicious for partial seizure   EEG in November 2022 showed intermittent protrusion of slower wave dysrhythmic involving right frontotemporal region, no epileptiform discharge, but suggest focal irritability, consistent with her history of right MCA stroke  She also complains of mild memory loss, difficulty focusing, difficulty multitasking,  She was started on lamotrigine  titrating dose 100 mg twice a day since beginning of 2023, tolerating it well, also stabilize her mood,  Today she is accompanied by her husband for evaluation of new onset left arm deep achy pain, for 1 month, concurrent with worsening neck pain, radiating tension to bilateral shoulder, she also complains patch of numbness at the dorsum of left hand,   EMG nerve conduction study today September 21, 2023 showed mild right carpal tunnel syndromes, chronic left C7 radiculopathy, no active process  UPDATE November 16 2023: She responded well to previous injection, but has left fifth finger weakness   PHYSICAL  EXAM      11/16/2023    4:13 PM 09/21/2023    8:29 AM 09/14/2023    8:56 AM  Vitals with BMI  Height  5' 9 5' 7.5   Weight  155 lbs 155 lbs 6 oz  BMI  22.88 23.97  Systolic 125 128 881  Diastolic 78 80 80  Pulse 91  90      Gen: NAD, conversant, well nourised, well groomed      NEUROLOGICAL EXAM:  MENTAL STATUS: Speech/Cognition: Awake, alert, normal speech, oriented to history taking and casual conversation.  CRANIAL NERVES: CN II: Visual fields are full to confrontation.  Pupils are round equal and briskly reactive to light. CN III, IV, VI: extraocular movement are normal. No ptosis. CN V: Facial sensation is intact to light touch. CN VII: Face is symmetric with normal eye closure and smile. CN VIII: Hearing is normal to casual conversation CN IX, X: Palate elevates symmetrically. Phonation is normal. CN XI: Head turning and shoulder shrug are intact  MOTOR: Mild spastic left hemiparesis, tendency for left finger flexion, left arm pronation and left ankle plantarflexion  Deep tendon reflex: Mild hyperreflexia on the left side  Sensory: Intact to light touch  COORDINATION: There is no trunk or limb ataxia.    GAIT/STANCE: Mildly unsteady gait, tendency for left ankle plantarflexion  Review of system: Pertinent as above   ALLERGIES: Allergies  Allergen Reactions   Penicillins Anaphylaxis and Hives   Imitrex [Sumatriptan]    Other Swelling    kiwi   Sulfa Antibiotics Hives   Zomig [Zolmitriptan]     HOME MEDICATIONS: Current Outpatient Medications  Medication Sig Dispense Refill   albuterol  (PROVENTIL  HFA) 108 (90 Base) MCG/ACT inhaler 1 to 2 inhalations 10-15 minutes apart every 4 hours if needed for asthma rescue 48 g 3   ALPRAZolam  (XANAX ) 0.5 MG tablet Take 1/2-1 tablet at Bedtime ONLY if needed for Sleep 30 tablet 0   amphetamine -dextroamphetamine  (ADDERALL) 10 MG tablet Take 1/2 to 1 tablet 1 or 2 x daily for ADD 60 tablet 0   aspirin  EC 81 MG tablet Take 81 mg by mouth daily.     augmented betamethasone dipropionate (DIPROLENE-AF) 0.05 % cream Apply topically 2 (two)  times daily as needed.     Botulinum Toxin Type A  (BOTOX ) 200 UNITS SOLR Inject 400 Units as directed every 3 (three) months. Change to BOTOX  A 100  units     BREO ELLIPTA  100-25 MCG/INH AEPB 1 INHALATION DAILY TO PREVENT ASTHMA 180 each 3   buPROPion  (WELLBUTRIN  XL) 150 MG 24 hr tablet TAKE 1 TABLET EVERY MORNING FOR MOOD. FOCUS & CONCENTRATION 30 tablet 2   butalbital -acetaminophen -caffeine  (FIORICET) 50-325-40 MG tablet Take 1 tablet by mouth daily as needed for headache. Do not refill in less than 30 days, 12 tablet 5   cetirizine (ZYRTEC) 10 MG tablet Take 10 mg by mouth daily.     dexamethasone  (DECADRON ) 4 MG tablet Take 1 tab 3 x day - 3 days, then 2 x day - 3 days, then 1 tab daily 20 tablet 0   Diclofenac  Potassium,Migraine, (CAMBIA ) 50 MG PACK MIX 1 PACK WITH 1-2 OUNCES OF WATER AND DRINK AT ONSET OF MIGRAINE CAN REPEAT IN 2 HOURS IF NEEDED MAX 2/24 HOURS 8 each 11   diclofenac  Sodium (VOLTAREN ) 1 % GEL Apply 4 g topically 4 (four) times daily as needed. 500 g 6   Estradiol  (YUVAFEM ) 10 MCG TABS vaginal tablet Place 10 mcg  vaginally 2 (two) times a week.     fenofibrate  micronized (LOFIBRA) 134 MG capsule TAKE 1 CAPSULE EVERY DAY 90 capsule 3   levothyroxine  (SYNTHROID ) 88 MCG tablet Take 1 tablet daily on an empty stomach with only water for 30 minutes & no Antacid meds, Calcium  or Magnesium for 4 hours & avoid Biotin 90 tablet 0   magnesium oxide (MAG-OX) 400 MG tablet Take 400 mg by mouth 2 (two) times daily.      montelukast  (SINGULAIR ) 10 MG tablet Take 1 tablet Daily  for Allergies. 90 tablet 3   Multiple Vitamin (MULTIVITAMIN WITH MINERALS) TABS tablet Take 1 tablet by mouth daily.     Multiple Vitamins-Minerals (ZINC PO) Take 1 tablet by mouth daily.     nortriptyline  (PAMELOR ) 25 MG capsule Take 25 mg by mouth at bedtime.     omeprazole  (PRILOSEC) 40 MG capsule TAKE 1 CAPSULE DAILY FOR ACID INDIGESTION & REFLUX 90 capsule 3   ondansetron  (ZOFRAN  ODT) 8 MG disintegrating tablet  Dissolve 1 tablet under tongue every 6 to 8 hours for nausea  or vomitting 30 tablet 0   Riboflavin 100 MG TABS Take 100 mg by mouth. bid     rosuvastatin  (CRESTOR ) 40 MG tablet Take 1 tablet (40 mg total) by mouth daily. Take 1 tablet Daily for Cholesterol 90 tablet 3   topiramate  (TOPAMAX ) 100 MG tablet Take 2 tablets (200 mg total) by mouth daily. 180 tablet 3   verapamil  (CALAN -SR) 240 MG CR tablet TAKE 1 TABLET DAILY WITH FOOD FOR BP & MIGRAINE PREVENTION 90 tablet 3   vitamin B-12 (CYANOCOBALAMIN ) 1000 MCG tablet Take 1,000 mcg by mouth daily.     Vitamin D , Ergocalciferol , (DRISDOL ) 1.25 MG (50000 UT) CAPS capsule Take 1 capsule daily for Vitamin D  deficiency. 90 capsule 1   Erenumab -aooe (AIMOVIG ) 70 MG/ML SOAJ Inject 70 mg into the skin every 30 (thirty) days. 1 mL 11   Ubrogepant  (UBRELVY ) 50 MG TABS Take 50 mg by mouth as needed. May repeat once in 2 hours 12 tablet 11   No current facility-administered medications for this visit.    PAST MEDICAL HISTORY: Past Medical History:  Diagnosis Date   Anemia    Anxiety    Asthma    Chronic heartburn    Depression    Elevated cholesterol    on Crestor    Headache(784.0)    Hypertension    Hypothyroidism    S/P thyroidectomy   Migraine    Occlusion and stenosis of carotid artery with cerebral infarction    Spasm of muscle    Unspecified cerebral artery occlusion with cerebral infarction    Vitamin D  deficiency     PAST SURGICAL HISTORY: Past Surgical History:  Procedure Laterality Date   AUGMENTATION MAMMAPLASTY Bilateral 2000   COSMETIC SURGERY     CYSTOCELE REPAIR N/A 01/08/2013   Procedure: ANTERIOR REPAIR (CYSTOCELE);  Surgeon: Charlie CHRISTELLA Croak, MD;  Location: [facility];  Service: Gynecology;  Laterality: N/A;   NASAL SINUS SURGERY     VAGINAL HYSTERECTOMY      FAMILY HISTORY: Family History  Problem Relation Age of Onset   Heart disease Mother    Peptic Ulcer Disease Mother    Heart disease Father    Parkinson's  disease Father     SOCIAL HISTORY:  Social History   Socioeconomic History   Marital status: Married    Spouse name: Marcey   Number of children: 2   Years of education: college  Highest education level: Not on file  Occupational History   Occupation: DESIGNER    Employer: [employer]EL[employer]  Tobacco Use   Smoking status: Never Smoker   Smokeless tobacco: Never Used  Substance and Sexual Activity   Alcohol use: Yes    Comment: occassionally drinks a glass of wine   Drug use: No   Sexual activity: Not on file  Other Topics Concern   Not on file  Social History Narrative   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 Patient is married and lives with her husband. Patient has college education.   Right handed.   Caffeine - two cups daily.   Social Determinants of Health   Financial Resource Strain:    Difficulty of Paying Living Expenses: Not on file  Food Insecurity:    Worried About Programme researcher, broadcasting/film/video in the Last Year: Not on file   [employer] of Food in the Last Year: Not on file  Transportation Needs:    Lack of Transportation (Medical): Not on file   Lack of Transportation (Non-Medical): Not on file  Physical Activity:    Days of Exercise per Week: Not on file   Minutes of Exercise per Session: Not on file  Stress:    Feeling of Stress : Not on file  Social Connections:    Frequency of Communication with Friends and Family: Not on file   Frequency of Social Gatherings with Friends and Family: Not on file   Attends Religious Services: Not on file   Active Member of Clubs or Organizations: Not on file   Attends Banker Meetings: Not on file   Marital Status: Not on file  Intimate Partner Violence:    Fear of Current or Ex-Partner: Not on file   Emotionally Abused: Not on file   Physically Abused: Not on file   Sexually Abused: Not on file      Modena Callander, M.D. Ph.D.  [facility] [address] [place], [place]  [address] Ph: [phone] Fax: [phone]

## 2023-11-18 ENCOUNTER — Ambulatory Visit: Payer: Medicare Other | Admitting: Nurse Practitioner

## 2023-11-21 ENCOUNTER — Ambulatory Visit (INDEPENDENT_AMBULATORY_CARE_PROVIDER_SITE_OTHER): Admitting: Licensed Clinical Social Worker

## 2023-11-21 DIAGNOSIS — F411 Generalized anxiety disorder: Secondary | ICD-10-CM | POA: Insufficient documentation

## 2023-11-21 NOTE — Progress Notes (Signed)
 [facility] Counselor/Therapist Progress Note  Patient ID: Ruth Gregory, MRN: [id number]    Date: 11/21/23  Time Spent: 0202  pm - 0300 pm : 58 Minutes  Treatment Type: Initial Assessment and Treatment Plan  Presenting Problem Chief Complaint: Patient reports that she had a stroke in 2009. She has a high pressure job and they were paying for her disability. 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 Patient is concerned about her emotional stability and her disability. Patient reports that she is secluded by being home so much. She often pushes herself to do things.   What are the main stressors in your life right now, how long? Anxiety   3, Sleep Changes   3, Irritability   3, and Low Energy   3   Previous mental health services Have you ever been treated for a mental health problem, when, where, by whom? Yes  2 years after her marriage in 56.   Are you currently seeing a therapist or counselor, counselor's name? No NA  Have you ever had a mental health hospitalization, how many times, length of stay? No NA  Have you ever been treated with medication, name, reason, response? Yes Xanax , Adderall, Wellbutrin , Cymbalta , Lamictal ,   Have you ever had suicidal thoughts or attempted suicide, when, how? No NA  Risk factors for Suicide Demographic factors:  Age 65 or older, Caucasian, and Unemployed Current mental status: No plan to harm self or others Loss factors: Decrease in vocational status Historical factors: NA Risk Reduction factors: Sense of responsibility to family, Religious beliefs about death, and Living with another person, especially a relative Clinical factors:  Severe Anxiety and/or Agitation Depression:   Moderate More than one psychiatric diagnosis Medical Diagnoses and Treatments/Surgeries Cognitive features that contribute to risk: NA    SUICIDE RISK:  Minimal: No identifiable suicidal ideation.  Patients  presenting with no risk factors but with morbid ruminations; may be classified as minimal risk based on the severity of the depressive symptoms  Medical history Medical treatment and/or problems, explain: Yes           zinc gluconate 50 MG tablet 50 mg, Daily          Vitamin D , Ergocalciferol , (DRISDOL ) 1.25 MG (50000 UNIT) CAPS capsule 50,000 Units, Every 7 days         vitamin B-12 (CYANOCOBALAMIN ) 1000 MCG tablet 1,000 mcg, Daily         verapamil  (CALAN -SR) 240 MG CR tablet          UBRELVY  100 MG TABS          traZODone  (DESYREL ) 150 MG tablet          rosuvastatin  (CRESTOR ) 40 MG tablet          Riboflavin 100 MG TABS 100 mg         ondansetron  (ZOFRAN -ODT) 8 MG disintegrating tablet          omeprazole  (PRILOSEC) 40 MG capsule          montelukast  (SINGULAIR ) 10 MG tablet          methylPREDNISolone  (MEDROL  DOSEPAK) 4 MG TBPK tablet          magnesium oxide (MAG-OX) 400 MG tablet 400 mg, 2 times daily      Patient Requested Removal   levothyroxine  (SYNTHROID ) 88 MCG tablet          lamoTRIgine  (LAMICTAL ) 100 MG tablet 100 mg,  2 times daily         hydrocortisone (ANUSOL-HC) 2.5 % rectal cream 2 times daily         fenofibrate  micronized (LOFIBRA) 134 MG capsule          Estradiol  10 MCG TABS vaginal tablet 10 mcg, 2 times weekly         DULoxetine  (CYMBALTA ) 30 MG capsule Daily         cetirizine (ZYRTEC) 10 MG tablet 10 mg, Daily         celecoxib  (CELEBREX ) 50 MG capsule 50 mg, 2 times daily PRN         butalbital -acetaminophen -caffeine  (FIORICET) 50-325-40 MG tablet 1 tablet, Daily PRN         buPROPion  (WELLBUTRIN  XL) 150 MG 24 hr tablet          BREO ELLIPTA  100-25 MCG/ACT AEPB          botulinum toxin Type A  (BOTOX ) injection 300 Units 300 Units, Once         botulinum toxin Type A  (BOTOX ) 100 units SOLR injection 300 Units, Every 3 months         amphetamine -dextroamphetamine  (ADDERALL) 10 MG tablet          ALPRAZolam  (XANAX ) 0.5 MG tablet           albuterol  (PROVENTIL  HFA) 108 (90 Base) MCG/ACT inhaler         Do you have any issues with chronic pain?  Yes Arthritis in neck and lower back.  Name of primary care physician/last physical exam: Dr. Nena, [place]   Allergies: Yes Medication, reactions?  Kiwi Extract  Drug Ingredient Swelling Not Specified  01/12/2021 Past Updates    Other   Swelling Not Specified  12/30/2012 Past Updates  kiwi  Sulfa Antibiotics  Drug Class Hives Not Specified  12/30/2012 Past Updates    Adverse Reactions/Drug Intolerances    Imitrex [Sumatriptan]  Drug Ingredient  Not Specified Intolerance 01/31/2013 Past Updates    Zomig [Zolmitriptan]            Current medications:   zinc gluconate 50 MG tablet 50 mg, Daily Taking Taking Differently Not Taking Unknown          Summary: Take 50 mg by mouth daily., Historical Med Dose, Route, Frequency: 50 mg, Oral, DailyOrdered On: 09/07/2022ReportDx Associated: Long-term: Med Note:        Directions: Take 50 mg by mouth daily. Authorized By: [provider] Dose History     Vitamin D , Ergocalciferol , (DRISDOL ) 1.25 MG (50000 UNIT) CAPS capsule 50,000 Units, Every 7 days Taking Taking Differently Not Taking Unknown          vitamin B-12 (CYANOCOBALAMIN ) 1000 MCG tablet 1,000 mcg, Daily Taking Taking Differently Not Taking Unknown          verapamil  (CALAN -SR) 240 MG CR tablet  Taking Taking Differently Not Taking Unknown          UBRELVY  100 MG TABS  Taking Taking Differently Not Taking Unknown          traZODone  (DESYREL ) 150 MG tablet  Taking Taking Differently Not Taking Unknown          rosuvastatin  (CRESTOR ) 40 MG tablet  Taking Taking Differently Not Taking Unknown          Riboflavin 100 MG TABS 100 mg Taking Taking Differently Not Taking Unknown          ondansetron  (ZOFRAN -ODT) 8 MG disintegrating tablet  Taking Taking Differently Not  Taking Unknown          omeprazole  (PRILOSEC) 40 MG  capsule  Taking Taking Differently Not Taking Unknown          montelukast  (SINGULAIR ) 10 MG tablet  Taking Taking Differently Not Taking Unknown          methylPREDNISolone  (MEDROL  DOSEPAK) 4 MG TBPK tablet  Taking Taking Differently Not Taking Unknown          magnesium oxide (MAG-OX) 400 MG tablet 400 mg, 2 times daily Edit Taking       Patient Requested Removal   levothyroxine  (SYNTHROID ) 88 MCG tablet  Taking Taking Differently Not Taking Unknown          lamoTRIgine  (LAMICTAL ) 100 MG tablet 100 mg, 2 times daily Taking Taking Differently Not Taking Unknown          hydrocortisone (ANUSOL-HC) 2.5 % rectal cream 2 times daily Taking Taking Differently Not Taking Unknown          fenofibrate  micronized (LOFIBRA) 134 MG capsule  Taking Taking Differently Not Taking Unknown          Estradiol  10 MCG TABS vaginal tablet 10 mcg, 2 times weekly Taking Taking Differently Not Taking Unknown          DULoxetine  (CYMBALTA ) 30 MG capsule Daily Taking Taking Differently Not Taking Unknown          cetirizine (ZYRTEC) 10 MG tablet 10 mg, Daily Taking Taking Differently Not Taking Unknown          celecoxib  (CELEBREX ) 50 MG capsule 50 mg, 2 times daily PRN Taking as Needed Taking Differently Not Taking Unknown          butalbital -acetaminophen -caffeine  (FIORICET) 50-325-40 MG tablet 1 tablet, Daily PRN Taking as Needed Taking Differently Not Taking Unknown          buPROPion  (WELLBUTRIN  XL) 150 MG 24 hr tablet  Taking Taking Differently Not Taking Unknown          BREO ELLIPTA  100-25 MCG/ACT AEPB  Taking Taking Differently Not Taking Unknown          botulinum toxin Type A  (BOTOX ) 100 units SOLR injection 300 Units, Every 3 months Taking Taking Differently Not Taking Unknown          amphetamine -dextroamphetamine  (ADDERALL) 10 MG tablet  Taking Taking Differently Not Taking Unknown          ALPRAZolam  (XANAX ) 0.5 MG tablet  Taking Taking  Differently Not Taking Unknown          albuterol  (PROVENTIL  HFA) 108 (90 Base) MCG/ACT inhaler  Taking Taking Differently Not Taking Unknown         Clinic-Administered Medications     botulinum toxin Type A  (BOTOX ) injection 300 Units 300 Units, Once           Prescribed by: Dr. Nena  Is there any history of mental health problems or substance abuse in your family, whom? Yes Aunt attempted suicide, Burnetta Brigham Committed suicide and a cousin did  as well  Has anyone in your family been hospitalized, who, where, length of stay? Yes Aunt, but can't remember how long  Social/family history Have you been married, how many times?  2  Do you have children?  2  How many pregnancies have you had?  3  Who lives in your current household? Patient and her husband  Military history: No NA  Religious/spiritual involvement: Bible Study Group What religion/faith base are you? Baptist  Family of origin (childhood  history) Patient, parents, and younger brother  Where were you born[place] Where did you grow up[place]  How many different homes have you lived? 5  Describe the atmosphere of the household where you grew up: Loving and disciplined  Do you have siblings, step/half siblings, list names, relation, sex, age? Yes Younger brother-Roger 66  Are your parents separated/divorced, when and why? No Remained married till they passed  Are your parents alive? No Parents passed away, Father passed in December 20, 2010, and mother and 19-Dec-2012  Social supports (personal and professional): Husband and friends at [employer] many grades have you completed? some college Did you have any problems in school, what type? No  Medications prescribed for these problems? No   Employment (financial issues): Patient is no longer working due to her stroke. Denied financial issues.   Legal history: Denied   Trauma/Abuse history: Have you ever been exposed to any form of abuse, what  type? No   Have you ever been exposed to something traumatic, describe? Yes Having stroke  Substance use Do you use Caffeine ? Yes Type, frequency? Patient reports having a cup and half daily.  Do you use Nicotine? No Type, frequency, ppd? NA   Do you use Alcohol? No Type, frequency? NA  How old were you went you first tasted alcohol? 20 Was this accepted by your family? Yes  When was your last drink, type, how much? NA  Have you ever used illicit drugs or taken more than prescribed, type, frequency, date of last usage? No NA  Mental Status: General Appearance Siegfried:  Neat Eye Contact:  Good Motor Behavior:  Normal Speech:  Normal Level of Consciousness:  Alert Mood:  Anxious Affect:  Appropriate Anxiety Level:  Moderate Thought Process:  Coherent Thought Content:  WNL Perception:  Normal Judgment:  Good Insight:  Present Cognition:  Orientation time, place, and person  Diagnosis AXIS I Generalized Anxiety Disorder  AXIS II No diagnosis  AXIS III Previous stroke  AXIS IV other psychosocial or environmental problems and problems related to social environment  AXIS V 51-60 moderate symptoms    Risk Assessment: Danger to Self:  No Self-injurious Behavior: No Danger to Others: No Duty to Warn:no Physical Aggression / Violence:No  Access to Firearms a concern: No  Gang Involvement:No   Subjective:   Ruth Gregory participated in person from office, located at [employer]. Ruth Gregory consented to treatment. Therapist participated from office, located at [facility].   Interventions: Cognitive Behavioral Therapy, Dialectical Behavioral Therapy, and Assertiveness/Communication  Diagnosis: Generalized Anxiety Disorder   Damien Junk MSW, LCSW/DATE 11/21/2023   Individualized Treatment Plan Strengths: I am kind and nice and good to others.  Supports: Patient reports her husband is a support for her.   Goal/Needs for Treatment:  In order of  importance to patient 1) I want to learn how to manage my trauma of my stroke as I age and get healthy. 2) I want to learn how to communicate with my children about the future.    Client Statement of Needs: Patient wants to work through her effects of her stroke.   Treatment Level:Moderate-Bi weekly  Symptoms:Anxiety,   Client Treatment Preferences:FACE TO FACE   Healthcare consumer's goal for treatment:  Therapist, Damien Junk MSW, LCSW will support the patient's ability to achieve the goals identified. Cognitive Behavioral Therapy, Assertive Communication/Conflict Resolution Training, Relaxation Training, ACT, Humanistic and other evidenced-based practices will be used to promote progress towards healthy functioning.  Healthcare consumer will: Actively participate in therapy, working towards healthy functioning.    *Justification for Continuation/Discontinuation of Goal: R=Revised, O=Ongoing, A=Achieved, D=Discontinued  Goal 1) I want to learn how to manage my trauma of my stroke as I age and get healthy. Baseline date 11/21/2023: Progress towards goal Ongoing; How Often - Daily Target Date Goal Was reviewed Status Code Progress towards goal/Likert rating  11/20/2024  O Ongoing            Ruth Gregory will focus on setting achievable goals, maintaining routines, and engaging in activities that provide meaning and purpose to your recovery journey, while also utilizing relaxation techniques and medications if recommended by your doctor to manage stress and mood changes.  Seek Professional Support Therapy: Psychotherapy (talk therapy) can help you process the emotional impact of a stroke, identify triggers for PTSD, and develop coping strategies for negative emotions.  Counseling: Counselors, clinical social workers, and marriage and family therapists can provide guidance and support to help you manage anxiety, depression, and stress associated with a stroke.  Medication: In some cases,  a doctor might recommend medication, such as antidepressants, to help manage mood disorders and associated feelings of anxiety or dissociation.  Build a Strong Support System Support Groups: . Connecting with other stroke survivors in support groups can provide encouragement, guidance, and a sense of community, as you can share experiences with people who understand what you're going through.  Friends and Family: . Lean on your existing social network for encouragement and support. Don't hesitate to ask for help.  Set Goals and Find Meaning Meaningful Goals: . Focus on setting realistic and meaningful goals, celebrating small successes to build confidence and maintain motivation.  Purpose: . Find new sources of purpose and contribution, which can be found in volunteer work, hobbies, or connecting with a community or religious group.  Daily Routines: . Establish and maintain predictable routines, such as attending church or visiting familiar places, to create a sense of normalcy and security.  Practice Self-Care Acceptance: Accept that you've had a stroke, but don't confuse acceptance with giving up. This mindset helps you focus on your present circumstances and future recovery.  Relaxation Techniques: Incorporate mindfulness practices like meditation and deep breathing to reduce stress and promote a sense of calm and self-acceptance.  Stay Informed: Educate yourself about stroke and the recovery process to feel more in control and empowered during your journey.   Goal 2) I want to learn how to communicate with my children about the future. Baseline date 11/21/2023: Progress towards goal; Ongoing How Often - Daily Target Date Goal Was reviewed Status Code Progress towards goal  11/20/2024  O Ongoing            Ruth Gregory will be aware that to communicate with your adult children about the future, you should adopt a respectful, transparent, and collaborative approach. Begin with small,  low-stress conversations and focus on your values rather than just the logistics. By leading with your intentions and actively listening to their perspectives, you can address sensitive topics like finances and health while strengthening your family bond.  Plan and Initiate the Conversation Choose the right time and setting. Select a relaxed, private setting to avoid distractions. A family dinner or video call can be effective. Avoid major topics during stressful times or large gatherings. Use a gentle opening. Start with a third-party reference. Mentioning a news story, a book, or a friend's situation can be a natural way to begin. For example, A family's estate  planning story made me realize we should probably talk about our own plans. Outline your intent. Explain the purpose before getting into details. Frame the conversation around providing peace of mind and making a difficult time easier. For instance, This will make sure you know my wishes so you won't have to make tough decisions down the road.  Cover Key Topics Gradually Break down the future into several smaller conversations.  Finances and [employer] key information. Provide an overview of financial assets, liabilities, and insurance policies. Give a general idea of your financial picture. Explain your wishes. If you plan to distribute assets unequally or make other potentially surprising decisions, explain your reasoning to prevent future conflict. Share financial values and the legacy you hope to leave behind. Detail roles and documents. Clarify the location of essential documents like your will, trusts, and powers of attorney. Introduce your children to relevant professionals, such as your financial advisor or lawyer.  Healthcare and End-of-Life Wishes Discuss your medical preferences. Share desires for medical treatment and end-of-life care, such as feelings on life support or other interventions. This will help them  understand your philosophy on medical care. Create advance directives. Formalize your wishes with legal documents like a living will and healthcare proxy. Ensure the relevant child has a copy and understands their role and wishes. Share information about your health. Communicate openly about your health status so children are informed and prepared. This can include updates on your ability to handle daily living activities.  Future Living Arrangements State your preferences. Share desires for where you would like to live as you age. Discuss whether you hope to stay in your current home, downsize, or consider a senior living community. Talk about what if scenarios. Have conversations about potential future needs, such as if you might need home care or temporary living assistance after a health issue. Involve all adult children in discussions about living arrangements to avoid hurt feelings. Set ground rules. If a multigenerational household is a possibility, establish clear expectations for household contributions, daily routines, and privacy beforehand.  Maintain a Healthy Dialogue Listen actively. Listen empathetically without interrupting or preparing a rebuttal. Pay attention to both words and body language. Respect their perspective. Acknowledge and validate your children's feelings, even if you don't agree with them. This shows that you take their concerns seriously and creates a safe space for open communication. Focus on your evolved relationship. Recognize that your adult children are no longer dependents. Show respect for their independent choices and avoid unsolicited advice. Hold regular, small check-ins. The conversation about the future is not a one-time event. Schedule regular, low-stakes conversations to revisit your plans and make adjustments as needed. This normalizes discussion and reduces stress.   This plan has been reviewed and created by the following participants:  This plan will  be reviewed at least every 12 months. Date Behavioral Health Clinician Date Guardian/Patient   11/04/2023  Damien Junk MSW, LCSW 11/04/2023 Verbal Consent Provided

## 2023-12-12 ENCOUNTER — Ambulatory Visit: Admitting: Licensed Clinical Social Worker

## 2023-12-12 DIAGNOSIS — F411 Generalized anxiety disorder: Secondary | ICD-10-CM | POA: Diagnosis not present

## 2023-12-12 NOTE — Progress Notes (Addendum)
 [facility] Counselor/Therapist Progress Note  Patient ID: Ruth Gregory, MRN: [id number]    Date: 12/12/23  Time Spent: 0303  pm - 0401 pm : 58 Minutes  Treatment Type: Individual Therapy.  Reported Symptoms: Patient reports that she had a stroke in 2009. She has a high pressure job and they were paying for her disability. 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 Patient is concerned about her emotional stability and her disability. Patient reports that she is secluded by being home so much. She often pushes herself to do things.   Mental Status Exam: Appearance:  Casual     Behavior: Appropriate  Motor: Normal  Speech/Language:  Clear and Coherent  Affect: Appropriate  Mood: normal  Thought process: flight of ideas  Thought content:   WNL  Sensory/Perceptual disturbances:   WNL  Orientation: oriented to person, place, time/date, situation, day of week, month of year, and year  Attention: Good  Concentration: Good  Memory: WNL  Fund of knowledge:  Good  Insight:   Fair  Judgment:  Fair  Impulse Control: Fair   Risk Assessment: Danger to Self:  No Self-injurious Behavior: No Danger to Others: No Duty to Warn:no Physical Aggression / Violence:No  Access to Firearms a concern: No  Gang Involvement:No   Subjective:   Ruth Gregory participated in person from [facility] with Clinician present. Patient  consented to treatment.    Ruth Gregory presented for her session stating she had her appointment time confused. She reports that because she was so early she and her husband went to lunch. She reports that her husband gets frustrated with her and her not keeping up with appointments. She reports that this has been ongoing since her illness. Ruth Gregory reports that she tries to write things down but can lose it or forget where she placed it. Patient reports that she is also stressed about her two sons who don't speak. Patient  explained the details and at times exhibited flight of ideas in her speech.   Clinician actively listened and provided support via active listening and verbal feedback. Clinician processed with patient her concerns and assisted patient in identifying ideas for problem solving with her sons and the conflict. Clinician encouraged patient to continue to remain neutral and not allow herself to get in the middle. Clinician and patient processed that she and her husband should plan a meeting for the family to attempt to resolve the conflict. Patient reports that the situation adds to her current stress and anxiety and processed with Clinician her desire to have the issue resolved.  Ruth Gregory was engaged and polite and cooperative. Patient enjoys sharing and eager to receive feedback. Ruth Gregory will continue to utilize grounding, deep breathing and reframing to decrease her symptoms of anxiety. Patient is to use CBT, mindfulness and coping skills to help manage decrease symptoms associated with her diagnosis.Treatment planning to be reviewed by 11/20/2024.  Interventions: Cognitive Behavioral Therapy, Dialectical Behavioral Therapy, Assertiveness/Communication, Mindfulness Meditation, and Solution-Oriented/Positive Psychology  Diagnosis: Generalized Anxiety Disorder    Damien Junk MSW, LCSW/DATE 12/12/2023

## 2023-12-16 ENCOUNTER — Encounter: Payer: Self-pay | Admitting: Internal Medicine

## 2023-12-16 ENCOUNTER — Ambulatory Visit: Admitting: Internal Medicine

## 2023-12-16 VITALS — BP 120/74 | HR 90 | Temp 98.0°F | Ht 69.0 in | Wt 153.0 lb

## 2023-12-16 DIAGNOSIS — J4520 Mild intermittent asthma, uncomplicated: Secondary | ICD-10-CM

## 2023-12-16 DIAGNOSIS — J019 Acute sinusitis, unspecified: Secondary | ICD-10-CM

## 2023-12-16 DIAGNOSIS — R051 Acute cough: Secondary | ICD-10-CM

## 2023-12-16 LAB — POC COVID19 BINAXNOW: SARS Coronavirus 2 Ag: NEGATIVE

## 2023-12-16 MED ORDER — LEVOFLOXACIN 500 MG PO TABS: 500.0000 mg | ORAL_TABLET | Freq: Every day | ORAL | 0 refills | Status: AC

## 2023-12-16 NOTE — Progress Notes (Signed)
 Subjective:    Patient ID: Ruth Gregory, female    DOB: 12-Oct-1960, 63 y.o.   MRN: [id number]      HPI [name] is here for  Chief Complaint  Patient presents with   Cough    Cough and headache; Started on Sunday night /Monday morning    Discussed the use of AI scribe software for clinical note transcription with the patient, who gave verbal consent to proceed.  History of Present Illness Ruth Gregory is a 63 year old female with a history of sinus infections and migraines who presents with nasal congestion, sneezing, and headaches.  Symptoms began on Sunday night or Monday morning with sneezing and congestion. Despite using Breo for asthma and a daily allergy tablet, symptoms are worse this season compared to spring. She has a history of frequent sinus infections and nasal polyp removal. She experiences constant nasal congestion with a sensation of post-nasal drip that does not clear, despite frequent blowing. She has been taking DayQuil and NyQuil to manage her symptoms. No fever, chills, ear pain, or sore throat, but sinus pressure and light drainage are present. She denies shortness of breath, wheezing, or significant cough, though her voice is gruffer than usual.  Severe headache pain occurred on Thursday morning, initially feared to be an aneurysm due to her history of stroke. The headache subsided after taking Ubrelvy , which she uses for migraines.  No body aches, lightheadedness, or dizziness.       Medications and allergies reviewed with patient and updated if appropriate.  Current Outpatient Medications on File Prior to Visit  Medication Sig Dispense Refill   albuterol  (PROVENTIL  HFA) 108 (90 Base) MCG/ACT inhaler 1 to 2 inhalations 10-15 minutes apart every 4 hours if needed for asthma rescue 48 g 3   ALPRAZolam  (XANAX ) 0.5 MG tablet TAKE 1/2-1 TABS BY MOUTH AT BEDTIME AS NEEDED FOR SLEEP&MAX 5 DAYS/WEEK TO AVOID ADDICTION&DEMENTIA 30 tablet 0    amphetamine -dextroamphetamine  (ADDERALL) 10 MG tablet Take  1/2 - 1 tablet  1 or 2 x /day  for ADD 60 tablet 0   botulinum toxin Type A  (BOTOX ) 100 units SOLR injection Inject 300 Units into the muscle every 3 (three) months.     BREO ELLIPTA  100-25 MCG/ACT AEPB INHALE 1 PUFF BY MOUTH DAILY 180 each 3   buPROPion  (WELLBUTRIN  XL) 150 MG 24 hr tablet TAKE 1 TABLET EVERY MORNING FOR MOOD. FOCUS & CONCENTRATION 90 tablet 3   butalbital -acetaminophen -caffeine  (FIORICET) 50-325-40 MG tablet TAKE 1 TABLET BY MOUTH DAILY AS NEEDED FOR HEADACHE. DO NOT REFILL IN LESS THAN 30 DAYS, 12 tablet 4   celecoxib  (CELEBREX ) 50 MG capsule Take 1 capsule (50 mg total) by mouth 2 (two) times daily as needed for pain. 60 capsule 3   cetirizine (ZYRTEC) 10 MG tablet Take 10 mg by mouth daily.     DULoxetine  (CYMBALTA ) 30 MG capsule TAKE 1 CAPSULE BY MOUTH EVERY DAY 90 capsule 3   Estradiol  10 MCG TABS vaginal tablet Place 10 mcg vaginally 2 (two) times a week.     fenofibrate  micronized (LOFIBRA) 134 MG capsule TAKE 1 CAPSULE DAILY FOR TRIGLYCERIDES (BLOOD FATS) 90 capsule 3   hydrocortisone (ANUSOL-HC) 2.5 % rectal cream Place rectally 2 (two) times daily.     lamoTRIgine  (LAMICTAL ) 100 MG tablet TAKE 1 TABLET BY MOUTH TWICE A DAY 180 tablet 4   levothyroxine  (SYNTHROID ) 88 MCG tablet TAKE 1 TABLET DAILY ON AN EMPTY STOMACH WITH ONLY WATER FOR 30  MINS& NO ANTACID MEDS, CALCIUM , OR MAGNESIUM FOR 4 HOURS. AVOID BIOTIN 90 tablet 3   methylPREDNISolone  (MEDROL  DOSEPAK) 4 MG TBPK tablet Take as directed 21 tablet 0   montelukast  (SINGULAIR ) 10 MG tablet TAKE 1 TABLET BY MOUTH AT BEDTIME FOR ALLERGIES 90 tablet 1   omeprazole  (PRILOSEC) 40 MG capsule TAKE 1 CAPSULE DAILY TO PREVENT HEARTBURN & INDIGESTION / PATIENT KNOWS TO TAKE BY MOUTH 90 capsule 3   ondansetron  (ZOFRAN -ODT) 8 MG disintegrating tablet DISSOLVE 1 TABLET UNDER TONGUE EVERY 6 TO 8 HOURS FOR NAUSEA OR VOMITTING 30 tablet 3   Riboflavin 100 MG TABS Take 100 mg by  mouth. bid     rosuvastatin  (CRESTOR ) 40 MG tablet TAKE 1 TABLET BY MOUTH EVERY OTHER DAY FOR CHOLESTEROL 45 tablet 3   traZODone  (DESYREL ) 150 MG tablet Take  1/2 to 1 tablet   1 to 2 hours   before Bedtime if Needed forSleep  ( Replaces Xanax  ) 90 tablet 1   UBRELVY  100 MG TABS TAKE 1 TABLET BY MOUTH AS NEEDED AS DIRECTED 10 tablet 5   verapamil  (CALAN -SR) 240 MG CR tablet TAKE 1 TABLET DAILY WITH FOOD FOR BLOOD PRESSURE & MIGRAINE PREVENTION 90 tablet 3   vitamin B-12 (CYANOCOBALAMIN ) 1000 MCG tablet Take 1,000 mcg by mouth daily.     Vitamin D , Ergocalciferol , (DRISDOL ) 1.25 MG (50000 UNIT) CAPS capsule Take 1 capsule (50,000 Units total) by mouth every 7 (seven) days. 12 capsule 1   zinc gluconate 50 MG tablet Take 50 mg by mouth daily.     magnesium oxide (MAG-OX) 400 MG tablet Take 400 mg by mouth 2 (two) times daily.      Current Facility-Administered Medications on File Prior to Visit  Medication Dose Route Frequency Provider Last Rate Last Admin   botulinum toxin Type A  (BOTOX ) injection 300 Units  300 Units Intramuscular Once         Review of Systems  Constitutional:  Negative for chills and fever.  HENT:  Positive for congestion, postnasal drip, sinus pressure, sneezing and voice change. Negative for ear pain and sore throat.   Respiratory:  Positive for cough (mild - PND). Negative for shortness of breath and wheezing.   Gastrointestinal:  Positive for constipation. Negative for diarrhea and nausea.  Musculoskeletal:  Negative for myalgias.  Neurological:  Positive for headaches. Negative for dizziness and light-headedness.       Objective:   Vitals:   12/16/23 1352  BP: 120/74  Pulse: 90  Temp: 98 F (36.7 C)  SpO2: 97%   BP Readings from Last 3 Encounters:  12/16/23 120/74  11/16/23 125/78  09/21/23 128/80   Wt Readings from Last 3 Encounters:  12/16/23 153 lb (69.4 kg)  09/21/23 155 lb (70.3 kg)  09/14/23 155 lb 6.4 oz (70.5 kg)   Body mass index is 22.59  kg/m.    Physical Exam Constitutional:      General: She is not in acute distress.    Appearance: Normal appearance. She is not ill-appearing.  HENT:     Head: Normocephalic and atraumatic.     Right Ear: Tympanic membrane, ear canal and external ear normal.     Left Ear: Tympanic membrane, ear canal and external ear normal.     Mouth/Throat:     Mouth: Mucous membranes are moist.     Pharynx: No oropharyngeal exudate or posterior oropharyngeal erythema.  Eyes:     Conjunctiva/sclera: Conjunctivae normal.  Cardiovascular:     Rate  and Rhythm: Normal rate and regular rhythm.  Pulmonary:     Effort: Pulmonary effort is normal. No respiratory distress.     Breath sounds: Normal breath sounds. No wheezing or rales.  Musculoskeletal:     Cervical back: Neck supple. No tenderness.  Lymphadenopathy:     Cervical: No cervical adenopathy.  Skin:    General: Skin is warm and dry.  Neurological:     Mental Status: She is alert.            Assessment & Plan:    Assessment and Plan Assessment & Plan Acute sinus infection Levaquin  chosen due to allergy history and previous tolerance - Prescribe Levaquin  500 mg daily x 10 days.  Discussed possible side effects-advised to stop if she experiences any symptoms consistent with tendinitis or neuropathy - Continue over-the-counter cold medications for symptom relief.,  Can try Mucinex. - Instruct to contact via MyChart if symptoms do not improve or concerning symptoms develop.  Has taken prednisone  in the past, but would ideally like to avoid that  Asthma, without exacerbation Managed with Breo, albuterol  as needed and daily allergy tablets. Continue above-given no exacerbation we will hold off on any prednisone 

## 2023-12-16 NOTE — Patient Instructions (Addendum)
        Medications changes include :   levaquin  500 mg once a day for 10 days      Return if symptoms worsen or fail to improve.

## 2023-12-17 ENCOUNTER — Encounter: Payer: Self-pay | Admitting: Internal Medicine

## 2023-12-19 ENCOUNTER — Ambulatory Visit: Payer: Self-pay

## 2023-12-19 NOTE — Telephone Encounter (Signed)
 FYI Only or Action Required?: Action required by provider: clinical question for provider and update on patient condition. Requesting different antibiotic Patient was last seen in primary care on 12/16/2023 by Geofm Glade PARAS, MD.  Called Nurse Triage reporting Medication Reaction.  Symptoms began several days ago.  Interventions attempted: Nothing.  Symptoms are: gradually improving.  Triage Disposition: Call PCP When Office is Open  Patient/caregiver understands and will follow disposition?: Yes  Copied from CRM [id number]. Topic: Clinical - Medication Question >> Dec 19, 2023  3:24 PM Viola F wrote: Reason for CRM: Patient has sinus infection and says the Levofloxacin  (LEVAQUIN ) 500 MG tablet causes tendonitis in her left side and would like a alternative medication. Please call [phone] (M) Reason for Disposition  [1] Caller has NON-URGENT medicine question about med that PCP prescribed AND [2] triager unable to answer question  Answer Assessment - Initial Assessment Questions 1. NAME of MEDICINE: What medicine(s) are you calling about?     Levaquin  2. QUESTION: What is your question? (e.g., double dose of medicine, side effect)She would like a different antibiotic 3. PRESCRIBER: Who prescribed the medicine? Reason: if prescribed by specialist, call should be referred to that group.     Dr. Geofm 4. SYMPTOMS: Do you have any symptoms? If Yes, ask: What symptoms are you having?  How bad are the symptoms (e.g., mild, moderate, severe)   Medication caused left foot, ankle, and thigh pain.  Also pain to left arm Patient only took one dose and has not taken anymore.  5. PREGNANCY:  Is there any chance that you are pregnant? When was your last menstrual period?     N/a  Protocols used: Medication Question Call-A-AH

## 2023-12-20 ENCOUNTER — Telehealth: Payer: Self-pay | Admitting: *Deleted

## 2023-12-20 ENCOUNTER — Encounter: Payer: Self-pay | Admitting: Family Medicine

## 2023-12-20 DIAGNOSIS — F988 Other specified behavioral and emotional disorders with onset usually occurring in childhood and adolescence: Secondary | ICD-10-CM

## 2023-12-20 MED ORDER — DOXYCYCLINE HYCLATE 100 MG PO TABS: 100.0000 mg | ORAL_TABLET | Freq: Two times a day (BID) | ORAL | 0 refills | Status: AC

## 2023-12-20 NOTE — Telephone Encounter (Signed)
 Spoke with the patient and informed her of the Mychart message previously sent by Dr Geofm (prescriber) in which she changed the Rx to Doxycycline .

## 2023-12-20 NOTE — Telephone Encounter (Signed)
 See prior phone note.

## 2023-12-20 NOTE — Telephone Encounter (Signed)
 Copied from CRM [id number]. Topic: General - Other >> Dec 19, 2023 11:23 AM Rea ORN wrote: Reason for CRM: Pt called to follow up on message that was sent over the weekend regarding possible rx side effects. Pt was advised that messages from the weekend are taken care of Monday morning when staff return. Please call pt regarding Levofloxacin .

## 2023-12-22 MED ORDER — AMPHETAMINE-DEXTROAMPHETAMINE 10 MG PO TABS: ORAL_TABLET | ORAL | 0 refills | Status: DC

## 2023-12-28 ENCOUNTER — Other Ambulatory Visit: Payer: Self-pay | Admitting: Family Medicine

## 2023-12-28 DIAGNOSIS — J3089 Other allergic rhinitis: Secondary | ICD-10-CM

## 2023-12-30 ENCOUNTER — Encounter: Payer: Self-pay | Admitting: Family Medicine

## 2023-12-30 DIAGNOSIS — F419 Anxiety disorder, unspecified: Secondary | ICD-10-CM

## 2024-01-02 ENCOUNTER — Ambulatory Visit: Admitting: Family Medicine

## 2024-01-02 MED ORDER — ALPRAZOLAM 0.5 MG PO TABS: ORAL_TABLET | ORAL | 0 refills | Status: AC

## 2024-01-11 ENCOUNTER — Ambulatory Visit: Admitting: Licensed Clinical Social Worker

## 2024-01-13 ENCOUNTER — Other Ambulatory Visit: Payer: Self-pay | Admitting: Family Medicine

## 2024-01-13 NOTE — Telephone Encounter (Unsigned)
 Copied from CRM [id number]. Topic: Clinical - Medication Refill >> Jan 13, 2024  1:36 PM Drema MATSU wrote: Medication: verapamil  (CALAN -SR) 240 MG CR tablet and ondansetron  (ZOFRAN -ODT) 8 MG disintegrating tablet  Has the patient contacted their pharmacy? Yes (Agent: If no, request that the patient contact the pharmacy for the refill. If patient does not wish to contact the pharmacy document the reason why and proceed with request.) can't fill it call new provider  (Agent: If yes, when and what did the pharmacy advise?)  This is the patient's preferred pharmacy:  [id number] - [place], [place] - [address] [place] [address] Phone: [phone] Fax: [phone]  Is this the correct pharmacy for this prescription? Yes If no, delete pharmacy and type the correct one.   Has the prescription been filled recently? No  Is the patient out of the medication? Yes  Has the patient been seen for an appointment in the last year OR does the patient have an upcoming appointment? Yes  Can we respond through MyChart? Yes  Agent: Please be advised that Rx refills may take up to 3 business days. We ask that you follow-up with your pharmacy.

## 2024-01-16 MED ORDER — ONDANSETRON 8 MG PO TBDP
ORAL_TABLET | ORAL | 3 refills | Status: AC
Start: 2024-01-16 — End: ?

## 2024-01-16 MED ORDER — VERAPAMIL HCL ER 240 MG PO TBCR: EXTENDED_RELEASE_TABLET | ORAL | 3 refills | Status: AC

## 2024-01-17 ENCOUNTER — Encounter: Payer: Self-pay | Admitting: Family Medicine

## 2024-01-24 ENCOUNTER — Encounter: Payer: Self-pay | Admitting: Family Medicine

## 2024-01-24 ENCOUNTER — Ambulatory Visit: Admitting: Family Medicine

## 2024-01-24 VITALS — BP 132/88 | HR 75 | Temp 98.3°F | Ht 67.75 in | Wt 151.8 lb

## 2024-01-24 DIAGNOSIS — Z0000 Encounter for general adult medical examination without abnormal findings: Secondary | ICD-10-CM | POA: Diagnosis not present

## 2024-01-24 DIAGNOSIS — Z124 Encounter for screening for malignant neoplasm of cervix: Secondary | ICD-10-CM

## 2024-01-24 DIAGNOSIS — Z23 Encounter for immunization: Secondary | ICD-10-CM | POA: Diagnosis not present

## 2024-01-24 DIAGNOSIS — Z131 Encounter for screening for diabetes mellitus: Secondary | ICD-10-CM | POA: Diagnosis not present

## 2024-01-24 DIAGNOSIS — R0989 Other specified symptoms and signs involving the circulatory and respiratory systems: Secondary | ICD-10-CM

## 2024-01-24 DIAGNOSIS — E782 Mixed hyperlipidemia: Secondary | ICD-10-CM

## 2024-01-24 DIAGNOSIS — E032 Hypothyroidism due to medicaments and other exogenous substances: Secondary | ICD-10-CM

## 2024-01-24 DIAGNOSIS — F988 Other specified behavioral and emotional disorders with onset usually occurring in childhood and adolescence: Secondary | ICD-10-CM

## 2024-01-24 LAB — COMPREHENSIVE METABOLIC PANEL WITH GFR
ALT: 30 U/L (ref 0–35)
AST: 21 U/L (ref 0–37)
Albumin: 4.8 g/dL (ref 3.5–5.2)
Alkaline Phosphatase: 52 U/L (ref 39–117)
BUN: 14 mg/dL (ref 6–23)
CO2: 29 meq/L (ref 19–32)
Calcium: 9.8 mg/dL (ref 8.4–10.5)
Chloride: 105 meq/L (ref 96–112)
Creatinine, Ser: 0.75 mg/dL (ref 0.40–1.20)
GFR: 83.66 mL/min (ref >60.00)
Glucose, Bld: 90 mg/dL (ref 70–99)
Potassium: 4.2 meq/L (ref 3.5–5.1)
Sodium: 141 meq/L (ref 135–145)
Total Bilirubin: 0.5 mg/dL (ref 0.2–1.2)
Total Protein: 7.1 g/dL (ref 6.0–8.3)

## 2024-01-24 LAB — LIPID PANEL
Cholesterol: 160 mg/dL (ref 0–200)
HDL: 61.2 mg/dL (ref >39.00)
LDL Cholesterol: 70 mg/dL (ref 0–99)
NonHDL: 98.62
Total CHOL/HDL Ratio: 3
Triglycerides: 144 mg/dL (ref 0.0–149.0)
VLDL: 28.8 mg/dL (ref 0.0–40.0)

## 2024-01-24 LAB — TSH: TSH: 0.87 u[IU]/mL (ref 0.35–5.50)

## 2024-01-24 LAB — HEMOGLOBIN A1C: Hgb A1c MFr Bld: 5.9 % (ref 4.6–6.5)

## 2024-01-24 MED ORDER — FENOFIBRATE MICRONIZED 134 MG PO CAPS: ORAL_CAPSULE | ORAL | 3 refills | Status: AC

## 2024-01-24 MED ORDER — BUPROPION HCL ER (XL) 150 MG PO TB24: ORAL_TABLET | ORAL | 3 refills | Status: AC

## 2024-01-24 NOTE — Patient Instructions (Signed)
 Prevnar - 20 immunization is due  Shingrix vaccination series

## 2024-01-24 NOTE — Progress Notes (Unsigned)
 Subjective:   Ruth Gregory is a 63 y.o. female who presents for Medicare Annual (Subsequent) preventive examination.  Visit Complete: In person  Patient Medicare AWV questionnaire was completed by the patient on 01/24/2024; I have confirmed that all information answered by patient is correct and no changes since this date.  Cardiac Risk Factors include: advanced age (>56men, >33 women);dyslipidemia;hypertension     Objective:    Today's Vitals   01/24/24 1307  BP: 132/88  Pulse: 75  Temp: 98.3 F (36.8 C)  TempSrc: Oral  SpO2: 98%  Weight: 151 lb 12.8 oz (68.9 kg)  Height: 5' 7.75 (1.721 m)   Body mass index is 23.25 kg/m.     01/24/2024    1:31 PM 02/01/2022   12:05 PM 03/21/2020   10:33 AM 01/08/2013   10:47 AM 01/01/2013    9:24 AM  Advanced Directives  Does Patient Have a Medical Advance Directive? No No No Patient does not have advance directive  Patient does not have advance directive   Would patient like information on creating a medical advance directive? No - Patient declined Yes (MAU/Ambulatory/Procedural Areas - Information given) No - Patient declined    Pre-existing out of facility DNR order (yellow form or pink MOST form)    No       Data saved with a previous flowsheet row definition    Current Medications (verified) Outpatient Encounter Medications as of 01/24/2024  Medication Sig   albuterol  (PROVENTIL  HFA) 108 (90 Base) MCG/ACT inhaler 1 to 2 inhalations 10-15 minutes apart every 4 hours if needed for asthma rescue   ALPRAZolam  (XANAX ) 0.5 MG tablet TAKE 1/2-1 TABS BY MOUTH AT BEDTIME AS NEEDED FOR SLEEP   amphetamine -dextroamphetamine  (ADDERALL) 10 MG tablet Take  1/2 - 1 tablet  1 or 2 x /day  for ADD   botulinum toxin Type A  (BOTOX ) 100 units SOLR injection Inject 300 Units into the muscle every 3 (three) months.   BREO ELLIPTA  100-25 MCG/ACT AEPB INHALE 1 PUFF BY MOUTH DAILY   butalbital -acetaminophen -caffeine  (FIORICET) 50-325-40 MG  tablet TAKE 1 TABLET BY MOUTH DAILY AS NEEDED FOR HEADACHE. DO NOT REFILL IN LESS THAN 30 DAYS,   celecoxib  (CELEBREX ) 50 MG capsule Take 1 capsule (50 mg total) by mouth 2 (two) times daily as needed for pain.   cetirizine (ZYRTEC) 10 MG tablet Take 10 mg by mouth daily.   DULoxetine  (CYMBALTA ) 30 MG capsule TAKE 1 CAPSULE BY MOUTH EVERY DAY   Estradiol  10 MCG TABS vaginal tablet Place 10 mcg vaginally 2 (two) times a week.   hydrocortisone (ANUSOL-HC) 2.5 % rectal cream Place rectally 2 (two) times daily.   lamoTRIgine  (LAMICTAL ) 100 MG tablet TAKE 1 TABLET BY MOUTH TWICE A DAY   levothyroxine  (SYNTHROID ) 88 MCG tablet TAKE 1 TABLET DAILY ON AN EMPTY STOMACH WITH ONLY WATER FOR 30 MINS& NO ANTACID MEDS, CALCIUM , OR MAGNESIUM FOR 4 HOURS. AVOID BIOTIN   montelukast  (SINGULAIR ) 10 MG tablet TAKE 1 TABLET BY MOUTH AT BEDTIME FOR ALLERGIES   omeprazole  (PRILOSEC) 40 MG capsule TAKE 1 CAPSULE DAILY TO PREVENT HEARTBURN & INDIGESTION / PATIENT KNOWS TO TAKE BY MOUTH   ondansetron  (ZOFRAN -ODT) 8 MG disintegrating tablet Dissolve 1 tablet under tongue every 6 to 8 hours for nausea  or vomitting   Riboflavin 100 MG TABS Take 100 mg by mouth. bid   rosuvastatin  (CRESTOR ) 40 MG tablet TAKE 1 TABLET BY MOUTH EVERY OTHER DAY FOR CHOLESTEROL   traZODone  (DESYREL ) 150 MG  tablet Take  1/2 to 1 tablet   1 to 2 hours   before Bedtime if Needed forSleep  ( Replaces Xanax  )   UBRELVY  100 MG TABS TAKE 1 TABLET BY MOUTH AS NEEDED AS DIRECTED   verapamil  (CALAN -SR) 240 MG CR tablet TAKE 1 TABLET DAILY WITH FOOD FOR BLOOD PRESSURE & MIGRAINE PREVENTION   vitamin B-12 (CYANOCOBALAMIN ) 1000 MCG tablet Take 1,000 mcg by mouth daily.   Vitamin D , Ergocalciferol , (DRISDOL ) 1.25 MG (50000 UNIT) CAPS capsule Take 1 capsule (50,000 Units total) by mouth every 7 (seven) days.   zinc gluconate 50 MG tablet Take 50 mg by mouth daily.   [DISCONTINUED] buPROPion  (WELLBUTRIN  XL) 150 MG 24 hr tablet TAKE 1 TABLET EVERY MORNING FOR  MOOD. FOCUS & CONCENTRATION   [DISCONTINUED] fenofibrate  micronized (LOFIBRA) 134 MG capsule TAKE 1 CAPSULE DAILY FOR TRIGLYCERIDES (BLOOD FATS)   buPROPion  (WELLBUTRIN  XL) 150 MG 24 hr tablet TAKE 1 TABLET EVERY MORNING FOR MOOD. FOCUS & CONCENTRATION   fenofibrate  micronized (LOFIBRA) 134 MG capsule Take     1 capsule     Daily        for Triglycerides (Blood Fats)   [DISCONTINUED] methylPREDNISolone  (MEDROL  DOSEPAK) 4 MG TBPK tablet Take as directed   Facility-Administered Encounter Medications as of 01/24/2024  Medication   botulinum toxin Type A  (BOTOX ) injection 300 Units    Allergies (verified) Penicillins, Imitrex [sumatriptan], Kiwi extract, Other, Sulfa antibiotics, and Zomig [zolmitriptan]   History: Past Medical History:  Diagnosis Date   Anemia    Anxiety    Asthma    Chronic heartburn    Depression    Elevated cholesterol    on Crestor    Headache(784.0)    Hypertension    Hypothyroidism    S/P thyroidectomy   Migraine    Occlusion and stenosis of carotid artery with cerebral infarction    Spasm of muscle    Unspecified cerebral artery occlusion with cerebral infarction    Vitamin D  deficiency    Past Surgical History:  Procedure Laterality Date   AUGMENTATION MAMMAPLASTY Bilateral 2000   COSMETIC SURGERY     CYSTOCELE REPAIR N/A 01/08/2013   Procedure: ANTERIOR REPAIR (CYSTOCELE);  Surgeon: Charlie CHRISTELLA Croak, MD;  Location: [facility];  Service: Gynecology;  Laterality: N/A;   NASAL SINUS SURGERY     VAGINAL HYSTERECTOMY     Family History  Problem Relation Age of Onset   Heart disease Mother    Peptic Ulcer Disease Mother    Heart disease Father    Parkinson's disease Father    Social History   Socioeconomic History   Marital status: Married    Spouse name: Marcey   Number of children: 2   Years of education: college   Highest education level: Not on file  Occupational History   Occupation: DESIGNER    Employer: S[employer]F  Tobacco Use   Smoking  status: Never   Smokeless tobacco: Never  Substance and Sexual Activity   Alcohol use: Yes    Comment: occassionally drinks a glass of wine   Drug use: No   Sexual activity: Not on file  Other Topics Concern   Not on file  Social History Narrative   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 Patient is married and lives with her husband. Patient has college education.   Right handed.   Caffeine - two cups daily.   Social Drivers of Health   Financial Resource Strain: Low Risk  (01/24/2024)  Overall Financial Resource Strain (CARDIA)    Difficulty of Paying Living Expenses: Not hard at all  Food Insecurity: No Food Insecurity (01/24/2024)   Hunger Vital Sign    Worried About Running Out of Food in the Last Year: Never true    Ran Out of Food in the Last Year: Never true  Transportation Needs: No Transportation Needs (01/24/2024)   PRAPARE - Administrator, Civil Service (Medical): No    Lack of Transportation (Non-Medical): No  Physical Activity: Inactive (01/24/2024)   Exercise Vital Sign    Days of Exercise per Week: 0 days    Minutes of Exercise per Session: 0 min  Stress: Stress Concern Present (01/24/2024)   [employer] of Occupational Health - Occupational Stress Questionnaire    Feeling of Stress: Rather much  Social Connections: Socially Integrated (01/24/2024)   Social Connection and Isolation Panel    Frequency of Communication with Friends and Family: More than three times a week    Frequency of Social Gatherings with Friends and Family: Not on file    Attends Religious Services: More than 4 times per year    Active Member of [employer] or Organizations: Yes    Attends Engineer, Structural: More than 4 times per year    Marital Status: Married    Tobacco Counseling Counseling given: Not Answered Pt is a non smoker, counseling not needed  Clinical Intake:  Pre-visit preparation completed: No  Pain : No/denies pain      BMI - recorded: 23 Nutritional Status: BMI of 19-24  Normal Nutritional Risks: Unintentional weight loss Diabetes: No     Interpreter Needed?: No      Activities of Daily Living    01/24/2024    1:27 PM  In your present state of health, do you have any difficulty performing the following activities:  Hearing? 0  Comment has been to an audiologist, was told her hearing is normal  Vision? 1  Difficulty concentrating or making decisions? 0  Walking or climbing stairs? 1  Comment history of CVA in the past, has residual left foot symptoms  Dressing or bathing? 0  Doing errands, shopping? 0  Preparing Food and eating ? N  Using the Toilet? N  In the past six months, have you accidently leaked urine? N  Do you have problems with loss of bowel control? N  Managing your Medications? N  Managing your Finances? N  Housekeeping or managing your Housekeeping? N    Patient Care Team: Ozell Heron HERO, MD as PCP - General (Family Medicine) Onita Duos, MD as Consulting Physician (Neurology) Tobie Gordy POUR, MD as Consulting Physician (Nephrology) Dyane Rush, MD (Inactive) as Consulting Physician (Gastroenterology)  Indicate any recent Medical Services you may have received from other than [facility] providers in the past year (date may be approximate).     Assessment:   This is a routine wellness examination for Ruth Gregory.  Hearing/Vision screen No results found.   Goals Addressed   None    Depression Screen    01/24/2024    1:01 PM 12/16/2023    2:08 PM 09/14/2023    8:56 AM 05/26/2022    9:56 PM 03/10/2021   10:01 PM 11/02/2020   11:16 PM 03/13/2020   11:39 PM  PHQ 2/9 Scores  PHQ - 2 Score 0 0 0 0 0 0 0  PHQ- 9 Score 2  2        Fall Risk    01/24/2024  1:00 PM 12/16/2023    2:07 PM 05/26/2022    9:56 PM 03/10/2021   10:00 PM 11/02/2020   11:16 PM  Fall Risk   Falls in the past year? 0 0 0 0 0  Number falls in past yr: 0 0     Injury with Fall? 0 0     Risk for  fall due to : Impaired balance/gait No Fall Risks No Fall Risks No Fall Risks No Fall Risks  Follow up Falls evaluation completed Falls evaluation completed Falls evaluation completed;Education provided;Falls prevention discussed Falls evaluation completed;Falls prevention discussed;Education provided  Falls evaluation completed;Falls prevention discussed;Education provided      Data saved with a previous flowsheet row definition    MEDICARE RISK AT HOME: Medicare Risk at Home Any stairs in or around the home?: Yes If so, are there any without handrails?: No Home free of loose throw rugs in walkways, pet beds, electrical cords, etc?: No Adequate lighting in your home to reduce risk of falls?: Yes Life alert?: No Use of a cane, walker or w/c?: No Grab bars in the bathroom?: Yes Shower chair or bench in shower?: No Elevated toilet seat or a handicapped toilet?: Yes  TIMED UP AND GO:  Was the test performed?  Yes  Length of time to ambulate 10 feet: 6 sec Gait steady and fast without use of assistive device    Cognitive Function:        01/24/2024    1:34 PM  6CIT Screen  What Year? 0 points  What month? 0 points  What time? 0 points  Count back from 20 0 points  Months in reverse 0 points  Repeat phrase 2 points  Total Score 2 points    Immunizations Immunization History  Administered Date(s) Administered   Fluzone  Influenza virus vaccine,trivalent (IIV3), split virus 01/21/2023   INFLUENZA, HIGH DOSE SEASONAL PF 01/24/2024   Influenza Inj Mdck Quad With Preservative 01/20/2017, 01/09/2018, 01/10/2019, 01/25/2020   Influenza Split 01/24/2014   Influenza, Seasonal, Injecte, Preservative Fre 02/09/2016   Influenza,inj,Quad PF,6+ Mos 03/10/2021, 02/08/2022   Influenza-Unspecified 11/28/2011   PFIZER(Purple Top)SARS-COV-2 Vaccination 06/11/2019, 07/04/2019, 03/06/2020   PPD Test 04/04/2013, 04/22/2015, 05/18/2016, 06/20/2017, 07/27/2018, 11/03/2020, 11/03/2021, 01/21/2023    Pneumococcal Polysaccharide-23 03/29/1998   Td 03/29/2002   Tdap 05/18/2016    TDAP status: Up to date  Flu Vaccine status: Completed at today's visit  Pneumococcal vaccine status: Due, Education has been provided regarding the importance of this vaccine. Advised may receive this vaccine at local pharmacy or Health Dept. Aware to provide a copy of the vaccination record if obtained from local pharmacy or Health Dept. Verbalized acceptance and understanding.  Covid-19 vaccine status: Completed vaccines  Qualifies for Shingles Vaccine? Yes   Zostavax completed No   Shingrix Completed?: No.    Education has been provided regarding the importance of this vaccine. Patient has been advised to call insurance company to determine out of pocket expense if they have not yet received this vaccine. Advised may also receive vaccine at local pharmacy or Health Dept. Verbalized acceptance and understanding.  Screening Tests Health Maintenance  Topic Date Due   Zoster Vaccines- Shingrix (1 of 2) Never done   Pneumococcal Vaccine: 50+ Years (2 of 2 - PCV) 03/30/1999   Cervical Cancer Screening (HPV/Pap Cotest)  05/07/2017   COVID-19 Vaccine (4 - 2025-26 season) 11/28/2023   Medicare Annual Wellness (AWV)  01/23/2025   Mammogram  04/14/2025   DTaP/Tdap/Td (3 - Td or Tdap) 05/18/2026  Colonoscopy  01/10/2031   Influenza Vaccine  Completed   DEXA SCAN  Completed   Hepatitis C Screening  Completed   HIV Screening  Completed   Hepatitis B Vaccines 19-59 Average Risk  Aged Out   Meningococcal B Vaccine  Aged Out    Health Maintenance  Health Maintenance Due  Topic Date Due   Zoster Vaccines- Shingrix (1 of 2) Never done   Pneumococcal Vaccine: 50+ Years (2 of 2 - PCV) 03/30/1999   Cervical Cancer Screening (HPV/Pap Cotest)  05/07/2017   COVID-19 Vaccine (4 - 2025-26 season) 11/28/2023    Colorectal cancer screening: Type of screening: Colonoscopy. Completed 2022. Repeat every 10  years  Mammogram status: Completed 2025. Repeat every year  Bone Density status: Completed 2021. Results reflect: Bone density results: OSTEOPENIA. Repeat every 4 years.  Lung Cancer Screening: (Low Dose CT Chest recommended if Age 12-80 years, 20 pack-year currently smoking OR have quit w/in 15years.) does not qualify.   Lung Cancer Screening Referral: N/A  Additional Screening:  Hepatitis C Screening: does qualify; Completed 2015  Vision Screening: Recommended annual ophthalmology exams for early detection of glaucoma and other disorders of the eye. Is the patient up to date with their annual eye exam?  Yes  Who is the provider or what is the name of the office in which the patient attends annual eye exams? Dr. Robinson If pt is not established with a provider, would they like to be referred to a provider to establish care? No .   Dental Screening: Recommended annual dental exams for proper oral hygiene  Community Resource Referral / Chronic Care Management: CRR required this visit?  No   CCM required this visit?  No     Plan:     I have personally reviewed and noted the following in the patient's chart:   Medical and social history Use of alcohol, tobacco or illicit drugs  Current medications and supplements including opioid prescriptions. Patient is not currently taking opioid prescriptions. Functional ability and status Nutritional status Physical activity Advanced directives List of other physicians Hospitalizations, surgeries, and ER visits in previous 12 months Vitals Screenings to include cognitive, depression, and falls Referrals and appointments  In addition, I have reviewed and discussed with patient certain preventive protocols, quality metrics, and best practice recommendations. A written personalized care plan for preventive services as well as general preventive health recommendations were provided to patient.     Heron CHRISTELLA Sharper, MD   01/27/2024    After Visit Summary: (In Person-Printed) AVS printed and given to the patient  Nurse Notes: N/A

## 2024-01-25 ENCOUNTER — Ambulatory Visit: Payer: Self-pay | Admitting: Family Medicine

## 2024-01-30 ENCOUNTER — Encounter: Payer: Self-pay | Admitting: Radiology

## 2024-02-01 ENCOUNTER — Encounter: Payer: Medicare Other | Admitting: Internal Medicine

## 2024-02-07 ENCOUNTER — Ambulatory Visit: Admitting: Licensed Clinical Social Worker

## 2024-02-07 DIAGNOSIS — F411 Generalized anxiety disorder: Secondary | ICD-10-CM

## 2024-02-07 NOTE — Progress Notes (Signed)
 [name] Counselor/Therapist Progress Note  Patient ID: Ruth Gregory, MRN: [id number]    Date: 02/07/24  Time Spent: 0904  am - 1000 am : 56 Minutes  Treatment Type: Individual Therapy.  Reported Symptoms: Patient reports that she had a stroke in 2009. She has a high pressure job and they were paying for her disability. 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 Patient is concerned about her emotional stability and her disability. Patient reports that she is secluded by being home so much. She often pushes herself to do things.    Mental Status Exam: Appearance:  Casual     Behavior: Appropriate  Motor: Normal  Speech/Language:  Clear and Coherent  Affect: Appropriate  Mood: normal  Thought process: flight of ideas  Thought content:   WNL  Sensory/Perceptual disturbances:   WNL  Orientation: oriented to person, place, time/date, situation, day of week, month of year, and year  Attention: Good  Concentration: Good  Memory: WNL  Fund of knowledge:  Good  Insight:   Fair  Judgment:  Fair  Impulse Control: Fair    Risk Assessment: Danger to Self:  No Self-injurious Behavior: No Danger to Others: No Duty to Warn:no Physical Aggression / Violence:No  Access to Firearms a concern: No  Gang Involvement:No    Subjective:    Ruth Gregory participated in person from [facility] with Clinician present. Patient  consented to treatment.   Ruth Gregory for her session stating she has been losing weight and not knowing why. Ruth Gregory states that she went to her doctor to see if something is going on. She reports that her ovarian ultrasound was good and she feels that it must be something else. She reports that she had thought that it could be ovarian cancer. She states that it has been determined that it was related to her irritable bowel. Patient reports concerns for her brother as he is struggling with problems due to his  34 years of service in [employer]. Patient states that she is concerned about talking too much and running friends off. She states that her seizures have affected her personality. She reports that she is angry at times due to her losing her job after the seizures.   Clinician actively listened and provided support via active listening and verbal feedback. Clinician processed with patient her concerns and assisted patient in identifying ideas for improving her overall mental health. We discussed problem solving with her anxiety. Clinician encouraged patient to continue to remain aware of her physical health.Clinician and patient processed that she must be proactive with her health and not be afraid of being transparent.  Patient reports that her health and her frustration over her  situation adds to her current stress and anxiety and processed with Clinician her desire to be more mindful.   Ruth Gregory was engaged and was polite and cooperative. Patient enjoys sharing and is eager to receive feedback. Ruth Gregory will continue to utilize grounding, deep breathing and reframing to decrease her symptoms of anxiety. Patient is to use CBT, mindfulness and coping skills to help manage decrease symptoms associated with her diagnosis.Treatment planning to be reviewed by 11/20/2024.   Interventions: Cognitive Behavioral Therapy, Dialectical Behavioral Therapy, Assertiveness/Communication, Mindfulness Meditation, and Solution-Oriented/Positive Psychology   Diagnosis: Generalized Anxiety Disorder         Damien Junk MSW, LCSW/DATE 02/07/2024

## 2024-02-08 ENCOUNTER — Encounter: Payer: Self-pay | Admitting: Family Medicine

## 2024-02-08 DIAGNOSIS — F988 Other specified behavioral and emotional disorders with onset usually occurring in childhood and adolescence: Secondary | ICD-10-CM

## 2024-02-09 MED ORDER — AMPHETAMINE-DEXTROAMPHETAMINE 10 MG PO TABS: ORAL_TABLET | ORAL | 0 refills | Status: DC

## 2024-02-13 ENCOUNTER — Telehealth: Payer: Self-pay | Admitting: Neurology

## 2024-02-13 NOTE — Telephone Encounter (Signed)
 Submitted auth request to transition pt to [facility], status is pending. Key: [id number]

## 2024-02-14 MED ORDER — ONABOTULINUMTOXINA 100 UNITS IJ SOLR: 300.0000 [IU] | Freq: Once | INTRAMUSCULAR | 3 refills | Status: AC

## 2024-02-14 NOTE — Telephone Encounter (Signed)
 Auth was approved, please send rx to [facility].  Auth#: [id number] (02/13/24-05/15/24)

## 2024-02-14 NOTE — Telephone Encounter (Signed)
 refilled

## 2024-02-14 NOTE — Addendum Note (Signed)
 Addended by: ONEITA NEVELYN BRAVO on: 02/14/2024 08:28 AM   Modules accepted: Orders

## 2024-03-05 ENCOUNTER — Ambulatory Visit: Admitting: Licensed Clinical Social Worker

## 2024-03-07 ENCOUNTER — Encounter: Payer: Self-pay | Admitting: Neurology

## 2024-03-07 ENCOUNTER — Ambulatory Visit: Admitting: Neurology

## 2024-03-07 VITALS — BP 143/83 | HR 95

## 2024-03-07 DIAGNOSIS — I69954 Hemiplegia and hemiparesis following unspecified cerebrovascular disease affecting left non-dominant side: Secondary | ICD-10-CM | POA: Diagnosis not present

## 2024-03-07 MED ORDER — ONABOTULINUMTOXINA 100 UNITS IJ SOLR: 300.0000 [IU] | Freq: Once | INTRAMUSCULAR | Status: AC

## 2024-03-07 NOTE — Progress Notes (Signed)
 Botox - 100 units x 3 vials Lot: [id number] Expiration:07/2025 NDC: 9976-8854-98  Bacteriostatic 0.9% Sodium Chloride - 6 mL  Lot: [id number] Expiration: OCT-31-2026 NDC: 9590-8033-97  Dx:I69.954 S/P Witnessed by: HaleighFreeman,CMA

## 2024-03-13 NOTE — Progress Notes (Signed)
 ASSESSMENT AND PLAN  Ruth Gregory is a 63 y.o. female   History of right internal carotid artery dissection from severe vomiting related to migraine headache, right MCA stroke in June 2009 Residual spastic left hemiparesis, receiving Botox  injection every 3 months  Electrical simulation guided Botox  injection for spastic left hemiparesis, uses 300 units  Left pectoralis major 50 units Left latissimus dorsi 50 units Left pronator teres 25 units Left brachialis 25 units Left palmaris longus 25 units Left flexor digitorum profundus right 25 units   Left tibialis posterior 50 units Left flexor digitorum longus 50 units   DIAGNOSTIC DATA (LABS, IMAGING, TESTING) - I reviewed patient records, labs, notes, testing and imaging myself where available. HISTORICAL  Ruth Gregory is a 63 year old right-handed female  History of right internal carotid section with spastic left hemiparesis, Botox  injection every 3 months since Nov 2010 She has history of right internal carotid artery dissection following an episode of vomiting from migraine headache in mid June 2009. She was given IV TPA and found to have right MCA occlusion. She underwent emergent right carotid artery stent with distal endovascular recanalization of the middle cerebral artery.  She had small hemorrhagic transformation of the right basal ganglia with mild left hemiparesis, but has done well since then and has been independent.  She used to work as a research scientist (medical) in conservator, museum/gallery.  She has made marked recovery, ambulating only with very mild difficulty,  maintain majority of her left arm function,  there is mild limitation in the range of motion in her left shoulder,  mild left shoulder stiffness and pain, most bothersome symptoms is her left elbow discomfort, left wrist achy pain, difficulty releasing left hand flexion, tendency for left arm persistent flexion,  left ankle plantar inversion, increased gait  difficulty after prolonged walking, This has all been helped by Botox  injection.  She has been receiving BOTOX  injection since 01/2009 every 3 months, to her left upper extremity and also left lower extremity, responded well.   Repeat US  carotid were normal in October 2013  Chronic migraine headaches: She had long-standing history of chronic migraine, average 2-4 headaches each months, she has increased frequency headaches around fall, and spring,    She is not candidate for triptan because previous history of stroke, her migraine responding well to Ubrelvy  as needed  Sometimes she received Botox  injection as migraine prevention,    Since May 2022, she reported recurrent 2 episode of sudden onset transient confusion, most recent episode was on January 18, 2021, while driving, she began to turn towards the left side, had transient confusion, visual distortion, likely there was no incident, suspicious for partial seizure   EEG in November 2022 showed intermittent protrusion of slower wave dysrhythmic involving right frontotemporal region, no epileptiform discharge, but suggest focal irritability, consistent with her history of right MCA stroke  She also complains of mild memory loss, difficulty focusing, difficulty multitasking,  She was started on lamotrigine  titrating dose 100 mg twice a day since beginning of 2023, tolerating it well, also stabilize her mood,  Today she is accompanied by her husband for evaluation of new onset left arm deep achy pain, for 1 month, concurrent with worsening neck pain, radiating tension to bilateral shoulder, she also complains patch of numbness at the dorsum of left hand,   EMG nerve conduction study today September 21, 2023 showed mild right carpal tunnel syndromes, chronic left C7 radiculopathy, no active process  UPDATE November 16 2023: She responded  well to previous injection, but has left fifth finger weakness  Update March 07, 2024 She is delayed on  her injection, noticed a worsening left hand and foot spasticity, Her migraine overall is under good control  PHYSICAL EXAM      03/07/2024    3:37 PM 01/24/2024    1:07 PM 12/16/2023    1:52 PM  Vitals with BMI  Height  5' 7.75 5' 9  Weight  151 lbs 13 oz 153 lbs  BMI  23.25 22.58  Systolic 143 132 879  Diastolic 83 88 74  Pulse 95 75 90      Gen: NAD, conversant, well nourised, well groomed      NEUROLOGICAL EXAM:  MENTAL STATUS: Speech/Cognition: Awake, alert, normal speech, oriented to history taking and casual conversation.  CRANIAL NERVES: CN II: Visual fields are full to confrontation.  Pupils are round equal and briskly reactive to light. CN III, IV, VI: extraocular movement are normal. No ptosis. CN V: Facial sensation is intact to light touch. CN VII: Face is symmetric with normal eye closure and smile. CN VIII: Hearing is normal to casual conversation CN IX, X: Palate elevates symmetrically. Phonation is normal. CN XI: Head turning and shoulder shrug are intact  MOTOR: Mild spastic left hemiparesis, tendency for left finger flexion, left arm pronation and left ankle plantarflexion  Deep tendon reflex: Mild hyperreflexia on the left side  Sensory: Intact to light touch  COORDINATION: There is no trunk or limb ataxia.    GAIT/STANCE: Mildly unsteady gait, tendency for left ankle plantarflexion  Review of system: Pertinent as above   ALLERGIES: Allergies  Allergen Reactions   Penicillins Anaphylaxis and Hives   Imitrex [Sumatriptan]    Other Swelling    kiwi   Sulfa Antibiotics Hives   Zomig [Zolmitriptan]     HOME MEDICATIONS: Current Outpatient Medications  Medication Sig Dispense Refill   albuterol  (PROVENTIL  HFA) 108 (90 Base) MCG/ACT inhaler 1 to 2 inhalations 10-15 minutes apart every 4 hours if needed for asthma rescue 48 g 3   ALPRAZolam  (XANAX ) 0.5 MG tablet Take 1/2-1 tablet at Bedtime ONLY if needed for Sleep 30 tablet 0    amphetamine -dextroamphetamine  (ADDERALL) 10 MG tablet Take 1/2 to 1 tablet 1 or 2 x daily for ADD 60 tablet 0   aspirin  EC 81 MG tablet Take 81 mg by mouth daily.     augmented betamethasone dipropionate (DIPROLENE-AF) 0.05 % cream Apply topically 2 (two) times daily as needed.     Botulinum Toxin Type A  (BOTOX ) 200 UNITS SOLR Inject 400 Units as directed every 3 (three) months. Change to BOTOX  A 100  units     BREO ELLIPTA  100-25 MCG/INH AEPB 1 INHALATION DAILY TO PREVENT ASTHMA 180 each 3   buPROPion  (WELLBUTRIN  XL) 150 MG 24 hr tablet TAKE 1 TABLET EVERY MORNING FOR MOOD. FOCUS & CONCENTRATION 30 tablet 2   butalbital -acetaminophen -caffeine  (FIORICET) 50-325-40 MG tablet Take 1 tablet by mouth daily as needed for headache. Do not refill in less than 30 days, 12 tablet 5   cetirizine (ZYRTEC) 10 MG tablet Take 10 mg by mouth daily.     dexamethasone  (DECADRON ) 4 MG tablet Take 1 tab 3 x day - 3 days, then 2 x day - 3 days, then 1 tab daily 20 tablet 0   Diclofenac  Potassium,Migraine, (CAMBIA ) 50 MG PACK MIX 1 PACK WITH 1-2 OUNCES OF WATER AND DRINK AT ONSET OF MIGRAINE CAN REPEAT IN 2 HOURS IF NEEDED MAX  2/24 HOURS 8 each 11   diclofenac  Sodium (VOLTAREN ) 1 % GEL Apply 4 g topically 4 (four) times daily as needed. 500 g 6   Estradiol  (YUVAFEM ) 10 MCG TABS vaginal tablet Place 10 mcg vaginally 2 (two) times a week.     fenofibrate  micronized (LOFIBRA) 134 MG capsule TAKE 1 CAPSULE EVERY DAY 90 capsule 3   levothyroxine  (SYNTHROID ) 88 MCG tablet Take 1 tablet daily on an empty stomach with only water for 30 minutes & no Antacid meds, Calcium  or Magnesium for 4 hours & avoid Biotin 90 tablet 0   magnesium oxide (MAG-OX) 400 MG tablet Take 400 mg by mouth 2 (two) times daily.      montelukast  (SINGULAIR ) 10 MG tablet Take 1 tablet Daily  for Allergies. 90 tablet 3   Multiple Vitamin (MULTIVITAMIN WITH MINERALS) TABS tablet Take 1 tablet by mouth daily.     Multiple Vitamins-Minerals (ZINC PO) Take 1  tablet by mouth daily.     nortriptyline  (PAMELOR ) 25 MG capsule Take 25 mg by mouth at bedtime.     omeprazole  (PRILOSEC) 40 MG capsule TAKE 1 CAPSULE DAILY FOR ACID INDIGESTION & REFLUX 90 capsule 3   ondansetron  (ZOFRAN  ODT) 8 MG disintegrating tablet Dissolve 1 tablet under tongue every 6 to 8 hours for nausea  or vomitting 30 tablet 0   Riboflavin 100 MG TABS Take 100 mg by mouth. bid     rosuvastatin  (CRESTOR ) 40 MG tablet Take 1 tablet (40 mg total) by mouth daily. Take 1 tablet Daily for Cholesterol 90 tablet 3   topiramate  (TOPAMAX ) 100 MG tablet Take 2 tablets (200 mg total) by mouth daily. 180 tablet 3   verapamil  (CALAN -SR) 240 MG CR tablet TAKE 1 TABLET DAILY WITH FOOD FOR BP & MIGRAINE PREVENTION 90 tablet 3   vitamin B-12 (CYANOCOBALAMIN ) 1000 MCG tablet Take 1,000 mcg by mouth daily.     Vitamin D , Ergocalciferol , (DRISDOL ) 1.25 MG (50000 UT) CAPS capsule Take 1 capsule daily for Vitamin D  deficiency. 90 capsule 1   Erenumab -aooe (AIMOVIG ) 70 MG/ML SOAJ Inject 70 mg into the skin every 30 (thirty) days. 1 mL 11   Ubrogepant  (UBRELVY ) 50 MG TABS Take 50 mg by mouth as needed. May repeat once in 2 hours 12 tablet 11   No current facility-administered medications for this visit.    PAST MEDICAL HISTORY: Past Medical History:  Diagnosis Date   Anemia    Anxiety    Asthma    Chronic heartburn    Depression    Elevated cholesterol    on Crestor    Headache(784.0)    Hypertension    Hypothyroidism    S/P thyroidectomy   Migraine    Occlusion and stenosis of carotid artery with cerebral infarction    Spasm of muscle    Unspecified cerebral artery occlusion with cerebral infarction    Vitamin D  deficiency     PAST SURGICAL HISTORY: Past Surgical History:  Procedure Laterality Date   AUGMENTATION MAMMAPLASTY Bilateral 2000   COSMETIC SURGERY     CYSTOCELE REPAIR N/A 01/08/2013   Procedure: ANTERIOR REPAIR (CYSTOCELE);  Surgeon: Charlie CHRISTELLA Croak, MD;  Location: [facility];   Service: Gynecology;  Laterality: N/A;   NASAL SINUS SURGERY     VAGINAL HYSTERECTOMY      FAMILY HISTORY: Family History  Problem Relation Age of Onset   Heart disease Mother    Peptic Ulcer Disease Mother    Heart disease Father    Parkinson's disease  Father     SOCIAL HISTORY:  Social History   Socioeconomic History   Marital status: Married    Spouse name: Marcey   Number of children: 2   Years of education: college   Highest education level: Not on file  Occupational History   Occupation: DESIGNER    Employer: SEL[employer]  Tobacco Use   Smoking status: Never Smoker   Smokeless tobacco: Never Used  Substance and Sexual Activity   Alcohol use: Yes    Comment: occassionally drinks a glass of wine   Drug use: No   Sexual activity: Not on file  Other Topics Concern   Not on file  Social History Narrative   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 Patient is married and lives with her husband. Patient has college education.   Right handed.   Caffeine - two cups daily.   Social Determinants of Health   Financial Resource Strain:    Difficulty of Paying Living Expenses: Not on file  Food Insecurity:    Worried About Programme Researcher, Broadcasting/film/video in the Last Year: Not on file   [employer] of Food in the Last Year: Not on file  Transportation Needs:    Lack of Transportation (Medical): Not on file   Lack of Transportation (Non-Medical): Not on file  Physical Activity:    Days of Exercise per Week: Not on file   Minutes of Exercise per Session: Not on file  Stress:    Feeling of Stress : Not on file  Social Connections:    Frequency of Communication with Friends and Family: Not on file   Frequency of Social Gatherings with Friends and Family: Not on file   Attends Religious Services: Not on file   Active Member of Clubs or Organizations: Not on file   Attends Banker Meetings: Not on file   Marital Status: Not on file  Intimate Partner Violence:     Fear of Current or Ex-Partner: Not on file   Emotionally Abused: Not on file   Physically Abused: Not on file   Sexually Abused: Not on file      Modena Callander, M.D. Ph.D.  [facility] [address], [address] [place], [place] [address] Ph: [phone] Fax: [phone]

## 2024-03-17 ENCOUNTER — Other Ambulatory Visit: Payer: Self-pay | Admitting: Neurology

## 2024-03-19 NOTE — Telephone Encounter (Signed)
 Requested Prescriptions   Pending Prescriptions Disp Refills   butalbital -acetaminophen -caffeine  (FIORICET) 50-325-40 MG tablet [Pharmacy Med Name: BUTALB-ACETAMIN-CAFF 50-325-40] 12 tablet 4    Sig: TAKE 1 TABLET BY MOUTH DAILY AS NEEDED FOR HEADACHE. DO NOT REFILL IN LESS THAN 30 DAYS,   Last appt 03/07/24 for injection visit with dr. Onita Next appt 06/06/24  Dispenses   Dispensed Days Supply Quantity Provider Pharmacy  BUTALB-ACETAMIN-CAFF [phone] 02/16/2024 30 12 each Onita Duos, MD [phone] - [facility]...  BUTALB-ACETAMIN-CAFF 50-325-40 01/14/2024 30 12 each Onita Duos, MD [facility] [phone] - S...  BUTALB-ACETAMIN-CAFF [id number] 12/02/2023 30 12 each Onita Duos, MD [facility] [phone] - S...  BUTALB-ACETAMIN-CAFF 50-325-40 10/28/2023 30 12 each Onita Duos, MD [facility] [phone] - S...  BUTALB-ACETAMIN-CAFF 50-325-40 09/19/2023 30 12 each Onita Duos, MD [facility] [phone] - S...  BUTALB-ACETAMIN-CAFF 50-325-40 08/18/2023 30 12 each Onita Duos, MD [facility] [phone] - S...  BUTALB-ACETAMIN-CAFF 50-325-40 06/29/2023 30 12 each Onita Duos, MD [facility] [phone] - S...  BUTALB-ACETAMIN-CAFF 50-325-40 06/06/2023 30 12 each Onita Duos, MD [facility] [phone] - S...  BUTALB-ACETAMIN-CAFF 50-325-40 05/10/2023 30 12 each Onita Duos, MD [facility] [phone] - S...  BUTALB-ACETAMIN-CAFF 50-325-40 04/14/2023 30 12 each Onita Duos, MD [facility] [phone] - [facility].[place]

## 2024-04-02 ENCOUNTER — Ambulatory Visit (INDEPENDENT_AMBULATORY_CARE_PROVIDER_SITE_OTHER): Admitting: Licensed Clinical Social Worker

## 2024-04-02 DIAGNOSIS — F411 Generalized anxiety disorder: Secondary | ICD-10-CM

## 2024-04-02 NOTE — Progress Notes (Signed)
 [facility] Counselor/Therapist Progress Note  Patient ID: Ruth Gregory, MRN: [id number]    Date: 04/02/2024  Time Spent: 1004  am - 1100 am : 56 Minutes  Treatment Type: Individual Therapy.  Treatment Type: Individual Therapy.   Reported Symptoms: Patient reports that she had a stroke in 2009. She has a high pressure job and they were paying for her disability. 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 Patient is concerned about her emotional stability and her disability. Patient reports that she is secluded by being home so much. She often pushes herself to do things.    Mental Status Exam: Appearance:  Casual     Behavior: Appropriate  Motor: Normal  Speech/Language:  Clear and Coherent  Affect: Appropriate  Mood: normal  Thought process: flight of ideas  Thought content:   WNL  Sensory/Perceptual disturbances:   WNL  Orientation: oriented to person, place, time/date, situation, day of week, month of year, and year  Attention: Good  Concentration: Good  Memory: WNL  Fund of knowledge:  Good  Insight:   Fair  Judgment:  Fair  Impulse Control: Fair    Risk Assessment: Danger to Self:  No Self-injurious Behavior: No Danger to Others: No Duty to Warn:no Physical Aggression / Violence:No  Access to Firearms a concern: No  Gang Involvement:No    Subjective:    Ruth Gregory participated in person from [facility] with Clinician present. Patient  consented to treatment.   Ruth Gregory presented for her session in a positive mood. Ruth Gregory states that she is happy that her boys finally made amends. Ruth Gregory stated that they shared a wonderful Thanksgiving and Christmas Holiday. She reports that her daughter in law was not very happy. However, in the end she was accepting. Ruth Gregory reports that  She hosted 11 people for the holidays. Ruth Gregory reports that she was grateful that her children were getting along. Ruth Gregory was open about her stroke and  shared some details about the event.   Clinician actively listened and provided support via active listening and verbal feedback. Clinician processed with patient her concerns and assisted patient in identifying ideas for improving her overall mental health. We discussed problem solving with her anxiety. Clinician encouraged patient to continue to remain aware of her physical health.Clinician and patient processed that she must be proactive with her health and not be afraid of being transparent.  Patient reports that her health and her frustration over her  situation adds to her current stress and anxiety and processed with Clinician her desire to be more mindful.   Ruth Gregory was engaged and was polite and cooperative. Patient enjoys sharing and is eager to receive feedback. Ruth Gregory will continue to utilize grounding, deep breathing and reframing to decrease her symptoms of anxiety. Patient is to use CBT, mindfulness and coping skills to help manage decrease symptoms associated with her diagnosis.Treatment planning to be reviewed by 11/20/2024.   Interventions: Cognitive Behavioral Therapy, Dialectical Behavioral Therapy, Assertiveness/Communication, Mindfulness Meditation, and Solution-Oriented/Positive Psychology   Diagnosis: Generalized Anxiety Disorder       Damien Junk MSW, LCSW/DATE 04/02/2024

## 2024-04-05 NOTE — Telephone Encounter (Unsigned)
 Copied from CRM [id number]. Topic: Clinical - Medication Refill >> Apr 05, 2024 10:04 AM Olam RAMAN wrote: Medication: amphetamine -dextroamphetamine  (ADDERALL) 10 MG tablet   Has the patient contacted their pharmacy? No (Agent: If no, request that the patient contact the pharmacy for the refill. If patient does not wish to contact the pharmacy document the reason why and proceed with request.) (Agent: If yes, when and what did the pharmacy advise?)  This is the patient's preferred pharmacy:  [id number] [place], [place] - [address] [place] [address] Phone: [phone] Fax: [phone]  [facility] - [place], [place] - [address] [place] [address] Phone: [phone] Fax: [phone]  Is this the correct pharmacy for this prescription? Yes If no, delete pharmacy and type the correct one.   Has the prescription been filled recently? Yes  Is the patient out of the medication? Yes  Has the patient been seen for an appointment in the last year OR does the patient have an upcoming appointment? Yes  Can we respond through MyChart? Yes  Agent: Please be advised that Rx refills may take up to 3 business days. We ask that you follow-up with your pharmacy.

## 2024-04-06 ENCOUNTER — Telehealth: Payer: Self-pay

## 2024-04-06 ENCOUNTER — Other Ambulatory Visit: Payer: Self-pay | Admitting: Family Medicine

## 2024-04-06 DIAGNOSIS — E559 Vitamin D deficiency, unspecified: Secondary | ICD-10-CM

## 2024-04-06 DIAGNOSIS — F988 Other specified behavioral and emotional disorders with onset usually occurring in childhood and adolescence: Secondary | ICD-10-CM

## 2024-04-06 MED ORDER — AMPHETAMINE-DEXTROAMPHETAMINE 10 MG PO TABS: ORAL_TABLET | ORAL | 0 refills | Status: AC

## 2024-04-06 NOTE — Telephone Encounter (Signed)
 Script sent

## 2024-04-06 NOTE — Telephone Encounter (Signed)
 Copied from CRM [id number]. Topic: Clinical - Prescription Issue >> Apr 06, 2024 10:42 AM [name] wrote: Reason for CRM: Patient calling to check status of refill request for her amphetamine -dextroamphetamine  (ADDERALL) 10 MG tablet, request submitted on 1/8 and medication shows discontinued by Dr. Ozell on 02/09/2024. Refill request also opens up under Neurology and showing Dr. Onita in CRM not sure about that. Patient states she's out and [facility]  Ruth Gregory [phone] >> Apr 06, 2024 11:14 AM Nurse Lauraine BROCKS, RN wrote: Per Chart review, last fill per Dr. Ozell

## 2024-04-06 NOTE — Addendum Note (Signed)
 Addended by: OZELL HERON HERO on: 04/06/2024 12:14 PM   Modules accepted: Orders

## 2024-04-17 ENCOUNTER — Ambulatory Visit: Admitting: Licensed Clinical Social Worker

## 2024-05-09 ENCOUNTER — Ambulatory Visit: Admitting: Licensed Clinical Social Worker

## 2024-06-06 ENCOUNTER — Ambulatory Visit: Admitting: Neurology
